# Patient Record
Sex: Male | Born: 1950
Health system: Southern US, Community
[De-identification: ages and names within clinical notes are randomized; demographics above are authoritative.]

## PROBLEM LIST (undated history)

## (undated) DIAGNOSIS — E278 Other specified disorders of adrenal gland: Secondary | ICD-10-CM

## (undated) DIAGNOSIS — T7840XA Allergy, unspecified, initial encounter: Secondary | ICD-10-CM

## (undated) DIAGNOSIS — E269 Hyperaldosteronism, unspecified: Secondary | ICD-10-CM

## (undated) DIAGNOSIS — E785 Hyperlipidemia, unspecified: Secondary | ICD-10-CM

## (undated) DIAGNOSIS — E119 Type 2 diabetes mellitus without complications: Secondary | ICD-10-CM

## (undated) DIAGNOSIS — F32A Depression, unspecified: Secondary | ICD-10-CM

## (undated) DIAGNOSIS — I5189 Other ill-defined heart diseases: Secondary | ICD-10-CM

## (undated) DIAGNOSIS — N183 Chronic kidney disease, stage 3 unspecified: Secondary | ICD-10-CM

## (undated) DIAGNOSIS — F329 Major depressive disorder, single episode, unspecified: Secondary | ICD-10-CM

## (undated) DIAGNOSIS — F419 Anxiety disorder, unspecified: Secondary | ICD-10-CM

## (undated) DIAGNOSIS — I471 Supraventricular tachycardia, unspecified: Secondary | ICD-10-CM

## (undated) DIAGNOSIS — Z87442 Personal history of urinary calculi: Secondary | ICD-10-CM

## (undated) DIAGNOSIS — K219 Gastro-esophageal reflux disease without esophagitis: Secondary | ICD-10-CM

## (undated) DIAGNOSIS — M109 Gout, unspecified: Secondary | ICD-10-CM

## (undated) DIAGNOSIS — N189 Chronic kidney disease, unspecified: Secondary | ICD-10-CM

## (undated) DIAGNOSIS — G4730 Sleep apnea, unspecified: Secondary | ICD-10-CM

## (undated) DIAGNOSIS — I10 Essential (primary) hypertension: Secondary | ICD-10-CM

## (undated) DIAGNOSIS — C801 Malignant (primary) neoplasm, unspecified: Secondary | ICD-10-CM

## (undated) DIAGNOSIS — R55 Syncope and collapse: Secondary | ICD-10-CM

## (undated) DIAGNOSIS — I872 Venous insufficiency (chronic) (peripheral): Secondary | ICD-10-CM

## (undated) DIAGNOSIS — M1990 Unspecified osteoarthritis, unspecified site: Secondary | ICD-10-CM

## (undated) DIAGNOSIS — Z9289 Personal history of other medical treatment: Secondary | ICD-10-CM

## (undated) DIAGNOSIS — J189 Pneumonia, unspecified organism: Secondary | ICD-10-CM

## (undated) HISTORY — PX: ARTHROSCOPIC REPAIR ACL: SUR80

## (undated) HISTORY — DX: Personal history of other medical treatment: Z92.89

## (undated) HISTORY — DX: Syncope and collapse: R55

## (undated) HISTORY — DX: Chronic kidney disease, unspecified: N18.9

## (undated) HISTORY — DX: Venous insufficiency (chronic) (peripheral): I87.2

## (undated) HISTORY — DX: Supraventricular tachycardia, unspecified: I47.10

## (undated) HISTORY — DX: Allergy, unspecified, initial encounter: T78.40XA

## (undated) HISTORY — DX: Hyperlipidemia, unspecified: E78.5

## (undated) HISTORY — PX: MOHS SURGERY: SUR867

## (undated) HISTORY — DX: Essential (primary) hypertension: I10

## (undated) HISTORY — DX: Type 2 diabetes mellitus without complications: E11.9

## (undated) HISTORY — PX: HERNIA REPAIR: SHX51

## (undated) HISTORY — DX: Other ill-defined heart diseases: I51.89

## (undated) HISTORY — PX: VASECTOMY: SHX75

## (undated) HISTORY — DX: Hyperaldosteronism, unspecified: E26.9

## (undated) HISTORY — DX: Major depressive disorder, single episode, unspecified: F32.9

## (undated) HISTORY — DX: Unspecified osteoarthritis, unspecified site: M19.90

## (undated) HISTORY — PX: JOINT REPLACEMENT: SHX530

## (undated) HISTORY — DX: Depression, unspecified: F32.A

---

## 2010-12-28 ENCOUNTER — Inpatient Hospital Stay: Payer: Self-pay | Admitting: Internal Medicine

## 2011-07-12 ENCOUNTER — Ambulatory Visit: Payer: Self-pay | Admitting: Family Medicine

## 2011-08-02 ENCOUNTER — Ambulatory Visit: Payer: Self-pay | Admitting: Family Medicine

## 2011-09-02 ENCOUNTER — Ambulatory Visit: Payer: Self-pay | Admitting: Family Medicine

## 2011-10-02 ENCOUNTER — Ambulatory Visit: Payer: Self-pay | Admitting: Family Medicine

## 2012-02-02 ENCOUNTER — Ambulatory Visit: Payer: Self-pay | Admitting: Gastroenterology

## 2012-09-18 ENCOUNTER — Ambulatory Visit: Payer: Self-pay | Admitting: Family Medicine

## 2012-12-11 ENCOUNTER — Ambulatory Visit: Payer: Self-pay | Admitting: Family Medicine

## 2013-04-03 DIAGNOSIS — C4491 Basal cell carcinoma of skin, unspecified: Secondary | ICD-10-CM

## 2013-04-03 HISTORY — DX: Basal cell carcinoma of skin, unspecified: C44.91

## 2013-12-24 DIAGNOSIS — Z85828 Personal history of other malignant neoplasm of skin: Secondary | ICD-10-CM | POA: Insufficient documentation

## 2014-04-22 DIAGNOSIS — E119 Type 2 diabetes mellitus without complications: Secondary | ICD-10-CM | POA: Diagnosis not present

## 2014-07-04 DIAGNOSIS — Z23 Encounter for immunization: Secondary | ICD-10-CM | POA: Insufficient documentation

## 2014-07-04 DIAGNOSIS — R05 Cough: Secondary | ICD-10-CM | POA: Insufficient documentation

## 2014-07-04 DIAGNOSIS — M109 Gout, unspecified: Secondary | ICD-10-CM | POA: Insufficient documentation

## 2014-07-04 DIAGNOSIS — E669 Obesity, unspecified: Secondary | ICD-10-CM

## 2014-07-04 DIAGNOSIS — F329 Major depressive disorder, single episode, unspecified: Secondary | ICD-10-CM | POA: Insufficient documentation

## 2014-07-04 DIAGNOSIS — F32A Depression, unspecified: Secondary | ICD-10-CM | POA: Insufficient documentation

## 2014-07-04 DIAGNOSIS — J309 Allergic rhinitis, unspecified: Secondary | ICD-10-CM | POA: Insufficient documentation

## 2014-07-04 DIAGNOSIS — K130 Diseases of lips: Secondary | ICD-10-CM | POA: Insufficient documentation

## 2014-07-04 DIAGNOSIS — I10 Essential (primary) hypertension: Secondary | ICD-10-CM | POA: Insufficient documentation

## 2014-07-04 DIAGNOSIS — R300 Dysuria: Secondary | ICD-10-CM | POA: Insufficient documentation

## 2014-07-04 DIAGNOSIS — M1990 Unspecified osteoarthritis, unspecified site: Secondary | ICD-10-CM | POA: Insufficient documentation

## 2014-07-04 DIAGNOSIS — E876 Hypokalemia: Secondary | ICD-10-CM | POA: Insufficient documentation

## 2014-07-04 DIAGNOSIS — R252 Cramp and spasm: Secondary | ICD-10-CM | POA: Insufficient documentation

## 2014-07-04 DIAGNOSIS — E7800 Pure hypercholesterolemia, unspecified: Secondary | ICD-10-CM | POA: Insufficient documentation

## 2014-07-04 DIAGNOSIS — M1710 Unilateral primary osteoarthritis, unspecified knee: Secondary | ICD-10-CM | POA: Insufficient documentation

## 2014-07-04 DIAGNOSIS — B309 Viral conjunctivitis, unspecified: Secondary | ICD-10-CM | POA: Insufficient documentation

## 2014-07-04 DIAGNOSIS — N390 Urinary tract infection, site not specified: Secondary | ICD-10-CM | POA: Insufficient documentation

## 2014-07-04 DIAGNOSIS — M2550 Pain in unspecified joint: Secondary | ICD-10-CM | POA: Insufficient documentation

## 2014-07-04 DIAGNOSIS — F1120 Opioid dependence, uncomplicated: Secondary | ICD-10-CM | POA: Insufficient documentation

## 2014-07-04 DIAGNOSIS — R509 Fever, unspecified: Secondary | ICD-10-CM | POA: Insufficient documentation

## 2014-07-04 DIAGNOSIS — R5381 Other malaise: Secondary | ICD-10-CM | POA: Insufficient documentation

## 2014-07-04 DIAGNOSIS — R5383 Other fatigue: Secondary | ICD-10-CM

## 2014-07-04 DIAGNOSIS — F419 Anxiety disorder, unspecified: Secondary | ICD-10-CM | POA: Insufficient documentation

## 2014-07-04 DIAGNOSIS — J209 Acute bronchitis, unspecified: Secondary | ICD-10-CM | POA: Insufficient documentation

## 2014-07-04 DIAGNOSIS — T148XXA Other injury of unspecified body region, initial encounter: Secondary | ICD-10-CM | POA: Insufficient documentation

## 2014-07-04 DIAGNOSIS — M5430 Sciatica, unspecified side: Secondary | ICD-10-CM | POA: Insufficient documentation

## 2014-07-04 DIAGNOSIS — R42 Dizziness and giddiness: Secondary | ICD-10-CM | POA: Insufficient documentation

## 2014-07-04 DIAGNOSIS — R310 Gross hematuria: Secondary | ICD-10-CM | POA: Insufficient documentation

## 2014-07-04 DIAGNOSIS — E1169 Type 2 diabetes mellitus with other specified complication: Secondary | ICD-10-CM | POA: Insufficient documentation

## 2014-07-04 DIAGNOSIS — M5136 Other intervertebral disc degeneration, lumbar region: Secondary | ICD-10-CM | POA: Insufficient documentation

## 2014-07-04 DIAGNOSIS — R053 Chronic cough: Secondary | ICD-10-CM | POA: Insufficient documentation

## 2014-07-04 DIAGNOSIS — T50905A Adverse effect of unspecified drugs, medicaments and biological substances, initial encounter: Secondary | ICD-10-CM

## 2014-07-24 ENCOUNTER — Ambulatory Visit
Admit: 2014-07-24 | Disposition: A | Discharge status: Home / Self Care | Payer: Self-pay | Attending: Urology | Admitting: Urology

## 2014-08-10 ENCOUNTER — Encounter
Admission: RE | Admit: 2014-08-10 | Discharge: 2014-08-10 | Disposition: A | Discharge status: Home / Self Care | Payer: Medicare PPO | Source: Ambulatory Visit | Attending: Urology | Admitting: Urology

## 2014-08-10 DIAGNOSIS — M1990 Unspecified osteoarthritis, unspecified site: Secondary | ICD-10-CM | POA: Insufficient documentation

## 2014-08-10 DIAGNOSIS — F329 Major depressive disorder, single episode, unspecified: Secondary | ICD-10-CM | POA: Diagnosis not present

## 2014-08-10 DIAGNOSIS — C009 Malignant neoplasm of lip, unspecified: Secondary | ICD-10-CM | POA: Diagnosis not present

## 2014-08-10 DIAGNOSIS — I10 Essential (primary) hypertension: Secondary | ICD-10-CM | POA: Diagnosis not present

## 2014-08-10 DIAGNOSIS — Z01812 Encounter for preprocedural laboratory examination: Secondary | ICD-10-CM | POA: Diagnosis not present

## 2014-08-10 DIAGNOSIS — E785 Hyperlipidemia, unspecified: Secondary | ICD-10-CM | POA: Diagnosis not present

## 2014-08-10 DIAGNOSIS — Z01810 Encounter for preprocedural cardiovascular examination: Secondary | ICD-10-CM | POA: Diagnosis not present

## 2014-08-10 DIAGNOSIS — E876 Hypokalemia: Secondary | ICD-10-CM | POA: Insufficient documentation

## 2014-08-10 HISTORY — DX: Gout, unspecified: M10.9

## 2014-08-10 HISTORY — DX: Anxiety disorder, unspecified: F41.9

## 2014-08-10 HISTORY — DX: Malignant (primary) neoplasm, unspecified: C80.1

## 2014-08-10 LAB — CBC
HCT: 43 % (ref 40.0–52.0)
Hemoglobin: 14.4 g/dL (ref 13.0–18.0)
MCH: 31.3 pg (ref 26.0–34.0)
MCHC: 33.5 g/dL (ref 32.0–36.0)
MCV: 93.3 fL (ref 80.0–100.0)
PLATELETS: 192 10*3/uL (ref 150–440)
RBC: 4.61 MIL/uL (ref 4.40–5.90)
RDW: 13.8 % (ref 11.5–14.5)
WBC: 6.3 10*3/uL (ref 3.8–10.6)

## 2014-08-10 LAB — BASIC METABOLIC PANEL
ANION GAP: 9 (ref 5–15)
BUN: 18 mg/dL (ref 6–20)
CHLORIDE: 103 mmol/L (ref 101–111)
CO2: 29 mmol/L (ref 22–32)
Calcium: 9.3 mg/dL (ref 8.9–10.3)
Creatinine, Ser: 0.75 mg/dL (ref 0.61–1.24)
GFR calc non Af Amer: >60 mL/min (ref >60)
Glucose, Bld: 224 mg/dL — ABNORMAL HIGH (ref 65–99)
Potassium: 3.6 mmol/L (ref 3.5–5.1)
SODIUM: 141 mmol/L (ref 135–145)

## 2014-08-10 LAB — DIFFERENTIAL
Basophils Absolute: 0 10*3/uL (ref 0–0.1)
Basophils Relative: 1 %
EOS PCT: 3 %
Eosinophils Absolute: 0.2 10*3/uL (ref 0–0.7)
LYMPHS PCT: 30 %
Lymphs Abs: 1.9 10*3/uL (ref 1.0–3.6)
MONO ABS: 0.4 10*3/uL (ref 0.2–1.0)
MONOS PCT: 6 %
NEUTROS ABS: 3.8 10*3/uL (ref 1.4–6.5)
Neutrophils Relative %: 60 %

## 2014-08-10 NOTE — Patient Instructions (Addendum)
  Your procedure is scheduled on:08/17/14 Report to Day Surgery. To find out your arrival time please call [phone] between 1PM - 3PM on 08/14/14.  Remember: Instructions that are not followed completely may result in serious medical risk, up to and including death, or upon the discretion of your surgeon and anesthesiologist your surgery may need to be rescheduled.    __x__ 1. Do not eat food or drink liquids after midnight. No gum chewing or hard candies.     _x_ 2. No Alcohol for 24 hours before or after surgery.   ____ 3. Bring all medications with you on the day of surgery if instructed.    __x__ 4. Notify your doctor if there is any change in your medical condition     (cold, fever, infections).     Do not wear jewelry, make-up, hairpins, clips or nail polish.  Do not wear lotions, powders, or perfumes. You may wear deodorant.  Do not shave 48 hours prior to surgery. Men may shave face and neck.  Do not bring valuables to the hospital.    [name] is not responsible for any belongings or valuables.               Contacts, dentures or bridgework may not be worn into surgery.  Leave your suitcase in the car. After surgery it may be brought to your room.  For patients admitted to the hospital, discharge time is determined by your                treatment team.   Patients discharged the day of surgery will not be allowed to drive home.   Please read over the following fact sheets that you were given:      ____ Take these medicines the morning of surgery with A SIP OF WATER:    1. metoprolol  2. amlodipine  3. fluoxetine  4.  5.  6.  ____ Fleet Enema (as directed)   ____ Use CHG Soap as directed  ____ Use inhalers on the day of surgery  _x___ Stop metformin 2 days prior to surgery    ____ Take 1/2 of usual insulin dose the night before surgery and none on the morning of surgery.   ____ Stop Coumadin/Plavix/aspirin on   _x___ Stop Anti-inflammatories on  now   ____ Stop supplements until after surgery.    ____ Bring C-Pap to the hospital.

## 2014-08-16 NOTE — H&P (Signed)
Evan Rivas. Evan Rivas 07/30/2014 7:42 AM Location: [facility] Patient #: [id number] DOB: 1950/06/30 Married / Language: Evan Rivas / Race: White Male    History of Present Illness(Shannon A Ernestine Conrad, PA-C; 07/30/2014 10:16 PM) The patient is a 64 year old male presenting to discuss diagnostic procedure results. The patient had a CT scan (CT Urogram). The diagnostic test was performed on - Date: (07/24/2014). Current symptoms include other (gross hematuria). There is no family history of breast cancer, cardiovascular disease, cystic fibrosis, Down's syndrome, mental retardation, myocardial infarction before age 13 or other. There is no medical history of anatomic urinary anomalies, asthma, cardiovascular disease, chest injury, chronic obstructive lung disease, congenital/chromosomal defects, coronary artery disease, emotional problems, hypertension, injury, lung disease, mental retardation, neurologic disorder, psychiatric illness or other. Note for "Follow up diagnostic procedure": REASON FOR EXAM: UROGRAM Hematuria work up Hematuria COMMENTS: PROCEDURE: KCT - KCT ABDOMEN/PELVIS W/WO - Jul 24 2014 8:48AM CLINICAL DATA: Gross painless hematuria for 2-3 weeks, decreased with antibiotics but with persistent microscopic hematuria EXAM: CT ABDOMEN AND PELVIS WITHOUT AND WITH CONTRAST TECHNIQUE: Multidetector CT imaging of the abdomen and pelvis was performed following the standard protocol before and following the bolus administration of intravenous contrast. Sagittal and coronal MPR images reconstructed from axial data set. CONTRAST: 125 cc Omnipaque 350 IV. Oral contrast not administered for this indication. COMPARISON: None FINDINGS: Subpleural atelectasis versus scarring at lung bases greater on RIGHT. Multiple BILATERAL nonobstructing renal calculi largest within RIGHT renal pelvis 8 mm diameter image 37. No ureteral calcification or dilatation. Small  cysts upper pole LEFT kidney 18 x 16 mm image 33 and lower pole 16 x 13 mm. Additional BILATERAL peripelvic renal cysts. Minimal dilatation of the RIGHT renal collecting system without ureteral dilatation. No enhancing renal mass lesion or collecting system filling defect. Bladder and ureters normal appearance. Liver, spleen, pancreas, and adrenal glands normal appearance. Normal appendix. LEFT inguinal hernia containing fat. Prior RIGHT inguinal herniorrhaphy. Stomach and bowel loops normal appearance for technique. No mass, adenopathy, free fluid, free air, or inflammatory process otherwise identified. No acute osseous findings. IMPRESSION: BILATERAL renal cysts. BILATERAL nonobstructing renal calculi. Minimal dilatation of the RIGHT renal collecting system without ureteral dilatation. LEFT inguinal hernia containing fat. Electronically Signed By: Lavonia Dana M.D.  I have reviewed the films with the patient.       Problem List/Past Medical(Sarah Watts; 07/30/2014 9:10 AM) Hypokalemia (276.8  E87.6) HTN (hypertension) (401.9  I10) Hematuria, gross (599.71  R31.0) Diabetes mellitus type 2 in obese (250.00  E11.9) Blues (311  F32.9) Skin cancer (173.90  C44.90) BPH with obstruction/lower urinary tract symptoms (600.01  N40.1) Obesity (BMI 30.0-34.9) (278.00  E66.9) Arthritis (716.90  M19.90) Sciatica (724.3  M54.30) Osteoarthritis of knee (715.36  M17.9) Pain, joint, multiple sites (719.49  M25.50) Conjunctivitis, viral (077.99  B30.9) Cough, persistent (786.2  R05) Other providers involved in care Malaise and fatigue (780.79  R53.81, R53.83) Muscle spasm (728.85  M62.838) Arthritis, degenerative (715.90  M19.90) Lip lesion (528.5  K13.0) Fever and chills (780.60  R50.9) Hypercholesteremia (272.0  E78.0) DDD (degenerative disc disease), lumbar (722.52  M51.36) Gout (274.9  M10.9) Anxiety (300.00  F41.1) Allergic rhinitis (477.9   J30.9) Biceps muscle tear (840.8  S46.119A) Chronic, continuous use of opioids (304.01  F11.20)    Allergies(Sarah Watts; 07/30/2014 9:10 AM) Tetanus Toxoid Adsorbed *TOXOIDS* CeleBREX *ANALGESICS - ANTI-INFLAMMATORY*. GI    Family History(Sarah Watts; 07/30/2014 9:10 AM) Stroke. Father. Sepsis. Mother.    Social History(Sarah Watts; 07/30/2014 9:10 AM)  Caffeine use Tobacco use. Never smoker. Alcohol use. Non-drinker.    Travel History(Sarah Watts; 07/30/2014 9:10 AM) Have you traveled internationally in the last 21 days?. No.    Medication History(Sarah Watts; 07/30/2014 9:10 AM) Tamsulosin HCl (0.4MG  Capsule, 1 (one) Capsule Oral one a day by mouth, Taken starting 07/09/2014) Active. Hydrocodone-Acetaminophen (10-325MG  Tablet, 1 Tablet Oral every eight hours, Taken starting 06/03/2014) Active. (Qty: 90; Medication taken as needed. Please fill on/after June 11, 2014) Losartan Potassium (100MG  Tablet, 1 Tablet Oral daily, Taken starting 06/03/2014) Active. (Qty: 90; Refills: 1) MetFORMIN HCl ER (500MG  Tablet ER 24HR, 2 Tablet ER 24HR Oral two times daily, Taken starting 06/03/2014) Active. (Qty: 360) Metoprolol Succinate ER (100MG  Tablet ER 24HR, 1 Tablet ER 24HR Oral daily, Taken starting 06/03/2014) Active. (Qty: 90; Refills: 1) Metoprolol Succinate ER (25MG  Tablet ER 24HR, 1 Tablet ER 24HR Oral daily, Taken starting 06/03/2014) Active. (Qty: 90) AmLODIPine Besylate (10MG  Tablet, 1 Tablet Oral daily, Taken starting 06/03/2014) Active. (Qty: 90) Atorvastatin Calcium (40MG  Tablet, 1 Tablet Oral at bedtime, Taken starting 06/03/2014) Active. (Qty: 90) Nabumetone (500MG  Tablet, 1 Tablet Oral two times daily, Taken starting 03/03/2014) Active. (Qty: 180; Refills: 1) Allopurinol (300MG  Tablet, 1 Tablet Oral daily, Taken starting 03/03/2014) Active. (Qty: 90; Refills: 1) ClonazePAM (1MG  Tablet, 1 Tablet Oral two times daily, Taken starting 03/03/2014) Active.  (Qty: 180; Refills: 1; Medication taken as needed.) FLUoxetine HCl (40MG  Capsule, 1 Capsule Oral daily, Taken starting 03/03/2014) Active. (Qty: 90; Refills: 1) Fluticasone Propionate (50MCG/ACT Suspension, 2 Spray(s) Nasal daily, Taken starting 05/03/2012) Active. (Qty: 3; Refills: 1) Aspirin EC (81MG  Tablet DR, 1 Oral daily) Active. TiZANidine HCl (4MG  Tablet, 1 Tablet Oral) Active. (Medication taken as needed. about once a month) Multivitamin ( Oral) Active. Medications Reconciled.    Past Surgical History(Sarah Watts; 07/30/2014 9:10 AM) KneeSurgery. 1995 Left. Vasectomy. 1998 Hernia Repair. 1998 Skin Cancer. 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 Normal. 1.0 ng/mL on 10/23/2011    Other Problems(Sarah Watts; 07/30/2014 9:10 AM) Influenza vaccine needed (V04.81  Z23) UTI (lower urinary tract infection) (599.0  N39.0) Acute bronchitis (466.0  J20.9) Dysuria (788.1  R30.0) Dizziness of unknown cause (780.4  R42)    Review of Systems(Sarah Watts; 07/30/2014 9:10 AM) General:Present- Fatigue. Not Present- Chills, Fever and Weight Gain > 10lbs.. Skin:Not Present- Hair Loss, Pruritus and Rash. HEENT:Not Present- Eye Pain, Decreased Hearing, Runny Nose, Snoring and Dry Mucous Membranes. Neck:Not Present- Neck Pain and Swollen Glands. Respiratory:Not Present- Chronic Cough, Difficulty Breathing on Exertion and Wakes up from Sleep Wheezing or Short of Breath. Breast:Not Present- Gynecomastia. Cardiovascular:Not Present- Edema, Palpitations and Shortness of Breath. Gastrointestinal:Not Present- Diarrhea, Nausea and Vomiting. Male Genitourinary:Present- Change in Urinary Stream, Frequency, Hematuria, Nocturia, Urgencyand Urine Leakage. Note:see HPI Musculoskeletal:Present- Back Pain and Joint Pain. Neurological:Not Present- Decreased Memory, Headaches and Seizures. Psychiatric:Present- Anxiety and  Depression. Endocrine:Not Present- Excessive Sweating, Heat Intolerance and Tired/Sluggish. Hematology:Not Present- Abnormal Bleeding, Anemia, Blood Transfusion and Enlarged Lymph Nodes.    Vitals(Sarah Watts; 07/30/2014 9:11 AM) 07/30/2014 9:10 AM Weight: 262.31 lb Height: 73 in Height was reported by patient. Body Surface Area: 2.48 m Body Mass Index: 34.61 kg/m Pulse: 77 (Regular) BP: 174/91 (Sitting, Left Arm, Standard)     Physical Exam(Shannon A McGowan, PA-C; 07/30/2014 10:17 PM) The physical exam findings are as follows:   General Mental Status- Alert. Posture- Normal posture.   Integumentary General Characteristics:Overall examination of the patient's skin reveals - no rashes.  Head and Neck Head- normocephalic, atraumatic with no lesions or palpable masses. Neck Global Assessment- non-tender and no lymphadenopathy. Thyroid Gland Characteristics- normal size and consistency.   Eye Pupil- Left- Normal, Direct reaction to light normal, Equal, Regular and Round. Right- Normal, Direct reaction to light normal, Equal, Regular and Round. Bilateral- Normal, Direct reaction to light normal, Equal, Regular and Round.   ENMT Mouth and Throat Oral Cavity/Oropharynx:Teeth- no dentures.   Chest and Lung Exam Chest and lung exam reveals - normal excursion with symmetric chest walls, quiet, even and easy respiratory effort with no use of accessory muscles and on auscultation, normal breath sounds, no adventitious sounds and normal vocal resonance.   Cardiovascular Auscultation:Rhythm- Regular. Heart Sounds- S1 WNL and S2 WNL. Carotid arteries- No Carotid bruit.   Abdomen Inspection:Inspection of the abdomen reveals - No Hernias. Palpation/Percussion:Palpation and Percussion of the abdomen reveal - Soft, Non Tender, No Rebound tenderness, No Rigidity (guarding) and No hepatosplenomegaly. Bladder- Non-palpable. Kidney  (Left):Other Characteristics- Non Tender. Kidney (Right):Other Characteristics- Non Tender. Auscultation:Auscultation of the abdomen reveals - Bowel sounds normal.   Male Genitourinary- Did not examine.   Rectal- Did not examine.   Neurologic Neurologic evaluation reveals - alert and oriented x 3 with no impairment of recent or remote memory.   Neuropsychiatric Examination of related systems reveals - The patient is well-nourished and well-groomed. The patient's mood and affect are described as - normal.   Musculoskeletal Global Assessment Right Lower Extremity- normal strength and tone and normal range of motion without pain. Left Lower Extremity- normal strength and tone and normal range of motion without pain.   Lymphatic General Lymphatics Description- Normal .    Assessment & Plan(Shannon A McGowan, PA-C; 07/30/2014 10:28 PM) Johney Maine hematuria (599.71  R31.0) Story: IMPRESSION: BILATERAL renal cysts. BILATERAL nonobstructing renal calculi. Minimal dilatation of the RIGHT renal collecting system without ureteral dilatation. LEFT inguinal hernia containing fat. Impression: Explained the findings of the CT Urogram to the patient. We discussed treatment options for the stone in his right renal pelvis, such as: ESWL, URS with laser lithotripsy with stent placement and PCNL. Current Plans l Pt Education - How to access health information online: discussed with patient and provided information. l URINALYSIS, AUTOMATED W/ MICRO (- [facility] -) (81001) l URINE CULTURE, COMPREHENSIVE (40981)  Kidney stone on right side (592.0  N20.0) Impression: Explained the findings of the CT Urogram to the patient. We discussed treatment options for the stone in his right renal pelvis, such as: ESWL, URS with laser lithotripsy with stent placement and PCNL. He would like to have URS with laser lithotripsy with stent placement. I have explained the risk of the  procedure, such as: Stent pain: About 50% of patients who undergo ureteroscopy and have a stent will have "stent pain," and this is by far the most common risk/complaint following ureteroscopy. A stent is a soft plastic tube (about half the size of IV tubing) that allows the kidney to drain to the bladder regardless of edema or obstruction. Not only can the stent "rub" on the inside of the bladder, causing a feeling of needing to urinate/overactive bladder, but also the stent allows urine to pass up from the bladder to the kidney during urination - causing symptoms from a warm, tingling sensation to intense pain in the affected flank. Stone fragments: Residual stones within the kidney or ureter may be present up to 40% of the time following ureteroscopy, depending on the original stone size and location. These stone fragments  will be seen and addressed on follow-up imaging. Ureteral injury: Injury to the ureter is the most common intra-operative complication during ureteroscopy. The reported risk of perforation ranges greatly, depending on whether it is defined as a complete perforation (0.1-0.7% - think of this as a hole through the entire ureter), a partial perforation (1.6% - a hole nearly through the entire ureter), or mucosal tear/scrape (5% - these are similar to a sore on the inside of the mouth). Almost 100% of these will heal with prolonged stenting (anywhere between 2 - 4 weeks). Should a large perforation occur, your urologist may chose to stop the procedure and return on another day when the ureter has had time to heal. I also explained the risks of general anesthesia, such as: MI, CVA, paralysis, coma and/or death.  BPH with obstruction/lower urinary tract symptoms (600.01  N40.1) Impression: Patient c/o of a post-void dribbling. His prostate was enlarged on DRE. We will start tamsulosin and he will RTC office in one month for an IPSS/PVR/PSA. He is advised of the side effects of tamsulosin,  such as: ejaculatory disorders, rhinitis and/or hypotension. He is instructed to take the tamsulosin 30 minutes after a meal once daily. Current Plans l PSA (PROSTATE SPECIFIC ANTIGEN) Serum [phone])

## 2014-08-17 ENCOUNTER — Ambulatory Visit
Admission: RE | Admit: 2014-08-17 | Discharge: 2014-08-17 | Disposition: A | Discharge status: Home / Self Care | Payer: Medicare PPO | Source: Ambulatory Visit | Attending: Urology | Admitting: Urology

## 2014-08-17 ENCOUNTER — Ambulatory Visit: Payer: Medicare PPO | Admitting: Certified Registered Nurse Anesthetist

## 2014-08-17 ENCOUNTER — Encounter
Admission: RE | Disposition: A | Discharge status: Home / Self Care | Payer: Self-pay | Source: Ambulatory Visit | Attending: Urology

## 2014-08-17 ENCOUNTER — Encounter: Payer: Self-pay | Admitting: *Deleted

## 2014-08-17 DIAGNOSIS — M5136 Other intervertebral disc degeneration, lumbar region: Secondary | ICD-10-CM | POA: Diagnosis not present

## 2014-08-17 DIAGNOSIS — E876 Hypokalemia: Secondary | ICD-10-CM | POA: Diagnosis not present

## 2014-08-17 DIAGNOSIS — I10 Essential (primary) hypertension: Secondary | ICD-10-CM | POA: Insufficient documentation

## 2014-08-17 DIAGNOSIS — M179 Osteoarthritis of knee, unspecified: Secondary | ICD-10-CM | POA: Diagnosis not present

## 2014-08-17 DIAGNOSIS — R300 Dysuria: Secondary | ICD-10-CM | POA: Insufficient documentation

## 2014-08-17 DIAGNOSIS — E780 Pure hypercholesterolemia: Secondary | ICD-10-CM | POA: Insufficient documentation

## 2014-08-17 DIAGNOSIS — Z823 Family history of stroke: Secondary | ICD-10-CM | POA: Insufficient documentation

## 2014-08-17 DIAGNOSIS — R310 Gross hematuria: Secondary | ICD-10-CM | POA: Insufficient documentation

## 2014-08-17 DIAGNOSIS — F329 Major depressive disorder, single episode, unspecified: Secondary | ICD-10-CM | POA: Insufficient documentation

## 2014-08-17 DIAGNOSIS — N390 Urinary tract infection, site not specified: Secondary | ICD-10-CM | POA: Insufficient documentation

## 2014-08-17 DIAGNOSIS — N281 Cyst of kidney, acquired: Secondary | ICD-10-CM | POA: Diagnosis not present

## 2014-08-17 DIAGNOSIS — F419 Anxiety disorder, unspecified: Secondary | ICD-10-CM | POA: Insufficient documentation

## 2014-08-17 DIAGNOSIS — J309 Allergic rhinitis, unspecified: Secondary | ICD-10-CM | POA: Insufficient documentation

## 2014-08-17 DIAGNOSIS — N401 Enlarged prostate with lower urinary tract symptoms: Secondary | ICD-10-CM | POA: Diagnosis not present

## 2014-08-17 DIAGNOSIS — N3943 Post-void dribbling: Secondary | ICD-10-CM | POA: Diagnosis not present

## 2014-08-17 DIAGNOSIS — E119 Type 2 diabetes mellitus without complications: Secondary | ICD-10-CM | POA: Diagnosis not present

## 2014-08-17 DIAGNOSIS — M109 Gout, unspecified: Secondary | ICD-10-CM | POA: Insufficient documentation

## 2014-08-17 DIAGNOSIS — Z6830 Body mass index (BMI) 30.0-30.9, adult: Secondary | ICD-10-CM | POA: Insufficient documentation

## 2014-08-17 DIAGNOSIS — N200 Calculus of kidney: Secondary | ICD-10-CM

## 2014-08-17 DIAGNOSIS — N359 Urethral stricture, unspecified: Secondary | ICD-10-CM | POA: Insufficient documentation

## 2014-08-17 DIAGNOSIS — E669 Obesity, unspecified: Secondary | ICD-10-CM | POA: Insufficient documentation

## 2014-08-17 DIAGNOSIS — K4090 Unilateral inguinal hernia, without obstruction or gangrene, not specified as recurrent: Secondary | ICD-10-CM | POA: Insufficient documentation

## 2014-08-17 HISTORY — PX: CYSTOSCOPY/URETEROSCOPY/HOLMIUM LASER/STENT PLACEMENT: SHX6546

## 2014-08-17 LAB — GLUCOSE, CAPILLARY
GLUCOSE-CAPILLARY: 226 mg/dL — AB (ref 65–99)
Glucose-Capillary: 201 mg/dL — ABNORMAL HIGH (ref 65–99)

## 2014-08-17 SURGERY — CYSTOSCOPY/URETEROSCOPY/HOLMIUM LASER/STENT PLACEMENT
Anesthesia: General | Laterality: Right | Wound class: Clean Contaminated

## 2014-08-17 MED ORDER — HYDROMORPHONE HCL 1 MG/ML IJ SOLN
0.2500 mg | INTRAMUSCULAR | Status: DC | PRN
  Administered 2014-08-17 (×2): 0.25 mg via INTRAVENOUS

## 2014-08-17 MED ORDER — ONDANSETRON HCL 4 MG/2ML IJ SOLN
INTRAMUSCULAR | Status: DC | PRN
  Administered 2014-08-17: 4 mg via INTRAVENOUS

## 2014-08-17 MED ORDER — OXYBUTYNIN CHLORIDE 5 MG PO TABS: 5.0000 mg | ORAL_TABLET | Freq: Three times a day (TID) | ORAL | Status: DC | PRN

## 2014-08-17 MED ORDER — FENTANYL CITRATE (PF) 100 MCG/2ML IJ SOLN
INTRAMUSCULAR | Status: AC
  Administered 2014-08-17: 25 ug via INTRAVENOUS
  Filled 2014-08-17: qty 2

## 2014-08-17 MED ORDER — FAMOTIDINE 20 MG PO TABS
ORAL_TABLET | ORAL | Status: AC
  Administered 2014-08-17: 20 mg via ORAL
  Filled 2014-08-17: qty 1

## 2014-08-17 MED ORDER — LIDOCAINE HCL (CARDIAC) 20 MG/ML IV SOLN
INTRAVENOUS | Status: DC | PRN
  Administered 2014-08-17: 50 mg via INTRAVENOUS

## 2014-08-17 MED ORDER — FENTANYL CITRATE (PF) 100 MCG/2ML IJ SOLN
INTRAMUSCULAR | Status: DC | PRN
  Administered 2014-08-17: 100 ug via INTRAVENOUS

## 2014-08-17 MED ORDER — SODIUM CHLORIDE 0.9 % IR SOLN
Status: DC | PRN
  Administered 2014-08-17: 2500 mL

## 2014-08-17 MED ORDER — FENTANYL CITRATE (PF) 100 MCG/2ML IJ SOLN
25.0000 ug | INTRAMUSCULAR | Status: DC | PRN
  Administered 2014-08-17 (×4): 25 ug via INTRAVENOUS

## 2014-08-17 MED ORDER — ONDANSETRON HCL 4 MG/2ML IJ SOLN: 4.0000 mg | Freq: Once | INTRAMUSCULAR | Status: DC | PRN

## 2014-08-17 MED ORDER — SODIUM CHLORIDE 0.9 % IV SOLN
INTRAVENOUS | Status: DC
  Administered 2014-08-17 (×3): via INTRAVENOUS

## 2014-08-17 MED ORDER — FAMOTIDINE 20 MG PO TABS
20.0000 mg | ORAL_TABLET | Freq: Once | ORAL | Status: AC
  Administered 2014-08-17: 20 mg via ORAL

## 2014-08-17 MED ORDER — PROPOFOL 10 MG/ML IV BOLUS
INTRAVENOUS | Status: DC | PRN
  Administered 2014-08-17: 250 mg via INTRAVENOUS

## 2014-08-17 MED ORDER — GLYCOPYRROLATE 0.2 MG/ML IJ SOLN
INTRAMUSCULAR | Status: DC | PRN
  Administered 2014-08-17: 0.2 mg via INTRAVENOUS

## 2014-08-17 MED ORDER — CEFAZOLIN SODIUM 1-5 GM-% IV SOLN
1.0000 g | INTRAVENOUS | Status: DC
Start: 2014-08-17 — End: 2014-08-17

## 2014-08-17 MED ORDER — HYDROCODONE-ACETAMINOPHEN 5-325 MG PO TABS: 1.0000 | ORAL_TABLET | Freq: Four times a day (QID) | ORAL | Status: DC | PRN

## 2014-08-17 MED ORDER — HYDROMORPHONE HCL 1 MG/ML IJ SOLN
INTRAMUSCULAR | Status: AC
  Administered 2014-08-17: 0.25 mg via INTRAVENOUS
  Filled 2014-08-17: qty 1

## 2014-08-17 MED ORDER — CEFAZOLIN SODIUM 1-5 GM-% IV SOLN
INTRAVENOUS | Status: AC
Start: 2014-08-17 — End: 2014-08-17
  Administered 2014-08-17: 1 g via INTRAVENOUS
  Filled 2014-08-17: qty 50

## 2014-08-17 MED ORDER — SUCCINYLCHOLINE CHLORIDE 20 MG/ML IJ SOLN
INTRAMUSCULAR | Status: DC | PRN
  Administered 2014-08-17: 20 mg via INTRAVENOUS

## 2014-08-17 SURGICAL SUPPLY — 33 items
ADAPTER SCOPE UROLOK II (MISCELLANEOUS) ×3 IMPLANT
BAG DRAIN CYSTO-URO LG1000N (MISCELLANEOUS) ×3 IMPLANT
BASKET ZERO TIP 1.9FR (BASKET) ×3 IMPLANT
BULB IRRIG PATHFIND (MISCELLANEOUS) ×3 IMPLANT
CATH URETL 5X70 OPEN END (CATHETERS) ×3 IMPLANT
CNTNR SPEC 2.5X3XGRAD LEK (MISCELLANEOUS) ×1
CONRAY 43 FOR UROLOGY 50M (MISCELLANEOUS) ×3 IMPLANT
CONT SPEC 4OZ STER OR WHT (MISCELLANEOUS) ×2
CONTAINER SPEC 2.5X3XGRAD LEK (MISCELLANEOUS) ×1 IMPLANT
GLOVE BIO SURGEON STRL SZ 6.5 (GLOVE) ×2 IMPLANT
GLOVE BIO SURGEON STRL SZ7 (GLOVE) ×6 IMPLANT
GLOVE BIO SURGEONS STRL SZ 6.5 (GLOVE) ×1
GOWN STRL REUS W/ TWL LRG LVL3 (GOWN DISPOSABLE) ×2 IMPLANT
GOWN STRL REUS W/TWL LRG LVL3 (GOWN DISPOSABLE) ×4
GUIDEWIRE SUPER STIFF (WIRE) ×3 IMPLANT
INTRODUCER DILATOR DOUBLE (INTRODUCER) ×3 IMPLANT
JELLY LUB 2OZ STRL (MISCELLANEOUS) ×2
JELLY LUBE 2OZ STRL (MISCELLANEOUS) ×1 IMPLANT
KIT RM TURNOVER CYSTO AR (KITS) ×3 IMPLANT
PACK CYSTO AR (MISCELLANEOUS) ×3 IMPLANT
PREP PVP WINGED SPONGE (MISCELLANEOUS) ×3 IMPLANT
PUMP SINGLE ACTION SAP (PUMP) ×3 IMPLANT
SENSORWIRE 0.038 NOT ANGLED (WIRE) ×6
SET CYSTO W/LG BORE CLAMP LF (SET/KITS/TRAYS/PACK) ×3 IMPLANT
SET DILATOR URETRAL 8.5FR (MISCELLANEOUS) ×3 IMPLANT
SHEATH URETERAL 12FRX35CM (MISCELLANEOUS) ×3 IMPLANT
SOL .9 NS 3000ML IRR  AL (IV SOLUTION) ×2
SOL .9 NS 3000ML IRR UROMATIC (IV SOLUTION) ×1 IMPLANT
STENT URET 6FRX24 CONTOUR (STENTS) ×3 IMPLANT
STENT URET 6FRX26 CONTOUR (STENTS) ×3 IMPLANT
TUBING PRES M/F 48IN 4237111 (TUBING) ×3 IMPLANT
WATER STERILE IRR 1000ML POUR (IV SOLUTION) ×3 IMPLANT
WIRE SENSOR 0.038 NOT ANGLED (WIRE) ×2 IMPLANT

## 2014-08-17 NOTE — Interval H&P Note (Signed)
History and Physical Interval Note:  08/17/2014 10:31 AM  Evan Rivas  has presented today for surgery, with the diagnosis of RIGHT RENAL STONE  The various methods of treatment have been discussed with the patient and family. After consideration of risks, benefits and other options for treatment, the patient has consented to  Procedure(s): CYSTOSCOPY/URETEROSCOPY/HOLMIUM LASER/STENT PLACEMENT (Right) as a surgical intervention .  The patient's history has been reviewed, patient examined, no change in status, stable for surgery.  I have reviewed the patient's chart and labs.  Questions were answered to the patient's satisfaction.     Hollice Espy

## 2014-08-17 NOTE — Anesthesia Procedure Notes (Signed)
Procedure Name: Intubation Date/Time: 08/17/2014 11:16 AM Performed by: Eliberto Ivory Pre-anesthesia Checklist: Patient identified, Patient being monitored, Timeout performed, Emergency Drugs available and Suction available Patient Re-evaluated:Patient Re-evaluated prior to inductionOxygen Delivery Method: Circle system utilized Preoxygenation: Pre-oxygenation with 100% oxygen Intubation Type: IV induction Ventilation: Mask ventilation without difficulty Laryngoscope Size: Mac and 3 Grade View: Grade II Tube type: Oral Tube size: 7.5 mm Number of attempts: 1 Placement Confirmation: ETT inserted through vocal cords under direct vision,  positive ETCO2 and breath sounds checked- equal and bilateral Secured at: 21 cm Tube secured with: Tape Dental Injury: Teeth and Oropharynx as per pre-operative assessment

## 2014-08-17 NOTE — Brief Op Note (Signed)
08/17/2014  12:14 PM  PATIENT:  Evan Rivas  64 y.o. male  PRE-OPERATIVE DIAGNOSIS:  RIGHT RENAL STONE, hematuria  POST-OPERATIVE DIAGNOSIS:  RIGHT RENAL STONE, hematuria  PROCEDURE:  Cystoscopy, right ureteroscopy, right ureteral stent placement  SURGEON:  Surgeon(s) and Role:    * Hollice Espy, MD - Primary  ASSISTANTS: none   ANESTHESIA:   general  EBL:  Total I/O In: 500 [I.V.:500] Out: -   Drains: 6x26 Fr JJ ureteral stent on right  Specimen: none  PLAN OF CARE: Discharge to home after PACU  PATIENT DISPOSITION:  PACU - hemodynamically stable.

## 2014-08-17 NOTE — Anesthesia Postprocedure Evaluation (Signed)
  Anesthesia Post-op Note  Patient: Evan Rivas  Procedure(s) Performed: Procedure(s): CYSTOSCOPY/URETEROSCOPY//STENT PLACEMENT/ attempt of lithotripsy (Right)  Anesthesia type:General  Patient location: PACU  Post pain: Pain level controlled  Post assessment: Post-op Vital signs reviewed, Patient's Cardiovascular Status Stable, Respiratory Function Stable, Patent Airway and No signs of Nausea or vomiting  Post vital signs: Reviewed and stable  Last Vitals:  Filed Vitals:   08/17/14 1245  BP:   Pulse: 64  Temp:   Resp: 12    Level of consciousness: awake, alert  and patient cooperative  Complications: No apparent anesthesia complications

## 2014-08-17 NOTE — Transfer of Care (Signed)
Immediate Anesthesia Transfer of Care Note  Patient: Evan Rivas  Procedure(s) Performed: Procedure(s): CYSTOSCOPY/URETEROSCOPY//STENT PLACEMENT/ attempt of lithotripsy (Right)  Patient Location: PACU  Anesthesia Type:General  Level of Consciousness: Alert, Awake, Oriented  Airway & Oxygen Therapy: Patient Spontanous Breathing  Post-op Assessment: Report given to RN  Post vital signs: Reviewed and stable  Last Vitals:  Filed Vitals:   08/17/14 1204  BP: 140/84  Pulse: 79  Temp: 36.2 C  Resp: 14    Complications: No apparent anesthesia complications

## 2014-08-17 NOTE — Discharge Instructions (Signed)
You have a ureteral stent in place.  This is a tube that extends from your kidney to your bladder.  This may cause urinary bleeding, burning with urination, and urinary frequency.  Please call our office or present to the ED if you develop fevers >101 or pain which is not able to be controlled with oral pain medications.  You may be given either Flomax and/ or ditropan to help with bladder spasms and stent pain in addition to pain medications.   ° °[facility] °[address], [address] °[place], [place] [address] °[phone] °General Anesthesia, Care After °Refer to this sheet in the next few weeks. These instructions provide you with information on caring for yourself after your procedure. Your health care provider may also give you more specific instructions. Your treatment has been planned according to current medical practices, but problems sometimes occur. Call your health care provider if you have any problems or questions after your procedure. °WHAT TO EXPECT AFTER THE PROCEDURE °After the procedure, it is typical to experience: °· Sleepiness. °· Nausea and vomiting. °HOME CARE INSTRUCTIONS °· For the first 24 hours after general anesthesia: °¨ Have a responsible person with you. °¨ Do not drive a car. If you are alone, do not take public transportation. °¨ Do not drink alcohol. °¨ Do not take medicine that has not been prescribed by your health care provider. °¨ Do not sign important papers or make important decisions. °¨ You may resume a normal diet and activities as directed by your health care provider. °· Change bandages (dressings) as directed. °· If you have questions or problems that seem related to general anesthesia, call the hospital and ask for the anesthetist or anesthesiologist on call. °SEEK MEDICAL CARE IF: °· You have nausea and vomiting that continue the day after anesthesia. °· You develop a rash. °SEEK IMMEDIATE MEDICAL CARE IF:  °· You have difficulty  breathing. °· You have chest pain. °· You have any allergic problems. °Document Released: 06/26/2000 Document Revised: 03/25/2013 Document Reviewed: 10/03/2012 °[employer]® Patient Information ©2015 [employer]. This information is not intended to replace advice given to you by your health care provider. Make sure you discuss any questions you have with your health care provider. ° °

## 2014-08-17 NOTE — Op Note (Signed)
Date of procedure: 08/17/2014  Preoperative diagnosis:  1. Right renal stones  2. Gross hematuria  Postoperative diagnosis:  1. Same as above  Procedure: 2. Cystoscopy  3. Right ureteroscopy (attempted) 4. Right ureteral stent placement  Surgeon: Hollice Espy, MD  Anesthesia: General  Complications: None  Intraoperative findings: Mild bulbourethral stricture.  Enlarged prostatic urethra with slight asymmetry/enlargement of the left lateral lobe. No bladder pathology. Unable to pass ureteroscope past level of the iliacs.   EBL: Minimal  Specimens: None  Drains: 6 x 26 French double-J ureteral stent on right  Indication: Evan Rivas is a 64 y.o. patient with episode of painless gross hematuria who underwent hematuria workup found to have multiple bilateral nonobstructing stones, right greater than left. He is counseled on the various treatment options and would like to undergo cystoscopy as well as treatment of his right ureteral stones via ureteroscopy.  After reviewing the management options for treatment, he elected to proceed with the above surgical procedure(s). We have discussed the potential benefits and risks of the procedure, side effects of the proposed treatment, the likelihood of the patient achieving the goals of the procedure, and any potential problems that might occur during the procedure or recuperation.  We also discussed the risk of inability to pass the ureteroscope and need for a second procedure. Informed consent has been obtained.  Description of procedure:  The patient was taken to the operating room and general anesthesia was induced.  The patient was placed in the dorsal lithotomy position, prepped and draped in the usual sterile fashion, and preoperative antibiotics were administered. A preoperative time-out was performed.   At this point in time, a rigid 62 French cystoscope was advanced per urethra into the bladder. Within the bulbar urethra, a mild  bulbar urethral stricture was encountered and a 5 [place] open-ended was passed beyond this. The scope was then able to easily be passed through this mild stricture. Examination of the prostatic fossa revealed bilobar coaptation with slight asymmetry and enlargement of the right lateral lobe. The bladder itself was then expected carefully at which time no bladder pathology including no stones, ulcerations, or tumors were identified. The trigone was normal bilateral ureteral orifices were identified in the normal anatomic position. Attention was then turned to the right ureteral orifice which was cannulated using a 5 [place] open-ended ureteral catheter and a sensor wire. The sensor wire was able to placed up to level of the kidney without difficulty. A dual lumen catheter was used just at the level of the distal ureter under fluoroscopic guidance and a second Super Stiff wire was advanced level of the kidney.  At this point in time, and attempted to advance the 10 French flexible ureteroscope over the Super Stiff wire but met resistance at within the distal ureter near the level of the iliacs. I then attempted to passively dilate this area first by advancing a 5 [place] open-ended ureteral catheter over the wire which went without difficulty. I then attempted to passively dilate using an 8-10 dilator and the 8 French sheath was able to pass beyond this area but met resistance with a 10 [place] outer lumen at the same level or a difficulty with the scope. I tried one last time to advance the scope this time under direct visualization within this area. The lumen of the ureter was noted to be patent here but slightly concentrically narrow in the scope did not go easily.In order to avoid any significant trauma or scar tissue formation of  the ureter, I elected to go ahead and just place a stent and return on a later date for staged procedure.  The sensor safety wire was backloaded into the rigid cystoscope and a 6 x 26  French double-J ureteral stent was advanced over this wire to the level of the renal pelvis. The wire was partially withdrawn and adequate coil was noted within the renal pelvis. The wire was then fully withdrawn and a coil was noted within the bladder. The bladder was then drained.  At this point in time the patient was reversed from anesthesia after being repositioned in the supine position and taken to the PACU in stable condition. There were no complications in this case other than inability to complete the lithotripsy portion of the procedure.  Plan: We will rebook this procedure as a staged procedure in one to 2 weeks to treat the stones definitively. I discussed the plan both with the patient and the family who were agreeable.  Hollice Espy, M.D.

## 2014-08-17 NOTE — Anesthesia Preprocedure Evaluation (Signed)
Anesthesia Evaluation  Patient identified by MRN, date of birth, ID band Patient awake    Reviewed: Allergy & Precautions, NPO status , Patient's Chart, lab work & pertinent test results, reviewed documented beta blocker date and time   Airway Mallampati: II  TM Distance: >3 FB Neck ROM: Full   Comment: Large neck.    Possible difficult intubation Dental  (+) Chipped   Pulmonary  Bronchitis in past breath sounds clear to auscultation  Pulmonary exam normal       Cardiovascular hypertension, Rhythm:Regular Rate:Normal     Neuro/Psych Anxiety Depression  Neuromuscular disease    GI/Hepatic negative GI ROS, Neg liver ROS,   Endo/Other  diabetes, Well Controlled, Type 2  Renal/GU negative Renal ROS  negative genitourinary   Musculoskeletal  (+) Arthritis -, Osteoarthritis,    Abdominal (+) + obese,   Peds negative pediatric ROS (+)  Hematology   Anesthesia Other Findings   Reproductive/Obstetrics                             Anesthesia Physical Anesthesia Plan  ASA: III  Anesthesia Plan: General   Post-op Pain Management:    Induction: Intravenous  Airway Management Planned: Oral ETT  Additional Equipment:   Intra-op Plan:   Post-operative Plan: Extubation in OR  Informed Consent: I have reviewed the patients History and Physical, chart, labs and discussed the procedure including the risks, benefits and alternatives for the proposed anesthesia with the patient or authorized representative who has indicated his/her understanding and acceptance.   Dental advisory given  Plan Discussed with: CRNA and Surgeon  Anesthesia Plan Comments:         Anesthesia Quick Evaluation

## 2014-08-17 NOTE — Progress Notes (Signed)
Fingerstick at 12:10   226

## 2014-08-18 ENCOUNTER — Encounter: Payer: Self-pay | Admitting: Urology

## 2014-08-28 ENCOUNTER — Other Ambulatory Visit: Payer: Self-pay | Admitting: *Deleted

## 2014-08-28 ENCOUNTER — Encounter: Payer: Self-pay | Admitting: *Deleted

## 2014-09-02 ENCOUNTER — Inpatient Hospital Stay: Admission: RE | Admit: 2014-09-02 | Payer: Medicare PPO | Source: Ambulatory Visit

## 2014-09-02 ENCOUNTER — Encounter: Payer: Self-pay | Admitting: *Deleted

## 2014-09-02 DIAGNOSIS — E669 Obesity, unspecified: Secondary | ICD-10-CM | POA: Diagnosis not present

## 2014-09-02 DIAGNOSIS — Z85828 Personal history of other malignant neoplasm of skin: Secondary | ICD-10-CM | POA: Diagnosis not present

## 2014-09-02 DIAGNOSIS — E780 Pure hypercholesterolemia: Secondary | ICD-10-CM | POA: Diagnosis not present

## 2014-09-02 DIAGNOSIS — E119 Type 2 diabetes mellitus without complications: Secondary | ICD-10-CM | POA: Diagnosis not present

## 2014-09-02 DIAGNOSIS — Z791 Long term (current) use of non-steroidal anti-inflammatories (NSAID): Secondary | ICD-10-CM | POA: Diagnosis not present

## 2014-09-02 DIAGNOSIS — Z888 Allergy status to other drugs, medicaments and biological substances status: Secondary | ICD-10-CM | POA: Diagnosis not present

## 2014-09-02 DIAGNOSIS — R310 Gross hematuria: Secondary | ICD-10-CM | POA: Diagnosis not present

## 2014-09-02 DIAGNOSIS — M5136 Other intervertebral disc degeneration, lumbar region: Secondary | ICD-10-CM | POA: Diagnosis not present

## 2014-09-02 DIAGNOSIS — Z886 Allergy status to analgesic agent status: Secondary | ICD-10-CM | POA: Diagnosis not present

## 2014-09-02 DIAGNOSIS — Z79899 Other long term (current) drug therapy: Secondary | ICD-10-CM | POA: Diagnosis not present

## 2014-09-02 DIAGNOSIS — M1990 Unspecified osteoarthritis, unspecified site: Secondary | ICD-10-CM | POA: Diagnosis not present

## 2014-09-02 DIAGNOSIS — M109 Gout, unspecified: Secondary | ICD-10-CM | POA: Diagnosis not present

## 2014-09-02 DIAGNOSIS — M179 Osteoarthritis of knee, unspecified: Secondary | ICD-10-CM | POA: Diagnosis not present

## 2014-09-02 DIAGNOSIS — Z7982 Long term (current) use of aspirin: Secondary | ICD-10-CM | POA: Diagnosis not present

## 2014-09-02 DIAGNOSIS — Z79891 Long term (current) use of opiate analgesic: Secondary | ICD-10-CM | POA: Diagnosis not present

## 2014-09-02 DIAGNOSIS — Z887 Allergy status to serum and vaccine status: Secondary | ICD-10-CM | POA: Diagnosis not present

## 2014-09-02 DIAGNOSIS — N200 Calculus of kidney: Secondary | ICD-10-CM | POA: Diagnosis not present

## 2014-09-02 DIAGNOSIS — J309 Allergic rhinitis, unspecified: Secondary | ICD-10-CM | POA: Diagnosis not present

## 2014-09-02 NOTE — Patient Instructions (Signed)
  Your procedure is scheduled on: 09-11-14 Report to [facility] To find out your arrival time please call [phone] between 1PM - 3PM on 09-04-14  Remember: Instructions that are not followed completely may result in serious medical risk, up to and including death, or upon the discretion of your surgeon and anesthesiologist your surgery may need to be rescheduled.    __X__ 1. Do not eat food or drink liquids after midnight. No gum chewing or hard candies.     __X__ 2. No Alcohol for 24 hours before or after surgery.   ____ 3. Bring all medications with you on the day of surgery if instructed.    ____ 4. Notify your doctor if there is any change in your medical condition     (cold, fever, infections).     Do not wear jewelry, make-up, hairpins, clips or nail polish.  Do not wear lotions, powders, or perfumes. You may wear deodorant.  Do not shave 48 hours prior to surgery. Men may shave face and neck.  Do not bring valuables to the hospital.    [facility] is not responsible for any belongings or valuables.               Contacts, dentures or bridgework may not be worn into surgery.  Leave your suitcase in the car. After surgery it may be brought to your room.  For patients admitted to the hospital, discharge time is determined by your   treatment team.   Patients discharged the day of surgery will not be allowed to drive home.   Please read over the following fact sheets that you were given:      _X___ Take these medicines the morning of surgery with A SIP OF WATER:    1.AMLODIPINE  2.ALLOPURINOL   3. PROZAC  4.METOPROLOL  5.  6.  ____ Fleet Enema (as directed)   ____ Use CHG Soap as directed  ____ Use inhalers on the day of surgery  _X___ Stop metformin 2 days prior to surgery-LAST DOSE Friday 09-04-14    ____ Take 1/2 of usual insulin dose the night before surgery and none on the morning of surgery.   __X__ Stop  Coumadin/Plavix/aspirin-PT STOPPED ASA LAST MONTH   __X__ Stop Anti-inflammatories-PT STOPPED NABUMETONE ON 08-30-14-OK TO CONTINUE HYDROCODONE    ____ Stop supplements until after surgery.    ____ Bring C-Pap to the hospital.

## 2014-09-03 ENCOUNTER — Ambulatory Visit (INDEPENDENT_AMBULATORY_CARE_PROVIDER_SITE_OTHER): Payer: Medicare PPO | Admitting: Family Medicine

## 2014-09-03 ENCOUNTER — Other Ambulatory Visit: Payer: Self-pay | Admitting: Family Medicine

## 2014-09-03 ENCOUNTER — Encounter: Payer: Self-pay | Admitting: Family Medicine

## 2014-09-03 VITALS — BP 140/80 | HR 75 | Resp 16 | Ht 74.25 in | Wt 261.0 lb

## 2014-09-03 DIAGNOSIS — M25561 Pain in right knee: Secondary | ICD-10-CM | POA: Diagnosis not present

## 2014-09-03 DIAGNOSIS — I10 Essential (primary) hypertension: Secondary | ICD-10-CM | POA: Diagnosis not present

## 2014-09-03 DIAGNOSIS — M1A9XX0 Chronic gout, unspecified, without tophus (tophi): Secondary | ICD-10-CM

## 2014-09-03 DIAGNOSIS — E119 Type 2 diabetes mellitus without complications: Secondary | ICD-10-CM

## 2014-09-03 DIAGNOSIS — E1122 Type 2 diabetes mellitus with diabetic chronic kidney disease: Secondary | ICD-10-CM | POA: Insufficient documentation

## 2014-09-03 DIAGNOSIS — E785 Hyperlipidemia, unspecified: Secondary | ICD-10-CM | POA: Diagnosis not present

## 2014-09-03 DIAGNOSIS — G8929 Other chronic pain: Secondary | ICD-10-CM | POA: Diagnosis not present

## 2014-09-03 DIAGNOSIS — M25562 Pain in left knee: Secondary | ICD-10-CM

## 2014-09-03 DIAGNOSIS — M545 Low back pain: Secondary | ICD-10-CM

## 2014-09-03 DIAGNOSIS — F331 Major depressive disorder, recurrent, moderate: Secondary | ICD-10-CM | POA: Diagnosis not present

## 2014-09-03 DIAGNOSIS — E1169 Type 2 diabetes mellitus with other specified complication: Secondary | ICD-10-CM | POA: Insufficient documentation

## 2014-09-03 MED ORDER — METOPROLOL SUCCINATE ER 25 MG PO TB24: 25.0000 mg | ORAL_TABLET | ORAL | Status: DC

## 2014-09-03 MED ORDER — AMLODIPINE BESYLATE 10 MG PO TABS: 10.0000 mg | ORAL_TABLET | Freq: Every day | ORAL | Status: DC

## 2014-09-03 MED ORDER — FLUOXETINE HCL 40 MG PO CAPS: 40.0000 mg | ORAL_CAPSULE | ORAL | Status: DC

## 2014-09-03 MED ORDER — ATORVASTATIN CALCIUM 40 MG PO TABS: 40.0000 mg | ORAL_TABLET | Freq: Every day | ORAL | Status: DC

## 2014-09-03 MED ORDER — NABUMETONE 500 MG PO TABS: 500.0000 mg | ORAL_TABLET | Freq: Two times a day (BID) | ORAL | Status: DC

## 2014-09-03 MED ORDER — ALLOPURINOL 300 MG PO TABS: 300.0000 mg | ORAL_TABLET | Freq: Every day | ORAL | Status: DC

## 2014-09-03 MED ORDER — METFORMIN HCL ER 500 MG PO TB24: ORAL_TABLET | ORAL | Status: DC

## 2014-09-03 MED ORDER — HYDROCODONE-ACETAMINOPHEN 10-325 MG PO TABS: 1.0000 | ORAL_TABLET | Freq: Three times a day (TID) | ORAL | Status: DC | PRN

## 2014-09-03 NOTE — Progress Notes (Signed)
Subjective:    Patient ID: Evan Rivas, male    DOB: 1951/01/25, 64 y.o.   MRN: [id number]  Hypertension This is a chronic problem. The problem is controlled. Pertinent negatives include no blurred vision, chest pain, headaches, neck pain, orthopnea, palpitations or peripheral edema. Risk factors for coronary artery disease include diabetes mellitus, dyslipidemia, male gender and obesity. Past treatments include beta blockers, calcium channel blockers and angiotensin blockers. The current treatment provides significant improvement. There are no compliance problems.  There is no history of kidney disease, CAD/MI, CVA or heart failure.  Diabetes He presents for his follow-up diabetic visit. He has type 2 diabetes mellitus. His disease course has been stable. Pertinent negatives for hypoglycemia include no headaches. Associated symptoms include fatigue. Pertinent negatives for diabetes include no blurred vision, no chest pain, no foot ulcerations, no polydipsia, no polyphagia, no polyuria and no weakness. Symptoms are stable. Pertinent negatives for diabetic complications include no CVA. Risk factors for coronary artery disease include diabetes mellitus and dyslipidemia. His weight is stable. He is following a diabetic diet. He participates in exercise daily. He monitors blood glucose at home 1-2 x per day. Blood glucose monitoring compliance is excellent. An ACE inhibitor/angiotensin II receptor blocker is being taken. He does not see a podiatrist.Eye exam is current.  Hyperlipidemia This is a chronic problem. The problem is controlled. Exacerbating diseases include diabetes and obesity. He has no history of liver disease. There are no known factors aggravating his hyperlipidemia. Pertinent negatives include no chest pain, leg pain or myalgias. Current antihyperlipidemic treatment includes statins. There are no compliance problems.  Risk factors for coronary artery disease include diabetes mellitus,  dyslipidemia, hypertension, male sex and obesity.  Back Pain This is a chronic problem. The problem occurs daily. The pain is present in the lumbar spine. The quality of the pain is described as aching. The pain does not radiate. The pain is at a severity of 7/10. The pain is moderate. The symptoms are aggravated by bending. Pertinent negatives include no bladder incontinence, bowel incontinence, chest pain, fever, headaches, leg pain, numbness or weakness. He has tried muscle relaxant and NSAIDs for the symptoms. The treatment provided moderate relief.  Arthritis Presents for follow-up visit. He complains of pain and stiffness. He reports no joint swelling. The symptoms have been stable. Affected locations include the left knee and right knee. His pain is at a severity of 7/10. Associated symptoms include fatigue. Pertinent negatives include no fever. Compliance with medications is 76-100%.      Review of Systems  Constitutional: Positive for fatigue. Negative for fever and chills.  Eyes: Negative for blurred vision.  Cardiovascular: Negative for chest pain, palpitations, orthopnea and leg swelling.  Gastrointestinal: Negative for bowel incontinence.  Endocrine: Negative for polydipsia, polyphagia and polyuria.  Genitourinary: Negative for bladder incontinence.  Musculoskeletal: Positive for back pain, arthralgias, arthritis and stiffness. Negative for myalgias, joint swelling and neck pain.  Neurological: Negative for weakness, numbness and headaches.    Past Medical History  Diagnosis Date  . Arthritis   . Hypertension   . Hyperlipidemia   . Depression   . Diabetes mellitus without complication   . Anxiety     panic attacks  . Cancer     lip  . Gout    Past Surgical History  Procedure Laterality Date  . Hernia repair    . Arthroscopic repair acl Left   . Mohs surgery      lip  . Cystoscopy/ureteroscopy/holmium laser/stent placement  Right 08/17/2014    Procedure:  CYSTOSCOPY/URETEROSCOPY//STENT PLACEMENT/ attempt of lithotripsy;  Surgeon: Hollice Espy, MD;  Location: [facility] ORS;  Service: Urology;  Laterality: Right;   Family History  Problem Relation Age of Onset  . Hypertension Mother   . Cancer Mother   . Stroke Father   . Depression Sister    History   Social History  . Marital Status: Married    Spouse Name: N/A  . Number of Children: N/A  . Years of Education: N/A   Occupational History  . Not on file.   Social History Main Topics  . Smoking status: Never Smoker   . Smokeless tobacco: Never Used  . Alcohol Use: No  . Drug Use: No  . Sexual Activity: Yes   Other Topics Concern  . Not on file   Social History Narrative         Objective:   Physical Exam  Constitutional: He is oriented to person, place, and time. He appears well-developed and well-nourished.  Cardiovascular: Normal rate, regular rhythm and normal heart sounds.   Pulmonary/Chest: Effort normal and breath sounds normal.  Musculoskeletal:       Right knee: He exhibits normal range of motion, no swelling, no effusion and no deformity.       Left knee: He exhibits normal range of motion, no swelling, no effusion, no deformity and no erythema.       Lumbar back: He exhibits tenderness. He exhibits no swelling and no pain.  Neurological: He is alert and oriented to person, place, and time. No sensory deficit.  Diabetic foot exam: intact sensation on feet bilaterally, normal appearance of feet.   Diabetic Foot Exam - Simple   Simple Foot Form  Diabetic Foot exam was performed with the following findings:  Yes 09/03/2014 10:11 AM  Visual Inspection  No deformities, no ulcerations, no other skin breakdown bilaterally:  Yes  Sensation Testing  Intact to touch and monofilament testing bilaterally:  Yes  Pulse Check  Comments           Assessment & Plan:   1. Chronic gout without tophus, unspecified cause, unspecified site  - allopurinol (ZYLOPRIM) 300 MG  tablet; Take 1 tablet (300 mg total) by mouth daily.  Dispense: 90 tablet; Refill: 0  2. Essential hypertension  - amLODipine (NORVASC) 10 MG tablet; Take 1 tablet (10 mg total) by mouth daily.  Dispense: 90 tablet; Refill: 1 - metoprolol succinate (TOPROL-XL) 25 MG 24 hr tablet; Take 1 tablet (25 mg total) by mouth every morning.  Dispense: 90 tablet; Refill: 1  3. Hyperlipidemia  - atorvastatin (LIPITOR) 40 MG tablet; Take 1 tablet (40 mg total) by mouth at bedtime. At Bedtime  Dispense: 90 tablet; Refill: 1 - Lipid Profile  4. Major depressive disorder, recurrent episode, moderate  - FLUoxetine (PROZAC) 40 MG capsule; Take 1 capsule (40 mg total) by mouth every morning.  Dispense: 90 capsule; Refill: 1  5. Chronic low back pain Pain is controlled on present therapy. Pt. Compliant with Controlled SUbstances contract. Refills provided. - HYDROcodone-acetaminophen (NORCO) 10-325 MG per tablet; Take 1 tablet by mouth 3 (three) times daily as needed for moderate pain. 10-325  Dispense: 90 tablet; Refill: 0 - nabumetone (RELAFEN) 500 MG tablet; Take 1 tablet (500 mg total) by mouth 2 (two) times daily.  Dispense: 180 tablet; Refill: 1  6. Arthralgia of both knees  - HYDROcodone-acetaminophen (NORCO) 10-325 MG per tablet; Take 1 tablet by mouth 3 (three) times daily as  needed for moderate pain. 10-325  Dispense: 90 tablet; Refill: 0 - nabumetone (RELAFEN) 500 MG tablet; Take 1 tablet (500 mg total) by mouth 2 (two) times daily.  Dispense: 180 tablet; Refill: 1  7. Type 2 diabetes mellitus without complication  - metFORMIN (GLUCOPHAGE-XR) 500 MG 24 hr tablet; 1000mg  BID  Dispense: 360 tablet; Refill: 1 - HgB A1c

## 2014-09-04 LAB — LIPID PANEL W/O CHOL/HDL RATIO
Cholesterol, Total: 136 mg/dL (ref 100–199)
HDL: 41 mg/dL (ref >39)
LDL Calculated: 76 mg/dL (ref 0–99)
TRIGLYCERIDES: 96 mg/dL (ref 0–149)
VLDL Cholesterol Cal: 19 mg/dL (ref 5–40)

## 2014-09-04 LAB — HGB A1C W/O EAG: Hgb A1c MFr Bld: 7.5 % — ABNORMAL HIGH (ref 4.8–5.6)

## 2014-09-06 NOTE — H&P (Signed)
Evan Rivas. Lumm 08/24/2014 8:28 AM Location: [facility] Patient #: [id number] DOB: [birth year] Married / Language: Evan Rivas / Race: White Male    History of Present Evan Antigua, MD; 08/24/2014 10:32 AM) The patient is a 64 year old male presenting for a post-operative visit. Patient is 1 week postop procedure. Note for "Post-Operative": 30-yearr-old male with bilateral nephrolithiasis R>L Who was taken to the OR for treatment of his right-sided stones on 08/17/2014. Unfortunately, the scope was unable to be advanced easily up the RIGHT ureter at which time a stent was placed. He was rebooked for surgery in a few weeks but presents today because he has some questions about the upcoming surgery as well as his Physical limitations.  She notes that after surgery, he had some soreness in bilateral hips but does have some fairly severe arthritis. This is since resolved. He also reports some mild flank pain with voiding as well as some RIGHT testicular pain but this is not severe. He is taking Flomax.  He has noted that his blood sugars have been a little bit more elevated since surgery. No fevers or chills.      Problem List/Past Medical(Evan Rivas; 08/24/2014 10:15 AM) Arthritis, degenerative (715.90  M19.90) Muscle spasm (728.85  M62.838) Malaise and fatigue (780.79  R53.81, R53.83) Hypercholesteremia (272.0  E78.0) Fever and chills (780.60  R50.9) Lip lesion (528.5  K13.0) Other providers involved in care Osteoarthritis of knee (715.36  M17.9) Sciatica (724.3  M54.30) Cough, persistent (786.2  R05) Conjunctivitis, viral (077.99  B30.9) Pain, joint, multiple sites (719.49  M25.50) DDD (degenerative disc disease), lumbar (722.52  M51.36) Gross hematuria (599.71  R31.0). IMPRESSION: BILATERAL renal cysts. BILATERAL nonobstructing renal calculi. Minimal dilatation of the RIGHT renal collecting system without ureteral  dilatation. LEFT inguinal hernia containing fat. Kidney stone on right side (592.0  N20.0) Hypertension (401.9  I10) Obesity (BMI 30.0-34.9) (278.00  E66.9) BPH with obstruction/lower urinary tract symptoms (600.01  N40.1) Chronic, continuous use of opioids (304.01  F11.20) Gout (274.9  M10.9) Biceps muscle tear (840.8  S46.119A) Allergic rhinitis (477.9  J30.9) Anxiety (300.00  F41.1) Hypokalemia (276.8  E87.6) Depression (311  F32.9) Diabetes mellitus (250.00  E11.9) Arthritis (716.90  M19.90) Skin cancer (173.90  C44.90) Post-operative state (V45.89  Z98.89)    Allergies(Evan Rivas; 08/24/2014 10:10 AM) CeleBREX *ANALGESICS - ANTI-INFLAMMATORY*. GI Tetanus Toxoid Adsorbed *TOXOIDS*    Family History(Evan Rivas; 08/24/2014 10:14 AM) Sepsis. Mother. Stroke. Father. Prostate Cancer. Negative Family History Of.    Social History(Evan Rivas; 08/24/2014 10:10 AM) Alcohol use. Non-drinker. Tobacco use. Never smoker. Caffeine use    Travel History(Evan Rivas; 08/24/2014 10:10 AM) Have you traveled internationally in the last 21 days?. No.    Medication History(Evan Rivas; 08/24/2014 10:15 AM) AmLODIPine Besylate (10MG  Tablet, 1 Tablet Oral daily, Taken starting 06/03/2014) Active. (Qty: 90) Tamsulosin HCl (0.4MG  Capsule, 1 (one) Capsule Oral one a day by mouth, Taken starting 07/09/2014) Active. Hydrocodone-Acetaminophen (10-325MG  Tablet, 1 Tablet Oral every eight hours, Taken starting 06/03/2014) Active. (Qty: 90; Medication taken as needed. Please fill on/after June 11, 2014) Losartan Potassium (100MG  Tablet, 1 Tablet Oral daily, Taken starting 06/03/2014) Active. (Qty: 90; Refills: 1) MetFORMIN HCl ER (500MG  Tablet ER 24HR, 2 Tablet ER 24HR Oral two times daily, Taken starting 06/03/2014) Active. (Qty: 360) Metoprolol Succinate ER (100MG  Tablet ER 24HR, 1 Tablet ER 24HR Oral daily, Taken starting 06/03/2014) Active.  (Qty: 90; Refills: 1) Metoprolol Succinate ER (25MG  Tablet ER 24HR, 1 Tablet ER 24HR Oral daily, Taken  starting 06/03/2014) Active. (Qty: 90) Atorvastatin Calcium (40MG  Tablet, 1 Tablet Oral at bedtime, Taken starting 06/03/2014) Active. (Qty: 90) Nabumetone (500MG  Tablet, 1 Tablet Oral two times daily, Taken starting 03/03/2014) Active. (Qty: 180; Refills: 1) Allopurinol (300MG  Tablet, 1 Tablet Oral daily, Taken starting 03/03/2014) Active. (Qty: 90; Refills: 1) ClonazePAM (1MG  Tablet, 1 Tablet Oral two times daily, Taken starting 03/03/2014) Active. (Qty: 180; Refills: 1; Medication taken as needed.) FLUoxetine HCl (40MG  Capsule, 1 Capsule Oral daily, Taken starting 03/03/2014) Active. (Qty: 90; Refills: 1) Fluticasone Propionate (50MCG/ACT Suspension, 2 Spray(s) Nasal daily, Taken starting 05/03/2012) Active. (Qty: 3; Refills: 1) TiZANidine HCl (4MG  Tablet, 1 Tablet Oral) Active. (Medication taken as needed. about once a month) Multivitamin ( Oral) Active. Medications Reconciled.    Past Surgical History(Evan Rivas; 08/24/2014 10:10 AM) KneeSurgery. 1995 Left. Vasectomy. 1998 Hernia Repair. 1998 Skin Cancer. 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 Normal. 1.0 ng/mL on 10/23/2011    Other Problems(Evan Rivas; 08/24/2014 10:15 AM) UTI (urinary tract infection) (599.0  N39.0) Influenza vaccine needed (V04.81  Z23) Acute bronchitis (466.0  J20.9) Dysuria (788.1  R30.0) Dizziness of unknown cause (780.4  R42)    Review of Systems(Evan Rivas; 08/24/2014 10:10 AM) General:Present- Fatigue. Not Present- Chills, Fever and Weight Gain > 10lbs.. Skin:Not Present- Hair Loss, Pruritus and Rash. HEENT:Not Present- Eye Pain, Decreased Hearing, Runny Nose, Snoring and Dry Mucous Membranes. Neck:Not Present- Neck Pain and Swollen Glands. Respiratory:Not Present- Chronic Cough, Difficulty Breathing on  Exertion and Wakes up from Sleep Wheezing or Short of Breath. Breast:Not Present- Gynecomastia. Cardiovascular:Not Present- Edema, Palpitations and Shortness of Breath. Gastrointestinal:Not Present- Diarrhea, Nausea and Vomiting. Male Genitourinary:Present- Change in Urinary Stream, Frequency, Hematuria, Nocturia, Urgencyand Urine Leakage. Note:see HPI Musculoskeletal:Present- Back Pain and Joint Pain. Neurological:Not Present- Decreased Memory, Headaches and Seizures. Psychiatric:Present- Anxiety and Depression. Endocrine:Not Present- Excessive Sweating, Heat Intolerance and Tired/Sluggish. Hematology:Not Present- Abnormal Bleeding, Anemia, Blood Transfusion and Enlarged Lymph Nodes.    Vitals(Evan Rivas; 08/24/2014 10:16 AM) 08/24/2014 10:15 AM Weight: 260.19 lb Height: 73 in Height was reported by patient. Body Surface Area: 2.47 m Body Mass Index: 34.33 kg/m Pulse: 73 (Regular) BP: 148/82 (Sitting, Left Arm, Standard)     Physical Evan Ferrari, MD; 08/24/2014 10:32 AM) The physical exam findings are as follows:   General General Appearance- Well groomed and Consistent with stated age. Not in acute distress.   Head and Neck Head- normocephalic, atraumatic with no lesions or palpable masses.   Chest and Lung Exam Chest and lung exam reveals - normal excursion with symmetric chest walls and quiet, even and easy respiratory effort with no use of accessory muscles.   Abdomen Palpation/Percussion:Palpation and Percussion of the abdomen reveal - Soft, Non Tender and No Rebound tenderness. Kidney (Left):Other Characteristics- Non Tender. Kidney (Right):Other Characteristics- Non Tender.   Neurologic Gait- Normal.    Assessment & Evan Chapel, MD; 08/24/2014 10:38 AM) Kidney stone on right side (592.0  N20.0) Impression: 58-yeaar-old male with RIGHT greater than LEFT nonobstructing kidney stones. Underwent  attempted RIGHT ureteroscopy but unable to advance scope through ureter, status post stent placement for an passive dilation. Plan to return to the operating room on 09/07/2014 for definitive treatment of stones via ureteroscopy, laser lithotripsy, stent exchange. All of the patient's questions were answered today in detail including his activity restrictions, details of surgery, and plan mooving forward. Risks of surgery were also discussed. - Plan/ preop urine culture to rule out infection given history  of elevated sugars, no supsected -R URS, LL, stent planned Current Plans l URINALYSIS, AUTOMATED W/ MICRO (- [facility] -) (81001) l Pt Education - How to access health information online: discussed with patient and provided information.  Gross hematuria (599.71  R31.0) Impression: Status post workup including CT urogram and cystoscopy in the OR on 08/2014.   Signed electronically by Hollice Espy, MD (08/24/2014 10:39 AM)

## 2014-09-07 ENCOUNTER — Ambulatory Visit: Payer: Medicare PPO | Admitting: Anesthesiology

## 2014-09-07 ENCOUNTER — Ambulatory Visit
Admission: RE | Admit: 2014-09-07 | Discharge: 2014-09-07 | Disposition: A | Discharge status: Home / Self Care | Payer: Medicare PPO | Source: Ambulatory Visit | Attending: Urology | Admitting: Urology

## 2014-09-07 ENCOUNTER — Encounter: Payer: Self-pay | Admitting: *Deleted

## 2014-09-07 ENCOUNTER — Encounter
Admission: RE | Disposition: A | Discharge status: Home / Self Care | Payer: Self-pay | Source: Ambulatory Visit | Attending: Urology

## 2014-09-07 DIAGNOSIS — Z79899 Other long term (current) drug therapy: Secondary | ICD-10-CM | POA: Insufficient documentation

## 2014-09-07 DIAGNOSIS — M179 Osteoarthritis of knee, unspecified: Secondary | ICD-10-CM | POA: Diagnosis not present

## 2014-09-07 DIAGNOSIS — N200 Calculus of kidney: Secondary | ICD-10-CM | POA: Diagnosis not present

## 2014-09-07 DIAGNOSIS — M5136 Other intervertebral disc degeneration, lumbar region: Secondary | ICD-10-CM | POA: Diagnosis not present

## 2014-09-07 DIAGNOSIS — E119 Type 2 diabetes mellitus without complications: Secondary | ICD-10-CM | POA: Diagnosis not present

## 2014-09-07 DIAGNOSIS — Z888 Allergy status to other drugs, medicaments and biological substances status: Secondary | ICD-10-CM | POA: Insufficient documentation

## 2014-09-07 DIAGNOSIS — Z886 Allergy status to analgesic agent status: Secondary | ICD-10-CM | POA: Insufficient documentation

## 2014-09-07 DIAGNOSIS — Z887 Allergy status to serum and vaccine status: Secondary | ICD-10-CM | POA: Diagnosis not present

## 2014-09-07 DIAGNOSIS — E669 Obesity, unspecified: Secondary | ICD-10-CM | POA: Insufficient documentation

## 2014-09-07 DIAGNOSIS — M1990 Unspecified osteoarthritis, unspecified site: Secondary | ICD-10-CM | POA: Diagnosis not present

## 2014-09-07 DIAGNOSIS — J309 Allergic rhinitis, unspecified: Secondary | ICD-10-CM | POA: Insufficient documentation

## 2014-09-07 DIAGNOSIS — R310 Gross hematuria: Secondary | ICD-10-CM | POA: Insufficient documentation

## 2014-09-07 DIAGNOSIS — I10 Essential (primary) hypertension: Secondary | ICD-10-CM | POA: Diagnosis not present

## 2014-09-07 DIAGNOSIS — Z7982 Long term (current) use of aspirin: Secondary | ICD-10-CM | POA: Insufficient documentation

## 2014-09-07 DIAGNOSIS — M109 Gout, unspecified: Secondary | ICD-10-CM | POA: Insufficient documentation

## 2014-09-07 DIAGNOSIS — E785 Hyperlipidemia, unspecified: Secondary | ICD-10-CM | POA: Diagnosis not present

## 2014-09-07 DIAGNOSIS — Z79891 Long term (current) use of opiate analgesic: Secondary | ICD-10-CM | POA: Insufficient documentation

## 2014-09-07 DIAGNOSIS — Z791 Long term (current) use of non-steroidal anti-inflammatories (NSAID): Secondary | ICD-10-CM | POA: Insufficient documentation

## 2014-09-07 DIAGNOSIS — E780 Pure hypercholesterolemia: Secondary | ICD-10-CM | POA: Insufficient documentation

## 2014-09-07 DIAGNOSIS — Z85828 Personal history of other malignant neoplasm of skin: Secondary | ICD-10-CM | POA: Insufficient documentation

## 2014-09-07 HISTORY — PX: CYSTOSCOPY W/ URETERAL STENT PLACEMENT: SHX1429

## 2014-09-07 HISTORY — PX: URETEROSCOPY WITH HOLMIUM LASER LITHOTRIPSY: SHX6645

## 2014-09-07 LAB — URINALYSIS COMPLETE WITH MICROSCOPIC (ARMC ONLY)
BILIRUBIN URINE: NEGATIVE
Glucose, UA: 150 mg/dL — AB
KETONES UR: NEGATIVE mg/dL
NITRITE: NEGATIVE
PROTEIN: 100 mg/dL — AB
SPECIFIC GRAVITY, URINE: 1.012 (ref 1.005–1.030)
pH: 7 (ref 5.0–8.0)

## 2014-09-07 LAB — GLUCOSE, CAPILLARY: GLUCOSE-CAPILLARY: 212 mg/dL — AB (ref 65–99)

## 2014-09-07 SURGERY — URETEROSCOPY, WITH LITHOTRIPSY USING HOLMIUM LASER
Anesthesia: General | Laterality: Right | Wound class: Clean Contaminated

## 2014-09-07 MED ORDER — OXYBUTYNIN CHLORIDE 5 MG PO TABS: 5.0000 mg | ORAL_TABLET | Freq: Three times a day (TID) | ORAL | Status: DC | PRN

## 2014-09-07 MED ORDER — FENTANYL CITRATE (PF) 100 MCG/2ML IJ SOLN: 25.0000 ug | INTRAMUSCULAR | Status: DC | PRN

## 2014-09-07 MED ORDER — SODIUM CHLORIDE 0.9 % IV SOLN
INTRAVENOUS | Status: DC
  Administered 2014-09-07: 08:00:00 via INTRAVENOUS

## 2014-09-07 MED ORDER — PROPOFOL 10 MG/ML IV BOLUS
INTRAVENOUS | Status: DC | PRN
  Administered 2014-09-07: 200 mg via INTRAVENOUS

## 2014-09-07 MED ORDER — SUCCINYLCHOLINE CHLORIDE 20 MG/ML IJ SOLN
INTRAMUSCULAR | Status: DC | PRN
  Administered 2014-09-07: 120 mg via INTRAVENOUS

## 2014-09-07 MED ORDER — ONDANSETRON HCL 4 MG/2ML IJ SOLN
INTRAMUSCULAR | Status: DC | PRN
  Administered 2014-09-07: 4 mg via INTRAVENOUS

## 2014-09-07 MED ORDER — ONDANSETRON HCL 4 MG/2ML IJ SOLN: 4.0000 mg | Freq: Once | INTRAMUSCULAR | Status: DC | PRN

## 2014-09-07 MED ORDER — IOTHALAMATE MEGLUMINE 43 % IV SOLN
INTRAVENOUS | Status: DC | PRN
  Administered 2014-09-07: 40 mL

## 2014-09-07 MED ORDER — FAMOTIDINE 20 MG PO TABS
20.0000 mg | ORAL_TABLET | Freq: Once | ORAL | Status: AC
  Administered 2014-09-07: 20 mg via ORAL

## 2014-09-07 MED ORDER — FAMOTIDINE 20 MG PO TABS
ORAL_TABLET | ORAL | Status: AC
  Administered 2014-09-07: 20 mg via ORAL
  Filled 2014-09-07: qty 1

## 2014-09-07 MED ORDER — LACTATED RINGERS IV SOLN
INTRAVENOUS | Status: DC | PRN
  Administered 2014-09-07: 09:00:00 via INTRAVENOUS

## 2014-09-07 MED ORDER — EPHEDRINE SULFATE 50 MG/ML IJ SOLN
INTRAMUSCULAR | Status: DC | PRN
  Administered 2014-09-07 (×2): 10 mg via INTRAVENOUS

## 2014-09-07 MED ORDER — LIDOCAINE HCL (CARDIAC) 20 MG/ML IV SOLN
INTRAVENOUS | Status: DC | PRN
  Administered 2014-09-07: 30 mg via INTRAVENOUS

## 2014-09-07 MED ORDER — MIDAZOLAM HCL 2 MG/2ML IJ SOLN
INTRAMUSCULAR | Status: DC | PRN
  Administered 2014-09-07: 2 mg via INTRAVENOUS

## 2014-09-07 MED ORDER — KETOROLAC TROMETHAMINE 30 MG/ML IJ SOLN
INTRAMUSCULAR | Status: DC | PRN
  Administered 2014-09-07: 30 mg via INTRAVENOUS

## 2014-09-07 MED ORDER — HYDROCODONE-ACETAMINOPHEN 5-325 MG PO TABS: 1.0000 | ORAL_TABLET | Freq: Four times a day (QID) | ORAL | Status: DC | PRN

## 2014-09-07 MED ORDER — SODIUM CHLORIDE 0.9 % IV SOLN
1.0000 g | Freq: Once | INTRAVENOUS | Status: AC
  Administered 2014-09-07: 1 g via INTRAVENOUS
  Filled 2014-09-07: qty 1000

## 2014-09-07 MED ORDER — FENTANYL CITRATE (PF) 100 MCG/2ML IJ SOLN
INTRAMUSCULAR | Status: DC | PRN
  Administered 2014-09-07: 100 ug via INTRAVENOUS

## 2014-09-07 MED ORDER — GENTAMICIN SULFATE 40 MG/ML IJ SOLN
80.0000 mg | Freq: Once | INTRAVENOUS | Status: AC
  Administered 2014-09-07: 80 mg via INTRAVENOUS
  Filled 2014-09-07: qty 2

## 2014-09-07 SURGICAL SUPPLY — 35 items
ADAPTER SCOPE UROLOK II (MISCELLANEOUS) IMPLANT
BAG DRAIN CYSTO-URO LG1000N (MISCELLANEOUS) ×3 IMPLANT
BASKET ZERO TIP 1.9FR (BASKET) ×3 IMPLANT
BULB IRRIG PATHFIND (MISCELLANEOUS) IMPLANT
CATH URETL 5X70 OPEN END (CATHETERS) ×3 IMPLANT
CNTNR SPEC 2.5X3XGRAD LEK (MISCELLANEOUS) ×1
CONRAY 43 FOR UROLOGY 50M (MISCELLANEOUS) ×3 IMPLANT
CONT SPEC 4OZ STER OR WHT (MISCELLANEOUS) ×2
CONTAINER SPEC 2.5X3XGRAD LEK (MISCELLANEOUS) ×1 IMPLANT
GLOVE BIO SURGEON STRL SZ 6.5 (GLOVE) ×2 IMPLANT
GLOVE BIO SURGEON STRL SZ7 (GLOVE) ×6 IMPLANT
GLOVE BIO SURGEONS STRL SZ 6.5 (GLOVE) ×1
GOWN STRL REUS W/ TWL LRG LVL3 (GOWN DISPOSABLE) ×2 IMPLANT
GOWN STRL REUS W/TWL LRG LVL3 (GOWN DISPOSABLE) ×4
INTRODUCER DILATOR DOUBLE (INTRODUCER) IMPLANT
JELLY LUB 2OZ STRL (MISCELLANEOUS) ×2
JELLY LUBE 2OZ STRL (MISCELLANEOUS) ×1 IMPLANT
KIT RM TURNOVER CYSTO AR (KITS) ×3 IMPLANT
LASER HOLMIUM FIBER SU 550UM (MISCELLANEOUS) ×3 IMPLANT
LASER HOLMIUM SU 200UM (MISCELLANEOUS) ×3 IMPLANT
PACK CYSTO AR (MISCELLANEOUS) ×3 IMPLANT
PREP PVP WINGED SPONGE (MISCELLANEOUS) ×3 IMPLANT
PUMP SINGLE ACTION SAP (PUMP) IMPLANT
SENSORWIRE 0.038 NOT ANGLED (WIRE) ×3
SET AMPLATZ RENAL DILATOR (MISCELLANEOUS) ×3 IMPLANT
SET CYSTO W/LG BORE CLAMP LF (SET/KITS/TRAYS/PACK) ×3 IMPLANT
SHEATH URETERAL 12FRX35CM (MISCELLANEOUS) ×3 IMPLANT
SOL .9 NS 3000ML IRR  AL (IV SOLUTION) ×2
SOL .9 NS 3000ML IRR UROMATIC (IV SOLUTION) ×1 IMPLANT
STENT URET 6FRX24 CONTOUR (STENTS) IMPLANT
STENT URET 6FRX26 CONTOUR (STENTS) ×3 IMPLANT
SYRINGE IRR TOOMEY STRL 70CC (SYRINGE) ×3 IMPLANT
TUBING PRES M/F 48IN 4237111 (TUBING) ×3 IMPLANT
WATER STERILE IRR 1000ML POUR (IV SOLUTION) ×3 IMPLANT
WIRE SENSOR 0.038 NOT ANGLED (WIRE) ×1 IMPLANT

## 2014-09-07 NOTE — Progress Notes (Signed)
Gentamycin start on call to OR - Ampicillin sent with pt to OR

## 2014-09-07 NOTE — Discharge Instructions (Addendum)
You have a ureteral stent in place.  This is a tube that extends from your kidney to your bladder.  This may cause urinary bleeding, burning with urination, and urinary frequency.  Please call our office or present to the ED if you develop fevers >101 or pain which is not able to be controlled with oral pain medications.  You may be given either Flomax and/ or ditropan to help with bladder spasms and stent pain in addition to pain medications.    You may remove your stent on Thursday AM.  To do this, simply remove tape from penis and pull string gently toward your knee until the entire stent is removed.    If you have questions or concerns, please call our office.  If you feel uncomfortable removing the stent, call to make a nurse visit appointment Thursday for stent removal.     [facility] [address], [place], [place] [address] [phone] AMBULATORY SURGERY  DISCHARGE INSTRUCTIONS   1) The drugs that you were given will stay in your system until tomorrow so for the next 24 hours you should not:  A) Drive an automobile B) Make any legal decisions C) Drink any alcoholic beverage   2) You may resume regular meals tomorrow.  Today it is better to start with liquids and gradually work up to solid foods.  You may eat anything you prefer, but it is better to start with liquids, then soup and crackers, and gradually work up to solid foods.   3) Please notify your doctor immediately if you have any unusual bleeding, trouble breathing, redness and pain at the surgery site, drainage, fever, or pain not relieved by medication.  4) Your post-operative visit with Dr.                                     is: Date:                        Time:    Please call to schedule your post-operative visit.  5) Additional Instructions: 6)

## 2014-09-07 NOTE — Brief Op Note (Signed)
09/07/2014  10:21 AM  PATIENT:  Evan Rivas  64 y.o. male  PRE-OPERATIVE DIAGNOSIS:  Right KIDNEY STONE  POST-OPERATIVE DIAGNOSIS:  Right KIDNEY STONE  PROCEDURE:  Procedure(s): URETEROSCOPY WITH HOLMIUM LASER LITHOTRIPSY (Right) CYSTOSCOPY WITH STENT REPLACEMENT (Right)  SURGEON:  Surgeon(s) and Role:    * Hollice Espy, MD - Primary  ASSISTANTS: none   ANESTHESIA:   general  EBL:     Drains: 6x26 Fr JJ ureteral stent on right (string on stent)  Specimen: stone fragment  COUNTS CORRECT: YES  PLAN OF CARE: Admit for overnight observation  PATIENT DISPOSITION:  PACU - hemodynamically stable.

## 2014-09-07 NOTE — Interval H&P Note (Signed)
History and Physical Interval Note:  09/07/2014 9:06 AM  Evan Rivas  has presented today for surgery, with the diagnosis of KIDNEY STONE  The various methods of treatment have been discussed with the patient and family. After consideration of risks, benefits and other options for treatment, the patient has consented to  Procedure(s): URETEROSCOPY WITH HOLMIUM LASER LITHOTRIPSY (Right) CYSTOSCOPY WITH STENT REPLACEMENT (Right) as a surgical intervention .  The patient's history has been reviewed, patient examined, no change in status, stable for surgery.  I have reviewed the patient's chart and labs.  Questions were answered to the patient's satisfaction.     Hollice Espy

## 2014-09-07 NOTE — Anesthesia Procedure Notes (Signed)
Procedure Name: Intubation Performed by: Sinda Du Pre-anesthesia Checklist: Patient identified, Emergency Drugs available, Suction available, Patient being monitored and Timeout performed Patient Re-evaluated:Patient Re-evaluated prior to inductionOxygen Delivery Method: Circle system utilized Preoxygenation: Pre-oxygenation with 100% oxygen Intubation Type: IV induction Ventilation: Mask ventilation without difficulty Laryngoscope Size: Mac and 3 Grade View: Grade I Tube type: Oral Tube size: 7.0 mm Number of attempts: 1 Airway Equipment and Method: Stylet Placement Confirmation: ETT inserted through vocal cords under direct vision,  positive ETCO2 and breath sounds checked- equal and bilateral Secured at: 21 cm Tube secured with: Tape Dental Injury: Teeth and Oropharynx as per pre-operative assessment

## 2014-09-07 NOTE — Interval H&P Note (Signed)
History and Physical Interval Note:  09/07/2014 9:07 AM  Evan Rivas  has presented today for surgery, with the diagnosis of KIDNEY STONE  The various methods of treatment have been discussed with the patient and family. After consideration of risks, benefits and other options for treatment, the patient has consented to  Procedure(s): URETEROSCOPY WITH HOLMIUM LASER LITHOTRIPSY (Right) CYSTOSCOPY WITH STENT REPLACEMENT (Right) as a surgical intervention .  The patient's history has been reviewed, patient examined, no change in status, stable for surgery.  I have reviewed the patient's chart and labs.  Questions were answered to the patient's satisfaction.     Hollice Espy

## 2014-09-07 NOTE — Transfer of Care (Signed)
Immediate Anesthesia Transfer of Care Note  Patient: Evan Rivas  Procedure(s) Performed: Procedure(s): URETEROSCOPY WITH HOLMIUM LASER LITHOTRIPSY (Right) CYSTOSCOPY WITH STENT REPLACEMENT (Right)  Patient Location: PACU  Anesthesia Type:General  Level of Consciousness: awake  Airway & Oxygen Therapy: Patient Spontanous Breathing  Post-op Assessment: Report given to RN  Post vital signs: stable  Last Vitals:  Filed Vitals:   09/07/14 0814  BP: 166/89  Pulse: 73  Temp: 37.3 C  Resp: 18    Complications: No apparent anesthesia complications

## 2014-09-07 NOTE — Anesthesia Preprocedure Evaluation (Signed)
Anesthesia Evaluation  Patient identified by MRN, date of birth, ID band Patient awake    Reviewed: Allergy & Precautions, NPO status , Patient's Chart, lab work & pertinent test results  Airway Mallampati: II  TM Distance: >3 FB Neck ROM: Full    Dental  (+) Chipped, Implants,    Pulmonary  Hx of Bronchitis and pneumonia in the past Also allergic rhinitis breath sounds clear to auscultation  Pulmonary exam normal       Cardiovascular hypertension, Normal cardiovascular exam    Neuro/Psych Anxiety Depression  Neuromuscular disease    GI/Hepatic   Endo/Other  diabetes, Well Controlled, Type 2  Renal/GU Renal diseasestones  negative genitourinary   Musculoskeletal  (+) Arthritis -, Osteoarthritis,    Abdominal (+) + obese,   Peds negative pediatric ROS (+)  Hematology negative hematology ROS (+)   Anesthesia Other Findings   Reproductive/Obstetrics                             Anesthesia Physical Anesthesia Plan  ASA: III  Anesthesia Plan: General   Post-op Pain Management:    Induction: Intravenous  Airway Management Planned: Oral ETT  Additional Equipment:   Intra-op Plan:   Post-operative Plan: Extubation in OR  Informed Consent: I have reviewed the patients History and Physical, chart, labs and discussed the procedure including the risks, benefits and alternatives for the proposed anesthesia with the patient or authorized representative who has indicated his/her understanding and acceptance.   Dental advisory given  Plan Discussed with: CRNA and Surgeon  Anesthesia Plan Comments:         Anesthesia Quick Evaluation

## 2014-09-07 NOTE — Anesthesia Postprocedure Evaluation (Signed)
  Anesthesia Post-op Note  Patient: Evan Rivas  Procedure(s) Performed: Procedure(s): URETEROSCOPY WITH HOLMIUM LASER LITHOTRIPSY (Right) CYSTOSCOPY WITH STENT REPLACEMENT (Right)  Anesthesia type:General  Patient location: PACU  Post pain: Pain level controlled  Post assessment: Post-op Vital signs reviewed, Patient's Cardiovascular Status Stable, Respiratory Function Stable, Patent Airway and No signs of Nausea or vomiting  Post vital signs: Reviewed and stable  Last Vitals:  Filed Vitals:   09/07/14 1032  BP: 161/82  Pulse: 80  Temp:   Resp: 14    Level of consciousness: awake, alert  and patient cooperative  Complications: No apparent anesthesia complications

## 2014-09-07 NOTE — Op Note (Signed)
Date of procedure: 09/07/2014  Preoperative diagnosis:  1. Right kidney stone   Postoperative diagnosis:  1. Right kidney stone   Procedure: 1. Right ureteroscopy, laser lithotripsy, right ureteral stent exchange 2. Right retrograde pyelogram  Surgeon: Hollice Espy, MD  Anesthesia: General  Complications: None  Intraoperative findings:   EBL: Minimal  Specimens: Stone fragment  Drains: 6 X by 26 French double-J ureteral stent on right, string in place  Indication: Evan Rivas is a 64 y.o. patient with bilateral nonobstructing kidney stones right greater than left measuring up to 8 mm within the renal pelvis. He was counseled to undergo right ureteroscopy for his more significant stone burden on the side.  After reviewing the management options for treatment, he elected to proceed with the above surgical procedure(s). We have discussed the potential benefits and risks of the procedure, side effects of the proposed treatment, the likelihood of the patient achieving the goals of the procedure, and any potential problems that might occur during the procedure or recuperation. Informed consent has been obtained.  Description of procedure:  The patient was taken to the operating room and general anesthesia was induced.  The patient was placed in the dorsal lithotomy position, prepped and draped in the usual sterile fashion, and preoperative antibiotics were administered. A preoperative time-out was performed.   A 22 French cystoscope was advanced per urethra into the bladder. The distal end of the previously placed right ureteral stent was identified, grasped with a stent grasper, brought out to the level of the urethral meatus.  The stent was then cannulated using a 0.038 sensor wire up to level of the kidney leaving the wire place and removing the stent. A dual lumen access sheath was used to introduce a second Super Stiff wire up to the level of the kidney without difficulty. The  sensor wire was then snapped in place as a safety wire. A flexible 7 French ureteroscope was then advanced over the Super Stiff wire up to the level of the kidney and the wire was removed. At this point in time the kidney was surveyed. It was a UPJ stone approximately 8 mm in size which was pushed into an upper midpole calyx. There is a few other small Randall's plaques Route the kidney but no other significant the size stones visible. A 200  laser fiber was then brought in and using settings of 0.2 Hz and 50 J, the largest of the stones was dusted into very tiny particles. Stone was relatively soft and dusted nicely. The stone fragments were no larger than the tip of the laser fiber. Remainder of the kidney was then directly visualized in a few the Randall's plaques were dislodged from the mucosa. There are no significant remaining stones. At this point in time, the scope was backed to level of the proximal ureter and a retrograde pyelogram was performed creating a roadmap of the right kidney. Each and every calyx was then directly visualized using fluoroscopy as guidance to ensure that the entire kidney was surveyed. A 1.9 French to this nitinol basket was then used to grasp of the debris which was brought down the length of the ureter inspecting the ureter along the way. There were no ureteral injuries or fragments noted. A safety wire was then backloaded over a rigid cystoscope and a 6 x 26 French double-J ureteral stent was advanced over this wire to level of the renal pelvis. The wire was partially withdrawn until a coil was noted within the renal pelvis.  The wire was then fully withdrawn until coil was noted within the bladder. The bladder was then drained. The string was left attached to the stent. The patient was cleaned and dried. The stent string was attached to the patient's glans using Mastisol and a Tegaderm. His foreskin was replaced back into its normal anatomic position.  The patient was then  repositioned in the supine position, reversed from anesthesia, and taken to the PACU in stable condition. There are no common locations of this case.  Plan: The patient will remove his own stent in 3 days and follow-up in 4 weeks with a renal ultrasound prior.  Hollice Espy, M.D.

## 2014-09-09 LAB — URINE CULTURE: CULTURE: NO GROWTH

## 2014-09-10 LAB — STONE ANALYSIS
CA OXALATE, DIHYDRATE: 60 %
CA PHOS CRY STONE QL IR: 3 %
Ca Oxalate,Monohydr.: 37 %
Stone Weight KSTONE: <10 mg

## 2014-09-30 ENCOUNTER — Other Ambulatory Visit: Payer: Medicare PPO

## 2014-10-06 ENCOUNTER — Telehealth: Payer: Self-pay | Admitting: Family Medicine

## 2014-10-06 DIAGNOSIS — M25562 Pain in left knee: Secondary | ICD-10-CM

## 2014-10-06 DIAGNOSIS — M545 Low back pain: Principal | ICD-10-CM

## 2014-10-06 DIAGNOSIS — G8929 Other chronic pain: Secondary | ICD-10-CM

## 2014-10-06 DIAGNOSIS — M25561 Pain in right knee: Secondary | ICD-10-CM

## 2014-10-06 NOTE — Telephone Encounter (Signed)
Patient needs a refill on hydrocodone. 

## 2014-10-07 ENCOUNTER — Other Ambulatory Visit: Payer: Self-pay | Admitting: Family Medicine

## 2014-10-07 DIAGNOSIS — M545 Low back pain: Principal | ICD-10-CM

## 2014-10-07 DIAGNOSIS — G8929 Other chronic pain: Secondary | ICD-10-CM

## 2014-10-07 DIAGNOSIS — M25562 Pain in left knee: Secondary | ICD-10-CM

## 2014-10-07 DIAGNOSIS — M25561 Pain in right knee: Secondary | ICD-10-CM

## 2014-10-08 NOTE — Telephone Encounter (Signed)
Patient needs appt for 50-month pain medication refill last appt was March 16,2016

## 2014-10-08 NOTE — Telephone Encounter (Signed)
Called pt to inform and he let me know he was here 09/03/14. I checked and that is correct.

## 2014-10-09 ENCOUNTER — Ambulatory Visit: Payer: Self-pay | Admitting: Urology

## 2014-10-09 ENCOUNTER — Telehealth: Payer: Self-pay | Admitting: Urology

## 2014-10-09 ENCOUNTER — Encounter: Payer: Self-pay | Admitting: Family Medicine

## 2014-10-09 DIAGNOSIS — N200 Calculus of kidney: Secondary | ICD-10-CM

## 2014-10-09 MED ORDER — HYDROCODONE-ACETAMINOPHEN 10-325 MG PO TABS: 1.0000 | ORAL_TABLET | Freq: Three times a day (TID) | ORAL | Status: DC | PRN

## 2014-10-09 NOTE — Telephone Encounter (Signed)
Patient was a no show today for clinic. Please call him and arrange for renal ultrasound and a follow-up visit. The purpose of the ultrasound is to assess for any residual stone fragments or swelling of the kidney to ensure that it appears healthy after manipulation. This is very important.  Hollice Espy, MD

## 2014-10-13 NOTE — Telephone Encounter (Signed)
Pt came to the office to get RX taken care of.

## 2014-10-26 ENCOUNTER — Ambulatory Visit
Admission: RE | Admit: 2014-10-26 | Discharge: 2014-10-26 | Disposition: A | Discharge status: Home / Self Care | Payer: Medicare PPO | Source: Ambulatory Visit | Attending: Urology | Admitting: Urology

## 2014-10-26 DIAGNOSIS — Z9889 Other specified postprocedural states: Secondary | ICD-10-CM | POA: Diagnosis not present

## 2014-10-26 DIAGNOSIS — N200 Calculus of kidney: Secondary | ICD-10-CM | POA: Diagnosis not present

## 2014-10-26 DIAGNOSIS — N281 Cyst of kidney, acquired: Secondary | ICD-10-CM | POA: Diagnosis not present

## 2014-11-04 ENCOUNTER — Ambulatory Visit: Payer: Medicare PPO | Admitting: Urology

## 2014-11-10 ENCOUNTER — Other Ambulatory Visit: Payer: Self-pay | Admitting: Family Medicine

## 2014-11-10 DIAGNOSIS — M25562 Pain in left knee: Secondary | ICD-10-CM

## 2014-11-10 DIAGNOSIS — M25561 Pain in right knee: Secondary | ICD-10-CM

## 2014-11-10 DIAGNOSIS — G8929 Other chronic pain: Secondary | ICD-10-CM

## 2014-11-10 DIAGNOSIS — M545 Low back pain: Principal | ICD-10-CM

## 2014-11-10 MED ORDER — HYDROCODONE-ACETAMINOPHEN 10-325 MG PO TABS: 1.0000 | ORAL_TABLET | Freq: Three times a day (TID) | ORAL | Status: DC | PRN

## 2014-11-10 NOTE — Telephone Encounter (Signed)
Requesting refill on hydrocodone 10-325mg . Patient is completely out.

## 2014-11-10 NOTE — Telephone Encounter (Signed)
Medication has been ordered, routed to Dr. Manuella Ghazi to sign

## 2014-11-11 ENCOUNTER — Other Ambulatory Visit: Payer: Self-pay | Admitting: Family Medicine

## 2014-12-04 ENCOUNTER — Ambulatory Visit (INDEPENDENT_AMBULATORY_CARE_PROVIDER_SITE_OTHER): Payer: Medicare PPO | Admitting: Family Medicine

## 2014-12-04 ENCOUNTER — Encounter (INDEPENDENT_AMBULATORY_CARE_PROVIDER_SITE_OTHER): Payer: Self-pay

## 2014-12-04 ENCOUNTER — Encounter: Payer: Self-pay | Admitting: Family Medicine

## 2014-12-04 VITALS — BP 142/90 | HR 80 | Temp 98.3°F | Resp 19 | Ht 74.0 in | Wt 259.8 lb

## 2014-12-04 DIAGNOSIS — I10 Essential (primary) hypertension: Secondary | ICD-10-CM

## 2014-12-04 DIAGNOSIS — E119 Type 2 diabetes mellitus without complications: Secondary | ICD-10-CM

## 2014-12-04 DIAGNOSIS — M170 Bilateral primary osteoarthritis of knee: Secondary | ICD-10-CM

## 2014-12-04 DIAGNOSIS — M545 Low back pain: Secondary | ICD-10-CM | POA: Diagnosis not present

## 2014-12-04 DIAGNOSIS — M1A4720 Other secondary chronic gout, left ankle and foot, without tophus (tophi): Secondary | ICD-10-CM | POA: Diagnosis not present

## 2014-12-04 DIAGNOSIS — E785 Hyperlipidemia, unspecified: Secondary | ICD-10-CM | POA: Diagnosis not present

## 2014-12-04 DIAGNOSIS — G8929 Other chronic pain: Secondary | ICD-10-CM

## 2014-12-04 MED ORDER — METOPROLOL SUCCINATE ER 100 MG PO TB24: 100.0000 mg | ORAL_TABLET | ORAL | Status: DC

## 2014-12-04 MED ORDER — HYDROCODONE-ACETAMINOPHEN 10-325 MG PO TABS: 1.0000 | ORAL_TABLET | Freq: Three times a day (TID) | ORAL | Status: DC | PRN

## 2014-12-04 MED ORDER — LOSARTAN POTASSIUM 100 MG PO TABS: 100.0000 mg | ORAL_TABLET | Freq: Every day | ORAL | Status: DC

## 2014-12-04 MED ORDER — ALLOPURINOL 300 MG PO TABS: 300.0000 mg | ORAL_TABLET | Freq: Every day | ORAL | Status: DC

## 2014-12-04 NOTE — Progress Notes (Signed)
Name: Evan Rivas   MRN: [id number]    DOB: [birth year]   Date:12/04/2014       Progress Note  Subjective  Chief Complaint  Chief Complaint  Patient presents with  . Follow-up    3 mo  . Diabetes  . Hyperlipidemia  . Medication Refill    Diabetes He presents for his follow-up diabetic visit. He has type 2 diabetes mellitus. Pertinent negatives for diabetes include no chest pain. Current diabetic treatment includes oral agent (monotherapy). His breakfast blood glucose range is generally 110-130 mg/dl.  Hyperlipidemia This is a chronic problem. The problem is controlled. Pertinent negatives include no chest pain, leg pain, myalgias or shortness of breath. Current antihyperlipidemic treatment includes statins.  Hypertension This is a chronic problem. The problem is uncontrolled. Pertinent negatives include no chest pain, palpitations or shortness of breath. Past treatments include beta blockers and angiotensin blockers.  Back Pain This is a chronic problem. The pain is present in the lumbar spine. Pertinent negatives include no chest pain, fever or leg pain. He has tried analgesics and muscle relaxant for the symptoms.  Arthritis Presents for follow-up visit. He complains of pain. He reports no joint swelling or joint warmth. Affected locations include the right knee and left knee. Pertinent negatives include no fever. Past treatments include surgery.    Past Medical History  Diagnosis Date  . Arthritis   . Hypertension   . Hyperlipidemia   . Depression   . Diabetes mellitus without complication   . Anxiety     panic attacks  . Cancer     lip  . Gout     Past Surgical History  Procedure Laterality Date  . Hernia repair    . Arthroscopic repair acl Left   . Mohs surgery      lip  . Cystoscopy/ureteroscopy/holmium laser/stent placement Right 08/17/2014    Procedure: CYSTOSCOPY/URETEROSCOPY//STENT PLACEMENT/ attempt of lithotripsy;  Surgeon: Hollice Espy, MD;  Location:  [facility];  Service: Urology;  Laterality: Right;  . Ureteroscopy with holmium laser lithotripsy Right 09/07/2014    Procedure: URETEROSCOPY WITH HOLMIUM LASER LITHOTRIPSY;  Surgeon: Hollice Espy, MD;  Location: [facility];  Service: Urology;  Laterality: Right;  . Cystoscopy w/ ureteral stent placement Right 09/07/2014    Procedure: CYSTOSCOPY WITH STENT REPLACEMENT;  Surgeon: Hollice Espy, MD;  Location: [facility];  Service: Urology;  Laterality: Right;    Family History  Problem Relation Age of Onset  . Hypertension Mother   . Cancer Mother   . Stroke Father   . Depression Sister     Social History   Social History  . Marital Status: Married    Spouse Name: N/A  . Number of Children: N/A  . Years of Education: N/A   Occupational History  . Not on file.   Social History Main Topics  . Smoking status: Never Smoker   . Smokeless tobacco: Never Used  . Alcohol Use: No  . Drug Use: No  . Sexual Activity: Yes   Other Topics Concern  . Not on file   Social History Narrative     Current outpatient prescriptions:  .  allopurinol (ZYLOPRIM) 300 MG tablet, Take 1 tablet (300 mg total) by mouth daily., Disp: 90 tablet, Rfl: 0 .  amLODipine (NORVASC) 10 MG tablet, Take 1 tablet (10 mg total) by mouth daily., Disp: 90 tablet, Rfl: 1 .  aspirin EC 81 MG tablet, Take 81 mg by mouth daily., Disp: , Rfl:  .  atorvastatin (LIPITOR)  40 MG tablet, Take 1 tablet (40 mg total) by mouth at bedtime. At Bedtime, Disp: 90 tablet, Rfl: 1 .  clonazePAM (KLONOPIN) 1 MG tablet, Take 1 mg by mouth 2 (two) times daily as needed., Disp: , Rfl:  .  FLUoxetine (PROZAC) 40 MG capsule, Take 1 capsule (40 mg total) by mouth every morning., Disp: 90 capsule, Rfl: 1 .  fluticasone (FLONASE) 50 MCG/ACT nasal spray, Place 50 sprays into the nose as needed. 2 sprays, Disp: , Rfl:  .  HYDROcodone-acetaminophen (NORCO) 10-325 MG per tablet, Take 1 tablet by mouth 3 (three) times daily as needed for moderate pain.  10-325, Disp: 90 tablet, Rfl: 0 .  HYDROcodone-acetaminophen (NORCO/VICODIN) 5-325 MG per tablet, TK 1 TO 2 TS PO Q 6 H PRN FOR MODERATE PAIN, Disp: , Rfl: 0 .  losartan (COZAAR) 100 MG tablet, Take 100 mg by mouth at bedtime. , Disp: , Rfl:  .  metFORMIN (GLUCOPHAGE-XR) 500 MG 24 hr tablet, 1000mg  BID, Disp: 360 tablet, Rfl: 1 .  metoprolol succinate (TOPROL-XL) 100 MG 24 hr tablet, Take 100 mg by mouth every morning. , Disp: , Rfl:  .  metoprolol succinate (TOPROL-XL) 25 MG 24 hr tablet, Take 1 tablet (25 mg total) by mouth every morning., Disp: 90 tablet, Rfl: 1 .  MULTIPLE VITAMIN PO, Take by mouth daily., Disp: , Rfl:  .  nabumetone (RELAFEN) 500 MG tablet, Take 1 tablet (500 mg total) by mouth 2 (two) times daily., Disp: 180 tablet, Rfl: 1 .  oxybutynin (DITROPAN) 5 MG tablet, Take 1 tablet (5 mg total) by mouth every 8 (eight) hours as needed for bladder spasms., Disp: 30 tablet, Rfl: 0 .  oxybutynin (DITROPAN) 5 MG tablet, Take 1 tablet (5 mg total) by mouth every 8 (eight) hours as needed for bladder spasms., Disp: 15 tablet, Rfl: 0 .  tamsulosin (FLOMAX) 0.4 MG CAPS capsule, Take 0.4 mg by mouth daily after supper., Disp: , Rfl:  .  tiZANidine (ZANAFLEX) 4 MG tablet, Take 4 mg by mouth as needed., Disp: , Rfl:   Allergies  Allergen Reactions  . 2,4-D Dimethylamine (Amisol) Nausea And Vomiting  . Celebrex  [Celecoxib]     GI  . Tetanus Toxoids Other (See Comments)    unkown  . Tetanus-Diphtheria Toxoids Td     Other reaction(s): Unknown     Review of Systems  Constitutional: Negative for fever and chills.  Respiratory: Negative for cough and shortness of breath.   Cardiovascular: Negative for chest pain and palpitations.  Musculoskeletal: Positive for back pain, joint pain and arthritis. Negative for myalgias and joint swelling.     Objective  Filed Vitals:   12/04/14 0811  BP: 142/90  Pulse: 80  Temp: 98.3 F (36.8 C)  TempSrc: Oral  Resp: 19  Height: 6\' 2"  (1.88  m)  Weight: 259 lb 12.8 oz (117.845 kg)  SpO2: 93%    Physical Exam  Constitutional: He is oriented to person, place, and time and well-developed, well-nourished, and in no distress.  HENT:  Head: Normocephalic and atraumatic.  Cardiovascular: Normal rate and regular rhythm.   Pulmonary/Chest: Effort normal and breath sounds normal.  Musculoskeletal: He exhibits no edema.       Right knee: He exhibits no swelling and no erythema.       Left knee: He exhibits no swelling and no erythema.  Neurological: He is alert and oriented to person, place, and time.  Skin: Skin is warm and dry.  Nursing note  and vitals reviewed.   Assessment & Plan  1. Primary osteoarthritis of both knees Pain is stable and controlled on opioid therapy.  - HYDROcodone-acetaminophen (NORCO) 10-325 MG per tablet; Take 1 tablet by mouth 3 (three) times daily as needed for moderate pain. 10-325  Dispense: 90 tablet; Refill: 0  2. Essential hypertension  Blood pressure is elevated mainly because patient did not have his a.m. meds today and also because he is recovering from an URI. Reassured. Follow-up  - losartan (COZAAR) 100 MG tablet; Take 1 tablet (100 mg total) by mouth daily.  Dispense: 90 tablet; Refill: 0 - metoprolol succinate (TOPROL-XL) 100 MG 24 hr tablet; Take 1 tablet (100 mg total) by mouth every morning.  Dispense: 90 tablet; Refill: 0  3. 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  Dispense: 90 tablet; Refill: 1  4. Chronic low back pain  - HYDROcodone-acetaminophen (NORCO) 10-325 MG per tablet; Take 1 tablet by mouth 3 (three) times daily as needed for moderate pain. 10-325  Dispense: 90 tablet; Refill: 0  5. Type 2 diabetes mellitus without complication  - HgB U9W  6. Hyperlipidemia - Lipid Profile - Comprehensive Metabolic Panel (CMET)   Geordie Nooney [facility] 12/04/2014 8:26 AM

## 2014-12-05 LAB — LIPID PANEL
CHOLESTEROL TOTAL: 132 mg/dL (ref 100–199)
Chol/HDL Ratio: 3.6 ratio units (ref 0.0–5.0)
HDL: 37 mg/dL — ABNORMAL LOW (ref >39)
LDL Calculated: 69 mg/dL (ref 0–99)
Triglycerides: 130 mg/dL (ref 0–149)
VLDL CHOLESTEROL CAL: 26 mg/dL (ref 5–40)

## 2014-12-05 LAB — COMPREHENSIVE METABOLIC PANEL
A/G RATIO: 1.8 (ref 1.1–2.5)
ALK PHOS: 90 IU/L (ref 39–117)
ALT: 17 IU/L (ref 0–44)
AST: 13 IU/L (ref 0–40)
Albumin: 4.3 g/dL (ref 3.6–4.8)
BILIRUBIN TOTAL: 0.4 mg/dL (ref 0.0–1.2)
BUN/Creatinine Ratio: 14 (ref 10–22)
BUN: 13 mg/dL (ref 8–27)
CHLORIDE: 102 mmol/L (ref 97–108)
CO2: 27 mmol/L (ref 18–29)
Calcium: 9.4 mg/dL (ref 8.6–10.2)
Creatinine, Ser: 0.92 mg/dL (ref 0.76–1.27)
GFR calc non Af Amer: 88 mL/min/{1.73_m2} (ref >59)
GFR, EST AFRICAN AMERICAN: 101 mL/min/{1.73_m2} (ref >59)
Globulin, Total: 2.4 g/dL (ref 1.5–4.5)
Glucose: 171 mg/dL — ABNORMAL HIGH (ref 65–99)
Potassium: 4.1 mmol/L (ref 3.5–5.2)
Sodium: 143 mmol/L (ref 134–144)
TOTAL PROTEIN: 6.7 g/dL (ref 6.0–8.5)

## 2014-12-05 LAB — HEMOGLOBIN A1C
Est. average glucose Bld gHb Est-mCnc: 154 mg/dL
HEMOGLOBIN A1C: 7 % — AB (ref 4.8–5.6)

## 2015-01-05 ENCOUNTER — Other Ambulatory Visit: Payer: Self-pay | Admitting: Family Medicine

## 2015-01-05 DIAGNOSIS — M170 Bilateral primary osteoarthritis of knee: Secondary | ICD-10-CM

## 2015-01-05 DIAGNOSIS — M545 Low back pain: Principal | ICD-10-CM

## 2015-01-05 DIAGNOSIS — G8929 Other chronic pain: Secondary | ICD-10-CM

## 2015-01-05 MED ORDER — HYDROCODONE-ACETAMINOPHEN 10-325 MG PO TABS: 1.0000 | ORAL_TABLET | Freq: Three times a day (TID) | ORAL | Status: DC | PRN

## 2015-01-05 NOTE — Telephone Encounter (Signed)
Hydrocodone 10-325 mg 1 tablet by mouth 3 times a day as needed is ready for pickup

## 2015-01-05 NOTE — Telephone Encounter (Signed)
Pt needs refill on his Hydrocodone.

## 2015-01-05 NOTE — Telephone Encounter (Signed)
Routed to Dr. Shah for approval 

## 2015-01-06 ENCOUNTER — Ambulatory Visit (INDEPENDENT_AMBULATORY_CARE_PROVIDER_SITE_OTHER): Payer: Medicare PPO

## 2015-01-06 DIAGNOSIS — Z23 Encounter for immunization: Secondary | ICD-10-CM

## 2015-01-06 NOTE — Telephone Encounter (Signed)
Notified patient that prescription is ready for pick up, informed patient that he must bring a photo ID, patient verbalized understanding

## 2015-02-04 ENCOUNTER — Other Ambulatory Visit: Payer: Self-pay | Admitting: Family Medicine

## 2015-02-04 DIAGNOSIS — M170 Bilateral primary osteoarthritis of knee: Secondary | ICD-10-CM

## 2015-02-04 DIAGNOSIS — G8929 Other chronic pain: Secondary | ICD-10-CM

## 2015-02-04 DIAGNOSIS — M545 Low back pain: Principal | ICD-10-CM

## 2015-02-04 MED ORDER — HYDROCODONE-ACETAMINOPHEN 10-325 MG PO TABS: 1.0000 | ORAL_TABLET | Freq: Three times a day (TID) | ORAL | Status: DC | PRN

## 2015-02-04 NOTE — Telephone Encounter (Signed)
Routed to Dr. Shah for approval 

## 2015-02-04 NOTE — Telephone Encounter (Signed)
Requesting refill on Hydrocodone 10/325mg .

## 2015-03-05 ENCOUNTER — Ambulatory Visit (INDEPENDENT_AMBULATORY_CARE_PROVIDER_SITE_OTHER): Payer: Medicare PPO | Admitting: Family Medicine

## 2015-03-05 ENCOUNTER — Encounter: Payer: Self-pay | Admitting: Family Medicine

## 2015-03-05 VITALS — BP 140/78 | HR 88 | Temp 99.0°F | Resp 17 | Ht 74.0 in | Wt 267.5 lb

## 2015-03-05 DIAGNOSIS — I10 Essential (primary) hypertension: Secondary | ICD-10-CM | POA: Diagnosis not present

## 2015-03-05 DIAGNOSIS — G8929 Other chronic pain: Secondary | ICD-10-CM | POA: Diagnosis not present

## 2015-03-05 DIAGNOSIS — F32A Depression, unspecified: Secondary | ICD-10-CM | POA: Insufficient documentation

## 2015-03-05 DIAGNOSIS — F329 Major depressive disorder, single episode, unspecified: Secondary | ICD-10-CM | POA: Insufficient documentation

## 2015-03-05 DIAGNOSIS — M170 Bilateral primary osteoarthritis of knee: Secondary | ICD-10-CM

## 2015-03-05 DIAGNOSIS — F331 Major depressive disorder, recurrent, moderate: Secondary | ICD-10-CM | POA: Diagnosis not present

## 2015-03-05 DIAGNOSIS — M545 Low back pain: Secondary | ICD-10-CM

## 2015-03-05 DIAGNOSIS — E785 Hyperlipidemia, unspecified: Secondary | ICD-10-CM

## 2015-03-05 DIAGNOSIS — J302 Other seasonal allergic rhinitis: Secondary | ICD-10-CM

## 2015-03-05 DIAGNOSIS — E119 Type 2 diabetes mellitus without complications: Secondary | ICD-10-CM

## 2015-03-05 LAB — POCT GLYCOSYLATED HEMOGLOBIN (HGB A1C): Hemoglobin A1C: 6.9

## 2015-03-05 MED ORDER — FLUOXETINE HCL 40 MG PO CAPS: 40.0000 mg | ORAL_CAPSULE | ORAL | Status: DC

## 2015-03-05 MED ORDER — METFORMIN HCL ER 500 MG PO TB24: 1000.0000 mg | ORAL_TABLET | Freq: Two times a day (BID) | ORAL | Status: DC

## 2015-03-05 MED ORDER — AMLODIPINE BESYLATE 10 MG PO TABS: 10.0000 mg | ORAL_TABLET | Freq: Every day | ORAL | Status: DC

## 2015-03-05 MED ORDER — METOPROLOL SUCCINATE ER 100 MG PO TB24: 100.0000 mg | ORAL_TABLET | ORAL | Status: DC

## 2015-03-05 MED ORDER — NABUMETONE 500 MG PO TABS: 500.0000 mg | ORAL_TABLET | Freq: Two times a day (BID) | ORAL | Status: DC

## 2015-03-05 MED ORDER — FLUTICASONE PROPIONATE 50 MCG/ACT NA SUSP
2.0000 | Freq: Every day | NASAL | Status: DC
Start: 2015-03-05 — End: 2015-12-08

## 2015-03-05 MED ORDER — HYDROCODONE-ACETAMINOPHEN 10-325 MG PO TABS: 1.0000 | ORAL_TABLET | Freq: Three times a day (TID) | ORAL | Status: DC | PRN

## 2015-03-05 MED ORDER — LOSARTAN POTASSIUM 100 MG PO TABS: 100.0000 mg | ORAL_TABLET | Freq: Every day | ORAL | Status: DC

## 2015-03-05 MED ORDER — METOPROLOL SUCCINATE ER 25 MG PO TB24: 25.0000 mg | ORAL_TABLET | ORAL | Status: DC

## 2015-03-05 MED ORDER — ATORVASTATIN CALCIUM 40 MG PO TABS: 40.0000 mg | ORAL_TABLET | Freq: Every day | ORAL | Status: DC

## 2015-03-05 NOTE — Progress Notes (Signed)
Name: Evan Rivas   MRN: [id number]    DOB: September 28, 1950   Date:03/05/2015       Progress Note  Subjective  Chief Complaint  Chief Complaint  Patient presents with  . Follow-up    3 mo  . Labs Only    Fasting  . Diabetes  . Hyperlipidemia    Diabetes He presents for his follow-up diabetic visit. He has type 2 diabetes mellitus. His disease course has been stable. There are no hypoglycemic associated symptoms. Pertinent negatives for hypoglycemia include no headaches. Associated symptoms include fatigue. Pertinent negatives for diabetes include no chest pain. Pertinent negatives for diabetic complications include no CVA. Current diabetic treatment includes oral agent (monotherapy). He is following a generally healthy diet. There is no change in his home blood glucose trend. His breakfast blood glucose range is generally 110-130 mg/dl. An ACE inhibitor/angiotensin II receptor blocker is being taken.  Hyperlipidemia This is a chronic problem. The problem is controlled. Exacerbating diseases include diabetes and obesity. Pertinent negatives include no chest pain, leg pain, myalgias or shortness of breath. Current antihyperlipidemic treatment includes statins.  Hypertension This is a chronic problem. The problem is controlled. Pertinent negatives include no chest pain, headaches, palpitations or shortness of breath. Past treatments include beta blockers, angiotensin blockers and calcium channel blockers. There is no history of kidney disease, CAD/MI or CVA.  Back Pain This is a chronic problem. The problem is unchanged. The pain is present in the lumbar spine. The pain is at a severity of 6/10. Pertinent negatives include no chest pain, fever, headaches or leg pain. He has tried analgesics and muscle relaxant for the symptoms.  Arthritis Presents for follow-up visit. The disease course has been stable. He complains of pain. He reports no joint swelling or joint warmth. Affected locations  include the right knee and left knee. His pain is at a severity of 7/10. Associated symptoms include fatigue. Pertinent negatives include no fever. Past treatments include surgery.  Depression      The patient presents with depression.  This is a chronic problem.  The onset quality is gradual. The problem is unchanged.  Associated symptoms include fatigue, decreased interest and sad.  Associated symptoms include does not have insomnia, not irritable, no myalgias and no headaches.  Past treatments include SSRIs - Selective serotonin reuptake inhibitors.  Compliance with treatment is good.  Past medical history includes chronic pain and depression.      Past Medical History  Diagnosis Date  . Arthritis   . Hypertension   . Hyperlipidemia   . Depression   . Diabetes mellitus without complication ([place])   . Anxiety     panic attacks  . Cancer (HCC)     lip  . Gout     Past Surgical History  Procedure Laterality Date  . Hernia repair    . Arthroscopic repair acl Left   . Mohs surgery      lip  . Cystoscopy/ureteroscopy/holmium laser/stent placement Right 08/17/2014    Procedure: CYSTOSCOPY/URETEROSCOPY//STENT PLACEMENT/ attempt of lithotripsy;  Surgeon: Hollice Espy, MD;  Location: [facility];  Service: Urology;  Laterality: Right;  . Ureteroscopy with holmium laser lithotripsy Right 09/07/2014    Procedure: URETEROSCOPY WITH HOLMIUM LASER LITHOTRIPSY;  Surgeon: Hollice Espy, MD;  Location: [facility];  Service: Urology;  Laterality: Right;  . Cystoscopy w/ ureteral stent placement Right 09/07/2014    Procedure: CYSTOSCOPY WITH STENT REPLACEMENT;  Surgeon: Hollice Espy, MD;  Location: [facility];  Service: Urology;  Laterality:  Right;    Family History  Problem Relation Age of Onset  . Hypertension Mother   . Cancer Mother   . Stroke Father   . Depression Sister     Social History   Social History  . Marital Status: Married    Spouse Name: N/A  . Number of Children: N/A  . Years  of Education: N/A   Occupational History  . Not on file.   Social History Main Topics  . Smoking status: Never Smoker   . Smokeless tobacco: Never Used  . Alcohol Use: No  . Drug Use: No  . Sexual Activity: Yes   Other Topics Concern  . Not on file   Social History Narrative     Current outpatient prescriptions:  .  allopurinol (ZYLOPRIM) 300 MG tablet, Take 1 tablet (300 mg total) by mouth daily., Disp: 90 tablet, Rfl: 1 .  amLODipine (NORVASC) 10 MG tablet, Take 1 tablet (10 mg total) by mouth daily., Disp: 90 tablet, Rfl: 1 .  aspirin EC 81 MG tablet, Take 81 mg by mouth daily., Disp: , Rfl:  .  atorvastatin (LIPITOR) 40 MG tablet, Take 1 tablet (40 mg total) by mouth at bedtime. At Bedtime, Disp: 90 tablet, Rfl: 1 .  clonazePAM (KLONOPIN) 1 MG tablet, Take 1 mg by mouth 2 (two) times daily as needed., Disp: , Rfl:  .  FLUoxetine (PROZAC) 40 MG capsule, Take 1 capsule (40 mg total) by mouth every morning., Disp: 90 capsule, Rfl: 1 .  fluticasone (FLONASE) 50 MCG/ACT nasal spray, Place 50 sprays into the nose as needed. 2 sprays, Disp: , Rfl:  .  HYDROcodone-acetaminophen (NORCO) 10-325 MG tablet, Take 1 tablet by mouth 3 (three) times daily as needed for moderate pain. 10-325, Disp: 90 tablet, Rfl: 0 .  losartan (COZAAR) 100 MG tablet, Take 1 tablet (100 mg total) by mouth daily., Disp: 90 tablet, Rfl: 0 .  metFORMIN (GLUCOPHAGE-XR) 500 MG 24 hr tablet, 1000mg  BID, Disp: 360 tablet, Rfl: 1 .  metoprolol succinate (TOPROL-XL) 100 MG 24 hr tablet, Take 1 tablet (100 mg total) by mouth every morning., Disp: 90 tablet, Rfl: 0 .  metoprolol succinate (TOPROL-XL) 25 MG 24 hr tablet, Take 1 tablet (25 mg total) by mouth every morning., Disp: 90 tablet, Rfl: 1 .  MULTIPLE VITAMIN PO, Take by mouth daily., Disp: , Rfl:  .  nabumetone (RELAFEN) 500 MG tablet, Take 1 tablet (500 mg total) by mouth 2 (two) times daily., Disp: 180 tablet, Rfl: 1 .  oxybutynin (DITROPAN) 5 MG tablet, Take 1  tablet (5 mg total) by mouth every 8 (eight) hours as needed for bladder spasms., Disp: 30 tablet, Rfl: 0 .  oxybutynin (DITROPAN) 5 MG tablet, Take 1 tablet (5 mg total) by mouth every 8 (eight) hours as needed for bladder spasms., Disp: 15 tablet, Rfl: 0 .  tamsulosin (FLOMAX) 0.4 MG CAPS capsule, Take 0.4 mg by mouth daily after supper., Disp: , Rfl:  .  tiZANidine (ZANAFLEX) 4 MG tablet, Take 4 mg by mouth as needed., Disp: , Rfl:   Allergies  Allergen Reactions  . 2,4-D Dimethylamine (Amisol) Nausea And Vomiting  . Celebrex  [Celecoxib]     GI  . Tetanus Toxoids Other (See Comments)    unkown  . Tetanus-Diphtheria Toxoids Td     Other reaction(s): Unknown    Review of Systems  Constitutional: Positive for fatigue. Negative for fever.  Respiratory: Negative for shortness of breath.   Cardiovascular: Negative for  chest pain and palpitations.  Musculoskeletal: Positive for back pain and arthritis. Negative for myalgias and joint swelling.  Neurological: Negative for headaches.  Psychiatric/Behavioral: Positive for depression. The patient does not have insomnia.     Objective  Filed Vitals:   03/05/15 0817  BP: 140/78  Pulse: 88  Temp: 99 F (37.2 C)  TempSrc: Oral  Resp: 17  Height: 6\' 2"  (1.88 m)  Weight: 267 lb 8 oz (121.337 kg)  SpO2: 95%    Physical Exam  Constitutional: He is oriented to person, place, and time and well-developed, well-nourished, and in no distress. He is not irritable.  HENT:  Head: Normocephalic and atraumatic.  Nose: Mucosal edema present. No sinus tenderness.  Eyes: Pupils are equal, round, and reactive to light.  Cardiovascular: Normal rate, regular rhythm and normal heart sounds.   No murmur heard. Pulmonary/Chest: Effort normal and breath sounds normal. He has no wheezes.  Abdominal: Soft. Bowel sounds are normal. There is no tenderness.  Musculoskeletal:       Right knee: He exhibits decreased range of motion. He exhibits no swelling.        Left knee: He exhibits decreased range of motion. He exhibits no swelling.       Lumbar back: He exhibits tenderness and pain.       Back:  Neurological: He is alert and oriented to person, place, and time.  Skin: Skin is warm and dry.  Psychiatric: Mood, memory, affect and judgment normal.  Nursing note and vitals reviewed.    Recent Results (from the past 2160 hour(s))  POCT HgB A1C     Status: Normal   Collection Time: 03/05/15  8:35 AM  Result Value Ref Range   Hemoglobin A1C 6.9      Assessment & Plan  1. Essential hypertension BP controlled on present therapy - amLODipine (NORVASC) 10 MG tablet; Take 1 tablet (10 mg total) by mouth daily.  Dispense: 90 tablet; Refill: 1 - losartan (COZAAR) 100 MG tablet; Take 1 tablet (100 mg total) by mouth daily.  Dispense: 90 tablet; Refill: 0 - metoprolol succinate (TOPROL-XL) 100 MG 24 hr tablet; Take 1 tablet (100 mg total) by mouth every morning.  Dispense: 90 tablet; Refill: 0 - metoprolol succinate (TOPROL-XL) 25 MG 24 hr tablet; Take 1 tablet (25 mg total) by mouth every morning.  Dispense: 90 tablet; Refill: 1  2. Primary osteoarthritis of both knees Pain controlled on Norco 10 mg taken 3 times daily as needed. Patient compliant with controlled substances agreement - HYDROcodone-acetaminophen (NORCO) 10-325 MG tablet; Take 1 tablet by mouth 3 (three) times daily as needed for moderate pain. 10-325  Dispense: 90 tablet; Refill: 0  3. Hyperlipidemia  - atorvastatin (LIPITOR) 40 MG tablet; Take 1 tablet (40 mg total) by mouth at bedtime. At Bedtime  Dispense: 90 tablet; Refill: 1 - Comprehensive Metabolic Panel (CMET) - Lipid Profile  4. Chronic low back pain  - nabumetone (RELAFEN) 500 MG tablet; Take 1 tablet (500 mg total) by mouth 2 (two) times daily.  Dispense: 180 tablet; Refill: 1 - HYDROcodone-acetaminophen (NORCO) 10-325 MG tablet; Take 1 tablet by mouth 3 (three) times daily as needed for moderate pain. 10-325   Dispense: 90 tablet; Refill: 0  5. Major depressive disorder, recurrent episode, moderate (HCC)  - FLUoxetine (PROZAC) 40 MG capsule; Take 1 capsule (40 mg total) by mouth every morning.  Dispense: 90 capsule; Refill: 1  6. Type 2 diabetes mellitus without complication, without long-term current use of  insulin (HCC) A1c at goal. Continue current therapy - POCT HgB A1C - metFORMIN (GLUCOPHAGE-XR) 500 MG 24 hr tablet; Take 2 tablets (1,000 mg total) by mouth 2 (two) times daily. 1000mg  BID  Dispense: 360 tablet; Refill: 1  7. Other seasonal allergic rhinitis  - fluticasone (FLONASE) 50 MCG/ACT nasal spray; Place 2 sprays into both nostrils daily. 2 sprays  Dispense: 16 g; Refill: 0     Forney Kleinpeter [facility] 03/05/2015 8:38 AM

## 2015-03-06 LAB — COMPREHENSIVE METABOLIC PANEL
A/G RATIO: 1.7 (ref 1.1–2.5)
ALT: 18 IU/L (ref 0–44)
AST: 12 IU/L (ref 0–40)
Albumin: 4.1 g/dL (ref 3.6–4.8)
Alkaline Phosphatase: 86 IU/L (ref 39–117)
BILIRUBIN TOTAL: 0.4 mg/dL (ref 0.0–1.2)
BUN / CREAT RATIO: 13 (ref 10–22)
BUN: 12 mg/dL (ref 8–27)
CO2: 27 mmol/L (ref 18–29)
Calcium: 9.3 mg/dL (ref 8.6–10.2)
Chloride: 100 mmol/L (ref 97–106)
Creatinine, Ser: 0.94 mg/dL (ref 0.76–1.27)
GFR calc Af Amer: 99 mL/min/{1.73_m2} (ref >59)
GFR calc non Af Amer: 85 mL/min/{1.73_m2} (ref >59)
Globulin, Total: 2.4 g/dL (ref 1.5–4.5)
Glucose: 186 mg/dL — ABNORMAL HIGH (ref 65–99)
POTASSIUM: 4.1 mmol/L (ref 3.5–5.2)
SODIUM: 145 mmol/L — AB (ref 136–144)
Total Protein: 6.5 g/dL (ref 6.0–8.5)

## 2015-03-06 LAB — LIPID PANEL
Chol/HDL Ratio: 3.7 ratio units (ref 0.0–5.0)
Cholesterol, Total: 143 mg/dL (ref 100–199)
HDL: 39 mg/dL — ABNORMAL LOW (ref >39)
LDL Calculated: 81 mg/dL (ref 0–99)
Triglycerides: 114 mg/dL (ref 0–149)
VLDL Cholesterol Cal: 23 mg/dL (ref 5–40)

## 2015-04-07 ENCOUNTER — Other Ambulatory Visit: Payer: Self-pay | Admitting: Family Medicine

## 2015-04-07 DIAGNOSIS — M545 Low back pain: Principal | ICD-10-CM

## 2015-04-07 DIAGNOSIS — M170 Bilateral primary osteoarthritis of knee: Secondary | ICD-10-CM

## 2015-04-07 DIAGNOSIS — G8929 Other chronic pain: Secondary | ICD-10-CM

## 2015-04-07 NOTE — Telephone Encounter (Signed)
Pt needs refill on Hydrocodone 10/325.

## 2015-04-08 NOTE — Telephone Encounter (Signed)
Routed to Dr. Shah for approval 

## 2015-04-09 ENCOUNTER — Telehealth: Payer: Self-pay | Admitting: Family Medicine

## 2015-04-09 MED ORDER — HYDROCODONE-ACETAMINOPHEN 10-325 MG PO TABS: 1.0000 | ORAL_TABLET | Freq: Three times a day (TID) | ORAL | Status: DC | PRN

## 2015-04-09 NOTE — Telephone Encounter (Signed)
Patient is checking status on his refill on hydrocodone. He called in the medication earlier this week but still have not heard anything. Would like to pick up the prescription today.

## 2015-04-09 NOTE — Telephone Encounter (Signed)
Prescription has been refilled and printed ready for patient pickup, he has been notified and will bring photo ID

## 2015-05-06 ENCOUNTER — Other Ambulatory Visit: Payer: Self-pay | Admitting: Family Medicine

## 2015-05-06 DIAGNOSIS — G8929 Other chronic pain: Secondary | ICD-10-CM

## 2015-05-06 DIAGNOSIS — M170 Bilateral primary osteoarthritis of knee: Secondary | ICD-10-CM

## 2015-05-06 DIAGNOSIS — M545 Low back pain: Principal | ICD-10-CM

## 2015-05-06 MED ORDER — HYDROCODONE-ACETAMINOPHEN 10-325 MG PO TABS: 1.0000 | ORAL_TABLET | Freq: Three times a day (TID) | ORAL | Status: DC | PRN

## 2015-05-06 NOTE — Telephone Encounter (Signed)
Requesting refill on Hydrocodone 10/325mg. °

## 2015-05-06 NOTE — Telephone Encounter (Signed)
Routed to Dr. Shah for approval 

## 2015-06-03 ENCOUNTER — Ambulatory Visit (INDEPENDENT_AMBULATORY_CARE_PROVIDER_SITE_OTHER): Payer: Medicare PPO | Admitting: Family Medicine

## 2015-06-03 ENCOUNTER — Encounter: Payer: Self-pay | Admitting: Family Medicine

## 2015-06-03 VITALS — BP 135/80 | HR 86 | Temp 98.8°F | Resp 18 | Ht 74.0 in | Wt 263.9 lb

## 2015-06-03 DIAGNOSIS — E119 Type 2 diabetes mellitus without complications: Secondary | ICD-10-CM

## 2015-06-03 DIAGNOSIS — M545 Low back pain, unspecified: Secondary | ICD-10-CM

## 2015-06-03 DIAGNOSIS — F419 Anxiety disorder, unspecified: Secondary | ICD-10-CM

## 2015-06-03 DIAGNOSIS — I10 Essential (primary) hypertension: Secondary | ICD-10-CM | POA: Diagnosis not present

## 2015-06-03 DIAGNOSIS — G8929 Other chronic pain: Secondary | ICD-10-CM | POA: Diagnosis not present

## 2015-06-03 DIAGNOSIS — N400 Enlarged prostate without lower urinary tract symptoms: Secondary | ICD-10-CM | POA: Insufficient documentation

## 2015-06-03 DIAGNOSIS — M170 Bilateral primary osteoarthritis of knee: Secondary | ICD-10-CM

## 2015-06-03 DIAGNOSIS — E785 Hyperlipidemia, unspecified: Secondary | ICD-10-CM | POA: Diagnosis not present

## 2015-06-03 DIAGNOSIS — M1A4720 Other secondary chronic gout, left ankle and foot, without tophus (tophi): Secondary | ICD-10-CM | POA: Diagnosis not present

## 2015-06-03 LAB — POCT GLYCOSYLATED HEMOGLOBIN (HGB A1C): HEMOGLOBIN A1C: 7.1

## 2015-06-03 LAB — GLUCOSE, POCT (MANUAL RESULT ENTRY): POC Glucose: 170 mg/dl — AB (ref 70–99)

## 2015-06-03 MED ORDER — ALLOPURINOL 300 MG PO TABS: 300.0000 mg | ORAL_TABLET | Freq: Every day | ORAL | Status: DC

## 2015-06-03 MED ORDER — METOPROLOL SUCCINATE ER 25 MG PO TB24: 25.0000 mg | ORAL_TABLET | ORAL | Status: DC

## 2015-06-03 MED ORDER — LOSARTAN POTASSIUM 100 MG PO TABS
100.0000 mg | ORAL_TABLET | Freq: Every day | ORAL | Status: DC
Start: 2015-06-03 — End: 2015-09-03

## 2015-06-03 MED ORDER — METOPROLOL SUCCINATE ER 100 MG PO TB24: 100.0000 mg | ORAL_TABLET | ORAL | Status: DC

## 2015-06-03 MED ORDER — HYDROCODONE-ACETAMINOPHEN 10-325 MG PO TABS: 1.0000 | ORAL_TABLET | Freq: Three times a day (TID) | ORAL | Status: DC | PRN

## 2015-06-03 MED ORDER — CLONAZEPAM 1 MG PO TABS: 1.0000 mg | ORAL_TABLET | Freq: Two times a day (BID) | ORAL | Status: DC | PRN

## 2015-06-03 MED ORDER — ATORVASTATIN CALCIUM 40 MG PO TABS: 40.0000 mg | ORAL_TABLET | Freq: Every day | ORAL | Status: DC

## 2015-06-03 MED ORDER — TIZANIDINE HCL 4 MG PO TABS: 4.0000 mg | ORAL_TABLET | ORAL | Status: DC | PRN

## 2015-06-03 NOTE — Progress Notes (Signed)
Name: Evan Rivas   MRN: [id number]    DOB: 08-Oct-1950   Date:06/03/2015       Progress Note  Subjective  Chief Complaint  Chief Complaint  Patient presents with  . Follow-up    3 mo  . Diabetes  . Hyperlipidemia    Diabetes He has type 2 diabetes mellitus. His disease course has been stable. Hypoglycemia symptoms include nervousness/anxiousness. Pertinent negatives for hypoglycemia include no headaches. Pertinent negatives for diabetes include no blurred vision, no chest pain and no fatigue. Pertinent negatives for diabetic complications include no CVA. Current diabetic treatment includes oral agent (monotherapy). His breakfast blood glucose range is generally 130-140 mg/dl. An ACE inhibitor/angiotensin II receptor blocker is being taken.  Hyperlipidemia This is a chronic problem. The problem is controlled. Exacerbating diseases include diabetes. Pertinent negatives include no chest pain, leg pain or shortness of breath. Current antihyperlipidemic treatment includes statins.  Hypertension This is a chronic problem. The problem is controlled. Associated symptoms include anxiety. Pertinent negatives include no blurred vision, chest pain, headaches, palpitations or shortness of breath. Past treatments include beta blockers, calcium channel blockers and angiotensin blockers. There is no history of kidney disease, CAD/MI or CVA.  Anxiety Presents for follow-up visit. The problem has been unchanged. Symptoms include insomnia and nervous/anxious behavior. Patient reports no chest pain, palpitations, panic or shortness of breath.   Past treatments include benzodiazephines. Compliance with prior treatments has been good.  Depression        This is a chronic problem.  The onset quality is gradual. The problem is unchanged.  Associated symptoms include insomnia.  Associated symptoms include no fatigue, no headaches and not sad.  Past treatments include SSRIs - Selective serotonin reuptake  inhibitors.  Past medical history includes anxiety.   Back Pain This is a chronic problem. The pain is present in the lumbar spine. The pain is at a severity of 6/10. Pertinent negatives include no chest pain, headaches, leg pain or numbness. He has tried analgesics, NSAIDs and muscle relaxant for the symptoms.   Gout History of gout in the past, affected regions included feet (swelling, soreness). Has been on Allopurinol 300 mg daily. No gout attacks while on medication.  Arthritis Presents for follow-up visit. The disease course has been stable. He complains of pain. He reports no joint swelling or joint warmth. Affected locations include the right knee and left knee. His pain is at a severity of 7/10. Associated symptoms include fatigue. Pertinent negatives include no fever. Past treatments include surgery Recently, back pain and knee pain is worse, after his return trip to [place] and back. Past Medical History  Diagnosis Date  . Arthritis   . Hypertension   . Hyperlipidemia   . Depression   . Diabetes mellitus without complication ([place])   . Anxiety     panic attacks  . Cancer (HCC)     lip  . Gout     Past Surgical History  Procedure Laterality Date  . Hernia repair    . Arthroscopic repair acl Left   . Mohs surgery      lip  . Cystoscopy/ureteroscopy/holmium laser/stent placement Right 08/17/2014    Procedure: CYSTOSCOPY/URETEROSCOPY//STENT PLACEMENT/ attempt of lithotripsy;  Surgeon: Hollice Espy, MD;  Location: [facility];  Service: Urology;  Laterality: Right;  . Ureteroscopy with holmium laser lithotripsy Right 09/07/2014    Procedure: URETEROSCOPY WITH HOLMIUM LASER LITHOTRIPSY;  Surgeon: Hollice Espy, MD;  Location: [facility];  Service: Urology;  Laterality: Right;  . Cystoscopy  w/ ureteral stent placement Right 09/07/2014    Procedure: CYSTOSCOPY WITH STENT REPLACEMENT;  Surgeon: Hollice Espy, MD;  Location: [facility];  Service: Urology;  Laterality: Right;    Family  History  Problem Relation Age of Onset  . Hypertension Mother   . Cancer Mother   . Stroke Father   . Depression Sister     Social History   Social History  . Marital Status: Married    Spouse Name: N/A  . Number of Children: N/A  . Years of Education: N/A   Occupational History  . Not on file.   Social History Main Topics  . Smoking status: Never Smoker   . Smokeless tobacco: Never Used  . Alcohol Use: No  . Drug Use: No  . Sexual Activity: Yes   Other Topics Concern  . Not on file   Social History Narrative     Current outpatient prescriptions:  .  allopurinol (ZYLOPRIM) 300 MG tablet, Take 1 tablet (300 mg total) by mouth daily., Disp: 90 tablet, Rfl: 1 .  amLODipine (NORVASC) 10 MG tablet, Take 1 tablet (10 mg total) by mouth daily., Disp: 90 tablet, Rfl: 1 .  aspirin EC 81 MG tablet, Take 81 mg by mouth daily., Disp: , Rfl:  .  atorvastatin (LIPITOR) 40 MG tablet, Take 1 tablet (40 mg total) by mouth at bedtime. At Bedtime, Disp: 90 tablet, Rfl: 1 .  clonazePAM (KLONOPIN) 1 MG tablet, Take 1 mg by mouth 2 (two) times daily as needed., Disp: , Rfl:  .  FLUoxetine (PROZAC) 40 MG capsule, Take 1 capsule (40 mg total) by mouth every morning., Disp: 90 capsule, Rfl: 1 .  fluticasone (FLONASE) 50 MCG/ACT nasal spray, Place 2 sprays into both nostrils daily. 2 sprays, Disp: 16 g, Rfl: 0 .  HYDROcodone-acetaminophen (NORCO) 10-325 MG tablet, Take 1 tablet by mouth 3 (three) times daily as needed for moderate pain. 10-325, Disp: 90 tablet, Rfl: 0 .  losartan (COZAAR) 100 MG tablet, Take 1 tablet (100 mg total) by mouth daily., Disp: 90 tablet, Rfl: 0 .  metFORMIN (GLUCOPHAGE-XR) 500 MG 24 hr tablet, Take 2 tablets (1,000 mg total) by mouth 2 (two) times daily. 1000mg  BID, Disp: 360 tablet, Rfl: 1 .  metoprolol succinate (TOPROL-XL) 100 MG 24 hr tablet, Take 1 tablet (100 mg total) by mouth every morning., Disp: 90 tablet, Rfl: 0 .  metoprolol succinate (TOPROL-XL) 25 MG 24 hr  tablet, Take 1 tablet (25 mg total) by mouth every morning., Disp: 90 tablet, Rfl: 1 .  MULTIPLE VITAMIN PO, Take by mouth daily., Disp: , Rfl:  .  nabumetone (RELAFEN) 500 MG tablet, Take 1 tablet (500 mg total) by mouth 2 (two) times daily., Disp: 180 tablet, Rfl: 1 .  tamsulosin (FLOMAX) 0.4 MG CAPS capsule, Take 0.4 mg by mouth daily after supper., Disp: , Rfl:  .  tiZANidine (ZANAFLEX) 4 MG tablet, Take 4 mg by mouth as needed., Disp: , Rfl:   Allergies  Allergen Reactions  . 2,4-D Dimethylamine (Amisol) Nausea And Vomiting  . Celebrex  [Celecoxib]     GI  . Tetanus Toxoids Other (See Comments)    unkown  . Tetanus-Diphtheria Toxoids Td     Other reaction(s): Unknown     Review of Systems  Constitutional: Negative for fatigue.  Eyes: Negative for blurred vision and double vision.  Respiratory: Negative for shortness of breath.   Cardiovascular: Negative for chest pain, palpitations and leg swelling.  Musculoskeletal: Positive for back  pain and joint pain.  Neurological: Negative for numbness and headaches.  Psychiatric/Behavioral: Positive for depression. The patient is nervous/anxious and has insomnia.     Objective  Filed Vitals:   06/03/15 0829  BP: 135/80  Pulse: 86  Temp: 98.8 F (37.1 C)  TempSrc: Oral  Resp: 18  Height: 6\' 2"  (1.88 m)  Weight: 263 lb 14.4 oz (119.704 kg)  SpO2: 96%    Physical Exam  Constitutional: He is oriented to person, place, and time and well-developed, well-nourished, and in no distress.  HENT:  Head: Normocephalic and atraumatic.  Cardiovascular: Normal rate and regular rhythm.   Pulmonary/Chest: Effort normal and breath sounds normal.  Abdominal: Soft. Bowel sounds are normal.  Musculoskeletal:       Lumbar back: He exhibits tenderness, pain and spasm.       Back:  Neurological: He is alert and oriented to person, place, and time.  Psychiatric: Mood, memory, affect and judgment normal.  Nursing note and vitals  reviewed.   Assessment & Plan  1. Chronic low back pain Somewhat worse, likely from driving to [place] and back. Continue on hydrocodone, and tizanidine. Follow-up in 3 months. - HYDROcodone-acetaminophen (NORCO) 10-325 MG tablet; Take 1 tablet by mouth 3 (three) times daily as needed for moderate pain. 10-325  Dispense: 90 tablet; Refill: 0 - tiZANidine (ZANAFLEX) 4 MG tablet; Take 1 tablet (4 mg total) by mouth as needed.  Dispense: 90 tablet; Refill: 2  2. Primary osteoarthritis of both knees Stable on chronic opioid therapy. - HYDROcodone-acetaminophen (NORCO) 10-325 MG tablet; Take 1 tablet by mouth 3 (three) times daily as needed for moderate pain. 10-325  Dispense: 90 tablet; Refill: 0  3. Essential hypertension BP stable and at goal on current anti-hypertensive therapy. - losartan (COZAAR) 100 MG tablet; Take 1 tablet (100 mg total) by mouth daily.  Dispense: 90 tablet; Refill: 0 - metoprolol succinate (TOPROL-XL) 100 MG 24 hr tablet; Take 1 tablet (100 mg total) by mouth every morning.  Dispense: 90 tablet; Refill: 0 - metoprolol succinate (TOPROL-XL) 25 MG 24 hr tablet; Take 1 tablet (25 mg total) by mouth every morning.  Dispense: 90 tablet; Refill: 1  4. Hyperlipidemia  - atorvastatin (LIPITOR) 40 MG tablet; Take 1 tablet (40 mg total) by mouth at bedtime. At Bedtime  Dispense: 90 tablet; Refill: 1 - Lipid Profile - Comprehensive Metabolic Panel (CMET)  5. 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  Dispense: 90 tablet; Refill: 1 - Uric acid  6. Type 2 diabetes mellitus without complication, without long-term current use of insulin (HCC) A1c at goal, continue metformin. - POCT HgB A1C - POCT Glucose (CBG)  7. Anxiety Stable. Continue on clonazepam.. Recheck in 3 months. - clonazePAM (KLONOPIN) 1 MG tablet; Take 1 tablet (1 mg total) by mouth 2 (two) times daily as needed.   Dispense: 60 tablet; Refill: 2  Katerra Ingman [facility] 06/03/2015 8:42 AM

## 2015-06-04 LAB — LIPID PANEL
CHOL/HDL RATIO: 3.8 ratio (ref 0.0–5.0)
CHOLESTEROL TOTAL: 135 mg/dL (ref 100–199)
HDL: 36 mg/dL — AB (ref >39)
LDL CALC: 76 mg/dL (ref 0–99)
TRIGLYCERIDES: 113 mg/dL (ref 0–149)
VLDL Cholesterol Cal: 23 mg/dL (ref 5–40)

## 2015-06-04 LAB — URIC ACID: Uric Acid: 4.1 mg/dL (ref 3.7–8.6)

## 2015-06-04 LAB — COMPREHENSIVE METABOLIC PANEL
A/G RATIO: 1.8 (ref 1.1–2.5)
ALT: 19 IU/L (ref 0–44)
AST: 13 IU/L (ref 0–40)
Albumin: 4.3 g/dL (ref 3.6–4.8)
Alkaline Phosphatase: 92 IU/L (ref 39–117)
BILIRUBIN TOTAL: 0.5 mg/dL (ref 0.0–1.2)
BUN/Creatinine Ratio: 13 (ref 10–22)
BUN: 12 mg/dL (ref 8–27)
CALCIUM: 8.9 mg/dL (ref 8.6–10.2)
CHLORIDE: 101 mmol/L (ref 96–106)
CO2: 25 mmol/L (ref 18–29)
Creatinine, Ser: 0.93 mg/dL (ref 0.76–1.27)
GFR, EST AFRICAN AMERICAN: 100 mL/min/{1.73_m2} (ref >59)
GFR, EST NON AFRICAN AMERICAN: 86 mL/min/{1.73_m2} (ref >59)
GLOBULIN, TOTAL: 2.4 g/dL (ref 1.5–4.5)
Glucose: 168 mg/dL — ABNORMAL HIGH (ref 65–99)
POTASSIUM: 3.5 mmol/L (ref 3.5–5.2)
SODIUM: 146 mmol/L — AB (ref 134–144)
TOTAL PROTEIN: 6.7 g/dL (ref 6.0–8.5)

## 2015-07-02 ENCOUNTER — Other Ambulatory Visit: Payer: Self-pay | Admitting: Family Medicine

## 2015-07-02 DIAGNOSIS — M545 Low back pain: Principal | ICD-10-CM

## 2015-07-02 DIAGNOSIS — G8929 Other chronic pain: Secondary | ICD-10-CM

## 2015-07-02 DIAGNOSIS — M170 Bilateral primary osteoarthritis of knee: Secondary | ICD-10-CM

## 2015-07-02 NOTE — Telephone Encounter (Signed)
Pt needs refill on Hydrocodone °

## 2015-07-05 NOTE — Telephone Encounter (Signed)
Routed to Dr. Shah for medication refill approval 

## 2015-07-06 MED ORDER — HYDROCODONE-ACETAMINOPHEN 10-325 MG PO TABS: 1.0000 | ORAL_TABLET | Freq: Three times a day (TID) | ORAL | Status: DC | PRN

## 2015-08-03 ENCOUNTER — Telehealth: Payer: Self-pay | Admitting: Family Medicine

## 2015-08-03 ENCOUNTER — Other Ambulatory Visit: Payer: Self-pay | Admitting: Family Medicine

## 2015-08-03 DIAGNOSIS — M545 Low back pain: Principal | ICD-10-CM

## 2015-08-03 DIAGNOSIS — M170 Bilateral primary osteoarthritis of knee: Secondary | ICD-10-CM

## 2015-08-03 DIAGNOSIS — G8929 Other chronic pain: Secondary | ICD-10-CM

## 2015-08-03 MED ORDER — HYDROCODONE-ACETAMINOPHEN 10-325 MG PO TABS: 1.0000 | ORAL_TABLET | Freq: Three times a day (TID) | ORAL | Status: DC | PRN

## 2015-08-03 NOTE — Telephone Encounter (Signed)
Requesting refill on hydrocodone 10/325. Patient is completely out

## 2015-08-03 NOTE — Telephone Encounter (Signed)
Prescription for hydrocodone is ready for pickup.

## 2015-08-05 ENCOUNTER — Other Ambulatory Visit: Payer: Self-pay | Admitting: Family Medicine

## 2015-08-05 ENCOUNTER — Other Ambulatory Visit: Payer: Self-pay

## 2015-08-05 DIAGNOSIS — Z0189 Encounter for other specified special examinations: Secondary | ICD-10-CM | POA: Diagnosis not present

## 2015-08-05 DIAGNOSIS — Z0283 Encounter for blood-alcohol and blood-drug test: Secondary | ICD-10-CM

## 2015-08-06 LAB — DRUG SCREEN, URINE
AMPHETAMINES, URINE: NEGATIVE ng/mL
BARBITURATE SCREEN URINE: NEGATIVE ng/mL
Benzodiazepine Quant, Ur: NEGATIVE ng/mL
COCAINE (METAB.): NEGATIVE ng/mL
Cannabinoid Quant, Ur: NEGATIVE ng/mL
Opiate Quant, Ur: POSITIVE ng/mL
PCP Quant, Ur: NEGATIVE ng/mL

## 2015-09-03 ENCOUNTER — Encounter: Payer: Self-pay | Admitting: Family Medicine

## 2015-09-03 ENCOUNTER — Ambulatory Visit (INDEPENDENT_AMBULATORY_CARE_PROVIDER_SITE_OTHER): Payer: Medicare PPO | Admitting: Family Medicine

## 2015-09-03 DIAGNOSIS — F331 Major depressive disorder, recurrent, moderate: Secondary | ICD-10-CM

## 2015-09-03 DIAGNOSIS — M170 Bilateral primary osteoarthritis of knee: Secondary | ICD-10-CM

## 2015-09-03 DIAGNOSIS — E119 Type 2 diabetes mellitus without complications: Secondary | ICD-10-CM | POA: Diagnosis not present

## 2015-09-03 DIAGNOSIS — I10 Essential (primary) hypertension: Secondary | ICD-10-CM | POA: Diagnosis not present

## 2015-09-03 DIAGNOSIS — N400 Enlarged prostate without lower urinary tract symptoms: Secondary | ICD-10-CM

## 2015-09-03 LAB — POCT GLYCOSYLATED HEMOGLOBIN (HGB A1C): HEMOGLOBIN A1C: 7.6

## 2015-09-03 LAB — GLUCOSE, POCT (MANUAL RESULT ENTRY): POC GLUCOSE: 174 mg/dL — AB (ref 70–99)

## 2015-09-03 MED ORDER — FLUOXETINE HCL 40 MG PO CAPS: 40.0000 mg | ORAL_CAPSULE | ORAL | Status: DC

## 2015-09-03 MED ORDER — TAMSULOSIN HCL 0.4 MG PO CAPS: 0.4000 mg | ORAL_CAPSULE | Freq: Every day | ORAL | Status: DC

## 2015-09-03 MED ORDER — AMLODIPINE BESYLATE 10 MG PO TABS: 10.0000 mg | ORAL_TABLET | Freq: Every day | ORAL | Status: DC

## 2015-09-03 MED ORDER — LOSARTAN POTASSIUM 100 MG PO TABS: 100.0000 mg | ORAL_TABLET | Freq: Every day | ORAL | Status: DC

## 2015-09-03 MED ORDER — HYDROCODONE-ACETAMINOPHEN 10-325 MG PO TABS: 1.0000 | ORAL_TABLET | Freq: Three times a day (TID) | ORAL | Status: DC | PRN

## 2015-09-03 MED ORDER — METOPROLOL SUCCINATE ER 25 MG PO TB24: 25.0000 mg | ORAL_TABLET | ORAL | Status: DC

## 2015-09-03 MED ORDER — METOPROLOL SUCCINATE ER 100 MG PO TB24: 100.0000 mg | ORAL_TABLET | ORAL | Status: DC

## 2015-09-03 NOTE — Progress Notes (Signed)
Name: Evan Rivas   MRN: [id number]    DOB: Apr 09, 1950   Date:09/03/2015       Progress Note  Subjective  Chief Complaint  Chief Complaint  Patient presents with  . Follow-up    3 mo  . Diabetes  . Medication Refill    Diabetes He presents for his follow-up diabetic visit. He has type 2 diabetes mellitus. His disease course has been worsening. Pertinent negatives for hypoglycemia include no headaches or nervousness/anxiousness. Pertinent negatives for diabetes include no chest pain and no fatigue. Symptoms are stable. Pertinent negatives for diabetic complications include no CVA. Current diabetic treatment includes oral agent (monotherapy). His weight is stable. He participates in exercise three times a week. His dinner blood glucose range is generally 140-180 mg/dl. An ACE inhibitor/angiotensin II receptor blocker is being taken.  Hypertension This is a chronic problem. The problem is controlled. Pertinent negatives include no chest pain, headaches, palpitations or shortness of breath. Past treatments include angiotensin blockers and beta blockers. There is no history of kidney disease, CAD/MI or CVA.  Depression        This is a chronic problem.  Associated symptoms include decreased interest.  Associated symptoms include no fatigue, no helplessness, no hopelessness, does not have insomnia, no myalgias, no headaches and not sad.  Past treatments include SSRIs - Selective serotonin reuptake inhibitors.  Compliance with treatment is good. Arthritis Presents for follow-up visit. The disease course has been stable. He complains of pain, stiffness and joint swelling. Affected locations include the right knee and left knee. Pertinent negatives include no dysuria, fatigue or fever. Past treatments include an opioid. The treatment provided significant relief.  Benign Prostatic Hypertrophy This is a chronic problem. Irritative symptoms do not include nocturia. Obstructive symptoms do not include  dribbling, a slower stream or straining. Pertinent negatives include no chills, dysuria or hematuria. Past treatments include tamsulosin.     Past Medical History  Diagnosis Date  . Arthritis   . Hypertension   . Hyperlipidemia   . Depression   . Diabetes mellitus without complication ([place])   . Anxiety     panic attacks  . Cancer (HCC)     lip  . Gout     Past Surgical History  Procedure Laterality Date  . Hernia repair    . Arthroscopic repair acl Left   . Mohs surgery      lip  . Cystoscopy/ureteroscopy/holmium laser/stent placement Right 08/17/2014    Procedure: CYSTOSCOPY/URETEROSCOPY//STENT PLACEMENT/ attempt of lithotripsy;  Surgeon: Hollice Espy, MD;  Location: [facility];  Service: Urology;  Laterality: Right;  . Ureteroscopy with holmium laser lithotripsy Right 09/07/2014    Procedure: URETEROSCOPY WITH HOLMIUM LASER LITHOTRIPSY;  Surgeon: Hollice Espy, MD;  Location: [facility];  Service: Urology;  Laterality: Right;  . Cystoscopy w/ ureteral stent placement Right 09/07/2014    Procedure: CYSTOSCOPY WITH STENT REPLACEMENT;  Surgeon: Hollice Espy, MD;  Location: [facility];  Service: Urology;  Laterality: Right;    Family History  Problem Relation Age of Onset  . Hypertension Mother   . Cancer Mother   . Stroke Father   . Depression Sister     Social History   Social History  . Marital Status: Married    Spouse Name: N/A  . Number of Children: N/A  . Years of Education: N/A   Occupational History  . Not on file.   Social History Main Topics  . Smoking status: Never Smoker   . Smokeless tobacco: Never Used  .  Alcohol Use: No  . Drug Use: No  . Sexual Activity: Yes   Other Topics Concern  . Not on file   Social History Narrative     Current outpatient prescriptions:  .  allopurinol (ZYLOPRIM) 300 MG tablet, Take 1 tablet (300 mg total) by mouth daily., Disp: 90 tablet, Rfl: 1 .  amLODipine (NORVASC) 10 MG tablet, Take 1 tablet (10 mg total) by  mouth daily., Disp: 90 tablet, Rfl: 1 .  aspirin EC 81 MG tablet, Take 81 mg by mouth daily., Disp: , Rfl:  .  atorvastatin (LIPITOR) 40 MG tablet, Take 1 tablet (40 mg total) by mouth at bedtime. At Bedtime, Disp: 90 tablet, Rfl: 1 .  clonazePAM (KLONOPIN) 1 MG tablet, Take 1 tablet (1 mg total) by mouth 2 (two) times daily as needed., Disp: 60 tablet, Rfl: 2 .  FLUoxetine (PROZAC) 40 MG capsule, Take 1 capsule (40 mg total) by mouth every morning., Disp: 90 capsule, Rfl: 1 .  fluticasone (FLONASE) 50 MCG/ACT nasal spray, Place 2 sprays into both nostrils daily. 2 sprays, Disp: 16 g, Rfl: 0 .  HYDROcodone-acetaminophen (NORCO) 10-325 MG tablet, Take 1 tablet by mouth 3 (three) times daily as needed for moderate pain. 10-325, Disp: 90 tablet, Rfl: 0 .  losartan (COZAAR) 100 MG tablet, Take 1 tablet (100 mg total) by mouth daily., Disp: 90 tablet, Rfl: 0 .  metFORMIN (GLUCOPHAGE-XR) 500 MG 24 hr tablet, Take 2 tablets (1,000 mg total) by mouth 2 (two) times daily. 1000mg  BID, Disp: 360 tablet, Rfl: 1 .  metoprolol succinate (TOPROL-XL) 100 MG 24 hr tablet, Take 1 tablet (100 mg total) by mouth every morning., Disp: 90 tablet, Rfl: 0 .  metoprolol succinate (TOPROL-XL) 25 MG 24 hr tablet, Take 1 tablet (25 mg total) by mouth every morning., Disp: 90 tablet, Rfl: 1 .  MULTIPLE VITAMIN PO, Take by mouth daily., Disp: , Rfl:  .  nabumetone (RELAFEN) 500 MG tablet, Take 1 tablet (500 mg total) by mouth 2 (two) times daily., Disp: 180 tablet, Rfl: 1 .  tamsulosin (FLOMAX) 0.4 MG CAPS capsule, Take 0.4 mg by mouth daily after supper., Disp: , Rfl:  .  tiZANidine (ZANAFLEX) 4 MG tablet, Take 1 tablet (4 mg total) by mouth as needed., Disp: 90 tablet, Rfl: 2  Allergies  Allergen Reactions  . 2,4-D Dimethylamine (Amisol) Nausea And Vomiting  . Celebrex  [Celecoxib]     GI  . Tetanus Toxoids Other (See Comments)    unkown  . Tetanus-Diphtheria Toxoids Td     Other reaction(s): Unknown     Review of  Systems  Constitutional: Negative for fever, chills and fatigue.  Respiratory: Negative for shortness of breath.   Cardiovascular: Negative for chest pain and palpitations.  Gastrointestinal: Negative for abdominal pain.  Genitourinary: Negative for dysuria, hematuria and nocturia.  Musculoskeletal: Positive for back pain, joint pain, joint swelling, arthritis and stiffness. Negative for myalgias.  Neurological: Negative for headaches.  Psychiatric/Behavioral: Positive for depression. The patient is not nervous/anxious and does not have insomnia.      Objective  Filed Vitals:   09/03/15 0844  BP: 133/77  Pulse: 87  Temp: 98.7 F (37.1 C)  TempSrc: Oral  Resp: 16  Height: 6\' 2"  (1.88 m)  Weight: 262 lb 14.4 oz (119.251 kg)  SpO2: 94%    Physical Exam  Constitutional: He is oriented to person, place, and time and well-developed, well-nourished, and in no distress.  HENT:  Head: Normocephalic and atraumatic.  Cardiovascular: Normal rate and regular rhythm.   Pulmonary/Chest: Effort normal and breath sounds normal.  Abdominal: Soft. Bowel sounds are normal.  Musculoskeletal:       Right knee: He exhibits no swelling. No tenderness found.       Left knee: He exhibits no swelling. No tenderness found.  Neurological: He is alert and oriented to person, place, and time.  Psychiatric: Mood, memory, affect and judgment normal.  Nursing note and vitals reviewed.    Assessment & Plan  1. Primary osteoarthritis of both knees Worse after moving to a new house and being on his knees. He is considering consultation with orthopedics. Prescription for hydrocodone provided - HYDROcodone-acetaminophen (NORCO) 10-325 MG tablet; Take 1 tablet by mouth 3 (three) times daily as needed for moderate pain. 10-325  Dispense: 90 tablet; Refill: 0  2. Essential hypertension Blood pressure is stable and controlled on present and evidence of therapy - amLODipine (NORVASC) 10 MG tablet; Take 1  tablet (10 mg total) by mouth daily.  Dispense: 90 tablet; Refill: 1 - losartan (COZAAR) 100 MG tablet; Take 1 tablet (100 mg total) by mouth daily.  Dispense: 90 tablet; Refill: 0 - metoprolol succinate (TOPROL-XL) 100 MG 24 hr tablet; Take 1 tablet (100 mg total) by mouth every morning.  Dispense: 90 tablet; Refill: 0 - metoprolol succinate (TOPROL-XL) 25 MG 24 hr tablet; Take 1 tablet (25 mg total) by mouth every morning.  Dispense: 90 tablet; Refill: 1  3. Major depressive disorder, recurrent episode, moderate (HCC)  - FLUoxetine (PROZAC) 40 MG capsule; Take 1 capsule (40 mg total) by mouth every morning.  Dispense: 90 capsule; Refill: 1  4. BPH without urinary obstruction  - tamsulosin (FLOMAX) 0.4 MG CAPS capsule; Take 1 capsule (0.4 mg total) by mouth daily after supper.  Dispense: 90 capsule; Refill: 0  5. Type 2 diabetes mellitus without complication, without long-term current use of insulin (HCC) A1c is worse 7.6%, up from 7.1%. Has not been very active recently which may explain the rise in A1c. Has restarted his physical activities. Recheck in 3 months - POCT HgB A1C - POCT Glucose (CBG) - Urine Microalbumin w/creat. ratio  Staphanie Harbison [facility] 09/03/2015 8:51 AM

## 2015-09-04 LAB — MICROALBUMIN / CREATININE URINE RATIO
Creatinine, Urine: 69.7 mg/dL
MICROALB/CREAT RATIO: 31.3 mg/g creat — ABNORMAL HIGH (ref 0.0–30.0)
Microalbumin, Urine: 21.8 ug/mL

## 2015-09-09 ENCOUNTER — Other Ambulatory Visit: Payer: Self-pay | Admitting: Family Medicine

## 2015-09-27 ENCOUNTER — Other Ambulatory Visit: Payer: Self-pay | Admitting: Family Medicine

## 2015-09-27 DIAGNOSIS — M170 Bilateral primary osteoarthritis of knee: Secondary | ICD-10-CM

## 2015-09-27 NOTE — Telephone Encounter (Signed)
Requesting refill on Hydrocodone 10-325 mg & One Touch Ultra Test Strips. Patient understands that the Hydrocodone is not due until July 2, however, he will be going out of town and would need it by Friday. Requesting that you print both prescriptions out and he will take them to the pharmacy.

## 2015-09-30 NOTE — Telephone Encounter (Signed)
Patient is checking status on his medication refill. He is going out of town this weekend and will be gone all next week and is really needing this prescription. Asking to pick it up tomorrow before you leave.

## 2015-10-01 MED ORDER — HYDROCODONE-ACETAMINOPHEN 10-325 MG PO TABS: 1.0000 | ORAL_TABLET | Freq: Three times a day (TID) | ORAL | Status: DC | PRN

## 2015-10-01 NOTE — Telephone Encounter (Signed)
Medication has been refilled and is ready for patient pick up per Dr. Manuella Ghazi

## 2015-10-08 ENCOUNTER — Telehealth: Payer: Self-pay | Admitting: Family Medicine

## 2015-10-08 DIAGNOSIS — E119 Type 2 diabetes mellitus without complications: Secondary | ICD-10-CM

## 2015-10-08 MED ORDER — METFORMIN HCL ER 500 MG PO TB24: 1000.0000 mg | ORAL_TABLET | Freq: Two times a day (BID) | ORAL | Status: DC

## 2015-10-08 MED ORDER — GLUCOSE BLOOD VI STRP: ORAL_STRIP | Status: DC

## 2015-10-08 NOTE — Telephone Encounter (Signed)
Patient is requesting refill on One Touch Ultra Test Strips (was told by Beau Fanny that it was called in but it is not at any of the pharmacies). Also requesting Metformin ER 500mg  AB1. Please send both to [facility]. He is also asking that someone give him a call once this is complete. I also suggested for him to check MyChart after hours if he does not hear anything from [place] today.

## 2015-10-08 NOTE — Telephone Encounter (Signed)
Prescription for metformin and One Touch ultra test strips is sent to pharmacy

## 2015-10-08 NOTE — Telephone Encounter (Signed)
Patient informed. 

## 2015-10-29 ENCOUNTER — Telehealth: Payer: Self-pay | Admitting: Family Medicine

## 2015-11-02 ENCOUNTER — Other Ambulatory Visit: Payer: Self-pay | Admitting: Family Medicine

## 2015-11-02 DIAGNOSIS — M170 Bilateral primary osteoarthritis of knee: Secondary | ICD-10-CM

## 2015-11-08 ENCOUNTER — Telehealth: Payer: Self-pay | Admitting: Family Medicine

## 2015-11-08 MED ORDER — HYDROCODONE-ACETAMINOPHEN 10-325 MG PO TABS: 1.0000 | ORAL_TABLET | Freq: Three times a day (TID) | ORAL | 0 refills | Status: DC | PRN

## 2015-11-08 NOTE — Telephone Encounter (Signed)
Pt notified that RX is ready

## 2015-11-16 NOTE — Telephone Encounter (Signed)
COMPLETED

## 2015-11-26 DIAGNOSIS — M1711 Unilateral primary osteoarthritis, right knee: Secondary | ICD-10-CM | POA: Diagnosis not present

## 2015-11-26 DIAGNOSIS — M170 Bilateral primary osteoarthritis of knee: Secondary | ICD-10-CM | POA: Diagnosis not present

## 2015-11-26 DIAGNOSIS — M7061 Trochanteric bursitis, right hip: Secondary | ICD-10-CM | POA: Diagnosis not present

## 2015-11-26 DIAGNOSIS — M1712 Unilateral primary osteoarthritis, left knee: Secondary | ICD-10-CM | POA: Diagnosis not present

## 2015-12-03 ENCOUNTER — Ambulatory Visit: Payer: Medicare PPO | Admitting: Family Medicine

## 2015-12-08 ENCOUNTER — Encounter: Payer: Self-pay | Admitting: Family Medicine

## 2015-12-08 ENCOUNTER — Ambulatory Visit (INDEPENDENT_AMBULATORY_CARE_PROVIDER_SITE_OTHER): Payer: Medicare PPO | Admitting: Family Medicine

## 2015-12-08 VITALS — BP 130/74 | HR 84 | Temp 98.3°F | Resp 18 | Ht 74.0 in | Wt 261.4 lb

## 2015-12-08 DIAGNOSIS — N400 Enlarged prostate without lower urinary tract symptoms: Secondary | ICD-10-CM

## 2015-12-08 DIAGNOSIS — J302 Other seasonal allergic rhinitis: Secondary | ICD-10-CM

## 2015-12-08 DIAGNOSIS — M1A4720 Other secondary chronic gout, left ankle and foot, without tophus (tophi): Secondary | ICD-10-CM

## 2015-12-08 DIAGNOSIS — F331 Major depressive disorder, recurrent, moderate: Secondary | ICD-10-CM | POA: Diagnosis not present

## 2015-12-08 DIAGNOSIS — E785 Hyperlipidemia, unspecified: Secondary | ICD-10-CM | POA: Diagnosis not present

## 2015-12-08 DIAGNOSIS — M170 Bilateral primary osteoarthritis of knee: Secondary | ICD-10-CM

## 2015-12-08 DIAGNOSIS — E119 Type 2 diabetes mellitus without complications: Secondary | ICD-10-CM

## 2015-12-08 DIAGNOSIS — F419 Anxiety disorder, unspecified: Secondary | ICD-10-CM

## 2015-12-08 DIAGNOSIS — I10 Essential (primary) hypertension: Secondary | ICD-10-CM

## 2015-12-08 LAB — POCT GLYCOSYLATED HEMOGLOBIN (HGB A1C): HEMOGLOBIN A1C: 8.5

## 2015-12-08 LAB — GLUCOSE, POCT (MANUAL RESULT ENTRY): POC Glucose: 170 mg/dl — AB (ref 70–99)

## 2015-12-08 MED ORDER — ATORVASTATIN CALCIUM 40 MG PO TABS: 40.0000 mg | ORAL_TABLET | Freq: Every day | ORAL | 1 refills | Status: DC

## 2015-12-08 MED ORDER — TAMSULOSIN HCL 0.4 MG PO CAPS: 0.4000 mg | ORAL_CAPSULE | Freq: Every day | ORAL | 0 refills | Status: DC

## 2015-12-08 MED ORDER — AMLODIPINE BESYLATE 10 MG PO TABS: 10.0000 mg | ORAL_TABLET | Freq: Every day | ORAL | 1 refills | Status: DC

## 2015-12-08 MED ORDER — ALLOPURINOL 300 MG PO TABS: 300.0000 mg | ORAL_TABLET | Freq: Every day | ORAL | 1 refills | Status: DC

## 2015-12-08 MED ORDER — METOPROLOL SUCCINATE ER 100 MG PO TB24: 100.0000 mg | ORAL_TABLET | ORAL | 0 refills | Status: DC

## 2015-12-08 MED ORDER — SITAGLIPTIN PHOSPHATE 100 MG PO TABS: 100.0000 mg | ORAL_TABLET | Freq: Every day | ORAL | 0 refills | Status: DC

## 2015-12-08 MED ORDER — FLUOXETINE HCL 40 MG PO CAPS: 40.0000 mg | ORAL_CAPSULE | ORAL | 1 refills | Status: DC

## 2015-12-08 MED ORDER — LOSARTAN POTASSIUM 100 MG PO TABS: 100.0000 mg | ORAL_TABLET | Freq: Every day | ORAL | 0 refills | Status: DC

## 2015-12-08 MED ORDER — FLUTICASONE PROPIONATE 50 MCG/ACT NA SUSP: 2.0000 | Freq: Every day | NASAL | 0 refills | Status: DC

## 2015-12-08 MED ORDER — NABUMETONE 500 MG PO TABS: 500.0000 mg | ORAL_TABLET | Freq: Two times a day (BID) | ORAL | 1 refills | Status: DC

## 2015-12-08 MED ORDER — HYDROCODONE-ACETAMINOPHEN 10-325 MG PO TABS: 1.0000 | ORAL_TABLET | Freq: Three times a day (TID) | ORAL | 0 refills | Status: DC | PRN

## 2015-12-08 MED ORDER — METOPROLOL SUCCINATE ER 25 MG PO TB24: 25.0000 mg | ORAL_TABLET | ORAL | 1 refills | Status: DC

## 2015-12-08 MED ORDER — GLUCOSE BLOOD VI STRP: ORAL_STRIP | 11 refills | Status: DC

## 2015-12-08 MED ORDER — CLONAZEPAM 1 MG PO TABS: 1.0000 mg | ORAL_TABLET | Freq: Two times a day (BID) | ORAL | 2 refills | Status: DC | PRN

## 2015-12-08 MED ORDER — CLONAZEPAM 1 MG PO TABS: 1.0000 mg | ORAL_TABLET | Freq: Two times a day (BID) | ORAL | 0 refills | Status: DC | PRN

## 2015-12-08 NOTE — Progress Notes (Signed)
Name: Evan Rivas   MRN: [id number]    DOB: [birth year]   Date:12/08/2015       Progress Note  Subjective  Chief Complaint  Chief Complaint  Patient presents with  . Diabetes  . Depression  . Hyperlipidemia  . Pain    Diabetes  He presents for his follow-up diabetic visit. He has type 2 diabetes mellitus. His disease course has been worsening. Hypoglycemia symptoms include nervousness/anxiousness. Pertinent negatives for hypoglycemia include no headaches. Pertinent negatives for diabetes include no blurred vision, no chest pain, no polydipsia and no polyuria. Pertinent negatives for diabetic complications include no CVA, heart disease or peripheral neuropathy. Current diabetic treatment includes oral agent (monotherapy). He is following a diabetic diet. He participates in exercise daily. His breakfast blood glucose range is generally 140-180 mg/dl. An ACE inhibitor/angiotensin II receptor blocker is being taken.          Associated symptoms include no headaches. Hyperlipidemia  This is a chronic problem. The problem is controlled. Recent lipid tests were reviewed and are normal. Exacerbating diseases include diabetes. Pertinent negatives include no chest pain or shortness of breath. Current antihyperlipidemic treatment includes statins.  Hypertension  This is a chronic problem. The problem is unchanged. The problem is controlled. Associated symptoms include malaise/fatigue. Pertinent negatives include no blurred vision, chest pain, headaches, palpitations or shortness of breath. Past treatments include beta blockers, angiotensin blockers and calcium channel blockers. There is no history of kidney disease, CAD/MI or CVA. There is no history of hyperaldosteronism or hypercortisolism.     Past Medical History:  Diagnosis Date  . Anxiety    panic attacks  . Arthritis   . Cancer (HCC)    lip  . Depression   . Diabetes mellitus without complication ([place])   . Gout   . Hyperlipidemia    . Hypertension     Past Surgical History:  Procedure Laterality Date  . ARTHROSCOPIC REPAIR ACL Left   . CYSTOSCOPY W/ URETERAL STENT PLACEMENT Right 09/07/2014   Procedure: CYSTOSCOPY WITH STENT REPLACEMENT;  Surgeon: Hollice Espy, MD;  Location: [facility];  Service: Urology;  Laterality: Right;  . CYSTOSCOPY/URETEROSCOPY/HOLMIUM LASER/STENT PLACEMENT Right 08/17/2014   Procedure: CYSTOSCOPY/URETEROSCOPY//STENT PLACEMENT/ attempt of lithotripsy;  Surgeon: Hollice Espy, MD;  Location: [facility];  Service: Urology;  Laterality: Right;  . HERNIA REPAIR    . MOHS SURGERY     lip  . URETEROSCOPY WITH HOLMIUM LASER LITHOTRIPSY Right 09/07/2014   Procedure: URETEROSCOPY WITH HOLMIUM LASER LITHOTRIPSY;  Surgeon: Hollice Espy, MD;  Location: [facility];  Service: Urology;  Laterality: Right;    Family History  Problem Relation Age of Onset  . Hypertension Mother   . Cancer Mother   . Stroke Father   . Depression Sister     Social History   Social History  . Marital status: Married    Spouse name: N/A  . Number of children: N/A  . Years of education: N/A   Occupational History  . Not on file.   Social History Main Topics  . Smoking status: Never Smoker  . Smokeless tobacco: Never Used  . Alcohol use No  . Drug use: No  . Sexual activity: Yes   Other Topics Concern  . Not on file   Social History Narrative  . No narrative on file     Current Outpatient Prescriptions:  .  allopurinol (ZYLOPRIM) 300 MG tablet, Take 1 tablet (300 mg total) by mouth daily., Disp: 90 tablet, Rfl: 1 .  amLODipine (  NORVASC) 10 MG tablet, Take 1 tablet (10 mg total) by mouth daily., Disp: 90 tablet, Rfl: 1 .  aspirin EC 81 MG tablet, Take 81 mg by mouth daily., Disp: , Rfl:  .  atorvastatin (LIPITOR) 40 MG tablet, Take 1 tablet (40 mg total) by mouth at bedtime. At Bedtime, Disp: 90 tablet, Rfl: 1 .  clonazePAM (KLONOPIN) 1 MG tablet, Take 1 tablet (1 mg total) by mouth 2 (two) times daily as  needed., Disp: 60 tablet, Rfl: 2 .  FLUoxetine (PROZAC) 40 MG capsule, Take 1 capsule (40 mg total) by mouth every morning., Disp: 90 capsule, Rfl: 1 .  fluticasone (FLONASE) 50 MCG/ACT nasal spray, Place 2 sprays into both nostrils daily. 2 sprays, Disp: 16 g, Rfl: 0 .  glucose blood test strip, Use as instructed, Disp: 100 each, Rfl: 11 .  HYDROcodone-acetaminophen (NORCO) 10-325 MG tablet, Take 1 tablet by mouth 3 (three) times daily as needed for moderate pain. 10-325, Disp: 90 tablet, Rfl: 0 .  losartan (COZAAR) 100 MG tablet, Take 1 tablet (100 mg total) by mouth daily., Disp: 90 tablet, Rfl: 0 .  metFORMIN (GLUCOPHAGE-XR) 500 MG 24 hr tablet, Take 2 tablets (1,000 mg total) by mouth 2 (two) times daily. 1000mg  BID, Disp: 360 tablet, Rfl: 1 .  metoprolol succinate (TOPROL-XL) 100 MG 24 hr tablet, Take 1 tablet (100 mg total) by mouth every morning., Disp: 90 tablet, Rfl: 0 .  metoprolol succinate (TOPROL-XL) 25 MG 24 hr tablet, Take 1 tablet (25 mg total) by mouth every morning., Disp: 90 tablet, Rfl: 1 .  MULTIPLE VITAMIN PO, Take by mouth daily., Disp: , Rfl:  .  nabumetone (RELAFEN) 500 MG tablet, TAKE 1 TABLET TWICE DAILY, Disp: 180 tablet, Rfl: 1 .  tamsulosin (FLOMAX) 0.4 MG CAPS capsule, Take 1 capsule (0.4 mg total) by mouth daily after supper., Disp: 90 capsule, Rfl: 0 .  tiZANidine (ZANAFLEX) 4 MG tablet, Take 1 tablet (4 mg total) by mouth as needed., Disp: 90 tablet, Rfl: 2  Allergies  Allergen Reactions  . 2,4-D Dimethylamine (Amisol) Nausea And Vomiting  . Celebrex  [Celecoxib]     GI  . Tetanus Toxoids Other (See Comments)    unkown  . Tetanus-Diphtheria Toxoids Td     Other reaction(s): Unknown    Review of Systems  Constitutional: Positive for malaise/fatigue. Negative for chills and fever.  Eyes: Negative for blurred vision.  Respiratory: Negative for shortness of breath.   Cardiovascular: Negative for chest pain and palpitations.  Gastrointestinal: Negative for  abdominal pain, constipation, diarrhea, nausea and vomiting.  Musculoskeletal: Positive for back pain and joint pain.  Neurological: Negative for headaches.  Endo/Heme/Allergies: Negative for polydipsia.  Psychiatric/Behavioral: Positive for depression. The patient is nervous/anxious.     Objective  Vitals:   12/08/15 0924  BP: 130/74  Pulse: 84  Resp: 18  Temp: 98.3 F (36.8 C)  SpO2: 93%  Weight: 261 lb 7 oz (118.6 kg)  Height: 6\' 2"  (1.88 m)    Physical Exam  Constitutional: He is oriented to person, place, and time and well-developed, well-nourished, and in no distress.  HENT:  Head: Normocephalic and atraumatic.  Cardiovascular: Normal rate, regular rhythm, S1 normal, S2 normal and normal heart sounds.   No murmur heard. Pulmonary/Chest: Effort normal and breath sounds normal. He has no wheezes.  Abdominal: Soft. Bowel sounds are normal. There is no tenderness.  Musculoskeletal:       Right knee: He exhibits no swelling. No tenderness found.  Left knee: He exhibits no swelling. No tenderness found.       Right ankle: He exhibits no swelling.       Left ankle: He exhibits no swelling.  Neurological: He is alert and oriented to person, place, and time.  Skin: Skin is warm and dry.  Psychiatric: Mood, memory, affect and judgment normal.  Nursing note and vitals reviewed.      Recent Results (from the past 2160 hour(s))  POCT Glucose (CBG)     Status: Abnormal   Collection Time: 12/08/15  9:45 AM  Result Value Ref Range   POC Glucose 170 (A) 70 - 99 mg/dl  POCT HgB A1C     Status: None   Collection Time: 12/08/15  9:46 AM  Result Value Ref Range   Hemoglobin A1C 8.5      Assessment & Plan  1. Type 2 diabetes mellitus without complication, without long-term current use of insulin (HCC) A1c above goal at 8.5%, will start on Januvia 100 mg daily for optimization, recheck A1c in 3 months - POCT HgB A1C - POCT Glucose (CBG) - glucose blood test strip; Use  as instructed  Dispense: 100 each; Refill: 11 - sitaGLIPtin (JANUVIA) 100 MG tablet; Take 1 tablet (100 mg total) by mouth daily.  Dispense: 90 tablet; Refill: 0  2. Anxiety Stable on clonazepam taken twice daily as needed - clonazePAM (KLONOPIN) 1 MG tablet; Take 1 tablet (1 mg total) by mouth 2 (two) times daily as needed.  Dispense: 60 tablet; Refill: 2  3. BPH without urinary obstruction Stable on Flomax - tamsulosin (FLOMAX) 0.4 MG CAPS capsule; Take 1 capsule (0.4 mg total) by mouth daily after supper.  Dispense: 90 capsule; Refill: 0  4. Essential hypertension Blood Pressure stable and controlled on present antihypertensive therapy - amLODipine (NORVASC) 10 MG tablet; Take 1 tablet (10 mg total) by mouth daily.  Dispense: 90 tablet; Refill: 1 - losartan (COZAAR) 100 MG tablet; Take 1 tablet (100 mg total) by mouth daily.  Dispense: 90 tablet; Refill: 0 - metoprolol succinate (TOPROL-XL) 100 MG 24 hr tablet; Take 1 tablet (100 mg total) by mouth every morning.  Dispense: 90 tablet; Refill: 0 - metoprolol succinate (TOPROL-XL) 25 MG 24 hr tablet; Take 1 tablet (25 mg total) by mouth every morning.  Dispense: 90 tablet; Refill: 1  5. Hyperlipidemia  - atorvastatin (LIPITOR) 40 MG tablet; Take 1 tablet (40 mg total) by mouth at bedtime. At Bedtime  Dispense: 90 tablet; Refill: 1 - Lipid Profile - COMPLETE METABOLIC PANEL WITH GFR  6. Major depressive disorder, recurrent episode, moderate (HCC)  - FLUoxetine (PROZAC) 40 MG capsule; Take 1 capsule (40 mg total) by mouth every morning.  Dispense: 90 capsule; Refill: 1  7. Other seasonal allergic rhinitis  - fluticasone (FLONASE) 50 MCG/ACT nasal spray; Place 2 sprays into both nostrils daily. 2 sprays  Dispense: 16 g; Refill: 0  8. 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  Dispense: 90 tablet; Refill: 1  9. Primary osteoarthritis  of both knees Stable, receiving injections in his knees by orthopedist, taking hydrocodone and Nabumetone as prescribed. Compliant with controlled substances agreement. Refills provided - HYDROcodone-acetaminophen (NORCO) 10-325 MG tablet; Take 1 tablet by mouth 3 (three) times daily as needed for moderate pain. 10-325  Dispense: 90 tablet; Refill: 0 - nabumetone (RELAFEN) 500 MG tablet; Take 1 tablet (500 mg total) by  mouth 2 (two) times daily.  Dispense: 180 tablet; Refill: 1   Brolin Dambrosia Asad A. [facility] 12/08/2015 9:53 AM

## 2015-12-15 ENCOUNTER — Telehealth: Payer: Self-pay | Admitting: Family Medicine

## 2015-12-16 NOTE — Telephone Encounter (Signed)
Patient was started on Januvia 100 mg daily and glimepiride is not an appropriate substitute for Januvia. He should consult his pharmacist for appropriate replacement therapy within the same class as Januvia

## 2015-12-17 NOTE — Telephone Encounter (Signed)
Pt will get a copy of the formulary and bring it by so Dr Manuella Ghazi can look at it and decide what is best. Pt states the pharmacy thinks there is nothing else.

## 2015-12-20 NOTE — Telephone Encounter (Signed)
Please schedule an appointment to discuss changes in medication

## 2015-12-20 NOTE — Telephone Encounter (Signed)
Pt scheduled an appt °

## 2015-12-22 ENCOUNTER — Encounter: Payer: Self-pay | Admitting: Family Medicine

## 2015-12-22 ENCOUNTER — Ambulatory Visit (INDEPENDENT_AMBULATORY_CARE_PROVIDER_SITE_OTHER): Payer: Medicare PPO | Admitting: Family Medicine

## 2015-12-22 VITALS — BP 133/72 | HR 89 | Temp 98.3°F | Resp 15 | Ht 74.0 in | Wt 262.0 lb

## 2015-12-22 DIAGNOSIS — E119 Type 2 diabetes mellitus without complications: Secondary | ICD-10-CM

## 2015-12-22 DIAGNOSIS — Z23 Encounter for immunization: Secondary | ICD-10-CM

## 2015-12-22 MED ORDER — SAXAGLIPTIN HCL 5 MG PO TABS: 5.0000 mg | ORAL_TABLET | Freq: Every day | ORAL | 0 refills | Status: DC

## 2015-12-22 NOTE — Progress Notes (Signed)
Name: Evan Rivas   MRN: [id number]    DOB: 07/09/1950   Date:12/22/2015       Progress Note  Subjective  Chief Complaint  Chief Complaint  Patient presents with  . Follow-up    Discuss medication change     HPI  Diabetes Mellitus: Pt. Presents to discuss change in mediation therapy, he was started on Januvia 100 mg daily for poorly controlled Diabetes, A1c at 8.5%. Januvia is not covered by his insurance and they are recommending Glimepiride as a substitute. I have recommended switching within the same class (DPP-4i). Will change to Onglyza today and pt. will bring me an updated formulary coverage.   Past Medical History:  Diagnosis Date  . Anxiety    panic attacks  . Arthritis   . Cancer (HCC)    lip  . Depression   . Diabetes mellitus without complication ([place])   . Gout   . Hyperlipidemia   . Hypertension     Past Surgical History:  Procedure Laterality Date  . ARTHROSCOPIC REPAIR ACL Left   . CYSTOSCOPY W/ URETERAL STENT PLACEMENT Right 09/07/2014   Procedure: CYSTOSCOPY WITH STENT REPLACEMENT;  Surgeon: Hollice Espy, MD;  Location: [facility];  Service: Urology;  Laterality: Right;  . CYSTOSCOPY/URETEROSCOPY/HOLMIUM LASER/STENT PLACEMENT Right 08/17/2014   Procedure: CYSTOSCOPY/URETEROSCOPY//STENT PLACEMENT/ attempt of lithotripsy;  Surgeon: Hollice Espy, MD;  Location: [facility];  Service: Urology;  Laterality: Right;  . HERNIA REPAIR    . MOHS SURGERY     lip  . URETEROSCOPY WITH HOLMIUM LASER LITHOTRIPSY Right 09/07/2014   Procedure: URETEROSCOPY WITH HOLMIUM LASER LITHOTRIPSY;  Surgeon: Hollice Espy, MD;  Location: [facility];  Service: Urology;  Laterality: Right;    Family History  Problem Relation Age of Onset  . Hypertension Mother   . Cancer Mother   . Stroke Father   . Depression Sister     Social History   Social History  . Marital status: Married    Spouse name: N/A  . Number of children: N/A  . Years of education: N/A   Occupational  History  . Not on file.   Social History Main Topics  . Smoking status: Never Smoker  . Smokeless tobacco: Never Used  . Alcohol use No  . Drug use: No  . Sexual activity: Yes   Other Topics Concern  . Not on file   Social History Narrative  . No narrative on file     Current Outpatient Prescriptions:  .  allopurinol (ZYLOPRIM) 300 MG tablet, Take 1 tablet (300 mg total) by mouth daily., Disp: 90 tablet, Rfl: 1 .  amLODipine (NORVASC) 10 MG tablet, Take 1 tablet (10 mg total) by mouth daily., Disp: 90 tablet, Rfl: 1 .  aspirin EC 81 MG tablet, Take 81 mg by mouth daily., Disp: , Rfl:  .  atorvastatin (LIPITOR) 40 MG tablet, Take 1 tablet (40 mg total) by mouth at bedtime. At Bedtime, Disp: 90 tablet, Rfl: 1 .  clonazePAM (KLONOPIN) 1 MG tablet, Take 1 tablet (1 mg total) by mouth 2 (two) times daily as needed., Disp: 180 tablet, Rfl: 0 .  FLUoxetine (PROZAC) 40 MG capsule, Take 1 capsule (40 mg total) by mouth every morning., Disp: 90 capsule, Rfl: 1 .  fluticasone (FLONASE) 50 MCG/ACT nasal spray, Place 2 sprays into both nostrils daily. 2 sprays, Disp: 16 g, Rfl: 0 .  glucose blood test strip, Use as instructed, Disp: 100 each, Rfl: 11 .  HYDROcodone-acetaminophen (NORCO) 10-325 MG tablet, Take  1 tablet by mouth 3 (three) times daily as needed for moderate pain. 10-325, Disp: 90 tablet, Rfl: 0 .  losartan (COZAAR) 100 MG tablet, Take 1 tablet (100 mg total) by mouth daily., Disp: 90 tablet, Rfl: 0 .  metFORMIN (GLUCOPHAGE-XR) 500 MG 24 hr tablet, Take 2 tablets (1,000 mg total) by mouth 2 (two) times daily. 1000mg  BID, Disp: 360 tablet, Rfl: 1 .  metoprolol succinate (TOPROL-XL) 100 MG 24 hr tablet, Take 1 tablet (100 mg total) by mouth every morning., Disp: 90 tablet, Rfl: 0 .  metoprolol succinate (TOPROL-XL) 25 MG 24 hr tablet, Take 1 tablet (25 mg total) by mouth every morning., Disp: 90 tablet, Rfl: 1 .  MULTIPLE VITAMIN PO, Take by mouth daily., Disp: , Rfl:  .  nabumetone  (RELAFEN) 500 MG tablet, Take 1 tablet (500 mg total) by mouth 2 (two) times daily., Disp: 180 tablet, Rfl: 1 .  tamsulosin (FLOMAX) 0.4 MG CAPS capsule, Take 1 capsule (0.4 mg total) by mouth daily after supper., Disp: 90 capsule, Rfl: 0 .  tiZANidine (ZANAFLEX) 4 MG tablet, Take 1 tablet (4 mg total) by mouth as needed., Disp: 90 tablet, Rfl: 2 .  sitaGLIPtin (JANUVIA) 100 MG tablet, Take 1 tablet (100 mg total) by mouth daily. (Patient not taking: Reported on 12/22/2015), Disp: 90 tablet, Rfl: 0  Allergies  Allergen Reactions  . 2,4-D Dimethylamine (Amisol) Nausea And Vomiting  . Celebrex  [Celecoxib]     GI  . Tetanus Toxoids Other (See Comments)    unkown  . Tetanus-Diphtheria Toxoids Td     Other reaction(s): Unknown     Review of Systems  Constitutional: Negative for chills, fever and malaise/fatigue.  Cardiovascular: Negative for chest pain.  Gastrointestinal: Negative for abdominal pain.      Objective  Vitals:   12/22/15 0824  BP: 133/72  Pulse: 89  Resp: 15  Temp: 98.3 F (36.8 C)  TempSrc: Oral  SpO2: 96%  Weight: 262 lb (118.8 kg)  Height: 6\' 2"  (1.88 m)    Physical Exam  Constitutional: He is oriented to person, place, and time and well-developed, well-nourished, and in no distress.  HENT:  Head: Normocephalic and atraumatic.  Neurological: He is alert and oriented to person, place, and time.  Psychiatric: Mood, memory, affect and judgment normal.  Nursing note and vitals reviewed.      Assessment & Plan  1. Type 2 diabetes mellitus without complication, without long-term current use of insulin ([place]) Explained that if Januvia is not covered, we'll try Onglyza. Glimepiride is not appropriate for patient because of risk of hypoglycemia. Patient will bring an updated in formulary from his insurance company for me to review. Prescription for Onglyza printed and provided to patient - saxagliptin HCl (ONGLYZA) 5 MG TABS tablet; Take 1 tablet (5 mg total)  by mouth daily.  Dispense: 90 tablet; Refill: 0  2. Need for immunization against influenza  - Flu vaccine HIGH DOSE PF (Fluzone High dose)   Orville Widmann Asad A. [facility] 12/22/2015 8:40 AM

## 2015-12-23 ENCOUNTER — Telehealth: Payer: Self-pay

## 2015-12-23 DIAGNOSIS — M170 Bilateral primary osteoarthritis of knee: Secondary | ICD-10-CM | POA: Diagnosis not present

## 2015-12-23 MED ORDER — GLIPIZIDE 5 MG PO TABS: 5.0000 mg | ORAL_TABLET | Freq: Every day | ORAL | 0 refills | Status: DC

## 2015-12-23 NOTE — Telephone Encounter (Signed)
A prescription for Glipizide has been sent to [facility] [address] [address] per Dr. Manuella Ghazi, patient has been notified and verbalized understanding

## 2015-12-29 ENCOUNTER — Telehealth: Payer: Self-pay | Admitting: Family Medicine

## 2015-12-29 DIAGNOSIS — E119 Type 2 diabetes mellitus without complications: Secondary | ICD-10-CM

## 2015-12-29 MED ORDER — METFORMIN HCL ER 500 MG PO TB24: 1000.0000 mg | ORAL_TABLET | Freq: Two times a day (BID) | ORAL | 1 refills | Status: DC

## 2015-12-29 MED ORDER — GLUCOSE BLOOD VI STRP: ORAL_STRIP | 12 refills | Status: DC

## 2015-12-29 NOTE — Telephone Encounter (Signed)
Patient states that [facility] never did receive his prescriptions for Metformin and Accu Check Aviva. He is now out and would like to know if you would just send a 90 day supply for both prescriptions to [facility].

## 2015-12-29 NOTE — Telephone Encounter (Signed)
Prescription for metformin and Accu-Chek test strips has been sent to [facility]

## 2015-12-30 NOTE — Telephone Encounter (Signed)
Patient verbally informed. Thank you

## 2016-01-04 ENCOUNTER — Telehealth: Payer: Self-pay | Admitting: Family Medicine

## 2016-01-04 DIAGNOSIS — M170 Bilateral primary osteoarthritis of knee: Secondary | ICD-10-CM

## 2016-01-04 MED ORDER — HYDROCODONE-ACETAMINOPHEN 10-325 MG PO TABS: 1.0000 | ORAL_TABLET | Freq: Three times a day (TID) | ORAL | 0 refills | Status: DC | PRN

## 2016-01-04 NOTE — Telephone Encounter (Signed)
Pt needs refill on Hydrocodone Please advise 

## 2016-01-04 NOTE — Telephone Encounter (Signed)
Prescription for hydrocodone is ready for pickup.

## 2016-01-05 NOTE — Telephone Encounter (Signed)
Pt informed

## 2016-01-14 ENCOUNTER — Telehealth: Payer: Self-pay | Admitting: Family Medicine

## 2016-01-14 DIAGNOSIS — F419 Anxiety disorder, unspecified: Secondary | ICD-10-CM

## 2016-01-14 MED ORDER — CLONAZEPAM 1 MG PO TABS: 1.0000 mg | ORAL_TABLET | Freq: Two times a day (BID) | ORAL | 0 refills | Status: DC | PRN

## 2016-01-14 NOTE — Telephone Encounter (Signed)
Prescription for clonazepam is printed and ready to be faxed over to mail-order pharmacy

## 2016-01-14 NOTE — Telephone Encounter (Signed)
Pt needs Clonazepam 1 mg to be sent to [facility]. Pt states he is out

## 2016-01-14 NOTE — Telephone Encounter (Signed)
RX faxed and confirmation came through

## 2016-01-28 ENCOUNTER — Ambulatory Visit: Payer: Self-pay | Admitting: Orthopedic Surgery

## 2016-01-28 ENCOUNTER — Telehealth: Payer: Self-pay | Admitting: Family Medicine

## 2016-01-28 DIAGNOSIS — M170 Bilateral primary osteoarthritis of knee: Secondary | ICD-10-CM

## 2016-01-28 NOTE — Telephone Encounter (Signed)
Pt called to get refill on his Hydrocodone. Pt is due 02/04/2016.

## 2016-02-01 MED ORDER — HYDROCODONE-ACETAMINOPHEN 10-325 MG PO TABS: 1.0000 | ORAL_TABLET | Freq: Three times a day (TID) | ORAL | 0 refills | Status: DC | PRN

## 2016-02-01 NOTE — Telephone Encounter (Signed)
Prescription for hydrocodone is printed and ready for pickup 

## 2016-03-09 ENCOUNTER — Ambulatory Visit
Admission: RE | Admit: 2016-03-09 | Discharge: 2016-03-09 | Disposition: A | Discharge status: Home / Self Care | Payer: Medicare PPO | Source: Ambulatory Visit | Attending: Family Medicine | Admitting: Family Medicine

## 2016-03-09 ENCOUNTER — Ambulatory Visit (INDEPENDENT_AMBULATORY_CARE_PROVIDER_SITE_OTHER): Payer: Medicare PPO | Admitting: Family Medicine

## 2016-03-09 ENCOUNTER — Encounter: Payer: Self-pay | Admitting: Family Medicine

## 2016-03-09 ENCOUNTER — Other Ambulatory Visit: Payer: Self-pay | Admitting: Family Medicine

## 2016-03-09 VITALS — BP 127/78 | HR 90 | Temp 98.4°F | Resp 17 | Ht 74.0 in | Wt 261.7 lb

## 2016-03-09 DIAGNOSIS — Z01818 Encounter for other preprocedural examination: Secondary | ICD-10-CM | POA: Insufficient documentation

## 2016-03-09 DIAGNOSIS — I10 Essential (primary) hypertension: Secondary | ICD-10-CM

## 2016-03-09 DIAGNOSIS — F419 Anxiety disorder, unspecified: Secondary | ICD-10-CM | POA: Diagnosis not present

## 2016-03-09 DIAGNOSIS — E119 Type 2 diabetes mellitus without complications: Secondary | ICD-10-CM

## 2016-03-09 DIAGNOSIS — J302 Other seasonal allergic rhinitis: Secondary | ICD-10-CM | POA: Diagnosis not present

## 2016-03-09 DIAGNOSIS — N400 Enlarged prostate without lower urinary tract symptoms: Secondary | ICD-10-CM

## 2016-03-09 DIAGNOSIS — M170 Bilateral primary osteoarthritis of knee: Secondary | ICD-10-CM

## 2016-03-09 DIAGNOSIS — Z8679 Personal history of other diseases of the circulatory system: Secondary | ICD-10-CM | POA: Diagnosis not present

## 2016-03-09 LAB — GLUCOSE, POCT (MANUAL RESULT ENTRY): POC GLUCOSE: 174 mg/dL — AB (ref 70–99)

## 2016-03-09 MED ORDER — METFORMIN HCL ER 500 MG PO TB24: 1000.0000 mg | ORAL_TABLET | Freq: Two times a day (BID) | ORAL | 1 refills | Status: DC

## 2016-03-09 MED ORDER — CLONAZEPAM 1 MG PO TABS: 1.0000 mg | ORAL_TABLET | Freq: Two times a day (BID) | ORAL | 0 refills | Status: DC | PRN

## 2016-03-09 MED ORDER — FLUTICASONE PROPIONATE 50 MCG/ACT NA SUSP: 2.0000 | Freq: Every day | NASAL | 0 refills | Status: DC

## 2016-03-09 MED ORDER — GLIPIZIDE 5 MG PO TABS: 5.0000 mg | ORAL_TABLET | Freq: Every day | ORAL | 0 refills | Status: DC

## 2016-03-09 MED ORDER — TAMSULOSIN HCL 0.4 MG PO CAPS: 0.4000 mg | ORAL_CAPSULE | Freq: Every day | ORAL | 0 refills | Status: DC

## 2016-03-09 MED ORDER — HYDROCODONE-ACETAMINOPHEN 10-325 MG PO TABS: 1.0000 | ORAL_TABLET | Freq: Three times a day (TID) | ORAL | 0 refills | Status: DC | PRN

## 2016-03-09 MED ORDER — METOPROLOL SUCCINATE ER 100 MG PO TB24: 100.0000 mg | ORAL_TABLET | ORAL | 0 refills | Status: DC

## 2016-03-09 MED ORDER — LOSARTAN POTASSIUM 100 MG PO TABS: 100.0000 mg | ORAL_TABLET | Freq: Every day | ORAL | 0 refills | Status: DC

## 2016-03-09 NOTE — Progress Notes (Signed)
Name: Evan Rivas   MRN: [id number]    DOB: May 26, 1950   Date:03/09/2016       Progress Note  Subjective  Chief Complaint  Chief Complaint  Patient presents with  . Follow-up    3 mo / surgicial clearance   . Medication Refill    Diabetes  He presents for his follow-up diabetic visit. He has type 2 diabetes mellitus. His disease course has been stable. There are no hypoglycemic associated symptoms. Pertinent negatives for hypoglycemia include no headaches. Associated symptoms include fatigue. Pertinent negatives for diabetes include no blurred vision, no chest pain, no polydipsia and no polyuria. Pertinent negatives for diabetic complications include no CVA, heart disease or peripheral neuropathy. Current diabetic treatment includes oral agent (triple therapy). His weight is stable. He is following a diabetic diet. He participates in exercise daily. His breakfast blood glucose range is generally 130-140 mg/dl. An ACE inhibitor/angiotensin II receptor blocker is being taken.  Hypertension  This is a chronic problem. The problem is unchanged. The problem is controlled. Pertinent negatives include no blurred vision, chest pain, headaches, palpitations or shortness of breath. Past treatments include calcium channel blockers, angiotensin blockers and beta blockers. There is no history of CVA.    Surgical Clearance for left Total Knee Replacement: Pt. Is here for surgical clearance for left total knee replacement, due to be performed in January 2018. He has had multiple previous surgeries and procedures as listed in history. Pt. has never had any anesthesia complications (except woke up in the middle of surgery for left knee arthroscopy in 1995) or excess bleeding, blood clots or infections.  No history of heart attack or stroke, Diabetes is reasonably well controlled.      Past Medical History:  Diagnosis Date  . Anxiety    panic attacks  . Arthritis   . Cancer (HCC)    lip  .  Depression   . Diabetes mellitus without complication ([place])   . Gout   . Hyperlipidemia   . Hypertension     Past Surgical History:  Procedure Laterality Date  . ARTHROSCOPIC REPAIR ACL Left   . CYSTOSCOPY W/ URETERAL STENT PLACEMENT Right 09/07/2014   Procedure: CYSTOSCOPY WITH STENT REPLACEMENT;  Surgeon: Hollice Espy, MD;  Location: [facility] ORS;  Service: Urology;  Laterality: Right;  . CYSTOSCOPY/URETEROSCOPY/HOLMIUM LASER/STENT PLACEMENT Right 08/17/2014   Procedure: CYSTOSCOPY/URETEROSCOPY//STENT PLACEMENT/ attempt of lithotripsy;  Surgeon: Hollice Espy, MD;  Location: [facility] OR[facility];  Service: Urology;  Laterality: Right;  . HERNIA REPAIR    . MOHS SURGERY     lip  . URETEROSCOPY WITH HOLMIUM LASER LITHOTRIPSY Right 09/07/2014   Procedure: URETEROSCOPY WITH HOLMIUM LASER LITHOTRIPSY;  Surgeon: Hollice Espy, MD;  Location: [facility];  Service: Urology;  Laterality: Right;    Family History  Problem Relation Age of Onset  . Hypertension Mother   . Cancer Mother   . Stroke Father   . Depression Sister     Social History   Social History  . Marital status: Married    Spouse name: N/A  . Number of children: N/A  . Years of education: N/A   Occupational History  . Not on file.   Social History Main Topics  . Smoking status: Never Smoker  . Smokeless tobacco: Never Used  . Alcohol use No  . Drug use: No  . Sexual activity: Yes   Other Topics Concern  . Not on file   Social History Narrative  . No narrative on file  Current Outpatient Prescriptions:  .  allopurinol (ZYLOPRIM) 300 MG tablet, Take 1 tablet (300 mg total) by mouth daily., Disp: 90 tablet, Rfl: 1 .  amLODipine (NORVASC) 10 MG tablet, Take 1 tablet (10 mg total) by mouth daily., Disp: 90 tablet, Rfl: 1 .  aspirin EC 81 MG tablet, Take 81 mg by mouth daily., Disp: , Rfl:  .  atorvastatin (LIPITOR) 40 MG tablet, Take 1 tablet (40 mg total) by mouth at bedtime. At Bedtime, Disp: 90 tablet, Rfl: 1 .   clonazePAM (KLONOPIN) 1 MG tablet, Take 1 tablet (1 mg total) by mouth 2 (two) times daily as needed., Disp: 180 tablet, Rfl: 0 .  FLUoxetine (PROZAC) 40 MG capsule, Take 1 capsule (40 mg total) by mouth every morning., Disp: 90 capsule, Rfl: 1 .  fluticasone (FLONASE) 50 MCG/ACT nasal spray, Place 2 sprays into both nostrils daily. 2 sprays, Disp: 16 g, Rfl: 0 .  glipiZIDE (GLUCOTROL) 5 MG tablet, Take 1 tablet (5 mg total) by mouth daily., Disp: 90 tablet, Rfl: 0 .  glucose blood (ACCU-CHEK AVIVA) test strip, Use as instructed, Disp: 100 each, Rfl: 12 .  HYDROcodone-acetaminophen (NORCO) 10-325 MG tablet, Take 1 tablet by mouth 3 (three) times daily as needed for moderate pain. 10-325, Disp: 90 tablet, Rfl: 0 .  losartan (COZAAR) 100 MG tablet, Take 1 tablet (100 mg total) by mouth daily., Disp: 90 tablet, Rfl: 0 .  metFORMIN (GLUCOPHAGE-XR) 500 MG 24 hr tablet, Take 2 tablets (1,000 mg total) by mouth 2 (two) times daily. 1000mg  BID, Disp: 360 tablet, Rfl: 1 .  metoprolol succinate (TOPROL-XL) 100 MG 24 hr tablet, Take 1 tablet (100 mg total) by mouth every morning., Disp: 90 tablet, Rfl: 0 .  metoprolol succinate (TOPROL-XL) 25 MG 24 hr tablet, Take 1 tablet (25 mg total) by mouth every morning., Disp: 90 tablet, Rfl: 1 .  MULTIPLE VITAMIN PO, Take by mouth daily., Disp: , Rfl:  .  nabumetone (RELAFEN) 500 MG tablet, Take 1 tablet (500 mg total) by mouth 2 (two) times daily., Disp: 180 tablet, Rfl: 1 .  saxagliptin HCl (ONGLYZA) 5 MG TABS tablet, Take 1 tablet (5 mg total) by mouth daily., Disp: 90 tablet, Rfl: 0 .  sitaGLIPtin (JANUVIA) 100 MG tablet, Take 1 tablet (100 mg total) by mouth daily., Disp: 90 tablet, Rfl: 0 .  tamsulosin (FLOMAX) 0.4 MG CAPS capsule, Take 1 capsule (0.4 mg total) by mouth daily after supper., Disp: 90 capsule, Rfl: 0 .  tiZANidine (ZANAFLEX) 4 MG tablet, Take 1 tablet (4 mg total) by mouth as needed., Disp: 90 tablet, Rfl: 2  Allergies  Allergen Reactions  .  2,4-D Dimethylamine (Amisol) Nausea And Vomiting  . Celebrex  [Celecoxib]     GI  . Tetanus Toxoids Other (See Comments)    unkown  . Tetanus-Diphtheria Toxoids Td     Other reaction(s): Unknown     Review of Systems  Constitutional: Positive for fatigue.  Eyes: Negative for blurred vision.  Respiratory: Negative for shortness of breath.   Cardiovascular: Negative for chest pain and palpitations.  Neurological: Negative for headaches.  Endo/Heme/Allergies: Negative for polydipsia.      Objective  Vitals:   03/09/16 0929  BP: 127/78  Pulse: 90  Resp: 17  Temp: 98.4 F (36.9 C)  TempSrc: Oral  SpO2: 97%  Weight: 261 lb 11.2 oz (118.7 kg)  Height: 6\' 2"  (1.88 m)    Physical Exam  Constitutional: He is oriented to person, place, and  time and well-developed, well-nourished, and in no distress.  HENT:  Head: Normocephalic and atraumatic.  Cardiovascular: Normal rate, regular rhythm and normal heart sounds.   No murmur heard. Pulmonary/Chest: Effort normal and breath sounds normal. He has no wheezes.  Abdominal: Soft. Bowel sounds are normal. There is no tenderness.  Musculoskeletal:       Right knee: He exhibits swelling. No tenderness found.       Left knee: He exhibits swelling. No tenderness found.       Right ankle: He exhibits swelling.       Left ankle: He exhibits swelling.  2+ pitting edema bilateral ankles.  Neurological: He is alert and oriented to person, place, and time.  Psychiatric: Mood, memory, affect and judgment normal.  Nursing note and vitals reviewed.     Assessment & Plan  1. Anxiety Stable, continue on clonazepam, refills provided - clonazePAM (KLONOPIN) 1 MG tablet; Take 1 tablet (1 mg total) by mouth 2 (two) times daily as needed.  Dispense: 180 tablet; Refill: 0  2. Essential hypertension BP stable and controlled on present antihypertensive therapy - losartan (COZAAR) 100 MG tablet; Take 1 tablet (100 mg total) by mouth daily.   Dispense: 90 tablet; Refill: 0 - metoprolol succinate (TOPROL-XL) 100 MG 24 hr tablet; Take 1 tablet (100 mg total) by mouth every morning.  Dispense: 90 tablet; Refill: 0  3. Other seasonal allergic rhinitis  - fluticasone (FLONASE) 50 MCG/ACT nasal spray; Place 2 sprays into both nostrils daily. 2 sprays  Dispense: 16 g; Refill: 0  4. Primary osteoarthritis of both knees Stable, pain responsive to opioid therapy, compliant with controlled substances agreement and understands the dependence potential, side effects and drug interactions of opioids. Refills provided - HYDROcodone-acetaminophen (NORCO) 10-325 MG tablet; Take 1 tablet by mouth 3 (three) times daily as needed for moderate pain. 10-325  Dispense: 90 tablet; Refill: 0  5. Type 2 diabetes mellitus without complication, without long-term current use of insulin (HCC) Obtain hemoglobin A1c, continue on metformin and glipizide as prescribed - metFORMIN (GLUCOPHAGE-XR) 500 MG 24 hr tablet; Take 2 tablets (1,000 mg total) by mouth 2 (two) times daily. 1000mg  BID  Dispense: 360 tablet; Refill: 1 - glipiZIDE (GLUCOTROL) 5 MG tablet; Take 1 tablet (5 mg total) by mouth daily.  Dispense: 90 tablet; Refill: 0 - HgB A1c  6. BPH without urinary obstruction  - tamsulosin (FLOMAX) 0.4 MG CAPS capsule; Take 1 capsule (0.4 mg total) by mouth daily after supper.  Dispense: 90 capsule; Refill: 0  7. Preoperative clearance EKG is normal sinus rhythm, chest x-ray shows no acute cardiopulmonary abnormality. Obtain baseline lab work, complete preoperative clearance at work after lab results are available. He will discuss with orthopedics regarding discontinuing aspirin 81 mg 7 days before the surgery.  - EKG 12-Lead - DG Chest 2 View; Future - CBC with Differential - COMPLETE METABOLIC PANEL WITH GFR  Note: Total face-to-face time 40 minutes, greater than 50% was spent in counseling and condition of care with the patient. This included addressing  multiple medical conditions, reviewing medications, discussing previous surgical history, obtaining and reviewing EKG and making pertinent recommendations.  Cherilyn Sautter [facility] 03/09/2016 9:40 AM

## 2016-03-10 LAB — CBC WITH DIFFERENTIAL/PLATELET
BASOS: 0 %
Basophils Absolute: 0 10*3/uL (ref 0.0–0.2)
EOS (ABSOLUTE): 0.2 10*3/uL (ref 0.0–0.4)
EOS: 3 %
HEMATOCRIT: 43.7 % (ref 37.5–51.0)
HEMOGLOBIN: 15.2 g/dL (ref 13.0–17.7)
IMMATURE GRANS (ABS): 0 10*3/uL (ref 0.0–0.1)
Immature Granulocytes: 0 %
LYMPHS: 23 %
Lymphocytes Absolute: 1.6 10*3/uL (ref 0.7–3.1)
MCH: 32.4 pg (ref 26.6–33.0)
MCHC: 34.8 g/dL (ref 31.5–35.7)
MCV: 93 fL (ref 79–97)
MONOCYTES: 4 %
Monocytes Absolute: 0.3 10*3/uL (ref 0.1–0.9)
NEUTROS ABS: 5 10*3/uL (ref 1.4–7.0)
Neutrophils: 70 %
Platelets: 227 10*3/uL (ref 150–379)
RBC: 4.69 x10E6/uL (ref 4.14–5.80)
RDW: 14.3 % (ref 12.3–15.4)
WBC: 7.2 10*3/uL (ref 3.4–10.8)

## 2016-03-10 LAB — COMPREHENSIVE METABOLIC PANEL
A/G RATIO: 1.7 (ref 1.2–2.2)
ALBUMIN: 4.4 g/dL (ref 3.6–4.8)
ALT: 21 IU/L (ref 0–44)
AST: 16 IU/L (ref 0–40)
Alkaline Phosphatase: 92 IU/L (ref 39–117)
BILIRUBIN TOTAL: 0.4 mg/dL (ref 0.0–1.2)
BUN / CREAT RATIO: 12 (ref 10–24)
BUN: 12 mg/dL (ref 8–27)
CHLORIDE: 103 mmol/L (ref 96–106)
CO2: 26 mmol/L (ref 18–29)
Calcium: 9.2 mg/dL (ref 8.6–10.2)
Creatinine, Ser: 0.99 mg/dL (ref 0.76–1.27)
GFR calc non Af Amer: 80 mL/min/{1.73_m2} (ref >59)
GFR, EST AFRICAN AMERICAN: 92 mL/min/{1.73_m2} (ref >59)
GLUCOSE: 167 mg/dL — AB (ref 65–99)
Globulin, Total: 2.6 g/dL (ref 1.5–4.5)
Potassium: 3.6 mmol/L (ref 3.5–5.2)
Sodium: 146 mmol/L — ABNORMAL HIGH (ref 134–144)
TOTAL PROTEIN: 7 g/dL (ref 6.0–8.5)

## 2016-03-10 LAB — HGB A1C W/O EAG: Hgb A1c MFr Bld: 6.6 % — ABNORMAL HIGH (ref 4.8–5.6)

## 2016-04-04 ENCOUNTER — Telehealth: Payer: Self-pay | Admitting: Family Medicine

## 2016-04-04 DIAGNOSIS — M170 Bilateral primary osteoarthritis of knee: Secondary | ICD-10-CM

## 2016-04-04 NOTE — Telephone Encounter (Signed)
Pt needs refill on Hydrocodone °

## 2016-04-06 MED ORDER — HYDROCODONE-ACETAMINOPHEN 10-325 MG PO TABS: 1.0000 | ORAL_TABLET | Freq: Three times a day (TID) | ORAL | 0 refills | Status: DC | PRN

## 2016-04-06 NOTE — Telephone Encounter (Signed)
Prescription for hydrocodone is ready for pickup.

## 2016-04-07 NOTE — Telephone Encounter (Signed)
Pt informed

## 2016-04-14 ENCOUNTER — Encounter (HOSPITAL_COMMUNITY): Payer: Self-pay

## 2016-04-14 ENCOUNTER — Encounter (HOSPITAL_COMMUNITY)
Admission: RE | Admit: 2016-04-14 | Discharge: 2016-04-14 | Disposition: A | Discharge status: Home / Self Care | Payer: Medicare PPO | Source: Ambulatory Visit | Attending: Orthopedic Surgery | Admitting: Orthopedic Surgery

## 2016-04-14 DIAGNOSIS — Z01818 Encounter for other preprocedural examination: Secondary | ICD-10-CM | POA: Insufficient documentation

## 2016-04-14 HISTORY — DX: Personal history of urinary calculi: Z87.442

## 2016-04-14 HISTORY — DX: Pneumonia, unspecified organism: J18.9

## 2016-04-14 LAB — PROTIME-INR
INR: 0.94
Prothrombin Time: 12.6 seconds (ref 11.4–15.2)

## 2016-04-14 LAB — COMPREHENSIVE METABOLIC PANEL
ALT: 21 U/L (ref 17–63)
AST: 19 U/L (ref 15–41)
Albumin: 4.1 g/dL (ref 3.5–5.0)
Alkaline Phosphatase: 79 U/L (ref 38–126)
Anion gap: 8 (ref 5–15)
BILIRUBIN TOTAL: 0.7 mg/dL (ref 0.3–1.2)
BUN: 16 mg/dL (ref 6–20)
CHLORIDE: 104 mmol/L (ref 101–111)
CO2: 28 mmol/L (ref 22–32)
CREATININE: 0.92 mg/dL (ref 0.61–1.24)
Calcium: 9.2 mg/dL (ref 8.9–10.3)
GFR calc Af Amer: >60 mL/min (ref >60)
GLUCOSE: 192 mg/dL — AB (ref 65–99)
Potassium: 3.5 mmol/L (ref 3.5–5.1)
Sodium: 140 mmol/L (ref 135–145)
Total Protein: 6.8 g/dL (ref 6.5–8.1)

## 2016-04-14 LAB — CBC
HEMATOCRIT: 43.2 % (ref 39.0–52.0)
Hemoglobin: 15 g/dL (ref 13.0–17.0)
MCH: 31.7 pg (ref 26.0–34.0)
MCHC: 34.7 g/dL (ref 30.0–36.0)
MCV: 91.3 fL (ref 78.0–100.0)
Platelets: 207 10*3/uL (ref 150–400)
RBC: 4.73 MIL/uL (ref 4.22–5.81)
RDW: 13.3 % (ref 11.5–15.5)
WBC: 7.5 10*3/uL (ref 4.0–10.5)

## 2016-04-14 LAB — ABO/RH: ABO/RH(D): A POS

## 2016-04-14 LAB — URINALYSIS, ROUTINE W REFLEX MICROSCOPIC
BILIRUBIN URINE: NEGATIVE
Bacteria, UA: NONE SEEN
Glucose, UA: >=500 mg/dL — AB
HGB URINE DIPSTICK: NEGATIVE
Ketones, ur: NEGATIVE mg/dL
LEUKOCYTES UA: NEGATIVE
Nitrite: NEGATIVE
PH: 6 (ref 5.0–8.0)
Protein, ur: NEGATIVE mg/dL
SPECIFIC GRAVITY, URINE: 1.017 (ref 1.005–1.030)

## 2016-04-14 LAB — SURGICAL PCR SCREEN
MRSA, PCR: NEGATIVE
STAPHYLOCOCCUS AUREUS: POSITIVE — AB

## 2016-04-14 LAB — GLUCOSE, CAPILLARY: Glucose-Capillary: 239 mg/dL — ABNORMAL HIGH (ref 65–99)

## 2016-04-14 LAB — APTT: aPTT: 26 seconds (ref 24–36)

## 2016-04-14 NOTE — Patient Instructions (Addendum)
Evan Rivas  04/14/2016   Your procedure is scheduled on: 04/24/16  Report to [facility] Main  Entrance take [facility]  elevators to 3rd floor to  [place] at 8:35 AM.  Call this number if you have problems the morning of surgery [phone]   Remember: ONLY 1 PERSON MAY GO WITH YOU TO SHORT STAY TO GET  READY MORNING OF [place].    Have a light, healthy snack before bed the night before surgery.  Do not eat food or drink liquids :After Midnight.     Take these medicines the morning of surgery with A SIP OF WATER: Amlodipine, Fluoxetine, Metoprolol succinate, Flonase if needed, Tizanidine if needed, hydrocodone-acetaminophen if needed.  How to Manage Your Diabetes Before and After Surgery  Why is it important to control my blood sugar before and after surgery? . Improving blood sugar levels before and after surgery helps healing and can limit problems. . A way of improving blood sugar control is eating a healthy diet by: o  Eating less sugar and carbohydrates o  Increasing activity/exercise o  Talking with your doctor about reaching your blood sugar goals . High blood sugars (greater than 180 mg/dL) can raise your risk of infections and slow your recovery, so you will need to focus on controlling your diabetes during the weeks before surgery. . Make sure that the doctor who takes care of your diabetes knows about your planned surgery including the date and location.  How do I manage my blood sugar before surgery? . Check your blood sugar at least 4 times a day, starting 2 days before surgery, to make sure that the level is not too high or low. o Check your blood sugar the morning of your surgery when you wake up and every 2 hours until you get to the Short Stay unit. . If your blood sugar is less than 70 mg/dL, you will need to treat for low blood sugar: o Do not take insulin. o Treat a low blood sugar (less than 70 mg/dL) with  cup of clear  juice (cranberry or apple), 4 glucose tablets, OR glucose gel. o Recheck blood sugar in 15 minutes after treatment (to make sure it is greater than 70 mg/dL). If your blood sugar is not greater than 70 mg/dL on recheck, call [phone] for further instructions. . Report your blood sugar to the short stay nurse when you get to Short Stay.  . If you are admitted to the hospital after surgery: o Your blood sugar will be checked by the staff and you will probably be given insulin after surgery (instead of oral diabetes medicines) to make sure you have good blood sugar levels. o The goal for blood sugar control after surgery is 80-180 mg/dL.   WHAT DO I DO ABOUT MY DIABETES MEDICATION?  [name] The day before surgery: o Take Metformin as usual o Take only morning and/or lunch dose of Glipizide  . Do not take oral diabetes medicines (pills) the morning of surgery.     Patient Signature:  Date:   Nurse Signature:  Date:   Reviewed and Endorsed by [facility] Patient Education Committee, August 2015                               You may not have any metal on your body including  hair pins and              piercings  Do not wear jewelry, make-up, lotions, powders or perfumes, deodorant             Do not wear nail polish.  Do not shave  48 hours prior to surgery.              Men may shave face and neck.   Do not bring valuables to the hospital. [place].  Contacts, dentures or bridgework may not be worn into surgery.  Leave suitcase in the car. After surgery it may be brought to your room.               Please read over the following fact sheets you were given: _____________________________________________________________________             [facility] - Preparing for Surgery Before surgery, you can play an important role.  Because skin is not sterile, your skin needs to be as free of germs as possible.  You can reduce the number of  germs on your skin by washing with CHG (chlorahexidine gluconate) soap before surgery.  CHG is an antiseptic cleaner which kills germs and bonds with the skin to continue killing germs even after washing. Please DO NOT use if you have an allergy to CHG or antibacterial soaps.  If your skin becomes reddened/irritated stop using the CHG and inform your nurse when you arrive at Short Stay. Do not shave (including legs and underarms) for at least 48 hours prior to the first CHG shower.  You may shave your face/neck. Please follow these instructions carefully:  1.  Shower with CHG Soap the night before surgery and the  morning of Surgery.  2.  If you choose to wash your hair, wash your hair first as usual with your  normal  shampoo.  3.  After you shampoo, rinse your hair and body thoroughly to remove the  shampoo.                           4.  Use CHG as you would any other liquid soap.  You can apply chg directly  to the skin and wash                       Gently with a scrungie or clean washcloth.  5.  Apply the CHG Soap to your body ONLY FROM THE NECK DOWN.   Do not use on face/ open                           Wound or open sores. Avoid contact with eyes, ears mouth and genitals (private parts).                       Wash face,  Genitals (private parts) with your normal soap.             6.  Wash thoroughly, paying special attention to the area where your surgery  will be performed.  7.  Thoroughly rinse your body with warm water from the neck down.  8.  DO NOT shower/wash with your normal soap after using and rinsing off  the CHG Soap.  9.  Pat yourself dry with a clean towel.            10.  Wear clean pajamas.            11.  Place clean sheets on your bed the night of your first shower and do not  sleep with pets. Day of Surgery : Do not apply any lotions/deodorants the morning of surgery.  Please wear clean clothes to the hospital/surgery center.  FAILURE TO FOLLOW THESE  INSTRUCTIONS MAY RESULT IN THE CANCELLATION OF YOUR SURGERY PATIENT SIGNATURE_________________________________  NURSE SIGNATURE__________________________________  ________________________________________________________________________   Adam Phenix  An incentive spirometer is a tool that can help keep your lungs clear and active. This tool measures how well you are filling your lungs with each breath. Taking long deep breaths may help reverse or decrease the chance of developing breathing (pulmonary) problems (especially infection) following:  A long period of time when you are unable to move or be active. BEFORE THE PROCEDURE   If the spirometer includes an indicator to show your best effort, your nurse or respiratory therapist will set it to a desired goal.  If possible, sit up straight or lean slightly forward. Try not to slouch.  Hold the incentive spirometer in an upright position. INSTRUCTIONS FOR USE  1. Sit on the edge of your bed if possible, or sit up as far as you can in bed or on a chair. 2. Hold the incentive spirometer in an upright position. 3. Breathe out normally. 4. Place the mouthpiece in your mouth and seal your lips tightly around it. 5. Breathe in slowly and as deeply as possible, raising the piston or the ball toward the top of the column. 6. Hold your breath for 3-5 seconds or for as long as possible. Allow the piston or ball to fall to the bottom of the column. 7. Remove the mouthpiece from your mouth and breathe out normally. 8. Rest for a few seconds and repeat Steps 1 through 7 at least 10 times every 1-2 hours when you are awake. Take your time and take a few normal breaths between deep breaths. 9. The spirometer may include an indicator to show your best effort. Use the indicator as a goal to work toward during each repetition. 10. After each set of 10 deep breaths, practice coughing to be sure your lungs are clear. If you have an incision (the  cut made at the time of surgery), support your incision when coughing by placing a pillow or rolled up towels firmly against it. Once you are able to get out of bed, walk around indoors and cough well. You may stop using the incentive spirometer when instructed by your caregiver.  RISKS AND COMPLICATIONS  Take your time so you do not get dizzy or light-headed.  If you are in pain, you may need to take or ask for pain medication before doing incentive spirometry. It is harder to take a deep breath if you are having pain. AFTER USE  Rest and breathe slowly and easily.  It can be helpful to keep track of a log of your progress. Your caregiver can provide you with a simple table to help with this. If you are using the spirometer at home, follow these instructions: [place] IF:   You are having difficultly using the spirometer.  You have trouble using the spirometer as often as instructed.  Your pain medication is not giving enough relief while using the spirometer.  You  develop fever of 100.5 F (38.1 C) or higher. SEEK IMMEDIATE MEDICAL CARE IF:   You cough up bloody sputum that had not been present before.  You develop fever of 102 F (38.9 C) or greater.  You develop worsening pain at or near the incision site. MAKE SURE YOU:   Understand these instructions.  Will watch your condition.  Will get help right away if you are not doing well or get worse. Document Released: 07/31/2006 Document Revised: 06/12/2011 Document Reviewed: 10/01/2006 Exit[employer] Patient Information 2014 [employer], [place].   ________________________________________________________________________  WHAT IS A BLOOD TRANSFUSION? Blood Transfusion Information  A transfusion is the replacement of blood or some of its parts. Blood is made up of multiple cells which provide different functions.  Red blood cells carry oxygen and are used for blood loss replacement.  White blood cells fight against  infection.  Platelets control bleeding.  Plasma helps clot blood.  Other blood products are available for specialized needs, such as hemophilia or other clotting disorders. BEFORE THE TRANSFUSION  Who gives blood for transfusions?   Healthy volunteers who are fully evaluated to make sure their blood is safe. This is blood bank blood. Transfusion therapy is the safest it has ever been in the practice of medicine. Before blood is taken from a donor, a complete history is taken to make sure that person has no history of diseases nor engages in risky social behavior (examples are intravenous drug use or sexual activity with multiple partners). The donor's travel history is screened to minimize risk of transmitting infections, such as malaria. The donated blood is tested for signs of infectious diseases, such as HIV and hepatitis. The blood is then tested to be sure it is compatible with you in order to minimize the chance of a transfusion reaction. If you or a relative donates blood, this is often done in anticipation of surgery and is not appropriate for emergency situations. It takes many days to process the donated blood. RISKS AND COMPLICATIONS Although transfusion therapy is very safe and saves many lives, the main dangers of transfusion include:   Getting an infectious disease.  Developing a transfusion reaction. This is an allergic reaction to something in the blood you were given. Every precaution is taken to prevent this. The decision to have a blood transfusion has been considered carefully by your caregiver before blood is given. Blood is not given unless the benefits outweigh the risks. AFTER THE TRANSFUSION  Right after receiving a blood transfusion, you will usually feel much better and more energetic. This is especially true if your red blood cells have gotten low (anemic). The transfusion raises the level of the red blood cells which carry oxygen, and this usually causes an energy  increase.  The nurse administering the transfusion will monitor you carefully for complications. HOME CARE INSTRUCTIONS  No special instructions are needed after a transfusion. You may find your energy is better. Speak with your caregiver about any limitations on activity for underlying diseases you may have. SEEK MEDICAL CARE IF:   Your condition is not improving after your transfusion.  You develop redness or irritation at the intravenous (IV) site. SEEK IMMEDIATE MEDICAL CARE IF:  Any of the following symptoms occur over the next 12 hours:  Shaking chills.  You have a temperature by mouth above 102 F (38.9 C), not controlled by medicine.  Chest, back, or muscle pain.  People around you feel you are not acting correctly or are confused.  Shortness of  breath or difficulty breathing.  Dizziness and fainting.  You get a rash or develop hives.  You have a decrease in urine output.  Your urine turns a dark color or changes to pink, red, or brown. Any of the following symptoms occur over the next 10 days:  You have a temperature by mouth above 102 F (38.9 C), not controlled by medicine.  Shortness of breath.  Weakness after normal activity.  The white part of the eye turns yellow (jaundice).  You have a decrease in the amount of urine or are urinating less often.  Your urine turns a dark color or changes to pink, red, or brown. Document Released: 03/17/2000 Document Revised: 06/12/2011 Document Reviewed: 11/04/2007 [facility] Patient Information 2014 [place], [place].  _______________________________________________________________________

## 2016-04-14 NOTE — Progress Notes (Signed)
   04/14/16 1110  OBSTRUCTIVE SLEEP APNEA  Have you ever been diagnosed with sleep apnea through a sleep study? No  Do you snore loudly (loud enough to be heard through closed doors)?  1  Do you often feel tired, fatigued, or sleepy during the daytime (such as falling asleep during driving or talking to someone)? 1  Has anyone observed you stop breathing during your sleep? 0  Do you have, or are you being treated for high blood pressure? 1  BMI more than 35 kg/m2? 0  Age > 81 (1-yes) 1  Male Gender (Yes=1) 1  Obstructive Sleep Apnea Score 5

## 2016-04-14 NOTE — Pre-Procedure Instructions (Addendum)
03/09/16 on chart: Medical clearance Dr. Manuella Ghazi, EKG, CXR, LOV Dr. Manuella Ghazi  03/09/16 HgbA1C epic  Pre-op PCR returned positive for staph. Called in prescription for pt. Spoke with pt on phone. He understands and will follow instructions. Routed results to Dr. Wynelle Link.

## 2016-04-20 ENCOUNTER — Ambulatory Visit: Payer: Self-pay | Admitting: Orthopedic Surgery

## 2016-04-20 NOTE — H&P (Signed)
Evan Rivas DOB: 03-24-1951 Married / Language: [name] / Race: White Male Date of Admission:  04/24/2016 CC:  Left knee pain History of Present Illness The patient is a 66 year old male who comes in  for a preoperative History and Physical. The patient is scheduled for a left total knee arthroplasty to be performed by Dr. Dione Plover. Aluisio, MD at [facility] on 04-24-2016. The patient is a 66 year old male who presented for follow up of their knee. The patient is being followed for their bilateral osteoarthritis. They are now months out from bilateral cortisone injections. The patient feels that they feel their knees have improved only temporarily following cortisone. Current treatment includes: NSAIDs. He has had significant problems with both knees for quite a while now. He has documented bone on bone arthritis in the medial and patellofemoral compartments of each knee. He said the knees are preventing him from doing things he desires. He had cortisone injections last visit and they helped for very short amount of time. His left knee has been more symptomatic than his right, but both hurt badly. He is ready to proceed with surgical intervention at this time. They have been treated conservatively in the past for the above stated problem and despite conservative measures, they continue to have progressive pain and severe functional limitations and dysfunction. They have failed non-operative management including home exercise, medications, and injections. It is felt that they would benefit from undergoing total joint replacement. Risks and benefits of the procedure have been discussed with the patient and they elect to proceed with surgery. There are no active contraindications to surgery such as ongoing infection or rapidly progressive neurological disease.    Problem List/Past Medical Trochanteric bursitis of right hip (M70.61)  Primary osteoarthritis of both knees (M17.0)  Anxiety  Disorder  Depression  Diabetes Mellitus, Type II  Gout  High blood pressure  Kidney Stone  Osteoporosis  Pneumonia  Past History Hypercholesterolemia  Skin Cancer  Basal Cell Degenerative Disc Disease   Allergies  Tetanus Toxoid Adsorbed *Toxoids**  CeleBREX *ANALGESICS - ANTI-INFLAMMATORY*  Amisol Allergy  Family History  Cancer  Mother. Cerebrovascular Accident  Father. Depression  Father. Drug / Alcohol Addiction  Father. Heart Disease  Father. Hypertension  Mother. Osteoarthritis  Mother. Osteoporosis  Mother.  Social History Children  1 Current work status  disabled Exercise  Exercises weekly; does other and gym / [employer] Former drinker  11/26/2015: In the past drank beer and hard liquor 5-7 times per week Living situation  live with spouse Marital status  married No history of drug/alcohol rehab  Not under pain contract  Number of flights of stairs before winded  less than 1 Tobacco / smoke exposure  11/26/2015: yes outdoors only Tobacco use  Never smoker. 11/26/2015  Medication History Hydrocodone-Acetaminophen (10-325MG  Tablet, Oral) Active. MetFORMIN HCl (500MG  Tablet, Oral) Active. Nabumetone (500MG  Tablet, Oral) Active. AmLODIPine Besylate (10MG  Tablet, Oral) Active. Losartan Potassium (100MG  Tablet, Oral) Active. Metoprolol Succinate (100MG  Tablet ER, Oral) Active. FLUoxetine HCl (40MG  Capsule, Oral) Active. Tamsulosin HCl (0.4MG  Capsule, Oral) Active. Atorvastatin Calcium (40MG  Tablet, Oral) Active. Allopurinol (300MG  Tablet, Oral) Active. TiZANidine HCl (4MG  Tablet, Oral) Active. ClonazePAM (1MG  Tablet, Oral) Active. Fluticasone Propionate (Nasal) (50MCG/ACT Suspension, Nasal) Active. Aspirin (81MG  Tablet, Oral) Active. Multivitamin Adult (Oral) Active.  Past Surgical History  Arthroscopy of Knee  Date: 1995. left Colon Polyp Removal - Colonoscopy  Inguinal Hernia Repair  Date: 1999. open:  right Vasectomy    Review of  Systems  General Present- Fatigue. Not Present- Chills, Fever, Memory Loss, Night Sweats, Weight Gain and Weight Loss. Skin Not Present- Eczema, Hives, Itching, Lesions and Rash. HEENT Not Present- Dentures, Double Vision, Headache, Hearing Loss, Tinnitus and Visual Loss. Respiratory Not Present- Allergies, Chronic Cough, Coughing up blood, Shortness of breath at rest and Shortness of breath with exertion. Cardiovascular Not Present- Chest Pain, Difficulty Breathing Lying Down, Murmur, Palpitations, Racing/skipping heartbeats and Swelling. Gastrointestinal Not Present- Abdominal Pain, Bloody Stool, Constipation, Diarrhea, Difficulty Swallowing, Heartburn, Jaundice, Loss of appetitie, Nausea and Vomiting. Male Genitourinary Present- Urinary frequency and Weak urinary stream. Not Present- Blood in Urine, Discharge, Flank Pain, Incontinence, Painful Urination, Urgency, Urinary Retention and Urinating at Night. Musculoskeletal Present- Back Pain, Joint Pain, Joint Swelling, Morning Stiffness and Muscle Pain. Not Present- Muscle Weakness and Spasms. Neurological Not Present- Blackout spells, Difficulty with balance, Dizziness, Paralysis, Tremor and Weakness. Psychiatric Not Present- Insomnia.  Vitals Weight: 266 lb Height: 74in Body Surface Area: 2.45 m Body Mass Index: 34.15 kg/m  Pulse: 64 (Regular)  BP: 158/88 (Sitting, Right Arm, Standard)   Physical Exam General Mental Status -Alert, cooperative and good historian. General Appearance-pleasant, Not in acute distress. Orientation-Oriented X3. Build & Nutrition-Well nourished and Well developed.  Head and Neck Head-normocephalic, atraumatic . Neck Global Assessment - supple, no bruit auscultated on the right, no bruit auscultated on the left.  Eye Vision-Wears corrective lenses(readers). Pupil - Bilateral-Regular and Round. Motion - Bilateral-EOMI.  Chest and Lung  Exam Auscultation Breath sounds - clear at anterior chest wall and clear at posterior chest wall. Adventitious sounds - No Adventitious sounds.  Cardiovascular Auscultation Rhythm - Regular rate and rhythm. Heart Sounds - S1 WNL and S2 WNL. Murmurs & Other Heart Sounds - Auscultation of the heart reveals - No Murmurs.  Abdomen Palpation/Percussion Tenderness - Abdomen is non-tender to palpation. Rigidity (guarding) - Abdomen is soft. Auscultation Auscultation of the abdomen reveals - Bowel sounds normal.  Male Genitourinary Note: Not done, not pertinent to present illness   Musculoskeletal Note: He is alert and oriented, in no apparent distress. His hips show normal motion with no discomfort. Both knees show no effusion. Left knee, slight varus, range about 5 to 120. Moderate crepitus on range of motion with some tenderness medial greater than lateral, no instability noted. Right knee, no effusion, range 0 to 125. Slight crepitus on range of motion. Some tenderness medial greater than lateral with no instability. Pulse, sensation and motor intact in both lower extremities.  RADIOGRAPHS I reviewed his radiographs, AP both knees and lateral of both show he has bone on bone arthritis in the medial and patellofemoral compartments of both knees, worse on the left than the right.   Assessment & Plan  Primary osteoarthritis of left knee (M17.12)  Note:Surgical Plans: Left Total Knee Replacement  Disposition: Home with wife  PCP: Dr. Keith Rake  IV TXA  Anesthesia Issues: Woke up and sat up during his knee scope  Signed electronically by Ok Edwards, III PA-C

## 2016-04-24 ENCOUNTER — Inpatient Hospital Stay (HOSPITAL_COMMUNITY)
Admission: RE | Admit: 2016-04-24 | Discharge: 2016-04-26 | DRG: 470 | Disposition: A | Discharge status: Home Health | Payer: Medicare PPO | Source: Ambulatory Visit | Attending: Orthopedic Surgery | Admitting: Orthopedic Surgery

## 2016-04-24 ENCOUNTER — Inpatient Hospital Stay (HOSPITAL_COMMUNITY): Payer: Medicare PPO | Admitting: Certified Registered Nurse Anesthetist

## 2016-04-24 ENCOUNTER — Encounter (HOSPITAL_COMMUNITY)
Admission: RE | Disposition: A | Discharge status: Home Health | Payer: Self-pay | Source: Ambulatory Visit | Attending: Orthopedic Surgery

## 2016-04-24 ENCOUNTER — Encounter (HOSPITAL_COMMUNITY): Payer: Self-pay | Admitting: *Deleted

## 2016-04-24 DIAGNOSIS — Z823 Family history of stroke: Secondary | ICD-10-CM

## 2016-04-24 DIAGNOSIS — Z888 Allergy status to other drugs, medicaments and biological substances status: Secondary | ICD-10-CM

## 2016-04-24 DIAGNOSIS — Z809 Family history of malignant neoplasm, unspecified: Secondary | ICD-10-CM

## 2016-04-24 DIAGNOSIS — J309 Allergic rhinitis, unspecified: Secondary | ICD-10-CM | POA: Diagnosis not present

## 2016-04-24 DIAGNOSIS — F329 Major depressive disorder, single episode, unspecified: Secondary | ICD-10-CM | POA: Diagnosis present

## 2016-04-24 DIAGNOSIS — F419 Anxiety disorder, unspecified: Secondary | ICD-10-CM | POA: Diagnosis not present

## 2016-04-24 DIAGNOSIS — M109 Gout, unspecified: Secondary | ICD-10-CM | POA: Diagnosis present

## 2016-04-24 DIAGNOSIS — E7800 Pure hypercholesterolemia, unspecified: Secondary | ICD-10-CM | POA: Diagnosis present

## 2016-04-24 DIAGNOSIS — M1710 Unilateral primary osteoarthritis, unspecified knee: Secondary | ICD-10-CM | POA: Diagnosis not present

## 2016-04-24 DIAGNOSIS — F410 Panic disorder [episodic paroxysmal anxiety] without agoraphobia: Secondary | ICD-10-CM | POA: Diagnosis present

## 2016-04-24 DIAGNOSIS — Z8249 Family history of ischemic heart disease and other diseases of the circulatory system: Secondary | ICD-10-CM | POA: Diagnosis not present

## 2016-04-24 DIAGNOSIS — Z85828 Personal history of other malignant neoplasm of skin: Secondary | ICD-10-CM

## 2016-04-24 DIAGNOSIS — Z886 Allergy status to analgesic agent status: Secondary | ICD-10-CM | POA: Diagnosis not present

## 2016-04-24 DIAGNOSIS — I10 Essential (primary) hypertension: Secondary | ICD-10-CM | POA: Diagnosis not present

## 2016-04-24 DIAGNOSIS — M1712 Unilateral primary osteoarthritis, left knee: Principal | ICD-10-CM | POA: Diagnosis present

## 2016-04-24 DIAGNOSIS — E785 Hyperlipidemia, unspecified: Secondary | ICD-10-CM | POA: Diagnosis present

## 2016-04-24 DIAGNOSIS — E119 Type 2 diabetes mellitus without complications: Secondary | ICD-10-CM | POA: Diagnosis not present

## 2016-04-24 DIAGNOSIS — M179 Osteoarthritis of knee, unspecified: Secondary | ICD-10-CM

## 2016-04-24 DIAGNOSIS — Z86010 Personal history of colonic polyps: Secondary | ICD-10-CM | POA: Diagnosis not present

## 2016-04-24 DIAGNOSIS — Z811 Family history of alcohol abuse and dependence: Secondary | ICD-10-CM | POA: Diagnosis not present

## 2016-04-24 DIAGNOSIS — Z887 Allergy status to serum and vaccine status: Secondary | ICD-10-CM

## 2016-04-24 DIAGNOSIS — M810 Age-related osteoporosis without current pathological fracture: Secondary | ICD-10-CM | POA: Diagnosis present

## 2016-04-24 DIAGNOSIS — M25562 Pain in left knee: Secondary | ICD-10-CM | POA: Diagnosis present

## 2016-04-24 HISTORY — PX: TOTAL KNEE ARTHROPLASTY: SHX125

## 2016-04-24 LAB — GLUCOSE, CAPILLARY
GLUCOSE-CAPILLARY: 177 mg/dL — AB (ref 65–99)
GLUCOSE-CAPILLARY: 205 mg/dL — AB (ref 65–99)
Glucose-Capillary: 167 mg/dL — ABNORMAL HIGH (ref 65–99)
Glucose-Capillary: 214 mg/dL — ABNORMAL HIGH (ref 65–99)

## 2016-04-24 LAB — TYPE AND SCREEN
ABO/RH(D): A POS
Antibody Screen: NEGATIVE

## 2016-04-24 SURGERY — ARTHROPLASTY, KNEE, TOTAL
Anesthesia: Spinal | Site: Knee | Laterality: Left

## 2016-04-24 MED ORDER — CEFAZOLIN SODIUM-DEXTROSE 2-4 GM/100ML-% IV SOLN
INTRAVENOUS | Status: AC
  Filled 2016-04-24: qty 100

## 2016-04-24 MED ORDER — OXYCODONE HCL 5 MG PO TABS
5.0000 mg | ORAL_TABLET | ORAL | Status: DC | PRN
  Administered 2016-04-24: 20:00:00 20 mg via ORAL
  Administered 2016-04-24: 10 mg via ORAL
  Administered 2016-04-24 (×2): 20 mg via ORAL
  Administered 2016-04-25 (×2): 10 mg via ORAL
  Administered 2016-04-25 (×2): 20 mg via ORAL
  Administered 2016-04-25 – 2016-04-26 (×4): 10 mg via ORAL
  Filled 2016-04-24: qty 2
  Filled 2016-04-24: qty 4
  Filled 2016-04-24: qty 2
  Filled 2016-04-24 (×2): qty 4
  Filled 2016-04-24: qty 2
  Filled 2016-04-24: qty 4
  Filled 2016-04-24 (×3): qty 2
  Filled 2016-04-24: qty 4
  Filled 2016-04-24: qty 2

## 2016-04-24 MED ORDER — EPHEDRINE SULFATE 50 MG/ML IJ SOLN
INTRAMUSCULAR | Status: DC | PRN
Start: 2016-04-24 — End: 2016-04-24
  Administered 2016-04-24 (×3): 10 mg via INTRAVENOUS

## 2016-04-24 MED ORDER — DEXAMETHASONE SODIUM PHOSPHATE 10 MG/ML IJ SOLN
10.0000 mg | Freq: Once | INTRAMUSCULAR | Status: AC
  Administered 2016-04-24: 10 mg via INTRAVENOUS

## 2016-04-24 MED ORDER — PROPOFOL 10 MG/ML IV BOLUS
INTRAVENOUS | Status: AC
  Filled 2016-04-24: qty 20

## 2016-04-24 MED ORDER — CEFAZOLIN SODIUM-DEXTROSE 2-4 GM/100ML-% IV SOLN
2.0000 g | Freq: Four times a day (QID) | INTRAVENOUS | Status: AC
  Administered 2016-04-24 (×2): 2 g via INTRAVENOUS
  Filled 2016-04-24 (×2): qty 100

## 2016-04-24 MED ORDER — METOPROLOL SUCCINATE ER 25 MG PO TB24: 25.0000 mg | ORAL_TABLET | ORAL | Status: DC

## 2016-04-24 MED ORDER — ALLOPURINOL 300 MG PO TABS
300.0000 mg | ORAL_TABLET | Freq: Every evening | ORAL | Status: DC
  Administered 2016-04-24 – 2016-04-25 (×2): 300 mg via ORAL
  Filled 2016-04-24 (×2): qty 1

## 2016-04-24 MED ORDER — FENTANYL CITRATE (PF) 100 MCG/2ML IJ SOLN
50.0000 ug | INTRAMUSCULAR | Status: DC | PRN
  Administered 2016-04-24: 100 ug via INTRAVENOUS

## 2016-04-24 MED ORDER — SODIUM CHLORIDE 0.9 % IJ SOLN
INTRAMUSCULAR | Status: DC | PRN
  Administered 2016-04-24: 30 mL

## 2016-04-24 MED ORDER — LACTATED RINGERS IV SOLN
INTRAVENOUS | Status: DC
  Administered 2016-04-24 (×2): via INTRAVENOUS

## 2016-04-24 MED ORDER — POLYETHYLENE GLYCOL 3350 17 G PO PACK: 17.0000 g | PACK | Freq: Every day | ORAL | Status: DC | PRN

## 2016-04-24 MED ORDER — SODIUM CHLORIDE 0.9 % IV SOLN
1000.0000 mg | Freq: Once | INTRAVENOUS | Status: AC
  Administered 2016-04-24: 1000 mg via INTRAVENOUS
  Filled 2016-04-24: qty 10

## 2016-04-24 MED ORDER — LOSARTAN POTASSIUM 50 MG PO TABS
100.0000 mg | ORAL_TABLET | Freq: Every day | ORAL | Status: DC
  Administered 2016-04-24 – 2016-04-25 (×2): 100 mg via ORAL
  Filled 2016-04-24 (×2): qty 2

## 2016-04-24 MED ORDER — GLIPIZIDE 5 MG PO TABS
5.0000 mg | ORAL_TABLET | Freq: Every day | ORAL | Status: DC
  Administered 2016-04-25 – 2016-04-26 (×2): 5 mg via ORAL
  Filled 2016-04-24 (×2): qty 1

## 2016-04-24 MED ORDER — MORPHINE SULFATE (PF) 2 MG/ML IV SOLN
1.0000 mg | INTRAVENOUS | Status: DC | PRN
  Administered 2016-04-24 (×2): 1 mg via INTRAVENOUS
  Filled 2016-04-24 (×2): qty 1

## 2016-04-24 MED ORDER — MIDAZOLAM HCL 5 MG/5ML IJ SOLN
INTRAMUSCULAR | Status: DC | PRN
  Administered 2016-04-24 (×2): 1 mg via INTRAVENOUS

## 2016-04-24 MED ORDER — PROPOFOL 10 MG/ML IV BOLUS
INTRAVENOUS | Status: AC
  Filled 2016-04-24: qty 40

## 2016-04-24 MED ORDER — PROPOFOL 500 MG/50ML IV EMUL
INTRAVENOUS | Status: DC | PRN
  Administered 2016-04-24: 125 ug/kg/min via INTRAVENOUS

## 2016-04-24 MED ORDER — FENTANYL CITRATE (PF) 100 MCG/2ML IJ SOLN
INTRAMUSCULAR | Status: AC
Start: 2016-04-24 — End: 2016-04-24
  Filled 2016-04-24: qty 2

## 2016-04-24 MED ORDER — AMLODIPINE BESYLATE 10 MG PO TABS
10.0000 mg | ORAL_TABLET | Freq: Every day | ORAL | Status: DC
  Administered 2016-04-25: 11:00:00 10 mg via ORAL
  Filled 2016-04-24 (×2): qty 1

## 2016-04-24 MED ORDER — MENTHOL 3 MG MT LOZG: 1.0000 | LOZENGE | OROMUCOSAL | Status: DC | PRN

## 2016-04-24 MED ORDER — STERILE WATER FOR IRRIGATION IR SOLN
Status: DC | PRN
  Administered 2016-04-24: 2000 mL

## 2016-04-24 MED ORDER — FENTANYL CITRATE (PF) 100 MCG/2ML IJ SOLN: 25.0000 ug | INTRAMUSCULAR | Status: DC | PRN

## 2016-04-24 MED ORDER — ACETAMINOPHEN 500 MG PO TABS
1000.0000 mg | ORAL_TABLET | Freq: Four times a day (QID) | ORAL | Status: AC
  Administered 2016-04-24 – 2016-04-25 (×4): 1000 mg via ORAL
  Filled 2016-04-24 (×4): qty 2

## 2016-04-24 MED ORDER — ONDANSETRON HCL 4 MG PO TABS: 4.0000 mg | ORAL_TABLET | Freq: Four times a day (QID) | ORAL | Status: DC | PRN

## 2016-04-24 MED ORDER — BUPIVACAINE HCL (PF) 0.25 % IJ SOLN
INTRAMUSCULAR | Status: AC
  Filled 2016-04-24: qty 30

## 2016-04-24 MED ORDER — ACETAMINOPHEN 650 MG RE SUPP: 650.0000 mg | Freq: Four times a day (QID) | RECTAL | Status: DC | PRN

## 2016-04-24 MED ORDER — RIVAROXABAN 10 MG PO TABS
10.0000 mg | ORAL_TABLET | Freq: Every day | ORAL | Status: DC
  Administered 2016-04-25 – 2016-04-26 (×2): 10 mg via ORAL
  Filled 2016-04-24 (×2): qty 1

## 2016-04-24 MED ORDER — METOCLOPRAMIDE HCL 5 MG PO TABS: 5.0000 mg | ORAL_TABLET | Freq: Three times a day (TID) | ORAL | Status: DC | PRN

## 2016-04-24 MED ORDER — CLONAZEPAM 1 MG PO TABS
1.0000 mg | ORAL_TABLET | Freq: Every day | ORAL | Status: DC
  Administered 2016-04-24 – 2016-04-25 (×2): 1 mg via ORAL
  Filled 2016-04-24: qty 2
  Filled 2016-04-24: qty 1

## 2016-04-24 MED ORDER — DEXAMETHASONE SODIUM PHOSPHATE 10 MG/ML IJ SOLN
10.0000 mg | Freq: Once | INTRAMUSCULAR | Status: DC
Start: 2016-04-25 — End: 2016-04-26

## 2016-04-24 MED ORDER — ACETAMINOPHEN 10 MG/ML IV SOLN
1000.0000 mg | Freq: Once | INTRAVENOUS | Status: AC
  Administered 2016-04-24: 1000 mg via INTRAVENOUS
  Filled 2016-04-24: qty 100

## 2016-04-24 MED ORDER — DOCUSATE SODIUM 100 MG PO CAPS
100.0000 mg | ORAL_CAPSULE | Freq: Two times a day (BID) | ORAL | Status: DC
  Administered 2016-04-24 – 2016-04-26 (×4): 100 mg via ORAL
  Filled 2016-04-24 (×4): qty 1

## 2016-04-24 MED ORDER — METOPROLOL SUCCINATE ER 50 MG PO TB24
125.0000 mg | ORAL_TABLET | Freq: Every day | ORAL | Status: DC
  Administered 2016-04-25: 125 mg via ORAL
  Filled 2016-04-24 (×2): qty 1

## 2016-04-24 MED ORDER — SODIUM CHLORIDE 0.9 % IR SOLN
Status: DC | PRN
  Administered 2016-04-24: 1000 mL

## 2016-04-24 MED ORDER — BUPIVACAINE LIPOSOME 1.3 % IJ SUSP
INTRAMUSCULAR | Status: DC | PRN
  Administered 2016-04-24: 20 mL

## 2016-04-24 MED ORDER — SODIUM CHLORIDE 0.9 % IV SOLN
INTRAVENOUS | Status: DC
  Administered 2016-04-24: 15:00:00 via INTRAVENOUS

## 2016-04-24 MED ORDER — METOPROLOL SUCCINATE ER 50 MG PO TB24: 100.0000 mg | ORAL_TABLET | ORAL | Status: DC

## 2016-04-24 MED ORDER — ACETAMINOPHEN 10 MG/ML IV SOLN
INTRAVENOUS | Status: AC
  Filled 2016-04-24: qty 100

## 2016-04-24 MED ORDER — 0.9 % SODIUM CHLORIDE (POUR BTL) OPTIME
TOPICAL | Status: DC | PRN
  Administered 2016-04-24: 1000 mL

## 2016-04-24 MED ORDER — DIPHENHYDRAMINE HCL 12.5 MG/5ML PO ELIX: 12.5000 mg | ORAL_SOLUTION | ORAL | Status: DC | PRN

## 2016-04-24 MED ORDER — ATORVASTATIN CALCIUM 20 MG PO TABS
40.0000 mg | ORAL_TABLET | Freq: Every day | ORAL | Status: DC
  Administered 2016-04-24 – 2016-04-25 (×2): 40 mg via ORAL
  Filled 2016-04-24 (×2): qty 2

## 2016-04-24 MED ORDER — METHOCARBAMOL 500 MG PO TABS
500.0000 mg | ORAL_TABLET | Freq: Four times a day (QID) | ORAL | Status: DC | PRN
  Administered 2016-04-24 – 2016-04-26 (×3): 500 mg via ORAL
  Filled 2016-04-24 (×4): qty 1

## 2016-04-24 MED ORDER — BUPIVACAINE LIPOSOME 1.3 % IJ SUSP
20.0000 mL | Freq: Once | INTRAMUSCULAR | Status: DC
  Filled 2016-04-24: qty 20

## 2016-04-24 MED ORDER — BISACODYL 10 MG RE SUPP: 10.0000 mg | Freq: Every day | RECTAL | Status: DC | PRN

## 2016-04-24 MED ORDER — METHOCARBAMOL 1000 MG/10ML IJ SOLN
500.0000 mg | Freq: Four times a day (QID) | INTRAVENOUS | Status: DC | PRN
  Administered 2016-04-24: 500 mg via INTRAVENOUS
  Filled 2016-04-24: qty 550
  Filled 2016-04-24: qty 5

## 2016-04-24 MED ORDER — METOCLOPRAMIDE HCL 5 MG/ML IJ SOLN: 5.0000 mg | Freq: Three times a day (TID) | INTRAMUSCULAR | Status: DC | PRN

## 2016-04-24 MED ORDER — MIDAZOLAM HCL 2 MG/2ML IJ SOLN
1.0000 mg | INTRAMUSCULAR | Status: DC | PRN
  Administered 2016-04-24: 2 mg via INTRAVENOUS
  Filled 2016-04-24: qty 2

## 2016-04-24 MED ORDER — FLUOXETINE HCL 20 MG PO CAPS
40.0000 mg | ORAL_CAPSULE | Freq: Every day | ORAL | Status: DC
  Administered 2016-04-25 – 2016-04-26 (×2): 40 mg via ORAL
  Filled 2016-04-24 (×2): qty 2

## 2016-04-24 MED ORDER — BUPIVACAINE HCL (PF) 0.5 % IJ SOLN
INTRAMUSCULAR | Status: AC
  Filled 2016-04-24: qty 30

## 2016-04-24 MED ORDER — DEXAMETHASONE SODIUM PHOSPHATE 10 MG/ML IJ SOLN
INTRAMUSCULAR | Status: AC
  Filled 2016-04-24: qty 1

## 2016-04-24 MED ORDER — PHENOL 1.4 % MT LIQD: 1.0000 | OROMUCOSAL | Status: DC | PRN

## 2016-04-24 MED ORDER — ACETAMINOPHEN 325 MG PO TABS: 650.0000 mg | ORAL_TABLET | Freq: Four times a day (QID) | ORAL | Status: DC | PRN

## 2016-04-24 MED ORDER — FLEET ENEMA 7-19 GM/118ML RE ENEM: 1.0000 | ENEMA | Freq: Once | RECTAL | Status: DC | PRN

## 2016-04-24 MED ORDER — ONDANSETRON HCL 4 MG/2ML IJ SOLN
INTRAMUSCULAR | Status: AC
  Filled 2016-04-24: qty 2

## 2016-04-24 MED ORDER — LACTATED RINGERS IV SOLN
INTRAVENOUS | Status: DC
  Administered 2016-04-24: 1000 mL via INTRAVENOUS

## 2016-04-24 MED ORDER — MIDAZOLAM HCL 2 MG/2ML IJ SOLN
INTRAMUSCULAR | Status: AC
  Filled 2016-04-24: qty 2

## 2016-04-24 MED ORDER — INSULIN ASPART 100 UNIT/ML ~~LOC~~ SOLN
0.0000 [IU] | Freq: Three times a day (TID) | SUBCUTANEOUS | Status: DC
  Administered 2016-04-24 – 2016-04-25 (×2): 5 [IU] via SUBCUTANEOUS
  Administered 2016-04-25: 3 [IU] via SUBCUTANEOUS
  Administered 2016-04-25: 6 [IU] via SUBCUTANEOUS
  Administered 2016-04-26 (×2): 5 [IU] via SUBCUTANEOUS
  Filled 2016-04-24 (×4): qty 1

## 2016-04-24 MED ORDER — ONDANSETRON HCL 4 MG/2ML IJ SOLN: 4.0000 mg | Freq: Four times a day (QID) | INTRAMUSCULAR | Status: DC | PRN

## 2016-04-24 MED ORDER — CEFAZOLIN SODIUM-DEXTROSE 2-4 GM/100ML-% IV SOLN
2.0000 g | INTRAVENOUS | Status: AC
  Administered 2016-04-24: 2 g via INTRAVENOUS

## 2016-04-24 MED ORDER — EPHEDRINE 5 MG/ML INJ
INTRAVENOUS | Status: AC
  Filled 2016-04-24: qty 10

## 2016-04-24 MED ORDER — FENTANYL CITRATE (PF) 100 MCG/2ML IJ SOLN
INTRAMUSCULAR | Status: AC
  Filled 2016-04-24: qty 2

## 2016-04-24 MED ORDER — BUPIVACAINE IN DEXTROSE 0.75-8.25 % IT SOLN
INTRATHECAL | Status: DC | PRN
  Administered 2016-04-24: 2 mL via INTRATHECAL

## 2016-04-24 MED ORDER — EPINEPHRINE PF 1 MG/ML IJ SOLN
INTRAMUSCULAR | Status: AC
  Filled 2016-04-24: qty 1

## 2016-04-24 MED ORDER — ONDANSETRON HCL 4 MG/2ML IJ SOLN
INTRAMUSCULAR | Status: DC | PRN
  Administered 2016-04-24: 4 mg via INTRAVENOUS

## 2016-04-24 MED ORDER — SODIUM CHLORIDE 0.9 % IJ SOLN
INTRAMUSCULAR | Status: AC
  Filled 2016-04-24: qty 50

## 2016-04-24 MED ORDER — FENTANYL CITRATE (PF) 100 MCG/2ML IJ SOLN
INTRAMUSCULAR | Status: DC | PRN
  Administered 2016-04-24: 50 ug via INTRAVENOUS

## 2016-04-24 MED ORDER — TAMSULOSIN HCL 0.4 MG PO CAPS
0.4000 mg | ORAL_CAPSULE | Freq: Every day | ORAL | Status: DC
  Administered 2016-04-24 – 2016-04-25 (×2): 0.4 mg via ORAL
  Filled 2016-04-24 (×2): qty 1

## 2016-04-24 MED ORDER — FLUTICASONE PROPIONATE 50 MCG/ACT NA SUSP
2.0000 | Freq: Every day | NASAL | Status: DC | PRN
  Filled 2016-04-24: qty 16

## 2016-04-24 MED ORDER — TRANEXAMIC ACID 1000 MG/10ML IV SOLN
1000.0000 mg | INTRAVENOUS | Status: AC
  Administered 2016-04-24: 1000 mg via INTRAVENOUS
  Filled 2016-04-24: qty 1100

## 2016-04-24 SURGICAL SUPPLY — 50 items
BAG ZIPLOCK 12X15 (MISCELLANEOUS) ×3 IMPLANT
BANDAGE ACE 6X5 VEL STRL LF (GAUZE/BANDAGES/DRESSINGS) ×3 IMPLANT
BLADE SAG 18X100X1.27 (BLADE) ×3 IMPLANT
BLADE SAW SGTL 11.0X1.19X90.0M (BLADE) ×3 IMPLANT
BOWL SMART MIX CTS (DISPOSABLE) ×3 IMPLANT
CAPT KNEE TOTAL 3 ATTUNE ×3 IMPLANT
CEMENT HV SMART SET (Cement) ×6 IMPLANT
CLOSURE WOUND 1/2 X4 (GAUZE/BANDAGES/DRESSINGS) ×2
CLOTH BEACON ORANGE TIMEOUT ST (SAFETY) ×3 IMPLANT
CUFF TOURN SGL QUICK 34 (TOURNIQUET CUFF) ×2
CUFF TRNQT CYL 34X4X40X1 (TOURNIQUET CUFF) ×1 IMPLANT
DRAPE U-SHAPE 47X51 STRL (DRAPES) ×3 IMPLANT
DRSG ADAPTIC 3X8 NADH LF (GAUZE/BANDAGES/DRESSINGS) ×3 IMPLANT
DURAPREP 26ML APPLICATOR (WOUND CARE) ×3 IMPLANT
ELECT REM PT RETURN 9FT ADLT (ELECTROSURGICAL) ×3
ELECTRODE REM PT RTRN 9FT ADLT (ELECTROSURGICAL) ×1 IMPLANT
EVACUATOR 1/8 PVC DRAIN (DRAIN) ×3 IMPLANT
GAUZE SPONGE 4X4 12PLY STRL (GAUZE/BANDAGES/DRESSINGS) ×3 IMPLANT
GLOVE BIO SURGEON STRL SZ8 (GLOVE) ×6 IMPLANT
GLOVE BIOGEL PI IND STRL 6.5 (GLOVE) ×1 IMPLANT
GLOVE BIOGEL PI IND STRL 7.5 (GLOVE) ×2 IMPLANT
GLOVE BIOGEL PI IND STRL 8 (GLOVE) ×1 IMPLANT
GLOVE BIOGEL PI INDICATOR 6.5 (GLOVE) ×2
GLOVE BIOGEL PI INDICATOR 7.5 (GLOVE) ×4
GLOVE BIOGEL PI INDICATOR 8 (GLOVE) ×2
GLOVE SURG ORTHO 7.0 STRL STRW (GLOVE) ×3 IMPLANT
GLOVE SURG SS PI 6.5 STRL IVOR (GLOVE) ×3 IMPLANT
GLOVE SURG SS PI 7.5 STRL IVOR (GLOVE) ×6 IMPLANT
GOWN SPEC L3 XXLG W/TWL (GOWN DISPOSABLE) ×3 IMPLANT
GOWN STRL REUS W/ TWL XL LVL3 (GOWN DISPOSABLE) ×1 IMPLANT
GOWN STRL REUS W/TWL LRG LVL3 (GOWN DISPOSABLE) ×3 IMPLANT
GOWN STRL REUS W/TWL XL LVL3 (GOWN DISPOSABLE) ×5 IMPLANT
HANDPIECE INTERPULSE COAX TIP (DISPOSABLE) ×2
IMMOBILIZER KNEE 20 (SOFTGOODS) ×3
IMMOBILIZER KNEE 20 THIGH 36 (SOFTGOODS) ×1 IMPLANT
MANIFOLD NEPTUNE II (INSTRUMENTS) ×3 IMPLANT
PACK TOTAL KNEE CUSTOM (KITS) ×3 IMPLANT
PAD ABD 8X10 STRL (GAUZE/BANDAGES/DRESSINGS) ×3 IMPLANT
PADDING CAST COTTON 6X4 STRL (CAST SUPPLIES) ×9 IMPLANT
POSITIONER SURGICAL ARM (MISCELLANEOUS) ×3 IMPLANT
SET HNDPC FAN SPRY TIP SCT (DISPOSABLE) ×1 IMPLANT
STRIP CLOSURE SKIN 1/2X4 (GAUZE/BANDAGES/DRESSINGS) ×4 IMPLANT
SUT MNCRL AB 4-0 PS2 18 (SUTURE) ×3 IMPLANT
SUT VIC AB 2-0 CT1 27 (SUTURE) ×6
SUT VIC AB 2-0 CT1 TAPERPNT 27 (SUTURE) ×3 IMPLANT
SUT VLOC 180 0 24IN GS25 (SUTURE) ×3 IMPLANT
SYR 50ML LL SCALE MARK (SYRINGE) ×3 IMPLANT
TRAY FOLEY W/METER SILVER 16FR (SET/KITS/TRAYS/PACK) ×3 IMPLANT
WRAP KNEE MAXI GEL POST OP (GAUZE/BANDAGES/DRESSINGS) ×3 IMPLANT
YANKAUER SUCT BULB TIP 10FT TU (MISCELLANEOUS) ×3 IMPLANT

## 2016-04-24 NOTE — Interval H&P Note (Signed)
History and Physical Interval Note:  04/24/2016 10:29 AM  Evan Rivas  has presented today for surgery, with the diagnosis of LEFT KNEE OA  The various methods of treatment have been discussed with the patient and family. After consideration of risks, benefits and other options for treatment, the patient has consented to  Procedure(s): LEFT TOTAL KNEE ARTHROPLASTY (Left) as a surgical intervention .  The patient's history has been reviewed, patient examined, no change in status, stable for surgery.  I have reviewed the patient's chart and labs.  Questions were answered to the patient's satisfaction.     Gearlean Alf

## 2016-04-24 NOTE — Evaluation (Signed)
Physical Therapy Evaluation Patient Details Name: Evan Rivas MRN: [id number] DOB: 1951/02/09 Today's Date: 04/24/2016   History of Present Illness  Pt is a 66 year old male s/p L TKA  Clinical Impression  Pt is s/p TKA resulting in the deficits listed below (see PT Problem List).  Pt will benefit from skilled PT to increase their independence and safety with mobility to allow discharge to the venue listed below.  Pt tolerated short distance ambulation well POD #0 and plans to d/c home with family assist.     Follow Up Recommendations Home health PT    Equipment Recommendations  Rolling walker with 5" wheels    Recommendations for Other Services       Precautions / Restrictions Precautions Precautions: Knee Required Braces or Orthoses: Knee Immobilizer - Left Knee Immobilizer - Left: Discontinue once straight leg raise with < 10 degree lag Restrictions Other Position/Activity Restrictions: WBAT      Mobility  Bed Mobility Overal bed mobility: Needs Assistance Bed Mobility: Supine to Sit     Supine to sit: Supervision     General bed mobility comments: verbal cues for technique  Transfers Overall transfer level: Needs assistance Equipment used: Rolling walker (2 wheeled) Transfers: Sit to/from Stand Sit to Stand: Min assist         General transfer comment: verbal cues for UE and LE positioning, assist for steadying while bringing UEs to RW  Ambulation/Gait Ambulation/Gait assistance: Min assist Ambulation Distance (Feet): 40 Feet Assistive device: Rolling walker (2 wheeled) Gait Pattern/deviations: Step-to pattern;Step-through pattern;Decreased stance time - left;Antalgic     General Gait Details: verbal cues for sequence, RW positioning, step length  Stairs            Wheelchair Mobility    Modified Rankin (Stroke Patients Only)       Balance                                             Pertinent Vitals/Pain Pain  Assessment: 0-10 Pain Score: 5  Pain Location: L knee Pain Descriptors / Indicators: Aching;Sore Pain Intervention(s): Limited activity within patient's tolerance;Monitored during session;Repositioned;Premedicated before session    Home Living Family/patient expects to be discharged to:: Private residence Living Arrangements: Spouse/significant other Available Help at Discharge: Family Type of Home: House Home Access: Stairs to enter   Technical brewer of Steps: threshold Home Layout: One level Home Equipment: Toilet riser      Prior Function Level of Independence: Independent               Hand Dominance        Extremity/Trunk Assessment        Lower Extremity Assessment Lower Extremity Assessment: RLE deficits/detail;LLE deficits/detail RLE Deficits / Details: knee varus deformity LLE Deficits / Details: able to perform SLR today, ROM TBA       Communication   Communication: No difficulties  Cognition Arousal/Alertness: Awake/alert Behavior During Therapy: WFL for tasks assessed/performed Overall Cognitive Status: Within Functional Limits for tasks assessed                      General Comments      Exercises     Assessment/Plan    PT Assessment Patient needs continued PT services  PT Problem List Decreased strength;Decreased range of motion;Decreased knowledge of use of DME;Decreased mobility;Pain;Decreased knowledge of precautions  PT Treatment Interventions Functional mobility training;Gait training;Stair training;DME instruction;Therapeutic activities;Therapeutic exercise;Patient/family education    PT Goals (Current goals can be found in the Care Plan section)  Acute Rehab PT Goals PT Goal Formulation: With patient Time For Goal Achievement: 04/28/16 Potential to Achieve Goals: Good    Frequency 7X/week   Barriers to discharge        Co-evaluation               End of Session Equipment Utilized During  Treatment: Gait belt;Left knee immobilizer Activity Tolerance: Patient tolerated treatment well Patient left: in chair;with call bell/phone within reach;with chair alarm set Nurse Communication: Mobility status         Time: [id number] PT Time Calculation (min) (ACUTE ONLY): 14 min   Charges:   PT Evaluation $PT Eval Low Complexity: 1 Procedure     PT G Codes:        Margarito Dehaas,KATHrine E 04/24/2016, 5:04 PM Carmelia Bake, PT, DPT 04/24/2016 Pager: [phone]

## 2016-04-24 NOTE — H&P (View-Only) (Signed)
Evan Rivas DOB: [birth year] Married / Language: Evan Rivas / Race: White Male Date of Admission:  04/24/2016 CC:  Left knee pain History of Present Illness The patient is a 66 year old male who comes in  for a preoperative History and Physical. The patient is scheduled for a left total knee arthroplasty to be performed by Dr. Dione Plover. Aluisio, MD at [facility] on 04-24-2016. The patient is a 66 year old male who presented for follow up of their knee. The patient is being followed for their bilateral osteoarthritis. They are now months out from bilateral cortisone injections. The patient feels that they feel their knees have improved only temporarily following cortisone. Current treatment includes: NSAIDs. He has had significant problems with both knees for quite a while now. He has documented bone on bone arthritis in the medial and patellofemoral compartments of each knee. He said the knees are preventing him from doing things he desires. He had cortisone injections last visit and they helped for very short amount of time. His left knee has been more symptomatic than his right, but both hurt badly. He is ready to proceed with surgical intervention at this time. They have been treated conservatively in the past for the above stated problem and despite conservative measures, they continue to have progressive pain and severe functional limitations and dysfunction. They have failed non-operative management including home exercise, medications, and injections. It is felt that they would benefit from undergoing total joint replacement. Risks and benefits of the procedure have been discussed with the patient and they elect to proceed with surgery. There are no active contraindications to surgery such as ongoing infection or rapidly progressive neurological disease.    Problem List/Past Medical Trochanteric bursitis of right hip (M70.61)  Primary osteoarthritis of both knees (M17.0)  Anxiety  Disorder  Depression  Diabetes Mellitus, Type II  Gout  High blood pressure  Kidney Stone  Osteoporosis  Pneumonia  Past History Hypercholesterolemia  Skin Cancer  Basal Cell Degenerative Disc Disease   Allergies  Tetanus Toxoid Adsorbed *Toxoids**  CeleBREX *ANALGESICS - ANTI-INFLAMMATORY*  Amisol Allergy  Family History  Cancer  Mother. Cerebrovascular Accident  Father. Depression  Father. Drug / Alcohol Addiction  Father. Heart Disease  Father. Hypertension  Mother. Osteoarthritis  Mother. Osteoporosis  Mother.  Social History Children  1 Current work status  disabled Exercise  Exercises weekly; does other and gym / [employer] Former drinker  11/26/2015: In the past drank beer and hard liquor 5-7 times per week Living situation  live with spouse Marital status  married No history of drug/alcohol rehab  Not under pain contract  Number of flights of stairs before winded  less than 1 Tobacco / smoke exposure  11/26/2015: yes outdoors only Tobacco use  Never smoker. 11/26/2015  Medication History Hydrocodone-Acetaminophen (10-325MG  Tablet, Oral) Active. MetFORMIN HCl (500MG  Tablet, Oral) Active. Nabumetone (500MG  Tablet, Oral) Active. AmLODIPine Besylate (10MG  Tablet, Oral) Active. Losartan Potassium (100MG  Tablet, Oral) Active. Metoprolol Succinate (100MG  Tablet ER, Oral) Active. FLUoxetine HCl (40MG  Capsule, Oral) Active. Tamsulosin HCl (0.4MG  Capsule, Oral) Active. Atorvastatin Calcium (40MG  Tablet, Oral) Active. Allopurinol (300MG  Tablet, Oral) Active. TiZANidine HCl (4MG  Tablet, Oral) Active. ClonazePAM (1MG  Tablet, Oral) Active. Fluticasone Propionate (Nasal) (50MCG/ACT Suspension, Nasal) Active. Aspirin (81MG  Tablet, Oral) Active. Multivitamin Adult (Oral) Active.  Past Surgical History  Arthroscopy of Knee  Date: 1995. left Colon Polyp Removal - Colonoscopy  Inguinal Hernia Repair  Date: 1999. open:  right Vasectomy    Review of  Systems  General Present- Fatigue. Not Present- Chills, Fever, Memory Loss, Night Sweats, Weight Gain and Weight Loss. Skin Not Present- Eczema, Hives, Itching, Lesions and Rash. HEENT Not Present- Dentures, Double Vision, Headache, Hearing Loss, Tinnitus and Visual Loss. Respiratory Not Present- Allergies, Chronic Cough, Coughing up blood, Shortness of breath at rest and Shortness of breath with exertion. Cardiovascular Not Present- Chest Pain, Difficulty Breathing Lying Down, Murmur, Palpitations, Racing/skipping heartbeats and Swelling. Gastrointestinal Not Present- Abdominal Pain, Bloody Stool, Constipation, Diarrhea, Difficulty Swallowing, Heartburn, Jaundice, Loss of appetitie, Nausea and Vomiting. Male Genitourinary Present- Urinary frequency and Weak urinary stream. Not Present- Blood in Urine, Discharge, Flank Pain, Incontinence, Painful Urination, Urgency, Urinary Retention and Urinating at Night. Musculoskeletal Present- Back Pain, Joint Pain, Joint Swelling, Morning Stiffness and Muscle Pain. Not Present- Muscle Weakness and Spasms. Neurological Not Present- Blackout spells, Difficulty with balance, Dizziness, Paralysis, Tremor and Weakness. Psychiatric Not Present- Insomnia.  Vitals Weight: 266 lb Height: 74in Body Surface Area: 2.45 m Body Mass Index: 34.15 kg/m  Pulse: 64 (Regular)  BP: 158/88 (Sitting, Right Arm, Standard)   Physical Exam General Mental Status -Alert, cooperative and good historian. General Appearance-pleasant, Not in acute distress. Orientation-Oriented X3. Build & Nutrition-Well nourished and Well developed.  Head and Neck Head-normocephalic, atraumatic . Neck Global Assessment - supple, no bruit auscultated on the right, no bruit auscultated on the left.  Eye Vision-Wears corrective lenses(readers). Pupil - Bilateral-Regular and Round. Motion - Bilateral-EOMI.  Chest and Lung  Exam Auscultation Breath sounds - clear at anterior chest wall and clear at posterior chest wall. Adventitious sounds - No Adventitious sounds.  Cardiovascular Auscultation Rhythm - Regular rate and rhythm. Heart Sounds - S1 WNL and S2 WNL. Murmurs & Other Heart Sounds - Auscultation of the heart reveals - No Murmurs.  Abdomen Palpation/Percussion Tenderness - Abdomen is non-tender to palpation. Rigidity (guarding) - Abdomen is soft. Auscultation Auscultation of the abdomen reveals - Bowel sounds normal.  Male Genitourinary Note: Not done, not pertinent to present illness   Musculoskeletal Note: He is alert and oriented, in no apparent distress. His hips show normal motion with no discomfort. Both knees show no effusion. Left knee, slight varus, range about 5 to 120. Moderate crepitus on range of motion with some tenderness medial greater than lateral, no instability noted. Right knee, no effusion, range 0 to 125. Slight crepitus on range of motion. Some tenderness medial greater than lateral with no instability. Pulse, sensation and motor intact in both lower extremities.  RADIOGRAPHS I reviewed his radiographs, AP both knees and lateral of both show he has bone on bone arthritis in the medial and patellofemoral compartments of both knees, worse on the left than the right.   Assessment & Plan  Primary osteoarthritis of left knee (M17.12)  Note:Surgical Plans: Left Total Knee Replacement  Disposition: Home with wife  PCP: Dr. Keith Rake  IV TXA  Anesthesia Issues: Woke up and sat up during his knee scope  Signed electronically by Ok Edwards, III PA-C

## 2016-04-24 NOTE — Op Note (Signed)
OPERATIVE REPORT-TOTAL KNEE ARTHROPLASTY   Pre-operative diagnosis- Osteoarthritis  Left knee(s)  Post-operative diagnosis- Osteoarthritis Left knee(s)  Procedure-  Left  Total Knee Arthroplasty  Surgeon- Dione Plover. Robson Trickey, MD  Assistant- Ardeen Jourdain, PA-C   Anesthesia- Adductor canal block and spinal  EBL-* No blood loss amount entered *   Drains Hemovac  Tourniquet time- 36 minutes @ [id number] mm Hg    Complications- None  Condition-PACU - hemodynamically stable.   Brief Clinical Note   Evan Rivas is a 66 y.o. year old male with end stage OA of his left knee with progressively worsening pain and dysfunction. He has constant pain, with activity and at rest and significant functional deficits with difficulties even with ADLs. He has had extensive non-op management including analgesics, injections of cortisone and viscosupplements, and home exercise program, but remains in significant pain with significant dysfunction. Radiographs show bone on bone arthritis medial and patellofemoral with significant varus deformity. He presents now for left Total Knee Arthroplasty.     Procedure in detail---   The patient is brought into the operating room and positioned supine on the operating table. After successful administration of   Adductor canal block and spinal,   a tourniquet is placed high on the  Left thigh(s) and the lower extremity is prepped and draped in the usual sterile fashion. Time out is performed by the operating team and then the  Left lower extremity is wrapped in Esmarch, knee flexed and the tourniquet inflated to 300 mmHg.       A midline incision is made with a ten blade through the subcutaneous tissue to the level of the extensor mechanism. A fresh blade is used to make a medial parapatellar arthrotomy. Soft tissue over the proximal medial tibia is subperiosteally elevated to the joint line with a knife and into the semimembranosus bursa with a Cobb elevator. Soft  tissue over the proximal lateral tibia is elevated with attention being paid to avoiding the patellar tendon on the tibial tubercle. The patella is everted, knee flexed 90 degrees and the ACL and PCL are removed. Findings are bone on bone medial and patellofemoral with massive global osteophytes        The drill is used to create a starting hole in the distal femur and the canal is thoroughly irrigated with sterile saline to remove the fatty contents. The 5 degree Left  valgus alignment guide is placed into the femoral canal and the distal femoral cutting block is pinned to remove 10 mm off the distal femur. Resection is made with an oscillating saw.      The tibia is subluxed forward and the menisci are removed. The extramedullary alignment guide is placed referencing proximally at the medial aspect of the tibial tubercle and distally along the second metatarsal axis and tibial crest. The block is pinned to remove 44mm off the more deficient medial  side. Resection is made with an oscillating saw. Size 7is the most appropriate size for the tibia and the proximal tibia is prepared with the modular drill and keel punch for that size.      The femoral sizing guide is placed and size 8 is most appropriate. Rotation is marked off the epicondylar axis and confirmed by creating a rectangular flexion gap at 90 degrees. The size 8 cutting block is pinned in this rotation and the anterior, posterior and chamfer cuts are made with the oscillating saw. The intercondylar block is then placed and that cut is made.  Trial size 7 tibial component, trial size 8 posterior stabilized femur and a 6  mm posterior stabilized rotating platform insert trial is placed. Full extension is achieved with excellent varus/valgus and anterior/posterior balance throughout full range of motion. The patella is everted and thickness measured to be 25  mm. Free hand resection is taken to 14 mm, a 41 template is placed, lug holes are drilled,  trial patella is placed, and it tracks normally. Osteophytes are removed off the posterior femur with the trial in place. All trials are removed and the cut bone surfaces prepared with pulsatile lavage. Cement is mixed and once ready for implantation, the size 7 tibial implant, size  8 posterior stabilized femoral component, and the size 41 patella are cemented in place and the patella is held with the clamp. The trial insert is placed and the knee held in full extension. The Exparel (20 ml mixed with 30 ml saline) is injected into the extensor mechanism, posterior capsule, medial and lateral gutters and subcutaneous tissues.  All extruded cement is removed and once the cement is hard the permanent 6 mm posterior stabilized rotating platform insert is placed into the tibial tray.      The wound is copiously irrigated with saline solution and the extensor mechanism closed over a hemovac drain with #1 V-loc suture. The tourniquet is released for a total tourniquet time of 36  minutes. Flexion against gravity is 140 degrees and the patella tracks normally. Subcutaneous tissue is closed with 2.0 vicryl and subcuticular with running 4.0 Monocryl. The incision is cleaned and dried and steri-strips and a bulky sterile dressing are applied. The limb is placed into a knee immobilizer and the patient is awakened and transported to recovery in stable condition.      Please note that a surgical assistant was a medical necessity for this procedure in order to perform it in a safe and expeditious manner. Surgical assistant was necessary to retract the ligaments and vital neurovascular structures to prevent injury to them and also necessary for proper positioning of the limb to allow for anatomic placement of the prosthesis.   Dione Plover Velvia Mehrer, MD    04/24/2016, 12:18 PM

## 2016-04-24 NOTE — Anesthesia Procedure Notes (Signed)
Spinal  Patient location during procedure: OR End time: 04/24/2016 11:13 AM Staffing Anesthesiologist: Belinda Block Resident/CRNA: Maxwell Caul Performed: resident/CRNA  Preanesthetic Checklist Completed: patient identified, site marked, surgical consent, pre-op evaluation, timeout performed, IV checked, risks and benefits discussed and monitors and equipment checked Spinal Block Patient position: sitting Prep: DuraPrep Patient monitoring: heart rate, continuous pulse ox and blood pressure Approach: midline Location: L3-4 Injection technique: single-shot Needle Needle type: Pencan  Needle gauge: 22 G Needle length: 10 cm Additional Notes LOT # [id number]. Expiration date 10-31-2017. Single shot, negative heme, negative paresthesia. Patient tolerated well. Dr Nyoka Cowden in room during entire placement.

## 2016-04-24 NOTE — Progress Notes (Signed)
Assisted Dr. Green with left, ultrasound guided, adductor canal block. Side rails up, monitors on throughout procedure. See vital signs in flow sheet. Tolerated Procedure well.  

## 2016-04-24 NOTE — Anesthesia Preprocedure Evaluation (Signed)
Anesthesia Evaluation  Patient identified by MRN, date of birth, ID band Patient awake    Reviewed: Allergy & Precautions, NPO status , Patient's Chart, lab work & pertinent test results  Airway Mallampati: II  TM Distance: >3 FB     Dental   Pulmonary pneumonia,    breath sounds clear to auscultation       Cardiovascular hypertension,  Rhythm:Regular Rate:Normal     Neuro/Psych Seizures -,     GI/Hepatic   Endo/Other  diabetes  Renal/GU      Musculoskeletal  (+) Arthritis ,   Abdominal   Peds  Hematology   Anesthesia Other Findings   Reproductive/Obstetrics                             Anesthesia Physical Anesthesia Plan  ASA: III  Anesthesia Plan: Spinal   Post-op Pain Management:  Regional for Post-op pain   Induction: Intravenous  Airway Management Planned: Simple Face Mask  Additional Equipment:   Intra-op Plan:   Post-operative Plan:   Informed Consent: I have reviewed the patients History and Physical, chart, labs and discussed the procedure including the risks, benefits and alternatives for the proposed anesthesia with the patient or authorized representative who has indicated his/her understanding and acceptance.   Dental advisory given  Plan Discussed with: CRNA and Anesthesiologist  Anesthesia Plan Comments:         Anesthesia Quick Evaluation

## 2016-04-24 NOTE — Transfer of Care (Signed)
Immediate Anesthesia Transfer of Care Note  Patient: Evan Rivas  Procedure(s) Performed: Procedure(s) with comments: LEFT TOTAL KNEE ARTHROPLASTY (Left) - Adductor Block  Patient Location: PACU  Anesthesia Type:Regional and Spinal  Level of Consciousness:  sedated, patient cooperative and responds to stimulation  Airway & Oxygen Therapy:Patient Spontanous Breathing and Patient connected to face mask oxgen  Post-op Assessment:  Report given to PACU RN and Post -op Vital signs reviewed and stable  Post vital signs:  Reviewed and stable  Last Vitals:  Vitals:   04/24/16 1054 04/24/16 1055  BP:  (!) 177/93  Pulse: 76 71  Resp: 16 13  Temp:      Complications: No apparent anesthesia complications

## 2016-04-24 NOTE — Anesthesia Postprocedure Evaluation (Signed)
Anesthesia Post Note  Patient: Evan Rivas  Procedure(s) Performed: Procedure(s) (LRB): LEFT TOTAL KNEE ARTHROPLASTY (Left)  Patient location during evaluation: PACU Anesthesia Type: Spinal Level of consciousness: awake Pain management: pain level controlled Vital Signs Assessment: post-procedure vital signs reviewed and stable Respiratory status: spontaneous breathing Cardiovascular status: stable Postop Assessment: spinal receding Anesthetic complications: no       Last Vitals:  Vitals:   04/24/16 1237 04/24/16 1245  BP: 134/66 134/65  Pulse: 84 81  Resp: (!) 8 19  Temp: 36.6 C     Last Pain:  Vitals:   04/24/16 1237  TempSrc:   PainSc: 0-No pain                 Jhoanna Heyde

## 2016-04-24 NOTE — Addendum Note (Signed)
Addendum  created 04/24/16 1416 by Maxwell Caul, CRNA   Charge Capture section accepted

## 2016-04-25 ENCOUNTER — Encounter (HOSPITAL_COMMUNITY): Payer: Self-pay | Admitting: Orthopedic Surgery

## 2016-04-25 LAB — CBC
HEMATOCRIT: 38.9 % — AB (ref 39.0–52.0)
HEMOGLOBIN: 13.3 g/dL (ref 13.0–17.0)
MCH: 31 pg (ref 26.0–34.0)
MCHC: 34.2 g/dL (ref 30.0–36.0)
MCV: 90.7 fL (ref 78.0–100.0)
Platelets: 199 10*3/uL (ref 150–400)
RBC: 4.29 MIL/uL (ref 4.22–5.81)
RDW: 13.2 % (ref 11.5–15.5)
WBC: 10.6 10*3/uL — AB (ref 4.0–10.5)

## 2016-04-25 LAB — BASIC METABOLIC PANEL WITH GFR
Anion gap: 8 (ref 5–15)
BUN: 15 mg/dL (ref 6–20)
CO2: 28 mmol/L (ref 22–32)
Calcium: 8.3 mg/dL — ABNORMAL LOW (ref 8.9–10.3)
Chloride: 104 mmol/L (ref 101–111)
Creatinine, Ser: 0.94 mg/dL (ref 0.61–1.24)
GFR calc Af Amer: >60 mL/min
GFR calc non Af Amer: >60 mL/min
Glucose, Bld: 189 mg/dL — ABNORMAL HIGH (ref 65–99)
Potassium: 3.5 mmol/L (ref 3.5–5.1)
Sodium: 140 mmol/L (ref 135–145)

## 2016-04-25 LAB — GLUCOSE, CAPILLARY
GLUCOSE-CAPILLARY: 172 mg/dL — AB (ref 65–99)
GLUCOSE-CAPILLARY: 218 mg/dL — AB (ref 65–99)
GLUCOSE-CAPILLARY: 204 mg/dL — AB (ref 65–99)

## 2016-04-25 MED ORDER — RIVAROXABAN 10 MG PO TABS: 10.0000 mg | ORAL_TABLET | Freq: Every day | ORAL | 0 refills | Status: DC

## 2016-04-25 MED ORDER — OXYCODONE HCL 5 MG PO TABS: 5.0000 mg | ORAL_TABLET | ORAL | 0 refills | Status: DC | PRN

## 2016-04-25 NOTE — Evaluation (Signed)
Occupational Therapy Evaluation Patient Details Name: Evan Rivas MRN: [id number] DOB: 22-Oct-1950 Today's Date: 04/25/2016    History of Present Illness Pt is a 66 year old male s/p L TKA   Clinical Impression   Pt was admitted for the above sx. He will benefit from one more session of OT to review shower transfer    Follow Up Recommendations  Supervision/Assistance - 24 hour    Equipment Recommendations  None recommended by OT    Recommendations for Other Services       Precautions / Restrictions Precautions Precautions: Knee Required Braces or Orthoses: Knee Immobilizer - Left Knee Immobilizer - Left: Discontinue once straight leg raise with < 10 degree lag Restrictions Weight Bearing Restrictions: No Other Position/Activity Restrictions: WBAT      Mobility Bed Mobility         Supine to sit: Supervision        Transfers   Equipment used: Rolling walker (2 wheeled)   Sit to Stand: Min assist         General transfer comment: verbal cues for UE and LE positioning, assist for steadying while bringing UEs to RW    Balance                                            ADL Overall ADL's : Needs assistance/impaired     Grooming: Oral care;Set up;Sitting   Upper Body Bathing: Set up;Sitting   Lower Body Bathing: Moderate assistance;Sit to/from stand   Upper Body Dressing : Set up;Sitting   Lower Body Dressing: Maximal assistance;Sit to/from stand   Toilet Transfer: Minimal assistance;Ambulation;Comfort height toilet;RW   Toileting- Water quality scientist and Hygiene: Min guard;Sit to/from stand         General ADL Comments: ambulated to bathroom.  pt stood to use urinal and wash peri area.  Became sweaty and hot. Sat on commode to rest then tranferred to recliner and finished ADL (cool water only) from chair.  Pt did not c/o dizziness.  educated on safety and knee precautions     Vision     Perception     Praxis       Pertinent Vitals/Pain Pain Score: 5  Pain Location: L knee Pain Descriptors / Indicators: Aching;Sore Pain Intervention(s): Limited activity within patient's tolerance;Monitored during session;Premedicated before session;Repositioned     Hand Dominance     Extremity/Trunk Assessment Upper Extremity Assessment Upper Extremity Assessment: Overall WFL for tasks assessed           Communication Communication Communication: No difficulties   Cognition Arousal/Alertness: Awake/alert Behavior During Therapy: WFL for tasks assessed/performed Overall Cognitive Status: Within Functional Limits for tasks assessed                     General Comments       Exercises       Shoulder Instructions      Home Living Family/patient expects to be discharged to:: Private residence Living Arrangements: Spouse/significant other Available Help at Discharge: Family               Bathroom Shower/Tub: Walk-in Corporate treasurer Toilet: Handicapped height     Home Equipment: Shower seat - built in   Additional Comments: comfort height commode with vanity next to it      Prior Functioning/Environment Level of Independence: Independent  OT Problem List: Decreased activity tolerance;Pain   OT Treatment/Interventions:      OT Goals(Current goals can be found in the care plan section) Acute Rehab OT Goals Patient Stated Goal: home OT Goal Formulation: With patient Time For Goal Achievement: 04/27/16 Potential to Achieve Goals: Good ADL Goals Pt Will Transfer to Toilet: with min guard assist;ambulating (comfort height) Pt Will Perform Tub/Shower Transfer: Shower transfer;ambulating;with min guard assist;shower seat  OT Frequency: Min 2X/week   Barriers to D/C:            Co-evaluation              End of Session Nurse Communication:  (IV came out; pt with sweaty episode)  Activity Tolerance:  (pt sweaty) Patient left: in  chair;with call bell/phone within reach   Time: 0851-0930 OT Time Calculation (min): 39 min Charges:  OT General Charges $OT Visit: 1 Procedure OT Evaluation $OT Eval Low Complexity: 1 Procedure OT Treatments $Self Care/Home Management : 8-22 mins G-Codes:    Daden Mahany 2016/05/05, 11:54 AM Lesle Chris, OTR/L [phone] 05/05/16

## 2016-04-25 NOTE — Discharge Summary (Signed)
Physician Discharge Summary   Patient ID: Evan Rivas MRN: [id number] DOB/AGE: [birth year] 66 y.o.  Admit date: 04/24/2016 Discharge date: 04-26-2016  Primary Diagnosis:  Osteoarthritis  Left knee(s)  Admission Diagnoses:  Past Medical History:  Diagnosis Date  . Anxiety    panic attacks  . Arthritis   . Cancer (HCC)    lip  . Depression   . Diabetes mellitus without complication ([place])    type 2  . Gout   . History of kidney stones   . Hyperlipidemia   . Hypertension   . Pneumonia    2014   Discharge Diagnoses:   Principal Problem:   OA (osteoarthritis) of knee  Estimated body mass index is 33.77 kg/m as calculated from the following:   Height as of this encounter: '6\' 2"'  (1.88 m).   Weight as of this encounter: 119.3 kg (263 lb).  Procedure:  Procedure(s) (LRB): LEFT TOTAL KNEE ARTHROPLASTY (Left)   Consults: None  HPI: Evan Rivas is a 66 y.o. year old male with end stage OA of his left knee with progressively worsening pain and dysfunction. He has constant pain, with activity and at rest and significant functional deficits with difficulties even with ADLs. He has had extensive non-op management including analgesics, injections of cortisone and viscosupplements, and home exercise program, but remains in significant pain with significant dysfunction. Radiographs show bone on bone arthritis medial and patellofemoral with significant varus deformity. He presents now for left Total Knee Arthroplasty.   Laboratory Data: Admission on 04/24/2016  Component Date Value Ref Range Status  . Glucose-Capillary 04/24/2016 167* 65 - 99 mg/dL Final  . Comment 1 04/24/2016 Notify RN   Final  . Glucose-Capillary 04/24/2016 177* 65 - 99 mg/dL Final  . WBC 04/25/2016 10.6* 4.0 - 10.5 K/uL Final  . RBC 04/25/2016 4.29  4.22 - 5.81 MIL/uL Final  . Hemoglobin 04/25/2016 13.3  13.0 - 17.0 g/dL Final  . HCT 04/25/2016 38.9* 39.0 - 52.0 % Final  . MCV 04/25/2016 90.7  78.0 -  100.0 fL Final  . MCH 04/25/2016 31.0  26.0 - 34.0 pg Final  . MCHC 04/25/2016 34.2  30.0 - 36.0 g/dL Final  . RDW 04/25/2016 13.2  11.5 - 15.5 % Final  . Platelets 04/25/2016 199  150 - 400 K/uL Final  . Sodium 04/25/2016 140  135 - 145 mmol/L Final  . Potassium 04/25/2016 3.5  3.5 - 5.1 mmol/L Final  . Chloride 04/25/2016 104  101 - 111 mmol/L Final  . CO2 04/25/2016 28  22 - 32 mmol/L Final  . Glucose, Bld 04/25/2016 189* 65 - 99 mg/dL Final  . BUN 04/25/2016 15  6 - 20 mg/dL Final  . Creatinine, Ser 04/25/2016 0.94  0.61 - 1.24 mg/dL Final  . Calcium 04/25/2016 8.3* 8.9 - 10.3 mg/dL Final  . GFR calc non Af Amer 04/25/2016 >60  >60 mL/min Final  . GFR calc Af Amer 04/25/2016 >60  >60 mL/min Final   Comment: (NOTE) The eGFR has been calculated using the CKD EPI equation. This calculation has not been validated in all clinical situations. eGFR's persistently <60 mL/min signify possible Chronic Kidney Disease.   . Anion gap 04/25/2016 8  5 - 15 Final  . Glucose-Capillary 04/24/2016 205* 65 - 99 mg/dL Final  . Glucose-Capillary 04/24/2016 214* 65 - 99 mg/dL Final  . Glucose-Capillary 04/25/2016 172* 65 - 99 mg/dL Final  Hospital Outpatient Visit on 04/14/2016  Component Date Value Ref Range Status  .  Glucose-Capillary 04/14/2016 239* 65 - 99 mg/dL Final  . aPTT 04/14/2016 26  24 - 36 seconds Final  . WBC 04/14/2016 7.5  4.0 - 10.5 K/uL Final  . RBC 04/14/2016 4.73  4.22 - 5.81 MIL/uL Final  . Hemoglobin 04/14/2016 15.0  13.0 - 17.0 g/dL Final  . HCT 04/14/2016 43.2  39.0 - 52.0 % Final  . MCV 04/14/2016 91.3  78.0 - 100.0 fL Final  . MCH 04/14/2016 31.7  26.0 - 34.0 pg Final  . MCHC 04/14/2016 34.7  30.0 - 36.0 g/dL Final  . RDW 04/14/2016 13.3  11.5 - 15.5 % Final  . Platelets 04/14/2016 207  150 - 400 K/uL Final  . Sodium 04/14/2016 140  135 - 145 mmol/L Final  . Potassium 04/14/2016 3.5  3.5 - 5.1 mmol/L Final  . Chloride 04/14/2016 104  101 - 111 mmol/L Final  . CO2  04/14/2016 28  22 - 32 mmol/L Final  . Glucose, Bld 04/14/2016 192* 65 - 99 mg/dL Final  . BUN 04/14/2016 16  6 - 20 mg/dL Final  . Creatinine, Ser 04/14/2016 0.92  0.61 - 1.24 mg/dL Final  . Calcium 04/14/2016 9.2  8.9 - 10.3 mg/dL Final  . Total Protein 04/14/2016 6.8  6.5 - 8.1 g/dL Final  . Albumin 04/14/2016 4.1  3.5 - 5.0 g/dL Final  . AST 04/14/2016 19  15 - 41 U/L Final  . ALT 04/14/2016 21  17 - 63 U/L Final  . Alkaline Phosphatase 04/14/2016 79  38 - 126 U/L Final  . Total Bilirubin 04/14/2016 0.7  0.3 - 1.2 mg/dL Final  . GFR calc non Af Amer 04/14/2016 >60  >60 mL/min Final  . GFR calc Af Amer 04/14/2016 >60  >60 mL/min Final   Comment: (NOTE) The eGFR has been calculated using the CKD EPI equation. This calculation has not been validated in all clinical situations. eGFR's persistently <60 mL/min signify possible Chronic Kidney Disease.   . Anion gap 04/14/2016 8  5 - 15 Final  . Prothrombin Time 04/14/2016 12.6  11.4 - 15.2 seconds Final  . INR 04/14/2016 0.94   Final  . ABO/RH(D) 04/14/2016 A POS   Final  . Antibody Screen 04/14/2016 NEG   Final  . Sample Expiration 04/14/2016 04/27/2016   Final  . Extend sample reason 04/14/2016 NO TRANSFUSIONS OR PREGNANCY IN THE PAST 3 MONTHS   Final  . Color, Urine 04/14/2016 YELLOW  YELLOW Final  . APPearance 04/14/2016 CLEAR  CLEAR Final  . Specific Gravity, Urine 04/14/2016 1.017  1.005 - 1.030 Final  . pH 04/14/2016 6.0  5.0 - 8.0 Final  . Glucose, UA 04/14/2016 >=500* NEGATIVE mg/dL Final  . Hgb urine dipstick 04/14/2016 NEGATIVE  NEGATIVE Final  . Bilirubin Urine 04/14/2016 NEGATIVE  NEGATIVE Final  . Ketones, ur 04/14/2016 NEGATIVE  NEGATIVE mg/dL Final  . Protein, ur 04/14/2016 NEGATIVE  NEGATIVE mg/dL Final  . Nitrite 04/14/2016 NEGATIVE  NEGATIVE Final  . Leukocytes, UA 04/14/2016 NEGATIVE  NEGATIVE Final  . RBC / HPF 04/14/2016 0-5  0 - 5 RBC/hpf Final  . WBC, UA 04/14/2016 6-30  0 - 5 WBC/hpf Final  . Bacteria, UA  04/14/2016 NONE SEEN  NONE SEEN Final  . Squamous Epithelial / LPF 04/14/2016 0-5* NONE SEEN Final  . Mucous 04/14/2016 PRESENT   Final  . Hyaline Casts, UA 04/14/2016 PRESENT   Final  . MRSA, PCR 04/14/2016 NEGATIVE  NEGATIVE Final  . Staphylococcus aureus 04/14/2016 POSITIVE* NEGATIVE Final  Comment:        The Xpert SA Assay (FDA approved for NASAL specimens in patients over 29 years of age), is one component of a comprehensive surveillance program.  Test performance has been validated by [facility] for patients greater than or equal to 32 year old. It is not intended to diagnose infection nor to guide or monitor treatment.   . ABO/RH(D) 04/14/2016 A POS   Final  Orders Only on 03/09/2016  Component Date Value Ref Range Status  . WBC 03/09/2016 7.2  3.4 - 10.8 x10E3/uL Final  . RBC 03/09/2016 4.69  4.14 - 5.80 x10E6/uL Final  . Hemoglobin 03/09/2016 15.2  13.0 - 17.7 g/dL Final  . Hematocrit 03/09/2016 43.7  37.5 - 51.0 % Final  . MCV 03/09/2016 93  79 - 97 fL Final  . MCH 03/09/2016 32.4  26.6 - 33.0 pg Final  . MCHC 03/09/2016 34.8  31.5 - 35.7 g/dL Final  . RDW 03/09/2016 14.3  12.3 - 15.4 % Final  . Platelets 03/09/2016 227  150 - 379 x10E3/uL Final  . Neutrophils 03/09/2016 70  Not Estab. % Final  . Lymphs 03/09/2016 23  Not Estab. % Final  . Monocytes 03/09/2016 4  Not Estab. % Final  . Eos 03/09/2016 3  Not Estab. % Final  . Basos 03/09/2016 0  Not Estab. % Final  . Neutrophils Absolute 03/09/2016 5.0  1.4 - 7.0 x10E3/uL Final  . Lymphocytes Absolute 03/09/2016 1.6  0.7 - 3.1 x10E3/uL Final  . Monocytes Absolute 03/09/2016 0.3  0.1 - 0.9 x10E3/uL Final  . EOS (ABSOLUTE) 03/09/2016 0.2  0.0 - 0.4 x10E3/uL Final  . Basophils Absolute 03/09/2016 0.0  0.0 - 0.2 x10E3/uL Final  . Immature Granulocytes 03/09/2016 0  Not Estab. % Final  . Immature Grans (Abs) 03/09/2016 0.0  0.0 - 0.1 x10E3/uL Final  . Glucose 03/09/2016 167* 65 - 99 mg/dL Final  . BUN 03/09/2016 12   8 - 27 mg/dL Final  . Creatinine, Ser 03/09/2016 0.99  0.76 - 1.27 mg/dL Final  . GFR calc non Af Amer 03/09/2016 80  >59 mL/min/1.73 Final  . GFR calc Af Amer 03/09/2016 92  >59 mL/min/1.73 Final  . BUN/Creatinine Ratio 03/09/2016 12  10 - 24 Final  . Sodium 03/09/2016 146* 134 - 144 mmol/L Final  . Potassium 03/09/2016 3.6  3.5 - 5.2 mmol/L Final  . Chloride 03/09/2016 103  96 - 106 mmol/L Final  . CO2 03/09/2016 26  18 - 29 mmol/L Final  . Calcium 03/09/2016 9.2  8.6 - 10.2 mg/dL Final  . Total Protein 03/09/2016 7.0  6.0 - 8.5 g/dL Final  . Albumin 03/09/2016 4.4  3.6 - 4.8 g/dL Final  . Globulin, Total 03/09/2016 2.6  1.5 - 4.5 g/dL Final  . Albumin/Globulin Ratio 03/09/2016 1.7  1.2 - 2.2 Final  . Bilirubin Total 03/09/2016 0.4  0.0 - 1.2 mg/dL Final  . Alkaline Phosphatase 03/09/2016 92  39 - 117 IU/L Final  . AST 03/09/2016 16  0 - 40 IU/L Final  . ALT 03/09/2016 21  0 - 44 IU/L Final  . Hgb A1c MFr Bld 03/09/2016 6.6* 4.8 - 5.6 % Final   Comment:          Pre-diabetes: 5.7 - 6.4          Diabetes: >6.4          Glycemic control for adults with diabetes: <7.0   Office Visit on 03/09/2016  Component Date  Value Ref Range Status  . POC Glucose 03/09/2016 174* 70 - 99 mg/dl Final     X-Rays:No results found.  EKG: Orders placed or performed in visit on 03/09/16  . EKG 12-Lead     Hospital Course: Evan Rivas is a 66 y.o. who was admitted to [facility]. They were brought to the operating room on 04/24/2016 and underwent Procedure(s): LEFT TOTAL KNEE ARTHROPLASTY.  Patient tolerated the procedure well and was later transferred to the recovery room and then to the orthopaedic floor for postoperative care.  They were given PO and IV analgesics for pain control following their surgery.  They were given 24 hours of postoperative antibiotics of  Anti-infectives    Start     Dose/Rate Route Frequency Ordered Stop   04/24/16 1730  ceFAZolin (ANCEF) IVPB 2g/100 mL  premix     2 g 200 mL/hr over 30 Minutes Intravenous Every 6 hours 04/24/16 1342 04/24/16 2329   04/24/16 0830  ceFAZolin (ANCEF) IVPB 2g/100 mL premix     2 g 200 mL/hr over 30 Minutes Intravenous On call to O.R. 04/24/16 0830 04/24/16 1115     and started on DVT prophylaxis in the form of Xarelto.   PT and OT were ordered for total joint protocol.  Discharge planning consulted to help with postop disposition and equipment needs.  Patient had a decent night on the evening of surgery.  They started to get up OOB with therapy on day one. Hemovac drain was pulled without difficulty.  Continued to work with therapy into day two.  Dressing was changed on day two and the incision was healing well. Patient was seen in rounds on POD 2 and was ready to go home.  Discharge home with home health Diet - Cardiac diet and Diabetic diet Follow up - in 2 weeks Activity - WBAT Disposition - Home Condition Upon Discharge - Stable D/C Meds - See DC Summary DVT Prophylaxis - Xarelto   Discharge Instructions    Call MD / Call 911    Complete by:  As directed    If you experience chest pain or shortness of breath, CALL 911 and be transported to the hospital emergency room.  If you develope a fever above 101 F, pus (white drainage) or increased drainage or redness at the wound, or calf pain, call your surgeon's office.   Change dressing    Complete by:  As directed    Change dressing daily with sterile 4 x 4 inch gauze dressing and apply TED hose. Do not submerge the incision under water.   Constipation Prevention    Complete by:  As directed    Drink plenty of fluids.  Prune juice may be helpful.  You may use a stool softener, such as Colace (over the counter) 100 mg twice a day.  Use MiraLax (over the counter) for constipation as needed.   Diet - low sodium heart healthy    Complete by:  As directed    Diet Carb Modified    Complete by:  As directed    Discharge instructions    Complete by:  As  directed    Pick up stool softner and laxative for home use following surgery while on pain medications. Do not submerge incision under water. Please use good hand washing techniques while changing dressing each day. May shower starting three days after surgery. Please use a clean towel to pat the incision dry following showers. Continue to use ice for  pain and swelling after surgery. Do not use any lotions or creams on the incision until instructed by your surgeon.  Wear both TED hose on both legs during the day every day for three weeks, but may have off at night at home.  Postoperative Constipation Protocol  Constipation - defined medically as fewer than three stools per week and severe constipation as less than one stool per week.  One of the most common issues patients have following surgery is constipation.  Even if you have a regular bowel pattern at home, your normal regimen is likely to be disrupted due to multiple reasons following surgery.  Combination of anesthesia, postoperative narcotics, change in appetite and fluid intake all can affect your bowels.  In order to avoid complications following surgery, here are some recommendations in order to help you during your recovery period.  Colace (docusate) - Pick up an over-the-counter form of Colace or another stool softener and take twice a day as long as you are requiring postoperative pain medications.  Take with a full glass of water daily.  If you experience loose stools or diarrhea, hold the colace until you stool forms back up.  If your symptoms do not get better within 1 week or if they get worse, check with your doctor.  Dulcolax (bisacodyl) - Pick up over-the-counter and take as directed by the product packaging as needed to assist with the movement of your bowels.  Take with a full glass of water.  Use this product as needed if not relieved by Colace only.   MiraLax (polyethylene glycol) - Pick up over-the-counter to have on  hand.  MiraLax is a solution that will increase the amount of water in your bowels to assist with bowel movements.  Take as directed and can mix with a glass of water, juice, soda, coffee, or tea.  Take if you go more than two days without a movement. Do not use MiraLax more than once per day. Call your doctor if you are still constipated or irregular after using this medication for 7 days in a row.  If you continue to have problems with postoperative constipation, please contact the office for further assistance and recommendations.  If you experience "the worst abdominal pain ever" or develop nausea or vomiting, please contact the office immediatly for further recommendations for treatment.   Take Xarelto for two and a half more weeks, then discontinue Xarelto.   Do not put a pillow under the knee. Place it under the heel.    Complete by:  As directed    Do not sit on low chairs, stoools or toilet seats, as it may be difficult to get up from low surfaces    Complete by:  As directed    Driving restrictions    Complete by:  As directed    No driving until released by the physician.   Increase activity slowly as tolerated    Complete by:  As directed    Lifting restrictions    Complete by:  As directed    No lifting until released by the physician.   Patient may shower    Complete by:  As directed    You may shower without a dressing once there is no drainage.  Do not wash over the wound.  If drainage remains, do not shower until drainage stops.   TED hose    Complete by:  As directed    Use stockings (TED hose) for 3 weeks on both leg(s).  You may remove them at night for sleeping.   Weight bearing as tolerated    Complete by:  As directed    Laterality:  left   Extremity:  Lower     Allergies as of 04/25/2016      Reactions   2,4-d Dimethylamine (amisol) Nausea And Vomiting   Pt is not aware of this   Celebrex  [celecoxib]    GI   Tetanus Toxoids Other (See Comments)   unkown    Tetanus-diphtheria Toxoids Td    Other reaction(s): Unknown      Medication List    STOP taking these medications   aspirin EC 81 MG tablet   CINNAMON PO   glucose blood test strip Commonly known as:  ACCU-CHEK AVIVA   HYDROcodone-acetaminophen 10-325 MG tablet Commonly known as:  NORCO   multivitamin with minerals Tabs tablet   nabumetone 500 MG tablet Commonly known as:  RELAFEN     TAKE these medications   allopurinol 300 MG tablet Commonly known as:  ZYLOPRIM Take 1 tablet (300 mg total) by mouth daily. What changed:  when to take this   amLODipine 10 MG tablet Commonly known as:  NORVASC Take 1 tablet (10 mg total) by mouth daily.   atorvastatin 40 MG tablet Commonly known as:  LIPITOR Take 1 tablet (40 mg total) by mouth at bedtime. At Bedtime What changed:  additional instructions   clonazePAM 1 MG tablet Commonly known as:  KLONOPIN Take 1 tablet (1 mg total) by mouth 2 (two) times daily as needed. What changed:  when to take this   FLUoxetine 40 MG capsule Commonly known as:  PROZAC Take 1 capsule (40 mg total) by mouth every morning.   fluticasone 50 MCG/ACT nasal spray Commonly known as:  FLONASE Place 2 sprays into both nostrils daily. 2 sprays What changed:  when to take this  reasons to take this  additional instructions   glipiZIDE 5 MG tablet Commonly known as:  GLUCOTROL Take 1 tablet (5 mg total) by mouth daily.   losartan 100 MG tablet Commonly known as:  COZAAR Take 1 tablet (100 mg total) by mouth daily. What changed:  when to take this   metFORMIN 500 MG 24 hr tablet Commonly known as:  GLUCOPHAGE-XR Take 2 tablets (1,000 mg total) by mouth 2 (two) times daily. 102m BID   metoprolol succinate 25 MG 24 hr tablet Commonly known as:  TOPROL-XL Take 1 tablet (25 mg total) by mouth every morning.   metoprolol succinate 100 MG 24 hr tablet Commonly known as:  TOPROL-XL Take 1 tablet (100 mg total) by mouth every  morning.   oxyCODONE 5 MG immediate release tablet Commonly known as:  Oxy IR/ROXICODONE Take 1-4 tablets (5-20 mg total) by mouth every 4 (four) hours as needed for moderate pain or severe pain.   rivaroxaban 10 MG Tabs tablet Commonly known as:  XARELTO Take 1 tablet (10 mg total) by mouth daily with breakfast. Take Xarelto for two and a half more weeks following discharge from the hospital, then discontinue Xarelto. Once the patient has completed the Xarelto, they may resume the 81 mg Aspirin. Start taking on:  04/26/2016   tamsulosin 0.4 MG Caps capsule Commonly known as:  FLOMAX Take 1 capsule (0.4 mg total) by mouth daily after supper.   tiZANidine 4 MG tablet Commonly known as:  ZANAFLEX Take 1 tablet (4 mg total) by mouth as needed. What changed:  when to take this  reasons  to take this            Durable Medical Equipment        Start     Ordered   04/24/16 1528  For home use only DME 3 n 1  Once     04/24/16 1527   04/24/16 1528  For home use only DME Walker rolling  Once    Question:  Patient needs a walker to treat with the following condition  Answer:  Arthritis of knee   04/24/16 1527     Follow-up Information    Gearlean Alf, MD. Schedule an appointment as soon as possible for a visit on 05/09/2016.   Specialty:  Orthopedic Surgery Contact information: [address] [place] [address] [phone]           Signed: Arlee Muslim, PA-C Orthopaedic Surgery 04/25/2016, 8:40 AM

## 2016-04-25 NOTE — Care Management Note (Signed)
Case Management Note  Patient Details  Name: HERLEY BERNARDINI MRN: [id number] Date of Birth: March 31, 1951  Subjective/Objective:                  LEFT TOTAL KNEE ARTHROPLASTY (Left) Action/Plan: Discharge planning Expected Discharge Date:  04/25/16               Expected Discharge Plan:  [place]  In-House Referral:     Discharge planning Services  CM Consult  Post Acute Care Choice:  Home Health Choice offered to:  Patient  DME Arranged:  Walker rolling DME Agency:  [place]:  PT [place] Agency:  [facility] ([facility])  Status of Service:  Completed, signed off  If discussed at [employer] of Stay Meetings, dates discussed:    Additional Comments: CM met with pt in room to offer choice of home health agency. Pt chooses [facility]. Referral given to [facility] rep, Tim. Cm notified [place] DME rep, brad to please deliver the rollling walker to room prior to discharge.  NO other CM needs were communicated. Dellie Catholic, RN 04/25/2016, 10:53 AM

## 2016-04-25 NOTE — Progress Notes (Signed)
Physical Therapy Treatment Patient Details Name: Evan Rivas MRN: [id number] DOB: 1950/04/16 Today's Date: 04/25/2016    History of Present Illness Pt is a 66 year old male s/p L TKA    PT Comments    POD # 1 pm session Pt feeling better.  Tolerated amb an increased distance and able to complete full PT session. Assisted back to bed and performed some TKR TE's followed by ICE.  Follow Up Recommendations  Home health PT     Equipment Recommendations  Rolling walker with 5" wheels    Recommendations for Other Services       Precautions / Restrictions Precautions Precautions: Knee Precaution Comments: applied KI and instructed on use Required Braces or Orthoses: Knee Immobilizer - Left Knee Immobilizer - Left: Discontinue once straight leg raise with < 10 degree lag Restrictions Weight Bearing Restrictions: No Other Position/Activity Restrictions: WBAT    Mobility  Bed Mobility               General bed mobility comments: OOB in recliner  Transfers Overall transfer level: Needs assistance Equipment used: Rolling walker (2 wheeled) Transfers: Sit to/from Stand Sit to Stand: Min assist         General transfer comment: verbal cues for UE and LE positioning, assist for steadying while bringing UEs to RW  Ambulation/Gait Ambulation/Gait assistance: Min assist Ambulation Distance (Feet): 74 Feet Assistive device: Rolling walker (2 wheeled) Gait Pattern/deviations: Step-to pattern;Step-through pattern;Decreased stance time - left;Antalgic Gait velocity: decreased   General Gait Details: tolerated increased distance and feeling better   Stairs            Wheelchair Mobility    Modified Rankin (Stroke Patients Only)       Balance                                    Cognition Arousal/Alertness: Awake/alert Behavior During Therapy: WFL for tasks assessed/performed Overall Cognitive Status: Within Functional Limits for tasks  assessed                      Exercises   Total Knee Replacement TE's 10 reps B LE ankle pumps 10 reps towel squeezes 10 reps knee presses 10 reps heel slides  AAROM Followed by ICE     General Comments        Pertinent Vitals/Pain Pain Assessment: 0-10 Pain Score: 3  Pain Location: L knee Pain Descriptors / Indicators: Tender;Tightness Pain Intervention(s): Monitored during session;Repositioned;Ice applied    Home Living                      Prior Function            PT Goals (current goals can now be found in the care plan section) Progress towards PT goals: Progressing toward goals    Frequency    7X/week      PT Plan Current plan remains appropriate    Co-evaluation             End of Session Equipment Utilized During Treatment: Gait belt Activity Tolerance: Patient tolerated treatment well Patient left: in chair;with call bell/phone within reach;with chair alarm set     Time: [id number] PT Time Calculation (min) (ACUTE ONLY): 28 min  Charges:  $Gait Training: 8-22 mins $Therapeutic Exercise: 8-22 mins  G Codes:      Rica Koyanagi  PTA [facility]  Acute  Rehab Pager      [phone]

## 2016-04-25 NOTE — Progress Notes (Signed)
   Subjective: 1 Day Post-Op Procedure(s) (LRB): LEFT TOTAL KNEE ARTHROPLASTY (Left) Patient reports pain as mild and moderate.   Patient seen in rounds with Dr. Wynelle Link. Patient is well, but has had some minor complaints of pain in the knee, requiring pain medications We will start therapy today.  If they do well with therapy and meets all goals, then will allow home later this afternoon following therapy. Plan is to go Home after hospital stay.  Objective: Vital signs in last 24 hours: Temp:  [97.7 F (36.5 C)-98.9 F (37.2 C)] 98.6 F (37 C) (01/23 0626) Pulse Rate:  [68-93] 88 (01/23 0626) Resp:  [8-22] 18 (01/23 0626) BP: (134-177)/(65-93) 152/84 (01/23 0626) SpO2:  [92 %-100 %] 98 % (01/23 0626) Weight:  [119.3 kg (263 lb)] 119.3 kg (263 lb) (01/22 0841)  Intake/Output from previous day:  Intake/Output Summary (Last 24 hours) at 04/25/16 0835 Last data filed at 04/25/16 0631  Gross per 24 hour  Intake             4005 ml  Output             2547 ml  Net             1458 ml    Intake/Output this shift: No intake/output data recorded.  Labs:  Recent Labs  04/25/16 0414  HGB 13.3    Recent Labs  04/25/16 0414  WBC 10.6*  RBC 4.29  HCT 38.9*  PLT 199    Recent Labs  04/25/16 0414  NA 140  K 3.5  CL 104  CO2 28  BUN 15  CREATININE 0.94  GLUCOSE 189*  CALCIUM 8.3*   No results for input(s): LABPT, INR in the last 72 hours.  EXAM General - Patient is Alert, Appropriate and Oriented Extremity - Neurovascular intact Sensation intact distally Intact pulses distally Dorsiflexion/Plantar flexion intact Dressing - dressing C/D/I Motor Function - intact, moving foot and toes well on exam.  Hemovac pulled without difficulty.  Past Medical History:  Diagnosis Date  . Anxiety    panic attacks  . Arthritis   . Cancer (HCC)    lip  . Depression   . Diabetes mellitus without complication ([place])    type 2  . Gout   . History of kidney stones   .  Hyperlipidemia   . Hypertension   . Pneumonia    2014    Assessment/Plan: 1 Day Post-Op Procedure(s) (LRB): LEFT TOTAL KNEE ARTHROPLASTY (Left) Principal Problem:   OA (osteoarthritis) of knee  Estimated body mass index is 33.77 kg/m as calculated from the following:   Height as of this encounter: 6\' 2"  (1.88 m).   Weight as of this encounter: 119.3 kg (263 lb). Up with therapy Plan for discharge tomorrow  DVT Prophylaxis - Xarelto Weight-Bearing as tolerated to left leg D/C O2 and Pulse OX and try on Room Air  If meets goals and able to go home:  Diet - Cardiac diet and Diabetic diet Follow up - in 2 weeks Activity - WBAT Disposition - Home Condition Upon Discharge - pending D/C Meds - See DC Summary DVT Prophylaxis - [place], PA-C Orthopaedic Surgery

## 2016-04-25 NOTE — Discharge Instructions (Addendum)
° °Dr. Frank Aluisio °Total Joint Specialist °[facility] °[address] °[place], [place] [address] °[phone] ° °TOTAL KNEE REPLACEMENT POSTOPERATIVE DIRECTIONS ° °Knee Rehabilitation, Guidelines Following Surgery  °Results after knee surgery are often greatly improved when you follow the exercise, range of motion and muscle strengthening exercises prescribed by your doctor. Safety measures are also important to protect the knee from further injury. Any time any of these exercises cause you to have increased pain or swelling in your knee joint, decrease the amount until you are comfortable again and slowly increase them. If you have problems or questions, call your caregiver or physical therapist for advice.  ° °HOME CARE INSTRUCTIONS  °Remove items at home which could result in a fall. This includes throw rugs or furniture in walking pathways.  °· ICE to the affected knee every three hours for 30 minutes at a time and then as needed for pain and swelling.  Continue to use ice on the knee for pain and swelling from surgery. You may notice swelling that will progress down to the foot and ankle.  This is normal after surgery.  Elevate the leg when you are not up walking on it.   °· Continue to use the breathing machine which will help keep your temperature down.  It is common for your temperature to cycle up and down following surgery, especially at night when you are not up moving around and exerting yourself.  The breathing machine keeps your lungs expanded and your temperature down. °· Do not place pillow under knee, focus on keeping the knee straight while resting ° °DIET °You may resume your previous home diet once your are discharged from the hospital. ° °DRESSING / WOUND CARE / SHOWERING °You may shower 3 days after surgery, but keep the wounds dry during showering.  You may use an occlusive plastic wrap (Press'n Seal for example), NO SOAKING/SUBMERGING IN THE BATHTUB.  If the  bandage gets wet, change with a clean dry gauze.  If the incision gets wet, pat the wound dry with a clean towel. °You may start showering once you are discharged home but do not submerge the incision under water. Just pat the incision dry and apply a dry gauze dressing on daily. °Change the surgical dressing daily and reapply a dry dressing each time. ° °ACTIVITY °Walk with your walker as instructed. °Use walker as long as suggested by your caregivers. °Avoid periods of inactivity such as sitting longer than an hour when not asleep. This helps prevent blood clots.  °You may resume a sexual relationship in one month or when given the OK by your doctor.  °You may return to work once you are cleared by your doctor.  °Do not drive a car for 6 weeks or until released by you surgeon.  °Do not drive while taking narcotics. ° °WEIGHT BEARING °Weight bearing as tolerated with assist device (walker, cane, etc) as directed, use it as long as suggested by your surgeon or therapist, typically at least 4-6 weeks. ° °POSTOPERATIVE CONSTIPATION PROTOCOL °Constipation - defined medically as fewer than three stools per week and severe constipation as less than one stool per week. ° °One of the most common issues patients have following surgery is constipation.  Even if you have a regular bowel pattern at home, your normal regimen is likely to be disrupted due to multiple reasons following surgery.  Combination of anesthesia, postoperative narcotics, change in appetite and fluid intake all can affect your bowels.    In order to avoid complications following surgery, here are some recommendations in order to help you during your recovery period. ° °Colace (docusate) - Pick up an over-the-counter form of Colace or another stool softener and take twice a day as long as you are requiring postoperative pain medications.  Take with a full glass of water daily.  If you experience loose stools or diarrhea, hold the colace until you stool forms  back up.  If your symptoms do not get better within 1 week or if they get worse, check with your doctor. ° °Dulcolax (bisacodyl) - Pick up over-the-counter and take as directed by the product packaging as needed to assist with the movement of your bowels.  Take with a full glass of water.  Use this product as needed if not relieved by Colace only.  ° °MiraLax (polyethylene glycol) - Pick up over-the-counter to have on hand.  MiraLax is a solution that will increase the amount of water in your bowels to assist with bowel movements.  Take as directed and can mix with a glass of water, juice, soda, coffee, or tea.  Take if you go more than two days without a movement. °Do not use MiraLax more than once per day. Call your doctor if you are still constipated or irregular after using this medication for 7 days in a row. ° °If you continue to have problems with postoperative constipation, please contact the office for further assistance and recommendations.  If you experience "the worst abdominal pain ever" or develop nausea or vomiting, please contact the office immediatly for further recommendations for treatment. ° °ITCHING ° If you experience itching with your medications, try taking only a single pain pill, or even half a pain pill at a time.  You can also use Benadryl over the counter for itching or also to help with sleep.  ° °TED HOSE STOCKINGS °Wear the elastic stockings on both legs for three weeks following surgery during the day but you may remove then at night for sleeping. ° °MEDICATIONS °See your medication summary on the “After Visit Summary” that the nursing staff will review with you prior to discharge.  You may have some home medications which will be placed on hold until you complete the course of blood thinner medication.  It is important for you to complete the blood thinner medication as prescribed by your surgeon.  Continue your approved medications as instructed at time of  discharge. ° °PRECAUTIONS °If you experience chest pain or shortness of breath - call 911 immediately for transfer to the hospital emergency department.  °If you develop a fever greater that 101 F, purulent drainage from wound, increased redness or drainage from wound, foul odor from the wound/dressing, or calf pain - CONTACT YOUR SURGEON.   °                                                °FOLLOW-UP APPOINTMENTS °Make sure you keep all of your appointments after your operation with your surgeon and caregivers. You should call the office at the above phone number and make an appointment for approximately two weeks after the date of your surgery or on the date instructed by your surgeon outlined in the "After Visit Summary". ° ° °RANGE OF MOTION AND STRENGTHENING EXERCISES  °Rehabilitation of the knee is important following a knee injury or   an operation. After just a few days of immobilization, the muscles of the thigh which control the knee become weakened and shrink (atrophy). Knee exercises are designed to build up the tone and strength of the thigh muscles and to improve knee motion. Often times heat used for twenty to thirty minutes before working out will loosen up your tissues and help with improving the range of motion but do not use heat for the first two weeks following surgery. These exercises can be done on a training (exercise) mat, on the floor, on a table or on a bed. Use what ever works the best and is most comfortable for you Knee exercises include:  Leg Lifts - While your knee is still immobilized in a splint or cast, you can do straight leg raises. Lift the leg to 60 degrees, hold for 3 sec, and slowly lower the leg. Repeat 10-20 times 2-3 times daily. Perform this exercise against resistance later as your knee gets better.  Quad and Hamstring Sets - Tighten up the muscle on the front of the thigh (Quad) and hold for 5-10 sec. Repeat this 10-20 times hourly. Hamstring sets are done by pushing the  foot backward against an object and holding for 5-10 sec. Repeat as with quad sets.   Leg Slides: Lying on your back, slowly slide your foot toward your buttocks, bending your knee up off the floor (only go as far as is comfortable). Then slowly slide your foot back down until your leg is flat on the floor again.  Angel Wings: Lying on your back spread your legs to the side as far apart as you can without causing discomfort.  A rehabilitation program following serious knee injuries can speed recovery and prevent re-injury in the future due to weakened muscles. Contact your doctor or a physical therapist for more information on knee rehabilitation.   IF YOU ARE TRANSFERRED TO A SKILLED REHAB FACILITY If the patient is transferred to a skilled rehab facility following release from the hospital, a list of the current medications will be sent to the facility for the patient to continue.  When discharged from the skilled rehab facility, please have the facility set up the patient's [place] prior to being released. Also, the skilled facility will be responsible for providing the patient with their medications at time of release from the facility to include their pain medication, the muscle relaxants, and their blood thinner medication. If the patient is still at the rehab facility at time of the two week follow up appointment, the skilled rehab facility will also need to assist the patient in arranging follow up appointment in our office and any transportation needs.  MAKE SURE YOU:  Understand these instructions.  Get help right away if you are not doing well or get worse.    Pick up stool softner and laxative for home use following surgery while on pain medications. Do not submerge incision under water. Please use good hand washing techniques while changing dressing each day. May shower starting three days after surgery. Please use a clean towel to pat the incision dry following  showers. Continue to use ice for pain and swelling after surgery. Do not use any lotions or creams on the incision until instructed by your surgeon.  Take Xarelto for two and a half more weeks following discharge from the hospital, then discontinue Xarelto. Once the patient has completed the Xarelto, they may resume the 81 mg Aspirin.  Information on my medicine - XARELTO (Rivaroxaban)  This medication education was reviewed with me or my healthcare representative as part of my discharge preparation.  The pharmacist that spoke with me during my hospital stay was:  [facility] Marjean Imperato, Student-PharmD  Why was Xarelto prescribed for you? Xarelto was prescribed for you to reduce the risk of blood clots forming after orthopedic surgery. The medical term for these abnormal blood clots is venous thromboembolism (VTE).  What do you need to know about xarelto ? Take your Xarelto ONCE DAILY at the same time every day. You may take it either with or without food.  If you have difficulty swallowing the tablet whole, you may crush it and mix in applesauce just prior to taking your dose.  Take Xarelto exactly as prescribed by your doctor and DO NOT stop taking Xarelto without talking to the doctor who prescribed the medication.  Stopping without other VTE prevention medication to take the place of Xarelto may increase your risk of developing a clot.  After discharge, you should have regular check-up appointments with your healthcare provider that is prescribing your Xarelto.    What do you do if you miss a dose? If you miss a dose, take it as soon as you remember on the same day then continue your regularly scheduled once daily regimen the next day. Do not take two doses of Xarelto on the same day.   Important Safety Information A possible side effect of Xarelto is bleeding. You should call your healthcare provider right away if you experience any of the following: ? Bleeding from an  injury or your nose that does not stop. ? Unusual colored urine (red or dark brown) or unusual colored stools (red or black). ? Unusual bruising for unknown reasons. ? A serious fall or if you hit your head (even if there is no bleeding).  Some medicines may interact with Xarelto and might increase your risk of bleeding while on Xarelto. To help avoid this, consult your healthcare provider or pharmacist prior to using any new prescription or non-prescription medications, including herbals, vitamins, non-steroidal anti-inflammatory drugs (NSAIDs) and supplements.  This website has more information on Xarelto: [email or web address]

## 2016-04-25 NOTE — Progress Notes (Signed)
Physical Therapy Treatment Patient Details Name: Evan Rivas MRN: [id number] DOB: 15-Nov-1950 Today's Date: 04/25/2016    History of Present Illness Pt is a 66 year old male s/p L TKA    PT Comments    POD # 1 am session Applied KI and instructed on KI use.  amb distances limited by MAX c/o feeling "queezie" and became pale in color.  Hypotensive.    Follow Up Recommendations  Home health PT     Equipment Recommendations  Rolling walker with 5" wheels    Recommendations for Other Services       Precautions / Restrictions Precautions Precautions: Knee Precaution Comments: applied KI and instructed on use Required Braces or Orthoses: Knee Immobilizer - Left Knee Immobilizer - Left: Discontinue once straight leg raise with < 10 degree lag Restrictions Weight Bearing Restrictions: No Other Position/Activity Restrictions: WBAT    Mobility  Bed Mobility               General bed mobility comments: OOB in recliner  Transfers Overall transfer level: Needs assistance Equipment used: Rolling walker (2 wheeled) Transfers: Sit to/from Stand Sit to Stand: Min assist         General transfer comment: verbal cues for UE and LE positioning, assist for steadying while bringing UEs to RW  Ambulation/Gait Ambulation/Gait assistance: Min assist Ambulation Distance (Feet): 15 Feet Assistive device: Rolling walker (2 wheeled) Gait Pattern/deviations: Step-to pattern;Step-through pattern;Decreased stance time - left;Antalgic Gait velocity: decreased   General Gait Details: verbal cues for sequence, RW positioning, step length   Stairs            Wheelchair Mobility    Modified Rankin (Stroke Patients Only)       Balance                                    Cognition Arousal/Alertness: Awake/alert Behavior During Therapy: WFL for tasks assessed/performed Overall Cognitive Status: Within Functional Limits for tasks assessed                       Exercises      General Comments        Pertinent Vitals/Pain Pain Assessment: 0-10 Pain Score: 3  Pain Location: L knee Pain Descriptors / Indicators: Tender;Tightness Pain Intervention(s): Monitored during session;Repositioned;Ice applied    Home Living                      Prior Function            PT Goals (current goals can now be found in the care plan section) Progress towards PT goals: Progressing toward goals    Frequency    7X/week      PT Plan Current plan remains appropriate    Co-evaluation             End of Session Equipment Utilized During Treatment: Gait belt Activity Tolerance: Patient tolerated treatment well Patient left: in chair;with call bell/phone within reach;with chair alarm set     Time: [id number] PT Time Calculation (min) (ACUTE ONLY): 28 min  Charges:  $Gait Training: 8-22 mins $Therapeutic Activity: 8-22 mins                    G Codes:      Evan Rivas  PTA [facility]  Acute  Rehab Pager      [phone]

## 2016-04-26 LAB — BASIC METABOLIC PANEL
ANION GAP: 11 (ref 5–15)
ANION GAP: 8 (ref 5–15)
BUN: 13 mg/dL (ref 6–20)
BUN: 14 mg/dL (ref 6–20)
CALCIUM: 8.5 mg/dL — AB (ref 8.9–10.3)
CHLORIDE: 103 mmol/L (ref 101–111)
CO2: 25 mmol/L (ref 22–32)
CO2: 27 mmol/L (ref 22–32)
CREATININE: 0.85 mg/dL (ref 0.61–1.24)
Calcium: 8.3 mg/dL — ABNORMAL LOW (ref 8.9–10.3)
Chloride: 104 mmol/L (ref 101–111)
Creatinine, Ser: 0.97 mg/dL (ref 0.61–1.24)
GFR calc non Af Amer: >60 mL/min (ref >60)
GLUCOSE: 235 mg/dL — AB (ref 65–99)
Glucose, Bld: 213 mg/dL — ABNORMAL HIGH (ref 65–99)
POTASSIUM: 2.9 mmol/L — AB (ref 3.5–5.1)
POTASSIUM: 3.4 mmol/L — AB (ref 3.5–5.1)
SODIUM: 139 mmol/L (ref 135–145)
SODIUM: 139 mmol/L (ref 135–145)

## 2016-04-26 LAB — GLUCOSE, CAPILLARY
GLUCOSE-CAPILLARY: 206 mg/dL — AB (ref 65–99)
GLUCOSE-CAPILLARY: 231 mg/dL — AB (ref 65–99)

## 2016-04-26 LAB — CBC
HCT: 36.9 % — ABNORMAL LOW (ref 39.0–52.0)
HEMOGLOBIN: 12.6 g/dL — AB (ref 13.0–17.0)
MCH: 30.9 pg (ref 26.0–34.0)
MCHC: 34.1 g/dL (ref 30.0–36.0)
MCV: 90.4 fL (ref 78.0–100.0)
Platelets: 173 10*3/uL (ref 150–400)
RBC: 4.08 MIL/uL — AB (ref 4.22–5.81)
RDW: 13.4 % (ref 11.5–15.5)
WBC: 10 10*3/uL (ref 4.0–10.5)

## 2016-04-26 MED ORDER — POTASSIUM CHLORIDE CRYS ER 20 MEQ PO TBCR
40.0000 meq | EXTENDED_RELEASE_TABLET | ORAL | Status: AC
  Administered 2016-04-26 (×3): 40 meq via ORAL
  Filled 2016-04-26 (×3): qty 2

## 2016-04-26 NOTE — Progress Notes (Signed)
Physical Therapy Treatment Patient Details Name: Evan Rivas MRN: [id number] DOB: 09-12-1950 Today's Date: 04/26/2016    History of Present Illness Pt is a 66 year old male s/p L TKA    PT Comments    POD # 2 Pt feeling better but reports poor sleep last night.  Eager to D/C to home.  Instructed spouse "hands on" all listed below.  Was given HEP handout and instructed on proper tech and use of ICE.  All mobility questions addressed.  Pt ready for D/C to home.   Follow Up Recommendations  Home health PT     Equipment Recommendations  Rolling walker with 5" wheels    Recommendations for Other Services       Precautions / Restrictions Precautions Precautions: Knee Precaution Comments: applied KI and instructed on use Required Braces or Orthoses: Knee Immobilizer - Left Knee Immobilizer - Left: Discontinue once straight leg raise with < 10 degree lag Restrictions Weight Bearing Restrictions: No Other Position/Activity Restrictions: WBAT    Mobility  Bed Mobility Overal bed mobility: Needs Assistance Bed Mobility: Supine to Sit;Sit to Supine     Supine to sit: Min guard;Min assist Sit to supine: Min guard;Min assist   General bed mobility comments: instructed spouse "hands on" proper handling tech to assist pt in/out of bed  Transfers Overall transfer level: Needs assistance Equipment used: Rolling walker (2 wheeled) Transfers: Sit to/from Stand Sit to Stand: Min guard         General transfer comment: instructed spouse "hands on" safe handling tech  Ambulation/Gait Ambulation/Gait assistance: Supervision;Min guard Ambulation Distance (Feet): 84 Feet Assistive device: Rolling walker (2 wheeled) Gait Pattern/deviations: Step-to pattern;Step-through pattern;Decreased stance time - left;Antalgic Gait velocity: decreased   General Gait Details: instructed to wear KI for increased support as pt was unable to perform SLR.  Instructed spouse "hands on" safe  handling   Stairs Stairs: Yes   Stair Management: No rails;Step to pattern;Forwards;With walker Number of Stairs: 1 General stair comments: pt has one step (threshold) to enter home.  Instructed pt and spouse on proper walker placement and sequencing.    Wheelchair Mobility    Modified Rankin (Stroke Patients Only)       Balance                                    Cognition   Behavior During Therapy: WFL for tasks assessed/performed Overall Cognitive Status: Within Functional Limits for tasks assessed                      Exercises   Total Knee Replacement TE's 10 reps B LE ankle pumps 10 reps towel squeezes 10 reps knee presses 10 reps heel slides  10 reps SAQ's 10 reps SLR's 10 reps ABD Followed by ICE     General Comments        Pertinent Vitals/Pain Pain Assessment: 0-10 Pain Score: 6  Pain Location: L knee Pain Descriptors / Indicators: Tender;Tightness;Sore Pain Intervention(s): Monitored during session;Repositioned;Ice applied    Home Living                      Prior Function            PT Goals (current goals can now be found in the care plan section) Progress towards PT goals: Progressing toward goals    Frequency    7X/week  PT Plan Current plan remains appropriate    Co-evaluation             End of Session Equipment Utilized During Treatment: Gait belt;Left knee immobilizer Activity Tolerance: Patient tolerated treatment well Patient left: in bed;with family/visitor present;with call bell/phone within reach     Time: 1024-1105 PT Time Calculation (min) (ACUTE ONLY): 41 min  Charges:  $Gait Training: 8-22 mins $Therapeutic Exercise: 8-22 mins $Therapeutic Activity: 8-22 mins                    G Codes:      Rica Koyanagi  PTA [facility]  Acute  Rehab Pager      [phone]

## 2016-04-26 NOTE — Progress Notes (Signed)
Occupational Therapy Treatment Patient Details Name: Evan Rivas MRN: [id number] DOB: [birth year] Today's Date: 04/26/2016    History of present illness Pt is a 66 year old male s/p L TKA   OT comments  All education was completed this session. Recommend 3:1  Follow Up Recommendations  Supervision/Assistance - 24 hour    Equipment Recommendations  3 in 1 bedside commode    Recommendations for Other Services      Precautions / Restrictions Precautions Precautions: Knee Precaution Comments: applied KI and instructed on use Required Braces or Orthoses: Knee Immobilizer - Left Knee Immobilizer - Left: Discontinue once straight leg raise with < 10 degree lag Restrictions Weight Bearing Restrictions: No Other Position/Activity Restrictions: WBAT       Mobility Bed Mobility Overal bed mobility: Needs Assistance Bed Mobility: Supine to Sit;Sit to Supine     Supine to sit: Min assist Sit to supine: Min assist   General bed mobility comments: for RLE  Transfers Overall transfer level: Needs assistance Equipment used: Rolling walker (2 wheeled) Transfers: Sit to/from Stand Sit to Stand: Min guard         General transfer comment: cues for hand placement.  Wife present and observed    Balance                                   ADL                           Toilet Transfer: Min guard;Ambulation;RW;Comfort height toilet;Grab bars (and rail from 3:1)   Toileting- Water quality scientist and Hygiene: Min guard;Sit to/from stand   Tub/ Shower Transfer: Walk-in shower;Min guard;Ambulation;3 in 1;Rolling walker     General ADL Comments: performed bathroom transfers at min guard level.  Pt used grab bar and rail from 3:1.  He would benefit from 3:1 over commode and to use as a shower chair.  Spoke to [employer].  Educated wife to wipe legs off after pt showers so that pins do not rust.  Educated on knee precautions.  Pt plans to sit on couch recliner,  which is a little low.  Reminded him to scoot forward before getting up and also to make sure that knee is not bent (and to place pillow under calf if it is)      Vision                     Perception     Praxis      Cognition   Behavior During Therapy: [facility] for tasks assessed/performed Overall Cognitive Status: Within Functional Limits for tasks assessed                       Extremity/Trunk Assessment               Exercises     Shoulder Instructions       General Comments      Pertinent Vitals/ Pain       Pain Assessment: 0-10 Pain Score: 5  Pain Location: L knee Pain Descriptors / Indicators: Sore;Tightness Pain Intervention(s): Monitored during session;Limited activity within patient's tolerance;Premedicated before session;Repositioned;Ice applied  Home Living  Prior Functioning/Environment              Frequency           Progress Toward Goals  OT Goals(current goals can now be found in the care plan section)  Progress towards OT goals: Goals met/education completed, patient discharged from OT  Acute Rehab OT Goals Patient Stated Goal: home  Plan      Co-evaluation                 End of Session     Activity Tolerance Patient tolerated treatment well   Patient Left in bed;with call bell/phone within reach;with family/visitor present   Nurse Communication          Time: 1423-1445 OT Time Calculation (min): 22 min  Charges: OT General Charges $OT Visit: 1 Procedure OT Treatments $Self Care/Home Management : 8-22 mins  SPENCER,MARYELLEN 04/26/2016, 3:05 PM Maryellen Spencer, OTR/L [phone] 04/26/2016   

## 2016-04-26 NOTE — Progress Notes (Signed)
   Subjective: 2 Days Post-Op Procedure(s) (LRB): LEFT TOTAL KNEE ARTHROPLASTY (Left) Patient reports pain as mild.   Patient seen in rounds for Dr. Wynelle Link.  K+ low this morning.  Given supplement.  Recheck BMET today. Patient is well, but has had some minor complaints of pain in the knee, requiring pain medications Patient is ready to go home  Objective: Vital signs in last 24 hours: Temp:  [98.4 F (36.9 C)-100.3 F (37.9 C)] 100 F (37.8 C) (01/24 0618) Pulse Rate:  [72-95] 87 (01/24 0618) BP: (134-178)/(68-82) 165/78 (01/24 0618) SpO2:  [93 %-97 %] 95 % (01/24 0618)  Intake/Output from previous day:  Intake/Output Summary (Last 24 hours) at 04/26/16 1329 Last data filed at 04/26/16 1010  Gross per 24 hour  Intake              660 ml  Output             1050 ml  Net             -390 ml    Intake/Output this shift: Total I/O In: 240 [P.O.:240] Out: 500 [Urine:500]  Labs:  Recent Labs  04/25/16 0414 04/26/16 0415  HGB 13.3 12.6*    Recent Labs  04/25/16 0414 04/26/16 0415  WBC 10.6* 10.0  RBC 4.29 4.08*  HCT 38.9* 36.9*  PLT 199 173    Recent Labs  04/25/16 0414 04/26/16 0415  NA 140 139  K 3.5 2.9*  CL 104 103  CO2 28 25  BUN 15 13  CREATININE 0.94 0.85  GLUCOSE 189* 213*  CALCIUM 8.3* 8.3*   No results for input(s): LABPT, INR in the last 72 hours.  EXAM: General - Patient is Alert, Appropriate and Oriented Extremity - Neurovascular intact Sensation intact distally Incision - clean, dry Motor Function - intact, moving foot and toes well on exam.   Assessment/Plan: 2 Days Post-Op Procedure(s) (LRB): LEFT TOTAL KNEE ARTHROPLASTY (Left) Procedure(s) (LRB): LEFT TOTAL KNEE ARTHROPLASTY (Left) Past Medical History:  Diagnosis Date  . Anxiety    panic attacks  . Arthritis   . Cancer (HCC)    lip  . Depression   . Diabetes mellitus without complication ([place])    type 2  . Gout   . History of kidney stones   . Hyperlipidemia   .  Hypertension   . Pneumonia    2014   Principal Problem:   OA (osteoarthritis) of knee  Estimated body mass index is 33.77 kg/m as calculated from the following:   Height as of this encounter: 6\' 2"  (1.88 m).   Weight as of this encounter: 119.3 kg (263 lb). Up with therapy Discharge home with home health Diet - Cardiac diet and Diabetic diet Follow up - in 2 weeks Activity - WBAT Disposition - Home Condition Upon Discharge - Stable D/C Meds - See DC Summary DVT Prophylaxis - Xarelto  Arlee Muslim, PA-C Orthopaedic Surgery 04/26/2016, 1:29 PM

## 2016-04-26 NOTE — Progress Notes (Signed)
RN reviewed discharge instructions with patient and family. All questions answered.   Paperwork and prescriptions given.   NT rolled patient down with all belongings to family car. 

## 2016-04-27 DIAGNOSIS — M1711 Unilateral primary osteoarthritis, right knee: Secondary | ICD-10-CM | POA: Diagnosis not present

## 2016-04-27 DIAGNOSIS — M7061 Trochanteric bursitis, right hip: Secondary | ICD-10-CM | POA: Diagnosis not present

## 2016-04-27 DIAGNOSIS — I10 Essential (primary) hypertension: Secondary | ICD-10-CM | POA: Diagnosis not present

## 2016-04-27 DIAGNOSIS — Z471 Aftercare following joint replacement surgery: Secondary | ICD-10-CM | POA: Diagnosis not present

## 2016-04-27 DIAGNOSIS — M1710 Unilateral primary osteoarthritis, unspecified knee: Secondary | ICD-10-CM | POA: Diagnosis not present

## 2016-04-27 DIAGNOSIS — M109 Gout, unspecified: Secondary | ICD-10-CM | POA: Diagnosis not present

## 2016-04-27 DIAGNOSIS — E119 Type 2 diabetes mellitus without complications: Secondary | ICD-10-CM | POA: Diagnosis not present

## 2016-04-28 DIAGNOSIS — M7061 Trochanteric bursitis, right hip: Secondary | ICD-10-CM | POA: Diagnosis not present

## 2016-04-28 DIAGNOSIS — M109 Gout, unspecified: Secondary | ICD-10-CM | POA: Diagnosis not present

## 2016-04-28 DIAGNOSIS — I10 Essential (primary) hypertension: Secondary | ICD-10-CM | POA: Diagnosis not present

## 2016-04-28 DIAGNOSIS — E119 Type 2 diabetes mellitus without complications: Secondary | ICD-10-CM | POA: Diagnosis not present

## 2016-04-28 DIAGNOSIS — M1711 Unilateral primary osteoarthritis, right knee: Secondary | ICD-10-CM | POA: Diagnosis not present

## 2016-04-28 DIAGNOSIS — Z471 Aftercare following joint replacement surgery: Secondary | ICD-10-CM | POA: Diagnosis not present

## 2016-05-01 DIAGNOSIS — E119 Type 2 diabetes mellitus without complications: Secondary | ICD-10-CM | POA: Diagnosis not present

## 2016-05-01 DIAGNOSIS — Z471 Aftercare following joint replacement surgery: Secondary | ICD-10-CM | POA: Diagnosis not present

## 2016-05-01 DIAGNOSIS — I10 Essential (primary) hypertension: Secondary | ICD-10-CM | POA: Diagnosis not present

## 2016-05-01 DIAGNOSIS — M109 Gout, unspecified: Secondary | ICD-10-CM | POA: Diagnosis not present

## 2016-05-01 DIAGNOSIS — M7061 Trochanteric bursitis, right hip: Secondary | ICD-10-CM | POA: Diagnosis not present

## 2016-05-01 DIAGNOSIS — M1711 Unilateral primary osteoarthritis, right knee: Secondary | ICD-10-CM | POA: Diagnosis not present

## 2016-05-03 DIAGNOSIS — M109 Gout, unspecified: Secondary | ICD-10-CM | POA: Diagnosis not present

## 2016-05-03 DIAGNOSIS — E119 Type 2 diabetes mellitus without complications: Secondary | ICD-10-CM | POA: Diagnosis not present

## 2016-05-03 DIAGNOSIS — Z471 Aftercare following joint replacement surgery: Secondary | ICD-10-CM | POA: Diagnosis not present

## 2016-05-03 DIAGNOSIS — M7061 Trochanteric bursitis, right hip: Secondary | ICD-10-CM | POA: Diagnosis not present

## 2016-05-03 DIAGNOSIS — I10 Essential (primary) hypertension: Secondary | ICD-10-CM | POA: Diagnosis not present

## 2016-05-03 DIAGNOSIS — M1711 Unilateral primary osteoarthritis, right knee: Secondary | ICD-10-CM | POA: Diagnosis not present

## 2016-05-05 DIAGNOSIS — E119 Type 2 diabetes mellitus without complications: Secondary | ICD-10-CM | POA: Diagnosis not present

## 2016-05-05 DIAGNOSIS — I10 Essential (primary) hypertension: Secondary | ICD-10-CM | POA: Diagnosis not present

## 2016-05-05 DIAGNOSIS — M109 Gout, unspecified: Secondary | ICD-10-CM | POA: Diagnosis not present

## 2016-05-05 DIAGNOSIS — M1711 Unilateral primary osteoarthritis, right knee: Secondary | ICD-10-CM | POA: Diagnosis not present

## 2016-05-05 DIAGNOSIS — Z471 Aftercare following joint replacement surgery: Secondary | ICD-10-CM | POA: Diagnosis not present

## 2016-05-05 DIAGNOSIS — M7061 Trochanteric bursitis, right hip: Secondary | ICD-10-CM | POA: Diagnosis not present

## 2016-05-08 DIAGNOSIS — I10 Essential (primary) hypertension: Secondary | ICD-10-CM | POA: Diagnosis not present

## 2016-05-08 DIAGNOSIS — E119 Type 2 diabetes mellitus without complications: Secondary | ICD-10-CM | POA: Diagnosis not present

## 2016-05-08 DIAGNOSIS — M1711 Unilateral primary osteoarthritis, right knee: Secondary | ICD-10-CM | POA: Diagnosis not present

## 2016-05-08 DIAGNOSIS — M109 Gout, unspecified: Secondary | ICD-10-CM | POA: Diagnosis not present

## 2016-05-08 DIAGNOSIS — M7061 Trochanteric bursitis, right hip: Secondary | ICD-10-CM | POA: Diagnosis not present

## 2016-05-08 DIAGNOSIS — Z471 Aftercare following joint replacement surgery: Secondary | ICD-10-CM | POA: Diagnosis not present

## 2016-05-11 ENCOUNTER — Telehealth: Payer: Self-pay | Admitting: Family Medicine

## 2016-05-11 DIAGNOSIS — M1711 Unilateral primary osteoarthritis, right knee: Secondary | ICD-10-CM | POA: Diagnosis not present

## 2016-05-11 DIAGNOSIS — M109 Gout, unspecified: Secondary | ICD-10-CM | POA: Diagnosis not present

## 2016-05-11 DIAGNOSIS — E119 Type 2 diabetes mellitus without complications: Secondary | ICD-10-CM | POA: Diagnosis not present

## 2016-05-11 DIAGNOSIS — M7061 Trochanteric bursitis, right hip: Secondary | ICD-10-CM | POA: Diagnosis not present

## 2016-05-11 DIAGNOSIS — Z471 Aftercare following joint replacement surgery: Secondary | ICD-10-CM | POA: Diagnosis not present

## 2016-05-11 DIAGNOSIS — I10 Essential (primary) hypertension: Secondary | ICD-10-CM | POA: Diagnosis not present

## 2016-05-11 NOTE — Telephone Encounter (Signed)
Patient is requesting a refill on hydrocodone 10-325mg . Please call patient

## 2016-05-12 DIAGNOSIS — I10 Essential (primary) hypertension: Secondary | ICD-10-CM | POA: Diagnosis not present

## 2016-05-12 DIAGNOSIS — M7061 Trochanteric bursitis, right hip: Secondary | ICD-10-CM | POA: Diagnosis not present

## 2016-05-12 DIAGNOSIS — M1711 Unilateral primary osteoarthritis, right knee: Secondary | ICD-10-CM | POA: Diagnosis not present

## 2016-05-12 DIAGNOSIS — M109 Gout, unspecified: Secondary | ICD-10-CM | POA: Diagnosis not present

## 2016-05-12 DIAGNOSIS — E119 Type 2 diabetes mellitus without complications: Secondary | ICD-10-CM | POA: Diagnosis not present

## 2016-05-12 DIAGNOSIS — Z471 Aftercare following joint replacement surgery: Secondary | ICD-10-CM | POA: Diagnosis not present

## 2016-05-12 NOTE — Telephone Encounter (Signed)
Routed to Dr. Shah for medication refill approval 

## 2016-05-12 NOTE — Telephone Encounter (Signed)
Returned call to patient and notified him that per Dr. Manuella Ghazi he needs to schedule hospital surgery follow and they will discuss and review medications and that will determine refills, pt states he will call back at a later time to schedule appointment

## 2016-05-12 NOTE — Telephone Encounter (Signed)
He will need an office visit for hospital follow-up after surgery. We'll review medications at that time and prescribe appropriate refills.

## 2016-05-15 ENCOUNTER — Ambulatory Visit: Payer: Medicare PPO | Attending: Orthopedic Surgery

## 2016-05-15 VITALS — BP 164/86 | HR 82

## 2016-05-15 DIAGNOSIS — M6281 Muscle weakness (generalized): Secondary | ICD-10-CM | POA: Insufficient documentation

## 2016-05-15 DIAGNOSIS — R262 Difficulty in walking, not elsewhere classified: Secondary | ICD-10-CM | POA: Insufficient documentation

## 2016-05-15 DIAGNOSIS — M25562 Pain in left knee: Secondary | ICD-10-CM

## 2016-05-15 NOTE — Therapy (Signed)
[facility] [address] [place], [address] Phone: [phone]   Fax:  [phone]  Physical Therapy Evaluation  Patient Details  Name: Evan Rivas MRN: [id number] Date of Birth: 03-09-1951 Referring Provider: Gaynelle Arabian, MD  Encounter Date: 05/15/2016      PT End of Session - 05/15/16 0803    Visit Number 1   Number of Visits 25   Date for PT Re-Evaluation 07/13/16   Authorization Type 1   Authorization Time Period of  10 g code   PT Start Time 0804   PT Stop Time [id number]   PT Time Calculation (min) 43 min   Equipment Utilized During Treatment --  pt rw   Activity Tolerance Patient tolerated treatment well   Behavior During Therapy [facility] for tasks assessed/performed      Past Medical History:  Diagnosis Date  . Anxiety    panic attacks  . Arthritis   . Cancer (HCC)    lip  . Depression   . Diabetes mellitus without complication ([place])    type 2  . Gout   . History of kidney stones   . Hyperlipidemia   . Hypertension   . Pneumonia    2014    Past Surgical History:  Procedure Laterality Date  . ARTHROSCOPIC REPAIR ACL Left   . CYSTOSCOPY W/ URETERAL STENT PLACEMENT Right 09/07/2014   Procedure: CYSTOSCOPY WITH STENT REPLACEMENT;  Surgeon: Hollice Espy, MD;  Location: [facility] ORS;  Service: Urology;  Laterality: Right;  . CYSTOSCOPY/URETEROSCOPY/HOLMIUM LASER/STENT PLACEMENT Right 08/17/2014   Procedure: CYSTOSCOPY/URETEROSCOPY//STENT PLACEMENT/ attempt of lithotripsy;  Surgeon: Hollice Espy, MD;  Location: [facility] ORS;  Service: Urology;  Laterality: Right;  . HERNIA REPAIR    . MOHS SURGERY     lip  . TOTAL KNEE ARTHROPLASTY Left 04/24/2016   Procedure: LEFT TOTAL KNEE ARTHROPLASTY;  Surgeon: Gaynelle Arabian, MD;  Location: [facility];  Service: Orthopedics;  Laterality: Left;  Adductor Block  . URETEROSCOPY WITH HOLMIUM LASER LITHOTRIPSY Right 09/07/2014   Procedure: URETEROSCOPY WITH HOLMIUM  LASER LITHOTRIPSY;  Surgeon: Hollice Espy, MD;  Location: [facility] OR[facility];  Service: Urology;  Laterality: Right;  [name] VASECTOMY      Vitals:   05/15/16 0809  BP: (!) 164/86  Pulse: 82         Subjective Assessment - 05/15/16 0811    Subjective L knee pain: 3/10 (took pain medication earlier this morning), 8/10 at worst   Pertinent History S/P L TKA 04/24/2016 secondary to L knee pain and arthritis. Pt states his knee was bone on bone. Has been dealing with L knee pain for a long time. Went to rehab around 1995 which helped as much as it could. Currently having R knee pain as well and plans to get it replaced sometime this year. Had 7 sessions of home health PT which involved knee extension and flexion stretches, supine and standing hip abduction, standing heel raises. Uses a stool to get into and out of bed (high bed at home).  Prior to his procedure, pt was walking independently but with a lot of pain.    Patient Stated Goals Be able to walk the best I can, play a little golf.    Currently in Pain? Yes   Pain Score 3    Pain Location Knee   Pain Orientation Left   Pain Descriptors / Indicators Aching;Sharp;Stabbing  stabbing pain R knee from arthritis   Pain Type Surgical pain  Pain Onset 1 to 4 weeks ago  surgical pain   Pain Frequency Constant   Aggravating Factors  getting into the car, bending his knee, straightening his knee, walking   Pain Relieving Factors pain medication, rest, ice    Pt adds difficulty with stair negotiation prior to procedure        [facility] PT Assessment - 05/15/16 0805      Assessment   Medical Diagnosis L Total Knee Arthroplasty   Referring Provider Gaynelle Arabian, MD   Onset Date/Surgical Date 04/24/16   Next MD Visit 05/30/2016   Prior Therapy Had home health PT      Precautions   Precaution Comments blood pressure      Restrictions   Other Position/Activity Restrictions WBAT     Balance Screen   Has the patient fallen in the past 6 months No    Has the patient had a decrease in activity level because of a fear of falling?  No   Is the patient reluctant to leave their home because of a fear of falling?  No     Home Environment   Additional Comments Patient lives in a 1 story home with his wife, no steps.      Prior Function   Vocation Retired   Biomedical scientist PLOF: able to ambulate independenly but with a lot of knee pain.      Observation/Other Assessments   Observations L knee swelling, surgical incision healing satisfactorily   Lower Extremity Functional Scale  11/80     AROM   Right Knee Extension -16   Right Knee Flexion 116   Left Knee Extension -19   Left Knee Flexion 71     PROM   Left Knee Extension -19   Left Knee Flexion 76     Strength   Overall Strength Comments L knee flexion and extension strength not yet assessed to allow for more healing.      Palpation   Palpation comment decreased soft tissue mobility around surgical incision, decreased patellar mobility all directions     Ambulation/Gait   Gait Comments ambulates with rw, decreased L knee extension during stance phase, forward flexed, decreased gait speed      Objectives  There-ex  Directed patient with seated L knee flexion and extension AAROM using soccer ball 5x5 seconds for 2 sets for each direction.   Reviewed and given as part of his HEP. Pt demonstrated and verbalized understanding.    Improved exercise technique, movement at target joints, use of target muscles after mod verbal, visual, tactile cues.                      PT Education - 05/15/16 1958    Education provided Yes   Education Details ther-ex, HEP, plan of care   Person(s) Educated Patient   Methods Explanation;Demonstration;Tactile cues;Verbal cues;Handout   Comprehension Verbalized understanding;Returned demonstration             PT Long Term Goals - 05/15/16 1927      PT LONG TERM GOAL #1   Title Patient will improve L knee  extension seated AROM to at least -5 degrees to promote ability to ambulate and perform standing tasks.    Baseline Seated L knee extension AROM -19 degrees (05/15/2016)   Time 8   Period Weeks   Status New     PT LONG TERM GOAL #2   Title Patient will improve L knee flexion seated AROM to at  least 115 degrees to promote ability to ambulate, get into and out of cars, negotiate stairs, perform standing tassks.    Baseline Seated L knee flexion AROM 71 degrees (05/15/2016)   Time 8   Period Weeks   Status New     PT LONG TERM GOAL #3   Title Patient will improve his LEFS score by at least 9 points as a demonstration of improved function.    Baseline 11/80 (05/15/2016)   Time 8   Period Weeks   Status New     PT LONG TERM GOAL #4   Title Patient will be able to ambulate at least 500 ft without AD to promote mobility.    Baseline Pt currently ambulates with rw.    Time 8   Period Weeks   Status New     PT LONG TERM GOAL #5   Title Patient will have 5/5 L knee extension strength and at least 4+/5 L knee flexion strength to promote ability to ambulate as well as to perform functional tasks.    Baseline L knee flexion and extension strength not yet tested to allow for more healing since pt is only 3 weeks post op.    Time 8   Period Weeks   Status New               Plan - 05/15/16 1922    Clinical Impression Statement Patient is a 66 year old male who came to physical therapy S/P L TKA on 04/24/2016 secondary to knee pain. He also presents with limited L knee flexion and extension AROM, weakness, altered gait pattern and posture, L knee swelling, decreased soft tissue mobility around knee joint, and difficulty performing functional tasks such as walking, getting into and out of a car, and stair negotiation. Patient will benefit from skilled physical therapy services to address the aforementioned deficits.    Rehab Potential Good   Clinical Impairments Affecting Rehab Potential  pain, swelling, healing time   PT Frequency 2x / week  2-3x/week   PT Duration 8 weeks   PT Treatment/Interventions Manual techniques;Therapeutic exercise;Therapeutic activities;Aquatic Therapy;Electrical Stimulation;Iontophoresis 4mg /ml Dexamethasone;Gait training;Stair training;Neuromuscular re-education;Patient/family education;Dry needling   PT Next Visit Plan ROM, manual techniques PRN, gait, gentle strengthening when appropriate   Consulted and Agree with Plan of Care Patient      Patient will benefit from skilled therapeutic intervention in order to improve the following deficits and impairments:  Pain, Decreased balance, Decreased range of motion, Decreased strength, Difficulty walking, Hypomobility, Improper body mechanics, Postural dysfunction  Visit Diagnosis: Left knee pain, unspecified chronicity - Plan: PT plan of care cert/re-cert  Difficulty in walking, not elsewhere classified - Plan: PT plan of care cert/re-cert  Muscle weakness (generalized) - Plan: PT plan of care cert/re-cert      G-Codes - [id number]    Functional Assessment Tool Used LEFS, clinical presentation, patient interview   Functional Limitation Mobility: Walking and moving around   Mobility: Walking and Moving Around Current Status [phone]) At least 80 percent but less than 100 percent impaired, limited or restricted   Mobility: Walking and Moving Around Goal Status [phone]) At least 20 percent but less than 40 percent impaired, limited or restricted       Problem List Patient Active Problem List   Diagnosis Date Noted  . BPH without urinary obstruction 06/03/2015  . Depression 03/05/2015  . Hyperlipidemia 09/03/2014  . Major depressive disorder, recurrent episode, moderate ([place]) 09/03/2014  . Chronic low back pain 09/03/2014  .  Arthralgia of both knees 09/03/2014  . Type 2 diabetes mellitus without complication ([place]) [id number]  . Allergic rhinitis 07/04/2014  . Anxiety 07/04/2014  .  Continuous opioid dependence ([place]) 07/04/2014  . DDD (degenerative disc disease), lumbar 07/04/2014  . Gout of foot 07/04/2014  . BP (high blood pressure) 07/04/2014  . Flu vaccine need 07/04/2014  . Adiposity 07/04/2014  . OA (osteoarthritis) of knee 07/04/2014  . Neuralgia neuritis, sciatic nerve 07/04/2014  . Arthralgia of multiple joints 07/04/2014  . H/O malignant neoplasm of skin 12/24/2013   Joneen Boers PT, DPT   05/15/2016, 8:05 PM  [facility] [address], [place], [address] Phone: [phone]   Fax:  [phone]  Name: Evan Rivas MRN: [id number] Date of Birth: 1950-06-10

## 2016-05-15 NOTE — Telephone Encounter (Signed)
Pt called back stating he just had surgery and does not feel he should come to the office with the flu outbreak. Pt has an appt on 06/07/16 and is requesting a call from Dr Manuella Ghazi.

## 2016-05-15 NOTE — Patient Instructions (Signed)
  Sitting on your bed, gently roll the basketball to bend and straighten your knee comfortably.    Hold for 5 seconds each direction.    Do not let your knee "wobble."   Repeat 10 times.    Perform at least 3 sets daily.

## 2016-05-17 NOTE — Telephone Encounter (Signed)
Patient had been taking hydrocodone-acetaminophen 10-325 mg, which was discontinued upon discharge after his knee replacement. Instead, he was started on oxycodone 5 mg every 4 hours as needed by orthopedics. I tried to contact the patient and left a voice message. If he cannot come in for an office visit with [place], he should continue to follow up with orthopedics for opioid management as his opioid prescription has now changed and is being managed by them.

## 2016-05-18 ENCOUNTER — Ambulatory Visit: Payer: Medicare PPO

## 2016-05-18 ENCOUNTER — Telehealth: Payer: Self-pay | Admitting: Family Medicine

## 2016-05-18 DIAGNOSIS — M25562 Pain in left knee: Secondary | ICD-10-CM

## 2016-05-18 DIAGNOSIS — R262 Difficulty in walking, not elsewhere classified: Secondary | ICD-10-CM

## 2016-05-18 DIAGNOSIS — M6281 Muscle weakness (generalized): Secondary | ICD-10-CM

## 2016-05-18 DIAGNOSIS — M170 Bilateral primary osteoarthritis of knee: Secondary | ICD-10-CM

## 2016-05-18 MED ORDER — HYDROCODONE-ACETAMINOPHEN 10-325 MG PO TABS: 1.0000 | ORAL_TABLET | Freq: Three times a day (TID) | ORAL | 0 refills | Status: DC | PRN

## 2016-05-18 NOTE — Telephone Encounter (Signed)
PT SAID THAT YOU CALLED LAST NITE WHILE HE WAS HAVING DINNER AND HE DID NOT [place] PHONE. PLEASE CALL BACK. THE PT HAS A PHYSICAL THERAPY APPT TODAY FROM 12:30 TO 2PM. PT HAS JUST HAD SURGERY AND IS TRYING TO PREVENT COMING IN AND CATCHING SOMETHING. HE HAS INCISSIONS THAT ARE NOT HEALED YET. HE HAS ALWAYS KEPT HIS APPTS AND CAME IN WHEN YOU HAVE ASKED HIM TO , BUT JUST DOES NOT WANT TO HAVE A SET BACK. PT SAID THAT HE IS JUST NEEDING TO GET A REFILL ON HIS PAIN MEDICATION. THE ONE WHO DID HIS SURGERY TOOK HIM OFF THE PAIN MEDICATION AND TOLD HIM TO GO BACK ON WHAT HIS PRIMARY CARE HAS PLACED HIM ON. HE NEEDS A REFILL.

## 2016-05-18 NOTE — Telephone Encounter (Signed)
Patient has discontinued oxycodone prescribed in the immediate postop period, at his follow-up visit, the orthopedic surgeon recommended that he should go back to hydrocodone as he was taking before. Prescription for hydrocodone acetaminophen 10-325 mg one tablet every 8 hours as needed and is ready for pickup.

## 2016-05-18 NOTE — Therapy (Signed)
[facility] [facility] [address] [place], [address] Phone: [phone]   Fax:  [phone]  Physical Therapy Treatment  Patient Details  Name: Evan Rivas MRN: [id number] Date of Birth: [birth year] Referring Provider: Gaynelle Arabian, MD  Encounter Date: 05/18/2016      PT End of Session - 05/18/16 1258    Visit Number 2   Number of Visits 25   Date for PT Re-Evaluation 07/13/16   Authorization Type 2   Authorization Time Period of  10 g code   PT Start Time 1258   PT Stop Time 1340   PT Time Calculation (min) 42 min   Equipment Utilized During Treatment --  pt rw   Activity Tolerance Patient tolerated treatment well   Behavior During Therapy [facility] for tasks assessed/performed      Past Medical History:  Diagnosis Date  . Anxiety    panic attacks  . Arthritis   . Cancer (HCC)    lip  . Depression   . Diabetes mellitus without complication ([place])    type 2  . Gout   . History of kidney stones   . Hyperlipidemia   . Hypertension   . Pneumonia    2014    Past Surgical History:  Procedure Laterality Date  . ARTHROSCOPIC REPAIR ACL Left   . CYSTOSCOPY W/ URETERAL STENT PLACEMENT Right 09/07/2014   Procedure: CYSTOSCOPY WITH STENT REPLACEMENT;  Surgeon: Hollice Espy, MD;  Location: [facility];  Service: Urology;  Laterality: Right;  . CYSTOSCOPY/URETEROSCOPY/HOLMIUM LASER/STENT PLACEMENT Right 08/17/2014   Procedure: CYSTOSCOPY/URETEROSCOPY//STENT PLACEMENT/ attempt of lithotripsy;  Surgeon: Hollice Espy, MD;  Location: [facility];  Service: Urology;  Laterality: Right;  . HERNIA REPAIR    . MOHS SURGERY     lip  . TOTAL KNEE ARTHROPLASTY Left 04/24/2016   Procedure: LEFT TOTAL KNEE ARTHROPLASTY;  Surgeon: Gaynelle Arabian, MD;  Location: [facility];  Service: Orthopedics;  Laterality: Left;  Adductor Block  . URETEROSCOPY WITH HOLMIUM LASER LITHOTRIPSY Right 09/07/2014   Procedure: URETEROSCOPY WITH HOLMIUM  LASER LITHOTRIPSY;  Surgeon: Hollice Espy, MD;  Location: [facility];  Service: Urology;  Laterality: Right;  [name] VASECTOMY      There were no vitals filed for this visit.      Subjective Assessment - 05/18/16 1308    Subjective L knee feels pretty good. Not really having pain right now.    Pertinent History S/P L TKA 04/24/2016 secondary to L knee pain and arthritis. Pt states his knee was bone on bone. Has been dealing with L knee pain for a long time. Went to rehab around 1995 which helped as much as it could. Currently having R knee pain as well and plans to get it replaced sometime this year. Had 7 sessions of home health PT which involved knee extension and flexion stretches, supine and standing hip abduction, standing heel raises. Uses a stool to get into and out of bed (high bed at home).  Prior to his procedure, pt was walking independently but with a lot of pain.    Patient Stated Goals Be able to walk the best I can, play a little golf.    Currently in Pain? Other (Comment)   Pain Score --  not really having pain   Pain Onset 1 to 4 weeks ago  surgical pain  PT Education - 05/18/16 1316    Education provided Yes   Education Details ther-ex   [employer]) Educated Patient   Methods Explanation;Tactile cues;Demonstration;Verbal cues   Comprehension Returned demonstration;Verbalized understanding        Objectives  S/P 3 weeks   There-ex   Adjusted rw to proper height. Felt better per pt with gait. Pt demonstrates more upright posture with gait.   Directed patient with gait with rw x 32 ft x4 with cues to increase hip and knee flexion during L LE swing phase, and L knee extension and ankle DF during L LE stance phase.    seated L knee flexion and extension AAROM using soccer ball 10x5 seconds for 2 sets for each direction.    Blood pressure L arm sitting, mechanically taken: 150/85, HR 79  standing TKE with  bilateral UE assist from rw 10x2 with 5 seconds to work on knee extension ROM   Improved exercise technique, movement at target joints, use of target muscles after mod verbal, visual, tactile cues.     Improved L knee flexion during swing phase, and L knee extension and ankle DF during heel strike to foot flat phase when walking with rw after cues. Seated L knee extension -20 degrees, flexion 90 degrees. Improved L knee flexion ROM. Some difficulty with knee extension ROM.          PT Long Term Goals - 05/15/16 1927      PT LONG TERM GOAL #1   Title Patient will improve L knee extension seated AROM to at least -5 degrees to promote ability to ambulate and perform standing tasks.    Baseline Seated L knee extension AROM -19 degrees (05/15/2016)   Time 8   Period Weeks   Status New     PT LONG TERM GOAL #2   Title Patient will improve L knee flexion seated AROM to at least 115 degrees to promote ability to ambulate, get into and out of cars, negotiate stairs, perform standing tassks.    Baseline Seated L knee flexion AROM 71 degrees (05/15/2016)   Time 8   Period Weeks   Status New     PT LONG TERM GOAL #3   Title Patient will improve his LEFS score by at least 9 points as a demonstration of improved function.    Baseline 11/80 (05/15/2016)   Time 8   Period Weeks   Status New     PT LONG TERM GOAL #4   Title Patient will be able to ambulate at least 500 ft without AD to promote mobility.    Baseline Pt currently ambulates with rw.    Time 8   Period Weeks   Status New     PT LONG TERM GOAL #5   Title Patient will have 5/5 L knee extension strength and at least 4+/5 L knee flexion strength to promote ability to ambulate as well as to perform functional tasks.    Baseline L knee flexion and extension strength not yet tested to allow for more healing since pt is only 3 weeks post op.    Time 8   Period Weeks   Status New               Plan - 05/18/16 1255     Clinical Impression Statement Improved L knee flexion during swing phase, and L knee extension and ankle DF during heel strike to foot flat phase when walking with rw after cues. Seated L knee extension -  20 degrees, flexion 90 degrees. Improved L knee flexion ROM. Some difficulty with knee extension ROM.    Rehab Potential Good   Clinical Impairments Affecting Rehab Potential pain, swelling, healing time   PT Frequency 2x / week  2-3x/week   PT Duration 8 weeks   PT Treatment/Interventions Manual techniques;Therapeutic exercise;Therapeutic activities;Aquatic Therapy;Electrical Stimulation;Iontophoresis 4mg /ml Dexamethasone;Gait training;Stair training;Neuromuscular re-education;Patient/family education;Dry needling   PT Next Visit Plan ROM, manual techniques PRN, gait, gentle strengthening when appropriate   Consulted and Agree with Plan of Care Patient      Patient will benefit from skilled therapeutic intervention in order to improve the following deficits and impairments:  Pain, Decreased balance, Decreased range of motion, Decreased strength, Difficulty walking, Hypomobility, Improper body mechanics, Postural dysfunction  Visit Diagnosis: Left knee pain, unspecified chronicity  Difficulty in walking, not elsewhere classified  Muscle weakness (generalized)     Problem List Patient Active Problem List   Diagnosis Date Noted  . BPH without urinary obstruction 06/03/2015  . Depression 03/05/2015  . Hyperlipidemia 09/03/2014  . Major depressive disorder, recurrent episode, moderate ([place]) 09/03/2014  . Chronic low back pain 09/03/2014  . Arthralgia of both knees 09/03/2014  . Type 2 diabetes mellitus without complication ([place]) 123456  . Allergic rhinitis 07/04/2014  . Anxiety 07/04/2014  . Continuous opioid dependence ([place]) 07/04/2014  . DDD (degenerative disc disease), lumbar 07/04/2014  . Gout of foot 07/04/2014  . BP (high blood pressure) 07/04/2014  . Flu vaccine need  07/04/2014  . Adiposity 07/04/2014  . OA (osteoarthritis) of knee 07/04/2014  . Neuralgia neuritis, sciatic nerve 07/04/2014  . Arthralgia of multiple joints 07/04/2014  . H/O malignant neoplasm of skin 12/24/2013   Joneen Boers PT, DPT   05/18/2016, 2:41 PM  [place] [address] [place], [address] Phone: [phone]   Fax:  [phone]  Name: Evan Rivas MRN: [id number] Date of Birth: 1950-05-11

## 2016-05-18 NOTE — Patient Instructions (Addendum)
Pt was recommend to ambulate with L knee flexion during swing phase and L knee extension during stance phase and L ankle DF during heel strike whenever walking with his rw. Pt demonstrated and verbalized understanding.    Gave standing TKE with bilateral UE assist from rw 10x3 with 5 second holds daily as part of his HEP. Pt demonstrated and verbalized understanding.

## 2016-05-19 NOTE — Telephone Encounter (Signed)
Pt.notified

## 2016-05-24 ENCOUNTER — Ambulatory Visit: Payer: Medicare PPO

## 2016-05-24 DIAGNOSIS — R262 Difficulty in walking, not elsewhere classified: Secondary | ICD-10-CM

## 2016-05-24 DIAGNOSIS — M25562 Pain in left knee: Secondary | ICD-10-CM

## 2016-05-24 DIAGNOSIS — M6281 Muscle weakness (generalized): Secondary | ICD-10-CM

## 2016-05-24 NOTE — Therapy (Signed)
[place] [facility] [address] [place], [address] Phone: [phone]   Fax:  [phone]  Physical Therapy Treatment  Patient Details  Name: Evan Rivas MRN: [id number] Date of Birth: 01-19-1951 Referring Provider: Gaynelle Arabian, MD  Encounter Date: 05/24/2016      PT End of Session - 05/24/16 1515    Visit Number 3   Number of Visits 25   Date for PT Re-Evaluation 07/13/16   Authorization Type 3   Authorization Time Period of  10 g code   PT Start Time 1515   PT Stop Time 1610   PT Time Calculation (min) 55 min   Equipment Utilized During Treatment --  pt rw   Activity Tolerance Patient tolerated treatment well   Behavior During Therapy [facility] for tasks assessed/performed      Past Medical History:  Diagnosis Date  . Anxiety    panic attacks  . Arthritis   . Cancer (HCC)    lip  . Depression   . Diabetes mellitus without complication ([place])    type 2  . Gout   . History of kidney stones   . Hyperlipidemia   . Hypertension   . Pneumonia    2014    Past Surgical History:  Procedure Laterality Date  . ARTHROSCOPIC REPAIR ACL Left   . CYSTOSCOPY W/ URETERAL STENT PLACEMENT Right 09/07/2014   Procedure: CYSTOSCOPY WITH STENT REPLACEMENT;  Surgeon: Hollice Espy, MD;  Location: [facility];  Service: Urology;  Laterality: Right;  . CYSTOSCOPY/URETEROSCOPY/HOLMIUM LASER/STENT PLACEMENT Right 08/17/2014   Procedure: CYSTOSCOPY/URETEROSCOPY//STENT PLACEMENT/ attempt of lithotripsy;  Surgeon: Hollice Espy, MD;  Location: [facility];  Service: Urology;  Laterality: Right;  . HERNIA REPAIR    . MOHS SURGERY     lip  . TOTAL KNEE ARTHROPLASTY Left 04/24/2016   Procedure: LEFT TOTAL KNEE ARTHROPLASTY;  Surgeon: Gaynelle Arabian, MD;  Location: [facility];  Service: Orthopedics;  Laterality: Left;  Adductor Block  . URETEROSCOPY WITH HOLMIUM LASER LITHOTRIPSY Right 09/07/2014   Procedure: URETEROSCOPY WITH HOLMIUM  LASER LITHOTRIPSY;  Surgeon: Hollice Espy, MD;  Location: [facility];  Service: Urology;  Laterality: Right;  [name] VASECTOMY      There were no vitals filed for this visit.      Subjective Assessment - 05/24/16 1518    Subjective L knee is good. No pain currently. Took pain medication at 2 pm. Pt adds being able to get into and out of the car better now. Has an appointment with Dr. Wynelle Link on Tuesday 05/30/2016   Pertinent History S/P L TKA 04/24/2016 secondary to L knee pain and arthritis. Pt states his knee was bone on bone. Has been dealing with L knee pain for a long time. Went to rehab around 1995 which helped as much as it could. Currently having R knee pain as well and plans to get it replaced sometime this year. Had 7 sessions of home health PT which involved knee extension and flexion stretches, supine and standing hip abduction, standing heel raises. Uses a stool to get into and out of bed (high bed at home).  Prior to his procedure, pt was walking independently but with a lot of pain.    Patient Stated Goals Be able to walk the best I can, play a little golf.    Currently in Pain? No/denies   Pain Score 0-No pain   Pain Onset 1 to 4 weeks ago  surgical pain  PT Education - 05/24/16 1531    Education provided Yes   Education Details ther-ex, HEP   Person(s) Educated Patient   Methods Explanation;Demonstration;Tactile cues;Verbal cues   Comprehension Returned demonstration;Verbalized understanding       Objectives  4 weeks post op.   Manual therapy  Supine STM to hamstrings, leg supported by pillow     There-ex  Directed patient with supine quad set 10x3 with 5 seconds holds following STM to hamstrings in supine  -15 degrees supine L knee extension AROM in quad set position  Seated L knee flexion PROM by PT 10x3  Followed by [name] with PT assist 10x3   96 degrees seated L knee active flexion afterwards.    -20  degrees seated L knee extension AROM   Blood pressure L arm sitting, mechanically taken 156/79, HR 72  Seated L knee extension 10x3  Standing terminal knee extension with bilateral UE assist from rw 10x5 seconds for 2 sets   Gait in the gym with L knee flexion during swing phase and knee extension during stance phase, and ankle DF during heel strike 64 ft x 2  NuStep x 5 min. Seat 11, level 1, arms 9 to promote knee flexion ROM and cardiovascular work. No charge    Pt was recommended to prop his L leg up comfortably to get a small to medium knee extension stretch 2 min at a time throughout the day to help with ROM. Pt verbalized understanding.      Improved exercise technique, movement at target joints, use of target muscles after mod verbal, visual, tactile cues.    Improved L knee flexion AROM to 96 degrees today. Still demonstrates limited L knee extension ROM. Added a knee extension stretch as part of his HEP to help address. Please see pt instructions.         PT Long Term Goals - 05/15/16 1927      PT LONG TERM GOAL #1   Title Patient will improve L knee extension seated AROM to at least -5 degrees to promote ability to ambulate and perform standing tasks.    Baseline Seated L knee extension AROM -19 degrees (05/15/2016)   Time 8   Period Weeks   Status New     PT LONG TERM GOAL #2   Title Patient will improve L knee flexion seated AROM to at least 115 degrees to promote ability to ambulate, get into and out of cars, negotiate stairs, perform standing tassks.    Baseline Seated L knee flexion AROM 71 degrees (05/15/2016)   Time 8   Period Weeks   Status New     PT LONG TERM GOAL #3   Title Patient will improve his LEFS score by at least 9 points as a demonstration of improved function.    Baseline 11/80 (05/15/2016)   Time 8   Period Weeks   Status New     PT LONG TERM GOAL #4   Title Patient will be able to ambulate at least 500 ft without AD to promote mobility.     Baseline Pt currently ambulates with rw.    Time 8   Period Weeks   Status New     PT LONG TERM GOAL #5   Title Patient will have 5/5 L knee extension strength and at least 4+/5 L knee flexion strength to promote ability to ambulate as well as to perform functional tasks.    Baseline L knee flexion and extension strength not yet tested  to allow for more healing since pt is only 3 weeks post op.    Time 8   Period Weeks   Status New               Plan - 05/24/16 1532    Clinical Impression Statement Improved L knee flexion AROM to 96 degrees today. Still demonstrates limited L knee extension ROM. Added a knee extension stretch as part of his HEP to help address. Please see pt instructions.    Rehab Potential Good   Clinical Impairments Affecting Rehab Potential pain, swelling, healing time   PT Frequency 2x / week  2-3x/week   PT Duration 8 weeks   PT Treatment/Interventions Manual techniques;Therapeutic exercise;Therapeutic activities;Aquatic Therapy;Electrical Stimulation;Iontophoresis 4mg /ml Dexamethasone;Gait training;Stair training;Neuromuscular re-education;Patient/family education;Dry needling   PT Next Visit Plan ROM, manual techniques PRN, gait, gentle strengthening when appropriate   Consulted and Agree with Plan of Care Patient      Patient will benefit from skilled therapeutic intervention in order to improve the following deficits and impairments:  Pain, Decreased balance, Decreased range of motion, Decreased strength, Difficulty walking, Hypomobility, Improper body mechanics, Postural dysfunction  Visit Diagnosis: Left knee pain, unspecified chronicity  Difficulty in walking, not elsewhere classified  Muscle weakness (generalized)     Problem List Patient Active Problem List   Diagnosis Date Noted  . BPH without urinary obstruction 06/03/2015  . Depression 03/05/2015  . Hyperlipidemia 09/03/2014  . Major depressive disorder, recurrent episode,  moderate ([place]) 09/03/2014  . Chronic low back pain 09/03/2014  . Arthralgia of both knees 09/03/2014  . Type 2 diabetes mellitus without complication ([place]) 123456  . Allergic rhinitis 07/04/2014  . Anxiety 07/04/2014  . Continuous opioid dependence ([place]) 07/04/2014  . DDD (degenerative disc disease), lumbar 07/04/2014  . Gout of foot 07/04/2014  . BP (high blood pressure) 07/04/2014  . Flu vaccine need 07/04/2014  . Adiposity 07/04/2014  . OA (osteoarthritis) of knee 07/04/2014  . Neuralgia neuritis, sciatic nerve 07/04/2014  . Arthralgia of multiple joints 07/04/2014  . H/O malignant neoplasm of skin 12/24/2013     Joneen Boers PT, DPT  05/24/2016, 4:19 PM  [place] [facility] [address], [place], [address] Phone: [phone]   Fax:  [phone]  Name: Evan Rivas MRN: [id number] Date of Birth: 14-May-1950

## 2016-05-24 NOTE — Patient Instructions (Signed)
   Pt was recommended to prop his L leg up comfortably to get a small to medium knee extension stretch 2 min at a time throughout the day to help with ROM. Pt verbalized understanding.

## 2016-05-30 ENCOUNTER — Ambulatory Visit: Payer: Medicare PPO

## 2016-05-30 DIAGNOSIS — M6281 Muscle weakness (generalized): Secondary | ICD-10-CM

## 2016-05-30 DIAGNOSIS — R262 Difficulty in walking, not elsewhere classified: Secondary | ICD-10-CM

## 2016-05-30 DIAGNOSIS — Z96652 Presence of left artificial knee joint: Secondary | ICD-10-CM | POA: Diagnosis not present

## 2016-05-30 DIAGNOSIS — M25562 Pain in left knee: Secondary | ICD-10-CM

## 2016-05-30 DIAGNOSIS — Z471 Aftercare following joint replacement surgery: Secondary | ICD-10-CM | POA: Diagnosis not present

## 2016-05-30 NOTE — Therapy (Signed)
[facility] [address], [place], [address] Phone: [phone]   Fax:  [phone]  Physical Therapy Treatment  Patient Details  Name: Evan Rivas MRN: [id number] Date of Birth: September 30, 1950 Referring Provider: Gaynelle Arabian, MD  Encounter Date: 05/30/2016      PT End of Session - 05/30/16 1551    Visit Number 4   Number of Visits 25   Date for PT Re-Evaluation 07/13/16   Authorization Type 4   Authorization Time Period of  10 g code   PT Start Time [id number]   PT Stop Time 1619   PT Time Calculation (min) 28 min   Equipment Utilized During Treatment --  pt rw   Activity Tolerance Patient tolerated treatment well   Behavior During Therapy [facility] for tasks assessed/performed      Past Medical History:  Diagnosis Date  . Anxiety    panic attacks  . Arthritis   . Cancer (HCC)    lip  . Depression   . Diabetes mellitus without complication ([place])    type 2  . Gout   . History of kidney stones   . Hyperlipidemia   . Hypertension   . Pneumonia    2014    Past Surgical History:  Procedure Laterality Date  . ARTHROSCOPIC REPAIR ACL Left   . CYSTOSCOPY W/ URETERAL STENT PLACEMENT Right 09/07/2014   Procedure: CYSTOSCOPY WITH STENT REPLACEMENT;  Surgeon: Hollice Espy, MD;  Location: [facility];  Service: Urology;  Laterality: Right;  . CYSTOSCOPY/URETEROSCOPY/HOLMIUM LASER/STENT PLACEMENT Right 08/17/2014   Procedure: CYSTOSCOPY/URETEROSCOPY//STENT PLACEMENT/ attempt of lithotripsy;  Surgeon: Hollice Espy, MD;  Location: [facility];  Service: Urology;  Laterality: Right;  . HERNIA REPAIR    . MOHS SURGERY     lip  . TOTAL KNEE ARTHROPLASTY Left 04/24/2016   Procedure: LEFT TOTAL KNEE ARTHROPLASTY;  Surgeon: Gaynelle Arabian, MD;  Location: [facility];  Service: Orthopedics;  Laterality: Left;  Adductor Block  . URETEROSCOPY WITH HOLMIUM LASER LITHOTRIPSY Right 09/07/2014   Procedure: URETEROSCOPY WITH HOLMIUM  LASER LITHOTRIPSY;  Surgeon: Hollice Espy, MD;  Location: [facility];  Service: Urology;  Laterality: Right;  [name] VASECTOMY      There were no vitals filed for this visit.      Subjective Assessment - 05/30/16 1552    Subjective L knee is doing good. No pain. Has an appointment with Dr. Wynelle Link today at 5:30 pm   Pertinent History S/P L TKA 04/24/2016 secondary to L knee pain and arthritis. Pt states his knee was bone on bone. Has been dealing with L knee pain for a long time. Went to rehab around 1995 which helped as much as it could. Currently having R knee pain as well and plans to get it replaced sometime this year. Had 7 sessions of home health PT which involved knee extension and flexion stretches, supine and standing hip abduction, standing heel raises. Uses a stool to get into and out of bed (high bed at home).  Prior to his procedure, pt was walking independently but with a lot of pain.    Patient Stated Goals Be able to walk the best I can, play a little golf.    Currently in Pain? No/denies   Pain Score 0-No pain   Pain Onset 1 to 4 weeks ago  surgical pain  PT Education - 05/30/16 1604    Education provided Yes   Education Details ther-ex   [employer]) Educated Patient   Methods Explanation;Demonstration;Tactile cues;Verbal cues   Comprehension Returned demonstration;Verbalized understanding       Objectives  -20 degrees L knee seated extension AROM at start of session.     Manual therapy  Supine STM to hamstrings, leg supported by pillow     There-ex  Supine quad sets with leg propped on pillow 10x5 seconds to promote knee extension  Seated L knee extension LAQ 10x5 seconds  Seated L knee flexion PROM by PT 10x3             Followed by [name] with PT assist 10x3  Standing on rocker board: ankle DF/PF 2 min with bilateral UE assist to promote L knee extension ROM.   Improved exercise technique, movement  at target joints, use of target muscles after mod verbal, visual, tactile cues.     Improved seated L knee extension AROM to -14 degrees after STM to hamstrings in supine followed by supine quad sets to promote knee extension ROM. Pt able to achieve 100 degrees seated L knee flexion today.         PT Long Term Goals - 05/15/16 1927      PT LONG TERM GOAL #1   Title Patient will improve L knee extension seated AROM to at least -5 degrees to promote ability to ambulate and perform standing tasks.    Baseline Seated L knee extension AROM -19 degrees (05/15/2016)   Time 8   Period Weeks   Status New     PT LONG TERM GOAL #2   Title Patient will improve L knee flexion seated AROM to at least 115 degrees to promote ability to ambulate, get into and out of cars, negotiate stairs, perform standing tassks.    Baseline Seated L knee flexion AROM 71 degrees (05/15/2016)   Time 8   Period Weeks   Status New     PT LONG TERM GOAL #3   Title Patient will improve his LEFS score by at least 9 points as a demonstration of improved function.    Baseline 11/80 (05/15/2016)   Time 8   Period Weeks   Status New     PT LONG TERM GOAL #4   Title Patient will be able to ambulate at least 500 ft without AD to promote mobility.    Baseline Pt currently ambulates with rw.    Time 8   Period Weeks   Status New     PT LONG TERM GOAL #5   Title Patient will have 5/5 L knee extension strength and at least 4+/5 L knee flexion strength to promote ability to ambulate as well as to perform functional tasks.    Baseline L knee flexion and extension strength not yet tested to allow for more healing since pt is only 3 weeks post op.    Time 8   Period Weeks   Status New               Plan - 05/30/16 1604    Clinical Impression Statement Improved seated L knee extension AROM to -14 degrees after STM to hamstrings in supine followed by supine quad sets to promote knee extension ROM. Pt able to achieve  100 degrees seated L knee flexion today.    Rehab Potential Good   Clinical Impairments Affecting Rehab Potential pain, swelling, healing time   PT Frequency 2x /  week  2-3x/week   PT Duration 8 weeks   PT Treatment/Interventions Manual techniques;Therapeutic exercise;Therapeutic activities;Aquatic Therapy;Electrical Stimulation;Iontophoresis 4mg /ml Dexamethasone;Gait training;Stair training;Neuromuscular re-education;Patient/family education;Dry needling   PT Next Visit Plan ROM, manual techniques PRN, gait, gentle strengthening when appropriate   Consulted and Agree with Plan of Care Patient      Patient will benefit from skilled therapeutic intervention in order to improve the following deficits and impairments:  Pain, Decreased balance, Decreased range of motion, Decreased strength, Difficulty walking, Hypomobility, Improper body mechanics, Postural dysfunction  Visit Diagnosis: Left knee pain, unspecified chronicity  Difficulty in walking, not elsewhere classified  Muscle weakness (generalized)     Problem List Patient Active Problem List   Diagnosis Date Noted  . BPH without urinary obstruction 06/03/2015  . Depression 03/05/2015  . Hyperlipidemia 09/03/2014  . Major depressive disorder, recurrent episode, moderate ([place]) 09/03/2014  . Chronic low back pain 09/03/2014  . Arthralgia of both knees 09/03/2014  . Type 2 diabetes mellitus without complication ([place]) 123456  . Allergic rhinitis 07/04/2014  . Anxiety 07/04/2014  . Continuous opioid dependence ([place]) 07/04/2014  . DDD (degenerative disc disease), lumbar 07/04/2014  . Gout of foot 07/04/2014  . BP (high blood pressure) 07/04/2014  . Flu vaccine need 07/04/2014  . Adiposity 07/04/2014  . OA (osteoarthritis) of knee 07/04/2014  . Neuralgia neuritis, sciatic nerve 07/04/2014  . Arthralgia of multiple joints 07/04/2014  . H/O malignant neoplasm of skin 12/24/2013    Joneen Boers PT, DPT  05/30/2016, 6:07  PM  [facility] [address], [place], [address] Phone: [phone]   Fax:  [phone]  Name: Evan Rivas MRN: [id number] Date of Birth: 1950-04-25

## 2016-06-01 ENCOUNTER — Ambulatory Visit: Payer: Medicare PPO | Attending: Orthopedic Surgery

## 2016-06-01 DIAGNOSIS — M25562 Pain in left knee: Secondary | ICD-10-CM | POA: Diagnosis not present

## 2016-06-01 DIAGNOSIS — R262 Difficulty in walking, not elsewhere classified: Secondary | ICD-10-CM

## 2016-06-01 DIAGNOSIS — M6281 Muscle weakness (generalized): Secondary | ICD-10-CM | POA: Diagnosis not present

## 2016-06-01 NOTE — Therapy (Signed)
[facility] [address] [place], [address] Phone: [phone]   Fax:  [phone]  Physical Therapy Treatment  Patient Details  Name: Evan Rivas MRN: [id number] Date of Birth: 1950/08/09 Referring Provider: Gaynelle Arabian, MD  Encounter Date: 06/01/2016      PT End of Session - 06/01/16 1434    Visit Number 5   Number of Visits 25   Date for PT Re-Evaluation 07/13/16   Authorization Type 5   Authorization Time Period of  10 g code   PT Start Time [id number]   PT Stop Time 1503   PT Time Calculation (min) 29 min   Equipment Utilized During Treatment --  pt rw   Activity Tolerance Patient tolerated treatment well   Behavior During Therapy [facility] for tasks assessed/performed      Past Medical History:  Diagnosis Date  . Anxiety    panic attacks  . Arthritis   . Cancer (HCC)    lip  . Depression   . Diabetes mellitus without complication ([place])    type 2  . Gout   . History of kidney stones   . Hyperlipidemia   . Hypertension   . Pneumonia    2014    Past Surgical History:  Procedure Laterality Date  . ARTHROSCOPIC REPAIR ACL Left   . CYSTOSCOPY W/ URETERAL STENT PLACEMENT Right 09/07/2014   Procedure: CYSTOSCOPY WITH STENT REPLACEMENT;  Surgeon: Hollice Espy, MD;  Location: [facility];  Service: Urology;  Laterality: Right;  . CYSTOSCOPY/URETEROSCOPY/HOLMIUM LASER/STENT PLACEMENT Right 08/17/2014   Procedure: CYSTOSCOPY/URETEROSCOPY//STENT PLACEMENT/ attempt of lithotripsy;  Surgeon: Hollice Espy, MD;  Location: [facility];  Service: Urology;  Laterality: Right;  . HERNIA REPAIR    . MOHS SURGERY     lip  . TOTAL KNEE ARTHROPLASTY Left 04/24/2016   Procedure: LEFT TOTAL KNEE ARTHROPLASTY;  Surgeon: Gaynelle Arabian, MD;  Location: [facility];  Service: Orthopedics;  Laterality: Left;  Adductor Block  . URETEROSCOPY WITH HOLMIUM LASER LITHOTRIPSY Right 09/07/2014   Procedure: URETEROSCOPY WITH HOLMIUM  LASER LITHOTRIPSY;  Surgeon: Hollice Espy, MD;  Location: [facility];  Service: Urology;  Laterality: Right;  [name] VASECTOMY      There were no vitals filed for this visit.      Subjective Assessment - 06/01/16 1434    Subjective Pt states he is sore today. Pt states MD told him to start using the [facility] at his house and use the [facility] outside during short little distances. Cleared to drive. Went to the gym yesterday and was able to use the [employer] and the [employer] bike. Went to the grocery store, and used the grocery cart, and went to her granddaughter's play. 1/10 L knee pain currently.  MD states that his ROM is excellent.    Pertinent History S/P L TKA 04/24/2016 secondary to L knee pain and arthritis. Pt states his knee was bone on bone. Has been dealing with L knee pain for a long time. Went to rehab around 1995 which helped as much as it could. Currently having R knee pain as well and plans to get it replaced sometime this year. Had 7 sessions of home health PT which involved knee extension and flexion stretches, supine and standing hip abduction, standing heel raises. Uses a stool to get into and out of bed (high bed at home).  Prior to his procedure, pt was walking independently but with a lot of pain.    Patient Stated  Goals Be able to walk the best I can, play a little golf.    Currently in Pain? Yes   Pain Score 1    Pain Onset 1 to 4 weeks ago  surgical pain                                 PT Education - 06/01/16 1924    Education provided Yes   Education Details ther-ex   [employer]) Educated Patient   Methods Explanation;Demonstration;Tactile cues;Verbal cues   Comprehension Returned demonstration;Verbalized understanding        Objectives   There-ex  Directed patient with gait with SPC, 2 point gait pattern 100 ft for 2 sets, then 50 ft. Cues for L knee flexion during swing phase, knee extension during stance phase, ankle DF during heel strike.    Seated L knee flexion PROM by PT 10x3 Followed by [name] with PT assist 10x3   100 degrees seated L knee flexion AROM, 104 degrees AAROM  Improved exercise technique, movement at target joints, use of target muscles after min to mod verbal, visual, tactile cues.         Manual therapy  Supine STM to hamstrings, leg supported by pillow   -15 degrees seated L knee extension AROM    Able to ambulate with SPC without LOB and no increase in knee pain, and good 2 point gait pattern. L knee seated AROM -15 degrees to 100 degrees after treatment.         PT Long Term Goals - 05/15/16 1927      PT LONG TERM GOAL #1   Title Patient will improve L knee extension seated AROM to at least -5 degrees to promote ability to ambulate and perform standing tasks.    Baseline Seated L knee extension AROM -19 degrees (05/15/2016)   Time 8   Period Weeks   Status New     PT LONG TERM GOAL #2   Title Patient will improve L knee flexion seated AROM to at least 115 degrees to promote ability to ambulate, get into and out of cars, negotiate stairs, perform standing tassks.    Baseline Seated L knee flexion AROM 71 degrees (05/15/2016)   Time 8   Period Weeks   Status New     PT LONG TERM GOAL #3   Title Patient will improve his LEFS score by at least 9 points as a demonstration of improved function.    Baseline 11/80 (05/15/2016)   Time 8   Period Weeks   Status New     PT LONG TERM GOAL #4   Title Patient will be able to ambulate at least 500 ft without AD to promote mobility.    Baseline Pt currently ambulates with rw.    Time 8   Period Weeks   Status New     PT LONG TERM GOAL #5   Title Patient will have 5/5 L knee extension strength and at least 4+/5 L knee flexion strength to promote ability to ambulate as well as to perform functional tasks.    Baseline L knee flexion and extension strength not yet tested to allow for more healing since pt is only 3 weeks post  op.    Time 8   Period Weeks   Status New               Plan - 06/01/16 1925    Clinical Impression  Statement Able to ambulate with SPC without LOB and no increase in knee pain, and good 2 point gait pattern. L knee seated AROM -15 degrees to 100 degrees after treatment.    Rehab Potential Good   Clinical Impairments Affecting Rehab Potential pain, swelling, healing time   PT Frequency 2x / week  2-3x/week   PT Duration 8 weeks   PT Treatment/Interventions Manual techniques;Therapeutic exercise;Therapeutic activities;Aquatic Therapy;Electrical Stimulation;Iontophoresis 4mg /ml Dexamethasone;Gait training;Stair training;Neuromuscular re-education;Patient/family education;Dry needling   PT Next Visit Plan ROM, manual techniques PRN, gait, gentle strengthening when appropriate   Consulted and Agree with Plan of Care Patient      Patient will benefit from skilled therapeutic intervention in order to improve the following deficits and impairments:  Pain, Decreased balance, Decreased range of motion, Decreased strength, Difficulty walking, Hypomobility, Improper body mechanics, Postural dysfunction  Visit Diagnosis: Left knee pain, unspecified chronicity  Difficulty in walking, not elsewhere classified  Muscle weakness (generalized)     Problem List Patient Active Problem List   Diagnosis Date Noted  . BPH without urinary obstruction 06/03/2015  . Depression 03/05/2015  . Hyperlipidemia 09/03/2014  . Major depressive disorder, recurrent episode, moderate ([place]) 09/03/2014  . Chronic low back pain 09/03/2014  . Arthralgia of both knees 09/03/2014  . Type 2 diabetes mellitus without complication ([place]) 123456  . Allergic rhinitis 07/04/2014  . Anxiety 07/04/2014  . Continuous opioid dependence ([place]) 07/04/2014  . DDD (degenerative disc disease), lumbar 07/04/2014  . Gout of foot 07/04/2014  . BP (high blood pressure) 07/04/2014  . Flu vaccine need 07/04/2014  .  Adiposity 07/04/2014  . OA (osteoarthritis) of knee 07/04/2014  . Neuralgia neuritis, sciatic nerve 07/04/2014  . Arthralgia of multiple joints 07/04/2014  . H/O malignant neoplasm of skin 12/24/2013   Joneen Boers PT, DPT  06/01/2016, 7:33 PM  [facility] [address], [place], [address] Phone: [phone]   Fax:  [phone]  Name: TRACEY WITTEMAN MRN: [id number] Date of Birth: 1950-04-20

## 2016-06-06 ENCOUNTER — Ambulatory Visit: Payer: Medicare PPO

## 2016-06-06 DIAGNOSIS — M6281 Muscle weakness (generalized): Secondary | ICD-10-CM

## 2016-06-06 DIAGNOSIS — R262 Difficulty in walking, not elsewhere classified: Secondary | ICD-10-CM | POA: Diagnosis not present

## 2016-06-06 DIAGNOSIS — M25562 Pain in left knee: Secondary | ICD-10-CM | POA: Diagnosis not present

## 2016-06-06 NOTE — Therapy (Signed)
[place] [address] [place], [address] Phone: [phone]   Fax:  [phone]  Physical Therapy Treatment  Patient Details  Name: Evan Rivas MRN: [id number] Date of Birth: 1950/07/10 Referring Provider: Gaynelle Arabian, MD  Encounter Date: 06/06/2016      PT End of Session - 06/06/16 1619    Visit Number 6   Number of Visits 25   Date for PT Re-Evaluation 07/13/16   Authorization Type 6   Authorization Time Period of  10 g code   PT Start Time 1619   PT Stop Time 1701   PT Time Calculation (min) 42 min   Equipment Utilized During Treatment --  pt SPC   Activity Tolerance Patient tolerated treatment well   Behavior During Therapy [facility] for tasks assessed/performed      Past Medical History:  Diagnosis Date  . Anxiety    panic attacks  . Arthritis   . Cancer (HCC)    lip  . Depression   . Diabetes mellitus without complication ([place])    type 2  . Gout   . History of kidney stones   . Hyperlipidemia   . Hypertension   . Pneumonia    2014    Past Surgical History:  Procedure Laterality Date  . ARTHROSCOPIC REPAIR ACL Left   . CYSTOSCOPY W/ URETERAL STENT PLACEMENT Right 09/07/2014   Procedure: CYSTOSCOPY WITH STENT REPLACEMENT;  Surgeon: Hollice Espy, MD;  Location: [facility];  Service: Urology;  Laterality: Right;  . CYSTOSCOPY/URETEROSCOPY/HOLMIUM LASER/STENT PLACEMENT Right 08/17/2014   Procedure: CYSTOSCOPY/URETEROSCOPY//STENT PLACEMENT/ attempt of lithotripsy;  Surgeon: Hollice Espy, MD;  Location: [facility];  Service: Urology;  Laterality: Right;  . HERNIA REPAIR    . MOHS SURGERY     lip  . TOTAL KNEE ARTHROPLASTY Left 04/24/2016   Procedure: LEFT TOTAL KNEE ARTHROPLASTY;  Surgeon: Gaynelle Arabian, MD;  Location: [facility];  Service: Orthopedics;  Laterality: Left;  Adductor Block  . URETEROSCOPY WITH HOLMIUM LASER LITHOTRIPSY Right 09/07/2014   Procedure: URETEROSCOPY WITH HOLMIUM  LASER LITHOTRIPSY;  Surgeon: Hollice Espy, MD;  Location: [facility];  Service: Urology;  Laterality: Right;  [name] VASECTOMY      There were no vitals filed for this visit.      Subjective Assessment - 06/06/16 1621    Subjective L knee feels good. No pain. Did 15 min at the nustep at the gym yesterday and use the seated knee extension machine for 10 lbs. Did not bother his L knee.    Pertinent History S/P L TKA 04/24/2016 secondary to L knee pain and arthritis. Pt states his knee was bone on bone. Has been dealing with L knee pain for a long time. Went to rehab around 1995 which helped as much as it could. Currently having R knee pain as well and plans to get it replaced sometime this year. Had 7 sessions of home health PT which involved knee extension and flexion stretches, supine and standing hip abduction, standing heel raises. Uses a stool to get into and out of bed (high bed at home).  Prior to his procedure, pt was walking independently but with a lot of pain.    Patient Stated Goals Be able to walk the best I can, play a little golf.    Currently in Pain? No/denies   Pain Score 0-No pain   Pain Onset 1 to 4 weeks ago  surgical pain  PT Education - 06/06/16 1654    Education provided Yes   Education Details ther-ex   [employer]) Educated Patient   Methods Explanation;Demonstration;Tactile cues;Verbal cues   Comprehension Returned demonstration;Verbalized understanding         Objectives  Surgical incision healed  Pt currently 6 weeks post op  Seated L knee AROM -15 degrees to 100 degrees at start of session    There-ex  Directed patient with  Seated L knee flexion PROM by PT 10x3 Followed by AAROM with PT assist 10x3                        108 seated L knee flexion  AAROM  Standing L knee TKE with bilateral UE assist 10x5 seconds for 2 sets  Quad set with gentle over pressure 10x5 seconds for 3  sets (following STM to hamstrings in supine  -15 degrees L knee extension seated AROM (R knee seated AROM is -16 degrees measured at eval)   Standing mini squats with bilateral UE assist 10x2  Standing on rocker board ankle DF/PF with bilateral UE assist x 2 min to promote knee extension, hamstring and gastroc flexibility.   Standing L hip abduction with bilateral UE assist to promote L glute med strengthening. 10x, then 10x5 seconds     Improved exercise technique, movement at target joints, use of target muscles after min to mod verbal, visual, tactile cues.        Manual therapy  Supine STM to hamstrings, leg supported by pillow    Improved seated L knee flexion AROM to 108 degrees after exercises. L knee extension stayed at -15 degrees. Continued working on hamstring and gastroc flexibility, quad strengthening to promote knee extension and gentle LE strengthening to help promote function. Pt currently ambulating with SPC and without LOB.         PT Long Term Goals - 05/15/16 1927      PT LONG TERM GOAL #1   Title Patient will improve L knee extension seated AROM to at least -5 degrees to promote ability to ambulate and perform standing tasks.    Baseline Seated L knee extension AROM -19 degrees (05/15/2016)   Time 8   Period Weeks   Status New     PT LONG TERM GOAL #2   Title Patient will improve L knee flexion seated AROM to at least 115 degrees to promote ability to ambulate, get into and out of cars, negotiate stairs, perform standing tassks.    Baseline Seated L knee flexion AROM 71 degrees (05/15/2016)   Time 8   Period Weeks   Status New     PT LONG TERM GOAL #3   Title Patient will improve his LEFS score by at least 9 points as a demonstration of improved function.    Baseline 11/80 (05/15/2016)   Time 8   Period Weeks   Status New     PT LONG TERM GOAL #4   Title Patient will be able to ambulate at least 500 ft without AD to promote mobility.     Baseline Pt currently ambulates with rw.    Time 8   Period Weeks   Status New     PT LONG TERM GOAL #5   Title Patient will have 5/5 L knee extension strength and at least 4+/5 L knee flexion strength to promote ability to ambulate as well as to perform functional tasks.    Baseline L knee flexion and extension  strength not yet tested to allow for more healing since pt is only 3 weeks post op.    Time 8   Period Weeks   Status New               Plan - 06/06/16 1654    Clinical Impression Statement Improved seated L knee flexion AROM to 108 degrees after exercises. L knee extension stayed at -15 degrees. Continued working on hamstring and gastroc flexibility, quad strengthening to promote knee extension and gentle LE strengthening to help promote function. Pt currently ambulating with SPC and without LOB.    Rehab Potential Good   Clinical Impairments Affecting Rehab Potential pain, swelling, healing time   PT Frequency 2x / week  2-3x/week   PT Duration 8 weeks   PT Treatment/Interventions Manual techniques;Therapeutic exercise;Therapeutic activities;Aquatic Therapy;Electrical Stimulation;Iontophoresis 4mg /ml Dexamethasone;Gait training;Stair training;Neuromuscular re-education;Patient/family education;Dry needling   PT Next Visit Plan ROM, manual techniques PRN, gait, gentle strengthening when appropriate   Consulted and Agree with Plan of Care Patient      Patient will benefit from skilled therapeutic intervention in order to improve the following deficits and impairments:  Pain, Decreased balance, Decreased range of motion, Decreased strength, Difficulty walking, Hypomobility, Improper body mechanics, Postural dysfunction  Visit Diagnosis: Difficulty in walking, not elsewhere classified  Muscle weakness (generalized)  Left knee pain, unspecified chronicity     Problem List Patient Active Problem List   Diagnosis Date Noted  . BPH without urinary obstruction  06/03/2015  . Depression 03/05/2015  . Hyperlipidemia 09/03/2014  . Major depressive disorder, recurrent episode, moderate ([place]) 09/03/2014  . Chronic low back pain 09/03/2014  . Arthralgia of both knees 09/03/2014  . Type 2 diabetes mellitus without complication ([place]) 123456  . Allergic rhinitis 07/04/2014  . Anxiety 07/04/2014  . Continuous opioid dependence ([place]) 07/04/2014  . DDD (degenerative disc disease), lumbar 07/04/2014  . Gout of foot 07/04/2014  . BP (high blood pressure) 07/04/2014  . Flu vaccine need 07/04/2014  . Adiposity 07/04/2014  . OA (osteoarthritis) of knee 07/04/2014  . Neuralgia neuritis, sciatic nerve 07/04/2014  . Arthralgia of multiple joints 07/04/2014  . H/O malignant neoplasm of skin 12/24/2013    Joneen Boers PT, DPT   06/06/2016, 6:57 PM  [place] [facility] [address] [place], [address] Phone: [phone]   Fax:  [phone]  Name: ESTIVEN PHILLIPE MRN: [id number] Date of Birth: [birth year]

## 2016-06-08 ENCOUNTER — Ambulatory Visit: Payer: Medicare PPO

## 2016-06-08 DIAGNOSIS — M25562 Pain in left knee: Secondary | ICD-10-CM | POA: Diagnosis not present

## 2016-06-08 DIAGNOSIS — R262 Difficulty in walking, not elsewhere classified: Secondary | ICD-10-CM

## 2016-06-08 DIAGNOSIS — M6281 Muscle weakness (generalized): Secondary | ICD-10-CM | POA: Diagnosis not present

## 2016-06-08 NOTE — Therapy (Signed)
[place] [facility] [address], [place], [address] Phone: [phone]   Fax:  [phone]  Physical Therapy Treatment  Patient Details  Name: Evan Rivas MRN: [id number] Date of Birth: 1950/10/27 Referring Provider: Gaynelle Arabian, MD  Encounter Date: 06/08/2016      PT End of Session - 06/08/16 0937    Visit Number 7   Number of Visits 25   Date for PT Re-Evaluation 07/13/16   Authorization Type 7   Authorization Time Period of  10 g code   PT Start Time 0937   PT Stop Time 1022   PT Time Calculation (min) 45 min   Equipment Utilized During Treatment --  pt SPC   Activity Tolerance Patient tolerated treatment well   Behavior During Therapy [facility] for tasks assessed/performed      Past Medical History:  Diagnosis Date  . Anxiety    panic attacks  . Arthritis   . Cancer (HCC)    lip  . Depression   . Diabetes mellitus without complication ([place])    type 2  . Gout   . History of kidney stones   . Hyperlipidemia   . Hypertension   . Pneumonia    2014    Past Surgical History:  Procedure Laterality Date  . ARTHROSCOPIC REPAIR ACL Left   . CYSTOSCOPY W/ URETERAL STENT PLACEMENT Right 09/07/2014   Procedure: CYSTOSCOPY WITH STENT REPLACEMENT;  Surgeon: Hollice Espy, MD;  Location: [facility];  Service: Urology;  Laterality: Right;  . CYSTOSCOPY/URETEROSCOPY/HOLMIUM LASER/STENT PLACEMENT Right 08/17/2014   Procedure: CYSTOSCOPY/URETEROSCOPY//STENT PLACEMENT/ attempt of lithotripsy;  Surgeon: Hollice Espy, MD;  Location: [facility];  Service: Urology;  Laterality: Right;  . HERNIA REPAIR    . MOHS SURGERY     lip  . TOTAL KNEE ARTHROPLASTY Left 04/24/2016   Procedure: LEFT TOTAL KNEE ARTHROPLASTY;  Surgeon: Gaynelle Arabian, MD;  Location: [facility];  Service: Orthopedics;  Laterality: Left;  Adductor Block  . URETEROSCOPY WITH HOLMIUM LASER LITHOTRIPSY Right 09/07/2014   Procedure: URETEROSCOPY WITH HOLMIUM  LASER LITHOTRIPSY;  Surgeon: Hollice Espy, MD;  Location: [facility];  Service: Urology;  Laterality: Right;  [name] VASECTOMY      There were no vitals filed for this visit.      Subjective Assessment - 06/08/16 0939    Subjective Pt states that he was able to perform full revolutions at the recumbent bike the other day. The manual therapy the other day helped.  Doing the leg press, mini squats, and standing hip abduction at the gym.    Pertinent History S/P L TKA 04/24/2016 secondary to L knee pain and arthritis. Pt states his knee was bone on bone. Has been dealing with L knee pain for a long time. Went to rehab around 1995 which helped as much as it could. Currently having R knee pain as well and plans to get it replaced sometime this year. Had 7 sessions of home health PT which involved knee extension and flexion stretches, supine and standing hip abduction, standing heel raises. Uses a stool to get into and out of bed (high bed at home).  Prior to his procedure, pt was walking independently but with a lot of pain.    Patient Stated Goals Be able to walk the best I can, play a little golf.    Currently in Pain? No/denies   Pain Score 0-No pain   Pain Onset 1 to 4 weeks ago  surgical pain  PT Education - 06/08/16 0957    Education provided Yes   Education Details ther-ex   Person(s) Educated Patient   Methods Explanation;Demonstration;Tactile cues;Verbal cues   Comprehension Returned demonstration;Verbalized understanding        Objectives   Seated L knee AROM -12 degrees to 100 degrees at start of session   Manual therapy  Supine STM to hamstrings, leg supported by pillow      There-ex  Directed patient with Supine quad set with gentle over pressure 10x5 seconds for 3 sets (following STM to hamstrings in supine             Standing on rocker board ankle DF/PF with bilateral UE assist x 2 min to promote knee  extension, hamstring and gastroc flexibility.   Seated L knee flexion PROM by PT 10x3 Followed by [name] with PT assist 10x4 109 seated L knee flexion  AAROM  Forward step up onto Air Ex pad with L LE, R UE assist 10x3   Improved exercise technique, movement at target joints, use of target muscles after min to mod verbal, visual, tactile cues.     Improving seated L knee extension AROM, slight improvement with seated L knee flexion AROM since last session. Added forward step up onto Air Ex pad with L LE to work on quadriceps strength and control.          PT Long Term Goals - 05/15/16 1927      PT LONG TERM GOAL #1   Title Patient will improve L knee extension seated AROM to at least -5 degrees to promote ability to ambulate and perform standing tasks.    Baseline Seated L knee extension AROM -19 degrees (05/15/2016)   Time 8   Period Weeks   Status New     PT LONG TERM GOAL #2   Title Patient will improve L knee flexion seated AROM to at least 115 degrees to promote ability to ambulate, get into and out of cars, negotiate stairs, perform standing tassks.    Baseline Seated L knee flexion AROM 71 degrees (05/15/2016)   Time 8   Period Weeks   Status New     PT LONG TERM GOAL #3   Title Patient will improve his LEFS score by at least 9 points as a demonstration of improved function.    Baseline 11/80 (05/15/2016)   Time 8   Period Weeks   Status New     PT LONG TERM GOAL #4   Title Patient will be able to ambulate at least 500 ft without AD to promote mobility.    Baseline Pt currently ambulates with rw.    Time 8   Period Weeks   Status New     PT LONG TERM GOAL #5   Title Patient will have 5/5 L knee extension strength and at least 4+/5 L knee flexion strength to promote ability to ambulate as well as to perform functional tasks.    Baseline L knee flexion and extension strength not yet tested to allow for more healing since pt  is only 3 weeks post op.    Time 8   Period Weeks   Status New               Plan - 06/08/16 0935    Clinical Impression Statement Improving seated L knee extension AROM, slight improvement with seated L knee flexion AROM since last session. Added forward step up onto Air Ex pad with L LE to work  on quadriceps strength and control.    Rehab Potential Good   Clinical Impairments Affecting Rehab Potential pain, swelling, healing time   PT Frequency 2x / week  2-3x/week   PT Duration 8 weeks   PT Treatment/Interventions Manual techniques;Therapeutic exercise;Therapeutic activities;Aquatic Therapy;Electrical Stimulation;Iontophoresis 4mg /ml Dexamethasone;Gait training;Stair training;Neuromuscular re-education;Patient/family education;Dry needling   PT Next Visit Plan ROM, manual techniques PRN, gait, gentle strengthening when appropriate   Consulted and Agree with Plan of Care Patient      Patient will benefit from skilled therapeutic intervention in order to improve the following deficits and impairments:  Pain, Decreased balance, Decreased range of motion, Decreased strength, Difficulty walking, Hypomobility, Improper body mechanics, Postural dysfunction  Visit Diagnosis: Muscle weakness (generalized)  Difficulty in walking, not elsewhere classified  Left knee pain, unspecified chronicity     Problem List Patient Active Problem List   Diagnosis Date Noted  . BPH without urinary obstruction 06/03/2015  . Depression 03/05/2015  . Hyperlipidemia 09/03/2014  . Major depressive disorder, recurrent episode, moderate ([place]) 09/03/2014  . Chronic low back pain 09/03/2014  . Arthralgia of both knees 09/03/2014  . Type 2 diabetes mellitus without complication ([place]) 36/62/9476  . Allergic rhinitis 07/04/2014  . Anxiety 07/04/2014  . Continuous opioid dependence ([place]) 07/04/2014  . DDD (degenerative disc disease), lumbar 07/04/2014  . Gout of foot 07/04/2014  . BP (high blood  pressure) 07/04/2014  . Flu vaccine need 07/04/2014  . Adiposity 07/04/2014  . OA (osteoarthritis) of knee 07/04/2014  . Neuralgia neuritis, sciatic nerve 07/04/2014  . Arthralgia of multiple joints 07/04/2014  . H/O malignant neoplasm of skin 12/24/2013    Joneen Boers PT, DPT   06/08/2016, 10:26 AM  [place] [facility] [address], [place], [address] Phone: [phone]   Fax:  [phone]  Name: Evan Rivas MRN: [id number] Date of Birth: 11/12/1950

## 2016-06-09 ENCOUNTER — Encounter: Payer: Self-pay | Admitting: Family Medicine

## 2016-06-09 ENCOUNTER — Ambulatory Visit (INDEPENDENT_AMBULATORY_CARE_PROVIDER_SITE_OTHER): Payer: Medicare PPO | Admitting: Family Medicine

## 2016-06-09 ENCOUNTER — Other Ambulatory Visit: Payer: Self-pay | Admitting: Family Medicine

## 2016-06-09 VITALS — BP 138/76 | HR 86 | Temp 98.2°F | Resp 18 | Ht 74.0 in | Wt 262.1 lb

## 2016-06-09 DIAGNOSIS — M1A4720 Other secondary chronic gout, left ankle and foot, without tophus (tophi): Secondary | ICD-10-CM | POA: Diagnosis not present

## 2016-06-09 DIAGNOSIS — E119 Type 2 diabetes mellitus without complications: Secondary | ICD-10-CM | POA: Diagnosis not present

## 2016-06-09 DIAGNOSIS — E7800 Pure hypercholesterolemia, unspecified: Secondary | ICD-10-CM

## 2016-06-09 DIAGNOSIS — I10 Essential (primary) hypertension: Secondary | ICD-10-CM

## 2016-06-09 DIAGNOSIS — G8929 Other chronic pain: Secondary | ICD-10-CM

## 2016-06-09 DIAGNOSIS — M170 Bilateral primary osteoarthritis of knee: Secondary | ICD-10-CM

## 2016-06-09 DIAGNOSIS — N400 Enlarged prostate without lower urinary tract symptoms: Secondary | ICD-10-CM

## 2016-06-09 DIAGNOSIS — F419 Anxiety disorder, unspecified: Secondary | ICD-10-CM

## 2016-06-09 DIAGNOSIS — M545 Low back pain: Secondary | ICD-10-CM | POA: Diagnosis not present

## 2016-06-09 LAB — POCT GLYCOSYLATED HEMOGLOBIN (HGB A1C): Hemoglobin A1C: 6.9

## 2016-06-09 LAB — GLUCOSE, POCT (MANUAL RESULT ENTRY): POC Glucose: 165 mg/dl — AB (ref 70–99)

## 2016-06-09 MED ORDER — TAMSULOSIN HCL 0.4 MG PO CAPS: 0.4000 mg | ORAL_CAPSULE | Freq: Every day | ORAL | 0 refills | Status: DC

## 2016-06-09 MED ORDER — ATORVASTATIN CALCIUM 40 MG PO TABS: 40.0000 mg | ORAL_TABLET | Freq: Every day | ORAL | 0 refills | Status: DC

## 2016-06-09 MED ORDER — METFORMIN HCL ER 500 MG PO TB24
1000.0000 mg | ORAL_TABLET | Freq: Two times a day (BID) | ORAL | 1 refills | Status: DC
Start: 2016-06-09 — End: 2016-09-11

## 2016-06-09 MED ORDER — TIZANIDINE HCL 4 MG PO TABS: 4.0000 mg | ORAL_TABLET | Freq: Every day | ORAL | 0 refills | Status: DC

## 2016-06-09 MED ORDER — NABUMETONE 500 MG PO TABS: 500.0000 mg | ORAL_TABLET | Freq: Two times a day (BID) | ORAL | 0 refills | Status: DC

## 2016-06-09 MED ORDER — ALLOPURINOL 300 MG PO TABS: 300.0000 mg | ORAL_TABLET | Freq: Every evening | ORAL | 0 refills | Status: DC

## 2016-06-09 MED ORDER — GLIPIZIDE 5 MG PO TABS: 5.0000 mg | ORAL_TABLET | Freq: Every day | ORAL | 0 refills | Status: DC

## 2016-06-09 MED ORDER — HYDROCODONE-ACETAMINOPHEN 10-325 MG PO TABS: 1.0000 | ORAL_TABLET | Freq: Three times a day (TID) | ORAL | 0 refills | Status: DC | PRN

## 2016-06-09 MED ORDER — CLONAZEPAM 1 MG PO TABS
1.0000 mg | ORAL_TABLET | Freq: Two times a day (BID) | ORAL | 0 refills | Status: DC | PRN
Start: 2016-06-09 — End: 2016-12-12

## 2016-06-09 MED ORDER — AMLODIPINE BESYLATE 10 MG PO TABS: 10.0000 mg | ORAL_TABLET | Freq: Every day | ORAL | 1 refills | Status: DC

## 2016-06-09 MED ORDER — METOPROLOL SUCCINATE ER 100 MG PO TB24: 100.0000 mg | ORAL_TABLET | ORAL | 0 refills | Status: DC

## 2016-06-09 MED ORDER — METOPROLOL SUCCINATE ER 25 MG PO TB24: 25.0000 mg | ORAL_TABLET | ORAL | 1 refills | Status: DC

## 2016-06-09 MED ORDER — LOSARTAN POTASSIUM 100 MG PO TABS: 100.0000 mg | ORAL_TABLET | Freq: Every day | ORAL | 0 refills | Status: DC

## 2016-06-09 NOTE — Progress Notes (Signed)
Name: Evan Rivas   MRN: [id number]    DOB: Feb 25, 1951   Date:06/09/2016       Progress Note  Subjective  Chief Complaint  Chief Complaint  Patient presents with  . Diabetes  . Hypertension    Diabetes  He presents for his follow-up diabetic visit. He has type 2 diabetes mellitus. His disease course has been stable. Hypoglycemia symptoms include nervousness/anxiousness. Pertinent negatives for hypoglycemia include no headaches or pallor. Pertinent negatives for diabetes include no blurred vision, no chest pain, no fatigue, no polydipsia and no polyuria. Pertinent negatives for diabetic complications include no CVA, heart disease or peripheral neuropathy. Current diabetic treatment includes oral agent (triple therapy). His weight is stable. He is following a diabetic diet. He participates in exercise daily. His breakfast blood glucose range is generally 110-130 mg/dl. An ACE inhibitor/angiotensin II receptor blocker is being taken.  Hypertension  This is a chronic problem. The problem is unchanged. The problem is controlled. Associated symptoms include anxiety. Pertinent negatives include no blurred vision, chest pain, headaches, palpitations or shortness of breath. Past treatments include calcium channel blockers, angiotensin blockers and beta blockers. There is no history of CVA.  Hyperlipidemia  This is a chronic problem. The problem is controlled. Recent lipid tests were reviewed and are normal. Pertinent negatives include no chest pain, leg pain, myalgias or shortness of breath. Current antihyperlipidemic treatment includes statins.  Arthritis  Presents for follow-up visit. He complains of pain and stiffness. Affected locations include the right knee and left knee (s/p left knee replacement). Pertinent negatives include no fatigue.  Anxiety  Presents for follow-up visit. Symptoms include excessive worry, irritability, nervous/anxious behavior and panic. Patient reports no chest pain,  palpitations or shortness of breath. The severity of symptoms is moderate and causing significant distress.    Benign Prostatic Hypertrophy  This is a chronic problem. Obstructive symptoms do not include dribbling, a slower stream or straining. Pertinent negatives include no chills or hematuria.     Past Medical History:  Diagnosis Date  . Anxiety    panic attacks  . Arthritis   . Cancer (HCC)    lip  . Depression   . Diabetes mellitus without complication ([place])    type 2  . Gout   . History of kidney stones   . Hyperlipidemia   . Hypertension   . Pneumonia    2014    Past Surgical History:  Procedure Laterality Date  . ARTHROSCOPIC REPAIR ACL Left   . CYSTOSCOPY W/ URETERAL STENT PLACEMENT Right 09/07/2014   Procedure: CYSTOSCOPY WITH STENT REPLACEMENT;  Surgeon: Hollice Espy, MD;  Location: [facility];  Service: Urology;  Laterality: Right;  . CYSTOSCOPY/URETEROSCOPY/HOLMIUM LASER/STENT PLACEMENT Right 08/17/2014   Procedure: CYSTOSCOPY/URETEROSCOPY//STENT PLACEMENT/ attempt of lithotripsy;  Surgeon: Hollice Espy, MD;  Location: [facility];  Service: Urology;  Laterality: Right;  . HERNIA REPAIR    . MOHS SURGERY     lip  . TOTAL KNEE ARTHROPLASTY Left 04/24/2016   Procedure: LEFT TOTAL KNEE ARTHROPLASTY;  Surgeon: Gaynelle Arabian, MD;  Location: [facility];  Service: Orthopedics;  Laterality: Left;  Adductor Block  . URETEROSCOPY WITH HOLMIUM LASER LITHOTRIPSY Right 09/07/2014   Procedure: URETEROSCOPY WITH HOLMIUM LASER LITHOTRIPSY;  Surgeon: Hollice Espy, MD;  Location: [facility];  Service: Urology;  Laterality: Right;  [name] VASECTOMY      Family History  Problem Relation Age of Onset  . Hypertension Mother   . Cancer Mother   . Stroke Father   .  Depression Sister     Social History   Social History  . Marital status: Married    Spouse name: N/A  . Number of children: N/A  . Years of education: N/A   Occupational History  . Not on file.   Social History Main  Topics  . Smoking status: Never Smoker  . Smokeless tobacco: Never Used  . Alcohol use No  . Drug use: No  . Sexual activity: Yes   Other Topics Concern  . Not on file   Social History Narrative  . No narrative on file     Current Outpatient Prescriptions:  .  allopurinol (ZYLOPRIM) 300 MG tablet, Take 1 tablet (300 mg total) by mouth daily. (Patient taking differently: Take 300 mg by mouth every evening. ), Disp: 90 tablet, Rfl: 1 .  amLODipine (NORVASC) 10 MG tablet, Take 1 tablet (10 mg total) by mouth daily., Disp: 90 tablet, Rfl: 1 .  aspirin 81 MG tablet, Take 81 mg by mouth daily., Disp: , Rfl:  .  atorvastatin (LIPITOR) 40 MG tablet, Take 1 tablet (40 mg total) by mouth at bedtime. At Bedtime (Patient taking differently: Take 40 mg by mouth at bedtime. ), Disp: 90 tablet, Rfl: 1 .  clonazePAM (KLONOPIN) 1 MG tablet, Take 1 tablet (1 mg total) by mouth 2 (two) times daily as needed. (Patient taking differently: Take 1 mg by mouth at bedtime. ), Disp: 180 tablet, Rfl: 0 .  FLUoxetine (PROZAC) 40 MG capsule, Take 1 capsule (40 mg total) by mouth every morning., Disp: 90 capsule, Rfl: 1 .  fluticasone (FLONASE) 50 MCG/ACT nasal spray, Place 2 sprays into both nostrils daily. 2 sprays (Patient taking differently: Place 2 sprays into both nostrils daily as needed (for allergies/sinuses). ), Disp: 16 g, Rfl: 0 .  glipiZIDE (GLUCOTROL) 5 MG tablet, Take 1 tablet (5 mg total) by mouth daily., Disp: 90 tablet, Rfl: 0 .  HYDROcodone-acetaminophen (NORCO) 10-325 MG tablet, Take 1 tablet by mouth every 8 (eight) hours as needed., Disp: 90 tablet, Rfl: 0 .  losartan (COZAAR) 100 MG tablet, Take 1 tablet (100 mg total) by mouth daily. (Patient taking differently: Take 100 mg by mouth at bedtime. ), Disp: 90 tablet, Rfl: 0 .  metFORMIN (GLUCOPHAGE-XR) 500 MG 24 hr tablet, Take 2 tablets (1,000 mg total) by mouth 2 (two) times daily. 1000mg  BID, Disp: 360 tablet, Rfl: 1 .  metoprolol succinate  (TOPROL-XL) 100 MG 24 hr tablet, Take 1 tablet (100 mg total) by mouth every morning., Disp: 90 tablet, Rfl: 0 .  metoprolol succinate (TOPROL-XL) 25 MG 24 hr tablet, Take 1 tablet (25 mg total) by mouth every morning., Disp: 90 tablet, Rfl: 1 .  Multiple Vitamin (MULTIVITAMINS PO), Take by mouth., Disp: , Rfl:  .  NABUMETONE PO, Take by mouth., Disp: , Rfl:  .  tamsulosin (FLOMAX) 0.4 MG CAPS capsule, Take 1 capsule (0.4 mg total) by mouth daily after supper., Disp: 90 capsule, Rfl: 0 .  tiZANidine (ZANAFLEX) 4 MG tablet, Take 1 tablet (4 mg total) by mouth as needed. (Patient taking differently: Take 4 mg by mouth every 6 (six) hours as needed for muscle spasms. ), Disp: 90 tablet, Rfl: 2  Allergies  Allergen Reactions  . 2,4-D Dimethylamine (Amisol) Nausea And Vomiting    Pt is not aware of this  . Celebrex  [Celecoxib]     GI  . Tetanus Toxoids Other (See Comments)    unkown  . Tetanus-Diphtheria Toxoids Td  Other reaction(s): Unknown     Review of Systems  Constitutional: Positive for irritability. Negative for chills and fatigue.  Eyes: Negative for blurred vision.  Respiratory: Negative for shortness of breath.   Cardiovascular: Negative for chest pain and palpitations.  Genitourinary: Negative for hematuria.  Musculoskeletal: Positive for arthritis and stiffness. Negative for myalgias.  Skin: Negative for pallor.  Neurological: Negative for headaches.  Endo/Heme/Allergies: Negative for polydipsia.  Psychiatric/Behavioral: The patient is nervous/anxious.     Objective  Vitals:   06/09/16 0817  BP: 138/76  Pulse: 86  Resp: 18  Temp: 98.2 F (36.8 C)  TempSrc: Oral  SpO2: 94%  Weight: 262 lb 1.6 oz (118.9 kg)  Height: 6\' 2"  (1.88 m)    Physical Exam  Constitutional: He is oriented to person, place, and time and well-developed, well-nourished, and in no distress.  HENT:  Head: Normocephalic and atraumatic.  Cardiovascular: Normal rate, regular rhythm and  normal heart sounds.   No murmur heard. Pulmonary/Chest: Effort normal and breath sounds normal. He has no wheezes.  Abdominal: Soft. Bowel sounds are normal. There is no tenderness.  Musculoskeletal: He exhibits edema (3+ pitting edema bilateral lower extremities).       Right knee: He exhibits swelling. No tenderness found.       Left knee: He exhibits swelling. No tenderness found.       Right ankle: He exhibits swelling.       Left ankle: He exhibits swelling.  Lower extremity wrapped in ted hose stockings.  Neurological: He is alert and oriented to person, place, and time.  Psychiatric: Mood, memory, affect and judgment normal.  Nursing note and vitals reviewed.   Assessment & Plan  1. Anxiety Stable and responsive to clonazepam taken up to twice daily when needed, refills provided - clonazePAM (KLONOPIN) 1 MG tablet; Take 1 tablet (1 mg total) by mouth 2 (two) times daily as needed.  Dispense: 180 tablet; Refill: 0  2. BPH without urinary obstruction  - tamsulosin (FLOMAX) 0.4 MG CAPS capsule; Take 1 capsule (0.4 mg total) by mouth daily after supper.  Dispense: 90 capsule; Refill: 0  3. Chronic midline low back pain without sciatica Tizanidine take along with hydrocodone helps relieve chronic low back pain, refills provided - tiZANidine (ZANAFLEX) 4 MG tablet; Take 1 tablet (4 mg total) by mouth at bedtime.  Dispense: 90 tablet; Refill: 0  4. Essential hypertension BP stable on present evidence of therapy - amLODipine (NORVASC) 10 MG tablet; Take 1 tablet (10 mg total) by mouth daily.  Dispense: 90 tablet; Refill: 1 - losartan (COZAAR) 100 MG tablet; Take 1 tablet (100 mg total) by mouth at bedtime.  Dispense: 90 tablet; Refill: 0 - metoprolol succinate (TOPROL-XL) 100 MG 24 hr tablet; Take 1 tablet (100 mg total) by mouth every morning.  Dispense: 90 tablet; Refill: 0 - metoprolol succinate (TOPROL-XL) 25 MG 24 hr tablet; Take 1 tablet (25 mg total) by mouth every morning.   Dispense: 90 tablet; Refill: 1  5. Pure hypercholesterolemia  - atorvastatin (LIPITOR) 40 MG tablet; Take 1 tablet (40 mg total) by mouth at bedtime.  Dispense: 90 tablet; Refill: 0 - Lipid panel  6. 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  Dispense: 90 tablet; Refill: 0  7. Primary osteoarthritis of both knees Now status post left knee replacement, continue on hydrocodone and nabumetone as prescribed, patient follows  up with orthopedics and is undergoing postop physical therapy - HYDROcodone-acetaminophen (NORCO) 10-325 MG tablet; Take 1 tablet by mouth every 8 (eight) hours as needed.  Dispense: 90 tablet; Refill: 0 - nabumetone (RELAFEN) 500 MG tablet; Take 1 tablet (500 mg total) by mouth 2 (two) times daily.  Dispense: 180 tablet; Refill: 0  8. Type 2 diabetes mellitus without complication, without long-term current use of insulin (HCC) Well-controlled diabetes with A1c of 6.9%, no changes pharmacotherapy - POCT HgB A1C - POCT Glucose (CBG) - glipiZIDE (GLUCOTROL) 5 MG tablet; Take 1 tablet (5 mg total) by mouth daily.  Dispense: 90 tablet; Refill: 0 - metFORMIN (GLUCOPHAGE-XR) 500 MG 24 hr tablet; Take 2 tablets (1,000 mg total) by mouth 2 (two) times daily. 1000mg  BID  Dispense: 360 tablet; Refill: 1   Klinton Candelas [facility] 06/09/2016 8:32 AM

## 2016-06-10 LAB — LIPID PANEL W/O CHOL/HDL RATIO
Cholesterol, Total: 145 mg/dL (ref 100–199)
HDL: 37 mg/dL — ABNORMAL LOW (ref >39)
LDL CALC: 87 mg/dL (ref 0–99)
Triglycerides: 106 mg/dL (ref 0–149)
VLDL CHOLESTEROL CAL: 21 mg/dL (ref 5–40)

## 2016-06-13 ENCOUNTER — Ambulatory Visit: Payer: Medicare PPO

## 2016-06-15 ENCOUNTER — Ambulatory Visit: Payer: Medicare PPO

## 2016-06-15 DIAGNOSIS — M25562 Pain in left knee: Secondary | ICD-10-CM | POA: Diagnosis not present

## 2016-06-15 DIAGNOSIS — R262 Difficulty in walking, not elsewhere classified: Secondary | ICD-10-CM

## 2016-06-15 DIAGNOSIS — M6281 Muscle weakness (generalized): Secondary | ICD-10-CM | POA: Diagnosis not present

## 2016-06-15 NOTE — Therapy (Signed)
[facility] [place] [address], [place], [address] Phone: [phone]   Fax:  [phone]  Physical Therapy Treatment  Patient Details  Name: Evan Rivas MRN: [id number] Date of Birth: April 23, 1950 Referring Provider: Gaynelle Arabian, MD  Encounter Date: 06/15/2016      PT End of Session - 06/15/16 1459    Visit Number 8   Number of Visits 25   Date for PT Re-Evaluation 07/13/16   Authorization Type 8   Authorization Time Period of  10 g code   PT Start Time 6962   PT Stop Time 1538   PT Time Calculation (min) 39 min   Equipment Utilized During Treatment --  pt SPC   Activity Tolerance Patient tolerated treatment well   Behavior During Therapy [facility] for tasks assessed/performed      Past Medical History:  Diagnosis Date  . Anxiety    panic attacks  . Arthritis   . Cancer (HCC)    lip  . Depression   . Diabetes mellitus without complication ([place])    type 2  . Gout   . History of kidney stones   . Hyperlipidemia   . Hypertension   . Pneumonia    2014    Past Surgical History:  Procedure Laterality Date  . ARTHROSCOPIC REPAIR ACL Left   . CYSTOSCOPY W/ URETERAL STENT PLACEMENT Right 09/07/2014   Procedure: CYSTOSCOPY WITH STENT REPLACEMENT;  Surgeon: Hollice Espy, MD;  Location: [facility];  Service: Urology;  Laterality: Right;  . CYSTOSCOPY/URETEROSCOPY/HOLMIUM LASER/STENT PLACEMENT Right 08/17/2014   Procedure: CYSTOSCOPY/URETEROSCOPY//STENT PLACEMENT/ attempt of lithotripsy;  Surgeon: Hollice Espy, MD;  Location: [facility];  Service: Urology;  Laterality: Right;  . HERNIA REPAIR    . MOHS SURGERY     lip  . TOTAL KNEE ARTHROPLASTY Left 04/24/2016   Procedure: LEFT TOTAL KNEE ARTHROPLASTY;  Surgeon: Gaynelle Arabian, MD;  Location: [facility];  Service: Orthopedics;  Laterality: Left;  Adductor Block  . URETEROSCOPY WITH HOLMIUM LASER LITHOTRIPSY Right 09/07/2014   Procedure: URETEROSCOPY WITH HOLMIUM  LASER LITHOTRIPSY;  Surgeon: Hollice Espy, MD;  Location: [facility];  Service: Urology;  Laterality: Right;  [name] VASECTOMY      There were no vitals filed for this visit.      Subjective Assessment - 06/15/16 1504    Subjective Pt stated L knee feels sore today. Worked it pretty hard yesterday.    Pertinent History S/P L TKA 04/24/2016 secondary to L knee pain and arthritis. Pt states his knee was bone on bone. Has been dealing with L knee pain for a long time. Went to rehab around 1995 which helped as much as it could. Currently having R knee pain as well and plans to get it replaced sometime this year. Had 7 sessions of home health PT which involved knee extension and flexion stretches, supine and standing hip abduction, standing heel raises. Uses a stool to get into and out of bed (high bed at home).  Prior to his procedure, pt was walking independently but with a lot of pain.    Patient Stated Goals Be able to walk the best I can, play a little golf.    Currently in Pain? No/denies   Pain Score 0-No pain   Pain Onset 1 to 4 weeks ago  surgical pain  PT Education - 06/15/16 1557    Education provided Yes   Education Details ther-ex, plan of care   Person(s) Educated Patient   Methods Explanation;Demonstration;Tactile cues;Verbal cues   Comprehension Returned demonstration;Verbalized understanding        Objectives  S/P 7.5 weeks     Seated L knee AROM -15 degrees to 105 degrees at start of session    Manual therapy  Supine STM to hamstrings, leg supported by pillow to promote knee extension ROM     There-ex   Reviewed plan of care: 30 min sessions working mostly on manual therapy and hands on treatment/exercises by PT and pt do HEP after session.  Directed patient with supine quad set with gentle over pressure 10x5 seconds for 3 sets (following STM to hamstrings in supine  Supine knee extension in  quad set position -13 degrees  Seated L knee flexion PROM by PT 10x3 Followed by [name] with PT assist 10x4 110 seated L knee flexion AROM (112 AAROM)   (might be able to perform 45 min treatment since copay is $40 whether or not the sessions are 30 min or 45 min after reviewing insurance with one of the front desk staff after session)     Improved exercise technique, movement at target joints, use of target muscles after mod verbal, visual, tactile cues.    Per pt concern about insurance and cost, PT sessions decreased to 1x/week for 30 min with emphasis on manual therapy and hands on treatment/exercise by PT such as seated L knee flexion PROM/AAROM. However if pt demonstrates increased stiffness/decreased ROM, sessions will be returned to 2x/week to maintain progress. Last session pt was seen was week ago with patient demonstrates very good carry over of ROM gained from previous sessions. Based on pt reports pt consistent on performing his exercises at home and at the gym. Pt improved to 110 degrees seated L knee flexion AROM after PROM/AAROM with PT assist. Pt able to achieve -13 degrees L knee extension in supine quad set position. Similar ROM to seated knee extension. Joint stiffness, hamstring tightness and hx of limited knee extension ROM (per pt and pt also demonstrates limited knee extension AROM on non-surgical side) may play a factor on knee extension ROM limitation. Pt demonstrates steady improvements with knee flexion ROM.          PT Long Term Goals - 05/15/16 1927      PT LONG TERM GOAL #1   Title Patient will improve L knee extension seated AROM to at least -5 degrees to promote ability to ambulate and perform standing tasks.    Baseline Seated L knee extension AROM -19 degrees (05/15/2016)   Time 8   Period Weeks   Status New     PT LONG TERM GOAL #2   Title Patient will improve L knee flexion seated AROM to at least 115 degrees to  promote ability to ambulate, get into and out of cars, negotiate stairs, perform standing tassks.    Baseline Seated L knee flexion AROM 71 degrees (05/15/2016)   Time 8   Period Weeks   Status New     PT LONG TERM GOAL #3   Title Patient will improve his LEFS score by at least 9 points as a demonstration of improved function.    Baseline 11/80 (05/15/2016)   Time 8   Period Weeks   Status New     PT LONG TERM GOAL #4   Title Patient will be able to  ambulate at least 500 ft without AD to promote mobility.    Baseline Pt currently ambulates with rw.    Time 8   Period Weeks   Status New     PT LONG TERM GOAL #5   Title Patient will have 5/5 L knee extension strength and at least 4+/5 L knee flexion strength to promote ability to ambulate as well as to perform functional tasks.    Baseline L knee flexion and extension strength not yet tested to allow for more healing since pt is only 3 weeks post op.    Time 8   Period Weeks   Status New               Plan - 06/15/16 1557    Clinical Impression Statement Per pt concern about insurance and cost, PT sessions decreased to 1x/week for 30 min with emphasis on manual therapy and hands on treatment/exercise by PT such as seated L knee flexion PROM/AAROM. However if pt demonstrates increased stiffness/decreased ROM, sessions will be returned to 2x/week to maintain progress. Last session pt was seen was week ago with patient demonstrates very good carry over of ROM gained from previous sessions. Based on pt reports pt consistent on performing his exercises at home and at the gym. Pt improved to 110 degrees seated L knee flexion AROM after PROM/AAROM with PT assist. Pt able to achieve -13 degrees L knee extension in supine quad set position. Similar ROM to seated knee extension. Joint stiffness, hamstring tightness and hx of limited knee extension ROM (per pt and pt also demonstrates limited knee extension AROM on non-surgical side) may play a  factor on knee extension ROM limitation. Pt demonstrates steady improvements with knee flexion ROM.    Rehab Potential Good   Clinical Impairments Affecting Rehab Potential pain, swelling, healing time   PT Frequency 2x / week  2-3x/week   PT Duration 8 weeks   PT Treatment/Interventions Manual techniques;Therapeutic exercise;Therapeutic activities;Aquatic Therapy;Electrical Stimulation;Iontophoresis 4mg /ml Dexamethasone;Gait training;Stair training;Neuromuscular re-education;Patient/family education;Dry needling   PT Next Visit Plan ROM, manual techniques PRN, gait, gentle strengthening when appropriate   Consulted and Agree with Plan of Care Patient      Patient will benefit from skilled therapeutic intervention in order to improve the following deficits and impairments:  Pain, Decreased balance, Decreased range of motion, Decreased strength, Difficulty walking, Hypomobility, Improper body mechanics, Postural dysfunction  Visit Diagnosis: Difficulty in walking, not elsewhere classified  Muscle weakness (generalized)  Left knee pain, unspecified chronicity     Problem List Patient Active Problem List   Diagnosis Date Noted  . BPH without urinary obstruction 06/03/2015  . Depression 03/05/2015  . Hyperlipidemia 09/03/2014  . Major depressive disorder, recurrent episode, moderate ([place]) 09/03/2014  . Chronic low back pain 09/03/2014  . Arthralgia of both knees 09/03/2014  . Type 2 diabetes mellitus without complication ([place]) 16/10/3708  . Allergic rhinitis 07/04/2014  . Anxiety 07/04/2014  . Continuous opioid dependence ([place]) 07/04/2014  . DDD (degenerative disc disease), lumbar 07/04/2014  . Gout of foot 07/04/2014  . BP (high blood pressure) 07/04/2014  . Flu vaccine need 07/04/2014  . Adiposity 07/04/2014  . OA (osteoarthritis) of knee 07/04/2014  . Neuralgia neuritis, sciatic nerve 07/04/2014  . Arthralgia of multiple joints 07/04/2014  . H/O malignant neoplasm of skin  12/24/2013   Joneen Boers PT, DPT   06/15/2016, 4:56 PM  [place] [facility] [address], [place], [address]  Phone: [phone]   Fax:  [phone]  Name: Evan Rivas MRN: [id number] Date of Birth: [birth year]

## 2016-06-20 ENCOUNTER — Ambulatory Visit: Payer: Medicare PPO

## 2016-06-22 ENCOUNTER — Ambulatory Visit: Payer: Medicare PPO

## 2016-06-22 DIAGNOSIS — M25562 Pain in left knee: Secondary | ICD-10-CM

## 2016-06-22 DIAGNOSIS — M6281 Muscle weakness (generalized): Secondary | ICD-10-CM

## 2016-06-22 DIAGNOSIS — R262 Difficulty in walking, not elsewhere classified: Secondary | ICD-10-CM | POA: Diagnosis not present

## 2016-06-22 NOTE — Therapy (Signed)
[facility] [address], [place], [address] Phone: [phone]   Fax:  [phone]  Physical Therapy Treatment  Patient Details  Name: Evan Rivas MRN: [id number] Date of Birth: 05-10-1950 Referring Provider: Gaynelle Arabian, MD  Encounter Date: 06/22/2016      PT End of Session - 06/22/16 1637    Visit Number 9   Number of Visits 25   Date for PT Re-Evaluation 07/13/16   Authorization Type 9   Authorization Time Period of  10 g code   PT Start Time 3614   PT Stop Time 1711   PT Time Calculation (min) 34 min   Equipment Utilized During Treatment --  pt SPC   Activity Tolerance Patient tolerated treatment well   Behavior During Therapy [facility] for tasks assessed/performed      Past Medical History:  Diagnosis Date  . Anxiety    panic attacks  . Arthritis   . Cancer (HCC)    lip  . Depression   . Diabetes mellitus without complication ([place])    type 2  . Gout   . History of kidney stones   . Hyperlipidemia   . Hypertension   . Pneumonia    2014    Past Surgical History:  Procedure Laterality Date  . ARTHROSCOPIC REPAIR ACL Left   . CYSTOSCOPY W/ URETERAL STENT PLACEMENT Right 09/07/2014   Procedure: CYSTOSCOPY WITH STENT REPLACEMENT;  Surgeon: Hollice Espy, MD;  Location: [facility];  Service: Urology;  Laterality: Right;  . CYSTOSCOPY/URETEROSCOPY/HOLMIUM LASER/STENT PLACEMENT Right 08/17/2014   Procedure: CYSTOSCOPY/URETEROSCOPY//STENT PLACEMENT/ attempt of lithotripsy;  Surgeon: Hollice Espy, MD;  Location: [facility];  Service: Urology;  Laterality: Right;  . HERNIA REPAIR    . MOHS SURGERY     lip  . TOTAL KNEE ARTHROPLASTY Left 04/24/2016   Procedure: LEFT TOTAL KNEE ARTHROPLASTY;  Surgeon: Gaynelle Arabian, MD;  Location: [facility];  Service: Orthopedics;  Laterality: Left;  Adductor Block  . URETEROSCOPY WITH HOLMIUM LASER LITHOTRIPSY Right 09/07/2014   Procedure: URETEROSCOPY WITH HOLMIUM  LASER LITHOTRIPSY;  Surgeon: Hollice Espy, MD;  Location: [facility];  Service: Urology;  Laterality: Right;  [name] VASECTOMY      There were no vitals filed for this visit.      Subjective Assessment - 06/22/16 1638    Subjective Pt states that he has not met his deductible yet. L knee feels good. A little tired but good. No pain.  Has an ache along the incision line.  Went to the gym back to back Monday and Tuesday.    Pertinent History S/P L TKA 04/24/2016 secondary to L knee pain and arthritis. Pt states his knee was bone on bone. Has been dealing with L knee pain for a long time. Went to rehab around 1995 which helped as much as it could. Currently having R knee pain as well and plans to get it replaced sometime this year. Had 7 sessions of home health PT which involved knee extension and flexion stretches, supine and standing hip abduction, standing heel raises. Uses a stool to get into and out of bed (high bed at home).  Prior to his procedure, pt was walking independently but with a lot of pain.    Patient Stated Goals Be able to walk the best I can, play a little golf.    Currently in Pain? No/denies   Pain Score 0-No pain   Pain Onset 1 to 4 weeks ago  surgical pain            [facility] PT Assessment - 06/22/16 1711      Strength   Left Knee Flexion 4/5   Left Knee Extension 5/5                             PT Education - 06/22/16 1722    Education provided Yes   Education Details ther-ex   Person(s) Educated Patient   Methods Explanation;Demonstration;Tactile cues;Verbal cues   Comprehension Returned demonstration;Verbalized understanding        Objectives  S/P 8.5 weeks     Seated L knee AROM -18degrees to 111 degrees at start of session    There-ex   Supine medial glide to L patella grade 3- to promote patellar mobility during exercises and movement  supine quad set with gentle over pressure 10x5 seconds for 3 sets  -16  degrees seated L knee extension AROM afterwards  Gait without SPC 200 ft. No LOB  Seated L knee flexion PROM by PT 10x3 Followed by [name] with PT assist 10x4  113seated L knee flexion AROM (115 AAROM)  Seated manually resisted L knee flexion and extension 1x each way   30 min treat secondary to deductible and pt insurance concerns.     Improved exercise technique, movement at target joints, use of target muscles after min to mod verbal, visual, tactile cues.     Good carryover of ROM from previous session. Pt consistent with HEP. Pt also able to ambulate 200 ft without use of AD and no LOB. Pt making very good progress with PT with ROM and function.         PT Long Term Goals - 05/15/16 1927      PT LONG TERM GOAL #1   Title Patient will improve L knee extension seated AROM to at least -5 degrees to promote ability to ambulate and perform standing tasks.    Baseline Seated L knee extension AROM -19 degrees (05/15/2016)   Time 8   Period Weeks   Status New     PT LONG TERM GOAL #2   Title Patient will improve L knee flexion seated AROM to at least 115 degrees to promote ability to ambulate, get into and out of cars, negotiate stairs, perform standing tassks.    Baseline Seated L knee flexion AROM 71 degrees (05/15/2016)   Time 8   Period Weeks   Status New     PT LONG TERM GOAL #3   Title Patient will improve his LEFS score by at least 9 points as a demonstration of improved function.    Baseline 11/80 (05/15/2016)   Time 8   Period Weeks   Status New     PT LONG TERM GOAL #4   Title Patient will be able to ambulate at least 500 ft without AD to promote mobility.    Baseline Pt currently ambulates with rw.    Time 8   Period Weeks   Status New     PT LONG TERM GOAL #5   Title Patient will have 5/5 L knee extension strength and at least 4+/5 L knee flexion strength to promote ability to ambulate as well as to perform  functional tasks.    Baseline L knee flexion and extension strength not yet tested to allow for more healing since pt is only 3 weeks post op.    Time 8   Period  Weeks   Status New               Plan - 06/22/16 1724    Clinical Impression Statement Good carryover of ROM from previous session. Pt consistent with HEP. Pt also able to ambulate 200 ft without use of AD and no LOB. Pt making very good progress with PT with ROM and function.     Rehab Potential Good   Clinical Impairments Affecting Rehab Potential pain, swelling, healing time   PT Frequency 2x / week  2-3x/week   PT Duration 8 weeks   PT Treatment/Interventions Manual techniques;Therapeutic exercise;Therapeutic activities;Aquatic Therapy;Electrical Stimulation;Iontophoresis 28m/ml Dexamethasone;Gait training;Stair training;Neuromuscular re-education;Patient/family education;Dry needling   PT Next Visit Plan ROM, manual techniques PRN, gait, gentle strengthening when appropriate   Consulted and Agree with Plan of Care Patient      Patient will benefit from skilled therapeutic intervention in order to improve the following deficits and impairments:  Pain, Decreased balance, Decreased range of motion, Decreased strength, Difficulty walking, Hypomobility, Improper body mechanics, Postural dysfunction  Visit Diagnosis: Left knee pain, unspecified chronicity  Difficulty in walking, not elsewhere classified  Muscle weakness (generalized)     Problem List Patient Active Problem List   Diagnosis Date Noted  . BPH without urinary obstruction 06/03/2015  . Depression 03/05/2015  . Hyperlipidemia 09/03/2014  . Major depressive disorder, recurrent episode, moderate ([place] 09/03/2014  . Chronic low back pain 09/03/2014  . Arthralgia of both knees 09/03/2014  . Type 2 diabetes mellitus without complication ([place] 057/50/5183 . Allergic rhinitis 07/04/2014  . Anxiety 07/04/2014  . Continuous opioid dependence ([place]  07/04/2014  . DDD (degenerative disc disease), lumbar 07/04/2014  . Gout of foot 07/04/2014  . BP (high blood pressure) 07/04/2014  . Flu vaccine need 07/04/2014  . Adiposity 07/04/2014  . OA (osteoarthritis) of knee 07/04/2014  . Neuralgia neuritis, sciatic nerve 07/04/2014  . Arthralgia of multiple joints 07/04/2014  . H/O malignant neoplasm of skin 12/24/2013    MJoneen BoersPT, DPT   06/22/2016, 5:31 PM  [facility] [address] [place] [address]Phone: [phone]  Fax:  [phone] Name: MDOVID BARTKOMRN: [id number] of Birth: 803/03/1951

## 2016-06-28 ENCOUNTER — Telehealth: Payer: Self-pay | Admitting: Family Medicine

## 2016-06-28 NOTE — Telephone Encounter (Signed)
Pt was prescribed clonazepam on 06-09-16 and did not receive it from the pharmacy. When he called to check status they told him that they cancelled the order due to no response from provider. They are needing clarification. Please call [place]

## 2016-06-29 ENCOUNTER — Ambulatory Visit: Payer: Medicare PPO

## 2016-06-29 NOTE — Telephone Encounter (Signed)
Spoke to [employer], Script was on file directions had to be to verified. Will be sending script out

## 2016-07-11 ENCOUNTER — Telehealth: Payer: Self-pay | Admitting: Family Medicine

## 2016-07-11 NOTE — Telephone Encounter (Signed)
PT NEEDS REFILL ON HIS HYRODCODONE 10-325 MG

## 2016-07-12 ENCOUNTER — Other Ambulatory Visit: Payer: Self-pay | Admitting: Emergency Medicine

## 2016-07-12 DIAGNOSIS — M170 Bilateral primary osteoarthritis of knee: Secondary | ICD-10-CM

## 2016-07-12 MED ORDER — HYDROCODONE-ACETAMINOPHEN 10-325 MG PO TABS: 1.0000 | ORAL_TABLET | Freq: Three times a day (TID) | ORAL | 0 refills | Status: DC | PRN

## 2016-07-12 NOTE — Telephone Encounter (Signed)
Script printed, ready for pick up

## 2016-07-18 ENCOUNTER — Ambulatory Visit (INDEPENDENT_AMBULATORY_CARE_PROVIDER_SITE_OTHER): Payer: Medicare PPO | Admitting: Family Medicine

## 2016-07-18 ENCOUNTER — Other Ambulatory Visit: Payer: Self-pay | Admitting: Family Medicine

## 2016-07-18 ENCOUNTER — Ambulatory Visit: Payer: Medicare PPO | Admitting: Family Medicine

## 2016-07-18 ENCOUNTER — Encounter: Payer: Self-pay | Admitting: Family Medicine

## 2016-07-18 ENCOUNTER — Telehealth: Payer: Self-pay | Admitting: Family Medicine

## 2016-07-18 VITALS — BP 164/90 | HR 114 | Temp 99.1°F | Resp 18 | Ht 74.0 in | Wt 254.9 lb

## 2016-07-18 DIAGNOSIS — R6889 Other general symptoms and signs: Secondary | ICD-10-CM

## 2016-07-18 DIAGNOSIS — R509 Fever, unspecified: Secondary | ICD-10-CM | POA: Diagnosis not present

## 2016-07-18 DIAGNOSIS — R112 Nausea with vomiting, unspecified: Secondary | ICD-10-CM

## 2016-07-18 DIAGNOSIS — R05 Cough: Secondary | ICD-10-CM | POA: Diagnosis not present

## 2016-07-18 LAB — POCT INFLUENZA A/B
INFLUENZA A, POC: NEGATIVE
INFLUENZA B, POC: NEGATIVE

## 2016-07-18 MED ORDER — ONDANSETRON HCL 4 MG PO TABS: 4.0000 mg | ORAL_TABLET | Freq: Three times a day (TID) | ORAL | 0 refills | Status: DC | PRN

## 2016-07-18 MED ORDER — ONDANSETRON 4 MG PO TBDP: 4.0000 mg | ORAL_TABLET | Freq: Three times a day (TID) | ORAL | Status: DC | PRN

## 2016-07-18 MED ORDER — OSELTAMIVIR PHOSPHATE 75 MG PO CAPS: 75.0000 mg | ORAL_CAPSULE | Freq: Two times a day (BID) | ORAL | 0 refills | Status: DC

## 2016-07-18 NOTE — Progress Notes (Addendum)
Name: Evan Rivas   MRN: [id number]    DOB: 1951/03/04   Date:07/18/2016       Progress Note  Subjective  Chief Complaint  Chief Complaint  Patient presents with  . Influenza    fever, bodyaches, nasal drainage,     HPI  Pt presents with c/o flu-like symptoms that began yesterday. Reports waking up and vomiting yesterday and has had 4 episodes total; also endorses fevers at home (Took tylenol at 0830 today - Temp in office is 99.1), chills, nasal congestion, sore throat, non-productive cough, body aches, loose stools. Pt has not been taking his medications over the last two days (BP in office is 164/90), but has been drinking plenty of fluids and ate a [facility] this morning without nausea or vomiting.  Patient Active Problem List   Diagnosis Date Noted  . BPH without urinary obstruction 06/03/2015  . Depression 03/05/2015  . Hyperlipidemia 09/03/2014  . Major depressive disorder, recurrent episode, moderate ([place]) 09/03/2014  . Chronic low back pain 09/03/2014  . Arthralgia of both knees 09/03/2014  . Type 2 diabetes mellitus without complication ([place]) 36/64/4034  . Allergic rhinitis 07/04/2014  . Anxiety 07/04/2014  . Continuous opioid dependence ([place]) 07/04/2014  . DDD (degenerative disc disease), lumbar 07/04/2014  . Gout of foot 07/04/2014  . BP (high blood pressure) 07/04/2014  . Flu vaccine need 07/04/2014  . Adiposity 07/04/2014  . OA (osteoarthritis) of knee 07/04/2014  . Neuralgia neuritis, sciatic nerve 07/04/2014  . Arthralgia of multiple joints 07/04/2014  . H/O malignant neoplasm of skin 12/24/2013    Social History  Substance Use Topics  . Smoking status: Never Smoker  . Smokeless tobacco: Never Used  . Alcohol use No     Current Outpatient Prescriptions:  .  allopurinol (ZYLOPRIM) 300 MG tablet, Take 1 tablet (300 mg total) by mouth every evening., Disp: 90 tablet, Rfl: 0 .  amLODipine (NORVASC) 10 MG tablet, Take 1 tablet (10 mg total) by  mouth daily., Disp: 90 tablet, Rfl: 1 .  aspirin 81 MG tablet, Take 81 mg by mouth daily., Disp: , Rfl:  .  atorvastatin (LIPITOR) 40 MG tablet, Take 1 tablet (40 mg total) by mouth at bedtime., Disp: 90 tablet, Rfl: 0 .  clonazePAM (KLONOPIN) 1 MG tablet, Take 1 tablet (1 mg total) by mouth 2 (two) times daily as needed., Disp: 180 tablet, Rfl: 0 .  FLUoxetine (PROZAC) 40 MG capsule, Take 1 capsule (40 mg total) by mouth every morning., Disp: 90 capsule, Rfl: 1 .  fluticasone (FLONASE) 50 MCG/ACT nasal spray, Place 2 sprays into both nostrils daily. 2 sprays (Patient taking differently: Place 2 sprays into both nostrils daily as needed (for allergies/sinuses). ), Disp: 16 g, Rfl: 0 .  glipiZIDE (GLUCOTROL) 5 MG tablet, Take 1 tablet (5 mg total) by mouth daily., Disp: 90 tablet, Rfl: 0 .  HYDROcodone-acetaminophen (NORCO) 10-325 MG tablet, Take 1 tablet by mouth every 8 (eight) hours as needed., Disp: 90 tablet, Rfl: 0 .  losartan (COZAAR) 100 MG tablet, Take 1 tablet (100 mg total) by mouth at bedtime., Disp: 90 tablet, Rfl: 0 .  metFORMIN (GLUCOPHAGE-XR) 500 MG 24 hr tablet, Take 2 tablets (1,000 mg total) by mouth 2 (two) times daily. 1054m BID, Disp: 360 tablet, Rfl: 1 .  metoprolol succinate (TOPROL-XL) 100 MG 24 hr tablet, Take 1 tablet (100 mg total) by mouth every morning., Disp: 90 tablet, Rfl: 0 .  metoprolol succinate (TOPROL-XL) 25 MG 24 hr  tablet, Take 1 tablet (25 mg total) by mouth every morning., Disp: 90 tablet, Rfl: 1 .  Multiple Vitamin (MULTIVITAMINS PO), Take by mouth., Disp: , Rfl:  .  nabumetone (RELAFEN) 500 MG tablet, Take 1 tablet (500 mg total) by mouth 2 (two) times daily., Disp: 180 tablet, Rfl: 0 .  tamsulosin (FLOMAX) 0.4 MG CAPS capsule, Take 1 capsule (0.4 mg total) by mouth daily after supper., Disp: 90 capsule, Rfl: 0 .  tiZANidine (ZANAFLEX) 4 MG tablet, Take 1 tablet (4 mg total) by mouth at bedtime., Disp: 90 tablet, Rfl: 0  Allergies  Allergen Reactions  .  2,4-D Dimethylamine (Amisol) Nausea And Vomiting    Pt is not aware of this  . Celebrex  [Celecoxib]     GI  . Tetanus Toxoids Other (See Comments)    unkown  . Tetanus-Diphtheria Toxoids Td     Other reaction(s): Unknown    ROS  Constitutional: Positive for fever and weight change (Pt lost 8lbs since last visit - states has been exercising more).  Respiratory: Positive for cough and negative for shortness of breath.   Cardiovascular: Negative for chest pain or palpitations.  Gastrointestinal: Negative for abdominal pain; Positive for loose stool and nausea/vomiting. Musculoskeletal: Negative for gait problem or joint swelling. Endorses generalized body aches relieved with Tylenol. Skin: Negative for rash.  Neurological: Negative for dizziness or headache.  No other specific complaints in a complete review of systems (except as listed in HPI above).  Objective  Vitals:   07/18/16 1016  BP: (!) 164/90  Pulse: (!) 114  Resp: 18  Temp: 99.1 F (37.3 C)  TempSrc: Oral  SpO2: 94%  Weight: 254 lb 14.4 oz (115.6 kg)  Height: _0  (1.88 m)    Body mass index is 32.73 kg/m.  Nursing Note and Vital Signs reviewed.  Physical Exam  Constitutional: Patient appears well-developed and well-nourished. Obese, No distress.  HEENT: head atraumatic, normocephalic, pupils equal and reactive to light, TM's without erythema or bulging, no maxillary or frontal sinus pain on palpation, neck supple without lymphadenopathy, oropharynx mildly erythematous and moist without exudate Cardiovascular: Rate 109bpm, regular rhythm, S1/S2 present.  No murmur or rub heard. No BLE edema. Pulmonary/Chest: Effort normal and breath sounds clear bilaterally. No respiratory distress or retractions. Abdominal: Soft and non-tender, bowel sounds present x4 quadrants. Psychiatric: Patient has a normal mood and affect. behavior is normal. Judgment and thought content normal.  Recent Results (from the past 2160  hour(s))  Glucose, capillary     Status: Abnormal   Collection Time: 04/24/16  8:29 AM  Result Value Ref Range   Glucose-Capillary 167 (H) 65 - 99 mg/dL   Comment 1 Notify RN   Glucose, capillary     Status: Abnormal   Collection Time: 04/24/16  1:32 PM  Result Value Ref Range   Glucose-Capillary 177 (H) 65 - 99 mg/dL  Glucose, capillary     Status: Abnormal   Collection Time: 04/24/16  5:28 PM  Result Value Ref Range   Glucose-Capillary 205 (H) 65 - 99 mg/dL  Glucose, capillary     Status: Abnormal   Collection Time: 04/24/16  9:00 PM  Result Value Ref Range   Glucose-Capillary 214 (H) 65 - 99 mg/dL  CBC     Status: Abnormal   Collection Time: 04/25/16  4:14 AM  Result Value Ref Range   WBC 10.6 (H) 4.0 - 10.5 K/uL   RBC 4.29 4.22 - 5.81 MIL/uL   Hemoglobin 13.3 13.0 -  17.0 g/dL   HCT 38.9 (L) 39.0 - 52.0 %   MCV 90.7 78.0 - 100.0 fL   MCH 31.0 26.0 - 34.0 pg   MCHC 34.2 30.0 - 36.0 g/dL   RDW 13.2 11.5 - 15.5 %   Platelets 199 150 - 400 K/uL  Basic metabolic panel     Status: Abnormal   Collection Time: 04/25/16  4:14 AM  Result Value Ref Range   Sodium 140 135 - 145 mmol/L   Potassium 3.5 3.5 - 5.1 mmol/L   Chloride 104 101 - 111 mmol/L   CO2 28 22 - 32 mmol/L   Glucose, Bld 189 (H) 65 - 99 mg/dL   BUN 15 6 - 20 mg/dL   Creatinine, Ser 0.94 0.61 - 1.24 mg/dL   Calcium 8.3 (L) 8.9 - 10.3 mg/dL   GFR calc non Af Amer >60 >60 mL/min   GFR calc Af Amer >60 >60 mL/min    Comment: (NOTE) The eGFR has been calculated using the CKD EPI equation. This calculation has not been validated in all clinical situations. eGFR's persistently <60 mL/min signify possible Chronic Kidney Disease.    Anion gap 8 5 - 15  Glucose, capillary     Status: Abnormal   Collection Time: 04/25/16  7:33 AM  Result Value Ref Range   Glucose-Capillary 172 (H) 65 - 99 mg/dL  Glucose, capillary     Status: Abnormal   Collection Time: 04/25/16 12:00 PM  Result Value Ref Range   Glucose-Capillary  218 (H) 65 - 99 mg/dL  Glucose, capillary     Status: Abnormal   Collection Time: 04/25/16  4:57 PM  Result Value Ref Range   Glucose-Capillary 204 (H) 65 - 99 mg/dL  CBC     Status: Abnormal   Collection Time: 04/26/16  4:15 AM  Result Value Ref Range   WBC 10.0 4.0 - 10.5 K/uL   RBC 4.08 (L) 4.22 - 5.81 MIL/uL   Hemoglobin 12.6 (L) 13.0 - 17.0 g/dL   HCT 36.9 (L) 39.0 - 52.0 %   MCV 90.4 78.0 - 100.0 fL   MCH 30.9 26.0 - 34.0 pg   MCHC 34.1 30.0 - 36.0 g/dL   RDW 13.4 11.5 - 15.5 %   Platelets 173 150 - 400 K/uL  Basic metabolic panel     Status: Abnormal   Collection Time: 04/26/16  4:15 AM  Result Value Ref Range   Sodium 139 135 - 145 mmol/L   Potassium 2.9 (L) 3.5 - 5.1 mmol/L    Comment: DELTA CHECK NOTED   Chloride 103 101 - 111 mmol/L   CO2 25 22 - 32 mmol/L   Glucose, Bld 213 (H) 65 - 99 mg/dL   BUN 13 6 - 20 mg/dL   Creatinine, Ser 0.85 0.61 - 1.24 mg/dL   Calcium 8.3 (L) 8.9 - 10.3 mg/dL   GFR calc non Af Amer >60 >60 mL/min   GFR calc Af Amer >60 >60 mL/min    Comment: (NOTE) The eGFR has been calculated using the CKD EPI equation. This calculation has not been validated in all clinical situations. eGFR's persistently <60 mL/min signify possible Chronic Kidney Disease.    Anion gap 11 5 - 15  Glucose, capillary     Status: Abnormal   Collection Time: 04/26/16  7:50 AM  Result Value Ref Range   Glucose-Capillary 206 (H) 65 - 99 mg/dL  Glucose, capillary     Status: Abnormal   Collection Time: 04/26/16 11:45  AM  Result Value Ref Range   Glucose-Capillary 231 (H) 65 - 99 mg/dL  Basic metabolic panel     Status: Abnormal   Collection Time: 04/26/16  2:34 PM  Result Value Ref Range   Sodium 139 135 - 145 mmol/L   Potassium 3.4 (L) 3.5 - 5.1 mmol/L   Chloride 104 101 - 111 mmol/L   CO2 27 22 - 32 mmol/L   Glucose, Bld 235 (H) 65 - 99 mg/dL   BUN 14 6 - 20 mg/dL   Creatinine, Ser 0.97 0.61 - 1.24 mg/dL   Calcium 8.5 (L) 8.9 - 10.3 mg/dL   GFR calc non Af  Amer >60 >60 mL/min   GFR calc Af Amer >60 >60 mL/min    Comment: (NOTE) The eGFR has been calculated using the CKD EPI equation. This calculation has not been validated in all clinical situations. eGFR's persistently <60 mL/min signify possible Chronic Kidney Disease.    Anion gap 8 5 - 15  POCT HgB A1C     Status: None   Collection Time: 06/09/16  8:22 AM  Result Value Ref Range   Hemoglobin A1C 6.9   POCT Glucose (CBG)     Status: Abnormal   Collection Time: 06/09/16  8:23 AM  Result Value Ref Range   POC Glucose 165 (A) 70 - 99 mg/dl  Lipid Panel w/o Chol/HDL Ratio     Status: Abnormal   Collection Time: 06/09/16  9:09 AM  Result Value Ref Range   Cholesterol, Total 145 100 - 199 mg/dL   Triglycerides 106 0 - 149 mg/dL   HDL 37 (L) >39 mg/dL   VLDL Cholesterol Cal 21 5 - 40 mg/dL   LDL Calculated 87 0 - 99 mg/dL  POCT Influenza A/B     Status: None   Collection Time: 07/18/16 10:28 AM  Result Value Ref Range   Influenza A, POC Negative Negative   Influenza B, POC Negative Negative     Assessment & Plan  1. Flu-like symptoms - POCT Influenza A/B - Negative - CBC - oseltamivir (TAMIFLU) 75 MG capsule; Take 1 capsule (75 mg total) by mouth 2 (two) times daily.  Dispense: 10 capsule; Refill: 0 -Continue to Push fluids, advance diet as tolerated, begin taking daily medications again. -Monitor blood glucose and BP as discussed. -Take Tylenol PRN for pain or fevers - Do not exceed 3040m daily (including Norco) -Begin taking Flonase for nasal congestion. -Discussed with patient his higher risk for pneumonia due to his PMH of diabetes and previous pneumonia. He declines Chest X-ray today, but is agreeable to checking a CBC. If WBC is elevated, pt is willing to go for CXR.  2. Cough with fever - CBC  -Red flags and when to present for emergency care  including shortness of breath, chest pain, new/worsening/unresolving symptoms, and fever >101.42F without relief after  antipyretics, reviewed with patient at time of visit. Follow up and care instructions discussed and provided in AVS.  I have reviewed this encounter including the documentation in this note and/or discussed this patient with the pJohney Maine FNP, NP-C. I am certifying that I agree with the content of this note as supervising physician.  KSteele Sizer MD [facility] 07/20/2016, 8:04 AM

## 2016-07-18 NOTE — Patient Instructions (Addendum)
Please take Tylenol PRN for fever and/or body aches. Please do not exceed 3000mg  of Tylenol - including your regular pain medication (Norco) as discussed. Please continue to drink plenty of fluids, begin taking your daily medications again, and continue to monitor your blood sugar.   Viral Illness, Adult Viruses are tiny germs that can get into a person's body and cause illness. There are many different types of viruses, and they cause many types of illness. Viral illnesses can range from mild to severe. They can affect various parts of the body. Common illnesses that are caused by a virus include colds and the flu. Viral illnesses also include serious conditions such as HIV/AIDS (human immunodeficiency virus/acquired immunodeficiency syndrome). A few viruses have been linked to certain cancers. What are the causes? Many types of viruses can cause illness. Viruses invade cells in your body, multiply, and cause the infected cells to malfunction or die. When the cell dies, it releases more of the virus. When this happens, you develop symptoms of the illness, and the virus continues to spread to other cells. If the virus takes over the function of the cell, it can cause the cell to divide and grow out of control, as is the case when a virus causes cancer. Different viruses get into the body in different ways. You can get a virus by:  Swallowing food or water that is contaminated with the virus.  Breathing in droplets that have been coughed or sneezed into the air by an infected person.  Touching a surface that has been contaminated with the virus and then touching your eyes, nose, or mouth.  Being bitten by an insect or animal that carries the virus.  Having sexual contact with a person who is infected with the virus.  Being exposed to blood or fluids that contain the virus, either through an open cut or during a transfusion. If a virus enters your body, your body's defense system (immune system)  will try to fight the virus. You may be at higher risk for a viral illness if your immune system is weak. What are the signs or symptoms? Symptoms vary depending on the type of virus and the location of the cells that it invades. Common symptoms of the main types of viral illnesses include: Cold and flu viruses   Fever.  Headache.  Sore throat.  Muscle aches.  Nasal congestion.  Cough. Digestive system (gastrointestinal) viruses   Fever.  Abdominal pain.  Nausea.  Diarrhea. Liver viruses (hepatitis)   Loss of appetite.  Tiredness.  Yellowing of the skin (jaundice). Brain and spinal cord viruses   Fever.  Headache.  Stiff neck.  Nausea and vomiting.  Confusion or sleepiness. Skin viruses   Warts.  Itching.  Rash. Sexually transmitted viruses   Discharge.  Swelling.  Redness.  Rash. How is this treated? Viruses can be difficult to treat because they live within cells. Antibiotic medicines do not treat viruses because these drugs do not get inside cells. Treatment for a viral illness may include:  Resting and drinking plenty of fluids.  Medicines to relieve symptoms. These can include over-the-counter medicine for pain and fever, medicines for cough or congestion, and medicines to relieve diarrhea.  Antiviral medicines. These drugs are available only for certain types of viruses. They may help reduce flu symptoms if taken early. There are also many antiviral medicines for hepatitis and HIV/AIDS. Some viral illnesses can be prevented with vaccinations. A common example is the flu shot. Follow these instructions  at home: Medicines    Take over-the-counter and prescription medicines only as told by your health care provider.  If you were prescribed an antiviral medicine, take it as told by your health care provider. Do not stop taking the medicine even if you start to feel better.  Be aware of when antibiotics are needed and when they are not  needed. Antibiotics do not treat viruses. If your health care provider thinks that you may have a bacterial infection as well as a viral infection, you may get an antibiotic.  Do not ask for an antibiotic prescription if you have been diagnosed with a viral illness. That will not make your illness go away faster.  Frequently taking antibiotics when they are not needed can lead to antibiotic resistance. When this develops, the medicine no longer works against the bacteria that it normally fights. General instructions   Drink enough fluids to keep your urine clear or pale yellow.  Rest as much as possible.  Return to your normal activities as told by your health care provider. Ask your health care provider what activities are safe for you.  Keep all follow-up visits as told by your health care provider. This is important. How is this prevented? Take these actions to reduce your risk of viral infection:  Eat a healthy diet and get enough rest.  Wash your hands often with soap and water. This is especially important when you are in public places. If soap and water are not available, use hand sanitizer.  Avoid close contact with friends and family who have a viral illness.  If you travel to areas where viral gastrointestinal infection is common, avoid drinking water or eating raw food.  Keep your immunizations up to date. Get a flu shot every year as told by your health care provider.  Do not share toothbrushes, nail clippers, razors, or needles with other people.  Always practice safe sex. Contact a health care provider if:  You have symptoms of a viral illness that do not go away.  Your symptoms come back after going away.  Your symptoms get worse. Get help right away if:  You have trouble breathing.  You have a severe headache or a stiff neck.  You have severe vomiting or abdominal pain. This information is not intended to replace advice given to you by your health care  provider. Make sure you discuss any questions you have with your health care provider. Document Released: 07/30/2015 Document Revised: 09/01/2015 Document Reviewed: 07/30/2015 Elsevier Interactive Patient Education  2017 [employer].

## 2016-07-18 NOTE — Telephone Encounter (Signed)
Thank you for notifying me!  I called the patient and discussed the nausea/vomiting - he had one episode of emesis today, and has been able to eat soup and drink fluids without any issues after this episode.  I sent Zofran Rx and instructed patient to take only as needed Q8hours. Also advised patient to present for emergency care if vomiting does not stop/is unrelieved with Zofran or if he is unable to tolerate PO food/fluids.  He will call back for any other concerns.

## 2016-07-18 NOTE — Telephone Encounter (Signed)
Pt states that you had discussed giving him some pills that would not upset his stomach. He took the tamiflu today at 1:40 and it came back up. Please send to [facility] or give him a call [phone]

## 2016-07-19 ENCOUNTER — Ambulatory Visit
Admission: RE | Admit: 2016-07-19 | Discharge: 2016-07-19 | Disposition: A | Discharge status: Home / Self Care | Payer: Medicare PPO | Source: Ambulatory Visit | Attending: Family Medicine | Admitting: Family Medicine

## 2016-07-19 ENCOUNTER — Ambulatory Visit
Admission: RE | Admit: 2016-07-19 | Discharge: 2016-07-19 | Disposition: A | Discharge status: Home / Self Care | Payer: Medicare PPO | Source: Ambulatory Visit | Attending: *Deleted | Admitting: *Deleted

## 2016-07-19 ENCOUNTER — Encounter: Payer: Self-pay | Admitting: Family Medicine

## 2016-07-19 ENCOUNTER — Ambulatory Visit (INDEPENDENT_AMBULATORY_CARE_PROVIDER_SITE_OTHER): Payer: Medicare PPO | Admitting: Family Medicine

## 2016-07-19 ENCOUNTER — Other Ambulatory Visit: Payer: Self-pay | Admitting: Family Medicine

## 2016-07-19 ENCOUNTER — Inpatient Hospital Stay
Admission: AD | Admit: 2016-07-19 | Discharge: 2016-07-20 | DRG: 871 | Disposition: A | Discharge status: Home / Self Care | Payer: Medicare PPO | Source: Ambulatory Visit | Attending: Internal Medicine | Admitting: Internal Medicine

## 2016-07-19 ENCOUNTER — Other Ambulatory Visit
Admission: RE | Admit: 2016-07-19 | Discharge: 2016-07-19 | Disposition: A | Discharge status: Home / Self Care | Payer: Medicare PPO | Source: Ambulatory Visit | Attending: *Deleted | Admitting: *Deleted

## 2016-07-19 VITALS — BP 120/70 | HR 122 | Temp 99.3°F | Resp 18 | Ht 74.0 in | Wt 262.0 lb

## 2016-07-19 DIAGNOSIS — Z7984 Long term (current) use of oral hypoglycemic drugs: Secondary | ICD-10-CM

## 2016-07-19 DIAGNOSIS — A419 Sepsis, unspecified organism: Principal | ICD-10-CM | POA: Diagnosis present

## 2016-07-19 DIAGNOSIS — Z8249 Family history of ischemic heart disease and other diseases of the circulatory system: Secondary | ICD-10-CM

## 2016-07-19 DIAGNOSIS — R000 Tachycardia, unspecified: Secondary | ICD-10-CM | POA: Diagnosis not present

## 2016-07-19 DIAGNOSIS — E119 Type 2 diabetes mellitus without complications: Secondary | ICD-10-CM | POA: Diagnosis not present

## 2016-07-19 DIAGNOSIS — Z96652 Presence of left artificial knee joint: Secondary | ICD-10-CM | POA: Diagnosis present

## 2016-07-19 DIAGNOSIS — M791 Myalgia: Secondary | ICD-10-CM | POA: Diagnosis not present

## 2016-07-19 DIAGNOSIS — R05 Cough: Secondary | ICD-10-CM

## 2016-07-19 DIAGNOSIS — F410 Panic disorder [episodic paroxysmal anxiety] without agoraphobia: Secondary | ICD-10-CM | POA: Diagnosis present

## 2016-07-19 DIAGNOSIS — N179 Acute kidney failure, unspecified: Secondary | ICD-10-CM | POA: Diagnosis present

## 2016-07-19 DIAGNOSIS — Z7951 Long term (current) use of inhaled steroids: Secondary | ICD-10-CM | POA: Diagnosis not present

## 2016-07-19 DIAGNOSIS — I119 Hypertensive heart disease without heart failure: Secondary | ICD-10-CM | POA: Diagnosis present

## 2016-07-19 DIAGNOSIS — Z809 Family history of malignant neoplasm, unspecified: Secondary | ICD-10-CM | POA: Diagnosis not present

## 2016-07-19 DIAGNOSIS — D72829 Elevated white blood cell count, unspecified: Secondary | ICD-10-CM

## 2016-07-19 DIAGNOSIS — N400 Enlarged prostate without lower urinary tract symptoms: Secondary | ICD-10-CM | POA: Diagnosis present

## 2016-07-19 DIAGNOSIS — J189 Pneumonia, unspecified organism: Secondary | ICD-10-CM | POA: Diagnosis present

## 2016-07-19 DIAGNOSIS — J181 Lobar pneumonia, unspecified organism: Secondary | ICD-10-CM | POA: Diagnosis not present

## 2016-07-19 DIAGNOSIS — Z888 Allergy status to other drugs, medicaments and biological substances status: Secondary | ICD-10-CM

## 2016-07-19 DIAGNOSIS — Z87442 Personal history of urinary calculi: Secondary | ICD-10-CM | POA: Diagnosis not present

## 2016-07-19 DIAGNOSIS — M1A4720 Other secondary chronic gout, left ankle and foot, without tophus (tophi): Secondary | ICD-10-CM | POA: Diagnosis present

## 2016-07-19 DIAGNOSIS — F331 Major depressive disorder, recurrent, moderate: Secondary | ICD-10-CM | POA: Diagnosis not present

## 2016-07-19 DIAGNOSIS — E785 Hyperlipidemia, unspecified: Secondary | ICD-10-CM | POA: Diagnosis present

## 2016-07-19 DIAGNOSIS — E876 Hypokalemia: Secondary | ICD-10-CM | POA: Diagnosis not present

## 2016-07-19 DIAGNOSIS — Z7982 Long term (current) use of aspirin: Secondary | ICD-10-CM

## 2016-07-19 DIAGNOSIS — Z818 Family history of other mental and behavioral disorders: Secondary | ICD-10-CM | POA: Diagnosis not present

## 2016-07-19 DIAGNOSIS — R918 Other nonspecific abnormal finding of lung field: Secondary | ICD-10-CM | POA: Diagnosis not present

## 2016-07-19 DIAGNOSIS — Z23 Encounter for immunization: Secondary | ICD-10-CM | POA: Diagnosis not present

## 2016-07-19 DIAGNOSIS — M1A9XX0 Chronic gout, unspecified, without tophus (tophi): Secondary | ICD-10-CM | POA: Diagnosis present

## 2016-07-19 DIAGNOSIS — Z887 Allergy status to serum and vaccine status: Secondary | ICD-10-CM

## 2016-07-19 DIAGNOSIS — Z823 Family history of stroke: Secondary | ICD-10-CM | POA: Diagnosis not present

## 2016-07-19 DIAGNOSIS — I10 Essential (primary) hypertension: Secondary | ICD-10-CM | POA: Diagnosis not present

## 2016-07-19 DIAGNOSIS — M170 Bilateral primary osteoarthritis of knee: Secondary | ICD-10-CM | POA: Diagnosis present

## 2016-07-19 DIAGNOSIS — R509 Fever, unspecified: Principal | ICD-10-CM

## 2016-07-19 DIAGNOSIS — J9811 Atelectasis: Secondary | ICD-10-CM | POA: Diagnosis not present

## 2016-07-19 DIAGNOSIS — R6889 Other general symptoms and signs: Secondary | ICD-10-CM

## 2016-07-19 DIAGNOSIS — R059 Cough, unspecified: Secondary | ICD-10-CM

## 2016-07-19 LAB — CBC
HCT: 34.5 % — ABNORMAL LOW (ref 40.0–52.0)
HEMATOCRIT: 38.7 % (ref 37.5–51.0)
HEMATOCRIT: 36.4 % — AB (ref 40.0–52.0)
HEMOGLOBIN: 12.4 g/dL — AB (ref 13.0–18.0)
Hemoglobin: 12.9 g/dL — ABNORMAL LOW (ref 13.0–17.7)
Hemoglobin: 11.8 g/dL — ABNORMAL LOW (ref 13.0–18.0)
MCH: 31.1 pg (ref 26.6–33.0)
MCH: 30.4 pg (ref 26.0–34.0)
MCH: 30.6 pg (ref 26.0–34.0)
MCHC: 33.3 g/dL (ref 31.5–35.7)
MCHC: 34 g/dL (ref 32.0–36.0)
MCHC: 34.1 g/dL (ref 32.0–36.0)
MCV: 93 fL (ref 79–97)
MCV: 89.5 fL (ref 80.0–100.0)
MCV: 89.7 fL (ref 80.0–100.0)
PLATELETS: 269 10*3/uL (ref 150–379)
PLATELETS: 220 10*3/uL (ref 150–440)
Platelets: 260 10*3/uL (ref 150–440)
RBC: 4.15 x10E6/uL (ref 4.14–5.80)
RBC: 4.07 MIL/uL — ABNORMAL LOW (ref 4.40–5.90)
RBC: 3.84 MIL/uL — ABNORMAL LOW (ref 4.40–5.90)
RDW: 14.9 % (ref 12.3–15.4)
RDW: 15.1 % — AB (ref 11.5–14.5)
RDW: 14.7 % — AB (ref 11.5–14.5)
WBC: 21.6 10*3/uL — AB (ref 3.4–10.8)
WBC: 19.6 10*3/uL — ABNORMAL HIGH (ref 3.8–10.6)
WBC: 15.5 10*3/uL — ABNORMAL HIGH (ref 3.8–10.6)

## 2016-07-19 LAB — BASIC METABOLIC PANEL
Anion gap: 11 (ref 5–15)
BUN: 25 mg/dL — AB (ref 6–20)
CALCIUM: 8.1 mg/dL — AB (ref 8.9–10.3)
CHLORIDE: 100 mmol/L — AB (ref 101–111)
CO2: 23 mmol/L (ref 22–32)
CREATININE: 1.71 mg/dL — AB (ref 0.61–1.24)
GFR calc Af Amer: 47 mL/min — ABNORMAL LOW (ref >60)
GFR calc non Af Amer: 40 mL/min — ABNORMAL LOW (ref >60)
Glucose, Bld: 302 mg/dL — ABNORMAL HIGH (ref 65–99)
POTASSIUM: 2.4 mmol/L — AB (ref 3.5–5.1)
SODIUM: 134 mmol/L — AB (ref 135–145)

## 2016-07-19 LAB — GLUCOSE, CAPILLARY
Glucose-Capillary: 260 mg/dL — ABNORMAL HIGH (ref 65–99)
Glucose-Capillary: 261 mg/dL — ABNORMAL HIGH (ref 65–99)

## 2016-07-19 LAB — POCT INFLUENZA A/B
INFLUENZA B, POC: NEGATIVE
Influenza A, POC: NEGATIVE

## 2016-07-19 LAB — LACTIC ACID, PLASMA: Lactic Acid, Venous: 1.5 mmol/L (ref 0.5–1.9)

## 2016-07-19 LAB — MRSA PCR SCREENING: MRSA by PCR: NEGATIVE

## 2016-07-19 LAB — MAGNESIUM: Magnesium: 1.3 mg/dL — ABNORMAL LOW (ref 1.7–2.4)

## 2016-07-19 MED ORDER — ASPIRIN EC 81 MG PO TBEC
81.0000 mg | DELAYED_RELEASE_TABLET | Freq: Every day | ORAL | Status: DC
  Administered 2016-07-20: 81 mg via ORAL
  Filled 2016-07-19: qty 1

## 2016-07-19 MED ORDER — TAMSULOSIN HCL 0.4 MG PO CAPS
0.4000 mg | ORAL_CAPSULE | Freq: Every day | ORAL | Status: DC
  Administered 2016-07-19: 0.4 mg via ORAL
  Filled 2016-07-19: qty 1

## 2016-07-19 MED ORDER — TIZANIDINE HCL 4 MG PO TABS
4.0000 mg | ORAL_TABLET | Freq: Every day | ORAL | Status: DC
  Administered 2016-07-19: 4 mg via ORAL
  Filled 2016-07-19: qty 1

## 2016-07-19 MED ORDER — ACETAMINOPHEN 650 MG RE SUPP: 650.0000 mg | Freq: Four times a day (QID) | RECTAL | Status: DC | PRN

## 2016-07-19 MED ORDER — ALLOPURINOL 300 MG PO TABS
300.0000 mg | ORAL_TABLET | Freq: Every evening | ORAL | Status: DC
  Administered 2016-07-19: 300 mg via ORAL
  Filled 2016-07-19: qty 1

## 2016-07-19 MED ORDER — ONDANSETRON HCL 4 MG/2ML IJ SOLN
4.0000 mg | Freq: Four times a day (QID) | INTRAMUSCULAR | Status: DC | PRN
  Administered 2016-07-19: 4 mg via INTRAVENOUS
  Filled 2016-07-19: qty 2

## 2016-07-19 MED ORDER — PNEUMOCOCCAL VAC POLYVALENT 25 MCG/0.5ML IJ INJ
0.5000 mL | INJECTION | INTRAMUSCULAR | Status: AC
  Administered 2016-07-20: 0.5 mL via INTRAMUSCULAR
  Filled 2016-07-19: qty 0.5

## 2016-07-19 MED ORDER — FLUOXETINE HCL 20 MG PO CAPS
40.0000 mg | ORAL_CAPSULE | ORAL | Status: DC
  Administered 2016-07-20: 40 mg via ORAL
  Filled 2016-07-19: qty 2

## 2016-07-19 MED ORDER — MAGNESIUM SULFATE 4 GM/100ML IV SOLN
4.0000 g | Freq: Once | INTRAVENOUS | Status: AC
  Administered 2016-07-19: 4 g via INTRAVENOUS
  Filled 2016-07-19: qty 100

## 2016-07-19 MED ORDER — METFORMIN HCL ER 500 MG PO TB24
1000.0000 mg | ORAL_TABLET | Freq: Two times a day (BID) | ORAL | Status: DC
  Administered 2016-07-19: 500 mg via ORAL
  Administered 2016-07-20: 1000 mg via ORAL
  Filled 2016-07-19 (×2): qty 2

## 2016-07-19 MED ORDER — NABUMETONE 500 MG PO TABS
500.0000 mg | ORAL_TABLET | Freq: Two times a day (BID) | ORAL | Status: DC
Start: 2016-07-19 — End: 2016-07-20
  Administered 2016-07-19 – 2016-07-20 (×2): 500 mg via ORAL
  Filled 2016-07-19 (×3): qty 1

## 2016-07-19 MED ORDER — LOSARTAN POTASSIUM 50 MG PO TABS
100.0000 mg | ORAL_TABLET | Freq: Every day | ORAL | Status: DC
  Administered 2016-07-19: 100 mg via ORAL
  Filled 2016-07-19: qty 2

## 2016-07-19 MED ORDER — CLONAZEPAM 1 MG PO TABS: 1.0000 mg | ORAL_TABLET | Freq: Two times a day (BID) | ORAL | Status: DC | PRN

## 2016-07-19 MED ORDER — POTASSIUM CHLORIDE IN NACL 20-0.9 MEQ/L-% IV SOLN
INTRAVENOUS | Status: DC
  Administered 2016-07-19 – 2016-07-20 (×2): via INTRAVENOUS
  Filled 2016-07-19 (×5): qty 1000

## 2016-07-19 MED ORDER — ALBUTEROL SULFATE (2.5 MG/3ML) 0.083% IN NEBU: 2.5000 mg | INHALATION_SOLUTION | RESPIRATORY_TRACT | Status: DC | PRN

## 2016-07-19 MED ORDER — INSULIN ASPART 100 UNIT/ML ~~LOC~~ SOLN
0.0000 [IU] | Freq: Three times a day (TID) | SUBCUTANEOUS | Status: DC
  Administered 2016-07-19: 5 [IU] via SUBCUTANEOUS
  Administered 2016-07-20 (×2): 3 [IU] via SUBCUTANEOUS
  Filled 2016-07-19 (×2): qty 3
  Filled 2016-07-19: qty 5

## 2016-07-19 MED ORDER — FLUTICASONE PROPIONATE 50 MCG/ACT NA SUSP
2.0000 | Freq: Every day | NASAL | Status: DC | PRN
  Filled 2016-07-19: qty 16

## 2016-07-19 MED ORDER — SODIUM CHLORIDE 0.9 % IV SOLN
30.0000 meq | Freq: Once | INTRAVENOUS | Status: DC
  Filled 2016-07-19: qty 15

## 2016-07-19 MED ORDER — ONDANSETRON HCL 4 MG PO TABS: 4.0000 mg | ORAL_TABLET | Freq: Four times a day (QID) | ORAL | Status: DC | PRN

## 2016-07-19 MED ORDER — POLYETHYLENE GLYCOL 3350 17 G PO PACK: 17.0000 g | PACK | Freq: Every day | ORAL | Status: DC | PRN

## 2016-07-19 MED ORDER — MAGNESIUM SULFATE 2 GM/50ML IV SOLN: 2.0000 g | Freq: Once | INTRAVENOUS | Status: DC

## 2016-07-19 MED ORDER — ENOXAPARIN SODIUM 40 MG/0.4ML ~~LOC~~ SOLN
40.0000 mg | SUBCUTANEOUS | Status: DC
  Administered 2016-07-19: 40 mg via SUBCUTANEOUS
  Filled 2016-07-19: qty 0.4

## 2016-07-19 MED ORDER — LEVOFLOXACIN IN D5W 750 MG/150ML IV SOLN
750.0000 mg | INTRAVENOUS | Status: DC
  Administered 2016-07-19: 750 mg via INTRAVENOUS
  Filled 2016-07-19 (×3): qty 150

## 2016-07-19 MED ORDER — HYDROCODONE-ACETAMINOPHEN 10-325 MG PO TABS
1.0000 | ORAL_TABLET | Freq: Three times a day (TID) | ORAL | Status: DC | PRN
  Administered 2016-07-19 – 2016-07-20 (×2): 1 via ORAL
  Filled 2016-07-19 (×2): qty 1

## 2016-07-19 MED ORDER — POTASSIUM CHLORIDE CRYS ER 20 MEQ PO TBCR
40.0000 meq | EXTENDED_RELEASE_TABLET | ORAL | Status: AC
  Administered 2016-07-19 – 2016-07-20 (×3): 40 meq via ORAL
  Filled 2016-07-19 (×3): qty 2

## 2016-07-19 MED ORDER — ATORVASTATIN CALCIUM 20 MG PO TABS
40.0000 mg | ORAL_TABLET | Freq: Every day | ORAL | Status: DC
  Administered 2016-07-19: 40 mg via ORAL
  Filled 2016-07-19: qty 2

## 2016-07-19 MED ORDER — METOPROLOL SUCCINATE ER 100 MG PO TB24
100.0000 mg | ORAL_TABLET | ORAL | Status: DC
  Administered 2016-07-19 – 2016-07-20 (×2): 100 mg via ORAL
  Filled 2016-07-19 (×2): qty 1

## 2016-07-19 MED ORDER — AMLODIPINE BESYLATE 10 MG PO TABS
10.0000 mg | ORAL_TABLET | Freq: Every day | ORAL | Status: DC
  Administered 2016-07-19 – 2016-07-20 (×2): 10 mg via ORAL
  Filled 2016-07-19 (×2): qty 1

## 2016-07-19 MED ORDER — ACETAMINOPHEN 325 MG PO TABS
650.0000 mg | ORAL_TABLET | Freq: Four times a day (QID) | ORAL | Status: DC | PRN
  Administered 2016-07-19: 650 mg via ORAL
  Filled 2016-07-19: qty 2

## 2016-07-19 MED ORDER — GLIPIZIDE 5 MG PO TABS
5.0000 mg | ORAL_TABLET | Freq: Every day | ORAL | Status: DC
  Administered 2016-07-20: 5 mg via ORAL
  Filled 2016-07-19: qty 1

## 2016-07-19 MED ORDER — INSULIN ASPART 100 UNIT/ML ~~LOC~~ SOLN
0.0000 [IU] | Freq: Every day | SUBCUTANEOUS | Status: DC
  Administered 2016-07-19: 3 [IU] via SUBCUTANEOUS
  Filled 2016-07-19: qty 3

## 2016-07-19 MED ORDER — SODIUM CHLORIDE 0.9 % IV BOLUS (SEPSIS)
1000.0000 mL | Freq: Once | INTRAVENOUS | Status: AC
Start: 2016-07-19 — End: 2016-07-19
  Administered 2016-07-19: 1000 mL via INTRAVENOUS

## 2016-07-19 MED ORDER — MAGNESIUM OXIDE 400 (241.3 MG) MG PO TABS
400.0000 mg | ORAL_TABLET | Freq: Once | ORAL | Status: AC
  Administered 2016-07-19: 400 mg via ORAL
  Filled 2016-07-19: qty 1

## 2016-07-19 NOTE — Progress Notes (Signed)
Patient came in for appointment. Was direct admit to hospital

## 2016-07-19 NOTE — Progress Notes (Addendum)
Name: Evan Rivas   MRN: [id number]    DOB: 1950/12/15   Date:07/19/2016       Progress Note  Subjective  Chief Complaint  Chief Complaint  Patient presents with  . Follow-up    fever 99.3 to 101.1    HPI  Patient presents for follow up of flu-like symptoms and leukocytosis.  He was seen yesterday (07/18/16) and CBC showed WBC 21.6, he had repeat CBC today (4/18/218) and WBC is now 19.6. Pt continues to endorse chills, nasal congestion, sore throat, non-productive cough, body aches, and loose stools. He has been able to continue drinking plenty of fluids.  CXR from today 07/19/16 found: IMPRESSION: 1. Mild bibasilar atelectasis and infiltrates. A component of basilar scarring may be present . 2. Stable cardiomegaly. Electronically Signed   By: Marcello Moores  Register   On: 07/19/2016 11:47  He notes that he has not had any episodes of vomiting since taking Zofran last night. He checked his temperature last night and had a fever of 101.9 and declined to seek emergency management at that time despite previous instructions to do so. He has been taking around the clock Tylenol as discussed previously.  He also notes CBG at home this morning was 257 and he has not been taking any of his PO medications.  Wife notes that he did have one episode of mild confusion this morning.     Patient Active Problem List   Diagnosis Date Noted  . BPH without urinary obstruction 06/03/2015  . Depression 03/05/2015  . Hyperlipidemia 09/03/2014  . Major depressive disorder, recurrent episode, moderate ([place]) 09/03/2014  . Chronic low back pain 09/03/2014  . Arthralgia of both knees 09/03/2014  . Type 2 diabetes mellitus without complication ([place]) 75/01/2584  . Allergic rhinitis 07/04/2014  . Anxiety 07/04/2014  . Continuous opioid dependence ([place]) 07/04/2014  . DDD (degenerative disc disease), lumbar 07/04/2014  . Gout of foot 07/04/2014  . BP (high blood pressure) 07/04/2014  . Flu vaccine need  07/04/2014  . Adiposity 07/04/2014  . OA (osteoarthritis) of knee 07/04/2014  . Neuralgia neuritis, sciatic nerve 07/04/2014  . Arthralgia of multiple joints 07/04/2014  . H/O malignant neoplasm of skin 12/24/2013    Social History  Substance Use Topics  . Smoking status: Never Smoker  . Smokeless tobacco: Never Used  . Alcohol use No     Current Outpatient Prescriptions:  .  allopurinol (ZYLOPRIM) 300 MG tablet, Take 1 tablet (300 mg total) by mouth every evening., Disp: 90 tablet, Rfl: 0 .  amLODipine (NORVASC) 10 MG tablet, Take 1 tablet (10 mg total) by mouth daily., Disp: 90 tablet, Rfl: 1 .  aspirin 81 MG tablet, Take 81 mg by mouth daily., Disp: , Rfl:  .  atorvastatin (LIPITOR) 40 MG tablet, Take 1 tablet (40 mg total) by mouth at bedtime., Disp: 90 tablet, Rfl: 0 .  clonazePAM (KLONOPIN) 1 MG tablet, Take 1 tablet (1 mg total) by mouth 2 (two) times daily as needed., Disp: 180 tablet, Rfl: 0 .  FLUoxetine (PROZAC) 40 MG capsule, Take 1 capsule (40 mg total) by mouth every morning., Disp: 90 capsule, Rfl: 1 .  fluticasone (FLONASE) 50 MCG/ACT nasal spray, Place 2 sprays into both nostrils daily. 2 sprays (Patient taking differently: Place 2 sprays into both nostrils daily as needed (for allergies/sinuses). ), Disp: 16 g, Rfl: 0 .  glipiZIDE (GLUCOTROL) 5 MG tablet, Take 1 tablet (5 mg total) by mouth daily., Disp: 90 tablet, Rfl: 0 .  HYDROcodone-acetaminophen (NORCO) 10-325 MG tablet, Take 1 tablet by mouth every 8 (eight) hours as needed., Disp: 90 tablet, Rfl: 0 .  losartan (COZAAR) 100 MG tablet, Take 1 tablet (100 mg total) by mouth at bedtime., Disp: 90 tablet, Rfl: 0 .  metFORMIN (GLUCOPHAGE-XR) 500 MG 24 hr tablet, Take 2 tablets (1,000 mg total) by mouth 2 (two) times daily. 104m BID, Disp: 360 tablet, Rfl: 1 .  metoprolol succinate (TOPROL-XL) 100 MG 24 hr tablet, Take 1 tablet (100 mg total) by mouth every morning., Disp: 90 tablet, Rfl: 0 .  metoprolol succinate  (TOPROL-XL) 25 MG 24 hr tablet, Take 1 tablet (25 mg total) by mouth every morning., Disp: 90 tablet, Rfl: 1 .  Multiple Vitamin (MULTIVITAMINS PO), Take by mouth., Disp: , Rfl:  .  nabumetone (RELAFEN) 500 MG tablet, Take 1 tablet (500 mg total) by mouth 2 (two) times daily., Disp: 180 tablet, Rfl: 0 .  ondansetron (ZOFRAN) 4 MG tablet, Take 1 tablet (4 mg total) by mouth every 8 (eight) hours as needed for nausea or vomiting., Disp: 15 tablet, Rfl: 0 .  oseltamivir (TAMIFLU) 75 MG capsule, Take 1 capsule (75 mg total) by mouth 2 (two) times daily., Disp: 10 capsule, Rfl: 0 .  tamsulosin (FLOMAX) 0.4 MG CAPS capsule, Take 1 capsule (0.4 mg total) by mouth daily after supper., Disp: 90 capsule, Rfl: 0 .  tiZANidine (ZANAFLEX) 4 MG tablet, Take 1 tablet (4 mg total) by mouth at bedtime., Disp: 90 tablet, Rfl: 0  Allergies  Allergen Reactions  . 2,4-D Dimethylamine (Amisol) Nausea And Vomiting    Pt is not aware of this  . Celebrex  [Celecoxib]     GI  . Tetanus Toxoids Other (See Comments)    unkown  . Tetanus-Diphtheria Toxoids Td     Other reaction(s): Unknown    ROS  Constitutional: Positive for fever and chills Respiratory: Positive for cough and negative for shortness of breath.  Cardiovascular: Negative for chest pain or palpitations.  Gastrointestinal: Negative for abdominal pain; Positive for loose stool and nausea/vomiting. Musculoskeletal: Negative for gait problem or joint swelling. Endorses generalized body aches. Skin: Negative for rash.  Neurological: Negative for dizziness or headache. Positive for episode of confusion this morning. No other specific complaints in a complete review of systems (except as listed in HPI above).   Objective  Vitals:   07/19/16 1049  BP: 120/70  Pulse: (!) 122  Resp: 18  Temp: 99.3 F (37.4 C)  TempSrc: Oral  SpO2: 95%  Weight: 262 lb (118.8 kg)  Height: '6\' 2"'  (1.88 m)    Body mass index is 33.64 kg/m.  Nursing Note and  Vital Signs reviewed.  Physical Exam  Constitutional: Patient appears well-developed and well-nourished. Obese, no acute distress. HEENT: head atraumatic, normocephalic, pupils equal and reactive to light, TM's without erythema or bulging, no maxillary or frontal sinus pain on palpation, neck supple without lymphadenopathy, oropharynx mildly erythematous and moist without exudate Cardiovascular: Normal rate, regular rhythm, S1/S2 present.  No murmur or rub heard. No BLE edema. Pulmonary/Chest: Effort normal and breath sounds clear bilaterally. No respiratory distress or retractions. Abdominal: Soft and non-tender, bowel sounds present x4 quadrants. Psychiatric: Patient has a normal mood and affect. behavior is normal. Judgment and thought content normal.  Neurologic: Alert and oriented x4  Recent Results (from the past 2160 hour(s))  Glucose, capillary     Status: Abnormal   Collection Time: 04/24/16  8:29 AM  Result Value Ref Range  Glucose-Capillary 167 (H) 65 - 99 mg/dL   Comment 1 Notify RN   Glucose, capillary     Status: Abnormal   Collection Time: 04/24/16  1:32 PM  Result Value Ref Range   Glucose-Capillary 177 (H) 65 - 99 mg/dL  Glucose, capillary     Status: Abnormal   Collection Time: 04/24/16  5:28 PM  Result Value Ref Range   Glucose-Capillary 205 (H) 65 - 99 mg/dL  Glucose, capillary     Status: Abnormal   Collection Time: 04/24/16  9:00 PM  Result Value Ref Range   Glucose-Capillary 214 (H) 65 - 99 mg/dL  CBC     Status: Abnormal   Collection Time: 04/25/16  4:14 AM  Result Value Ref Range   WBC 10.6 (H) 4.0 - 10.5 K/uL   RBC 4.29 4.22 - 5.81 MIL/uL   Hemoglobin 13.3 13.0 - 17.0 g/dL   HCT 38.9 (L) 39.0 - 52.0 %   MCV 90.7 78.0 - 100.0 fL   MCH 31.0 26.0 - 34.0 pg   MCHC 34.2 30.0 - 36.0 g/dL   RDW 13.2 11.5 - 15.5 %   Platelets 199 150 - 400 K/uL  Basic metabolic panel     Status: Abnormal   Collection Time: 04/25/16  4:14 AM  Result Value Ref Range    Sodium 140 135 - 145 mmol/L   Potassium 3.5 3.5 - 5.1 mmol/L   Chloride 104 101 - 111 mmol/L   CO2 28 22 - 32 mmol/L   Glucose, Bld 189 (H) 65 - 99 mg/dL   BUN 15 6 - 20 mg/dL   Creatinine, Ser 0.94 0.61 - 1.24 mg/dL   Calcium 8.3 (L) 8.9 - 10.3 mg/dL   GFR calc non Af Amer >60 >60 mL/min   GFR calc Af Amer >60 >60 mL/min    Comment: (NOTE) The eGFR has been calculated using the CKD EPI equation. This calculation has not been validated in all clinical situations. eGFR's persistently <60 mL/min signify possible Chronic Kidney Disease.    Anion gap 8 5 - 15  Glucose, capillary     Status: Abnormal   Collection Time: 04/25/16  7:33 AM  Result Value Ref Range   Glucose-Capillary 172 (H) 65 - 99 mg/dL  Glucose, capillary     Status: Abnormal   Collection Time: 04/25/16 12:00 PM  Result Value Ref Range   Glucose-Capillary 218 (H) 65 - 99 mg/dL  Glucose, capillary     Status: Abnormal   Collection Time: 04/25/16  4:57 PM  Result Value Ref Range   Glucose-Capillary 204 (H) 65 - 99 mg/dL  CBC     Status: Abnormal   Collection Time: 04/26/16  4:15 AM  Result Value Ref Range   WBC 10.0 4.0 - 10.5 K/uL   RBC 4.08 (L) 4.22 - 5.81 MIL/uL   Hemoglobin 12.6 (L) 13.0 - 17.0 g/dL   HCT 36.9 (L) 39.0 - 52.0 %   MCV 90.4 78.0 - 100.0 fL   MCH 30.9 26.0 - 34.0 pg   MCHC 34.1 30.0 - 36.0 g/dL   RDW 13.4 11.5 - 15.5 %   Platelets 173 150 - 400 K/uL  Basic metabolic panel     Status: Abnormal   Collection Time: 04/26/16  4:15 AM  Result Value Ref Range   Sodium 139 135 - 145 mmol/L   Potassium 2.9 (L) 3.5 - 5.1 mmol/L    Comment: DELTA CHECK NOTED   Chloride 103 101 - 111 mmol/L  CO2 25 22 - 32 mmol/L   Glucose, Bld 213 (H) 65 - 99 mg/dL   BUN 13 6 - 20 mg/dL   Creatinine, Ser 0.85 0.61 - 1.24 mg/dL   Calcium 8.3 (L) 8.9 - 10.3 mg/dL   GFR calc non Af Amer >60 >60 mL/min   GFR calc Af Amer >60 >60 mL/min    Comment: (NOTE) The eGFR has been calculated using the CKD EPI  equation. This calculation has not been validated in all clinical situations. eGFR's persistently <60 mL/min signify possible Chronic Kidney Disease.    Anion gap 11 5 - 15  Glucose, capillary     Status: Abnormal   Collection Time: 04/26/16  7:50 AM  Result Value Ref Range   Glucose-Capillary 206 (H) 65 - 99 mg/dL  Glucose, capillary     Status: Abnormal   Collection Time: 04/26/16 11:45 AM  Result Value Ref Range   Glucose-Capillary 231 (H) 65 - 99 mg/dL  Basic metabolic panel     Status: Abnormal   Collection Time: 04/26/16  2:34 PM  Result Value Ref Range   Sodium 139 135 - 145 mmol/L   Potassium 3.4 (L) 3.5 - 5.1 mmol/L   Chloride 104 101 - 111 mmol/L   CO2 27 22 - 32 mmol/L   Glucose, Bld 235 (H) 65 - 99 mg/dL   BUN 14 6 - 20 mg/dL   Creatinine, Ser 0.97 0.61 - 1.24 mg/dL   Calcium 8.5 (L) 8.9 - 10.3 mg/dL   GFR calc non Af Amer >60 >60 mL/min   GFR calc Af Amer >60 >60 mL/min    Comment: (NOTE) The eGFR has been calculated using the CKD EPI equation. This calculation has not been validated in all clinical situations. eGFR's persistently <60 mL/min signify possible Chronic Kidney Disease.    Anion gap 8 5 - 15  POCT HgB A1C     Status: None   Collection Time: 06/09/16  8:22 AM  Result Value Ref Range   Hemoglobin A1C 6.9   POCT Glucose (CBG)     Status: Abnormal   Collection Time: 06/09/16  8:23 AM  Result Value Ref Range   POC Glucose 165 (A) 70 - 99 mg/dl  Lipid Panel w/o Chol/HDL Ratio     Status: Abnormal   Collection Time: 06/09/16  9:09 AM  Result Value Ref Range   Cholesterol, Total 145 100 - 199 mg/dL   Triglycerides 106 0 - 149 mg/dL   HDL 37 (L) >39 mg/dL   VLDL Cholesterol Cal 21 5 - 40 mg/dL   LDL Calculated 87 0 - 99 mg/dL  POCT Influenza A/B     Status: None   Collection Time: 07/18/16 10:28 AM  Result Value Ref Range   Influenza A, POC Negative Negative   Influenza B, POC Negative Negative  CBC     Status: Abnormal   Collection Time:  07/18/16 11:18 AM  Result Value Ref Range   WBC 21.6 (HH) 3.4 - 10.8 x10E3/uL   RBC 4.15 4.14 - 5.80 x10E6/uL   Hemoglobin 12.9 (L) 13.0 - 17.7 g/dL   Hematocrit 38.7 37.5 - 51.0 %   MCV 93 79 - 97 fL   MCH 31.1 26.6 - 33.0 pg   MCHC 33.3 31.5 - 35.7 g/dL   RDW 14.9 12.3 - 15.4 %   Platelets 269 150 - 379 x10E3/uL  CBC     Status: Abnormal   Collection Time: 07/19/16 10:19 AM  Result Value Ref  Range   WBC 19.6 (H) 3.8 - 10.6 K/uL   RBC 4.07 (L) 4.40 - 5.90 MIL/uL   Hemoglobin 12.4 (L) 13.0 - 18.0 g/dL   HCT 36.4 (L) 40.0 - 52.0 %   MCV 89.5 80.0 - 100.0 fL   MCH 30.4 26.0 - 34.0 pg   MCHC 34.0 32.0 - 36.0 g/dL   RDW 15.1 (H) 11.5 - 14.5 %   Platelets 260 150 - 440 K/uL     Assessment & Plan  1. Pneumonia of both lower lobes due to infectious organism [facility]) - Advised patient that due to CXR result, tachycardia at 122bpm, SpO2 95%, fever reaching 101.51F, and his higher risk status (>65yo, Diabetes) he needs inpatient management for his illness. -Discussed plan with patient and he is agreeable - he will present to admitting at [facility] for direct admission.  Report provided to and accepting Physician is Dr. Ether Griffins.  2. Flu-like symptoms - POCT Influenza A/B - Negative Result on 07/18/16  I have reviewed this encounter including the documentation in this note and/or discussed this patient with the Johney Maine, FNP, NP-C. I am certifying that I agree with the content of this note as supervising physician.  Steele Sizer, MD [facility] 07/20/2016, 8:03 AM

## 2016-07-19 NOTE — Progress Notes (Signed)
Spoke with the patient via telephone regarding very elevated WBC count and my concern for pneumonia. He reports no more nausea/vomiting after taking Zofran last night. Advised that he needs to have Chest Xray and repeat CBC stat performed at [facility] hospital, and then contact the office to come in today immediately afterwards to discuss results and plan of care.  Advised patient that he needs to come in as soon as possible. Patient is agreeable to this plan.  Please allow patient to schedule appointment whenever he is able to come in regardless of time/my schedule.  Thank you so much!

## 2016-07-19 NOTE — H&P (Signed)
[facility] NAME: Evan Rivas    MR#:  [id number]  DATE OF BIRTH:  09-25-1950  DATE OF ADMISSION:  07/19/2016  PRIMARY CARE PHYSICIAN: Keith Rake, MD   REQUESTING/REFERRING : [name] FNP  CHIEF COMPLAINT:  No chief complaint on file.  Cough and fever  HISTORY OF PRESENT ILLNESS:  Evan Rivas  is a 66 y.o. male with a known history of DM, HTN, Gout presents here from his PCP due to cough, fever, myalgias and not feeling well. Also had diarrhea. Started on tamiflu empirically yesterday. Flu A and B negative Patient declined CXR yesterday and due to elevated WBC agreed to get it. CXR showed bibasilar pneumonia and WBC 21K. Tachycardia and fever. Sent to hospital as direct admission.  Had some vomiting and diarrhea yesterday that has resolved.  PAST MEDICAL HISTORY:   Past Medical History:  Diagnosis Date  . Anxiety    panic attacks  . Arthritis   . Cancer (HCC)    lip  . Depression   . Diabetes mellitus without complication ([place])    type 2  . Gout   . History of kidney stones   . Hyperlipidemia   . Hypertension   . Pneumonia    2014    PAST SURGICAL HISTORY:   Past Surgical History:  Procedure Laterality Date  . ARTHROSCOPIC REPAIR ACL Left   . CYSTOSCOPY W/ URETERAL STENT PLACEMENT Right 09/07/2014   Procedure: CYSTOSCOPY WITH STENT REPLACEMENT;  Surgeon: Hollice Espy, MD;  Location: [facility];  Service: Urology;  Laterality: Right;  . CYSTOSCOPY/URETEROSCOPY/HOLMIUM LASER/STENT PLACEMENT Right 08/17/2014   Procedure: CYSTOSCOPY/URETEROSCOPY//STENT PLACEMENT/ attempt of lithotripsy;  Surgeon: Hollice Espy, MD;  Location: [facility];  Service: Urology;  Laterality: Right;  . HERNIA REPAIR    . MOHS SURGERY     lip  . TOTAL KNEE ARTHROPLASTY Left 04/24/2016   Procedure: LEFT TOTAL KNEE ARTHROPLASTY;  Surgeon: Gaynelle Arabian, MD;  Location: [facility];  Service: Orthopedics;  Laterality: Left;  Adductor Block   . URETEROSCOPY WITH HOLMIUM LASER LITHOTRIPSY Right 09/07/2014   Procedure: URETEROSCOPY WITH HOLMIUM LASER LITHOTRIPSY;  Surgeon: Hollice Espy, MD;  Location: [facility];  Service: Urology;  Laterality: Right;  [name] VASECTOMY      SOCIAL HISTORY:   Social History  Substance Use Topics  . Smoking status: Never Smoker  . Smokeless tobacco: Never Used  . Alcohol use No    FAMILY HISTORY:   Family History  Problem Relation Age of Onset  . Hypertension Mother   . Cancer Mother   . Stroke Father   . Depression Sister     DRUG ALLERGIES:   Allergies  Allergen Reactions  . Celebrex  [Celecoxib]     GI  . Tetanus Toxoids Other (See Comments)    unkown  . Tetanus-Diphtheria Toxoids Td     Other reaction(s): Unknown   REVIEW OF SYSTEMS:   Review of Systems  Constitutional: Positive for malaise/fatigue. Negative for chills, fever and weight loss.  HENT: Negative for hearing loss and nosebleeds.   Eyes: Negative for blurred vision, double vision and pain.  Respiratory: Positive for cough. Negative for hemoptysis, sputum production, shortness of breath and wheezing.   Cardiovascular: Negative for chest pain, palpitations, orthopnea and leg swelling.  Gastrointestinal: Negative for abdominal pain, constipation, diarrhea, nausea and vomiting.  Genitourinary: Negative for dysuria and hematuria.  Musculoskeletal: Positive for myalgias. Negative for back pain and falls.  Skin: Negative for rash.  Neurological: Positive for weakness. Negative for dizziness, tremors, sensory change, speech change, focal weakness, seizures and headaches.  Endo/Heme/Allergies: Does not bruise/bleed easily.  Psychiatric/Behavioral: Negative for depression and memory loss. The patient is not nervous/anxious.    MEDICATIONS AT HOME:   Prior to Admission medications   Medication Sig Start Date End Date Taking? Authorizing Provider  allopurinol (ZYLOPRIM) 300 MG tablet Take 1 tablet (300 mg total) by mouth  every evening. 06/09/16   Roselee Nova, MD  amLODipine (NORVASC) 10 MG tablet Take 1 tablet (10 mg total) by mouth daily. 06/09/16   Roselee Nova, MD  aspirin 81 MG tablet Take 81 mg by mouth daily.    Historical Provider, MD  atorvastatin (LIPITOR) 40 MG tablet Take 1 tablet (40 mg total) by mouth at bedtime. 06/09/16   Roselee Nova, MD  clonazePAM (KLONOPIN) 1 MG tablet Take 1 tablet (1 mg total) by mouth 2 (two) times daily as needed. 06/09/16   Roselee Nova, MD  FLUoxetine (PROZAC) 40 MG capsule Take 1 capsule (40 mg total) by mouth every morning. 12/08/15   Roselee Nova, MD  fluticasone (FLONASE) 50 MCG/ACT nasal spray Place 2 sprays into both nostrils daily. 2 sprays Patient taking differently: Place 2 sprays into both nostrils daily as needed (for allergies/sinuses).  03/09/16   Roselee Nova, MD  glipiZIDE (GLUCOTROL) 5 MG tablet Take 1 tablet (5 mg total) by mouth daily. 06/09/16   Roselee Nova, MD  HYDROcodone-acetaminophen (NORCO) 10-325 MG tablet Take 1 tablet by mouth every 8 (eight) hours as needed. 07/12/16   Roselee Nova, MD  losartan (COZAAR) 100 MG tablet Take 1 tablet (100 mg total) by mouth at bedtime. 06/09/16   Roselee Nova, MD  metFORMIN (GLUCOPHAGE-XR) 500 MG 24 hr tablet Take 2 tablets (1,000 mg total) by mouth 2 (two) times daily. 1000mg  BID 06/09/16   Roselee Nova, MD  metoprolol succinate (TOPROL-XL) 100 MG 24 hr tablet Take 1 tablet (100 mg total) by mouth every morning. 06/09/16   Roselee Nova, MD  metoprolol succinate (TOPROL-XL) 25 MG 24 hr tablet Take 1 tablet (25 mg total) by mouth every morning. 06/09/16   Roselee Nova, MD  Multiple Vitamin (MULTIVITAMINS PO) Take by mouth.    Historical Provider, MD  nabumetone (RELAFEN) 500 MG tablet Take 1 tablet (500 mg total) by mouth 2 (two) times daily. 06/09/16   Roselee Nova, MD  ondansetron (ZOFRAN) 4 MG tablet Take 1 tablet (4 mg total) by mouth every 8 (eight) hours as needed for nausea or  vomiting. 07/18/16   Hubbard Hartshorn, FNP  oseltamivir (TAMIFLU) 75 MG capsule Take 1 capsule (75 mg total) by mouth 2 (two) times daily. 07/18/16 07/23/16  Hubbard Hartshorn, FNP  tamsulosin (FLOMAX) 0.4 MG CAPS capsule Take 1 capsule (0.4 mg total) by mouth daily after supper. 06/09/16   Roselee Nova, MD  tiZANidine (ZANAFLEX) 4 MG tablet Take 1 tablet (4 mg total) by mouth at bedtime. 06/09/16   Roselee Nova, MD   VITAL SIGNS:  Blood pressure (!) 143/113, pulse (!) 108, temperature 98.2 F (36.8 C), temperature source Oral, resp. rate 18, height 6\' 2"  (1.88 m), weight 115 kg (253 lb 9.6 oz), SpO2 95 %.  PHYSICAL EXAMINATION:  Physical Exam  GENERAL:  65 y.o.-year-old patient lying in the bed with no acute distress.  EYES: Pupils equal, round, reactive to light and accommodation. No scleral icterus. Extraocular muscles intact.  HEENT: Head atraumatic, normocephalic. Oropharynx and nasopharynx clear. No oropharyngeal erythema, moist oral mucosa  NECK:  Supple, no jugular venous distention. No thyroid enlargement, no tenderness.  LUNGS: Normal breath sounds bilaterally, no wheezing, rales, rhonchi. No use of accessory muscles of respiration.  CARDIOVASCULAR: S1, S2 normal. No murmurs, rubs, or gallops.  ABDOMEN: Soft, nontender, nondistended. Bowel sounds present. No organomegaly or mass.  EXTREMITIES: No pedal edema, cyanosis, or clubbing. + 2 pedal & radial pulses b/l.   NEUROLOGIC: Cranial nerves II through XII are intact. No focal Motor or sensory deficits appreciated b/l PSYCHIATRIC: The patient is alert and oriented x 3. Good affect.  SKIN: No obvious rash, lesion, or ulcer.   LABORATORY PANEL:   CBC  Recent Labs Lab 07/19/16 1019  WBC 19.6*  HGB 12.4*  HCT 36.4*  PLT 260   ------------------------------------------------------------------------------------------------------------------ Chemistries  No results for input(s): NA, K, CL, CO2, GLUCOSE, BUN, CREATININE, CALCIUM, MG,  AST, ALT, ALKPHOS, BILITOT in the last 168 hours.  Invalid input(s): GFRCGP ------------------------------------------------------------------------------------------------------------------  Cardiac Enzymes No results for input(s): TROPONINI in the last 168 hours. ------------------------------------------------------------------------------------------------------------------  RADIOLOGY:  Dg Chest 2 View  Result Date: 07/19/2016 CLINICAL DATA:  Weakness.  Fever. EXAM: CHEST  2 VIEW COMPARISON:  03/09/2016 .  CT 12/28/2010. FINDINGS: Mediastinum hilar structures are normal. Heart size normal. Mild bibasilar atelectasis and infiltrates noted. A component basilar scarring may be present. No pleural effusion or pneumothorax. IMPRESSION: 1. Mild bibasilar atelectasis and infiltrates. A component of basilar scarring may be present . 2. Stable cardiomegaly. Electronically Signed   By: Marcello Moores  Register   On: 07/19/2016 11:47   IMPRESSION AND PLAN:   * Bibasilar pneumonia with Sepsis We will bolus 1 L normal saline stat. Check lactic acid. Check BMP. Start IV Levaquin. Blood and sputum cultures. Influenza A and B negative.  * Hypertension. Continue home medications.  * Diabetes mellitus. Continue home medications. Add sliding scale insulin.  * DVT prophylaxis Lovenox  All the records are reviewed and case discussed with ED provider. Management plans discussed with the patient, family and they are in agreement.  CODE STATUS: FULL CODE  TOTAL TIME TAKING CARE OF THIS PATIENT: 40 minutes.   Hillary Bow R M.D on 07/19/2016 at 2:47 PM  Between 7am to 6pm - Pager - [phone]  After 6pm go to [email or web address] - password [email or web address] [facility]  Office  [phone]  CC: Primary care physician; Keith Rake, MD  Note: This dictation was prepared with Dragon dictation along with smaller phrase technology. Any transcriptional errors that result from this process are  unintentional.

## 2016-07-20 ENCOUNTER — Encounter: Payer: Self-pay | Admitting: Family Medicine

## 2016-07-20 LAB — BLOOD CULTURE ID PANEL (REFLEXED)
Acinetobacter baumannii: NOT DETECTED
CANDIDA KRUSEI: NOT DETECTED
CARBAPENEM RESISTANCE: NOT DETECTED
Candida albicans: NOT DETECTED
Candida glabrata: NOT DETECTED
Candida parapsilosis: NOT DETECTED
Candida tropicalis: NOT DETECTED
ENTEROBACTER CLOACAE COMPLEX: NOT DETECTED
ENTEROCOCCUS SPECIES: NOT DETECTED
ESCHERICHIA COLI: NOT DETECTED
Enterobacteriaceae species: DETECTED — AB
Haemophilus influenzae: NOT DETECTED
KLEBSIELLA OXYTOCA: NOT DETECTED
Klebsiella pneumoniae: DETECTED — AB
LISTERIA MONOCYTOGENES: NOT DETECTED
Neisseria meningitidis: NOT DETECTED
PSEUDOMONAS AERUGINOSA: NOT DETECTED
Proteus species: NOT DETECTED
STAPHYLOCOCCUS AUREUS BCID: NOT DETECTED
STREPTOCOCCUS PNEUMONIAE: NOT DETECTED
STREPTOCOCCUS PYOGENES: NOT DETECTED
Serratia marcescens: NOT DETECTED
Staphylococcus species: NOT DETECTED
Streptococcus agalactiae: NOT DETECTED
Streptococcus species: NOT DETECTED

## 2016-07-20 LAB — GLUCOSE, CAPILLARY
GLUCOSE-CAPILLARY: 239 mg/dL — AB (ref 65–99)
Glucose-Capillary: 208 mg/dL — ABNORMAL HIGH (ref 65–99)

## 2016-07-20 LAB — CBC
HCT: 34.3 % — ABNORMAL LOW (ref 40.0–52.0)
HEMOGLOBIN: 11.3 g/dL — AB (ref 13.0–18.0)
MCH: 30.1 pg (ref 26.0–34.0)
MCHC: 32.9 g/dL (ref 32.0–36.0)
MCV: 91.6 fL (ref 80.0–100.0)
Platelets: 244 10*3/uL (ref 150–440)
RBC: 3.75 MIL/uL — AB (ref 4.40–5.90)
RDW: 14.9 % — ABNORMAL HIGH (ref 11.5–14.5)
WBC: 12 10*3/uL — ABNORMAL HIGH (ref 3.8–10.6)

## 2016-07-20 LAB — BASIC METABOLIC PANEL
ANION GAP: 9 (ref 5–15)
BUN: 26 mg/dL — ABNORMAL HIGH (ref 6–20)
CALCIUM: 7.9 mg/dL — AB (ref 8.9–10.3)
CO2: 25 mmol/L (ref 22–32)
Chloride: 103 mmol/L (ref 101–111)
Creatinine, Ser: 1.54 mg/dL — ABNORMAL HIGH (ref 0.61–1.24)
GFR, EST AFRICAN AMERICAN: 53 mL/min — AB (ref >60)
GFR, EST NON AFRICAN AMERICAN: 46 mL/min — AB (ref >60)
Glucose, Bld: 245 mg/dL — ABNORMAL HIGH (ref 65–99)
Potassium: 3.7 mmol/L (ref 3.5–5.1)
Sodium: 137 mmol/L (ref 135–145)

## 2016-07-20 MED ORDER — LEVOFLOXACIN 750 MG PO TABS: 750.0000 mg | ORAL_TABLET | Freq: Every day | ORAL | Status: DC

## 2016-07-20 MED ORDER — LEVOFLOXACIN 750 MG PO TABS: 750.0000 mg | ORAL_TABLET | Freq: Every day | ORAL | 0 refills | Status: DC

## 2016-07-20 MED ORDER — DEXTROSE 5 % IV SOLN
2.0000 g | INTRAVENOUS | Status: DC
  Administered 2016-07-20: 2 g via INTRAVENOUS
  Filled 2016-07-20: qty 2

## 2016-07-20 NOTE — Progress Notes (Signed)
CBG 208 per Gerald Stabs NT. Glucometer not synching yet.

## 2016-07-20 NOTE — Progress Notes (Signed)
PHARMACY - PHYSICIAN COMMUNICATION CRITICAL VALUE ALERT - BLOOD CULTURE IDENTIFICATION (BCID)  Results for orders placed or performed during the hospital encounter of 07/19/16  Blood Culture ID Panel (Reflexed) (Collected: 07/19/2016  3:14 PM)  Result Value Ref Range   Enterococcus species NOT DETECTED NOT DETECTED   Listeria monocytogenes NOT DETECTED NOT DETECTED   Staphylococcus species NOT DETECTED NOT DETECTED   Staphylococcus aureus NOT DETECTED NOT DETECTED   Streptococcus species NOT DETECTED NOT DETECTED   Streptococcus agalactiae NOT DETECTED NOT DETECTED   Streptococcus pneumoniae NOT DETECTED NOT DETECTED   Streptococcus pyogenes NOT DETECTED NOT DETECTED   Acinetobacter baumannii NOT DETECTED NOT DETECTED   Enterobacteriaceae species DETECTED (A) NOT DETECTED   Enterobacter cloacae complex NOT DETECTED NOT DETECTED   Escherichia coli NOT DETECTED NOT DETECTED   Klebsiella oxytoca NOT DETECTED NOT DETECTED   Klebsiella pneumoniae DETECTED (A) NOT DETECTED   Proteus species NOT DETECTED NOT DETECTED   Serratia marcescens NOT DETECTED NOT DETECTED   Carbapenem resistance NOT DETECTED NOT DETECTED   Haemophilus influenzae NOT DETECTED NOT DETECTED   Neisseria meningitidis NOT DETECTED NOT DETECTED   Pseudomonas aeruginosa NOT DETECTED NOT DETECTED   Candida albicans NOT DETECTED NOT DETECTED   Candida glabrata NOT DETECTED NOT DETECTED   Candida krusei NOT DETECTED NOT DETECTED   Candida parapsilosis NOT DETECTED NOT DETECTED   Candida tropicalis NOT DETECTED NOT DETECTED    Name of physician (or Provider) Contacted: Fritzi Mandes   Changes to prescribed antibiotics required: Rocephin 2 g IV q24 hours.   Sonika Levins D 07/20/2016  10:18 AM

## 2016-07-20 NOTE — Progress Notes (Signed)
A&O. Independent. IV fluids infusing. SLept well through the night.

## 2016-07-20 NOTE — Progress Notes (Signed)
Inpatient Diabetes Program Recommendations  AACE/ADA: New Consensus Statement on Inpatient Glycemic Control (2015)  Target Ranges:  Prepandial:   less than 140 mg/dL      Peak postprandial:   less than 180 mg/dL (1-2 hours)      Critically ill patients:  140 - 180 mg/dL   Lab Results  Component Value Date   GLUCAP 261 (H) 07/19/2016   HGBA1C 6.9 06/09/2016    Review of Glycemic Control  Results for Evan Rivas, DIEPPA (MRN [id number]) as of 07/20/2016 08:23  Ref. Range 07/19/2016 16:47 07/19/2016 21:27  Glucose-Capillary Latest Ref Range: 65 - 99 mg/dL 260 (H) 261 (H)    Diabetes history: Type 2  Outpatient Diabetes medications: Glucotrol 5mg /day, Metformin 1000mg  bid  Current orders for Inpatient glycemic control: Glucotrol 5mg  /day, Metformin 1000 mg bid. Novolog sensitive correction  0-9 units tid, Novolog 0-5 units qhs  Inpatient Diabetes Program Recommendations:  Elevated CBG- consider stopping Glucotrol and Metformin while inpatient and beginning low dose basal insulin , Lantus 15 units qhs (0.13units/kg)  Gentry Fitz, RN, [place], [place], CDE Diabetes Coordinator Inpatient Diabetes Program  [phone] (Team Pager) [phone] ([place]) 07/20/2016 8:33 AM

## 2016-07-20 NOTE — Progress Notes (Signed)
Patient given discharge teaching and paperwork regarding medications, diet, follow-up appointments and activity. Patient understanding verbalized. No complaints at this time. IV and telemetry discontinued prior to leaving. Skin assessment as previously charted and vitals are stable; on room air. Patient being discharged to home. Caregiver/family present during discharge teaching. Prescriptions sent to pharmacy.

## 2016-07-20 NOTE — Discharge Summary (Signed)
[facility] NAME: Evan Rivas    MR#:  [id number]  DATE OF BIRTH:  October 27, 1950  DATE OF ADMISSION:  07/19/2016 ADMITTING PHYSICIAN: Theodoro Grist, MD  DATE OF DISCHARGE: 07/20/2016  PRIMARY CARE PHYSICIAN: Keith Rake, MD    ADMISSION DIAGNOSIS:  sepsis  DISCHARGE DIAGNOSIS:  Sepsis due to Pneumonia Hypokalemia Acute renal failure due to GI loses-improving  SECONDARY DIAGNOSIS:   Past Medical History:  Diagnosis Date  . Anxiety    panic attacks  . Arthritis   . Cancer (HCC)    lip  . Depression   . Diabetes mellitus without complication ([place])    type 2  . Gout   . History of kidney stones   . Hyperlipidemia   . Hypertension   . Pneumonia    2014    HOSPITAL COURSE:   Evan Rivas  is a 66 y.o. male with a known history of DM, HTN, Gout presents here from his PCP due to cough, fever, myalgias and not feeling well. Also had diarrhea. Started on tamiflu empirically yesterday. Flu A and B negative Patient declined CXR yesterday and due to elevated WBC agreed to get it. CXR showed bibasilar pneumonia and WBC 21K. Tachycardia and fever.  * Bibasilar pneumonia with Sepsis -1/4 bottle [place] -pt was on IV rocephin. D/w pharmacy change to po levaquin for 7 days -afebrile. Feels 100%. Tolerating po and wbc trending dow -Influenza A and B negative.  * Hypertension. Continue home medications.  * Diabetes mellitus. Continue home medications. Add sliding scale insulin.  * DVT prophylaxis Lovenox  Pt feels back to baseline. sats 98% on RA Agreeable to go home. CONSULTS OBTAINED:    DRUG ALLERGIES:   Allergies  Allergen Reactions  . Celebrex  [Celecoxib]     GI  . Tetanus Toxoids Other (See Comments)    unkown  . Tetanus-Diphtheria Toxoids Td     Other reaction(s): Unknown    DISCHARGE MEDICATIONS:   Current Discharge Medication List    START taking these medications   Details   levofloxacin (LEVAQUIN) 750 MG tablet Take 1 tablet (750 mg total) by mouth daily. Qty: 7 tablet, Refills: 0      CONTINUE these medications which have NOT CHANGED   Details  allopurinol (ZYLOPRIM) 300 MG tablet Take 1 tablet (300 mg total) by mouth every evening. 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 Qty: 90 tablet, Refills: 1   Associated Diagnoses: Essential hypertension    aspirin 81 MG tablet Take 81 mg by mouth daily.    atorvastatin (LIPITOR) 40 MG tablet Take 1 tablet (40 mg total) by mouth at bedtime. Qty: 90 tablet, Refills: 0   Associated Diagnoses: Pure hypercholesterolemia    clonazePAM (KLONOPIN) 1 MG tablet Take 1 tablet (1 mg total) by mouth 2 (two) times daily as needed. Qty: 180 tablet, Refills: 0   Associated Diagnoses: Anxiety    FLUoxetine (PROZAC) 40 MG capsule Take 1 capsule (40 mg total) by mouth every morning. Qty: 90 capsule, Refills: 1   Associated Diagnoses: Major depressive disorder, recurrent episode, moderate (HCC)    fluticasone (FLONASE) 50 MCG/ACT nasal spray Place 2 sprays into both nostrils daily. 2 sprays Qty: 16 g, Refills: 0   Associated Diagnoses: Other seasonal allergic rhinitis    glipiZIDE (  GLUCOTROL) 5 MG tablet Take 1 tablet (5 mg total) by mouth daily. Qty: 90 tablet, Refills: 0   Associated Diagnoses: Type 2 diabetes mellitus without complication, without long-term current use of insulin (HCC)    HYDROcodone-acetaminophen (NORCO) 10-325 MG tablet Take 1 tablet by mouth every 8 (eight) hours as needed. Qty: 90 tablet, Refills: 0   Associated Diagnoses: Primary osteoarthritis of both knees    losartan (COZAAR) 100 MG tablet Take 1 tablet (100 mg total) by mouth at bedtime. Qty: 90 tablet, Refills: 0   Associated Diagnoses: Essential hypertension    metFORMIN (GLUCOPHAGE-XR) 500  MG 24 hr tablet Take 2 tablets (1,000 mg total) by mouth 2 (two) times daily. 1000mg  BID Qty: 360 tablet, Refills: 1   Associated Diagnoses: Type 2 diabetes mellitus without complication, without long-term current use of insulin ([place])    !! metoprolol succinate (TOPROL-XL) 100 MG 24 hr tablet Take 1 tablet (100 mg total) by mouth every morning. Qty: 90 tablet, Refills: 0   Associated Diagnoses: Essential hypertension    !! metoprolol succinate (TOPROL-XL) 25 MG 24 hr tablet Take 1 tablet (25 mg total) by mouth every morning. Qty: 90 tablet, Refills: 1   Associated Diagnoses: Essential hypertension    Multiple Vitamin (MULTIVITAMINS PO) Take by mouth.    nabumetone (RELAFEN) 500 MG tablet Take 1 tablet (500 mg total) by mouth 2 (two) times daily. Qty: 180 tablet, Refills: 0   Associated Diagnoses: Primary osteoarthritis of both knees    ondansetron (ZOFRAN) 4 MG tablet Take 1 tablet (4 mg total) by mouth every 8 (eight) hours as needed for nausea or vomiting. Qty: 15 tablet, Refills: 0    tamsulosin (FLOMAX) 0.4 MG CAPS capsule Take 1 capsule (0.4 mg total) by mouth daily after supper. Qty: 90 capsule, Refills: 0   Associated Diagnoses: BPH without urinary obstruction    tiZANidine (ZANAFLEX) 4 MG tablet Take 1 tablet (4 mg total) by mouth at bedtime. Qty: 90 tablet, Refills: 0   Associated Diagnoses: Chronic midline low back pain without sciatica     !! - Potential duplicate medications found. Please discuss with provider.    STOP taking these medications     oseltamivir (TAMIFLU) 75 MG capsule         If you experience worsening of your admission symptoms, develop shortness of breath, life threatening emergency, suicidal or homicidal thoughts you must seek medical attention immediately by calling 911 or calling your MD immediately  if symptoms less severe.  You Must read complete instructions/literature along with all the possible adverse reactions/side effects for all the  Medicines you take and that have been prescribed to you. Take any new Medicines after you have completely understood and accept all the possible adverse reactions/side effects.   Please note  You were cared for by a hospitalist during your hospital stay. If you have any questions about your discharge medications or the care you received while you were in the hospital after you are discharged, you can call the unit and asked to speak with the hospitalist on call if the hospitalist that took care of you is not available. Once you are discharged, your primary care physician will handle any further medical issues. Please note that NO REFILLS for any discharge medications will be authorized once you are discharged, as it is imperative that you return to your primary care physician (or establish a relationship with a primary care physician if you do not have one) for your aftercare needs so  that they can reassess your need for medications and monitor your lab values. Today   SUBJECTIVE   I feel a whole lot better Eating lunch  VITAL SIGNS:  Blood pressure 139/70, pulse 92, temperature 99.5 F (37.5 C), temperature source Oral, resp. rate 18, height 6\' 2"  (1.88 m), weight 114.9 kg (253 lb 4.8 oz), SpO2 98 %.  I/O:   Intake/Output Summary (Last 24 hours) at 07/20/16 1312 Last data filed at 07/20/16 1017  Gross per 24 hour  Intake          1985.83 ml  Output              701 ml  Net          1284.83 ml    PHYSICAL EXAMINATION:  GENERAL:  66 y.o.-year-old patient lying in the bed with no acute distress.  EYES: Pupils equal, round, reactive to light and accommodation. No scleral icterus. Extraocular muscles intact.  HEENT: Head atraumatic, normocephalic. Oropharynx and nasopharynx clear.  NECK:  Supple, no jugular venous distention. No thyroid enlargement, no tenderness.  LUNGS: Normal breath sounds bilaterally, no wheezing, rales,rhonchi or crepitation. No use of accessory muscles of  respiration.  CARDIOVASCULAR: S1, S2 normal. No murmurs, rubs, or gallops.  ABDOMEN: Soft, non-tender, non-distended. Bowel sounds present. No organomegaly or mass.  EXTREMITIES: No pedal edema, cyanosis, or clubbing.  NEUROLOGIC: Cranial nerves II through XII are intact. Muscle strength 5/5 in all extremities. Sensation intact. Gait not checked.  PSYCHIATRIC: The patient is alert and oriented x 3.  SKIN: No obvious rash, lesion, or ulcer.   DATA REVIEW:   CBC   Recent Labs Lab 07/20/16 0405  WBC 12.0*  HGB 11.3*  HCT 34.3*  PLT 244    Chemistries   Recent Labs Lab 07/19/16 1512 07/20/16 0405  NA 134* 137  K 2.4* 3.7  CL 100* 103  CO2 23 25  GLUCOSE 302* 245*  BUN 25* 26*  CREATININE 1.71* 1.54*  CALCIUM 8.1* 7.9*  MG 1.3*  --     Microbiology Results   Recent Results (from the past 240 hour(s))  CULTURE, BLOOD (ROUTINE X 2) w Reflex to ID Panel     Status: None (Preliminary result)   Collection Time: 07/19/16  3:14 PM  Result Value Ref Range Status   Specimen Description BLOOD R AC  Final   Special Requests   Final    Blood Culture results may not be optimal due to an excessive volume of blood received in culture bottles   Culture  Setup Time   Final    GRAM NEGATIVE RODS AEROBIC BOTTLE ONLY CRITICAL RESULT CALLED TO, READ BACK BY AND VERIFIED WITH:  CHRISTINE KATSOUDAS AT [place] 07/20/16 SDR Performed at [facility], [place] [address] [place], [place] [address]    Culture GRAM NEGATIVE RODS  Final   Report Status PENDING  Incomplete  Blood Culture ID Panel (Reflexed)     Status: Abnormal   Collection Time: 07/19/16  3:14 PM  Result Value Ref Range Status   Enterococcus species NOT DETECTED NOT DETECTED Final   Listeria monocytogenes NOT DETECTED NOT DETECTED Final   Staphylococcus species NOT DETECTED NOT DETECTED Final   Staphylococcus aureus NOT DETECTED NOT DETECTED Final   Streptococcus species NOT DETECTED NOT DETECTED Final   Streptococcus  agalactiae NOT DETECTED NOT DETECTED Final   Streptococcus pneumoniae NOT DETECTED NOT DETECTED Final   Streptococcus pyogenes NOT DETECTED NOT DETECTED Final   Acinetobacter baumannii  NOT DETECTED NOT DETECTED Final   Enterobacteriaceae species DETECTED (A) NOT DETECTED Final    Comment: Enterobacteriaceae represent a large family of gram-negative bacteria, not a single organism. CRITICAL RESULT CALLED TO, READ BACK BY AND VERIFIED WITH:  CHRISTINE KATSOUDAS AT 0945 07/19/16 SDR    Enterobacter cloacae complex NOT DETECTED NOT DETECTED Final   Escherichia coli NOT DETECTED NOT DETECTED Final   Klebsiella oxytoca NOT DETECTED NOT DETECTED Final   Klebsiella pneumoniae DETECTED (A) NOT DETECTED Final    Comment: CRITICAL RESULT CALLED TO, READ BACK BY AND VERIFIED WITH:  CHRISTINE KATSOUDAS AT 0945 07/20/16 SDR    Proteus species NOT DETECTED NOT DETECTED Final   Serratia marcescens NOT DETECTED NOT DETECTED Final   Carbapenem resistance NOT DETECTED NOT DETECTED Final   Haemophilus influenzae NOT DETECTED NOT DETECTED Final   Neisseria meningitidis NOT DETECTED NOT DETECTED Final   Pseudomonas aeruginosa NOT DETECTED NOT DETECTED Final   Candida albicans NOT DETECTED NOT DETECTED Final   Candida glabrata NOT DETECTED NOT DETECTED Final   Candida krusei NOT DETECTED NOT DETECTED Final   Candida parapsilosis NOT DETECTED NOT DETECTED Final   Candida tropicalis NOT DETECTED NOT DETECTED Final  CULTURE, BLOOD (ROUTINE X 2) w Reflex to ID Panel     Status: None (Preliminary result)   Collection Time: 07/19/16  3:15 PM  Result Value Ref Range Status   Specimen Description BLOOD L AC  Final   Special Requests   Final    Blood Culture results may not be optimal due to an excessive volume of blood received in culture bottles   Culture NO GROWTH < 24 HOURS  Final   Report Status PENDING  Incomplete  MRSA PCR Screening     Status: None   Collection Time: 07/19/16  3:19 PM  Result Value Ref  Range Status   MRSA by PCR NEGATIVE NEGATIVE Final    Comment:        The GeneXpert MRSA Assay (FDA approved for NASAL specimens only), is one component of a comprehensive MRSA colonization surveillance program. It is not intended to diagnose MRSA infection nor to guide or monitor treatment for MRSA infections.     RADIOLOGY:  Dg Chest 2 View  Result Date: 07/19/2016 CLINICAL DATA:  Weakness.  Fever. EXAM: CHEST  2 VIEW COMPARISON:  03/09/2016 .  CT 12/28/2010. FINDINGS: Mediastinum hilar structures are normal. Heart size normal. Mild bibasilar atelectasis and infiltrates noted. A component basilar scarring may be present. No pleural effusion or pneumothorax. IMPRESSION: 1. Mild bibasilar atelectasis and infiltrates. A component of basilar scarring may be present . 2. Stable cardiomegaly. Electronically Signed   By: Marcello Moores  Register   On: 07/19/2016 11:47     Management plans discussed with the patient, family and they are in agreement.  CODE STATUS:     Code Status Orders        Start     Ordered   07/19/16 1441  Full code  Continuous     07/19/16 1441    Code Status History    Date Active Date Inactive Code Status Order ID Comments User Context   04/24/2016  1:42 PM 04/26/2016  8:43 PM Full Code [id number]  Gaynelle Arabian, MD Inpatient      TOTAL TIME TAKING CARE OF THIS PATIENT: *40* minutes.    Evan Rivas M.D on 07/20/2016 at 1:12 PM  Between 7am to 6pm - Pager - [phone] After 6pm go to [email or web address] - [place]  [place]  [phone]  CC: Primary care physician; Keith Rake, MD

## 2016-07-22 LAB — CULTURE, BLOOD (ROUTINE X 2)

## 2016-07-24 LAB — CULTURE, BLOOD (ROUTINE X 2)

## 2016-07-24 NOTE — Progress Notes (Signed)
Please call the patient and ask him to schedule an appointment either for today (Monday 07/24/16) or Tomorrow (07/25/16).  His blood cultures show that the Levaquin he is taking will work to kill the bacteria that caused his infection. However, we need to just check in and make sure he is improving from his hospital stay.  We also need to re-check some labs to ensure he is still improving from the hospital - he does not need to come fasting. Thank you so much!

## 2016-07-25 ENCOUNTER — Ambulatory Visit (INDEPENDENT_AMBULATORY_CARE_PROVIDER_SITE_OTHER): Payer: Medicare PPO | Admitting: Family Medicine

## 2016-07-25 ENCOUNTER — Encounter: Payer: Self-pay | Admitting: Family Medicine

## 2016-07-25 VITALS — BP 140/70 | HR 92 | Temp 98.1°F | Resp 18 | Ht 74.0 in | Wt 256.1 lb

## 2016-07-25 DIAGNOSIS — J150 Pneumonia due to Klebsiella pneumoniae: Secondary | ICD-10-CM | POA: Diagnosis not present

## 2016-07-25 DIAGNOSIS — Z09 Encounter for follow-up examination after completed treatment for conditions other than malignant neoplasm: Secondary | ICD-10-CM

## 2016-07-25 DIAGNOSIS — R8290 Unspecified abnormal findings in urine: Secondary | ICD-10-CM

## 2016-07-25 DIAGNOSIS — E119 Type 2 diabetes mellitus without complications: Secondary | ICD-10-CM

## 2016-07-25 LAB — POCT URINALYSIS DIPSTICK
Bilirubin, UA: NEGATIVE
GLUCOSE UA: NEGATIVE
Ketones, UA: 5
Nitrite, UA: NEGATIVE
PROTEIN UA: 30
Spec Grav, UA: 1.015 (ref 1.010–1.025)
UROBILINOGEN UA: 0.2 U/dL
pH, UA: 5 (ref 5.0–8.0)

## 2016-07-25 LAB — CBC
HEMATOCRIT: 36.1 % — AB (ref 37.5–51.0)
HEMOGLOBIN: 11.7 g/dL — AB (ref 13.0–17.7)
MCH: 29.8 pg (ref 26.6–33.0)
MCHC: 32.4 g/dL (ref 31.5–35.7)
MCV: 92 fL (ref 79–97)
Platelets: 377 10*3/uL (ref 150–379)
RBC: 3.93 x10E6/uL — ABNORMAL LOW (ref 4.14–5.80)
RDW: 14.4 % (ref 12.3–15.4)
WBC: 7.4 10*3/uL (ref 3.4–10.8)

## 2016-07-25 LAB — COMPREHENSIVE METABOLIC PANEL
ALBUMIN: 3.6 g/dL (ref 3.6–4.8)
ALT: 50 IU/L — AB (ref 0–44)
AST: 18 IU/L (ref 0–40)
Albumin/Globulin Ratio: 1.2 (ref 1.2–2.2)
Alkaline Phosphatase: 114 IU/L (ref 39–117)
BUN / CREAT RATIO: 19 (ref 10–24)
BUN: 18 mg/dL (ref 8–27)
Bilirubin Total: 0.3 mg/dL (ref 0.0–1.2)
CALCIUM: 9.1 mg/dL (ref 8.6–10.2)
CO2: 25 mmol/L (ref 18–29)
Chloride: 103 mmol/L (ref 96–106)
Creatinine, Ser: 0.97 mg/dL (ref 0.76–1.27)
GFR calc non Af Amer: 82 mL/min/{1.73_m2} (ref >59)
GFR, EST AFRICAN AMERICAN: 94 mL/min/{1.73_m2} (ref >59)
GLUCOSE: 189 mg/dL — AB (ref 65–99)
Globulin, Total: 3 g/dL (ref 1.5–4.5)
Potassium: 4.5 mmol/L (ref 3.5–5.2)
Sodium: 146 mmol/L — ABNORMAL HIGH (ref 134–144)
TOTAL PROTEIN: 6.6 g/dL (ref 6.0–8.5)

## 2016-07-25 NOTE — Progress Notes (Signed)
As discussed on the telephone, your Metabolic Panel and CBC look significantly improved.  I did reach out to Dr. Juleen China, a Nephrologist, and reviewed your labwork with him. He recommends re-checking your urinalysis in 3 months, and if it remains abnormal, we will refer you to him. Please complete the course of Levaquin as discussed, and keep your 1 week follow-up appointment with me.  Thank you so much for allowing me to care for you!

## 2016-07-25 NOTE — Progress Notes (Addendum)
Name: Evan Rivas   MRN: [id number]    DOB: [birth year]   Date:07/25/2016       Progress Note  Subjective  Chief Complaint  Chief Complaint  Patient presents with  . Hospitalization Follow-up    HPI  Pt presents for hospital follow-up. He was discharged on 07/20/2016 after being admitted and treated for Bibasilar pneumonia with Sepsis. He was discharged with Rx for PO Levaquin x7 days, and he is still taking this as prescribed. He reports feeling significantly better - appetite has returned, no fevers/chills (Temp at home has been 98.35F), no vomiting/nausea.  He has not been taking Prozac with Levaquin because it had caused nausea yesterday - he plans to restart after Levaquin is finished in 3 days.  Diabetes Mellitus: CBG's have been running 130-170's since being discharged. Patient is fasting today, will check with CMP. No polyuria, polydipsia, or polyphagia.   Sediment in Urine: Ptient reports seeing red "flakes" in urine that began upon discharge from hospital and subsided this morning. No dysuria, polyuria, abdominal/back/flank pain.  Pt has established urologist with [facility] that he has not seen in 2 years.    Patient Active Problem List   Diagnosis Date Noted  . Pneumonia 07/19/2016  . BPH without urinary obstruction 06/03/2015  . Depression 03/05/2015  . Hyperlipidemia 09/03/2014  . Major depressive disorder, recurrent episode, moderate ([place]) 09/03/2014  . Chronic low back pain 09/03/2014  . Arthralgia of both knees 09/03/2014  . Type 2 diabetes mellitus without complication ([place]) 62/69/4854  . Allergic rhinitis 07/04/2014  . Anxiety 07/04/2014  . Continuous opioid dependence ([place]) 07/04/2014  . DDD (degenerative disc disease), lumbar 07/04/2014  . Gout of foot 07/04/2014  . BP (high blood pressure) 07/04/2014  . Flu vaccine need 07/04/2014  . Adiposity 07/04/2014  . OA (osteoarthritis) of knee 07/04/2014  . Neuralgia neuritis, sciatic nerve 07/04/2014  .  Arthralgia of multiple joints 07/04/2014  . H/O malignant neoplasm of skin 12/24/2013    Social History  Substance Use Topics  . Smoking status: Never Smoker  . Smokeless tobacco: Never Used  . Alcohol use No     Current Outpatient Prescriptions:  .  allopurinol (ZYLOPRIM) 300 MG tablet, Take 1 tablet (300 mg total) by mouth every evening., Disp: 90 tablet, Rfl: 0 .  amLODipine (NORVASC) 10 MG tablet, Take 1 tablet (10 mg total) by mouth daily., Disp: 90 tablet, Rfl: 1 .  aspirin 81 MG tablet, Take 81 mg by mouth daily., Disp: , Rfl:  .  atorvastatin (LIPITOR) 40 MG tablet, Take 1 tablet (40 mg total) by mouth at bedtime., Disp: 90 tablet, Rfl: 0 .  clonazePAM (KLONOPIN) 1 MG tablet, Take 1 tablet (1 mg total) by mouth 2 (two) times daily as needed., Disp: 180 tablet, Rfl: 0 .  FLUoxetine (PROZAC) 40 MG capsule, Take 1 capsule (40 mg total) by mouth every morning., Disp: 90 capsule, Rfl: 1 .  fluticasone (FLONASE) 50 MCG/ACT nasal spray, Place 2 sprays into both nostrils daily. 2 sprays (Patient taking differently: Place 2 sprays into both nostrils daily as needed (for allergies/sinuses). ), Disp: 16 g, Rfl: 0 .  glipiZIDE (GLUCOTROL) 5 MG tablet, Take 1 tablet (5 mg total) by mouth daily., Disp: 90 tablet, Rfl: 0 .  HYDROcodone-acetaminophen (NORCO) 10-325 MG tablet, Take 1 tablet by mouth every 8 (eight) hours as needed., Disp: 90 tablet, Rfl: 0 .  levofloxacin (LEVAQUIN) 750 MG tablet, Take 1 tablet (750 mg total) by mouth daily., Disp: 7  tablet, Rfl: 0 .  losartan (COZAAR) 100 MG tablet, Take 1 tablet (100 mg total) by mouth at bedtime., Disp: 90 tablet, Rfl: 0 .  metFORMIN (GLUCOPHAGE-XR) 500 MG 24 hr tablet, Take 2 tablets (1,000 mg total) by mouth 2 (two) times daily. 1028m BID, Disp: 360 tablet, Rfl: 1 .  metoprolol succinate (TOPROL-XL) 100 MG 24 hr tablet, Take 1 tablet (100 mg total) by mouth every morning., Disp: 90 tablet, Rfl: 0 .  metoprolol succinate (TOPROL-XL) 25 MG 24 hr  tablet, Take 1 tablet (25 mg total) by mouth every morning., Disp: 90 tablet, Rfl: 1 .  Multiple Vitamin (MULTIVITAMINS PO), Take by mouth., Disp: , Rfl:  .  nabumetone (RELAFEN) 500 MG tablet, Take 1 tablet (500 mg total) by mouth 2 (two) times daily., Disp: 180 tablet, Rfl: 0 .  ondansetron (ZOFRAN) 4 MG tablet, Take 1 tablet (4 mg total) by mouth every 8 (eight) hours as needed for nausea or vomiting., Disp: 15 tablet, Rfl: 0 .  tamsulosin (FLOMAX) 0.4 MG CAPS capsule, Take 1 capsule (0.4 mg total) by mouth daily after supper., Disp: 90 capsule, Rfl: 0 .  tiZANidine (ZANAFLEX) 4 MG tablet, Take 1 tablet (4 mg total) by mouth at bedtime., Disp: 90 tablet, Rfl: 0 .  oseltamivir (TAMIFLU) 75 MG capsule, , Disp: , Rfl: 0  Allergies  Allergen Reactions  . Celebrex  [Celecoxib]     GI  . Tetanus Toxoids Other (See Comments)    unkown  . Tetanus-Diphtheria Toxoids Td     Other reaction(s): Unknown    ROS  Constitutional: Negative for fever or weight change.  Respiratory: Negative for cough and shortness of breath.   Cardiovascular: Negative for chest pain or palpitations.  Gastrointestinal: Negative for abdominal pain, no bowel changes.  GU: See HPI Musculoskeletal: Negative for gait problem or joint swelling.  Skin: Negative for rash.  Neurological: Negative for dizziness or headache.  No other specific complaints in a complete review of systems (except as listed in HPI above).  Objective  Vitals:   07/25/16 0956  BP: 140/70  Pulse: 92  Resp: 18  Temp: 98.1 F (36.7 C)  TempSrc: Oral  SpO2: 97%  Weight: 256 lb 1.6 oz (116.2 kg)  Height: '6\' 2"'  (1.88 m)    Body mass index is 32.88 kg/m.  Nursing Note and Vital Signs reviewed.  Physical Exam  Constitutional: Patient appears well-developed and well-nourished. Obese  No distress.  HEENT: head atraumatic, normocephalic, pupils equal and reactive to light, EOM's intact, TM's without erythema or bulging, no maxillary or  frontal sinus pain on palpation, neck supple without lymphadenopathy, oropharynx pink and moist without exudate Cardiovascular: Normal rate, regular rhythm, S1/S2 present.  No murmur or rub heard. BLE +2 edema (chronic issue). Pulmonary/Chest: Effort normal and breath sounds clear. No respiratory distress or retractions. Abdominal: Soft and non-tender, bowel sounds present x4 quadrants.  No CVA tenderness Psychiatric: Patient has a normal mood and affect. behavior is normal. Judgment and thought content normal.  Recent Results (from the past 2160 hour(s))  Glucose, capillary     Status: Abnormal   Collection Time: 04/26/16 11:45 AM  Result Value Ref Range   Glucose-Capillary 231 (H) 65 - 99 mg/dL  Basic metabolic panel     Status: Abnormal   Collection Time: 04/26/16  2:34 PM  Result Value Ref Range   Sodium 139 135 - 145 mmol/L   Potassium 3.4 (L) 3.5 - 5.1 mmol/L   Chloride 104 101 -  111 mmol/L   CO2 27 22 - 32 mmol/L   Glucose, Bld 235 (H) 65 - 99 mg/dL   BUN 14 6 - 20 mg/dL   Creatinine, Ser 0.97 0.61 - 1.24 mg/dL   Calcium 8.5 (L) 8.9 - 10.3 mg/dL   GFR calc non Af Amer >60 >60 mL/min   GFR calc Af Amer >60 >60 mL/min    Comment: (NOTE) The eGFR has been calculated using the CKD EPI equation. This calculation has not been validated in all clinical situations. eGFR's persistently <60 mL/min signify possible Chronic Kidney Disease.    Anion gap 8 5 - 15  POCT HgB A1C     Status: None   Collection Time: 06/09/16  8:22 AM  Result Value Ref Range   Hemoglobin A1C 6.9   POCT Glucose (CBG)     Status: Abnormal   Collection Time: 06/09/16  8:23 AM  Result Value Ref Range   POC Glucose 165 (A) 70 - 99 mg/dl  Lipid Panel w/o Chol/HDL Ratio     Status: Abnormal   Collection Time: 06/09/16  9:09 AM  Result Value Ref Range   Cholesterol, Total 145 100 - 199 mg/dL   Triglycerides 106 0 - 149 mg/dL   HDL 37 (L) >39 mg/dL   VLDL Cholesterol Cal 21 5 - 40 mg/dL   LDL Calculated 87 0  - 99 mg/dL  POCT Influenza A/B     Status: None   Collection Time: 07/18/16 10:28 AM  Result Value Ref Range   Influenza A, POC Negative Negative   Influenza B, POC Negative Negative  CBC     Status: Abnormal   Collection Time: 07/18/16 11:18 AM  Result Value Ref Range   WBC 21.6 (HH) 3.4 - 10.8 x10E3/uL   RBC 4.15 4.14 - 5.80 x10E6/uL   Hemoglobin 12.9 (L) 13.0 - 17.7 g/dL   Hematocrit 38.7 37.5 - 51.0 %   MCV 93 79 - 97 fL   MCH 31.1 26.6 - 33.0 pg   MCHC 33.3 31.5 - 35.7 g/dL   RDW 14.9 12.3 - 15.4 %   Platelets 269 150 - 379 x10E3/uL  CBC     Status: Abnormal   Collection Time: 07/19/16 10:19 AM  Result Value Ref Range   WBC 19.6 (H) 3.8 - 10.6 K/uL   RBC 4.07 (L) 4.40 - 5.90 MIL/uL   Hemoglobin 12.4 (L) 13.0 - 18.0 g/dL   HCT 36.4 (L) 40.0 - 52.0 %   MCV 89.5 80.0 - 100.0 fL   MCH 30.4 26.0 - 34.0 pg   MCHC 34.0 32.0 - 36.0 g/dL   RDW 15.1 (H) 11.5 - 14.5 %   Platelets 260 150 - 440 K/uL  POCT Influenza A/B     Status: None   Collection Time: 07/19/16 11:14 AM  Result Value Ref Range   Influenza A, POC Negative Negative   Influenza B, POC Negative Negative  Basic metabolic panel     Status: Abnormal   Collection Time: 07/19/16  3:12 PM  Result Value Ref Range   Sodium 134 (L) 135 - 145 mmol/L   Potassium 2.4 (LL) 3.5 - 5.1 mmol/L    Comment: CRITICAL RESULT CALLED TO, READ BACK BY AND VERIFIED WITH STEVE IMHOFF AT 1601 ON 07/19/16 BY SNJ    Chloride 100 (L) 101 - 111 mmol/L   CO2 23 22 - 32 mmol/L   Glucose, Bld 302 (H) 65 - 99 mg/dL   BUN 25 (H) 6 -  20 mg/dL   Creatinine, Ser 1.71 (H) 0.61 - 1.24 mg/dL   Calcium 8.1 (L) 8.9 - 10.3 mg/dL   GFR calc non Af Amer 40 (L) >60 mL/min   GFR calc Af Amer 47 (L) >60 mL/min    Comment: (NOTE) The eGFR has been calculated using the CKD EPI equation. This calculation has not been validated in all clinical situations. eGFR's persistently <60 mL/min signify possible Chronic Kidney Disease.    Anion gap 11 5 - 15   Magnesium     Status: Abnormal   Collection Time: 07/19/16  3:12 PM  Result Value Ref Range   Magnesium 1.3 (L) 1.7 - 2.4 mg/dL  Lactic acid, plasma     Status: None   Collection Time: 07/19/16  3:13 PM  Result Value Ref Range   Lactic Acid, Venous 1.5 0.5 - 1.9 mmol/L  CBC     Status: Abnormal   Collection Time: 07/19/16  3:13 PM  Result Value Ref Range   WBC 15.5 (H) 3.8 - 10.6 K/uL   RBC 3.84 (L) 4.40 - 5.90 MIL/uL   Hemoglobin 11.8 (L) 13.0 - 18.0 g/dL   HCT 34.5 (L) 40.0 - 52.0 %   MCV 89.7 80.0 - 100.0 fL   MCH 30.6 26.0 - 34.0 pg   MCHC 34.1 32.0 - 36.0 g/dL   RDW 14.7 (H) 11.5 - 14.5 %   Platelets 220 150 - 440 K/uL  CULTURE, BLOOD (ROUTINE X 2) w Reflex to ID Panel     Status: Abnormal   Collection Time: 07/19/16  3:14 PM  Result Value Ref Range   Specimen Description BLOOD R AC    Special Requests      Blood Culture results may not be optimal due to an excessive volume of blood received in culture bottles   Culture  Setup Time      GRAM NEGATIVE RODS AEROBIC BOTTLE ONLY CRITICAL RESULT CALLED TO, READ BACK BY AND VERIFIED WITH:  CHRISTINE KATSOUDAS AT [place] 07/20/16 SDR Performed at [facility], [place] [address] [place], [place] [address]    Culture KLEBSIELLA PNEUMONIAE (A)    Report Status 07/22/2016 FINAL    Organism ID, Bacteria KLEBSIELLA PNEUMONIAE       Susceptibility   Klebsiella pneumoniae - MIC*    AMPICILLIN RESISTANT Resistant     CEFAZOLIN <=4 SENSITIVE Sensitive     CEFEPIME <=1 SENSITIVE Sensitive     CEFTAZIDIME <=1 SENSITIVE Sensitive     CEFTRIAXONE <=1 SENSITIVE Sensitive     CIPROFLOXACIN <=0.25 SENSITIVE Sensitive     GENTAMICIN <=1 SENSITIVE Sensitive     IMIPENEM <=0.25 SENSITIVE Sensitive     TRIMETH/SULFA <=20 SENSITIVE Sensitive     AMPICILLIN/SULBACTAM 4 SENSITIVE Sensitive     PIP/TAZO <=4 SENSITIVE Sensitive     * KLEBSIELLA PNEUMONIAE  Blood Culture ID Panel (Reflexed)     Status: Abnormal   Collection Time: 07/19/16   3:14 PM  Result Value Ref Range   Enterococcus species NOT DETECTED NOT DETECTED   Listeria monocytogenes NOT DETECTED NOT DETECTED   Staphylococcus species NOT DETECTED NOT DETECTED   Staphylococcus aureus NOT DETECTED NOT DETECTED   Streptococcus species NOT DETECTED NOT DETECTED   Streptococcus agalactiae NOT DETECTED NOT DETECTED   Streptococcus pneumoniae NOT DETECTED NOT DETECTED   Streptococcus pyogenes NOT DETECTED NOT DETECTED   Acinetobacter baumannii NOT DETECTED NOT DETECTED   Enterobacteriaceae species DETECTED (A) NOT DETECTED    Comment: Enterobacteriaceae represent a  large family of gram-negative bacteria, not a single organism. CRITICAL RESULT CALLED TO, READ BACK BY AND VERIFIED WITH:  CHRISTINE KATSOUDAS AT 0945 07/19/16 SDR    Enterobacter cloacae complex NOT DETECTED NOT DETECTED   Escherichia coli NOT DETECTED NOT DETECTED   Klebsiella oxytoca NOT DETECTED NOT DETECTED   Klebsiella pneumoniae DETECTED (A) NOT DETECTED    Comment: CRITICAL RESULT CALLED TO, READ BACK BY AND VERIFIED WITH:  CHRISTINE KATSOUDAS AT 0945 07/20/16 SDR    Proteus species NOT DETECTED NOT DETECTED   Serratia marcescens NOT DETECTED NOT DETECTED   Carbapenem resistance NOT DETECTED NOT DETECTED   Haemophilus influenzae NOT DETECTED NOT DETECTED   Neisseria meningitidis NOT DETECTED NOT DETECTED   Pseudomonas aeruginosa NOT DETECTED NOT DETECTED   Candida albicans NOT DETECTED NOT DETECTED   Candida glabrata NOT DETECTED NOT DETECTED   Candida krusei NOT DETECTED NOT DETECTED   Candida parapsilosis NOT DETECTED NOT DETECTED   Candida tropicalis NOT DETECTED NOT DETECTED  CULTURE, BLOOD (ROUTINE X 2) w Reflex to ID Panel     Status: Abnormal   Collection Time: 07/19/16  3:15 PM  Result Value Ref Range   Specimen Description BLOOD L AC    Special Requests      Blood Culture results may not be optimal due to an excessive volume of blood received in culture bottles   Culture  Setup Time       [place].  VALUE IS CONSISTENT WITH PREVIOUSLY REPORTED AND CALLED VALUE. CONFIRMED BY [facility]    Culture (A)     KLEBSIELLA PNEUMONIAE SUSCEPTIBILITIES PERFORMED ON PREVIOUS CULTURE WITHIN THE LAST 5 DAYS. Performed at [facility], [place] [address] [place], [place] [address]    Report Status 07/24/2016 FINAL   MRSA PCR Screening     Status: None   Collection Time: 07/19/16  3:19 PM  Result Value Ref Range   MRSA by PCR NEGATIVE NEGATIVE    Comment:        The GeneXpert MRSA Assay (FDA approved for NASAL specimens only), is one component of a comprehensive MRSA colonization surveillance program. It is not intended to diagnose MRSA infection nor to guide or monitor treatment for MRSA infections.   Glucose, capillary     Status: Abnormal   Collection Time: 07/19/16  4:47 PM  Result Value Ref Range   Glucose-Capillary 260 (H) 65 - 99 mg/dL  Glucose, capillary     Status: Abnormal   Collection Time: 07/19/16  9:27 PM  Result Value Ref Range   Glucose-Capillary 261 (H) 65 - 99 mg/dL   Comment 1 Notify RN    Comment 2 Document in Chart   Basic metabolic panel     Status: Abnormal   Collection Time: 07/20/16  4:05 AM  Result Value Ref Range   Sodium 137 135 - 145 mmol/L   Potassium 3.7 3.5 - 5.1 mmol/L   Chloride 103 101 - 111 mmol/L   CO2 25 22 - 32 mmol/L   Glucose, Bld 245 (H) 65 - 99 mg/dL   BUN 26 (H) 6 - 20 mg/dL   Creatinine, Ser 1.54 (H) 0.61 - 1.24 mg/dL   Calcium 7.9 (L) 8.9 - 10.3 mg/dL   GFR calc non Af Amer 46 (L) >60 mL/min   GFR calc Af Amer 53 (L) >60 mL/min    Comment: (NOTE) The eGFR has been calculated using the CKD EPI equation. This calculation has not  been validated in all clinical situations. eGFR's persistently <60 mL/min signify possible Chronic Kidney Disease.    Anion gap 9 5 - 15  CBC     Status: Abnormal   Collection Time: 07/20/16  4:05 AM  Result Value Ref Range   WBC 12.0 (H)  3.8 - 10.6 K/uL   RBC 3.75 (L) 4.40 - 5.90 MIL/uL   Hemoglobin 11.3 (L) 13.0 - 18.0 g/dL   HCT 34.3 (L) 40.0 - 52.0 %   MCV 91.6 80.0 - 100.0 fL   MCH 30.1 26.0 - 34.0 pg   MCHC 32.9 32.0 - 36.0 g/dL   RDW 14.9 (H) 11.5 - 14.5 %   Platelets 244 150 - 440 K/uL  Glucose, capillary     Status: Abnormal   Collection Time: 07/20/16  7:39 AM  Result Value Ref Range   Glucose-Capillary 208 (H) 65 - 99 mg/dL  Glucose, capillary     Status: Abnormal   Collection Time: 07/20/16 11:38 AM  Result Value Ref Range   Glucose-Capillary 239 (H) 65 - 99 mg/dL   Comment 1 Notify RN   POCT urinalysis dipstick     Status: Abnormal   Collection Time: 07/25/16 10:34 AM  Result Value Ref Range   Color, UA brown    Clarity, UA clear    Glucose, UA negative    Bilirubin, UA negative    Ketones, UA 5    Spec Grav, UA 1.015 1.010 - 1.025   Blood, UA large    pH, UA 5.0 5.0 - 8.0   Protein, UA 30    Urobilinogen, UA 0.2 0.2 or 1.0 E.U./dL   Nitrite, UA negative    Leukocytes, UA Trace (A) Negative     Assessment & Plan  1. Hospital discharge follow-up - CBC - COMPLETE METABOLIC PANEL WITH GFR -Advance activity as tolerated.  2. Abnormal urine sediment - POCT urinalysis dipstick - Urine Culture - Urinalysis, microscopic only -Advised patient to await STAT CMP results before taking Levaquin today - will call as soon as results are back with further instructions - explained concern for nephrotic injury and possibility of readmission and/or urgent nephrology referral based on CMP results. Patient comfortable with this plan of care.  3. Pneumonia of both lower lobes due to Klebsiella pneumoniae (HCC) - CBC - COMPLETE METABOLIC PANEL WITH GFR  4. Type 2 diabetes mellitus without complication, without long-term current use of insulin (HCC) - COMPLETE METABOLIC PANEL WITH GFR  -Red flags and when to present for emergency care or RTC including new/worsening/un-resolving symptoms, fever >101.1F,  chest pain, shortness of breath, and abdominal pain  reviewed with patient at time of visit. Follow up and care instructions discussed and provided in AVS.  -Reviewed Health Maintenance: Follow up with Dr. Manuella Ghazi on 09/11/2016  I have reviewed this encounter including the documentation in this note and/or discussed this patient with the Johney Maine, FNP, NP-C. I am certifying that I agree with the content of this note as supervising physician.  Steele Sizer, MD [facility] 07/27/2016, 1:10 PM

## 2016-07-27 LAB — URINE CULTURE

## 2016-07-27 LAB — URINALYSIS, MICROSCOPIC ONLY

## 2016-08-01 ENCOUNTER — Ambulatory Visit (INDEPENDENT_AMBULATORY_CARE_PROVIDER_SITE_OTHER): Payer: Medicare PPO | Admitting: Family Medicine

## 2016-08-01 ENCOUNTER — Encounter: Payer: Self-pay | Admitting: Family Medicine

## 2016-08-01 VITALS — BP 126/72 | HR 84 | Temp 98.1°F | Resp 18 | Ht 74.0 in | Wt 250.9 lb

## 2016-08-01 DIAGNOSIS — J150 Pneumonia due to Klebsiella pneumoniae: Secondary | ICD-10-CM

## 2016-08-01 DIAGNOSIS — Z09 Encounter for follow-up examination after completed treatment for conditions other than malignant neoplasm: Secondary | ICD-10-CM

## 2016-08-01 NOTE — Progress Notes (Signed)
126/72 

## 2016-08-01 NOTE — Progress Notes (Addendum)
Name: Evan Rivas   MRN: [id number]    DOB: 05-24-1950   Date:08/01/2016       Progress Note  Subjective  Chief Complaint  Chief Complaint  Patient presents with  . Follow-up    1 week recheck    HPI  Pt presents for follow up s/p hospitalization for Klebsiella PNA with sepsis.  He reports feeling significantly better and has no complaints today. Also notes that his urine has returned to a pale yellow, clear color without any red flakes/sediment. Denies chest pain, shortness of breath, body aches, fevers/chills. Completed Levaquin course without issues; back to taking regular medications without issue. Returned to the gym yesterday, and did 10 minutes on the stair climber, and some light strengthening.  Reports appetite has returned to normal but denies polyphagia, polydipsia, or polyuria. Blood sugars have been running in the 150's-160's at home.  Patient's SpO2 is 95% today. Review of previous visits show that this has been baseline.  Denies shortness of breath, history of smoking, or OSA.  Patient has right knee replacement scheduled on October 23, 2016 and is looking forward to this.  Patient Active Problem List   Diagnosis Date Noted  . Pneumonia 07/19/2016  . BPH without urinary obstruction 06/03/2015  . Depression 03/05/2015  . Hyperlipidemia 09/03/2014  . Major depressive disorder, recurrent episode, moderate ([place]) 09/03/2014  . Chronic low back pain 09/03/2014  . Arthralgia of both knees 09/03/2014  . Type 2 diabetes mellitus without complication ([place]) 40/11/6759  . Allergic rhinitis 07/04/2014  . Anxiety 07/04/2014  . Continuous opioid dependence ([place]) 07/04/2014  . DDD (degenerative disc disease), lumbar 07/04/2014  . Gout of foot 07/04/2014  . BP (high blood pressure) 07/04/2014  . Flu vaccine need 07/04/2014  . Adiposity 07/04/2014  . OA (osteoarthritis) of knee 07/04/2014  . Neuralgia neuritis, sciatic nerve 07/04/2014  . Arthralgia of multiple joints  07/04/2014  . H/O malignant neoplasm of skin 12/24/2013    Social History  Substance Use Topics  . Smoking status: Never Smoker  . Smokeless tobacco: Never Used  . Alcohol use No    Current Outpatient Prescriptions:  .  allopurinol (ZYLOPRIM) 300 MG tablet, Take 1 tablet (300 mg total) by mouth every evening., Disp: 90 tablet, Rfl: 0 .  amLODipine (NORVASC) 10 MG tablet, Take 1 tablet (10 mg total) by mouth daily., Disp: 90 tablet, Rfl: 1 .  aspirin 81 MG tablet, Take 81 mg by mouth daily., Disp: , Rfl:  .  atorvastatin (LIPITOR) 40 MG tablet, Take 1 tablet (40 mg total) by mouth at bedtime., Disp: 90 tablet, Rfl: 0 .  clonazePAM (KLONOPIN) 1 MG tablet, Take 1 tablet (1 mg total) by mouth 2 (two) times daily as needed., Disp: 180 tablet, Rfl: 0 .  FLUoxetine (PROZAC) 40 MG capsule, Take 1 capsule (40 mg total) by mouth every morning., Disp: 90 capsule, Rfl: 1 .  fluticasone (FLONASE) 50 MCG/ACT nasal spray, Place 2 sprays into both nostrils daily. 2 sprays (Patient taking differently: Place 2 sprays into both nostrils daily as needed (for allergies/sinuses). ), Disp: 16 g, Rfl: 0 .  glipiZIDE (GLUCOTROL) 5 MG tablet, Take 1 tablet (5 mg total) by mouth daily., Disp: 90 tablet, Rfl: 0 .  HYDROcodone-acetaminophen (NORCO) 10-325 MG tablet, Take 1 tablet by mouth every 8 (eight) hours as needed., Disp: 90 tablet, Rfl: 0 .  losartan (COZAAR) 100 MG tablet, Take 1 tablet (100 mg total) by mouth at bedtime., Disp: 90 tablet, Rfl: 0 .  metFORMIN (GLUCOPHAGE-XR) 500 MG 24 hr tablet, Take 2 tablets (1,000 mg total) by mouth 2 (two) times daily. 1028m BID, Disp: 360 tablet, Rfl: 1 .  metoprolol succinate (TOPROL-XL) 100 MG 24 hr tablet, Take 1 tablet (100 mg total) by mouth every morning., Disp: 90 tablet, Rfl: 0 .  metoprolol succinate (TOPROL-XL) 25 MG 24 hr tablet, Take 1 tablet (25 mg total) by mouth every morning., Disp: 90 tablet, Rfl: 1 .  Multiple Vitamin (MULTIVITAMINS PO), Take by mouth.,  Disp: , Rfl:  .  nabumetone (RELAFEN) 500 MG tablet, Take 1 tablet (500 mg total) by mouth 2 (two) times daily., Disp: 180 tablet, Rfl: 0 .  tamsulosin (FLOMAX) 0.4 MG CAPS capsule, Take 1 capsule (0.4 mg total) by mouth daily after supper., Disp: 90 capsule, Rfl: 0 .  tiZANidine (ZANAFLEX) 4 MG tablet, Take 1 tablet (4 mg total) by mouth at bedtime., Disp: 90 tablet, Rfl: 0  Allergies  Allergen Reactions  . Celebrex  [Celecoxib]     GI  . Tetanus Toxoids Other (See Comments)    unkown  . Tetanus-Diphtheria Toxoids Td     Other reaction(s): Unknown    ROS  Constitutional: Negative for fever or weight change.  Respiratory: Negative for cough and shortness of breath.   Cardiovascular: Negative for chest pain or palpitations.  Gastrointestinal: Negative for abdominal pain, no bowel changes.  Musculoskeletal: Negative for gait problem or joint swelling.  Skin: Negative for rash.  Neurological: Negative for dizziness or headache.  No other specific complaints in a complete review of systems (except as listed in HPI above).  Objective  Vitals:   08/01/16 0859  BP: 126/72  Pulse: 84  Resp: 18  Temp: 98.1 F (36.7 C)  TempSrc: Oral  SpO2: 95%  Weight: 250 lb 14.4 oz (113.8 kg)  Height: '6\' 2"'  (1.88 m)    Body mass index is 32.21 kg/m.  Nursing Note and Vital Signs reviewed.  Physical Exam  Constitutional: Patient appears well-developed and well-nourished. Obese No distress.  HEENT: head atraumatic, normocephalic, pupils equal and reactive to light, TM's without erythema or bulging, no maxillary or frontal sinus pain on palpation, neck supple without lymphadenopathy, oropharynx pink and moist without exudate Cardiovascular: Normal rate, regular rhythm, S1/S2 present.  No murmur or rub heard. +1 BLE edema - pt reports not wearing compression socks today, but has been wearing otherwise. Pulmonary/Chest: Effort normal and breath sounds clear. No respiratory distress or  retractions. Abdominal: Soft and non-tender, bowel sounds present x4 quadrants. Psychiatric: Patient has a normal mood and affect. behavior is normal. Judgment and thought content normal.  Recent Results (from the past 2160 hour(s))  POCT HgB A1C     Status: None   Collection Time: 06/09/16  8:22 AM  Result Value Ref Range   Hemoglobin A1C 6.9   POCT Glucose (CBG)     Status: Abnormal   Collection Time: 06/09/16  8:23 AM  Result Value Ref Range   POC Glucose 165 (A) 70 - 99 mg/dl  Lipid Panel w/o Chol/HDL Ratio     Status: Abnormal   Collection Time: 06/09/16  9:09 AM  Result Value Ref Range   Cholesterol, Total 145 100 - 199 mg/dL   Triglycerides 106 0 - 149 mg/dL   HDL 37 (L) >39 mg/dL   VLDL Cholesterol Cal 21 5 - 40 mg/dL   LDL Calculated 87 0 - 99 mg/dL  POCT Influenza A/B     Status: None   Collection Time:  07/18/16 10:28 AM  Result Value Ref Range   Influenza A, POC Negative Negative   Influenza B, POC Negative Negative  CBC     Status: Abnormal   Collection Time: 07/18/16 11:18 AM  Result Value Ref Range   WBC 21.6 (HH) 3.4 - 10.8 x10E3/uL   RBC 4.15 4.14 - 5.80 x10E6/uL   Hemoglobin 12.9 (L) 13.0 - 17.7 g/dL   Hematocrit 38.7 37.5 - 51.0 %   MCV 93 79 - 97 fL   MCH 31.1 26.6 - 33.0 pg   MCHC 33.3 31.5 - 35.7 g/dL   RDW 14.9 12.3 - 15.4 %   Platelets 269 150 - 379 x10E3/uL  CBC     Status: Abnormal   Collection Time: 07/19/16 10:19 AM  Result Value Ref Range   WBC 19.6 (H) 3.8 - 10.6 K/uL   RBC 4.07 (L) 4.40 - 5.90 MIL/uL   Hemoglobin 12.4 (L) 13.0 - 18.0 g/dL   HCT 36.4 (L) 40.0 - 52.0 %   MCV 89.5 80.0 - 100.0 fL   MCH 30.4 26.0 - 34.0 pg   MCHC 34.0 32.0 - 36.0 g/dL   RDW 15.1 (H) 11.5 - 14.5 %   Platelets 260 150 - 440 K/uL  POCT Influenza A/B     Status: None   Collection Time: 07/19/16 11:14 AM  Result Value Ref Range   Influenza A, POC Negative Negative   Influenza B, POC Negative Negative  Basic metabolic panel     Status: Abnormal   Collection  Time: 07/19/16  3:12 PM  Result Value Ref Range   Sodium 134 (L) 135 - 145 mmol/L   Potassium 2.4 (LL) 3.5 - 5.1 mmol/L    Comment: CRITICAL RESULT CALLED TO, READ BACK BY AND VERIFIED WITH STEVE IMHOFF AT 1601 ON 07/19/16 BY SNJ    Chloride 100 (L) 101 - 111 mmol/L   CO2 23 22 - 32 mmol/L   Glucose, Bld 302 (H) 65 - 99 mg/dL   BUN 25 (H) 6 - 20 mg/dL   Creatinine, Ser 1.71 (H) 0.61 - 1.24 mg/dL   Calcium 8.1 (L) 8.9 - 10.3 mg/dL   GFR calc non Af Amer 40 (L) >60 mL/min   GFR calc Af Amer 47 (L) >60 mL/min    Comment: (NOTE) The eGFR has been calculated using the CKD EPI equation. This calculation has not been validated in all clinical situations. eGFR's persistently <60 mL/min signify possible Chronic Kidney Disease.    Anion gap 11 5 - 15  Magnesium     Status: Abnormal   Collection Time: 07/19/16  3:12 PM  Result Value Ref Range   Magnesium 1.3 (L) 1.7 - 2.4 mg/dL  Lactic acid, plasma     Status: None   Collection Time: 07/19/16  3:13 PM  Result Value Ref Range   Lactic Acid, Venous 1.5 0.5 - 1.9 mmol/L  CBC     Status: Abnormal   Collection Time: 07/19/16  3:13 PM  Result Value Ref Range   WBC 15.5 (H) 3.8 - 10.6 K/uL   RBC 3.84 (L) 4.40 - 5.90 MIL/uL   Hemoglobin 11.8 (L) 13.0 - 18.0 g/dL   HCT 34.5 (L) 40.0 - 52.0 %   MCV 89.7 80.0 - 100.0 fL   MCH 30.6 26.0 - 34.0 pg   MCHC 34.1 32.0 - 36.0 g/dL   RDW 14.7 (H) 11.5 - 14.5 %   Platelets 220 150 - 440 K/uL  CULTURE, BLOOD (ROUTINE X 2)  w Reflex to ID Panel     Status: Abnormal   Collection Time: 07/19/16  3:14 PM  Result Value Ref Range   Specimen Description BLOOD R AC    Special Requests      Blood Culture results may not be optimal due to an excessive volume of blood received in culture bottles   Culture  Setup Time      GRAM NEGATIVE RODS AEROBIC BOTTLE ONLY CRITICAL RESULT CALLED TO, READ BACK BY AND VERIFIED WITH:  CHRISTINE KATSOUDAS AT [place] 07/20/16 SDR Performed at [facility], [place] [address] [place], [place] [address]    Culture KLEBSIELLA PNEUMONIAE (A)    Report Status 07/22/2016 FINAL    Organism ID, Bacteria KLEBSIELLA PNEUMONIAE       Susceptibility   Klebsiella pneumoniae - MIC*    AMPICILLIN RESISTANT Resistant     CEFAZOLIN <=4 SENSITIVE Sensitive     CEFEPIME <=1 SENSITIVE Sensitive     CEFTAZIDIME <=1 SENSITIVE Sensitive     CEFTRIAXONE <=1 SENSITIVE Sensitive     CIPROFLOXACIN <=0.25 SENSITIVE Sensitive     GENTAMICIN <=1 SENSITIVE Sensitive     IMIPENEM <=0.25 SENSITIVE Sensitive     TRIMETH/SULFA <=20 SENSITIVE Sensitive     AMPICILLIN/SULBACTAM 4 SENSITIVE Sensitive     PIP/TAZO <=4 SENSITIVE Sensitive     * KLEBSIELLA PNEUMONIAE  Blood Culture ID Panel (Reflexed)     Status: Abnormal   Collection Time: 07/19/16  3:14 PM  Result Value Ref Range   Enterococcus species NOT DETECTED NOT DETECTED   Listeria monocytogenes NOT DETECTED NOT DETECTED   Staphylococcus species NOT DETECTED NOT DETECTED   Staphylococcus aureus NOT DETECTED NOT DETECTED   Streptococcus species NOT DETECTED NOT DETECTED   Streptococcus agalactiae NOT DETECTED NOT DETECTED   Streptococcus pneumoniae NOT DETECTED NOT DETECTED   Streptococcus pyogenes NOT DETECTED NOT DETECTED   Acinetobacter baumannii NOT DETECTED NOT DETECTED   Enterobacteriaceae species DETECTED (A) NOT DETECTED    Comment: Enterobacteriaceae represent a large family of gram-negative bacteria, not a single organism. CRITICAL RESULT CALLED TO, READ BACK BY AND VERIFIED WITH:  CHRISTINE KATSOUDAS AT 0945 07/19/16 SDR    Enterobacter cloacae complex NOT DETECTED NOT DETECTED   Escherichia coli NOT DETECTED NOT DETECTED   Klebsiella oxytoca NOT DETECTED NOT DETECTED   Klebsiella pneumoniae DETECTED (A) NOT DETECTED    Comment: CRITICAL RESULT CALLED TO, READ BACK BY AND VERIFIED WITH:  CHRISTINE KATSOUDAS AT 0945 07/20/16 SDR    Proteus species NOT DETECTED NOT DETECTED   Serratia marcescens NOT DETECTED NOT  DETECTED   Carbapenem resistance NOT DETECTED NOT DETECTED   Haemophilus influenzae NOT DETECTED NOT DETECTED   Neisseria meningitidis NOT DETECTED NOT DETECTED   Pseudomonas aeruginosa NOT DETECTED NOT DETECTED   Candida albicans NOT DETECTED NOT DETECTED   Candida glabrata NOT DETECTED NOT DETECTED   Candida krusei NOT DETECTED NOT DETECTED   Candida parapsilosis NOT DETECTED NOT DETECTED   Candida tropicalis NOT DETECTED NOT DETECTED  CULTURE, BLOOD (ROUTINE X 2) w Reflex to ID Panel     Status: Abnormal   Collection Time: 07/19/16  3:15 PM  Result Value Ref Range   Specimen Description BLOOD L AC    Special Requests      Blood Culture results may not be optimal due to an excessive volume of blood received in culture bottles   Culture  Setup Time      GRAM NEGATIVE RODS AEROBIC BOTTLE ONLY  CRITICAL VALUE NOTED.  VALUE IS CONSISTENT WITH PREVIOUSLY REPORTED AND CALLED VALUE. CONFIRMED BY [facility]    Culture (A)     KLEBSIELLA PNEUMONIAE SUSCEPTIBILITIES PERFORMED ON PREVIOUS CULTURE WITHIN THE LAST 5 DAYS. Performed at [facility], [place] [address] [place], [place] [address]    Report Status 07/24/2016 FINAL   MRSA PCR Screening     Status: None   Collection Time: 07/19/16  3:19 PM  Result Value Ref Range   MRSA by PCR NEGATIVE NEGATIVE    Comment:        The GeneXpert MRSA Assay (FDA approved for NASAL specimens only), is one component of a comprehensive MRSA colonization surveillance program. It is not intended to diagnose MRSA infection nor to guide or monitor treatment for MRSA infections.   Glucose, capillary     Status: Abnormal   Collection Time: 07/19/16  4:47 PM  Result Value Ref Range   Glucose-Capillary 260 (H) 65 - 99 mg/dL  Glucose, capillary     Status: Abnormal   Collection Time: 07/19/16  9:27 PM  Result Value Ref Range   Glucose-Capillary 261 (H) 65 - 99 mg/dL   Comment 1 Notify RN    Comment 2 Document in Chart   Basic metabolic panel      Status: Abnormal   Collection Time: 07/20/16  4:05 AM  Result Value Ref Range   Sodium 137 135 - 145 mmol/L   Potassium 3.7 3.5 - 5.1 mmol/L   Chloride 103 101 - 111 mmol/L   CO2 25 22 - 32 mmol/L   Glucose, Bld 245 (H) 65 - 99 mg/dL   BUN 26 (H) 6 - 20 mg/dL   Creatinine, Ser 1.54 (H) 0.61 - 1.24 mg/dL   Calcium 7.9 (L) 8.9 - 10.3 mg/dL   GFR calc non Af Amer 46 (L) >60 mL/min   GFR calc Af Amer 53 (L) >60 mL/min    Comment: (NOTE) The eGFR has been calculated using the CKD EPI equation. This calculation has not been validated in all clinical situations. eGFR's persistently <60 mL/min signify possible Chronic Kidney Disease.    Anion gap 9 5 - 15  CBC     Status: Abnormal   Collection Time: 07/20/16  4:05 AM  Result Value Ref Range   WBC 12.0 (H) 3.8 - 10.6 K/uL   RBC 3.75 (L) 4.40 - 5.90 MIL/uL   Hemoglobin 11.3 (L) 13.0 - 18.0 g/dL   HCT 34.3 (L) 40.0 - 52.0 %   MCV 91.6 80.0 - 100.0 fL   MCH 30.1 26.0 - 34.0 pg   MCHC 32.9 32.0 - 36.0 g/dL   RDW 14.9 (H) 11.5 - 14.5 %   Platelets 244 150 - 440 K/uL  Glucose, capillary     Status: Abnormal   Collection Time: 07/20/16  7:39 AM  Result Value Ref Range   Glucose-Capillary 208 (H) 65 - 99 mg/dL  Glucose, capillary     Status: Abnormal   Collection Time: 07/20/16 11:38 AM  Result Value Ref Range   Glucose-Capillary 239 (H) 65 - 99 mg/dL   Comment 1 Notify RN   Urinalysis, microscopic only     Status: None   Collection Time: 07/25/16 12:00 AM  Result Value Ref Range   WBC, UA CANCELED /hpf    Comment: Specimen unsuitable for analysis for Urinalysis due to documented adverse affects of boric acid preservative on urinalysis results. Please resubmit urine in a urinalysis preservative specimen collection tube.  Result  canceled by the ancillary   Urine culture     Status: None   Collection Time: 07/25/16 12:00 AM  Result Value Ref Range   Urine Culture, Routine Final report    Urine Culture result 1 Comment      Comment: Culture shows less than 10,000 colony forming units of bacteria per milliliter of urine. This colony count is not generally considered to be clinically significant.   POCT urinalysis dipstick     Status: Abnormal   Collection Time: 07/25/16 10:34 AM  Result Value Ref Range   Color, UA brown    Clarity, UA clear    Glucose, UA negative    Bilirubin, UA negative    Ketones, UA 5    Spec Grav, UA 1.015 1.010 - 1.025   Blood, UA large    pH, UA 5.0 5.0 - 8.0   Protein, UA 30    Urobilinogen, UA 0.2 0.2 or 1.0 E.U./dL   Nitrite, UA negative    Leukocytes, UA Trace (A) Negative  Comprehensive metabolic panel     Status: Abnormal   Collection Time: 07/25/16 10:57 AM  Result Value Ref Range   Glucose 189 (H) 65 - 99 mg/dL   BUN 18 8 - 27 mg/dL   Creatinine, Ser 0.97 0.76 - 1.27 mg/dL   GFR calc non Af Amer 82 >59 mL/min/1.73   GFR calc Af Amer 94 >59 mL/min/1.73   BUN/Creatinine Ratio 19 10 - 24   Sodium 146 (H) 134 - 144 mmol/L   Potassium 4.5 3.5 - 5.2 mmol/L   Chloride 103 96 - 106 mmol/L   CO2 25 18 - 29 mmol/L   Calcium 9.1 8.6 - 10.2 mg/dL   Total Protein 6.6 6.0 - 8.5 g/dL   Albumin 3.6 3.6 - 4.8 g/dL   Globulin, Total 3.0 1.5 - 4.5 g/dL   Albumin/Globulin Ratio 1.2 1.2 - 2.2   Bilirubin Total 0.3 0.0 - 1.2 mg/dL   Alkaline Phosphatase 114 39 - 117 IU/L   AST 18 0 - 40 IU/L   ALT 50 (H) 0 - 44 IU/L  CBC     Status: Abnormal   Collection Time: 07/25/16 10:57 AM  Result Value Ref Range   WBC 7.4 3.4 - 10.8 x10E3/uL   RBC 3.93 (L) 4.14 - 5.80 x10E6/uL   Hemoglobin 11.7 (L) 13.0 - 17.7 g/dL   Hematocrit 36.1 (L) 37.5 - 51.0 %   MCV 92 79 - 97 fL   MCH 29.8 26.6 - 33.0 pg   MCHC 32.4 31.5 - 35.7 g/dL   RDW 14.4 12.3 - 15.4 %   Platelets 377 150 - 379 x10E3/uL     Assessment & Plan  1. Hospital discharge follow-up Doing well. Will re-check urine around October 24, 2016  2. Pneumonia of both lower lobes due to Klebsiella pneumoniae [facility]) Doing well,  continue to advance activity as tolerated  Return in about 1 month (around 09/01/2016) for chronic prob f/u.  As scheduled with Dr. Manuella Ghazi.  -Red flags and when to present for emergency care or RTC including fever >101.91F, chest pain, shortness of breath, new/worsening/un-resolving symptoms reviewed with patient at time of visit. Follow up and care instructions discussed and provided in AVS.  I have reviewed this encounter including the documentation in this note and/or discussed this patient with the Johney Maine, FNP, NP-C. I am certifying that I agree with the content of this note as supervising physician.  Steele Sizer, MD [facility] 08/01/2016, 3:47 PM

## 2016-08-02 ENCOUNTER — Other Ambulatory Visit: Payer: Self-pay | Admitting: Emergency Medicine

## 2016-08-02 ENCOUNTER — Telehealth: Payer: Self-pay | Admitting: Family Medicine

## 2016-08-02 ENCOUNTER — Ambulatory Visit: Payer: Medicare PPO | Admitting: Family Medicine

## 2016-08-02 DIAGNOSIS — F331 Major depressive disorder, recurrent, moderate: Secondary | ICD-10-CM

## 2016-08-02 MED ORDER — FLUOXETINE HCL 40 MG PO CAPS: 40.0000 mg | ORAL_CAPSULE | ORAL | 0 refills | Status: DC

## 2016-08-02 NOTE — Telephone Encounter (Signed)
Script sent  

## 2016-08-02 NOTE — Telephone Encounter (Signed)
Was seen this week and forgot to tell you that he is needing a refill on fluoxetine 40mg  capsule. Please send to [place]

## 2016-08-09 ENCOUNTER — Telehealth: Payer: Self-pay | Admitting: Family Medicine

## 2016-08-09 DIAGNOSIS — M170 Bilateral primary osteoarthritis of knee: Secondary | ICD-10-CM

## 2016-08-09 MED ORDER — HYDROCODONE-ACETAMINOPHEN 10-325 MG PO TABS: 1.0000 | ORAL_TABLET | Freq: Three times a day (TID) | ORAL | 0 refills | Status: DC | PRN

## 2016-08-09 NOTE — Telephone Encounter (Signed)
PT NEEDS REFILL ON HYDROCODONE 10-325.

## 2016-08-09 NOTE — Telephone Encounter (Signed)
Prescription for hydrocodone is printed and ready for pickup

## 2016-09-11 ENCOUNTER — Encounter: Payer: Self-pay | Admitting: Family Medicine

## 2016-09-11 ENCOUNTER — Ambulatory Visit (INDEPENDENT_AMBULATORY_CARE_PROVIDER_SITE_OTHER): Payer: Medicare PPO | Admitting: Family Medicine

## 2016-09-11 VITALS — BP 128/71 | HR 82 | Temp 98.2°F | Resp 17 | Ht 74.0 in | Wt 251.7 lb

## 2016-09-11 DIAGNOSIS — N400 Enlarged prostate without lower urinary tract symptoms: Secondary | ICD-10-CM

## 2016-09-11 DIAGNOSIS — E7800 Pure hypercholesterolemia, unspecified: Secondary | ICD-10-CM

## 2016-09-11 DIAGNOSIS — M170 Bilateral primary osteoarthritis of knee: Secondary | ICD-10-CM

## 2016-09-11 DIAGNOSIS — E119 Type 2 diabetes mellitus without complications: Secondary | ICD-10-CM

## 2016-09-11 DIAGNOSIS — I10 Essential (primary) hypertension: Secondary | ICD-10-CM | POA: Diagnosis not present

## 2016-09-11 DIAGNOSIS — M1A4720 Other secondary chronic gout, left ankle and foot, without tophus (tophi): Secondary | ICD-10-CM | POA: Diagnosis not present

## 2016-09-11 LAB — POCT GLYCOSYLATED HEMOGLOBIN (HGB A1C): HEMOGLOBIN A1C: 7.1

## 2016-09-11 MED ORDER — NABUMETONE 500 MG PO TABS: 500.0000 mg | ORAL_TABLET | Freq: Two times a day (BID) | ORAL | 0 refills | Status: DC

## 2016-09-11 MED ORDER — METFORMIN HCL ER 500 MG PO TB24: 1000.0000 mg | ORAL_TABLET | Freq: Two times a day (BID) | ORAL | 1 refills | Status: DC

## 2016-09-11 MED ORDER — LOSARTAN POTASSIUM 100 MG PO TABS: 100.0000 mg | ORAL_TABLET | Freq: Every day | ORAL | 0 refills | Status: DC

## 2016-09-11 MED ORDER — METOPROLOL SUCCINATE ER 100 MG PO TB24: 100.0000 mg | ORAL_TABLET | ORAL | 0 refills | Status: DC

## 2016-09-11 MED ORDER — ATORVASTATIN CALCIUM 40 MG PO TABS: 40.0000 mg | ORAL_TABLET | Freq: Every day | ORAL | 0 refills | Status: DC

## 2016-09-11 MED ORDER — ALLOPURINOL 300 MG PO TABS: 300.0000 mg | ORAL_TABLET | Freq: Every evening | ORAL | 0 refills | Status: DC

## 2016-09-11 MED ORDER — GLIPIZIDE 5 MG PO TABS: 5.0000 mg | ORAL_TABLET | Freq: Every day | ORAL | 0 refills | Status: DC

## 2016-09-11 MED ORDER — HYDROCODONE-ACETAMINOPHEN 10-325 MG PO TABS: 1.0000 | ORAL_TABLET | Freq: Three times a day (TID) | ORAL | 0 refills | Status: DC | PRN

## 2016-09-11 MED ORDER — TAMSULOSIN HCL 0.4 MG PO CAPS: 0.4000 mg | ORAL_CAPSULE | Freq: Every day | ORAL | 0 refills | Status: DC

## 2016-09-11 NOTE — Progress Notes (Signed)
Name: Evan Rivas   MRN: [id number]    DOB: 12/23/1950   Date:09/11/2016       Progress Note  Subjective  Chief Complaint  Chief Complaint  Patient presents with  . Follow-up    3 mo  . Medication Refill    Diabetes  He presents for his follow-up diabetic visit. He has type 2 diabetes mellitus. His disease course has been stable. Pertinent negatives for hypoglycemia include no pallor or sweats. Pertinent negatives for diabetes include no blurred vision, no foot paresthesias, no polydipsia and no polyuria. Pertinent negatives for diabetic complications include no CVA, heart disease or peripheral neuropathy. Current diabetic treatment includes oral agent (dual therapy). His weight is stable. He is following a diabetic and generally healthy diet. He participates in exercise daily. He monitors blood glucose at home 1-2 x per day. His breakfast blood glucose range is generally 140-180 mg/dl. An ACE inhibitor/angiotensin II receptor blocker is being taken.  Hypertension  This is a chronic problem. The problem is unchanged. The problem is controlled. Pertinent negatives include no blurred vision, orthopnea or sweats. Past treatments include calcium channel blockers, angiotensin blockers and beta blockers. There is no history of kidney disease, CAD/MI or CVA.  Hyperlipidemia  This is a chronic problem. The problem is controlled. Recent lipid tests were reviewed and are normal. Pertinent negatives include no leg pain. Current antihyperlipidemic treatment includes statins.  Arthritis  Presents for follow-up visit. He complains of pain and stiffness. The symptoms have been stable. Affected locations include the right knee and left knee (s/p left knee replacement).  Benign Prostatic Hypertrophy  This is a chronic problem. Obstructive symptoms do not include dribbling, a slower stream or straining. Pertinent negatives include no hematuria. Past treatments include tamsulosin.     Past Medical  History:  Diagnosis Date  . Anxiety    panic attacks  . Arthritis   . Cancer (HCC)    lip  . Depression   . Diabetes mellitus without complication ([place])    type 2  . Gout   . History of kidney stones   . Hyperlipidemia   . Hypertension   . Pneumonia    2014    Past Surgical History:  Procedure Laterality Date  . ARTHROSCOPIC REPAIR ACL Left   . CYSTOSCOPY W/ URETERAL STENT PLACEMENT Right 09/07/2014   Procedure: CYSTOSCOPY WITH STENT REPLACEMENT;  Surgeon: Hollice Espy, MD;  Location: [facility] ORS;  Service: Urology;  Laterality: Right;  . CYSTOSCOPY/URETEROSCOPY/HOLMIUM LASER/STENT PLACEMENT Right 08/17/2014   Procedure: CYSTOSCOPY/URETEROSCOPY//STENT PLACEMENT/ attempt of lithotripsy;  Surgeon: Hollice Espy, MD;  Location: [facility] OR[facility];  Service: Urology;  Laterality: Right;  . HERNIA REPAIR    . MOHS SURGERY     lip  . TOTAL KNEE ARTHROPLASTY Left 04/24/2016   Procedure: LEFT TOTAL KNEE ARTHROPLASTY;  Surgeon: Gaynelle Arabian, MD;  Location: [facility];  Service: Orthopedics;  Laterality: Left;  Adductor Block  . URETEROSCOPY WITH HOLMIUM LASER LITHOTRIPSY Right 09/07/2014   Procedure: URETEROSCOPY WITH HOLMIUM LASER LITHOTRIPSY;  Surgeon: Hollice Espy, MD;  Location: [facility];  Service: Urology;  Laterality: Right;  [name] VASECTOMY      Family History  Problem Relation Age of Onset  . Hypertension Mother   . Cancer Mother   . Stroke Father   . Depression Sister     Social History   Social History  . Marital status: Married    Spouse name: N/A  . Number of children: N/A  . Years of education: N/A  Occupational History  . Not on file.   Social History Main Topics  . Smoking status: Never Smoker  . Smokeless tobacco: Never Used  . Alcohol use No  . Drug use: No  . Sexual activity: Yes   Other Topics Concern  . Not on file   Social History Narrative  . No narrative on file     Current Outpatient Prescriptions:  .  allopurinol (ZYLOPRIM) 300 MG tablet, Take 1  tablet (300 mg total) by mouth every evening., Disp: 90 tablet, Rfl: 0 .  amLODipine (NORVASC) 10 MG tablet, Take 1 tablet (10 mg total) by mouth daily., Disp: 90 tablet, Rfl: 1 .  aspirin 81 MG tablet, Take 81 mg by mouth daily., Disp: , Rfl:  .  atorvastatin (LIPITOR) 40 MG tablet, Take 1 tablet (40 mg total) by mouth at bedtime., Disp: 90 tablet, Rfl: 0 .  clonazePAM (KLONOPIN) 1 MG tablet, Take 1 tablet (1 mg total) by mouth 2 (two) times daily as needed., Disp: 180 tablet, Rfl: 0 .  FLUoxetine (PROZAC) 40 MG capsule, Take 1 capsule (40 mg total) by mouth every morning., Disp: 90 capsule, Rfl: 0 .  fluticasone (FLONASE) 50 MCG/ACT nasal spray, Place 2 sprays into both nostrils daily. 2 sprays (Patient taking differently: Place 2 sprays into both nostrils daily as needed (for allergies/sinuses). ), Disp: 16 g, Rfl: 0 .  glipiZIDE (GLUCOTROL) 5 MG tablet, Take 1 tablet (5 mg total) by mouth daily., Disp: 90 tablet, Rfl: 0 .  HYDROcodone-acetaminophen (NORCO) 10-325 MG tablet, Take 1 tablet by mouth every 8 (eight) hours as needed., Disp: 90 tablet, Rfl: 0 .  losartan (COZAAR) 100 MG tablet, Take 1 tablet (100 mg total) by mouth at bedtime., Disp: 90 tablet, Rfl: 0 .  metFORMIN (GLUCOPHAGE-XR) 500 MG 24 hr tablet, Take 2 tablets (1,000 mg total) by mouth 2 (two) times daily. 1000mg  BID, Disp: 360 tablet, Rfl: 1 .  metoprolol succinate (TOPROL-XL) 100 MG 24 hr tablet, Take 1 tablet (100 mg total) by mouth every morning., Disp: 90 tablet, Rfl: 0 .  metoprolol succinate (TOPROL-XL) 25 MG 24 hr tablet, Take 1 tablet (25 mg total) by mouth every morning., Disp: 90 tablet, Rfl: 1 .  Multiple Vitamin (MULTIVITAMINS PO), Take by mouth., Disp: , Rfl:  .  nabumetone (RELAFEN) 500 MG tablet, Take 1 tablet (500 mg total) by mouth 2 (two) times daily., Disp: 180 tablet, Rfl: 0 .  tamsulosin (FLOMAX) 0.4 MG CAPS capsule, Take 1 capsule (0.4 mg total) by mouth daily after supper., Disp: 90 capsule, Rfl: 0 .   tiZANidine (ZANAFLEX) 4 MG tablet, Take 1 tablet (4 mg total) by mouth at bedtime., Disp: 90 tablet, Rfl: 0  Allergies  Allergen Reactions  . Celebrex  [Celecoxib]     GI  . Tetanus Toxoids Other (See Comments)    unkown  . Tetanus-Diphtheria Toxoids Td     Other reaction(s): Unknown     Review of Systems  Eyes: Negative for blurred vision.  Cardiovascular: Negative for orthopnea.  Genitourinary: Negative for hematuria.  Musculoskeletal: Positive for arthritis and stiffness.  Skin: Negative for pallor.  Endo/Heme/Allergies: Negative for polydipsia.     Objective  Vitals:   09/11/16 0846  BP: 128/71  Pulse: 82  Resp: 17  Temp: 98.2 F (36.8 C)  TempSrc: Oral  SpO2: 94%  Weight: 251 lb 11.2 oz (114.2 kg)  Height: 6\' 2"  (1.88 m)    Physical Exam  Constitutional: He is oriented to person,  place, and time and well-developed, well-nourished, and in no distress.  HENT:  Head: Normocephalic and atraumatic.  Cardiovascular: Normal rate, regular rhythm and normal heart sounds.   No murmur heard. Pulmonary/Chest: Effort normal and breath sounds normal. He has no wheezes.  Abdominal: Soft. Bowel sounds are normal. There is no tenderness.  Musculoskeletal: He exhibits edema.       Left knee: He exhibits swelling and erythema. He exhibits normal range of motion.       Legs: Surgical scar on left knee  Neurological: He is alert and oriented to person, place, and time.  Psychiatric: Mood, memory, affect and judgment normal.  Nursing note and vitals reviewed.     Assessment & Plan  1. Type 2 diabetes mellitus without complication, without long-term current use of insulin (HCC) Point-of-care A1c is 7.1%, well-controlled diabetes, continue on glipizide and metformin - POCT HgB A1C - glipiZIDE (GLUCOTROL) 5 MG tablet; Take 1 tablet (5 mg total) by mouth daily.  Dispense: 90 tablet; Refill: 0 - metFORMIN (GLUCOPHAGE-XR) 500 MG 24 hr tablet; Take 2 tablets (1,000 mg total) by  mouth 2 (two) times daily. 1000mg  BID  Dispense: 360 tablet; Refill: 1  2. Primary osteoarthritis of both knees Now status post left total knee replacement, continue hydrocodone as needed and nabumetone - HYDROcodone-acetaminophen (NORCO) 10-325 MG tablet; Take 1 tablet by mouth every 8 (eight) hours as needed.  Dispense: 90 tablet; Refill: 0 - nabumetone (RELAFEN) 500 MG tablet; Take 1 tablet (500 mg total) by mouth 2 (two) times daily.  Dispense: 180 tablet; Refill: 0  3. 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  Dispense: 90 tablet; Refill: 0  4. Pure hypercholesterolemia FLP from March 2018 reviewed and is at goal, continue on statin - atorvastatin (LIPITOR) 40 MG tablet; Take 1 tablet (40 mg total) by mouth at bedtime.  Dispense: 90 tablet; Refill: 0  5. Essential hypertension BP stable on present antihypertensive therapy - losartan (COZAAR) 100 MG tablet; Take 1 tablet (100 mg total) by mouth at bedtime.  Dispense: 90 tablet; Refill: 0 - metoprolol succinate (TOPROL-XL) 100 MG 24 hr tablet; Take 1 tablet (100 mg total) by mouth every morning.  Dispense: 90 tablet; Refill: 0  6. BPH without urinary obstruction  - tamsulosin (FLOMAX) 0.4 MG CAPS capsule; Take 1 capsule (0.4 mg total) by mouth daily after supper.  Dispense: 90 capsule; Refill: 0   Kaidance Pantoja Asad A. [facility] 09/11/2016 9:04 AM

## 2016-09-29 ENCOUNTER — Ambulatory Visit: Payer: Self-pay | Admitting: Orthopedic Surgery

## 2016-09-29 NOTE — Progress Notes (Signed)
Please place orders in EPIC as patient has a pre-op appointment on 10/05/2016! Thank you!

## 2016-10-02 ENCOUNTER — Ambulatory Visit: Payer: Self-pay | Admitting: Orthopedic Surgery

## 2016-10-09 ENCOUNTER — Telehealth: Payer: Self-pay | Admitting: Family Medicine

## 2016-10-09 ENCOUNTER — Other Ambulatory Visit: Payer: Self-pay

## 2016-10-09 DIAGNOSIS — Z87448 Personal history of other diseases of urinary system: Secondary | ICD-10-CM

## 2016-10-09 DIAGNOSIS — M170 Bilateral primary osteoarthritis of knee: Secondary | ICD-10-CM

## 2016-10-09 DIAGNOSIS — R809 Proteinuria, unspecified: Secondary | ICD-10-CM

## 2016-10-09 NOTE — Telephone Encounter (Signed)
Please call patient to schedule nurse visit for UA around 10/24/2016. Need to recheck patient's UA around 10/24/2016 to look for persistent Hematuria/Proteinuria (This is 3 months after initial UA s/p hospital discharge) per recommendation of Dr. Juleen China [facility]).

## 2016-10-09 NOTE — Telephone Encounter (Signed)
Pt needs refill on Hydrocodone. Dr Janyth Pupa pt

## 2016-10-09 NOTE — Telephone Encounter (Signed)
I received a request for a controlled substance while patient's primary is out of the office I reviewed [place] web site There are three other prescribers besides Dr. Manuella Ghazi in the last six months for narcotics (oxycodone Jan, oxycodone Feb, hydrocodone May) He also has 1 mg clonazepam on his med list and [place] web site I am going to deny the request for hydrocodone; Dr. Manuella Ghazi will return on Wednesday He'll need to discuss this with his primary

## 2016-10-09 NOTE — Telephone Encounter (Signed)
We will look forward to the results. I will notify Dr. Manuella Ghazi, PCP.

## 2016-10-09 NOTE — Telephone Encounter (Signed)
Will not be able to come in, having knee surgery on the 23rd of July and pre-op on July 18.  If any negative results come back he will make sure you are notified

## 2016-10-12 MED ORDER — HYDROCODONE-ACETAMINOPHEN 10-325 MG PO TABS: 1.0000 | ORAL_TABLET | Freq: Three times a day (TID) | ORAL | 0 refills | Status: DC | PRN

## 2016-10-12 NOTE — Addendum Note (Signed)
Addended byManuella Ghazi, Jess Sulak ASAD A on: 10/12/2016 06:41 PM   Modules accepted: Orders

## 2016-10-12 NOTE — Telephone Encounter (Addendum)
Patient was started on oxycodone for pain relief in the immediate postop period after knee replacement, it was discontinued shortly thereafter. he has been taking hydrocodone for osteoarthritis, this seems to be working well for him.

## 2016-10-16 NOTE — Patient Instructions (Signed)
Evan Rivas  10/16/2016   Your procedure is scheduled on: 10/23/2016    Report to [facility] Main  Entrance   Follow signs to Short Stay on first floor at  0515 AM  Call this number if you have problems the morning of surgery  [phone]   Remember: ONLY 1 PERSON MAY GO WITH YOU TO SHORT STAY TO GET  READY MORNING OF [place].  Do not eat food or drink liquids :After Midnight.     Take these medicines the morning of surgery with A SIP OF WATER: Amlodipine ( NOrasc), Clonazepam if needed, Prozac, Flonase if needed , Metoprolol ( Toprol)                                You may not have any metal on your body including hair pins and              piercings  Do not wear jewelry,  lotions, powders or perfumes, deodorant                         Men may shave face and neck.   Do not bring valuables to the hospital. [place].  Contacts, dentures or bridgework may not be worn into surgery.  Leave suitcase in the car. After surgery it may be brought to your room.                   Please read over the following fact sheets you were given: _____________________________________________________________________             [facility] - Preparing for Surgery Before surgery, you can play an important role.  Because skin is not sterile, your skin needs to be as free of germs as possible.  You can reduce the number of germs on your skin by washing with CHG (chlorahexidine gluconate) soap before surgery.  CHG is an antiseptic cleaner which kills germs and bonds with the skin to continue killing germs even after washing. Please DO NOT use if you have an allergy to CHG or antibacterial soaps.  If your skin becomes reddened/irritated stop using the CHG and inform your nurse when you arrive at Short Stay. Do not shave (including legs and underarms) for at least 48 hours prior to the first CHG shower.  You may shave  your face/neck. Please follow these instructions carefully:  1.  Shower with CHG Soap the night before surgery and the  morning of Surgery.  2.  If you choose to wash your hair, wash your hair first as usual with your  normal  shampoo.  3.  After you shampoo, rinse your hair and body thoroughly to remove the  shampoo.                           4.  Use CHG as you would any other liquid soap.  You can apply chg directly  to the skin and wash                       Gently with a scrungie or clean washcloth.  5.  Apply the CHG  Soap to your body ONLY FROM THE NECK DOWN.   Do not use on face/ open                           Wound or open sores. Avoid contact with eyes, ears mouth and genitals (private parts).                       Wash face,  Genitals (private parts) with your normal soap.             6.  Wash thoroughly, paying special attention to the area where your surgery  will be performed.  7.  Thoroughly rinse your body with warm water from the neck down.  8.  DO NOT shower/wash with your normal soap after using and rinsing off  the CHG Soap.                9.  Pat yourself dry with a clean towel.            10.  Wear clean pajamas.            11.  Place clean sheets on your bed the night of your first shower and do not  sleep with pets. Day of Surgery : Do not apply any lotions/deodorants the morning of surgery.  Please wear clean clothes to the hospital/surgery center.  FAILURE TO FOLLOW THESE INSTRUCTIONS MAY RESULT IN THE CANCELLATION OF YOUR SURGERY PATIENT SIGNATURE_________________________________  NURSE SIGNATURE__________________________________  ________________________________________________________________________  WHAT IS A BLOOD TRANSFUSION? Blood Transfusion Information  A transfusion is the replacement of blood or some of its parts. Blood is made up of multiple cells which provide different functions.  Red blood cells carry oxygen and are used for blood loss  replacement.  White blood cells fight against infection.  Platelets control bleeding.  Plasma helps clot blood.  Other blood products are available for specialized needs, such as hemophilia or other clotting disorders. BEFORE THE TRANSFUSION  Who gives blood for transfusions?   Healthy volunteers who are fully evaluated to make sure their blood is safe. This is blood bank blood. Transfusion therapy is the safest it has ever been in the practice of medicine. Before blood is taken from a donor, a complete history is taken to make sure that person has no history of diseases nor engages in risky social behavior (examples are intravenous drug use or sexual activity with multiple partners). The donor's travel history is screened to minimize risk of transmitting infections, such as malaria. The donated blood is tested for signs of infectious diseases, such as HIV and hepatitis. The blood is then tested to be sure it is compatible with you in order to minimize the chance of a transfusion reaction. If you or a relative donates blood, this is often done in anticipation of surgery and is not appropriate for emergency situations. It takes many days to process the donated blood. RISKS AND COMPLICATIONS Although transfusion therapy is very safe and saves many lives, the main dangers of transfusion include:   Getting an infectious disease.  Developing a transfusion reaction. This is an allergic reaction to something in the blood you were given. Every precaution is taken to prevent this. The decision to have a blood transfusion has been considered carefully by your caregiver before blood is given. Blood is not given unless the benefits outweigh the risks. AFTER THE TRANSFUSION  Right after receiving a blood transfusion, you  will usually feel much better and more energetic. This is especially true if your red blood cells have gotten low (anemic). The transfusion raises the level of the red blood cells which  carry oxygen, and this usually causes an energy increase.  The nurse administering the transfusion will monitor you carefully for complications. HOME CARE INSTRUCTIONS  No special instructions are needed after a transfusion. You may find your energy is better. Speak with your caregiver about any limitations on activity for underlying diseases you may have. SEEK MEDICAL CARE IF:   Your condition is not improving after your transfusion.  You develop redness or irritation at the intravenous (IV) site. SEEK IMMEDIATE MEDICAL CARE IF:  Any of the following symptoms occur over the next 12 hours:  Shaking chills.  You have a temperature by mouth above 102 F (38.9 C), not controlled by medicine.  Chest, back, or muscle pain.  People around you feel you are not acting correctly or are confused.  Shortness of breath or difficulty breathing.  Dizziness and fainting.  You get a rash or develop hives.  You have a decrease in urine output.  Your urine turns a dark color or changes to pink, red, or brown. Any of the following symptoms occur over the next 10 days:  You have a temperature by mouth above 102 F (38.9 C), not controlled by medicine.  Shortness of breath.  Weakness after normal activity.  The white part of the eye turns yellow (jaundice).  You have a decrease in the amount of urine or are urinating less often.  Your urine turns a dark color or changes to pink, red, or brown. Document Released: 03/17/2000 Document Revised: 06/12/2011 Document Reviewed: 11/04/2007 ExitCare Patient Information 2014 [place].  _______________________________________________________________________  Incentive Spirometer  An incentive spirometer is a tool that can help keep your lungs clear and active. This tool measures how well you are filling your lungs with each breath. Taking long deep breaths may help reverse or decrease the chance of developing breathing (pulmonary) problems  (especially infection) following:  A long period of time when you are unable to move or be active. BEFORE THE PROCEDURE   If the spirometer includes an indicator to show your best effort, your nurse or respiratory therapist will set it to a desired goal.  If possible, sit up straight or lean slightly forward. Try not to slouch.  Hold the incentive spirometer in an upright position. INSTRUCTIONS FOR USE  1. Sit on the edge of your bed if possible, or sit up as far as you can in bed or on a chair. 2. Hold the incentive spirometer in an upright position. 3. Breathe out normally. 4. Place the mouthpiece in your mouth and seal your lips tightly around it. 5. Breathe in slowly and as deeply as possible, raising the piston or the ball toward the top of the column. 6. Hold your breath for 3-5 seconds or for as long as possible. Allow the piston or ball to fall to the bottom of the column. 7. Remove the mouthpiece from your mouth and breathe out normally. 8. Rest for a few seconds and repeat Steps 1 through 7 at least 10 times every 1-2 hours when you are awake. Take your time and take a few normal breaths between deep breaths. 9. The spirometer may include an indicator to show your best effort. Use the indicator as a goal to work toward during each repetition. 10. After each set of 10 deep breaths, practice  coughing to be sure your lungs are clear. If you have an incision (the cut made at the time of surgery), support your incision when coughing by placing a pillow or rolled up towels firmly against it. Once you are able to get out of bed, walk around indoors and cough well. You may stop using the incentive spirometer when instructed by your caregiver.  RISKS AND COMPLICATIONS  Take your time so you do not get dizzy or light-headed.  If you are in pain, you may need to take or ask for pain medication before doing incentive spirometry. It is harder to take a deep breath if you are having  pain. AFTER USE  Rest and breathe slowly and easily.  It can be helpful to keep track of a log of your progress. Your caregiver can provide you with a simple table to help with this. If you are using the spirometer at home, follow these instructions: [place] IF:   You are having difficultly using the spirometer.  You have trouble using the spirometer as often as instructed.  Your pain medication is not giving enough relief while using the spirometer.  You develop fever of 100.5 F (38.1 C) or higher. SEEK IMMEDIATE MEDICAL CARE IF:   You cough up bloody sputum that had not been present before.  You develop fever of 102 F (38.9 C) or greater.  You develop worsening pain at or near the incision site. MAKE SURE YOU:   Understand these instructions.  Will watch your condition.  Will get help right away if you are not doing well or get worse. Document Released: 07/31/2006 Document Revised: 06/12/2011 Document Reviewed: 10/01/2006 [facility] Patient Information 2014 [place], [place].   ________________________________________________________________________

## 2016-10-18 ENCOUNTER — Ambulatory Visit (HOSPITAL_COMMUNITY)
Admission: RE | Admit: 2016-10-18 | Discharge: 2016-10-18 | Disposition: A | Discharge status: Home / Self Care | Payer: Medicare PPO | Source: Ambulatory Visit | Attending: Anesthesiology | Admitting: Anesthesiology

## 2016-10-18 ENCOUNTER — Encounter (HOSPITAL_COMMUNITY): Payer: Self-pay

## 2016-10-18 ENCOUNTER — Encounter (HOSPITAL_COMMUNITY)
Admission: RE | Admit: 2016-10-18 | Discharge: 2016-10-18 | Disposition: A | Discharge status: Home / Self Care | Payer: Medicare PPO | Source: Ambulatory Visit | Attending: Orthopedic Surgery | Admitting: Orthopedic Surgery

## 2016-10-18 DIAGNOSIS — Z0183 Encounter for blood typing: Secondary | ICD-10-CM | POA: Insufficient documentation

## 2016-10-18 DIAGNOSIS — Z01818 Encounter for other preprocedural examination: Secondary | ICD-10-CM

## 2016-10-18 DIAGNOSIS — M1711 Unilateral primary osteoarthritis, right knee: Secondary | ICD-10-CM | POA: Diagnosis not present

## 2016-10-18 DIAGNOSIS — Z7982 Long term (current) use of aspirin: Secondary | ICD-10-CM | POA: Diagnosis not present

## 2016-10-18 DIAGNOSIS — Z7984 Long term (current) use of oral hypoglycemic drugs: Secondary | ICD-10-CM | POA: Insufficient documentation

## 2016-10-18 DIAGNOSIS — Z01812 Encounter for preprocedural laboratory examination: Secondary | ICD-10-CM | POA: Diagnosis not present

## 2016-10-18 DIAGNOSIS — I10 Essential (primary) hypertension: Secondary | ICD-10-CM | POA: Diagnosis not present

## 2016-10-18 DIAGNOSIS — Z79899 Other long term (current) drug therapy: Secondary | ICD-10-CM | POA: Diagnosis not present

## 2016-10-18 HISTORY — DX: Gastro-esophageal reflux disease without esophagitis: K21.9

## 2016-10-18 LAB — COMPREHENSIVE METABOLIC PANEL
ALBUMIN: 4.1 g/dL (ref 3.5–5.0)
ALK PHOS: 75 U/L (ref 38–126)
ALT: 15 U/L — ABNORMAL LOW (ref 17–63)
ANION GAP: 10 (ref 5–15)
AST: 23 U/L (ref 15–41)
BILIRUBIN TOTAL: 0.5 mg/dL (ref 0.3–1.2)
BUN: 23 mg/dL — ABNORMAL HIGH (ref 6–20)
CALCIUM: 9.5 mg/dL (ref 8.9–10.3)
CO2: 31 mmol/L (ref 22–32)
Chloride: 102 mmol/L (ref 101–111)
Creatinine, Ser: 0.99 mg/dL (ref 0.61–1.24)
Glucose, Bld: 264 mg/dL — ABNORMAL HIGH (ref 65–99)
POTASSIUM: 4 mmol/L (ref 3.5–5.1)
Sodium: 143 mmol/L (ref 135–145)
TOTAL PROTEIN: 7.4 g/dL (ref 6.5–8.1)

## 2016-10-18 LAB — CBC
HEMATOCRIT: 42.4 % (ref 39.0–52.0)
HEMOGLOBIN: 14.4 g/dL (ref 13.0–17.0)
MCH: 31.4 pg (ref 26.0–34.0)
MCHC: 34 g/dL (ref 30.0–36.0)
MCV: 92.4 fL (ref 78.0–100.0)
Platelets: 186 10*3/uL (ref 150–400)
RBC: 4.59 MIL/uL (ref 4.22–5.81)
RDW: 14.5 % (ref 11.5–15.5)
WBC: 7.3 10*3/uL (ref 4.0–10.5)

## 2016-10-18 LAB — APTT: APTT: 28 s (ref 24–36)

## 2016-10-18 LAB — PROTIME-INR
INR: 0.97
PROTHROMBIN TIME: 12.9 s (ref 11.4–15.2)

## 2016-10-18 LAB — SURGICAL PCR SCREEN
MRSA, PCR: NEGATIVE
Staphylococcus aureus: NEGATIVE

## 2016-10-18 LAB — GLUCOSE, CAPILLARY: Glucose-Capillary: 255 mg/dL — ABNORMAL HIGH (ref 65–99)

## 2016-10-18 NOTE — Progress Notes (Signed)
EKG-07/19/16-epic  HGBA1C-09/11/16-epic-7.1

## 2016-10-18 NOTE — Progress Notes (Signed)
.......  31-Aug-1950  patient's date of birth is  Your patient has screened at an elevated risk for obstructive sleep apnea using the STOP-Bang tool during a presurgical visit. A score of 5 or greater is an elevated risk.

## 2016-10-21 ENCOUNTER — Ambulatory Visit: Payer: Self-pay | Admitting: Orthopedic Surgery

## 2016-10-21 NOTE — H&P (Signed)
Evan Rivas DOB: 27-Sep-1950 Married / Language: Evan Rivas / Race: White Male Date of Admission:  10/23/2016 CC:  Right knee pain History of Present Illness The patient is a 66 year old male who comes in for a preoperative History and Physical. The patient is scheduled for a right total knee arthroplasty to be performed by Dr. Dione Rivas. Aluisio, MD at [facility] on 10-23-2016. The patient is a 66 year old male presenting for a post-operative visit. The patient states that he is doing well at this time. The pain is under excellent control at this time and describe their pain as mild. They are currently on Hydrocodone (that he was on prior to surgery) for their pain. The patient is currently doing outpatient physical therapy (1x a week along with going to the gym). The patient feels that they are progressing well at this time. The left knee is doing great at this time. He is really pleased with how that has gone. He said he is getting around a lot better. The right knee is the main thing holding him back. He has got known bone on bone arthritis in the right. It is really limiting what he can and cannot do. He feels he is getting around a lot better because of the left knee, but the right one is still holding him back. He has had injections in the past. He is at a stage now where he wants to get the right knee fixed as nothing has helped and his function is worsening. They have been treated conservatively in the past for the above stated problem and despite conservative measures, they continue to have progressive pain and severe functional limitations and dysfunction. They have failed non-operative management including home exercise, medications, and injections. It is felt that they would benefit from undergoing total joint replacement. Risks and benefits of the procedure have been discussed with the patient and they elect to proceed with surgery. There are no active contraindications to surgery  such as ongoing infection or rapidly progressive neurological disease.   Problem List/Past Medical Trochanteric bursitis of right hip (M70.61)  Status post total left knee replacement (T55.732)  Primary osteoarthritis of right knee (M17.11)  Anxiety Disorder  Depression  Diabetes Mellitus, Type II  Gout  High blood pressure  Kidney Stone  Osteoporosis  Pneumonia  Past History and Recent Bout spring 2018 Hypercholesterolemia  Skin Cancer  Basal Cell Degenerative Disc Disease    Allergies  Tetanus Toxoid Adsorbed *Toxoids**  CeleBREX *ANALGESICS - ANTI-INFLAMMATORY*   Family History Cancer  Mother. Cerebrovascular Accident  Father. Depression  Father. Drug / Alcohol Addiction  Father. Heart Disease  Father. Hypertension  Mother. Osteoarthritis  Mother. Osteoporosis  Mother.  Social History Children  1 Current work status  disabled Exercise  Exercises weekly; does other and gym / [employer] Former drinker  11/26/2015: In the past drank beer and hard liquor 5-7 times per week Living situation  live with spouse Marital status  married No history of drug/alcohol rehab  Not under pain contract  Number of flights of stairs before winded  less than 1 Tobacco / smoke exposure  11/26/2015: yes outdoors only Tobacco use  Never smoker. 11/26/2015  Medication History Aspirin (81MG  Tablet, Oral) Active. Tamsulosin HCl (0.4MG  Capsule, Oral) Active. Allopurinol (300MG  Tablet, Oral) Active. AmLODIPine Besylate (10MG  Tablet, Oral) Active. Aspirin (81MG  Tablet DR, Oral) Active. Atorvastatin Calcium (40MG  Tablet, Oral) Active. ClonazePAM (1MG  Tablet, Oral) Active. FLUoxetine HCl (40MG  Capsule, Oral) Active. Fluticasone Propionate (Nasal) (  50MCG/ACT Suspension, Nasal) Active. GlipiZIDE (5MG  Tablet, Oral) Active. Hydrocodone-Acetaminophen (10-325MG  Tablet, Oral) Active. Losartan Potassium (100MG  Tablet, Oral) Active. MetFORMIN HCl  (500MG  Tablet, Oral) Active. Metoprolol Succinate (100MG  Tablet ER, Oral) Active. Multivitamin Adult (Oral) Active. Nabumetone (500MG  Tablet, Oral) Active. Tamsulosin HCl (0.4MG  Capsule, Oral) Active. TiZANidine HCl (4MG  Tablet, Oral) Active.  Past Surgical History  Arthroscopy of Knee  Date: 1995. left Colon Polyp Removal - Colonoscopy  Inguinal Hernia Repair  Date: 1999. open: right Vasectomy     Review of Systems General Present- Fatigue. Not Present- Chills, Fever, Memory Loss, Night Sweats, Weight Gain and Weight Loss. Skin Present- Dryness. Not Present- Eczema, Hives, Itching, Lesions and Rash. HEENT Not Present- Dentures, Double Vision, Headache, Hearing Loss, Tinnitus and Visual Loss. Respiratory Present- Snoring. Not Present- Allergies, Chronic Cough, Coughing up blood, Shortness of breath at rest and Shortness of breath with exertion. Cardiovascular Present- Hypertension and Swelling of Extremities. Not Present- Chest Pain, Difficulty Breathing Lying Down, Murmur, Palpitations, Racing/skipping heartbeats and Swelling. Gastrointestinal Present- Constipation, Diarrhea and Heartburn. Not Present- Abdominal Pain, Bloody Stool, Difficulty Swallowing, Jaundice, Loss of appetitie, Nausea and Vomiting. Male Genitourinary Present- Urinary frequency and Weak urinary stream. Not Present- Blood in Urine, Discharge, Flank Pain, Incontinence, Painful Urination, Urgency, Urinary Retention and Urinating at Night. Musculoskeletal Present- Joint Pain, Joint Swelling and Swelling of Extremities. Not Present- Back Pain, Morning Stiffness, Muscle Pain, Muscle Weakness and Spasms. Neurological Not Present- Blackout spells, Difficulty with balance, Dizziness, Paralysis, Tremor and Weakness. Psychiatric Present- Anxiety and Depression. Not Present- Insomnia.  Vitals  Weight: 255 lb Height: 74in Weight was reported by patient. Height was reported by patient. Body Surface Area: 2.41 m  Body Mass Index: 32.74 kg/m  Pulse: 76 (Regular)  BP: 162/90 (Sitting, Left Arm, Standard)       Physical Exam General Mental Status -Alert, cooperative and good historian. General Appearance-pleasant, Not in acute distress. Orientation-Oriented X3. Build & Nutrition-Well nourished and Well developed.  Head and Neck Head-normocephalic, atraumatic . Neck Global Assessment - supple, no bruit auscultated on the right, no bruit auscultated on the left.  Eye Pupil - Bilateral-Regular and Round. Motion - Bilateral-EOMI.  Chest and Lung Exam Auscultation Breath sounds - clear at anterior chest wall and clear at posterior chest wall. Adventitious sounds - No Adventitious sounds.  Cardiovascular Auscultation Rhythm - Regular rate and rhythm. Heart Sounds - S1 WNL and S2 WNL. Murmurs & Other Heart Sounds - Auscultation of the heart reveals - No Murmurs.  Abdomen Palpation/Percussion Tenderness - Abdomen is non-tender to palpation. Rigidity (guarding) - Abdomen is soft. Auscultation Auscultation of the abdomen reveals - Bowel sounds normal.  Male Genitourinary Note: Not done, not pertinent to present illness   Musculoskeletal Note: On exam, he is in no distress. His left knee shows no swelling. His range is 0 to 125 with no tenderness or instability. Right knee varus deformity, range 5 to 120. Moderate crepitus on range of motion. Tender medial greater than lateral, no instability.  RADIOGRAPHS Radiographs on the right knee and previous radiographs taken last year, which show bone on bone arthritis, medial and patellofemoral.     Assessment & Plan Primary osteoarthritis of right knee (M17.11) Status post total left knee replacement (Z56.387)  Note:Surgical Plans: Right Total Knee Replacement  Disposition: Home, Start with HHPT  PCP: Dr. Manuella Ghazi  IV TXA  Anesthesia Issues: None  Patient was instructed on what medications to stop prior to  surgery.  Signed electronically by Joelene Millin, III PA-C

## 2016-10-23 ENCOUNTER — Inpatient Hospital Stay (HOSPITAL_COMMUNITY)
Admission: RE | Admit: 2016-10-23 | Discharge: 2016-10-25 | DRG: 470 | Disposition: A | Discharge status: Home Health | Payer: Medicare PPO | Source: Ambulatory Visit | Attending: Orthopedic Surgery | Admitting: Orthopedic Surgery

## 2016-10-23 ENCOUNTER — Encounter (HOSPITAL_COMMUNITY): Payer: Self-pay | Admitting: *Deleted

## 2016-10-23 ENCOUNTER — Encounter (HOSPITAL_COMMUNITY)
Admission: RE | Disposition: A | Discharge status: Home Health | Payer: Self-pay | Source: Ambulatory Visit | Attending: Orthopedic Surgery

## 2016-10-23 ENCOUNTER — Inpatient Hospital Stay (HOSPITAL_COMMUNITY): Payer: Medicare PPO | Admitting: Certified Registered Nurse Anesthetist

## 2016-10-23 DIAGNOSIS — Z818 Family history of other mental and behavioral disorders: Secondary | ICD-10-CM

## 2016-10-23 DIAGNOSIS — E119 Type 2 diabetes mellitus without complications: Secondary | ICD-10-CM | POA: Diagnosis present

## 2016-10-23 DIAGNOSIS — M1710 Unilateral primary osteoarthritis, unspecified knee: Secondary | ICD-10-CM | POA: Diagnosis present

## 2016-10-23 DIAGNOSIS — M109 Gout, unspecified: Secondary | ICD-10-CM | POA: Diagnosis not present

## 2016-10-23 DIAGNOSIS — F329 Major depressive disorder, single episode, unspecified: Secondary | ICD-10-CM | POA: Diagnosis not present

## 2016-10-23 DIAGNOSIS — E7800 Pure hypercholesterolemia, unspecified: Secondary | ICD-10-CM | POA: Diagnosis not present

## 2016-10-23 DIAGNOSIS — Z8262 Family history of osteoporosis: Secondary | ICD-10-CM | POA: Diagnosis not present

## 2016-10-23 DIAGNOSIS — Z8701 Personal history of pneumonia (recurrent): Secondary | ICD-10-CM | POA: Diagnosis not present

## 2016-10-23 DIAGNOSIS — Z7984 Long term (current) use of oral hypoglycemic drugs: Secondary | ICD-10-CM | POA: Diagnosis not present

## 2016-10-23 DIAGNOSIS — M7061 Trochanteric bursitis, right hip: Secondary | ICD-10-CM | POA: Diagnosis present

## 2016-10-23 DIAGNOSIS — Z887 Allergy status to serum and vaccine status: Secondary | ICD-10-CM | POA: Diagnosis not present

## 2016-10-23 DIAGNOSIS — K219 Gastro-esophageal reflux disease without esophagitis: Secondary | ICD-10-CM | POA: Diagnosis present

## 2016-10-23 DIAGNOSIS — Z86010 Personal history of colonic polyps: Secondary | ICD-10-CM

## 2016-10-23 DIAGNOSIS — Z823 Family history of stroke: Secondary | ICD-10-CM | POA: Diagnosis not present

## 2016-10-23 DIAGNOSIS — M1711 Unilateral primary osteoarthritis, right knee: Secondary | ICD-10-CM | POA: Diagnosis not present

## 2016-10-23 DIAGNOSIS — Z8249 Family history of ischemic heart disease and other diseases of the circulatory system: Secondary | ICD-10-CM | POA: Diagnosis not present

## 2016-10-23 DIAGNOSIS — I10 Essential (primary) hypertension: Secondary | ICD-10-CM | POA: Diagnosis present

## 2016-10-23 DIAGNOSIS — M25561 Pain in right knee: Secondary | ICD-10-CM | POA: Diagnosis present

## 2016-10-23 DIAGNOSIS — Z7982 Long term (current) use of aspirin: Secondary | ICD-10-CM | POA: Diagnosis not present

## 2016-10-23 DIAGNOSIS — Z87442 Personal history of urinary calculi: Secondary | ICD-10-CM | POA: Diagnosis not present

## 2016-10-23 DIAGNOSIS — Z96652 Presence of left artificial knee joint: Secondary | ICD-10-CM | POA: Diagnosis not present

## 2016-10-23 DIAGNOSIS — Z7951 Long term (current) use of inhaled steroids: Secondary | ICD-10-CM | POA: Diagnosis not present

## 2016-10-23 DIAGNOSIS — Z888 Allergy status to other drugs, medicaments and biological substances status: Secondary | ICD-10-CM

## 2016-10-23 DIAGNOSIS — M810 Age-related osteoporosis without current pathological fracture: Secondary | ICD-10-CM | POA: Diagnosis present

## 2016-10-23 DIAGNOSIS — Z809 Family history of malignant neoplasm, unspecified: Secondary | ICD-10-CM

## 2016-10-23 DIAGNOSIS — Z85828 Personal history of other malignant neoplasm of skin: Secondary | ICD-10-CM

## 2016-10-23 DIAGNOSIS — G8918 Other acute postprocedural pain: Secondary | ICD-10-CM | POA: Diagnosis not present

## 2016-10-23 HISTORY — PX: TOTAL KNEE ARTHROPLASTY: SHX125

## 2016-10-23 LAB — GLUCOSE, CAPILLARY
GLUCOSE-CAPILLARY: 164 mg/dL — AB (ref 65–99)
Glucose-Capillary: 150 mg/dL — ABNORMAL HIGH (ref 65–99)
Glucose-Capillary: 222 mg/dL — ABNORMAL HIGH (ref 65–99)
Glucose-Capillary: 300 mg/dL — ABNORMAL HIGH (ref 65–99)
Glucose-Capillary: 294 mg/dL — ABNORMAL HIGH (ref 65–99)

## 2016-10-23 LAB — TYPE AND SCREEN
ABO/RH(D): A POS
ANTIBODY SCREEN: NEGATIVE

## 2016-10-23 SURGERY — ARTHROPLASTY, KNEE, TOTAL
Anesthesia: Spinal | Site: Knee | Laterality: Right

## 2016-10-23 MED ORDER — FLEET ENEMA 7-19 GM/118ML RE ENEM: 1.0000 | ENEMA | Freq: Once | RECTAL | Status: DC | PRN

## 2016-10-23 MED ORDER — POLYETHYLENE GLYCOL 3350 17 G PO PACK: 17.0000 g | PACK | Freq: Every day | ORAL | Status: DC | PRN

## 2016-10-23 MED ORDER — SODIUM CHLORIDE 0.9 % IV SOLN
INTRAVENOUS | Status: DC
  Administered 2016-10-23 (×2): via INTRAVENOUS

## 2016-10-23 MED ORDER — ACETAMINOPHEN 650 MG RE SUPP: 650.0000 mg | Freq: Four times a day (QID) | RECTAL | Status: DC | PRN

## 2016-10-23 MED ORDER — LACTATED RINGERS IV SOLN
INTRAVENOUS | Status: DC
  Administered 2016-10-23 (×2): via INTRAVENOUS

## 2016-10-23 MED ORDER — MORPHINE SULFATE (PF) 4 MG/ML IV SOLN
1.0000 mg | INTRAVENOUS | Status: DC | PRN
  Administered 2016-10-23 (×3): 1 mg via INTRAVENOUS
  Filled 2016-10-23 (×3): qty 1

## 2016-10-23 MED ORDER — MIDAZOLAM HCL 2 MG/2ML IJ SOLN
INTRAMUSCULAR | Status: AC
  Filled 2016-10-23: qty 2

## 2016-10-23 MED ORDER — PROPOFOL 10 MG/ML IV BOLUS
INTRAVENOUS | Status: AC
  Filled 2016-10-23: qty 60

## 2016-10-23 MED ORDER — ACETAMINOPHEN 325 MG PO TABS: 650.0000 mg | ORAL_TABLET | Freq: Four times a day (QID) | ORAL | Status: DC | PRN

## 2016-10-23 MED ORDER — PROMETHAZINE HCL 25 MG/ML IJ SOLN: 6.2500 mg | INTRAMUSCULAR | Status: DC | PRN

## 2016-10-23 MED ORDER — SODIUM CHLORIDE 0.9 % IR SOLN
Status: DC | PRN
  Administered 2016-10-23: 1000 mL

## 2016-10-23 MED ORDER — METHOCARBAMOL 500 MG PO TABS
500.0000 mg | ORAL_TABLET | Freq: Four times a day (QID) | ORAL | Status: DC | PRN
  Administered 2016-10-23 – 2016-10-25 (×6): 500 mg via ORAL
  Filled 2016-10-23 (×6): qty 1

## 2016-10-23 MED ORDER — TIZANIDINE HCL 4 MG PO TABS
4.0000 mg | ORAL_TABLET | Freq: Every day | ORAL | Status: DC
  Administered 2016-10-25: 4 mg via ORAL
  Filled 2016-10-23 (×2): qty 1

## 2016-10-23 MED ORDER — ROPIVACAINE HCL 5 MG/ML IJ SOLN
INTRAMUSCULAR | Status: DC | PRN
  Administered 2016-10-23: 30 mL via PERINEURAL

## 2016-10-23 MED ORDER — BUPIVACAINE LIPOSOME 1.3 % IJ SUSP
20.0000 mL | Freq: Once | INTRAMUSCULAR | Status: DC
  Filled 2016-10-23: qty 20

## 2016-10-23 MED ORDER — METOCLOPRAMIDE HCL 5 MG/ML IJ SOLN: 5.0000 mg | Freq: Three times a day (TID) | INTRAMUSCULAR | Status: DC | PRN

## 2016-10-23 MED ORDER — METOPROLOL SUCCINATE ER 25 MG PO TB24
25.0000 mg | ORAL_TABLET | Freq: Every morning | ORAL | Status: DC
  Administered 2016-10-24 – 2016-10-25 (×2): 25 mg via ORAL
  Filled 2016-10-23 (×2): qty 1

## 2016-10-23 MED ORDER — DIPHENHYDRAMINE HCL 12.5 MG/5ML PO ELIX: 12.5000 mg | ORAL_SOLUTION | ORAL | Status: DC | PRN

## 2016-10-23 MED ORDER — ALLOPURINOL 300 MG PO TABS
300.0000 mg | ORAL_TABLET | Freq: Every evening | ORAL | Status: DC
  Administered 2016-10-23 – 2016-10-24 (×2): 300 mg via ORAL
  Filled 2016-10-23 (×2): qty 1

## 2016-10-23 MED ORDER — PHENOL 1.4 % MT LIQD
1.0000 | OROMUCOSAL | Status: DC | PRN
Start: 2016-10-23 — End: 2016-10-25
  Filled 2016-10-23: qty 177

## 2016-10-23 MED ORDER — CEFAZOLIN SODIUM-DEXTROSE 2-4 GM/100ML-% IV SOLN
INTRAVENOUS | Status: AC
  Filled 2016-10-23: qty 100

## 2016-10-23 MED ORDER — RIVAROXABAN 10 MG PO TABS
10.0000 mg | ORAL_TABLET | Freq: Every day | ORAL | Status: DC
  Administered 2016-10-24 – 2016-10-25 (×2): 10 mg via ORAL
  Filled 2016-10-23 (×2): qty 1

## 2016-10-23 MED ORDER — METHOCARBAMOL 1000 MG/10ML IJ SOLN
500.0000 mg | Freq: Four times a day (QID) | INTRAVENOUS | Status: DC | PRN
  Filled 2016-10-23: qty 5

## 2016-10-23 MED ORDER — MENTHOL 3 MG MT LOZG: 1.0000 | LOZENGE | OROMUCOSAL | Status: DC | PRN

## 2016-10-23 MED ORDER — LOSARTAN POTASSIUM 50 MG PO TABS
100.0000 mg | ORAL_TABLET | Freq: Every day | ORAL | Status: DC
  Administered 2016-10-23 – 2016-10-24 (×2): 100 mg via ORAL
  Filled 2016-10-23 (×2): qty 2

## 2016-10-23 MED ORDER — INSULIN ASPART 100 UNIT/ML ~~LOC~~ SOLN
0.0000 [IU] | Freq: Three times a day (TID) | SUBCUTANEOUS | Status: DC
  Administered 2016-10-23: 12:00:00 5 [IU] via SUBCUTANEOUS
  Administered 2016-10-23: 17:00:00 8 [IU] via SUBCUTANEOUS
  Administered 2016-10-24: 09:00:00 3 [IU] via SUBCUTANEOUS
  Administered 2016-10-24 – 2016-10-25 (×3): 5 [IU] via SUBCUTANEOUS

## 2016-10-23 MED ORDER — HYDROMORPHONE HCL-NACL 0.5-0.9 MG/ML-% IV SOSY: 0.2500 mg | PREFILLED_SYRINGE | INTRAVENOUS | Status: DC | PRN

## 2016-10-23 MED ORDER — DEXAMETHASONE SODIUM PHOSPHATE 10 MG/ML IJ SOLN
INTRAMUSCULAR | Status: AC
  Filled 2016-10-23: qty 1

## 2016-10-23 MED ORDER — DEXAMETHASONE SODIUM PHOSPHATE 10 MG/ML IJ SOLN
10.0000 mg | Freq: Once | INTRAMUSCULAR | Status: AC
  Administered 2016-10-23: 10 mg via INTRAVENOUS

## 2016-10-23 MED ORDER — ONDANSETRON HCL 4 MG PO TABS: 4.0000 mg | ORAL_TABLET | Freq: Four times a day (QID) | ORAL | Status: DC | PRN

## 2016-10-23 MED ORDER — GLIPIZIDE 5 MG PO TABS
5.0000 mg | ORAL_TABLET | Freq: Every day | ORAL | Status: DC
  Administered 2016-10-24 – 2016-10-25 (×2): 5 mg via ORAL
  Filled 2016-10-23 (×2): qty 1

## 2016-10-23 MED ORDER — BUPIVACAINE LIPOSOME 1.3 % IJ SUSP
INTRAMUSCULAR | Status: DC | PRN
  Administered 2016-10-23: 20 mL

## 2016-10-23 MED ORDER — PROPOFOL 10 MG/ML IV BOLUS
INTRAVENOUS | Status: AC
  Filled 2016-10-23: qty 20

## 2016-10-23 MED ORDER — ACETAMINOPHEN 500 MG PO TABS
1000.0000 mg | ORAL_TABLET | Freq: Four times a day (QID) | ORAL | Status: AC
  Administered 2016-10-23 (×3): 1000 mg via ORAL
  Filled 2016-10-23 (×4): qty 2

## 2016-10-23 MED ORDER — DEXAMETHASONE SODIUM PHOSPHATE 10 MG/ML IJ SOLN
10.0000 mg | Freq: Once | INTRAMUSCULAR | Status: AC
  Administered 2016-10-24: 10 mg via INTRAVENOUS
  Filled 2016-10-23: qty 1

## 2016-10-23 MED ORDER — AMLODIPINE BESYLATE 10 MG PO TABS
10.0000 mg | ORAL_TABLET | Freq: Every day | ORAL | Status: DC
  Administered 2016-10-24 – 2016-10-25 (×2): 10 mg via ORAL
  Filled 2016-10-23 (×2): qty 1

## 2016-10-23 MED ORDER — METOPROLOL SUCCINATE ER 50 MG PO TB24
100.0000 mg | ORAL_TABLET | Freq: Every morning | ORAL | Status: DC
  Administered 2016-10-24 – 2016-10-25 (×2): 100 mg via ORAL
  Filled 2016-10-23 (×2): qty 2

## 2016-10-23 MED ORDER — ATORVASTATIN CALCIUM 40 MG PO TABS
40.0000 mg | ORAL_TABLET | Freq: Every day | ORAL | Status: DC
  Administered 2016-10-23 – 2016-10-24 (×2): 40 mg via ORAL
  Filled 2016-10-23 (×2): qty 1

## 2016-10-23 MED ORDER — CHLORHEXIDINE GLUCONATE 4 % EX LIQD: 60.0000 mL | Freq: Once | CUTANEOUS | Status: DC

## 2016-10-23 MED ORDER — CEFAZOLIN SODIUM-DEXTROSE 2-4 GM/100ML-% IV SOLN
2.0000 g | Freq: Four times a day (QID) | INTRAVENOUS | Status: AC
  Administered 2016-10-23 (×2): 2 g via INTRAVENOUS
  Filled 2016-10-23 (×2): qty 100

## 2016-10-23 MED ORDER — FENTANYL CITRATE (PF) 100 MCG/2ML IJ SOLN
INTRAMUSCULAR | Status: DC | PRN
  Administered 2016-10-23: 100 ug via INTRAVENOUS

## 2016-10-23 MED ORDER — DOCUSATE SODIUM 100 MG PO CAPS
100.0000 mg | ORAL_CAPSULE | Freq: Two times a day (BID) | ORAL | Status: DC
  Administered 2016-10-23 – 2016-10-25 (×4): 100 mg via ORAL
  Filled 2016-10-23 (×4): qty 1

## 2016-10-23 MED ORDER — BUPIVACAINE HCL (PF) 0.25 % IJ SOLN
INTRAMUSCULAR | Status: AC
  Filled 2016-10-23: qty 30

## 2016-10-23 MED ORDER — TRAMADOL HCL 50 MG PO TABS: 50.0000 mg | ORAL_TABLET | Freq: Four times a day (QID) | ORAL | Status: DC | PRN

## 2016-10-23 MED ORDER — SODIUM CHLORIDE 0.9 % IJ SOLN
INTRAMUSCULAR | Status: AC
  Filled 2016-10-23: qty 50

## 2016-10-23 MED ORDER — SODIUM CHLORIDE 0.9 % IJ SOLN
INTRAMUSCULAR | Status: DC | PRN
  Administered 2016-10-23: 10 mL
  Administered 2016-10-23: 50 mL

## 2016-10-23 MED ORDER — TAMSULOSIN HCL 0.4 MG PO CAPS
0.4000 mg | ORAL_CAPSULE | Freq: Every day | ORAL | Status: DC
  Administered 2016-10-23 – 2016-10-24 (×2): 0.4 mg via ORAL
  Filled 2016-10-23 (×2): qty 1

## 2016-10-23 MED ORDER — FENTANYL CITRATE (PF) 100 MCG/2ML IJ SOLN
INTRAMUSCULAR | Status: AC
  Filled 2016-10-23: qty 2

## 2016-10-23 MED ORDER — ACETAMINOPHEN 10 MG/ML IV SOLN
INTRAVENOUS | Status: AC
  Filled 2016-10-23: qty 100

## 2016-10-23 MED ORDER — OXYCODONE HCL 5 MG PO TABS
5.0000 mg | ORAL_TABLET | ORAL | Status: DC | PRN
  Administered 2016-10-23 (×4): 10 mg via ORAL
  Administered 2016-10-24 – 2016-10-25 (×10): 15 mg via ORAL
  Filled 2016-10-23 (×3): qty 3
  Filled 2016-10-23 (×2): qty 2
  Filled 2016-10-23 (×5): qty 3
  Filled 2016-10-23: qty 2
  Filled 2016-10-23 (×2): qty 3
  Filled 2016-10-23: qty 2

## 2016-10-23 MED ORDER — ONDANSETRON HCL 4 MG/2ML IJ SOLN: 4.0000 mg | Freq: Four times a day (QID) | INTRAMUSCULAR | Status: DC | PRN

## 2016-10-23 MED ORDER — ONDANSETRON HCL 4 MG/2ML IJ SOLN
INTRAMUSCULAR | Status: DC | PRN
  Administered 2016-10-23: 4 mg via INTRAVENOUS

## 2016-10-23 MED ORDER — ACETAMINOPHEN 10 MG/ML IV SOLN
1000.0000 mg | Freq: Once | INTRAVENOUS | Status: AC
  Administered 2016-10-23: 1000 mg via INTRAVENOUS

## 2016-10-23 MED ORDER — TRANEXAMIC ACID 1000 MG/10ML IV SOLN
1000.0000 mg | INTRAVENOUS | Status: AC
  Administered 2016-10-23: 1000 mg via INTRAVENOUS
  Filled 2016-10-23: qty 10

## 2016-10-23 MED ORDER — GABAPENTIN 300 MG PO CAPS
ORAL_CAPSULE | ORAL | Status: AC
  Administered 2016-10-23: 300 mg via ORAL
  Filled 2016-10-23: qty 1

## 2016-10-23 MED ORDER — METOCLOPRAMIDE HCL 5 MG PO TABS: 5.0000 mg | ORAL_TABLET | Freq: Three times a day (TID) | ORAL | Status: DC | PRN

## 2016-10-23 MED ORDER — MIDAZOLAM HCL 5 MG/5ML IJ SOLN
INTRAMUSCULAR | Status: DC | PRN
  Administered 2016-10-23: 2 mg via INTRAVENOUS
  Administered 2016-10-23: 1 mg via INTRAVENOUS

## 2016-10-23 MED ORDER — BISACODYL 10 MG RE SUPP: 10.0000 mg | Freq: Every day | RECTAL | Status: DC | PRN

## 2016-10-23 MED ORDER — CLONAZEPAM 1 MG PO TABS: 1.0000 mg | ORAL_TABLET | Freq: Two times a day (BID) | ORAL | Status: DC | PRN

## 2016-10-23 MED ORDER — FLUOXETINE HCL 20 MG PO CAPS
40.0000 mg | ORAL_CAPSULE | Freq: Every morning | ORAL | Status: DC
  Administered 2016-10-24 – 2016-10-25 (×2): 40 mg via ORAL
  Filled 2016-10-23 (×2): qty 2

## 2016-10-23 MED ORDER — FLUTICASONE PROPIONATE 50 MCG/ACT NA SUSP
2.0000 | Freq: Every day | NASAL | Status: DC | PRN
  Filled 2016-10-23: qty 16

## 2016-10-23 MED ORDER — CEFAZOLIN SODIUM-DEXTROSE 2-4 GM/100ML-% IV SOLN
2.0000 g | INTRAVENOUS | Status: AC
  Administered 2016-10-23: 2 g via INTRAVENOUS

## 2016-10-23 MED ORDER — SODIUM CHLORIDE 0.9 % IJ SOLN
INTRAMUSCULAR | Status: AC
  Filled 2016-10-23: qty 10

## 2016-10-23 MED ORDER — PROPOFOL 10 MG/ML IV BOLUS
INTRAVENOUS | Status: DC | PRN
  Administered 2016-10-23: 30 mg via INTRAVENOUS

## 2016-10-23 MED ORDER — PROPOFOL 500 MG/50ML IV EMUL
INTRAVENOUS | Status: DC | PRN
  Administered 2016-10-23: 100 ug/kg/min via INTRAVENOUS

## 2016-10-23 MED ORDER — GABAPENTIN 300 MG PO CAPS
300.0000 mg | ORAL_CAPSULE | Freq: Once | ORAL | Status: AC
  Administered 2016-10-23: 300 mg via ORAL

## 2016-10-23 SURGICAL SUPPLY — 49 items
BAG DECANTER FOR FLEXI CONT (MISCELLANEOUS) ×3 IMPLANT
BAG ZIPLOCK 12X15 (MISCELLANEOUS) ×3 IMPLANT
BANDAGE ACE 6X5 VEL STRL LF (GAUZE/BANDAGES/DRESSINGS) ×3 IMPLANT
BLADE SAG 18X100X1.27 (BLADE) ×3 IMPLANT
BLADE SAW SGTL 11.0X1.19X90.0M (BLADE) ×3 IMPLANT
BOWL SMART MIX CTS (DISPOSABLE) ×3 IMPLANT
CAPT KNEE TOTAL 3 ATTUNE ×3 IMPLANT
CEMENT HV SMART SET (Cement) ×6 IMPLANT
CLOSURE WOUND 1/2 X4 (GAUZE/BANDAGES/DRESSINGS) ×1
COVER SURGICAL LIGHT HANDLE (MISCELLANEOUS) ×3 IMPLANT
CUFF TOURN SGL QUICK 34 (TOURNIQUET CUFF) ×2
CUFF TRNQT CYL 34X4X40X1 (TOURNIQUET CUFF) ×1 IMPLANT
DECANTER SPIKE VIAL GLASS SM (MISCELLANEOUS) ×3 IMPLANT
DRAPE U-SHAPE 47X51 STRL (DRAPES) ×3 IMPLANT
DRSG ADAPTIC 3X8 NADH LF (GAUZE/BANDAGES/DRESSINGS) ×3 IMPLANT
DRSG PAD ABDOMINAL 8X10 ST (GAUZE/BANDAGES/DRESSINGS) ×3 IMPLANT
DURAPREP 26ML APPLICATOR (WOUND CARE) ×3 IMPLANT
ELECT REM PT RETURN 15FT ADLT (MISCELLANEOUS) ×3 IMPLANT
EVACUATOR 1/8 PVC DRAIN (DRAIN) ×3 IMPLANT
GAUZE SPONGE 4X4 12PLY STRL (GAUZE/BANDAGES/DRESSINGS) ×3 IMPLANT
GLOVE BIO SURGEON STRL SZ7.5 (GLOVE) IMPLANT
GLOVE BIO SURGEON STRL SZ8 (GLOVE) ×3 IMPLANT
GLOVE BIOGEL PI IND STRL 6.5 (GLOVE) ×4 IMPLANT
GLOVE BIOGEL PI IND STRL 8 (GLOVE) ×1 IMPLANT
GLOVE BIOGEL PI INDICATOR 6.5 (GLOVE) ×8
GLOVE BIOGEL PI INDICATOR 8 (GLOVE) ×2
GLOVE SURG SS PI 6.5 STRL IVOR (GLOVE) ×9 IMPLANT
GOWN STRL REUS W/TWL LRG LVL3 (GOWN DISPOSABLE) ×6 IMPLANT
GOWN STRL REUS W/TWL XL LVL3 (GOWN DISPOSABLE) ×6 IMPLANT
HANDPIECE INTERPULSE COAX TIP (DISPOSABLE) ×2
IMMOBILIZER KNEE 20 (SOFTGOODS) ×3
IMMOBILIZER KNEE 20 THIGH 36 (SOFTGOODS) ×1 IMPLANT
MANIFOLD NEPTUNE II (INSTRUMENTS) ×3 IMPLANT
NS IRRIG 1000ML POUR BTL (IV SOLUTION) ×3 IMPLANT
PACK TOTAL KNEE CUSTOM (KITS) ×3 IMPLANT
PADDING CAST COTTON 6X4 STRL (CAST SUPPLIES) ×3 IMPLANT
POSITIONER SURGICAL ARM (MISCELLANEOUS) ×3 IMPLANT
SET HNDPC FAN SPRY TIP SCT (DISPOSABLE) ×1 IMPLANT
STRIP CLOSURE SKIN 1/2X4 (GAUZE/BANDAGES/DRESSINGS) ×2 IMPLANT
SUT MNCRL AB 4-0 PS2 18 (SUTURE) ×3 IMPLANT
SUT STRATAFIX 0 PDS 27 VIOLET (SUTURE) ×3
SUT VIC AB 2-0 CT1 27 (SUTURE) ×6
SUT VIC AB 2-0 CT1 TAPERPNT 27 (SUTURE) ×3 IMPLANT
SUTURE STRATFX 0 PDS 27 VIOLET (SUTURE) ×1 IMPLANT
SYR 30ML LL (SYRINGE) ×6 IMPLANT
TRAY FOLEY W/METER SILVER 16FR (SET/KITS/TRAYS/PACK) ×3 IMPLANT
WATER STERILE IRR 1000ML POUR (IV SOLUTION) ×6 IMPLANT
WRAP KNEE MAXI GEL POST OP (GAUZE/BANDAGES/DRESSINGS) ×3 IMPLANT
YANKAUER SUCT BULB TIP 10FT TU (MISCELLANEOUS) ×3 IMPLANT

## 2016-10-23 NOTE — Anesthesia Procedure Notes (Signed)
Procedure Name: MAC Date/Time: 10/23/2016 7:10 AM Performed by: Lissa Morales Pre-anesthesia Checklist: Patient identified, Emergency Drugs available, Suction available and Patient being monitored Patient Re-evaluated:Patient Re-evaluated prior to induction Oxygen Delivery Method: Simple face mask Preoxygenation: Pre-oxygenation with 100% oxygen Number of attempts: 1 Placement Confirmation: positive ETCO2 Dental Injury: Teeth and Oropharynx as per pre-operative assessment

## 2016-10-23 NOTE — Transfer of Care (Signed)
Immediate Anesthesia Transfer of Care Note  Patient: Evan Rivas  Procedure(s) Performed: Procedure(s): RIGHT TOTAL KNEE ARTHROPLASTY (Right)  Patient Location: PACU  Anesthesia Type:Spinal  Level of Consciousness: awake, alert , oriented and patient cooperative  Airway & Oxygen Therapy: Patient Spontanous Breathing and Patient connected to face mask oxygen  Post-op Assessment: Report given to RN and Post -op Vital signs reviewed and stable  Post vital signs: stable  Last Vitals:  Vitals:   10/23/16 0544  BP: (!) 163/89  Pulse: 77  Resp: 18  Temp: 36.9 C    Last Pain:  Vitals:   10/23/16 0544  TempSrc: Oral      Patients Stated Pain Goal: 3 (68/11/57 2620)  Complications: No apparent anesthesia complications

## 2016-10-23 NOTE — H&P (View-Only) (Signed)
[name] DOB: 1951/01/18 Married / Language: [name] / Race: White Male Date of Admission:  10/23/2016 CC:  Right knee pain History of Present Illness The patient is a 66 year old male who comes in for a preoperative History and Physical. The patient is scheduled for a right total knee arthroplasty to be performed by Dr. Dione Plover. Aluisio, MD at [facility] on 10-23-2016. The patient is a 66 year old male presenting for a post-operative visit. The patient states that he is doing well at this time. The pain is under excellent control at this time and describe their pain as mild. They are currently on Hydrocodone (that he was on prior to surgery) for their pain. The patient is currently doing outpatient physical therapy (1x a week along with going to the gym). The patient feels that they are progressing well at this time. The left knee is doing great at this time. He is really pleased with how that has gone. He said he is getting around a lot better. The right knee is the main thing holding him back. He has got known bone on bone arthritis in the right. It is really limiting what he can and cannot do. He feels he is getting around a lot better because of the left knee, but the right one is still holding him back. He has had injections in the past. He is at a stage now where he wants to get the right knee fixed as nothing has helped and his function is worsening. They have been treated conservatively in the past for the above stated problem and despite conservative measures, they continue to have progressive pain and severe functional limitations and dysfunction. They have failed non-operative management including home exercise, medications, and injections. It is felt that they would benefit from undergoing total joint replacement. Risks and benefits of the procedure have been discussed with the patient and they elect to proceed with surgery. There are no active contraindications to surgery  such as ongoing infection or rapidly progressive neurological disease.   Problem List/Past Medical Trochanteric bursitis of right hip (M70.61)  Status post total left knee replacement (V42.595)  Primary osteoarthritis of right knee (M17.11)  Anxiety Disorder  Depression  Diabetes Mellitus, Type II  Gout  High blood pressure  Kidney Stone  Osteoporosis  Pneumonia  Past History and Recent Bout spring 2018 Hypercholesterolemia  Skin Cancer  Basal Cell Degenerative Disc Disease    Allergies  Tetanus Toxoid Adsorbed *Toxoids**  CeleBREX *ANALGESICS - ANTI-INFLAMMATORY*   Family History Cancer  Mother. Cerebrovascular Accident  Father. Depression  Father. Drug / Alcohol Addiction  Father. Heart Disease  Father. Hypertension  Mother. Osteoarthritis  Mother. Osteoporosis  Mother.  Social History Children  1 Current work status  disabled Exercise  Exercises weekly; does other and gym / [employer] Former drinker  11/26/2015: In the past drank beer and hard liquor 5-7 times per week Living situation  live with spouse Marital status  married No history of drug/alcohol rehab  Not under pain contract  Number of flights of stairs before winded  less than 1 Tobacco / smoke exposure  11/26/2015: yes outdoors only Tobacco use  Never smoker. 11/26/2015  Medication History Aspirin (81MG  Tablet, Oral) Active. Tamsulosin HCl (0.4MG  Capsule, Oral) Active. Allopurinol (300MG  Tablet, Oral) Active. AmLODIPine Besylate (10MG  Tablet, Oral) Active. Aspirin (81MG  Tablet DR, Oral) Active. Atorvastatin Calcium (40MG  Tablet, Oral) Active. ClonazePAM (1MG  Tablet, Oral) Active. FLUoxetine HCl (40MG  Capsule, Oral) Active. Fluticasone Propionate (Nasal) (  50MCG/ACT Suspension, Nasal) Active. GlipiZIDE (5MG  Tablet, Oral) Active. Hydrocodone-Acetaminophen (10-325MG  Tablet, Oral) Active. Losartan Potassium (100MG  Tablet, Oral) Active. MetFORMIN HCl  (500MG  Tablet, Oral) Active. Metoprolol Succinate (100MG  Tablet ER, Oral) Active. Multivitamin Adult (Oral) Active. Nabumetone (500MG  Tablet, Oral) Active. Tamsulosin HCl (0.4MG  Capsule, Oral) Active. TiZANidine HCl (4MG  Tablet, Oral) Active.  Past Surgical History  Arthroscopy of Knee  Date: 1995. left Colon Polyp Removal - Colonoscopy  Inguinal Hernia Repair  Date: 1999. open: right Vasectomy     Review of Systems General Present- Fatigue. Not Present- Chills, Fever, Memory Loss, Night Sweats, Weight Gain and Weight Loss. Skin Present- Dryness. Not Present- Eczema, Hives, Itching, Lesions and Rash. HEENT Not Present- Dentures, Double Vision, Headache, Hearing Loss, Tinnitus and Visual Loss. Respiratory Present- Snoring. Not Present- Allergies, Chronic Cough, Coughing up blood, Shortness of breath at rest and Shortness of breath with exertion. Cardiovascular Present- Hypertension and Swelling of Extremities. Not Present- Chest Pain, Difficulty Breathing Lying Down, Murmur, Palpitations, Racing/skipping heartbeats and Swelling. Gastrointestinal Present- Constipation, Diarrhea and Heartburn. Not Present- Abdominal Pain, Bloody Stool, Difficulty Swallowing, Jaundice, Loss of appetitie, Nausea and Vomiting. Male Genitourinary Present- Urinary frequency and Weak urinary stream. Not Present- Blood in Urine, Discharge, Flank Pain, Incontinence, Painful Urination, Urgency, Urinary Retention and Urinating at Night. Musculoskeletal Present- Joint Pain, Joint Swelling and Swelling of Extremities. Not Present- Back Pain, Morning Stiffness, Muscle Pain, Muscle Weakness and Spasms. Neurological Not Present- Blackout spells, Difficulty with balance, Dizziness, Paralysis, Tremor and Weakness. Psychiatric Present- Anxiety and Depression. Not Present- Insomnia.  Vitals  Weight: 255 lb Height: 74in Weight was reported by patient. Height was reported by patient. Body Surface Area: 2.41 m  Body Mass Index: 32.74 kg/m  Pulse: 76 (Regular)  BP: 162/90 (Sitting, Left Arm, Standard)       Physical Exam General Mental Status -Alert, cooperative and good historian. General Appearance-pleasant, Not in acute distress. Orientation-Oriented X3. Build & Nutrition-Well nourished and Well developed.  Head and Neck Head-normocephalic, atraumatic . Neck Global Assessment - supple, no bruit auscultated on the right, no bruit auscultated on the left.  Eye Pupil - Bilateral-Regular and Round. Motion - Bilateral-EOMI.  Chest and Lung Exam Auscultation Breath sounds - clear at anterior chest wall and clear at posterior chest wall. Adventitious sounds - No Adventitious sounds.  Cardiovascular Auscultation Rhythm - Regular rate and rhythm. Heart Sounds - S1 WNL and S2 WNL. Murmurs & Other Heart Sounds - Auscultation of the heart reveals - No Murmurs.  Abdomen Palpation/Percussion Tenderness - Abdomen is non-tender to palpation. Rigidity (guarding) - Abdomen is soft. Auscultation Auscultation of the abdomen reveals - Bowel sounds normal.  Male Genitourinary Note: Not done, not pertinent to present illness   Musculoskeletal Note: On exam, he is in no distress. His left knee shows no swelling. His range is 0 to 125 with no tenderness or instability. Right knee varus deformity, range 5 to 120. Moderate crepitus on range of motion. Tender medial greater than lateral, no instability.  RADIOGRAPHS Radiographs on the right knee and previous radiographs taken last year, which show bone on bone arthritis, medial and patellofemoral.     Assessment & Plan Primary osteoarthritis of right knee (M17.11) Status post total left knee replacement (U54.270)  Note:Surgical Plans: Right Total Knee Replacement  Disposition: Home, Start with HHPT  PCP: Dr. Manuella Ghazi  IV TXA  Anesthesia Issues: None  Patient was instructed on what medications to stop prior to  surgery.  Signed electronically by Joelene Millin, III PA-C

## 2016-10-23 NOTE — Anesthesia Procedure Notes (Signed)
Anesthesia Procedure Image    

## 2016-10-23 NOTE — Anesthesia Procedure Notes (Signed)
Anesthesia Regional Block: Adductor canal block   Pre-Anesthetic Checklist: ,, timeout performed, Correct Patient, Correct Site, Correct Laterality, Correct Procedure, Correct Position, site marked, Risks and benefits discussed,  Surgical consent,  Pre-op evaluation,  At surgeon's request and post-op pain management  Laterality: Right  Prep: chloraprep       Needles:  Injection technique: Single-shot  Needle Type: Stimiplex     Needle Length: 9cm      Additional Needles:   Procedures: ultrasound guided,,,,,,,,  Narrative:  Start time: 10/23/2016 6:55 AM End time: 10/23/2016 7:02 AM Injection made incrementally with aspirations every 5 mL.  Performed by: Personally  Anesthesiologist: Samiel Peel  Additional Notes: Patient tolerated the procedure well without complications

## 2016-10-23 NOTE — Interval H&P Note (Signed)
History and Physical Interval Note:  10/23/2016 6:31 AM  Evan Rivas  has presented today for surgery, with the diagnosis of Osteoarthritis Right Knee  The various methods of treatment have been discussed with the patient and family. After consideration of risks, benefits and other options for treatment, the patient has consented to  Procedure(s): RIGHT TOTAL KNEE ARTHROPLASTY (Right) as a surgical intervention .  The patient's history has been reviewed, patient examined, no change in status, stable for surgery.  I have reviewed the patient's chart and labs.  Questions were answered to the patient's satisfaction.     Gearlean Alf

## 2016-10-23 NOTE — Anesthesia Postprocedure Evaluation (Signed)
Anesthesia Post Note  Patient: Evan Rivas  Procedure(s) Performed: Procedure(s) (LRB): RIGHT TOTAL KNEE ARTHROPLASTY (Right)     Patient location during evaluation: PACU Anesthesia Type: Spinal Level of consciousness: oriented and awake and alert Pain management: pain level controlled Vital Signs Assessment: post-procedure vital signs reviewed and stable Respiratory status: spontaneous breathing, respiratory function stable and patient connected to nasal cannula oxygen Cardiovascular status: blood pressure returned to baseline and stable Postop Assessment: no headache and no backache Anesthetic complications: no    Last Vitals:  Vitals:   10/23/16 0915 10/23/16 0930  BP: (!) 148/78 (!) 141/74  Pulse: 63 63  Resp: 14 16  Temp:  36.8 C    Last Pain:  Vitals:   10/23/16 0930  TempSrc:   PainSc: 0-No pain                 Hinley Brimage S

## 2016-10-23 NOTE — Discharge Instructions (Addendum)
° °Dr. Frank Aluisio °Total Joint Specialist °[facility] °[address] °[place], [place] [address] °[phone] ° °TOTAL KNEE REPLACEMENT POSTOPERATIVE DIRECTIONS ° °Knee Rehabilitation, Guidelines Following Surgery  °Results after knee surgery are often greatly improved when you follow the exercise, range of motion and muscle strengthening exercises prescribed by your doctor. Safety measures are also important to protect the knee from further injury. Any time any of these exercises cause you to have increased pain or swelling in your knee joint, decrease the amount until you are comfortable again and slowly increase them. If you have problems or questions, call your caregiver or physical therapist for advice.  ° °HOME CARE INSTRUCTIONS  °Remove items at home which could result in a fall. This includes throw rugs or furniture in walking pathways.  °· ICE to the affected knee every three hours for 30 minutes at a time and then as needed for pain and swelling.  Continue to use ice on the knee for pain and swelling from surgery. You may notice swelling that will progress down to the foot and ankle.  This is normal after surgery.  Elevate the leg when you are not up walking on it.   °· Continue to use the breathing machine which will help keep your temperature down.  It is common for your temperature to cycle up and down following surgery, especially at night when you are not up moving around and exerting yourself.  The breathing machine keeps your lungs expanded and your temperature down. °· Do not place pillow under knee, focus on keeping the knee straight while resting ° °DIET °You may resume your previous home diet once your are discharged from the hospital. ° °DRESSING / WOUND CARE / SHOWERING °You may shower 3 days after surgery, but keep the wounds dry during showering.  You may use an occlusive plastic wrap (Press'n Seal for example), NO SOAKING/SUBMERGING IN THE BATHTUB.  If the  bandage gets wet, change with a clean dry gauze.  If the incision gets wet, pat the wound dry with a clean towel. °You may start showering once you are discharged home but do not submerge the incision under water. Just pat the incision dry and apply a dry gauze dressing on daily. °Change the surgical dressing daily and reapply a dry dressing each time. ° °ACTIVITY °Walk with your walker as instructed. °Use walker as long as suggested by your caregivers. °Avoid periods of inactivity such as sitting longer than an hour when not asleep. This helps prevent blood clots.  °You may resume a sexual relationship in one month or when given the OK by your doctor.  °You may return to work once you are cleared by your doctor.  °Do not drive a car for 6 weeks or until released by you surgeon.  °Do not drive while taking narcotics. ° °WEIGHT BEARING °Weight bearing as tolerated with assist device (walker, cane, etc) as directed, use it as long as suggested by your surgeon or therapist, typically at least 4-6 weeks. ° °POSTOPERATIVE CONSTIPATION PROTOCOL °Constipation - defined medically as fewer than three stools per week and severe constipation as less than one stool per week. ° °One of the most common issues patients have following surgery is constipation.  Even if you have a regular bowel pattern at home, your normal regimen is likely to be disrupted due to multiple reasons following surgery.  Combination of anesthesia, postoperative narcotics, change in appetite and fluid intake all can affect your bowels.    In order to avoid complications following surgery, here are some recommendations in order to help you during your recovery period. ° °Colace (docusate) - Pick up an over-the-counter form of Colace or another stool softener and take twice a day as long as you are requiring postoperative pain medications.  Take with a full glass of water daily.  If you experience loose stools or diarrhea, hold the colace until you stool forms  back up.  If your symptoms do not get better within 1 week or if they get worse, check with your doctor. ° °Dulcolax (bisacodyl) - Pick up over-the-counter and take as directed by the product packaging as needed to assist with the movement of your bowels.  Take with a full glass of water.  Use this product as needed if not relieved by Colace only.  ° °MiraLax (polyethylene glycol) - Pick up over-the-counter to have on hand.  MiraLax is a solution that will increase the amount of water in your bowels to assist with bowel movements.  Take as directed and can mix with a glass of water, juice, soda, coffee, or tea.  Take if you go more than two days without a movement. °Do not use MiraLax more than once per day. Call your doctor if you are still constipated or irregular after using this medication for 7 days in a row. ° °If you continue to have problems with postoperative constipation, please contact the office for further assistance and recommendations.  If you experience "the worst abdominal pain ever" or develop nausea or vomiting, please contact the office immediatly for further recommendations for treatment. ° °ITCHING ° If you experience itching with your medications, try taking only a single pain pill, or even half a pain pill at a time.  You can also use Benadryl over the counter for itching or also to help with sleep.  ° °TED HOSE STOCKINGS °Wear the elastic stockings on both legs for three weeks following surgery during the day but you may remove then at night for sleeping. ° °MEDICATIONS °See your medication summary on the “After Visit Summary” that the nursing staff will review with you prior to discharge.  You may have some home medications which will be placed on hold until you complete the course of blood thinner medication.  It is important for you to complete the blood thinner medication as prescribed by your surgeon.  Continue your approved medications as instructed at time of  discharge. ° °PRECAUTIONS °If you experience chest pain or shortness of breath - call 911 immediately for transfer to the hospital emergency department.  °If you develop a fever greater that 101 F, purulent drainage from wound, increased redness or drainage from wound, foul odor from the wound/dressing, or calf pain - CONTACT YOUR SURGEON.   °                                                °FOLLOW-UP APPOINTMENTS °Make sure you keep all of your appointments after your operation with your surgeon and caregivers. You should call the office at the above phone number and make an appointment for approximately two weeks after the date of your surgery or on the date instructed by your surgeon outlined in the "After Visit Summary". ° ° °RANGE OF MOTION AND STRENGTHENING EXERCISES  °Rehabilitation of the knee is important following a knee injury or   an operation. After just a few days of immobilization, the muscles of the thigh which control the knee become weakened and shrink (atrophy). Knee exercises are designed to build up the tone and strength of the thigh muscles and to improve knee motion. Often times heat used for twenty to thirty minutes before working out will loosen up your tissues and help with improving the range of motion but do not use heat for the first two weeks following surgery. These exercises can be done on a training (exercise) mat, on the floor, on a table or on a bed. Use what ever works the best and is most comfortable for you Knee exercises include:  °Leg Lifts - While your knee is still immobilized in a splint or cast, you can do straight leg raises. Lift the leg to 60 degrees, hold for 3 sec, and slowly lower the leg. Repeat 10-20 times 2-3 times daily. Perform this exercise against resistance later as your knee gets better.  °Quad and Hamstring Sets - Tighten up the muscle on the front of the thigh (Quad) and hold for 5-10 sec. Repeat this 10-20 times hourly. Hamstring sets are done by pushing the  foot backward against an object and holding for 5-10 sec. Repeat as with quad sets.  °· Leg Slides: Lying on your back, slowly slide your foot toward your buttocks, bending your knee up off the floor (only go as far as is comfortable). Then slowly slide your foot back down until your leg is flat on the floor again. °· Angel Wings: Lying on your back spread your legs to the side as far apart as you can without causing discomfort.  °A rehabilitation program following serious knee injuries can speed recovery and prevent re-injury in the future due to weakened muscles. Contact your doctor or a physical therapist for more information on knee rehabilitation.  ° °IF YOU ARE TRANSFERRED TO A SKILLED REHAB FACILITY °If the patient is transferred to a skilled rehab facility following release from the hospital, a list of the current medications will be sent to the facility for the patient to continue.  When discharged from the skilled rehab facility, please have the facility set up the patient's Home Health Physical Therapy prior to being released. Also, the skilled facility will be responsible for providing the patient with their medications at time of release from the facility to include their pain medication, the muscle relaxants, and their blood thinner medication. If the patient is still at the rehab facility at time of the two week follow up appointment, the skilled rehab facility will also need to assist the patient in arranging follow up appointment in our office and any transportation needs. ° °MAKE SURE YOU:  °Understand these instructions.  °Get help right away if you are not doing well or get worse.  ° ° °Pick up stool softner and laxative for home use following surgery while on pain medications. °Do not submerge incision under water. °Please use good hand washing techniques while changing dressing each day. °May shower starting three days after surgery. °Please use a clean towel to pat the incision dry following  showers. °Continue to use ice for pain and swelling after surgery. °Do not use any lotions or creams on the incision until instructed by your surgeon. ° °Take Xarelto for two and a half more weeks following discharge from the hospital, then discontinue Xarelto. °Once the patient has completed the Xarelto, they may resume the 81 mg Aspirin. ° ° ° °Information on   my medicine - XARELTO® (Rivaroxaban) ° °Why was Xarelto® prescribed for you? °Xarelto® was prescribed for you to reduce the risk of blood clots forming after orthopedic surgery. The medical term for these abnormal blood clots is venous thromboembolism (VTE). ° °What do you need to know about xarelto® ? °Take your Xarelto® ONCE DAILY at the same time every day. °You may take it either with or without food. ° °If you have difficulty swallowing the tablet whole, you may crush it and mix in applesauce just prior to taking your dose. ° °Take Xarelto® exactly as prescribed by your doctor and DO NOT stop taking Xarelto® without talking to the doctor who prescribed the medication.  Stopping without other VTE prevention medication to take the place of Xarelto® may increase your risk of developing a clot. ° °After discharge, you should have regular check-up appointments with your healthcare provider that is prescribing your Xarelto®.   ° °What do you do if you miss a dose? °If you miss a dose, take it as soon as you remember on the same day then continue your regularly scheduled once daily regimen the next day. Do not take two doses of Xarelto® on the same day.  ° °Important Safety Information °A possible side effect of Xarelto® is bleeding. You should call your healthcare provider right away if you experience any of the following: °? Bleeding from an injury or your nose that does not stop. °? Unusual colored urine (red or dark brown) or unusual colored stools (red or black). °? Unusual bruising for unknown reasons. °? A serious fall or if you hit your head (even if  there is no bleeding). ° °Some medicines may interact with Xarelto® and might increase your risk of bleeding while on Xarelto®. To help avoid this, consult your healthcare provider or pharmacist prior to using any new prescription or non-prescription medications, including herbals, vitamins, non-steroidal anti-inflammatory drugs (NSAIDs) and supplements. ° °This website has more information on Xarelto®: www.xarelto.com. ° ° ° °

## 2016-10-23 NOTE — Anesthesia Preprocedure Evaluation (Signed)
Anesthesia Evaluation  Patient identified by MRN, date of birth, ID band Patient awake    Reviewed: Allergy & Precautions, NPO status , Patient's Chart, lab work & pertinent test results  Airway Mallampati: II  TM Distance: >3 FB Neck ROM: Full    Dental no notable dental hx.    Pulmonary neg pulmonary ROS,    Pulmonary exam normal breath sounds clear to auscultation       Cardiovascular hypertension, Normal cardiovascular exam Rhythm:Regular Rate:Normal     Neuro/Psych negative neurological ROS  negative psych ROS   GI/Hepatic Neg liver ROS, GERD  ,  Endo/Other  diabetes  Renal/GU negative Renal ROS  negative genitourinary   Musculoskeletal negative musculoskeletal ROS (+)   Abdominal   Peds negative pediatric ROS (+)  Hematology negative hematology ROS (+)   Anesthesia Other Findings   Reproductive/Obstetrics negative OB ROS                             Anesthesia Physical Anesthesia Plan  ASA: II  Anesthesia Plan: Spinal   Post-op Pain Management:    Induction: Intravenous  PONV Risk Score and Plan: 1 and Ondansetron and Dexamethasone  Airway Management Planned: Simple Face Mask  Additional Equipment:   Intra-op Plan:   Post-operative Plan:   Informed Consent: I have reviewed the patients History and Physical, chart, labs and discussed the procedure including the risks, benefits and alternatives for the proposed anesthesia with the patient or authorized representative who has indicated his/her understanding and acceptance.   Dental advisory given  Plan Discussed with: CRNA and Surgeon  Anesthesia Plan Comments:         Anesthesia Quick Evaluation

## 2016-10-23 NOTE — Op Note (Signed)
OPERATIVE REPORT-TOTAL KNEE ARTHROPLASTY   Pre-operative diagnosis- Osteoarthritis  Right knee(s)  Post-operative diagnosis- Osteoarthritis Right knee(s)  Procedure-  Right  Total Knee Arthroplasty  Surgeon- Dione Plover. Leena Tiede, MD  Assistant- Ardeen Jourdain, PA-C   Anesthesia-  Adductor canal block and spinal  EBL-* No blood loss amount entered *   Drains Hemovac  Tourniquet time-  Total Tourniquet Time Documented: Thigh (Right) - 34 minutes Total: Thigh (Right) - 34 minutes     Complications- None  Condition-PACU - hemodynamically stable.   Brief Clinical Note  Evan Rivas is a 66 y.o. year old male with end stage OA of his right knee with progressively worsening pain and dysfunction. He has constant pain, with activity and at rest and significant functional deficits with difficulties even with ADLs. He has had extensive non-op management including analgesics, injections of cortisone, and home exercise program, but remains in significant pain with significant dysfunction. Radiographs show bone on bone arthritis medial and patellofemoral. He presents now for right Total Knee Arthroplasty.    Procedure in detail---   The patient is brought into the operating room and positioned supine on the operating table. After successful administration of  Adductor canal block and spinal,   a tourniquet is placed high on the  Right thigh(s) and the lower extremity is prepped and draped in the usual sterile fashion. Time out is performed by the operating team and then the  Right lower extremity is wrapped in Esmarch, knee flexed and the tourniquet inflated to 300 mmHg.       A midline incision is made with a ten blade through the subcutaneous tissue to the level of the extensor mechanism. A fresh blade is used to make a medial parapatellar arthrotomy. Soft tissue over the proximal medial tibia is subperiosteally elevated to the joint line with a knife and into the semimembranosus bursa  with a Cobb elevator. Soft tissue over the proximal lateral tibia is elevated with attention being paid to avoiding the patellar tendon on the tibial tubercle. The patella is everted, knee flexed 90 degrees and the ACL and PCL are removed. Findings are bone on bone medial and patellofemoral with large global osteophytes.        The drill is used to create a starting hole in the distal femur and the canal is thoroughly irrigated with sterile saline to remove the fatty contents. The 5 degree Right  valgus alignment guide is placed into the femoral canal and the distal femoral cutting block is pinned to remove 9 mm off the distal femur. Resection is made with an oscillating saw.      The tibia is subluxed forward and the menisci are removed. The extramedullary alignment guide is placed referencing proximally at the medial aspect of the tibial tubercle and distally along the second metatarsal axis and tibial crest. The block is pinned to remove 50mm off the more deficient medial  side. Resection is made with an oscillating saw. Size 7is the most appropriate size for the tibia and the proximal tibia is prepared with the modular drill and keel punch for that size.      The femoral sizing guide is placed and size 7 is most appropriate. Rotation is marked off the epicondylar axis and confirmed by creating a rectangular flexion gap at 90 degrees. The size 7 cutting block is pinned in this rotation and the anterior, posterior and chamfer cuts are made with the oscillating saw. The intercondylar block is then placed and that cut  is made.      Trial size 7 tibial component, trial size 7 posterior stabilized femur and a 6  mm posterior stabilized rotating platform insert trial is placed. Full extension is achieved with excellent varus/valgus and anterior/posterior balance throughout full range of motion. The patella is everted and thickness measured to be 27  mm. Free hand resection is taken to 15 mm, a 41 template is placed,  lug holes are drilled, trial patella is placed, and it tracks normally. Osteophytes are removed off the posterior femur with the trial in place. All trials are removed and the cut bone surfaces prepared with pulsatile lavage. Cement is mixed and once ready for implantation, the size 7 tibial implant, size  7 posterior stabilized femoral component, and the size 41` patella are cemented in place and the patella is held with the clamp. The trial insert is placed and the knee held in full extension. The Exparel (20 ml mixed with 60 ml saline) is injected into the extensor mechanism, posterior capsule, medial and lateral gutters and subcutaneous tissues.  All extruded cement is removed and once the cement is hard the permanent 6 mm posterior stabilized rotating platform insert is placed into the tibial tray.      The wound is copiously irrigated with saline solution and the extensor mechanism closed over a hemovac drain with #1 V-loc suture. The tourniquet is released for a total tourniquet time of 33  minutes. Flexion against gravity is 140 degrees and the patella tracks normally. Subcutaneous tissue is closed with 2.0 vicryl and subcuticular with running 4.0 Monocryl. The incision is cleaned and dried and steri-strips and a bulky sterile dressing are applied. The limb is placed into a knee immobilizer and the patient is awakened and transported to recovery in stable condition.      Please note that a surgical assistant was a medical necessity for this procedure in order to perform it in a safe and expeditious manner. Surgical assistant was necessary to retract the ligaments and vital neurovascular structures to prevent injury to them and also necessary for proper positioning of the limb to allow for anatomic placement of the prosthesis.   Dione Plover Cailey Trigueros, MD    10/23/2016, 8:19 AM

## 2016-10-23 NOTE — Progress Notes (Signed)
Assisted Dr. Rose with right, ultrasound guided, adductor canal block. Side rails up, monitors on throughout procedure. See vital signs in flow sheet. Tolerated Procedure well.  

## 2016-10-23 NOTE — Evaluation (Signed)
Physical Therapy Evaluation Patient Details Name: Evan Rivas MRN: [id number] DOB: [birth year] Today's Date: 10/23/2016   History of Present Illness  66 yo male s/p R TKA 10/23/16. Hx of L TKA 04/2016, DM, syncope post-op last surgery, gout.   Clinical Impression  On eval, pt required Min assist for mobility. He walked ~50 feet with a RW. Pain rated 5/10 with activity. Will progress activity as tolerated.     Follow Up Recommendations DC plan and follow up therapy as arranged by surgeon [facility])    Equipment Recommendations  None recommended by PT    Recommendations for Other Services       Precautions / Restrictions Precautions Precautions: Fall;Knee Required Braces or Orthoses: Knee Immobilizer - Right Restrictions Weight Bearing Restrictions: No RLE Weight Bearing: Weight bearing as tolerated      Mobility  Bed Mobility Overal bed mobility: Needs Assistance Bed Mobility: Supine to Sit     Supine to sit: Min assist;HOB elevated     General bed mobility comments: Assist for R LE.   Transfers Overall transfer level: Needs assistance Equipment used: Rolling walker (2 wheeled) Transfers: Sit to/from Stand Sit to Stand: Min assist;From elevated surface         General transfer comment: VCs safety, technique, hand/LE placement. Assist to rise, stabilize, control descent.   Ambulation/Gait Ambulation/Gait assistance: Min guard Ambulation Distance (Feet): 50 Feet Assistive device: Rolling walker (2 wheeled) Gait Pattern/deviations: Step-to pattern;Step-through pattern;Decreased stride length     General Gait Details: close guard for safety. VCs safety, sequence. Followed with recliner for safety. Pt fatigues fairly easily. He denied lightheadedness.   Stairs            Wheelchair Mobility    Modified Rankin (Stroke Patients Only)       Balance                                             Pertinent Vitals/Pain Pain  Assessment: 0-10 Pain Location: R knee  Pain Descriptors / Indicators: Aching;Sore Pain Intervention(s): Monitored during session;Repositioned;Ice applied    Home Living Family/patient expects to be discharged to:: Private residence Living Arrangements: Spouse/significant other Available Help at Discharge: Family Type of Home: House Home Access: Stairs to enter   Technical brewer of Steps: threshold Home Layout: One level Home Equipment: Environmental consultant - 2 wheels      Prior Function Level of Independence: Independent               Hand Dominance        Extremity/Trunk Assessment   Upper Extremity Assessment Upper Extremity Assessment: Defer to OT evaluation    Lower Extremity Assessment Lower Extremity Assessment: Generalized weakness (s/p R TKA)    Cervical / Trunk Assessment Cervical / Trunk Assessment: Normal  Communication   Communication: No difficulties  Cognition Arousal/Alertness: Awake/alert Behavior During Therapy: WFL for tasks assessed/performed Overall Cognitive Status: Within Functional Limits for tasks assessed                                        General Comments      Exercises     Assessment/Plan    PT Assessment Patient needs continued PT services  PT Problem List Decreased strength;Decreased mobility;Decreased range of motion;Decreased activity tolerance;Decreased balance;Decreased knowledge  of use of DME;Pain;Decreased knowledge of precautions       PT Treatment Interventions DME instruction;Therapeutic activities;Gait training;Therapeutic exercise;Patient/family education;Functional mobility training;Balance training    PT Goals (Current goals can be found in the Care Plan section)  Acute Rehab PT Goals Patient Stated Goal: regain PLOF PT Goal Formulation: With patient/family Time For Goal Achievement: 11/06/16 Potential to Achieve Goals: Good    Frequency 7X/week   Barriers to discharge         Co-evaluation               AM-PAC PT "6 Clicks" Daily Activity  Outcome Measure Difficulty turning over in bed (including adjusting bedclothes, sheets and blankets)?: A Little Difficulty moving from lying on back to sitting on the side of the bed? : A Little Difficulty sitting down on and standing up from a chair with arms (e.g., wheelchair, bedside commode, etc,.)?: Total Help needed moving to and from a bed to chair (including a wheelchair)?: A Little Help needed walking in hospital room?: A Little Help needed climbing 3-5 steps with a railing? : A Little 6 Click Score: 16    End of Session Equipment Utilized During Treatment: Right knee immobilizer;Gait belt Activity Tolerance: Patient limited by fatigue Patient left: in chair;with call bell/phone within reach;with family/visitor present   PT Visit Diagnosis: Muscle weakness (generalized) (M62.81);Difficulty in walking, not elsewhere classified (R26.2)    Time: 6546-5035 PT Time Calculation (min) (ACUTE ONLY): 12 min   Charges:   PT Evaluation $PT Eval Low Complexity: 1 Procedure     PT G Codes:          Weston Anna, MPT Pager: [phone]

## 2016-10-24 LAB — BASIC METABOLIC PANEL
ANION GAP: 10 (ref 5–15)
BUN: 17 mg/dL (ref 6–20)
CALCIUM: 8.4 mg/dL — AB (ref 8.9–10.3)
CO2: 25 mmol/L (ref 22–32)
Chloride: 103 mmol/L (ref 101–111)
Creatinine, Ser: 0.98 mg/dL (ref 0.61–1.24)
Glucose, Bld: 228 mg/dL — ABNORMAL HIGH (ref 65–99)
Potassium: 3.4 mmol/L — ABNORMAL LOW (ref 3.5–5.1)
Sodium: 138 mmol/L (ref 135–145)

## 2016-10-24 LAB — CBC
HCT: 38.3 % — ABNORMAL LOW (ref 39.0–52.0)
HEMOGLOBIN: 13.7 g/dL (ref 13.0–17.0)
MCH: 32 pg (ref 26.0–34.0)
MCHC: 35.8 g/dL (ref 30.0–36.0)
MCV: 89.5 fL (ref 78.0–100.0)
Platelets: 199 10*3/uL (ref 150–400)
RBC: 4.28 MIL/uL (ref 4.22–5.81)
RDW: 14.1 % (ref 11.5–15.5)
WBC: 11.7 10*3/uL — ABNORMAL HIGH (ref 4.0–10.5)

## 2016-10-24 LAB — GLUCOSE, CAPILLARY
GLUCOSE-CAPILLARY: 199 mg/dL — AB (ref 65–99)
Glucose-Capillary: 239 mg/dL — ABNORMAL HIGH (ref 65–99)
Glucose-Capillary: 233 mg/dL — ABNORMAL HIGH (ref 65–99)
Glucose-Capillary: 227 mg/dL — ABNORMAL HIGH (ref 65–99)

## 2016-10-24 MED ORDER — RIVAROXABAN 10 MG PO TABS: 10.0000 mg | ORAL_TABLET | Freq: Every day | ORAL | 0 refills | Status: DC

## 2016-10-24 MED ORDER — OXYCODONE HCL 5 MG PO TABS: 5.0000 mg | ORAL_TABLET | ORAL | 0 refills | Status: DC | PRN

## 2016-10-24 MED ORDER — TRAMADOL HCL 50 MG PO TABS: 50.0000 mg | ORAL_TABLET | Freq: Four times a day (QID) | ORAL | 0 refills | Status: DC | PRN

## 2016-10-24 MED ORDER — METHOCARBAMOL 500 MG PO TABS: 500.0000 mg | ORAL_TABLET | Freq: Four times a day (QID) | ORAL | 0 refills | Status: DC | PRN

## 2016-10-24 NOTE — Progress Notes (Signed)
   Subjective: 1 Day Post-Op Procedure(s) (LRB): RIGHT TOTAL KNEE ARTHROPLASTY (Right) Patient reports pain as mild and moderate.   Patient seen in rounds for Dr. Wynelle Link. Patient is well, but has had some minor complaints of pain in the knee, requiring pain medications We will resume therapy today.  Walked 50 DOS. Plan is to go Home after hospital stay.  Objective: Vital signs in last 24 hours: Temp:  [97.8 F (36.6 C)-98.7 F (37.1 C)] 98.4 F (36.9 C) (07/24 0550) Pulse Rate:  [63-85] 70 (07/24 0550) Resp:  [14-161] 16 (07/24 0550) BP: (141-175)/(71-81) 175/81 (07/24 0550) SpO2:  [95 %-100 %] 98 % (07/24 0550)  Intake/Output from previous day:  Intake/Output Summary (Last 24 hours) at 10/24/16 0907 Last data filed at 10/24/16 0640  Gross per 24 hour  Intake             2570 ml  Output             4635 ml  Net            -2065 ml    Intake/Output this shift: No intake/output data recorded.  Labs:  Recent Labs  10/24/16 0717  HGB 13.7    Recent Labs  10/24/16 0717  WBC 11.7*  RBC 4.28  HCT 38.3*  PLT 199    Recent Labs  10/24/16 0525  NA 138  K 3.4*  CL 103  CO2 25  BUN 17  CREATININE 0.98  GLUCOSE 228*  CALCIUM 8.4*   No results for input(s): LABPT, INR in the last 72 hours.  EXAM General - Patient is Alert, Appropriate and Oriented Extremity - Neurovascular intact Sensation intact distally Intact pulses distally Dorsiflexion/Plantar flexion intact Dressing - dressing C/D/I Motor Function - intact, moving foot and toes well on exam.  Hemovac pulled without difficulty.  Past Medical History:  Diagnosis Date  . Anxiety    panic attacks  . Arthritis   . Cancer (HCC)    lip  . Depression   . Diabetes mellitus without complication ([place])    type 2  . GERD (gastroesophageal reflux disease)   . Gout   . History of kidney stones   . Hyperlipidemia   . Hypertension   . Pneumonia    2014 and 2018 ( 2018-klebsiella pneumonia      Assessment/Plan: 1 Day Post-Op Procedure(s) (LRB): RIGHT TOTAL KNEE ARTHROPLASTY (Right) Active Problems:   OA (osteoarthritis) of knee  Estimated body mass index is 32.74 kg/m as calculated from the following:   Height as of this encounter: 6\' 2"  (1.88 m).   Weight as of this encounter: 115.7 kg (255 lb). Advance diet Up with therapy Plan for discharge tomorrow Discharge home with home health    DVT Prophylaxis - Xarelto Weight-Bearing as tolerated to right leg D/C O2 and Pulse OX and try on Room Air  Arlee Muslim, PA-C Orthopaedic Surgery 10/24/2016, 9:07 AM

## 2016-10-24 NOTE — Progress Notes (Signed)
Physical Therapy Treatment Patient Details Name: Evan Rivas MRN: [id number] DOB: Jul 13, 1950 Today's Date: 10/24/2016    History of Present Illness 66 yo male s/p R TKA 10/23/16. Hx of L TKA 04/2016, DM, syncope post-op last surgery, gout.     PT Comments    Progressing with mobility. Increased pain today compared to yesterday.    Follow Up Recommendations  DC plan and follow up therapy as arranged by surgeon (home health)     Equipment Recommendations  None recommended by PT    Recommendations for Other Services       Precautions / Restrictions Precautions Precautions: Fall;Knee Required Braces or Orthoses: Knee Immobilizer - Right Knee Immobilizer - Right: Discontinue once straight leg raise with < 10 degree lag Restrictions Weight Bearing Restrictions: No RLE Weight Bearing: Weight bearing as tolerated    Mobility  Bed Mobility Overal bed mobility: Needs Assistance Bed Mobility: Sit to Supine     Supine to sit: Min assist;HOB elevated Sit to supine: Min assist   General bed mobility comments: Assist for R LE.   Transfers Overall transfer level: Needs assistance Equipment used: Rolling walker (2 wheeled) Transfers: Sit to/from Stand Sit to Stand: Min guard         General transfer comment: close guard for safety. VCs safety, hand/LE placement  Ambulation/Gait Ambulation/Gait assistance: Min guard Ambulation Distance (Feet): 75 Feet Assistive device: Rolling walker (2 wheeled) Gait Pattern/deviations: Step-to pattern     General Gait Details: close guard for safety. VCs safety, sequence. Followed with recliner for safety. Pt fatigues fairly easily. He denied lightheadedness.    Stairs            Wheelchair Mobility    Modified Rankin (Stroke Patients Only)       Balance                                            Cognition Arousal/Alertness: Awake/alert Behavior During Therapy: WFL for tasks  assessed/performed Overall Cognitive Status: Within Functional Limits for tasks assessed                                        Exercises Total Joint Exercises Ankle Circles/Pumps: AROM;Both;10 reps;Supine Quad Sets: AROM;Both;10 reps;Supine Heel Slides: AAROM;Right;10 reps;Supine Hip ABduction/ADduction: AAROM;Right;10 reps;Supine Straight Leg Raises: AAROM;Right;10 reps;Supine Goniometric ROM: ~10-60 degrees    General Comments General comments (skin integrity, edema, etc.): one posterior LOB; see ADL comments above      Pertinent Vitals/Pain Pain Assessment: 0-10 Pain Score: 7  Pain Location: R knee  Pain Descriptors / Indicators: Aching;Sore Pain Intervention(s): Monitored during session;Repositioned;Ice applied    Home Living Family/patient expects to be discharged to:: Private residence Living Arrangements: Spouse/significant other Available Help at Discharge: Family         Home Equipment: Bedside commode      Prior Function Level of Independence: Independent          PT Goals (current goals can now be found in the care plan section) Acute Rehab PT Goals Patient Stated Goal: regain PLOF Progress towards PT goals: Progressing toward goals    Frequency    7X/week      PT Plan Current plan remains appropriate    Co-evaluation  AM-PAC PT "6 Clicks" Daily Activity  Outcome Measure  Difficulty turning over in bed (including adjusting bedclothes, sheets and blankets)?: A Little Difficulty moving from lying on back to sitting on the side of the bed? : Total Difficulty sitting down on and standing up from a chair with arms (e.g., wheelchair, bedside commode, etc,.)?: A Lot Help needed moving to and from a bed to chair (including a wheelchair)?: A Little Help needed walking in hospital room?: A Little Help needed climbing 3-5 steps with a railing? : A Lot 6 Click Score: 14    End of Session Equipment Utilized During  Treatment: Gait belt;Right knee immobilizer Activity Tolerance: Patient tolerated treatment well Patient left: in bed;with call bell/phone within reach;with family/visitor present   PT Visit Diagnosis: Muscle weakness (generalized) (M62.81);Difficulty in walking, not elsewhere classified (R26.2)     Time: 9678-9381 PT Time Calculation (min) (ACUTE ONLY): 13 min  Charges:  $Gait Training: 8-22 mins                    G Codes:          Weston Anna, MPT Pager: [phone]

## 2016-10-24 NOTE — Progress Notes (Signed)
Physical Therapy Treatment Patient Details Name: Evan Rivas MRN: [id number] DOB: 12-Aug-1950 Today's Date: 10/24/2016    History of Present Illness 66 yo male s/p R TKA 10/23/16. Hx of L TKA 04/2016, DM, syncope post-op last surgery, gout.     PT Comments    Progressing with mobility. Pt c/o increased pain on today. Will plan to practice stair negotiation prior to d/c home tomorrow.    Follow Up Recommendations  DC plan and follow up therapy as arranged by surgeon [facility])     Equipment Recommendations  None recommended by PT    Recommendations for Other Services       Precautions / Restrictions Precautions Precautions: Fall;Knee Required Braces or Orthoses: Knee Immobilizer - Right Knee Immobilizer - Right: Discontinue once straight leg raise with < 10 degree lag Restrictions Weight Bearing Restrictions: No RLE Weight Bearing: Weight bearing as tolerated    Mobility  Bed Mobility Overal bed mobility: Needs Assistance Bed Mobility: Supine to Sit;Sit to Supine     Supine to sit: Min assist;HOB elevated Sit to supine: Min assist;HOB elevated   General bed mobility comments: Assist for R LE.   Transfers Overall transfer level: Needs assistance Equipment used: Rolling walker (2 wheeled) Transfers: Sit to/from Stand Sit to Stand: Min assist         General transfer comment: VCs safety, hand/LE placement. Small amount of assist to rise.   Ambulation/Gait Ambulation/Gait assistance: Min guard Ambulation Distance (Feet): 75 Feet Assistive device: Rolling walker (2 wheeled) Gait Pattern/deviations: Step-to pattern     General Gait Details: close guard for safety.    Stairs            Wheelchair Mobility    Modified Rankin (Stroke Patients Only)       Balance                                            Cognition Arousal/Alertness: Awake/alert Behavior During Therapy: WFL for tasks assessed/performed Overall Cognitive Status:  Within Functional Limits for tasks assessed                                        Exercises    General Comments        Pertinent Vitals/Pain Pain Assessment: 0-10 Pain Score: 7  Pain Location: R knee/thigh Pain Descriptors / Indicators: Aching;Sore Pain Intervention(s): Monitored during session;Repositioned    Home Living                      Prior Function            PT Goals (current goals can now be found in the care plan section) Progress towards PT goals: Progressing toward goals    Frequency    7X/week      PT Plan Current plan remains appropriate    Co-evaluation              AM-PAC PT "6 Clicks" Daily Activity  Outcome Measure  Difficulty turning over in bed (including adjusting bedclothes, sheets and blankets)?: A Little Difficulty moving from lying on back to sitting on the side of the bed? : Total Difficulty sitting down on and standing up from a chair with arms (e.g., wheelchair, bedside commode, etc,.)?: Total Help needed moving to  and from a bed to chair (including a wheelchair)?: A Little Help needed walking in hospital room?: A Little Help needed climbing 3-5 steps with a railing? : A Lot 6 Click Score: 13    End of Session Equipment Utilized During Treatment: Gait belt;Right knee immobilizer Activity Tolerance: Patient tolerated treatment well Patient left: in bed;with call bell/phone within reach;with family/visitor present   PT Visit Diagnosis: Muscle weakness (generalized) (M62.81);Difficulty in walking, not elsewhere classified (R26.2)     Time: 5945-8592 PT Time Calculation (min) (ACUTE ONLY): 22 min  Charges:  $Gait Training: 8-22 mins                    G Codes:          Weston Anna, MPT Pager: [phone]

## 2016-10-24 NOTE — Evaluation (Signed)
Occupational Therapy Evaluation Patient Details Name: Evan Rivas MRN: [id number] DOB: 1951/04/03 Today's Date: 10/24/2016    History of Present Illness 66 yo male s/p R TKA 10/23/16. Hx of L TKA 04/2016, DM, syncope post-op last surgery, gout.    Clinical Impression   Pt was admitted for the above sx. All education was completed. No further OT is needed at this time    Follow Up Recommendations  Supervision/Assistance - 24 hour    Equipment Recommendations  None recommended by OT    Recommendations for Other Services       Precautions / Restrictions Precautions Precautions: Fall;Knee Required Braces or Orthoses: Knee Immobilizer - Right Restrictions Weight Bearing Restrictions: No RLE Weight Bearing: Weight bearing as tolerated      Mobility Bed Mobility Overal bed mobility: Needs Assistance       Supine to sit: Min assist;HOB elevated     General bed mobility comments: Assist for R LE.   Transfers Overall transfer level: Needs assistance Equipment used: Rolling walker (2 wheeled)   Sit to Stand: Min guard         General transfer comment: for safety    Balance                                           ADL either performed or assessed with clinical judgement   ADL Overall ADL's : Needs assistance/impaired     Grooming: Oral care;Supervision/safety;Standing                   Toilet Transfer: Min guard;Minimal assistance;BSC;RW;Ambulation       Tub/ Shower Transfer: Walk-in shower;Min guard;Ambulation;3 in 1     General ADL Comments: wife present and will assist with adls at home.  Pt is known to this therapist from last admission. Tolerated the above activities well. When walking out of the bathroom, pt lifted walker to clear threshold (which is flat) and had posterior LOB.  Min A given to recover balance.  Pt was aware of what he did. Wife will be home and will guard him at home.  Pt/wife verbalize understanding of  all education and feel comfortable.     Vision         Perception     Praxis      Pertinent Vitals/Pain Pain Assessment: 0-10 Pain Score: 6  Pain Location: R knee  Pain Descriptors / Indicators: Aching;Sore Pain Intervention(s): Limited activity within patient's tolerance;Monitored during session;Premedicated before session;Repositioned;Ice applied     Hand Dominance     Extremity/Trunk Assessment Upper Extremity Assessment Upper Extremity Assessment: Overall WFL for tasks assessed           Communication Communication Communication: No difficulties   Cognition Arousal/Alertness: Awake/alert Behavior During Therapy: WFL for tasks assessed/performed Overall Cognitive Status: Within Functional Limits for tasks assessed                                     General Comments  one posterior LOB; see ADL comments above Pt tends to hold his breath; cues given   Exercises     Shoulder Instructions      Home Living Family/patient expects to be discharged to:: Private residence Living Arrangements: Spouse/significant other Available Help at Discharge: Family  Bathroom Shower/Tub: Occupational psychologist: Handicapped height     Home Equipment: Bedside commode          Prior Functioning/Environment Level of Independence: Independent                 OT Problem List:        OT Treatment/Interventions:      OT Goals(Current goals can be found in the care plan section) Acute Rehab OT Goals Patient Stated Goal: regain PLOF OT Goal Formulation: All assessment and education complete, DC therapy  OT Frequency:     Barriers to D/C:            Co-evaluation              AM-PAC PT "6 Clicks" Daily Activity     Outcome Measure Help from another person eating meals?: None Help from another person taking care of personal grooming?: A Little Help from another person toileting, which includes using toliet,  bedpan, or urinal?: A Little Help from another person bathing (including washing, rinsing, drying)?: A Little Help from another person to put on and taking off regular upper body clothing?: A Little Help from another person to put on and taking off regular lower body clothing?: A Lot 6 Click Score: 18   End of Session    Activity Tolerance: Patient tolerated treatment well Patient left: in chair;with call bell/phone within reach;with family/visitor present  OT Visit Diagnosis: Pain Pain - Right/Left: Right Pain - part of body: Knee                Time: 1975-8832 OT Time Calculation (min): 25 min Charges:  OT General Charges $OT Visit: 1 Procedure OT Evaluation $OT Eval Low Complexity: 1 Procedure OT Treatments $Self Care/Home Management : 8-22 mins G-Codes:     [place], OTR/L [phone] 10/24/2016  Dashawna Delbridge 10/24/2016, 11:12 AM

## 2016-10-24 NOTE — Discharge Summary (Signed)
Physician Discharge Summary   Patient ID: [name] MRN: [id number] DOB/AGE: 1950-10-12 66 y.o.  Admit date: 10/23/2016 Discharge date:  10/25/2016  Primary Diagnosis:  Osteoarthritis  Right knee(s)  Admission Diagnoses:  Past Medical History:  Diagnosis Date  . Anxiety    panic attacks  . Arthritis   . Cancer (HCC)    lip  . Depression   . Diabetes mellitus without complication ([place])    type 2  . GERD (gastroesophageal reflux disease)   . Gout   . History of kidney stones   . Hyperlipidemia   . Hypertension   . Pneumonia    2014 and 2018 ( 2018-klebsiella pneumonia    Discharge Diagnoses:   Active Problems:   OA (osteoarthritis) of knee  Estimated body mass index is 32.74 kg/m as calculated from the following:   Height as of this encounter: '6\' 2"'  (1.88 m).   Weight as of this encounter: 115.7 kg (255 lb).  Procedure:  Procedure(s) (LRB): RIGHT TOTAL KNEE ARTHROPLASTY (Right)   Consults: None  HPI: Evan Rivas is a 66 y.o. year old male with end stage OA of his right knee with progressively worsening pain and dysfunction. He has constant pain, with activity and at rest and significant functional deficits with difficulties even with ADLs. He has had extensive non-op management including analgesics, injections of cortisone, and home exercise program, but remains in significant pain with significant dysfunction. Radiographs show bone on bone arthritis medial and patellofemoral. He presents now for right Total Knee Arthroplasty.   Laboratory Data: Admission on 10/23/2016  Component Date Value Ref Range Status  . Glucose-Capillary 10/23/2016 164* 65 - 99 mg/dL Final  . Comment 1 10/23/2016 Notify RN   Final  . Glucose-Capillary 10/23/2016 150* 65 - 99 mg/dL Final  . Glucose-Capillary 10/23/2016 222* 65 - 99 mg/dL Final  . Glucose-Capillary 10/23/2016 300* 65 - 99 mg/dL Final  . Sodium 10/24/2016 138  135 - 145 mmol/L Final  . Potassium 10/24/2016 3.4*  3.5 - 5.1 mmol/L Final  . Chloride 10/24/2016 103  101 - 111 mmol/L Final  . CO2 10/24/2016 25  22 - 32 mmol/L Final  . Glucose, Bld 10/24/2016 228* 65 - 99 mg/dL Final  . BUN 10/24/2016 17  6 - 20 mg/dL Final  . Creatinine, Ser 10/24/2016 0.98  0.61 - 1.24 mg/dL Final  . Calcium 10/24/2016 8.4* 8.9 - 10.3 mg/dL Final  . GFR calc non Af Amer 10/24/2016 >60  >60 mL/min Final  . GFR calc Af Amer 10/24/2016 >60  >60 mL/min Final   Comment: (NOTE) The eGFR has been calculated using the CKD EPI equation. This calculation has not been validated in all clinical situations. eGFR's persistently <60 mL/min signify possible Chronic Kidney Disease.   . Anion gap 10/24/2016 10  5 - 15 Final  . Glucose-Capillary 10/23/2016 294* 65 - 99 mg/dL Final  . WBC 10/24/2016 11.7* 4.0 - 10.5 K/uL Final   WHITE COUNT CONFIRMED ON SMEAR  . RBC 10/24/2016 4.28  4.22 - 5.81 MIL/uL Final  . Hemoglobin 10/24/2016 13.7  13.0 - 17.0 g/dL Final  . HCT 10/24/2016 38.3* 39.0 - 52.0 % Final  . MCV 10/24/2016 89.5  78.0 - 100.0 fL Final  . MCH 10/24/2016 32.0  26.0 - 34.0 pg Final  . MCHC 10/24/2016 35.8  30.0 - 36.0 g/dL Final  . RDW 10/24/2016 14.1  11.5 - 15.5 % Final  . Platelets 10/24/2016 199  150 - 400 K/uL Final  Comment: SPECIMEN CHECKED FOR CLOTS PLATELET COUNT CONFIRMED BY SMEAR QUANTITY NOT SUFFICIENT TO REPEAT TEST   . Glucose-Capillary 10/24/2016 199* 65 - 99 mg/dL Final  . Glucose-Capillary 10/24/2016 239* 65 - 99 mg/dL Final  . Glucose-Capillary 10/24/2016 233* 65 - 99 mg/dL Final  . Glucose-Capillary 10/24/2016 227* 65 - 99 mg/dL Final  Hospital Outpatient Visit on 10/18/2016  Component Date Value Ref Range Status  . aPTT 10/18/2016 28  24 - 36 seconds Final  . WBC 10/18/2016 7.3  4.0 - 10.5 K/uL Final  . RBC 10/18/2016 4.59  4.22 - 5.81 MIL/uL Final  . Hemoglobin 10/18/2016 14.4  13.0 - 17.0 g/dL Final  . HCT 10/18/2016 42.4  39.0 - 52.0 % Final  . MCV 10/18/2016 92.4  78.0 - 100.0 fL Final    . MCH 10/18/2016 31.4  26.0 - 34.0 pg Final  . MCHC 10/18/2016 34.0  30.0 - 36.0 g/dL Final  . RDW 10/18/2016 14.5  11.5 - 15.5 % Final  . Platelets 10/18/2016 186  150 - 400 K/uL Final  . Sodium 10/18/2016 143  135 - 145 mmol/L Final  . Potassium 10/18/2016 4.0  3.5 - 5.1 mmol/L Final  . Chloride 10/18/2016 102  101 - 111 mmol/L Final  . CO2 10/18/2016 31  22 - 32 mmol/L Final  . Glucose, Bld 10/18/2016 264* 65 - 99 mg/dL Final  . BUN 10/18/2016 23* 6 - 20 mg/dL Final  . Creatinine, Ser 10/18/2016 0.99  0.61 - 1.24 mg/dL Final  . Calcium 10/18/2016 9.5  8.9 - 10.3 mg/dL Final  . Total Protein 10/18/2016 7.4  6.5 - 8.1 g/dL Final  . Albumin 10/18/2016 4.1  3.5 - 5.0 g/dL Final  . AST 10/18/2016 23  15 - 41 U/L Final  . ALT 10/18/2016 15* 17 - 63 U/L Final  . Alkaline Phosphatase 10/18/2016 75  38 - 126 U/L Final  . Total Bilirubin 10/18/2016 0.5  0.3 - 1.2 mg/dL Final  . GFR calc non Af Amer 10/18/2016 >60  >60 mL/min Final  . GFR calc Af Amer 10/18/2016 >60  >60 mL/min Final   Comment: (NOTE) The eGFR has been calculated using the CKD EPI equation. This calculation has not been validated in all clinical situations. eGFR's persistently <60 mL/min signify possible Chronic Kidney Disease.   . Anion gap 10/18/2016 10  5 - 15 Final  . Prothrombin Time 10/18/2016 12.9  11.4 - 15.2 seconds Final  . INR 10/18/2016 0.97   Final  . ABO/RH(D) 10/18/2016 A POS   Final  . Antibody Screen 10/18/2016 NEG   Final  . Sample Expiration 10/18/2016 10/26/2016   Final  . Extend sample reason 10/18/2016 NO TRANSFUSIONS OR PREGNANCY IN THE PAST 3 MONTHS   Final  . MRSA, PCR 10/18/2016 NEGATIVE  NEGATIVE Final  . Staphylococcus aureus 10/18/2016 NEGATIVE  NEGATIVE Final   Comment:        The Xpert SA Assay (FDA approved for NASAL specimens in patients over 30 years of age), is one component of a comprehensive surveillance program.  Test performance has been validated by [facility] for patients  greater than or equal to 60 year old. It is not intended to diagnose infection nor to guide or monitor treatment.   . Glucose-Capillary 10/18/2016 255* 65 - 99 mg/dL Final  Office Visit on 09/11/2016  Component Date Value Ref Range Status  . Hemoglobin A1C 09/11/2016 7.1   Final     X-Rays:Dg Chest 2 View  Result  Date: 10/18/2016 CLINICAL DATA:  Hypertension.  Preoperative total knee replacement EXAM: CHEST  2 VIEW COMPARISON:  July 19, 2016 FINDINGS: There is no appreciable edema or consolidation. The heart size and pulmonary vascularity are normal. No adenopathy. There is mild degenerative change in the thoracic spine. IMPRESSION: No edema or consolidation. Electronically Signed   By: Lowella Grip III M.D.   On: 10/18/2016 14:34    EKG: Orders placed or performed during the hospital encounter of 07/19/16  . EKG 12-Lead  . EKG 12-Lead     Hospital Course: Evan Rivas is a 66 y.o. who was admitted to [facility]. They were brought to the operating room on 10/23/2016 and underwent Procedure(s): RIGHT TOTAL KNEE ARTHROPLASTY.  Patient tolerated the procedure well and was later transferred to the recovery room and then to the orthopaedic floor for postoperative care.  They were given PO and IV analgesics for pain control following their surgery.  They were given 24 hours of postoperative antibiotics of  Anti-infectives    Start     Dose/Rate Route Frequency Ordered Stop   10/23/16 1300  ceFAZolin (ANCEF) IVPB 2g/100 mL premix     2 g 200 mL/hr over 30 Minutes Intravenous Every 6 hours 10/23/16 1001 10/23/16 2001   10/23/16 0541  ceFAZolin (ANCEF) 2-4 GM/100ML-% IVPB    Comments:  [place] Session   : cabinet override      10/23/16 0541 10/23/16 1744   10/23/16 0524  ceFAZolin (ANCEF) IVPB 2g/100 mL premix     2 g 200 mL/hr over 30 Minutes Intravenous On call to O.R. 10/23/16 3559 10/23/16 0711     and started on DVT prophylaxis in the form of Xarelto.   PT and OT  were ordered for total joint protocol.  Discharge planning consulted to help with postop disposition and equipment needs.  Patient had a good night on the evening of surgery  And walked 50 feet the day of surgery.  They started to get up OOB with therapy on day one. Hemovac drain was pulled without difficulty.  Continued to work with therapy into day two.  Dressing was changed on day two and the incision was healing well. Patient was seen in rounds and was ready to go home.   Diet: Cardiac diet and Diabetic diet Activity:WBAT Follow-up:in 2 weeks Disposition - Home Discharged Condition: good   Discharge Instructions    Call MD / Call 911    Complete by:  As directed    If you experience chest pain or shortness of breath, CALL 911 and be transported to the hospital emergency room.  If you develope a fever above 101 F, pus (white drainage) or increased drainage or redness at the wound, or calf pain, call your surgeon's office.   Change dressing    Complete by:  As directed    Change dressing daily with sterile 4 x 4 inch gauze dressing and apply TED hose. Do not submerge the incision under water.   Constipation Prevention    Complete by:  As directed    Drink plenty of fluids.  Prune juice may be helpful.  You may use a stool softener, such as Colace (over the counter) 100 mg twice a day.  Use MiraLax (over the counter) for constipation as needed.   Diet - low sodium heart healthy    Complete by:  As directed    Diet Carb Modified    Complete by:  As directed    Discharge  instructions    Complete by:  As directed    Take Xarelto for two and a half more weeks, then discontinue Xarelto. Once the patient has completed the Xarelto, they may resume the 81 mg Aspirin.   Pick up stool softner and laxative for home use following surgery while on pain medications. Do not submerge incision under water. Please use good hand washing techniques while changing dressing each day. May shower starting  three days after surgery. Please use a clean towel to pat the incision dry following showers. Continue to use ice for pain and swelling after surgery. Do not use any lotions or creams on the incision until instructed by your surgeon.  Wear both TED hose on both legs during the day every day for three weeks, but may remove the TED hose at night at home.  Postoperative Constipation Protocol  Constipation - defined medically as fewer than three stools per week and severe constipation as less than one stool per week.  One of the most common issues patients have following surgery is constipation.  Even if you have a regular bowel pattern at home, your normal regimen is likely to be disrupted due to multiple reasons following surgery.  Combination of anesthesia, postoperative narcotics, change in appetite and fluid intake all can affect your bowels.  In order to avoid complications following surgery, here are some recommendations in order to help you during your recovery period.  Colace (docusate) - Pick up an over-the-counter form of Colace or another stool softener and take twice a day as long as you are requiring postoperative pain medications.  Take with a full glass of water daily.  If you experience loose stools or diarrhea, hold the colace until you stool forms back up.  If your symptoms do not get better within 1 week or if they get worse, check with your doctor.  Dulcolax (bisacodyl) - Pick up over-the-counter and take as directed by the product packaging as needed to assist with the movement of your bowels.  Take with a full glass of water.  Use this product as needed if not relieved by Colace only.   MiraLax (polyethylene glycol) - Pick up over-the-counter to have on hand.  MiraLax is a solution that will increase the amount of water in your bowels to assist with bowel movements.  Take as directed and can mix with a glass of water, juice, soda, coffee, or tea.  Take if you go more than two days  without a movement. Do not use MiraLax more than once per day. Call your doctor if you are still constipated or irregular after using this medication for 7 days in a row.  If you continue to have problems with postoperative constipation, please contact the office for further assistance and recommendations.  If you experience "the worst abdominal pain ever" or develop nausea or vomiting, please contact the office immediatly for further recommendations for treatment.   Do not put a pillow under the knee. Place it under the heel.    Complete by:  As directed    Do not sit on low chairs, stoools or toilet seats, as it may be difficult to get up from low surfaces    Complete by:  As directed    Driving restrictions    Complete by:  As directed    No driving until released by the physician.   Increase activity slowly as tolerated    Complete by:  As directed    Lifting restrictions  Complete by:  As directed    No lifting until released by the physician.   Patient may shower    Complete by:  As directed    You may shower without a dressing once there is no drainage.  Do not wash over the wound.  If drainage remains, do not shower until drainage stops.   TED hose    Complete by:  As directed    Use stockings (TED hose) for 3 weeks on both leg(s).  You may remove them at night for sleeping.   Weight bearing as tolerated    Complete by:  As directed    Laterality:  right   Extremity:  Lower     Allergies as of 10/24/2016      Reactions   Celebrex  [celecoxib]    GI   Tetanus Toxoids Other (See Comments)   unkown   Tetanus-diphtheria Toxoids Td    Other reaction(s): Unknown      Medication List    STOP taking these medications   aspirin 81 MG tablet   HYDROcodone-acetaminophen 10-325 MG tablet Commonly known as:  NORCO   MULTIVITAMINS PO   nabumetone 500 MG tablet Commonly known as:  RELAFEN     TAKE these medications   allopurinol 300 MG tablet Commonly known as:   ZYLOPRIM Take 1 tablet (300 mg total) by mouth every evening.   amLODipine 10 MG tablet Commonly known as:  NORVASC Take 1 tablet (10 mg total) by mouth daily.   atorvastatin 40 MG tablet Commonly known as:  LIPITOR Take 1 tablet (40 mg total) by mouth at bedtime.   clonazePAM 1 MG tablet Commonly known as:  KLONOPIN Take 1 tablet (1 mg total) by mouth 2 (two) times daily as needed. What changed:  reasons to take this   FLUoxetine 40 MG capsule Commonly known as:  PROZAC Take 1 capsule (40 mg total) by mouth every morning.   fluticasone 50 MCG/ACT nasal spray Commonly known as:  FLONASE Place 2 sprays into both nostrils daily. 2 sprays What changed:  when to take this  reasons to take this  additional instructions   glipiZIDE 5 MG tablet Commonly known as:  GLUCOTROL Take 1 tablet (5 mg total) by mouth daily.   losartan 100 MG tablet Commonly known as:  COZAAR Take 1 tablet (100 mg total) by mouth at bedtime.   metFORMIN 500 MG 24 hr tablet Commonly known as:  GLUCOPHAGE-XR Take 2 tablets (1,000 mg total) by mouth 2 (two) times daily. 1091m BID   methocarbamol 500 MG tablet Commonly known as:  ROBAXIN Take 1 tablet (500 mg total) by mouth every 6 (six) hours as needed for muscle spasms.   metoprolol succinate 25 MG 24 hr tablet Commonly known as:  TOPROL-XL Take 1 tablet (25 mg total) by mouth every morning.   metoprolol succinate 100 MG 24 hr tablet Commonly known as:  TOPROL-XL Take 1 tablet (100 mg total) by mouth every morning.   oxyCODONE 5 MG immediate release tablet Commonly known as:  Oxy IR/ROXICODONE Take 1-4 tablets (5-20 mg total) by mouth every 4 (four) hours as needed for moderate pain or severe pain.   rivaroxaban 10 MG Tabs tablet Commonly known as:  XARELTO Take 1 tablet (10 mg total) by mouth daily with breakfast. Take Xarelto for two and a half more weeks following discharge from the hospital, then discontinue Xarelto. Once the patient  has completed the Xarelto, they may resume the 81 mg Aspirin.  tamsulosin 0.4 MG Caps capsule Commonly known as:  FLOMAX Take 1 capsule (0.4 mg total) by mouth daily after supper.   tiZANidine 4 MG tablet Commonly known as:  ZANAFLEX Take 1 tablet (4 mg total) by mouth at bedtime.   traMADol 50 MG tablet Commonly known as:  ULTRAM Take 1-2 tablets (50-100 mg total) by mouth every 6 (six) hours as needed for moderate pain.      Follow-up Information    Gaynelle Arabian, MD. Schedule an appointment as soon as possible for a visit on 11/07/2016.   Specialty:  Orthopedic Surgery Contact information: [address] [place] [address] [phone]           Signed: Arlee Muslim, PA-C Orthopaedic Surgery 10/24/2016, 9:35 PM

## 2016-10-25 ENCOUNTER — Encounter (HOSPITAL_COMMUNITY): Payer: Self-pay | Admitting: Orthopedic Surgery

## 2016-10-25 LAB — CBC
HCT: 35.6 % — ABNORMAL LOW (ref 39.0–52.0)
HEMOGLOBIN: 12.2 g/dL — AB (ref 13.0–17.0)
MCH: 31 pg (ref 26.0–34.0)
MCHC: 34.3 g/dL (ref 30.0–36.0)
MCV: 90.4 fL (ref 78.0–100.0)
Platelets: 194 10*3/uL (ref 150–400)
RBC: 3.94 MIL/uL — AB (ref 4.22–5.81)
RDW: 14.2 % (ref 11.5–15.5)
WBC: 10.4 10*3/uL (ref 4.0–10.5)

## 2016-10-25 LAB — BASIC METABOLIC PANEL
Anion gap: 11 (ref 5–15)
BUN: 15 mg/dL (ref 6–20)
CO2: 28 mmol/L (ref 22–32)
CREATININE: 1.02 mg/dL (ref 0.61–1.24)
Calcium: 8.6 mg/dL — ABNORMAL LOW (ref 8.9–10.3)
Chloride: 102 mmol/L (ref 101–111)
Glucose, Bld: 210 mg/dL — ABNORMAL HIGH (ref 65–99)
Potassium: 3.1 mmol/L — ABNORMAL LOW (ref 3.5–5.1)
SODIUM: 141 mmol/L (ref 135–145)

## 2016-10-25 LAB — GLUCOSE, CAPILLARY: GLUCOSE-CAPILLARY: 201 mg/dL — AB (ref 65–99)

## 2016-10-25 NOTE — Progress Notes (Signed)
Physical Therapy Treatment Patient Details Name: Evan Rivas MRN: [id number] DOB: 04/26/1950 Today's Date: 10/25/2016    History of Present Illness 66 yo male s/p R TKA 10/23/16. Hx of L TKA 04/2016, DM, syncope post-op last surgery, gout.     PT Comments    Progressing well with mobility. Reviewed exercises and gait training. Verbally reviewed 1 step negotiation-pt did not feel he needed to practice. Instructed pt to perform exercises 2x/day until he begins HHPT. All education completed. Ready to d/c from PT standpoint.     Follow Up Recommendations  DC plan and follow up therapy as arranged by surgeon [facility])     Equipment Recommendations  None recommended by PT    Recommendations for Other Services       Precautions / Restrictions Precautions Precautions: Fall;Knee Required Braces or Orthoses: Knee Immobilizer - Right Knee Immobilizer - Right: Discontinue once straight leg raise with < 10 degree lag Restrictions Weight Bearing Restrictions: No RLE Weight Bearing: Weight bearing as tolerated    Mobility  Bed Mobility Overal bed mobility: Needs Assistance Bed Mobility: Supine to Sit;Sit to Supine     Supine to sit: Min assist;HOB elevated Sit to supine: Min assist;HOB elevated   General bed mobility comments: Assist for R LE.   Transfers Overall transfer level: Needs assistance Equipment used: Rolling walker (2 wheeled) Transfers: Sit to/from Stand Sit to Stand: Min guard         General transfer comment: VCs safety, hand/LE placement. close guard for safety  Ambulation/Gait Ambulation/Gait assistance: Min guard Ambulation Distance (Feet): 125 Feet Assistive device: Rolling walker (2 wheeled) Gait Pattern/deviations: Step-to pattern     General Gait Details: close guard for safety.    Stairs            Wheelchair Mobility    Modified Rankin (Stroke Patients Only)       Balance                                             Cognition Arousal/Alertness: Awake/alert Behavior During Therapy: WFL for tasks assessed/performed Overall Cognitive Status: Within Functional Limits for tasks assessed                                        Exercises Total Joint Exercises Ankle Circles/Pumps: AROM;Both;10 reps;Supine Quad Sets: AROM;Both;10 reps;Supine Heel Slides: AAROM;Right;Supine;10 reps Hip ABduction/ADduction: AAROM;Right;10 reps;Supine;AROM Straight Leg Raises: AROM;Right;10 reps;Supine Goniometric ROM: ~5-70 degrees    General Comments        Pertinent Vitals/Pain Pain Assessment: 0-10 Pain Score: 5  Pain Location: R knee/thigh Pain Descriptors / Indicators: Aching;Sore Pain Intervention(s): Monitored during session;Ice applied    Home Living                      Prior Function            PT Goals (current goals can now be found in the care plan section) Progress towards PT goals: Progressing toward goals    Frequency    7X/week      PT Plan Current plan remains appropriate    Co-evaluation              AM-PAC PT "6 Clicks" Daily Activity  Outcome Measure  Difficulty turning over in  bed (including adjusting bedclothes, sheets and blankets)?: A Little Difficulty moving from lying on back to sitting on the side of the bed? : Total Difficulty sitting down on and standing up from a chair with arms (e.g., wheelchair, bedside commode, etc,.)?: A Little Help needed moving to and from a bed to chair (including a wheelchair)?: A Little Help needed walking in hospital room?: A Little Help needed climbing 3-5 steps with a railing? : A Little 6 Click Score: 16    End of Session Equipment Utilized During Treatment: Gait belt;Right knee immobilizer Activity Tolerance: Patient tolerated treatment well Patient left: in bed;with call bell/phone within reach   PT Visit Diagnosis: Muscle weakness (generalized) (M62.81);Difficulty in walking, not elsewhere  classified (R26.2)     Time: 5397-6734 PT Time Calculation (min) (ACUTE ONLY): 24 min  Charges:  $Gait Training: 8-22 mins $Therapeutic Exercise: 8-22 mins                    G Codes:          Weston Anna, MPT Pager: [phone]

## 2016-10-25 NOTE — Progress Notes (Signed)
Pt to d/c home with [facility] for HHPT. No DME needs. AVS reviewed and "My Chart" discussed with pt. Pt capable of verbalizing medications, dressing changes, signs and symptoms of infection, and follow-up appointments. Remains hemodynamically stable. No signs and symptoms of distress. Educated pt to return to ER in the case of SOB, dizziness, or chest pain.

## 2016-10-25 NOTE — Care Management Note (Signed)
Case Management Note  Patient Details  Name: SHERIF MILLSPAUGH MRN: [id number] Date of Birth: [birth year]  Subjective/Objective: 66 y/o m admitted w/OA R knee. S/p R TKA. Patient has 3n1,rw. [facility] agree to [facility] Tim aware of HHPT ordered & d/c today. No further CM needs.                   Action/Plan:d/c home w/HHC.   Expected Discharge Date:  10/25/16               Expected Discharge Plan:  [place]  In-House Referral:     Discharge planning Services  CM Consult  Post Acute Care Choice:  Durable Medical Equipment (has 3n1,rw) Choice offered to:     DME Arranged:    DME Agency:     HH Arranged:  PT [place]:  [facility] (formerly [employer])  Status of Service:  Completed, signed off  If discussed at [employer], dates discussed:    Additional Comments:  Dessa Phi, RN 10/25/2016, 9:52 AM

## 2016-10-26 DIAGNOSIS — E119 Type 2 diabetes mellitus without complications: Secondary | ICD-10-CM | POA: Diagnosis not present

## 2016-10-26 DIAGNOSIS — Z471 Aftercare following joint replacement surgery: Secondary | ICD-10-CM | POA: Diagnosis not present

## 2016-10-26 DIAGNOSIS — M519 Unspecified thoracic, thoracolumbar and lumbosacral intervertebral disc disorder: Secondary | ICD-10-CM | POA: Diagnosis not present

## 2016-10-26 DIAGNOSIS — M109 Gout, unspecified: Secondary | ICD-10-CM | POA: Diagnosis not present

## 2016-10-26 DIAGNOSIS — M810 Age-related osteoporosis without current pathological fracture: Secondary | ICD-10-CM | POA: Diagnosis not present

## 2016-10-26 DIAGNOSIS — M7061 Trochanteric bursitis, right hip: Secondary | ICD-10-CM | POA: Diagnosis not present

## 2016-10-27 DIAGNOSIS — E119 Type 2 diabetes mellitus without complications: Secondary | ICD-10-CM | POA: Diagnosis not present

## 2016-10-27 DIAGNOSIS — M7061 Trochanteric bursitis, right hip: Secondary | ICD-10-CM | POA: Diagnosis not present

## 2016-10-27 DIAGNOSIS — M109 Gout, unspecified: Secondary | ICD-10-CM | POA: Diagnosis not present

## 2016-10-27 DIAGNOSIS — Z471 Aftercare following joint replacement surgery: Secondary | ICD-10-CM | POA: Diagnosis not present

## 2016-10-27 DIAGNOSIS — M519 Unspecified thoracic, thoracolumbar and lumbosacral intervertebral disc disorder: Secondary | ICD-10-CM | POA: Diagnosis not present

## 2016-10-27 DIAGNOSIS — M810 Age-related osteoporosis without current pathological fracture: Secondary | ICD-10-CM | POA: Diagnosis not present

## 2016-10-30 DIAGNOSIS — M810 Age-related osteoporosis without current pathological fracture: Secondary | ICD-10-CM | POA: Diagnosis not present

## 2016-10-30 DIAGNOSIS — M109 Gout, unspecified: Secondary | ICD-10-CM | POA: Diagnosis not present

## 2016-10-30 DIAGNOSIS — M7061 Trochanteric bursitis, right hip: Secondary | ICD-10-CM | POA: Diagnosis not present

## 2016-10-30 DIAGNOSIS — M519 Unspecified thoracic, thoracolumbar and lumbosacral intervertebral disc disorder: Secondary | ICD-10-CM | POA: Diagnosis not present

## 2016-10-30 DIAGNOSIS — E119 Type 2 diabetes mellitus without complications: Secondary | ICD-10-CM | POA: Diagnosis not present

## 2016-10-30 DIAGNOSIS — Z471 Aftercare following joint replacement surgery: Secondary | ICD-10-CM | POA: Diagnosis not present

## 2016-11-01 DIAGNOSIS — M519 Unspecified thoracic, thoracolumbar and lumbosacral intervertebral disc disorder: Secondary | ICD-10-CM | POA: Diagnosis not present

## 2016-11-01 DIAGNOSIS — Z471 Aftercare following joint replacement surgery: Secondary | ICD-10-CM | POA: Diagnosis not present

## 2016-11-01 DIAGNOSIS — M7061 Trochanteric bursitis, right hip: Secondary | ICD-10-CM | POA: Diagnosis not present

## 2016-11-01 DIAGNOSIS — M810 Age-related osteoporosis without current pathological fracture: Secondary | ICD-10-CM | POA: Diagnosis not present

## 2016-11-01 DIAGNOSIS — E119 Type 2 diabetes mellitus without complications: Secondary | ICD-10-CM | POA: Diagnosis not present

## 2016-11-01 DIAGNOSIS — M109 Gout, unspecified: Secondary | ICD-10-CM | POA: Diagnosis not present

## 2016-11-03 ENCOUNTER — Other Ambulatory Visit: Payer: Self-pay | Admitting: Family Medicine

## 2016-11-03 DIAGNOSIS — M810 Age-related osteoporosis without current pathological fracture: Secondary | ICD-10-CM | POA: Diagnosis not present

## 2016-11-03 DIAGNOSIS — F331 Major depressive disorder, recurrent, moderate: Secondary | ICD-10-CM

## 2016-11-03 DIAGNOSIS — M109 Gout, unspecified: Secondary | ICD-10-CM | POA: Diagnosis not present

## 2016-11-03 DIAGNOSIS — M7061 Trochanteric bursitis, right hip: Secondary | ICD-10-CM | POA: Diagnosis not present

## 2016-11-03 DIAGNOSIS — M519 Unspecified thoracic, thoracolumbar and lumbosacral intervertebral disc disorder: Secondary | ICD-10-CM | POA: Diagnosis not present

## 2016-11-03 DIAGNOSIS — Z471 Aftercare following joint replacement surgery: Secondary | ICD-10-CM | POA: Diagnosis not present

## 2016-11-03 DIAGNOSIS — E119 Type 2 diabetes mellitus without complications: Secondary | ICD-10-CM | POA: Diagnosis not present

## 2016-11-07 ENCOUNTER — Ambulatory Visit: Payer: Medicare PPO | Attending: Orthopedic Surgery

## 2016-11-07 DIAGNOSIS — M6281 Muscle weakness (generalized): Secondary | ICD-10-CM | POA: Insufficient documentation

## 2016-11-07 DIAGNOSIS — M25561 Pain in right knee: Secondary | ICD-10-CM | POA: Insufficient documentation

## 2016-11-07 DIAGNOSIS — R262 Difficulty in walking, not elsewhere classified: Secondary | ICD-10-CM | POA: Diagnosis not present

## 2016-11-07 DIAGNOSIS — M25562 Pain in left knee: Secondary | ICD-10-CM

## 2016-11-07 NOTE — Therapy (Signed)
[facility] [address], [place], [address] Phone: [phone]   Fax:  [phone]  Physical Therapy Discharge Summary  L TKA (last treatment on 06/22/2016)  Patient Details  Name: Evan Rivas MRN: [id number] Date of Birth: 1950-12-05 Referring Provider: Gaynelle Arabian, MD  Encounter Date: 11/07/2016      PT End of Session - 11/07/16 1024       Past Medical History:  Diagnosis Date  . Anxiety    panic attacks  . Arthritis   . Cancer (HCC)    lip  . Depression   . Diabetes mellitus without complication ([place])    type 2  . GERD (gastroesophageal reflux disease)   . Gout   . History of kidney stones   . Hyperlipidemia   . Hypertension   . Pneumonia    2014 and 2018 ( 2018-klebsiella pneumonia     Past Surgical History:  Procedure Laterality Date  . ARTHROSCOPIC REPAIR ACL Left   . CYSTOSCOPY W/ URETERAL STENT PLACEMENT Right 09/07/2014   Procedure: CYSTOSCOPY WITH STENT REPLACEMENT;  Surgeon: Hollice Espy, MD;  Location: [facility];  Service: Urology;  Laterality: Right;  . CYSTOSCOPY/URETEROSCOPY/HOLMIUM LASER/STENT PLACEMENT Right 08/17/2014   Procedure: CYSTOSCOPY/URETEROSCOPY//STENT PLACEMENT/ attempt of lithotripsy;  Surgeon: Hollice Espy, MD;  Location: [facility];  Service: Urology;  Laterality: Right;  . HERNIA REPAIR    . MOHS SURGERY     lip  . TOTAL KNEE ARTHROPLASTY Left 04/24/2016   Procedure: LEFT TOTAL KNEE ARTHROPLASTY;  Surgeon: Gaynelle Arabian, MD;  Location: [facility];  Service: Orthopedics;  Laterality: Left;  Adductor Block  . TOTAL KNEE ARTHROPLASTY Right 10/23/2016   Procedure: RIGHT TOTAL KNEE ARTHROPLASTY;  Surgeon: Gaynelle Arabian, MD;  Location: [facility];  Service: Orthopedics;  Laterality: Right;  . URETEROSCOPY WITH HOLMIUM LASER LITHOTRIPSY Right 09/07/2014   Procedure: URETEROSCOPY WITH HOLMIUM LASER LITHOTRIPSY;  Surgeon: Hollice Espy, MD;  Location: [facility];  Service:  Urology;  Laterality: Right;  [name] VASECTOMY      There were no vitals filed for this visit.      Subjective Assessment - 11/07/16 1022    Subjective Pt called (a few months ago) and said that he saw his surgeon who told him that his L knee is strong enough to discharge from PT.       L TKA 04/24/2016           [facility] PT Assessment - 06/22/16 1711            Strength   Left Knee Flexion 4/5   Left Knee Extension 5/5                                PT Long Term Goals            PT LONG TERM GOAL #1   Title Patient will improve L knee extension seated AROM to at least -5 degrees to promote ability to ambulate and perform standing tasks.    Baseline Seated L knee extension AROM -19 degrees (05/15/2016); -16 degrees (06/22/2016)   Time 8   Period Weeks   Status Ongoing       PT LONG TERM GOAL #2   Title Patient will improve L knee flexion seated AROM to at least 115 degrees to promote ability to ambulate, get into and out of cars, negotiate stairs, perform standing tassks.  Baseline Seated L knee flexion AROM 71 degrees (05/15/2016); 113 degrees (07-14-16)   Time 8   Period Weeks   Status Partially met       PT LONG TERM GOAL #3   Title Patient will improve his LEFS score by at least 9 points as a demonstration of improved function.    Baseline 11/80 (05/15/2016)   Time 8   Period Weeks   Status Unable to assess       PT LONG TERM GOAL #4   Title Patient will be able to ambulate at least 500 ft without AD to promote mobility.    Baseline Pt currently ambulates with rw (05/15/2016); Pt able to ambulate 200 ft without use of AD and without LOB (14-Jul-2016)   Time 8   Period Weeks   Status Partially met       PT LONG TERM GOAL #5   Title Patient will have 5/5 L knee extension strength and at least 4+/5 L knee flexion strength to promote ability to ambulate as well as to perform functional tasks.    Baseline L  knee flexion and extension strength not yet tested to allow for more healing since pt is only 3 weeks post op (05/15/2016); L knee flexion 4/5, L knee extension 5/5 (14-Jul-2016)   Time 8   Period Weeks   Status ongoing      G-Codes - based on 2016-07-14 visit   Functional Assessment Tool Used clinical presentation, patient interview   Functional Limitation Mobility: Walking and moving around       Mobility: Walking and Moving Around Goal Status [phone]) At least 20 percent but less than 40 percent impaired, limited or restricted   Mobility: Walking and Moving Around discharge Status (H0388) : At least 20 percent but less than 40 percent impaired, limited or restricted          Plan - 11/07/16 1308    Clinical Impression Statement Pt demonstrates improved L knee flexion ROM, strength, and ability to ambulate with less need for AD since initial evaluation. Pt also demonstrates consistency with his HEP. Per pt, he saw his MD for a follow up on his L knee replacement surgery who told him that his L knee is strong enough for discharge. Skilled physical therapy services discharged with pt continuing progress with his HEP.    PT Treatment/Interventions Neuromuscular re-education;Therapeutic exercise;Therapeutic activities;Gait training;Manual techniques;Patient/family education   PT Next Visit Plan Continue progress with his HEP.    Consulted and Agree with Plan of Care Patient      Patient will benefit from skilled therapeutic intervention in order to improve the following deficits and impairments:     Visit Diagnosis: Difficulty in walking, not elsewhere classified  Muscle weakness (generalized)  Left knee pain, unspecified chronicity     Problem List Patient Active Problem List   Diagnosis Date Noted  . Pneumonia 07/19/2016  . BPH without urinary obstruction 06/03/2015  . Depression 03/05/2015  . Hyperlipidemia 09/03/2014  . Major depressive disorder, recurrent  episode, moderate ([place]) 09/03/2014  . Chronic low back pain 09/03/2014  . Arthralgia of both knees 09/03/2014  . Type 2 diabetes mellitus without complication ([place]) 82/80/0349  . Allergic rhinitis 07/04/2014  . Anxiety 07/04/2014  . Continuous opioid dependence ([place]) 07/04/2014  . DDD (degenerative disc disease), lumbar 07/04/2014  . Gout of foot 07/04/2014  . BP (high blood pressure) 07/04/2014  . Flu vaccine need 07/04/2014  . Adiposity 07/04/2014  . OA (osteoarthritis) of knee 07/04/2014  .  Neuralgia neuritis, sciatic nerve 07/04/2014  . Arthralgia of multiple joints 07/04/2014  . H/O malignant neoplasm of skin 12/24/2013    Thank you for your referral.  Joneen Boers PT, DPT   11/07/2016, 1:11 PM  [place] [facility] [address], [place], [address] Phone: [phone]   Fax:  [phone]  Name: Evan Rivas MRN: [id number] Date of Birth: 05-26-1950

## 2016-11-07 NOTE — Therapy (Signed)
[place] [facility] [address] [place], [address] Phone: [phone]   Fax:  [phone]  Physical Therapy Evaluation Right TKA   Patient Details  Name: Evan Rivas MRN: [id number] Date of Birth: 1950/09/16 Referring Provider: Gaynelle Arabian, MD   Encounter Date: 11/07/2016      PT End of Session - 11/07/16 1319    Visit Number 1   Number of Visits 13   Date for PT Re-Evaluation 12/21/16   Authorization Type 1   Authorization Time Period of 10 g-code   PT Start Time 0817   PT Stop Time 0900   PT Time Calculation (min) 43 min   Activity Tolerance Patient tolerated treatment well   Behavior During Therapy [facility] for tasks assessed/performed      Past Medical History:  Diagnosis Date  . Anxiety    panic attacks  . Arthritis   . Cancer (HCC)    lip  . Depression   . Diabetes mellitus without complication ([place])    type 2  . GERD (gastroesophageal reflux disease)   . Gout   . History of kidney stones   . Hyperlipidemia   . Hypertension   . Pneumonia    2014 and 2018 ( 2018-klebsiella pneumonia     Past Surgical History:  Procedure Laterality Date  . ARTHROSCOPIC REPAIR ACL Left   . CYSTOSCOPY W/ URETERAL STENT PLACEMENT Right 09/07/2014   Procedure: CYSTOSCOPY WITH STENT REPLACEMENT;  Surgeon: Hollice Espy, MD;  Location: [facility];  Service: Urology;  Laterality: Right;  . CYSTOSCOPY/URETEROSCOPY/HOLMIUM LASER/STENT PLACEMENT Right 08/17/2014   Procedure: CYSTOSCOPY/URETEROSCOPY//STENT PLACEMENT/ attempt of lithotripsy;  Surgeon: Hollice Espy, MD;  Location: [facility];  Service: Urology;  Laterality: Right;  . HERNIA REPAIR    . MOHS SURGERY     lip  . TOTAL KNEE ARTHROPLASTY Left 04/24/2016   Procedure: LEFT TOTAL KNEE ARTHROPLASTY;  Surgeon: Gaynelle Arabian, MD;  [facility];  Service: Orthopedics;  Laterality: Left;  Adductor Block  . TOTAL KNEE ARTHROPLASTY Right 10/23/2016   Procedure:  RIGHT TOTAL KNEE ARTHROPLASTY;  Surgeon: Gaynelle Arabian, MD;  Location: [facility];  Service: Orthopedics;  Laterality: Right;  . URETEROSCOPY WITH HOLMIUM LASER LITHOTRIPSY Right 09/07/2014   Procedure: URETEROSCOPY WITH HOLMIUM LASER LITHOTRIPSY;  Surgeon: Hollice Espy, MD;  Location: [facility];  Service: Urology;  Laterality: Right;  [name] VASECTOMY      There were no vitals filed for this visit.       Subjective Assessment - 11/07/16 1316    Subjective R knee pain: 3/10 currently, 2/10 at best, 8/10 at worst   Pertinent History S/P R TKA on 10/23/2016.  Pt states that his blood pressure is controlled. Had home health PT afterwards which included R knee ROM, hip exercises, and gait. R knee feels a little bit sore currently.    Patient Stated Goals Get it to the point where his L knee is (function). Be able to walk without hurting, play golf, go to the grocery store without looking for a place to sit down.    Currently in Pain? Yes   Pain Score 3    Pain Location Knee   Pain Orientation Right   Pain Descriptors / Indicators Aching;Burning;Throbbing;Sore   Pain Type Surgical pain   Pain Onset 1 to 4 weeks ago   Pain Frequency Constant   Aggravating Factors  Bending his knee, healing process, standing after prolonged sitting   Pain Relieving Factors ice, pain  medication            [facility] PT Assessment - 11/07/16 0819      Assessment   Medical Diagnosis S/P R TKA   Referring Provider Gaynelle Arabian, MD   Onset Date/Surgical Date 10/23/16   Next MD Visit 11/07/2016   Prior Therapy Had home health PT which included R knee ROM, hip exercises, and gait     Restrictions   Other Position/Activity Restrictions WBAT     Home Environment   Additional Comments Patient lives in a 1 story home with his wife, no steps.      Prior Function   Vocation Retired   Biomedical scientist PLOF: able to ambulate independenly but with a lot of knee pain.      Observation/Other Assessments   Observations  R knee surgical incision covered by gauze and steri strips. Seems to be healing satisfactorily based on the skin around dressing     Posture/Postural Control   Posture Comments Bilaterally protracted shoulders and neck, decreased lordosis, slight R posterior pelvic rotation, decreased bilateral hip extension, decreased bilateral knee extension R > L     AROM   Right Knee Extension -17   Right Knee Flexion 76  79 degrees AAROM   Left Knee Extension -15   Left Knee Flexion 117     Strength   Overall Strength Comments R knee flexion and extension strength not yet assessed to allow for more healing.    Left Knee Flexion 5/5   Left Knee Extension 5/5     Palpation   Palpation comment swelling, decreased soft tissue mobility anterior knee and distal quadriceps, hamstring tightness     Ambulation/Gait   Gait Comments ambulates with rw, antalgic, decreased stance R LE, decreased R knee extension during stance phase of gait.             Objective measurements completed on examination: See above findings.   S/P 2 weeks Surgical incision covered by gauze and steri strips. Seems to be healing satisfactorily based on the skin around dressing  Manual therapy  STM R posterior hamstrings in sitting, knee in comfortable extension       There-ex  Supine R knee extension stretch 2 min  Seated R knee flexion/extension AAROM stretch using a small ball 10x5 seconds each direction  Reviewed plan of care: 2x/week for 6 weeks  Improved exercise technique, movement at target joints, use of target muscles after min to mod verbal, visual, tactile cues.     Patient is a 66 year old male who came to physical therapy S/P R TKA on 10/23/2016. Pt is 2 weeks post op. He also presents with R knee swelling, decreased soft tissue mobility, hamstring tightness, altered gait pattern and posture, weakness, and difficulty performing functional tasks such as walking and sit <> stand transfers. Patient  will benefit from skilled physical therapy services to address the aforementioned deficits.             PT Education - 11/07/16 1318    Education provided Yes   Education Details ther-ex, HEP, plan of care   Person(s) Educated Patient   Methods Explanation;Demonstration;Tactile cues;Verbal cues;Handout   Comprehension Verbalized understanding;Returned demonstration             PT Long Term Goals - 11/07/16 1332      PT LONG TERM GOAL #1   Title Patient will improve R knee extension AROM to -10 degrees or less to promote ability to ambulate and perform standing  tasks.    Baseline Seated R knee extension AROM -17 degrees (11/07/2016)   Time 6   Period Weeks   Status New   Target Date 12/21/16     PT LONG TERM GOAL #2   Title Patient will improve R knee flexion seated AROM to at least 115 degrees to promote ability to ambulate, perform sit <> stand transfers, negotiate stairs, perform standing tasks.    Baseline seated R knee flexion AROM 76 degrees (11/07/2016)   Time 6   Period Weeks   Status New   Target Date 12/21/16     PT LONG TERM GOAL #3   Title Patient will be able to ambulate at least 500 ft without use of AD to promote mobility.    Baseline Pt currently ambulates with a rolling walker (11/07/2016)   Time 6   Period Weeks   Status New   Target Date 12/21/16     PT LONG TERM GOAL #4   Title Pt will demonstrate at least 4+/5 R knee flexion and 5/5 R knee extension strength to promote ability to perform standing tasks.    Baseline R knee flexion and extension strength not yet tested secondary to pt being only 2 weeks since surgery to allow for more healing (11/07/2016)   Time 6   Period Weeks   Status New   Target Date 12/21/16                Plan - 11/07/16 1321    Clinical Impression Statement Patient is a 66 year old male who came to physical therapy S/P R TKA on 10/23/2016. Pt is 2 weeks post op. He also presents with R knee swelling, decreased soft  tissue mobility, hamstring tightness, altered gait pattern and posture, weakness, and difficulty performing functional tasks such as walking and sit <> stand transfers. Patient will benefit from skilled physical therapy services to address the aforementioned deficits.    History and Personal Factors relevant to plan of care: S/P R TKA on 10/23/2016, difficulty walking, performing sit <> stand transfers, standing tasks at home.    Clinical Presentation Stable   Clinical Presentation due to: participated in home health PT and did well   Clinical Decision Making Low   Rehab Potential Good   Clinical Impairments Affecting Rehab Potential pain, swelling, healing time   PT Frequency 2x / week   PT Duration 6 weeks   PT Treatment/Interventions Gait training;Neuromuscular re-education;Therapeutic exercise;Therapeutic activities;Patient/family education;Electrical Stimulation;Manual techniques;Iontophoresis 4mg /ml Dexamethasone   PT Next Visit Plan ROM, manual therapy PRN, gait, gentle strengthening when appropriate   Consulted and Agree with Plan of Care Patient      Patient will benefit from skilled therapeutic intervention in order to improve the following deficits and impairments:  Pain, Abnormal gait, Decreased balance, Decreased range of motion, Decreased strength, Difficulty walking  Visit Diagnosis: Right knee pain, unspecified chronicity - Plan: PT plan of care cert/re-cert  Muscle weakness (generalized) - Plan: PT plan of care cert/re-cert  Difficulty in walking, not elsewhere classified - Plan: PT plan of care cert/re-cert      G-Codes - 81/77/11 1339    Functional Assessment Tool Used (Outpatient Only) Clinical presentation, patient interview   Functional Limitation Mobility: Walking and moving around   Mobility: Walking and Moving Around Current Status (A5790) At least 60 percent but less than 80 percent impaired, limited or restricted   Mobility: Walking and Moving Around Goal  Status (X8333) At least 20 percent but less than 40  percent impaired, limited or restricted       Problem List Patient Active Problem List   Diagnosis Date Noted  . Pneumonia 07/19/2016  . BPH without urinary obstruction 06/03/2015  . Depression 03/05/2015  . Hyperlipidemia 09/03/2014  . Major depressive disorder, recurrent episode, moderate ([place]) 09/03/2014  . Chronic low back pain 09/03/2014  . Arthralgia of both knees 09/03/2014  . Type 2 diabetes mellitus without complication ([place]) 85/63/1497  . Allergic rhinitis 07/04/2014  . Anxiety 07/04/2014  . Continuous opioid dependence ([place]) 07/04/2014  . DDD (degenerative disc disease), lumbar 07/04/2014  . Gout of foot 07/04/2014  . BP (high blood pressure) 07/04/2014  . Flu vaccine need 07/04/2014  . Adiposity 07/04/2014  . OA (osteoarthritis) of knee 07/04/2014  . Neuralgia neuritis, sciatic nerve 07/04/2014  . Arthralgia of multiple joints 07/04/2014  . H/O malignant neoplasm of skin 12/24/2013   Thank you for your referral.  Joneen Boers PT, DPT   11/07/2016, 2:06 PM  [facility] [address] [place], [address] Phone: [phone]   Fax:  [phone]  Name: Evan Rivas MRN: [id number] Date of Birth: 1950-04-18

## 2016-11-07 NOTE — Patient Instructions (Signed)
  Supine knee extension stretch   Lying on your back, prop your right leg over a pillow to comfortably stretch your right knee into extension   Perform for 2 minutes at a time throughout the day.

## 2016-11-09 ENCOUNTER — Ambulatory Visit: Payer: Medicare PPO

## 2016-11-09 DIAGNOSIS — R262 Difficulty in walking, not elsewhere classified: Secondary | ICD-10-CM | POA: Diagnosis not present

## 2016-11-09 DIAGNOSIS — M6281 Muscle weakness (generalized): Secondary | ICD-10-CM | POA: Diagnosis not present

## 2016-11-09 DIAGNOSIS — M25561 Pain in right knee: Secondary | ICD-10-CM

## 2016-11-09 NOTE — Therapy (Signed)
[facility] [address], [place], [address] Phone: [phone]   Fax:  [phone]  Physical Therapy Treatment  Patient Details  Name: Evan Rivas MRN: [id number] Date of Birth: 1951/01/08 Referring Provider: Gaynelle Arabian, MD  Encounter Date: 11/09/2016      PT End of Session - 11/09/16 1348    Visit Number 2   Number of Visits 13   Date for PT Re-Evaluation 12/21/16   Authorization Type 2   Authorization Time Period of 10 g-code   PT Start Time 1348   PT Stop Time 1427   PT Time Calculation (min) 39 min   Activity Tolerance Patient tolerated treatment well   Behavior During Therapy [facility] for tasks assessed/performed      Past Medical History:  Diagnosis Date  . Anxiety    panic attacks  . Arthritis   . Cancer (HCC)    lip  . Depression   . Diabetes mellitus without complication ([place])    type 2  . GERD (gastroesophageal reflux disease)   . Gout   . History of kidney stones   . Hyperlipidemia   . Hypertension   . Pneumonia    2014 and 2018 ( 2018-klebsiella pneumonia     Past Surgical History:  Procedure Laterality Date  . ARTHROSCOPIC REPAIR ACL Left   . CYSTOSCOPY W/ URETERAL STENT PLACEMENT Right 09/07/2014   Procedure: CYSTOSCOPY WITH STENT REPLACEMENT;  Surgeon: Hollice Espy, MD;  Location: [facility];  Service: Urology;  Laterality: Right;  . CYSTOSCOPY/URETEROSCOPY/HOLMIUM LASER/STENT PLACEMENT Right 08/17/2014   Procedure: CYSTOSCOPY/URETEROSCOPY//STENT PLACEMENT/ attempt of lithotripsy;  Surgeon: Hollice Espy, MD;  Location: [facility];  Service: Urology;  Laterality: Right;  . HERNIA REPAIR    . MOHS SURGERY     lip  . TOTAL KNEE ARTHROPLASTY Left 04/24/2016   Procedure: LEFT TOTAL KNEE ARTHROPLASTY;  Surgeon: Gaynelle Arabian, MD;  Location: [facility];  Service: Orthopedics;  Laterality: Left;  Adductor Block  . TOTAL KNEE ARTHROPLASTY Right 10/23/2016   Procedure: RIGHT TOTAL  KNEE ARTHROPLASTY;  Surgeon: Gaynelle Arabian, MD;  Location: [facility];  Service: Orthopedics;  Laterality: Right;  . URETEROSCOPY WITH HOLMIUM LASER LITHOTRIPSY Right 09/07/2014   Procedure: URETEROSCOPY WITH HOLMIUM LASER LITHOTRIPSY;  Surgeon: Hollice Espy, MD;  Location: [facility];  Service: Urology;  Laterality: Right;  [name] VASECTOMY      There were no vitals filed for this visit.      Subjective Assessment - 11/09/16 1350    Subjective R knee is ok. Not too bad I guess. 3/10 currently.   Pertinent History S/P R TKA on 10/23/2016.  Pt states that his blood pressure is controlled. Had home health PT afterwards which included R knee ROM, hip exercises, and gait. R knee feels a little bit sore currently.    Patient Stated Goals Get it to the point where his L knee is (function). Be able to walk without hurting, play golf, go to the grocery store without looking for a place to sit down.    Currently in Pain? Yes   Pain Score 3    Pain Onset 1 to 4 weeks ago                                 PT Education - 11/09/16 1413    Education provided Yes   Education Details ther-ex, HEP   Person(s) Educated Patient  Methods Explanation;Demonstration;Tactile cues;Verbal cues   Comprehension Returned demonstration;Verbalized understanding        Objective measurements completed on examination: See above findings.   S/P 2 weeks   Manual therapy   STM R posterior hamstrings in supine, knee in extension, leg propped on pillow      There-ex  Supine quad set R LE 10x3 with 5 second holds  - 17 degrees  Supine R knee flexion/extension AAROM with ball 10x  Standing TKE R knee with bilateral UE assist 10x5 second holds for 2 sets  -14 degrees seated R knee extension AROM afterwards  Seated R knee flexion PROM 10x3  Then with AAROM 10x3    83 degrees knee flexion  Improved exercise technique, movement at target joints, use of target muscles after min to mod  verbal, visual, tactile cues.    Improved seated  R knee extension and flexion AROM after manual therapy and exercise. Pt tolerated session well without aggravation of symptoms.          PT Long Term Goals - 11/07/16 1332      PT LONG TERM GOAL #1   Title Patient will improve R knee extension AROM to -10 degrees or less to promote ability to ambulate and perform standing tasks.    Baseline Seated R knee extension AROM -17 degrees (11/07/2016)   Time 6   Period Weeks   Status New   Target Date 12/21/16     PT LONG TERM GOAL #2   Title Patient will improve R knee flexion seated AROM to at least 115 degrees to promote ability to ambulate, perform sit <> stand transfers, negotiate stairs, perform standing tasks.    Baseline seated R knee flexion AROM 76 degrees (11/07/2016)   Time 6   Period Weeks   Status New   Target Date 12/21/16     PT LONG TERM GOAL #3   Title Patient will be able to ambulate at least 500 ft without use of AD to promote mobility.    Baseline Pt currently ambulates with a rolling walker (11/07/2016)   Time 6   Period Weeks   Status New   Target Date 12/21/16     PT LONG TERM GOAL #4   Title Pt will demonstrate at least 4+/5 R knee flexion and 5/5 R knee extension strength to promote ability to perform standing tasks.    Baseline R knee flexion and extension strength not yet tested secondary to pt being only 2 weeks since surgery to allow for more healing (11/07/2016)   Time 6   Period Weeks   Status New   Target Date 12/21/16               Plan - 11/09/16 1413    Clinical Impression Statement Improved seated  R knee extension and flexion AROM after manual therapy and exercise. Pt tolerated session well without aggravation of symptoms.    History and Personal Factors relevant to plan of care: S/P R TKA on 10/23/2016, difficulty walking, performing sit <> stand transfers, standing tasks at home   Clinical Presentation Stable   Clinical Presentation due  to: improved R knee flexion and extension AROM after session   Clinical Decision Making Low   Rehab Potential Good   Clinical Impairments Affecting Rehab Potential pain, swelling, healing time   PT Frequency 2x / week   PT Duration 6 weeks   PT Treatment/Interventions Gait training;Neuromuscular re-education;Therapeutic exercise;Therapeutic activities;Patient/family education;Electrical Stimulation;Manual techniques;Iontophoresis 4mg /ml Dexamethasone   PT  Next Visit Plan ROM, manual therapy PRN, gait, gentle strengthening when appropriate   Consulted and Agree with Plan of Care Patient      Patient will benefit from skilled therapeutic intervention in order to improve the following deficits and impairments:  Pain, Abnormal gait, Decreased balance, Decreased range of motion, Decreased strength, Difficulty walking  Visit Diagnosis: Difficulty in walking, not elsewhere classified  Muscle weakness (generalized)  Right knee pain, unspecified chronicity     Problem List Patient Active Problem List   Diagnosis Date Noted  . Pneumonia 07/19/2016  . BPH without urinary obstruction 06/03/2015  . Depression 03/05/2015  . Hyperlipidemia 09/03/2014  . Major depressive disorder, recurrent episode, moderate ([place]) 09/03/2014  . Chronic low back pain 09/03/2014  . Arthralgia of both knees 09/03/2014  . Type 2 diabetes mellitus without complication ([place]) 40/97/3532  . Allergic rhinitis 07/04/2014  . Anxiety 07/04/2014  . Continuous opioid dependence ([place]) 07/04/2014  . DDD (degenerative disc disease), lumbar 07/04/2014  . Gout of foot 07/04/2014  . BP (high blood pressure) 07/04/2014  . Flu vaccine need 07/04/2014  . Adiposity 07/04/2014  . OA (osteoarthritis) of knee 07/04/2014  . Neuralgia neuritis, sciatic nerve 07/04/2014  . Arthralgia of multiple joints 07/04/2014  . H/O malignant neoplasm of skin 12/24/2013    Joneen Boers PT, DPT   11/09/2016, 6:45 PM  [facility] [address], [place], [address] Phone: [phone]   Fax:  [phone]  Name: Evan Rivas MRN: [id number] Date of Birth: [birth year]

## 2016-11-09 NOTE — Patient Instructions (Signed)
Gave standing TKE with bilateral UE assist as part of his HEP. Pt demonstrated and verbalized understanding.

## 2016-11-10 ENCOUNTER — Telehealth: Payer: Self-pay | Admitting: Family Medicine

## 2016-11-10 NOTE — Telephone Encounter (Signed)
Patient is needing a refill on hydrocodone 10-325mg .

## 2016-11-13 ENCOUNTER — Ambulatory Visit: Payer: Medicare PPO

## 2016-11-13 DIAGNOSIS — R262 Difficulty in walking, not elsewhere classified: Secondary | ICD-10-CM

## 2016-11-13 DIAGNOSIS — M6281 Muscle weakness (generalized): Secondary | ICD-10-CM | POA: Diagnosis not present

## 2016-11-13 DIAGNOSIS — M25561 Pain in right knee: Secondary | ICD-10-CM

## 2016-11-13 NOTE — Therapy (Signed)
[facility] [address] [place], [address] Phone: [phone]   Fax:  [phone]  Physical Therapy Treatment  Patient Details  Name: Evan Rivas MRN: [id number] Date of Birth: 1950/05/07 Referring Provider: Gaynelle Arabian, MD  Encounter Date: 11/13/2016      PT End of Session - 11/13/16 0814    Visit Number 3   Number of Visits 13   Date for PT Re-Evaluation 12/21/16   Authorization Type 3   Authorization Time Period of 10 g-code   PT Start Time 0814   PT Stop Time 0902   PT Time Calculation (min) 48 min   Activity Tolerance Patient tolerated treatment well   Behavior During Therapy [facility] for tasks assessed/performed      Past Medical History:  Diagnosis Date  . Anxiety    panic attacks  . Arthritis   . Cancer (HCC)    lip  . Depression   . Diabetes mellitus without complication ([place])    type 2  . GERD (gastroesophageal reflux disease)   . Gout   . History of kidney stones   . Hyperlipidemia   . Hypertension   . Pneumonia    2014 and 2018 ( 2018-klebsiella pneumonia     Past Surgical History:  Procedure Laterality Date  . ARTHROSCOPIC REPAIR ACL Left   . CYSTOSCOPY W/ URETERAL STENT PLACEMENT Right 09/07/2014   Procedure: CYSTOSCOPY WITH STENT REPLACEMENT;  Surgeon: Hollice Espy, MD;  Location: [facility];  Service: Urology;  Laterality: Right;  . CYSTOSCOPY/URETEROSCOPY/HOLMIUM LASER/STENT PLACEMENT Right 08/17/2014   Procedure: CYSTOSCOPY/URETEROSCOPY//STENT PLACEMENT/ attempt of lithotripsy;  Surgeon: Hollice Espy, MD;  Location: [facility];  Service: Urology;  Laterality: Right;  . HERNIA REPAIR    . MOHS SURGERY     lip  . TOTAL KNEE ARTHROPLASTY Left 04/24/2016   Procedure: LEFT TOTAL KNEE ARTHROPLASTY;  Surgeon: Gaynelle Arabian, MD;  Location: [facility];  Service: Orthopedics;  Laterality: Left;  Adductor Block  . TOTAL KNEE ARTHROPLASTY Right 10/23/2016   Procedure: RIGHT TOTAL  KNEE ARTHROPLASTY;  Surgeon: Gaynelle Arabian, MD;  Location: [facility];  Service: Orthopedics;  Laterality: Right;  . URETEROSCOPY WITH HOLMIUM LASER LITHOTRIPSY Right 09/07/2014   Procedure: URETEROSCOPY WITH HOLMIUM LASER LITHOTRIPSY;  Surgeon: Hollice Espy, MD;  Location: [facility];  Service: Urology;  Laterality: Right;  [name] VASECTOMY      There were no vitals filed for this visit.      Subjective Assessment - 11/13/16 0815    Subjective R knee is a little sore today. Pt stood 3/4ths of the time he was taking a shower. Did some exercises too. 3/10 R knee currently.    Pertinent History S/P R TKA on 10/23/2016.  Pt states that his blood pressure is controlled. Had home health PT afterwards which included R knee ROM, hip exercises, and gait. R knee feels a little bit sore currently.    Patient Stated Goals Get it to the point where his L knee is (function). Be able to walk without hurting, play golf, go to the grocery store without looking for a place to sit down.    Currently in Pain? Yes   Pain Score 3    Pain Onset 1 to 4 weeks ago                                 PT Education - 11/13/16 0830  Education provided Yes   Education Details ther-ex   Person(s) Educated Patient   Methods Explanation;Demonstration;Tactile cues;Verbal cues   Comprehension Returned demonstration;Verbalized understanding        Objectives  S/P 3 weeks  Seated R knee AROM: -17 degrees extension, 87 degrees flexion  Manual therapy   STM R posterior hamstrings in supine, knee in extension, leg propped on pillow    There-ex  Supine quad set R LE 10x2 with 5 second holds             SLR R hip flexion 5x   Seated R knee flexion PROM 10x3             Then with AAROM 10x3   90 degrees seated R knee flexion AROM  Seated R knee extension LAQ 10x5 seconds for 2 sets  Standing TKE R knee with bilateral UE assist 10x5 second holds for 2 sets  -16 degrees seated R knee  extension AROM  Gait with rw with emphasis on R knee flexion during swing phase and R knee extension during stance phase 80 ft   Standing ankle DF/PF using rockerboard with bilateral UE assist x 2 min to promote knee extension ROM and gastroc muscle flexibility.      Improved exercise technique, movement at target joints, use of target muscles after min to mod verbal, visual, tactile cues.   Improved seated R knee flexion AROM to 90 degrees following PROM and AAROM in sitting. Seated R knee extension AROM back to baseline. Continued working on R knee flexion and extension AROM and gentle strengthening to promote ability to ambulate and perform functional tasks at home. Pt tolerated session well without aggravation of symptoms.           PT Long Term Goals - 11/07/16 1332      PT LONG TERM GOAL #1   Title Patient will improve R knee extension AROM to -10 degrees or less to promote ability to ambulate and perform standing tasks.    Baseline Seated R knee extension AROM -17 degrees (11/07/2016)   Time 6   Period Weeks   Status New   Target Date 12/21/16     PT LONG TERM GOAL #2   Title Patient will improve R knee flexion seated AROM to at least 115 degrees to promote ability to ambulate, perform sit <> stand transfers, negotiate stairs, perform standing tasks.    Baseline seated R knee flexion AROM 76 degrees (11/07/2016)   Time 6   Period Weeks   Status New   Target Date 12/21/16     PT LONG TERM GOAL #3   Title Patient will be able to ambulate at least 500 ft without use of AD to promote mobility.    Baseline Pt currently ambulates with a rolling walker (11/07/2016)   Time 6   Period Weeks   Status New   Target Date 12/21/16     PT LONG TERM GOAL #4   Title Pt will demonstrate at least 4+/5 R knee flexion and 5/5 R knee extension strength to promote ability to perform standing tasks.    Baseline R knee flexion and extension strength not yet tested secondary to pt being only 2  weeks since surgery to allow for more healing (11/07/2016)   Time 6   Period Weeks   Status New   Target Date 12/21/16               Plan - 11/13/16 0810    Clinical  Impression Statement Improved seated R knee flexion AROM to 90 degrees following PROM and AAROM in sitting. Seated R knee extension AROM back to baseline. Continued working on R knee flexion and extension AROM and gentle strengthening to promote ability to ambulate and perform functional tasks at home. Pt tolerated session well without aggravation of symptoms.    History and Personal Factors relevant to plan of care: S/P R TKA on 10/23/2016, difficulty walking, performing sit <> stand transfers, standing tasks at home   Clinical Presentation Stable   Clinical Presentation due to: improved seated R knee flexion AROM   Clinical Decision Making Low   Rehab Potential Good   Clinical Impairments Affecting Rehab Potential pain, swelling, healing time   PT Frequency 2x / week   PT Duration 6 weeks   PT Treatment/Interventions Gait training;Neuromuscular re-education;Therapeutic exercise;Therapeutic activities;Patient/family education;Electrical Stimulation;Manual techniques;Iontophoresis 4mg /ml Dexamethasone   PT Next Visit Plan ROM, manual therapy PRN, gait, gentle strengthening when appropriate   Consulted and Agree with Plan of Care Patient      Patient will benefit from skilled therapeutic intervention in order to improve the following deficits and impairments:  Pain, Abnormal gait, Decreased balance, Decreased range of motion, Decreased strength, Difficulty walking  Visit Diagnosis: Right knee pain, unspecified chronicity  Difficulty in walking, not elsewhere classified  Muscle weakness (generalized)     Problem List Patient Active Problem List   Diagnosis Date Noted  . Pneumonia 07/19/2016  . BPH without urinary obstruction 06/03/2015  . Depression 03/05/2015  . Hyperlipidemia 09/03/2014  . Major depressive  disorder, recurrent episode, moderate ([place]) 09/03/2014  . Chronic low back pain 09/03/2014  . Arthralgia of both knees 09/03/2014  . Type 2 diabetes mellitus without complication ([place]) 54/27/0623  . Allergic rhinitis 07/04/2014  . Anxiety 07/04/2014  . Continuous opioid dependence ([place]) 07/04/2014  . DDD (degenerative disc disease), lumbar 07/04/2014  . Gout of foot 07/04/2014  . BP (high blood pressure) 07/04/2014  . Flu vaccine need 07/04/2014  . Adiposity 07/04/2014  . OA (osteoarthritis) of knee 07/04/2014  . Neuralgia neuritis, sciatic nerve 07/04/2014  . Arthralgia of multiple joints 07/04/2014  . H/O malignant neoplasm of skin 12/24/2013    Joneen Boers PT, DPT   11/13/2016, 1:47 PM  [facility] [address], [place], [address] Phone: [phone]   Fax:  [phone]  Name: Evan Rivas MRN: [id number] Date of Birth: 07-22-1950

## 2016-11-13 NOTE — Telephone Encounter (Signed)
Pt has been notified that he will need to schedule medication refill appointment

## 2016-11-16 ENCOUNTER — Ambulatory Visit: Payer: Medicare PPO

## 2016-11-16 ENCOUNTER — Other Ambulatory Visit: Payer: Self-pay | Admitting: Family Medicine

## 2016-11-16 DIAGNOSIS — M5136 Other intervertebral disc degeneration, lumbar region: Secondary | ICD-10-CM

## 2016-11-16 DIAGNOSIS — M170 Bilateral primary osteoarthritis of knee: Secondary | ICD-10-CM

## 2016-11-16 DIAGNOSIS — M25561 Pain in right knee: Secondary | ICD-10-CM

## 2016-11-16 DIAGNOSIS — R262 Difficulty in walking, not elsewhere classified: Secondary | ICD-10-CM

## 2016-11-16 DIAGNOSIS — M6281 Muscle weakness (generalized): Secondary | ICD-10-CM

## 2016-11-16 MED ORDER — HYDROCODONE-ACETAMINOPHEN 10-325 MG PO TABS: 1.0000 | ORAL_TABLET | Freq: Three times a day (TID) | ORAL | 0 refills | Status: DC | PRN

## 2016-11-16 NOTE — Telephone Encounter (Signed)
Pt requesting refill on his hydrocodone. When I informed him that he would have to be seen per [facility]. He stated that he already have appt for September and he does not understand why he has to come back in. That he comes in every 3 months faithfully and does not miss any of his appointments. I told him that I was not certain that I was just reading what was written, however I will sent another message. Pt then stated that he has to be at another appt and when he finishes there he will come by here and it better be ready. I told him that I would pass the message along.

## 2016-11-16 NOTE — Therapy (Signed)
[facility] [address] [place], [address] Phone: [phone]   Fax:  [phone]  Physical Therapy Treatment  Patient Details  Name: Evan Rivas MRN: [id number] Date of Birth: 08-28-1950 Referring Provider: Gaynelle Arabian, MD  Encounter Date: 11/16/2016      PT End of Session - 11/16/16 0947    Visit Number 4   Number of Visits 13   Date for PT Re-Evaluation 12/21/16   Authorization Type 4   Authorization Time Period of 10 g-code   PT Start Time 0947   PT Stop Time 1032   PT Time Calculation (min) 45 min   Activity Tolerance Patient tolerated treatment well   Behavior During Therapy [facility] for tasks assessed/performed      Past Medical History:  Diagnosis Date  . Anxiety    panic attacks  . Arthritis   . Cancer (HCC)    lip  . Depression   . Diabetes mellitus without complication ([place])    type 2  . GERD (gastroesophageal reflux disease)   . Gout   . History of kidney stones   . Hyperlipidemia   . Hypertension   . Pneumonia    2014 and 2018 ( 2018-klebsiella pneumonia     Past Surgical History:  Procedure Laterality Date  . ARTHROSCOPIC REPAIR ACL Left   . CYSTOSCOPY W/ URETERAL STENT PLACEMENT Right 09/07/2014   Procedure: CYSTOSCOPY WITH STENT REPLACEMENT;  Surgeon: Hollice Espy, MD;  Location: [facility];  Service: Urology;  Laterality: Right;  . CYSTOSCOPY/URETEROSCOPY/HOLMIUM LASER/STENT PLACEMENT Right 08/17/2014   Procedure: CYSTOSCOPY/URETEROSCOPY//STENT PLACEMENT/ attempt of lithotripsy;  Surgeon: Hollice Espy, MD;  Location: [facility];  Service: Urology;  Laterality: Right;  . HERNIA REPAIR    . MOHS SURGERY     lip  . TOTAL KNEE ARTHROPLASTY Left 04/24/2016   Procedure: LEFT TOTAL KNEE ARTHROPLASTY;  Surgeon: Gaynelle Arabian, MD;  Location: [facility];  Service: Orthopedics;  Laterality: Left;  Adductor Block  . TOTAL KNEE ARTHROPLASTY Right 10/23/2016   Procedure: RIGHT TOTAL  KNEE ARTHROPLASTY;  Surgeon: Gaynelle Arabian, MD;  Location: [facility];  Service: Orthopedics;  Laterality: Right;  . URETEROSCOPY WITH HOLMIUM LASER LITHOTRIPSY Right 09/07/2014   Procedure: URETEROSCOPY WITH HOLMIUM LASER LITHOTRIPSY;  Surgeon: Hollice Espy, MD;  Location: [facility];  Service: Urology;  Laterality: Right;  [name] VASECTOMY      There were no vitals filed for this visit.      Subjective Assessment - 11/16/16 0951    Subjective R knee feels more loose (flexible), 2/10 currently. Better able to get into and out of his car.   Pertinent History S/P R TKA on 10/23/2016.  Pt states that his blood pressure is controlled. Had home health PT afterwards which included R knee ROM, hip exercises, and gait. R knee feels a little bit sore currently.    Patient Stated Goals Get it to the point where his L knee is (function). Be able to walk without hurting, play golf, go to the grocery store without looking for a place to sit down.    Currently in Pain? Yes   Pain Score 2    Pain Onset 1 to 4 weeks ago                                 PT Education - 11/16/16 1008    Education provided Yes   Education Details  ther-ex, HEP   Person(s) Educated Patient   Methods Explanation;Demonstration;Tactile cues;Verbal cues;Handout   Comprehension Returned demonstration;Verbalized understanding        Objectives  S/P 3 weeks  Seated R knee AROM: -16 degrees extension, 88 degrees flexion at start of session   Manual therapy   STM R posterior hamstrings in supine, knee in extension, leg propped on pillow    There-ex   Gait with rw with emphasis on R knee flexion during swing phase and R knee extension during stance phase 80 ft, and throughout session.  Supine quad set R LE 10x2 with 5 second holds following manual therapy to decrease hamstring muscle tension  SLR R hip flexion 5x3   -14 seated R knee extension AROM after manual therapy and aforementioned  exercises  Seated R knee flexion PROM 10x3 Then with AAROM 10x3                        97 degrees seated R knee flexion AROM afterwards   Standing gastroc stretch at stair step 30 seconds x 3  Standing L toe taps onto 1st stair step 10x2 with L UE assist  Reviewed and given as part of his HEP. Pt demonstrated and verbalized understanding.   Improved exercise technique, movement at target joints, use of target muscles after min to mod verbal, visual, tactile cues.     Improved R knee extension AROM after manual therapy to decrease R hamstring muscle tension and exercise to activate quadriceps muscles. Improved R knee flexion AROM after PROM and AAROM knee flexion exercise.        PT Long Term Goals - 11/07/16 1332      PT LONG TERM GOAL #1   Title Patient will improve R knee extension AROM to -10 degrees or less to promote ability to ambulate and perform standing tasks.    Baseline Seated R knee extension AROM -17 degrees (11/07/2016)   Time 6   Period Weeks   Status New   Target Date 12/21/16     PT LONG TERM GOAL #2   Title Patient will improve R knee flexion seated AROM to at least 115 degrees to promote ability to ambulate, perform sit <> stand transfers, negotiate stairs, perform standing tasks.    Baseline seated R knee flexion AROM 76 degrees (11/07/2016)   Time 6   Period Weeks   Status New   Target Date 12/21/16     PT LONG TERM GOAL #3   Title Patient will be able to ambulate at least 500 ft without use of AD to promote mobility.    Baseline Pt currently ambulates with a rolling walker (11/07/2016)   Time 6   Period Weeks   Status New   Target Date 12/21/16     PT LONG TERM GOAL #4   Title Pt will demonstrate at least 4+/5 R knee flexion and 5/5 R knee extension strength to promote ability to perform standing tasks.    Baseline R knee flexion and extension strength not yet tested secondary to pt being only 2 weeks since surgery to allow for more  healing (11/07/2016)   Time 6   Period Weeks   Status New   Target Date 12/21/16               Plan - 11/16/16 1014    Clinical Impression Statement Improved R knee extension AROM after manual therapy to decrease R hamstring muscle tension and exercise to activate  quadriceps muscles. Improved R knee flexion AROM after PROM and AAROM knee flexion exercise.    History and Personal Factors relevant to plan of care: S/P R TKA on 10/23/2016, difficulty walking, performing sit <> stand transfers, standing tasks at home   Clinical Presentation Stable   Clinical Presentation due to: Improving function (such as getting in to and out of his car) per pt subjective reports   Clinical Decision Making Low   Rehab Potential Good   Clinical Impairments Affecting Rehab Potential pain, swelling, healing time   PT Frequency 2x / week   PT Duration 6 weeks   PT Treatment/Interventions Gait training;Neuromuscular re-education;Therapeutic exercise;Therapeutic activities;Patient/family education;Electrical Stimulation;Manual techniques;Iontophoresis 4mg /ml Dexamethasone   PT Next Visit Plan ROM, manual therapy PRN, gait, gentle strengthening when appropriate   Consulted and Agree with Plan of Care Patient      Patient will benefit from skilled therapeutic intervention in order to improve the following deficits and impairments:  Pain, Abnormal gait, Decreased balance, Decreased range of motion, Decreased strength, Difficulty walking  Visit Diagnosis: Difficulty in walking, not elsewhere classified  Muscle weakness (generalized)  Right knee pain, unspecified chronicity     Problem List Patient Active Problem List   Diagnosis Date Noted  . Pneumonia 07/19/2016  . BPH without urinary obstruction 06/03/2015  . Depression 03/05/2015  . Hyperlipidemia 09/03/2014  . Major depressive disorder, recurrent episode, moderate ([place]) 09/03/2014  . Chronic low back pain 09/03/2014  . Arthralgia of both  knees 09/03/2014  . Type 2 diabetes mellitus without complication ([place]) 93/57/0177  . Allergic rhinitis 07/04/2014  . Anxiety 07/04/2014  . Continuous opioid dependence ([place]) 07/04/2014  . DDD (degenerative disc disease), lumbar 07/04/2014  . Gout of foot 07/04/2014  . BP (high blood pressure) 07/04/2014  . Flu vaccine need 07/04/2014  . Adiposity 07/04/2014  . OA (osteoarthritis) of knee 07/04/2014  . Neuralgia neuritis, sciatic nerve 07/04/2014  . Arthralgia of multiple joints 07/04/2014  . H/O malignant neoplasm of skin 12/24/2013    Joneen Boers PT, DPT   11/16/2016, 2:19 PM  [place] [facility] [address] [place], [address] Phone: [phone]   Fax:  [phone]  Name: Evan Rivas MRN: [id number] Date of Birth: 12-11-1950

## 2016-11-16 NOTE — Patient Instructions (Addendum)
  Gave supine SLR R hip extension 5x3 daily as part of his HEP. Pt demonstrated and verbalized understanding.    Standing L toe taps onto 1st stair step 10x3 with L UE assist   Reviewed and given as part of his HEP. Pt demonstrated and verbalized understanding.

## 2016-11-20 ENCOUNTER — Ambulatory Visit: Payer: Medicare PPO

## 2016-11-20 DIAGNOSIS — M25561 Pain in right knee: Secondary | ICD-10-CM | POA: Diagnosis not present

## 2016-11-20 DIAGNOSIS — R262 Difficulty in walking, not elsewhere classified: Secondary | ICD-10-CM

## 2016-11-20 DIAGNOSIS — M6281 Muscle weakness (generalized): Secondary | ICD-10-CM | POA: Diagnosis not present

## 2016-11-20 NOTE — Therapy (Signed)
[place] [facility] [address], [place], [address] Phone: [phone]   Fax:  [phone]  Physical Therapy Treatment  Patient Details  Name: Evan Rivas MRN: [id number] Date of Birth: 04/15/1950 Referring Provider: Gaynelle Arabian, MD  Encounter Date: 11/20/2016      PT End of Session - 11/20/16 0947    Visit Number 5   Number of Visits 13   Date for PT Re-Evaluation 12/21/16   Authorization Type 5   Authorization Time Period of 10 g-code   PT Start Time 0947   PT Stop Time 1030   PT Time Calculation (min) 43 min   Activity Tolerance Patient tolerated treatment well   Behavior During Therapy [facility] for tasks assessed/performed      Past Medical History:  Diagnosis Date  . Anxiety    panic attacks  . Arthritis   . Cancer (HCC)    lip  . Depression   . Diabetes mellitus without complication ([place])    type 2  . GERD (gastroesophageal reflux disease)   . Gout   . History of kidney stones   . Hyperlipidemia   . Hypertension   . Pneumonia    2014 and 2018 ( 2018-klebsiella pneumonia     Past Surgical History:  Procedure Laterality Date  . ARTHROSCOPIC REPAIR ACL Left   . CYSTOSCOPY W/ URETERAL STENT PLACEMENT Right 09/07/2014   Procedure: CYSTOSCOPY WITH STENT REPLACEMENT;  Surgeon: Hollice Espy, MD;  Location: [facility];  Service: Urology;  Laterality: Right;  . CYSTOSCOPY/URETEROSCOPY/HOLMIUM LASER/STENT PLACEMENT Right 08/17/2014   Procedure: CYSTOSCOPY/URETEROSCOPY//STENT PLACEMENT/ attempt of lithotripsy;  Surgeon: Hollice Espy, MD;  Location: [facility];  Service: Urology;  Laterality: Right;  . HERNIA REPAIR    . MOHS SURGERY     lip  . TOTAL KNEE ARTHROPLASTY Left 04/24/2016   Procedure: LEFT TOTAL KNEE ARTHROPLASTY;  Surgeon: Gaynelle Arabian, MD;  Location: [facility];  Service: Orthopedics;  Laterality: Left;  Adductor Block  . TOTAL KNEE ARTHROPLASTY Right 10/23/2016   Procedure: RIGHT TOTAL  KNEE ARTHROPLASTY;  Surgeon: Gaynelle Arabian, MD;  Location: [facility];  Service: Orthopedics;  Laterality: Right;  . URETEROSCOPY WITH HOLMIUM LASER LITHOTRIPSY Right 09/07/2014   Procedure: URETEROSCOPY WITH HOLMIUM LASER LITHOTRIPSY;  Surgeon: Hollice Espy, MD;  Location: [facility];  Service: Urology;  Laterality: Right;  [name] VASECTOMY      There were no vitals filed for this visit.      Subjective Assessment - 11/20/16 0949    Subjective R knee feels pretty good. Not really having any pain to speak of (at rest). Better able to get up from a commode and stopped using his raised toilet seat.   Pertinent History S/P R TKA on 10/23/2016.  Pt states that his blood pressure is controlled. Had home health PT afterwards which included R knee ROM, hip exercises, and gait. R knee feels a little bit sore currently.    Patient Stated Goals Get it to the point where his L knee is (function). Be able to walk without hurting, play golf, go to the grocery store without looking for a place to sit down.    Currently in Pain? No/denies   Pain Score 0-No pain   Pain Onset 1 to 4 weeks ago                                 PT Education -  11/20/16 0950    Education provided Yes   Education Details ther-ex   [employer]) Educated Patient   Methods Explanation;Demonstration;Tactile cues;Verbal cues   Comprehension Returned demonstration;Verbalized understanding         Objectives  S/P 4weeks  Seated R knee AROM: -17 degrees extension, 94 degrees flexion at start of session   Manual therapy   STM R posterior hamstrings in supine, knee in extension, leg propped on pillow    There-ex   Supine quad set R LE 10xwith 5 second holds following manual therapy to decrease hamstring muscle tension  SLR R hip flexion 5x3. No quad lag observed.   Seated R knee extension AROM -12 degrees after aforementioned exercises and STM to hamstrings   Seated R knee flexion PROM  10x3 Then with AAROM 10x3 105 degrees seated R knee flexion AROM afterwards   Gait with SPC on L, 2 point gait pattern 70 ft and throughout session, CGA to SBA         Standing gastroc stretch at stair step 30 seconds x 3   Standing L toe taps onto 1st stair step 10x3 with L UE assist  Forward step up onto Air Ex pad with R LE with one UE assist 10x   Improved exercise technique, movement at target joints, use of target muscles after min to mod verbal, visual, tactile cues.   Improving seated R knee extension and flexion AROM. No quad lag observed with SLR R hip flexion exercise. Able to ambulate with SPC assist on L side without LOB. Pt tolerated session well without aggravation of symptoms.             PT Long Term Goals - 11/07/16 1332      PT LONG TERM GOAL #1   Title Patient will improve R knee extension AROM to -10 degrees or less to promote ability to ambulate and perform standing tasks.    Baseline Seated R knee extension AROM -17 degrees (11/07/2016)   Time 6   Period Weeks   Status New   Target Date 12/21/16     PT LONG TERM GOAL #2   Title Patient will improve R knee flexion seated AROM to at least 115 degrees to promote ability to ambulate, perform sit <> stand transfers, negotiate stairs, perform standing tasks.    Baseline seated R knee flexion AROM 76 degrees (11/07/2016)   Time 6   Period Weeks   Status New   Target Date 12/21/16     PT LONG TERM GOAL #3   Title Patient will be able to ambulate at least 500 ft without use of AD to promote mobility.    Baseline Pt currently ambulates with a rolling walker (11/07/2016)   Time 6   Period Weeks   Status New   Target Date 12/21/16     PT LONG TERM GOAL #4   Title Pt will demonstrate at least 4+/5 R knee flexion and 5/5 R knee extension strength to promote ability to perform standing tasks.    Baseline R knee flexion and extension strength not yet tested secondary to pt  being only 2 weeks since surgery to allow for more healing (11/07/2016)   Time 6   Period Weeks   Status New   Target Date 12/21/16               Plan - 11/20/16 1004    Clinical Impression Statement Improving seated R knee extension and flexion AROM. No quad lag observed with  SLR R hip flexion exercise. Able to ambulate with SPC assist on L side without LOB. Pt tolerated session well without aggravation of symptoms.    History and Personal Factors relevant to plan of care: S/P R TKA on 10/23/2016, difficulty walking, performing sit <> stand transfers, standing tasks at home   Clinical Presentation Stable   Clinical Presentation due to: improved ability to get up from a commode without raised toilet seat per subjective reports   Clinical Decision Making Low   Rehab Potential Good   Clinical Impairments Affecting Rehab Potential pain, swelling, healing time   PT Frequency 2x / week   PT Duration 6 weeks   PT Treatment/Interventions Gait training;Neuromuscular re-education;Therapeutic exercise;Therapeutic activities;Patient/family education;Electrical Stimulation;Manual techniques;Iontophoresis 4mg /ml Dexamethasone   PT Next Visit Plan ROM, manual therapy PRN, gait, gentle strengthening when appropriate   Consulted and Agree with Plan of Care Patient      Patient will benefit from skilled therapeutic intervention in order to improve the following deficits and impairments:  Pain, Abnormal gait, Decreased balance, Decreased range of motion, Decreased strength, Difficulty walking  Visit Diagnosis: Difficulty in walking, not elsewhere classified  Muscle weakness (generalized)  Right knee pain, unspecified chronicity     Problem List Patient Active Problem List   Diagnosis Date Noted  . Pneumonia 07/19/2016  . BPH without urinary obstruction 06/03/2015  . Depression 03/05/2015  . Hyperlipidemia 09/03/2014  . Major depressive disorder, recurrent episode, moderate ([place])  09/03/2014  . Chronic low back pain 09/03/2014  . Arthralgia of both knees 09/03/2014  . Type 2 diabetes mellitus without complication ([place]) 48/04/6551  . Allergic rhinitis 07/04/2014  . Anxiety 07/04/2014  . Continuous opioid dependence ([place]) 07/04/2014  . DDD (degenerative disc disease), lumbar 07/04/2014  . Gout of foot 07/04/2014  . BP (high blood pressure) 07/04/2014  . Flu vaccine need 07/04/2014  . Adiposity 07/04/2014  . OA (osteoarthritis) of knee 07/04/2014  . Neuralgia neuritis, sciatic nerve 07/04/2014  . Arthralgia of multiple joints 07/04/2014  . H/O malignant neoplasm of skin 12/24/2013   Joneen Boers PT, DPT   11/20/2016, 12:46 PM  [facility] [address] [place], [address] Phone: [phone]   Fax:  [phone]  Name: Evan Rivas MRN: [id number] Date of Birth: [birth year]

## 2016-11-23 ENCOUNTER — Ambulatory Visit: Payer: Medicare PPO

## 2016-11-23 DIAGNOSIS — M25561 Pain in right knee: Secondary | ICD-10-CM | POA: Diagnosis not present

## 2016-11-23 DIAGNOSIS — R262 Difficulty in walking, not elsewhere classified: Secondary | ICD-10-CM

## 2016-11-23 DIAGNOSIS — M6281 Muscle weakness (generalized): Secondary | ICD-10-CM | POA: Diagnosis not present

## 2016-11-23 NOTE — Therapy (Signed)
[facility] [address], [place], [address] Phone: [phone]   Fax:  [phone]  Physical Therapy Treatment  Patient Details  Name: Evan Rivas MRN: [id number] Date of Birth: 1951-01-25 Referring Provider: Gaynelle Arabian, MD  Encounter Date: 11/23/2016      PT End of Session - 11/23/16 1650    Visit Number 6   Number of Visits 13   Date for PT Re-Evaluation 12/21/16   Authorization Type 6   Authorization Time Period of 10 g-code   PT Start Time 9983   PT Stop Time 1733   PT Time Calculation (min) 43 min   Activity Tolerance Patient tolerated treatment well   Behavior During Therapy [facility] for tasks assessed/performed      Past Medical History:  Diagnosis Date  . Anxiety    panic attacks  . Arthritis   . Cancer (HCC)    lip  . Depression   . Diabetes mellitus without complication ([place])    type 2  . GERD (gastroesophageal reflux disease)   . Gout   . History of kidney stones   . Hyperlipidemia   . Hypertension   . Pneumonia    2014 and 2018 ( 2018-klebsiella pneumonia     Past Surgical History:  Procedure Laterality Date  . ARTHROSCOPIC REPAIR ACL Left   . CYSTOSCOPY W/ URETERAL STENT PLACEMENT Right 09/07/2014   Procedure: CYSTOSCOPY WITH STENT REPLACEMENT;  Surgeon: Hollice Espy, MD;  Location: [facility];  Service: Urology;  Laterality: Right;  . CYSTOSCOPY/URETEROSCOPY/HOLMIUM LASER/STENT PLACEMENT Right 08/17/2014   Procedure: CYSTOSCOPY/URETEROSCOPY//STENT PLACEMENT/ attempt of lithotripsy;  Surgeon: Hollice Espy, MD;  Location: [facility];  Service: Urology;  Laterality: Right;  . HERNIA REPAIR    . MOHS SURGERY     lip  . TOTAL KNEE ARTHROPLASTY Left 04/24/2016   Procedure: LEFT TOTAL KNEE ARTHROPLASTY;  Surgeon: Gaynelle Arabian, MD;  Location: [facility];  Service: Orthopedics;  Laterality: Left;  Adductor Block  . TOTAL KNEE ARTHROPLASTY Right 10/23/2016   Procedure: RIGHT TOTAL  KNEE ARTHROPLASTY;  Surgeon: Gaynelle Arabian, MD;  Location: [facility];  Service: Orthopedics;  Laterality: Right;  . URETEROSCOPY WITH HOLMIUM LASER LITHOTRIPSY Right 09/07/2014   Procedure: URETEROSCOPY WITH HOLMIUM LASER LITHOTRIPSY;  Surgeon: Hollice Espy, MD;  Location: [facility];  Service: Urology;  Laterality: Right;  [name] VASECTOMY      There were no vitals filed for this visit.      Subjective Assessment - 11/23/16 1652    Subjective  R knee is doing good. Not really having pain or discomfort to speak of.    Pertinent History S/P R TKA on 10/23/2016.  Pt states that his blood pressure is controlled. Had home health PT afterwards which included R knee ROM, hip exercises, and gait. R knee feels a little bit sore currently.    Patient Stated Goals Get it to the point where his L knee is (function). Be able to walk without hurting, play golf, go to the grocery store without looking for a place to sit down.    Currently in Pain? No/denies   Pain Score 0-No pain   Pain Onset 1 to 4 weeks ago                                 PT Education - 11/23/16 1659    Education provided Yes   Education Details ther-ex  Person(s) Educated Patient   Methods Explanation;Demonstration;Tactile cues;Verbal cues   Comprehension Returned demonstration;Verbalized understanding        Objectives  S/P 4weeks   no abnormal warmth, redness or swelling R leg. No pain with calf squeeze.   seated R knee AROM mid session: -10 degrees to 100 degrees  There-ex   Gait with SPC on L, 2 point gait pattern 100 ft and throughout session, CGA to SBA   Standing bilateral ankle DF/PF on rocker board to promote gastroc flexibility x 2 min.  Forward step up onto Air Ex pad with R LE with one UE assist 10x2  Side stepping 5 ft to the L and 5 ft to the R with bilateral UE assist from treadmill bars 10x  Supine quad set R LE 10xwith 5 second holds following manual therapy to  decrease hamstring muscle tension  SLR R hip flexion 5x3. No quad lag observed.     Seated R knee flexion PROM 10x3 Then with AAROM 10x3  110 seated R knee flexion AROM afterwards   Advance pt to gait with SPC next visit if appropriate   Improved exercise technique, movement at target joints, use of target muscles after min to mod verbal, visual, tactile cues.      Manual therapy   STM R posterior hamstrings in supine, knee in extension, leg propped on pillow   Improving seated R knee flexion and extension AROM consistently. Better able to perform tasks such as tying his shoe per pt reports. Able to ambulate with SPC at least 100 ft and throughout session without LOB.            PT Long Term Goals - 11/07/16 1332      PT LONG TERM GOAL #1   Title Patient will improve R knee extension AROM to -10 degrees or less to promote ability to ambulate and perform standing tasks.    Baseline Seated R knee extension AROM -17 degrees (11/07/2016)   Time 6   Period Weeks   Status New   Target Date 12/21/16     PT LONG TERM GOAL #2   Title Patient will improve R knee flexion seated AROM to at least 115 degrees to promote ability to ambulate, perform sit <> stand transfers, negotiate stairs, perform standing tasks.    Baseline seated R knee flexion AROM 76 degrees (11/07/2016)   Time 6   Period Weeks   Status New   Target Date 12/21/16     PT LONG TERM GOAL #3   Title Patient will be able to ambulate at least 500 ft without use of AD to promote mobility.    Baseline Pt currently ambulates with a rolling walker (11/07/2016)   Time 6   Period Weeks   Status New   Target Date 12/21/16     PT LONG TERM GOAL #4   Title Pt will demonstrate at least 4+/5 R knee flexion and 5/5 R knee extension strength to promote ability to perform standing tasks.    Baseline R knee flexion and extension strength not yet tested secondary to pt being only 2 weeks since surgery to allow  for more healing (11/07/2016)   Time 6   Period Weeks   Status New   Target Date 12/21/16               Plan - 11/23/16 1703    Clinical Impression Statement Improving seated R knee flexion and extension AROM consistently. Better able to perform tasks such  as tying his shoe per pt reports. Able to ambulate with SPC at least 100 ft and throughout session without LOB.     History and Personal Factors relevant to plan of care: S/P R TKA on 10/23/2016, difficulty walking, performing sit <> stand transfers, standing tasks at home   Clinical Presentation Stable   Clinical Presentation due to: improving R knee flexion and extension ROM, able to ambulate with SPC at least 100 ft without LOB.    Clinical Decision Making Low   Rehab Potential Good   Clinical Impairments Affecting Rehab Potential pain, swelling, healing time   PT Frequency 2x / week   PT Duration 6 weeks   PT Treatment/Interventions Gait training;Neuromuscular re-education;Therapeutic exercise;Therapeutic activities;Patient/family education;Electrical Stimulation;Manual techniques;Iontophoresis 4mg /ml Dexamethasone   PT Next Visit Plan ROM, manual therapy PRN, gait, gentle strengthening when appropriate   Consulted and Agree with Plan of Care Patient      Patient will benefit from skilled therapeutic intervention in order to improve the following deficits and impairments:  Pain, Abnormal gait, Decreased balance, Decreased range of motion, Decreased strength, Difficulty walking  Visit Diagnosis: Difficulty in walking, not elsewhere classified  Muscle weakness (generalized)  Right knee pain, unspecified chronicity     Problem List Patient Active Problem List   Diagnosis Date Noted  . Pneumonia 07/19/2016  . BPH without urinary obstruction 06/03/2015  . Depression 03/05/2015  . Hyperlipidemia 09/03/2014  . Major depressive disorder, recurrent episode, moderate ([place]) 09/03/2014  . Chronic low back pain 09/03/2014  .  Arthralgia of both knees 09/03/2014  . Type 2 diabetes mellitus without complication ([place]) 68/06/2120  . Allergic rhinitis 07/04/2014  . Anxiety 07/04/2014  . Continuous opioid dependence ([place]) 07/04/2014  . DDD (degenerative disc disease), lumbar 07/04/2014  . Gout of foot 07/04/2014  . BP (high blood pressure) 07/04/2014  . Flu vaccine need 07/04/2014  . Adiposity 07/04/2014  . OA (osteoarthritis) of knee 07/04/2014  . Neuralgia neuritis, sciatic nerve 07/04/2014  . Arthralgia of multiple joints 07/04/2014  . H/O malignant neoplasm of skin 12/24/2013    Joneen Boers PT, DPT   11/23/2016, 6:45 PM  [facility] [address], [place], [address] Phone: [phone]   Fax:  [phone]  Name: Evan Rivas MRN: [id number] Date of Birth: 1950/11/29

## 2016-11-23 NOTE — Patient Instructions (Signed)
  Gave supine SLR R hip flexion as part of his HEP 5x3 daily. Pt demonstrated and verbalized understanding.

## 2016-11-28 ENCOUNTER — Ambulatory Visit: Payer: Medicare PPO

## 2016-11-28 DIAGNOSIS — Z471 Aftercare following joint replacement surgery: Secondary | ICD-10-CM | POA: Diagnosis not present

## 2016-11-28 DIAGNOSIS — M25561 Pain in right knee: Secondary | ICD-10-CM

## 2016-11-28 DIAGNOSIS — R262 Difficulty in walking, not elsewhere classified: Secondary | ICD-10-CM | POA: Diagnosis not present

## 2016-11-28 DIAGNOSIS — M6281 Muscle weakness (generalized): Secondary | ICD-10-CM | POA: Diagnosis not present

## 2016-11-28 DIAGNOSIS — Z96651 Presence of right artificial knee joint: Secondary | ICD-10-CM | POA: Diagnosis not present

## 2016-11-28 NOTE — Therapy (Signed)
[facility] [address] [place], [address] Phone: [phone]   Fax:  [phone]  Physical Therapy Treatment And Progress Report  Patient Details  Name: Evan Rivas MRN: [id number] Date of Birth: [birth year] Referring Provider: Gaynelle Arabian, MD  Encounter Date: 11/28/2016      PT End of Session - 11/28/16 1425    Visit Number 7   Number of Visits 13   Date for PT Re-Evaluation 12/21/16   Authorization Type 7   Authorization Time Period of 10 g-code   PT Start Time 1425   PT Stop Time 0045   PT Time Calculation (min) 49 min   Equipment Utilized During Treatment Gait belt   Activity Tolerance Patient tolerated treatment well   Behavior During Therapy [facility] for tasks assessed/performed      Past Medical History:  Diagnosis Date  . Anxiety    panic attacks  . Arthritis   . Cancer (HCC)    lip  . Depression   . Diabetes mellitus without complication ([place])    type 2  . GERD (gastroesophageal reflux disease)   . Gout   . History of kidney stones   . Hyperlipidemia   . Hypertension   . Pneumonia    2014 and 2018 ( 2018-klebsiella pneumonia     Past Surgical History:  Procedure Laterality Date  . ARTHROSCOPIC REPAIR ACL Left   . CYSTOSCOPY W/ URETERAL STENT PLACEMENT Right 09/07/2014   Procedure: CYSTOSCOPY WITH STENT REPLACEMENT;  Surgeon: Hollice Espy, MD;  Location: [facility];  Service: Urology;  Laterality: Right;  . CYSTOSCOPY/URETEROSCOPY/HOLMIUM LASER/STENT PLACEMENT Right 08/17/2014   Procedure: CYSTOSCOPY/URETEROSCOPY//STENT PLACEMENT/ attempt of lithotripsy;  Surgeon: Hollice Espy, MD;  Location: [facility];  Service: Urology;  Laterality: Right;  . HERNIA REPAIR    . MOHS SURGERY     lip  . TOTAL KNEE ARTHROPLASTY Left 04/24/2016   Procedure: LEFT TOTAL KNEE ARTHROPLASTY;  Surgeon: Gaynelle Arabian, MD;  Location: [facility];  Service: Orthopedics;  Laterality: Left;  Adductor Block   . TOTAL KNEE ARTHROPLASTY Right 10/23/2016   Procedure: RIGHT TOTAL KNEE ARTHROPLASTY;  Surgeon: Gaynelle Arabian, MD;  Location: [facility];  Service: Orthopedics;  Laterality: Right;  . URETEROSCOPY WITH HOLMIUM LASER LITHOTRIPSY Right 09/07/2014   Procedure: URETEROSCOPY WITH HOLMIUM LASER LITHOTRIPSY;  Surgeon: Hollice Espy, MD;  Location: [facility];  Service: Urology;  Laterality: Right;  [name] VASECTOMY      There were no vitals filed for this visit.      Subjective Assessment - 11/28/16 1431    Subjective Pt states no R knee pain. Putting on his R shoe is easier.  Has a 5:15 pm appointment with his surgeon today.    Pertinent History S/P R TKA on 10/23/2016.  Pt states that his blood pressure is controlled. Had home health PT afterwards which included R knee ROM, hip exercises, and gait. R knee feels a little bit sore currently.    Patient Stated Goals Get it to the point where his L knee is (function). Be able to walk without hurting, play golf, go to the grocery store without looking for a place to sit down.    Currently in Pain? No/denies   Pain Score 0-No pain   Pain Onset 1 to 4 weeks ago            [facility] PT Assessment - 11/28/16 0001      Strength   Right Knee Flexion 4+/5  Right Knee Extension 5/5                             PT Education - 11/28/16 1432    Education provided Yes   Education Details ther-ex   Person(s) Educated Patient   Methods Explanation;Demonstration;Tactile cues;Verbal cues   Comprehension Returned demonstration;Verbalized understanding        Objectives  S/P 5weeks   seated R knee AROM at start of session: -15 degrees to 105 degrees  There-ex   Gait with SPC on L, 2 point gait pattern 220 ft and throughout session, CGA to SBA   Seated manually resisted R knee flexion and extension 1x each way  Forward step up onto 3 inch step with R LE and use of SPC   With step down using SPC (down with R LE)  5x  Stepping off and onto a curb with R LE, using SPC 3x, CGA to SBA. Able to perform without LOB safely.   Standing bilateral ankle DF/PF on rocker board to promote gastroc flexibility x 2 min.  Standing TKE R 10x10 seconds  Seated self muscle energy technique to promote knee extension AROM 5 seconds, 5x2    Muscle energy technique with PT to promote knee extension AROM   -13 degrees seated R knee extension AROM afterwards.   Seated R knee flexion AAROM with PT 10x3  110 degrees seated R knee flexion afterwards.    Improved exercise technique, movement at target joints, use of target muscles after min to mod verbal, visual, tactile cues.     Pt demonstrates overall improved R knee AROM (with seated R knee flexion AROM improving to 105 to 110 degrees), strength, ability to ambulate since initial evaluation. Pt able to ambulate with his [facility] consistently for the past 3 sessions without LOB. Pt still demonstrates weakness, limited R knee AROM, difficulty with gait and performing standing tasks and would benefit from continued skilled physical therapy services to address the aforementioned deficits.            PT Long Term Goals - 11/28/16 1607      PT LONG TERM GOAL #1   Title Patient will improve R knee extension AROM to -10 degrees or less to promote ability to ambulate and perform standing tasks.    Baseline Seated R knee extension AROM -17 degrees (11/07/2016); -15 degrees AROM at start of session (11/28/2016)   Time 6   Period Weeks   Status On-going   Target Date 12/21/16     PT LONG TERM GOAL #2   Title Patient will improve R knee flexion seated AROM to at least 115 degrees to promote ability to ambulate, perform sit <> stand transfers, negotiate stairs, perform standing tasks.    Baseline seated R knee flexion AROM 76 degrees (11/07/2016); 105 degrees seated R knee flexion AROM at start of session (11/28/2016)   Time 6   Period Weeks   Status Partially Met   Target Date  12/21/16     PT LONG TERM GOAL #3   Title Patient will be able to ambulate at least 500 ft without use of AD to promote mobility.    Baseline Pt currently ambulates with a rolling walker (11/07/2016); able to ambulate with SPC safely at least 220 ft (11/28/2016)   Time 6   Period Weeks   Status Partially Met     PT LONG TERM GOAL #4   Title Pt will demonstrate  at least 4+/5 R knee flexion and 5/5 R knee extension strength to promote ability to perform standing tasks.    Baseline R knee flexion and extension strength not yet tested secondary to pt being only 2 weeks since surgery to allow for more healing (11/07/2016);  4+/5 R knee flexion, 5/5 R knee extension (11/28/2016)   Time 6   Period Weeks   Status Achieved               Plan - 11/28/16 1447    Clinical Impression Statement Pt demonstrates overall improved R knee AROM (with seated R knee flexion AROM improving to 105 to 110 degrees), strength, ability to ambulate since initial evaluation. Pt able to ambulate with his [facility] consistently for the past 3 sessions without LOB. Pt still demonstrates weakness, limited R knee AROM, difficulty with gait and performing standing tasks and would benefit from continued skilled physical therapy services to address the aforementioned deficits.    History and Personal Factors relevant to plan of care: S/P R TKA on 10/23/2016, difficulty walking, performing sit <> stand transfers, standing tasks at home   Clinical Presentation Stable   Clinical Presentation due to: able to ambulate and negotiate curbs safely with [facility]   Clinical Decision Making Low   Rehab Potential Good   Clinical Impairments Affecting Rehab Potential pain, swelling, healing time   PT Frequency 2x / week   PT Duration 6 weeks   PT Treatment/Interventions Gait training;Neuromuscular re-education;Therapeutic exercise;Therapeutic activities;Patient/family education;Electrical Stimulation;Manual techniques;Iontophoresis 11m/ml  Dexamethasone   PT Next Visit Plan ROM, manual therapy PRN, gait, gentle strengthening when appropriate   Consulted and Agree with Plan of Care Patient      Patient will benefit from skilled therapeutic intervention in order to improve the following deficits and impairments:  Pain, Abnormal gait, Decreased balance, Decreased range of motion, Decreased strength, Difficulty walking  Visit Diagnosis: Difficulty in walking, not elsewhere classified  Muscle weakness (generalized)  Right knee pain, unspecified chronicity     Problem List Patient Active Problem List   Diagnosis Date Noted  . Pneumonia 07/19/2016  . BPH without urinary obstruction 06/03/2015  . Depression 03/05/2015  . Hyperlipidemia 09/03/2014  . Major depressive disorder, recurrent episode, moderate ([place] 09/03/2014  . Chronic low back pain 09/03/2014  . Arthralgia of both knees 09/03/2014  . Type 2 diabetes mellitus without complication ([place] 001/58/6825 . Allergic rhinitis 07/04/2014  . Anxiety 07/04/2014  . Continuous opioid dependence ([place] 07/04/2014  . DDD (degenerative disc disease), lumbar 07/04/2014  . Gout of foot 07/04/2014  . BP (high blood pressure) 07/04/2014  . Flu vaccine need 07/04/2014  . Adiposity 07/04/2014  . OA (osteoarthritis) of knee 07/04/2014  . Neuralgia neuritis, sciatic nerve 07/04/2014  . Arthralgia of multiple joints 07/04/2014  . H/O malignant neoplasm of skin 12/24/2013    Thank you for your referral.  MJoneen BoersPT, DPT   11/28/2016, 4:21 PM  [facility] [address] [place] [id number]: [phone]  Fax:  [phone] Name: Evan HUDNALLMRN: [id number] of Birth: 806-04-1950

## 2016-11-30 ENCOUNTER — Ambulatory Visit: Payer: Medicare PPO

## 2016-11-30 DIAGNOSIS — M25561 Pain in right knee: Secondary | ICD-10-CM

## 2016-11-30 DIAGNOSIS — M6281 Muscle weakness (generalized): Secondary | ICD-10-CM

## 2016-11-30 DIAGNOSIS — R262 Difficulty in walking, not elsewhere classified: Secondary | ICD-10-CM

## 2016-11-30 NOTE — Therapy (Signed)
[facility] [address], [place], [address] Phone: [phone]   Fax:  [phone]  Physical Therapy Treatment And Discharge Summary  Patient Details  Name: Evan Rivas MRN: [id number] Date of Birth: 16-Jun-1950 Referring Provider: Gaynelle Arabian, MD  Encounter Date: 11/30/2016      PT End of Session - 11/30/16 0900    Visit Number 8   Number of Visits 13   Date for PT Re-Evaluation 12/21/16   Authorization Type 8   Authorization Time Period of 10 g-code   PT Start Time 0900   PT Stop Time 0933   PT Time Calculation (min) 33 min   Equipment Utilized During Treatment Gait belt   Activity Tolerance Patient tolerated treatment well   Behavior During Therapy [facility] for tasks assessed/performed      Past Medical History:  Diagnosis Date  . Anxiety    panic attacks  . Arthritis   . Cancer (HCC)    lip  . Depression   . Diabetes mellitus without complication ([place])    type 2  . GERD (gastroesophageal reflux disease)   . Gout   . History of kidney stones   . Hyperlipidemia   . Hypertension   . Pneumonia    2014 and 2018 ( 2018-klebsiella pneumonia     Past Surgical History:  Procedure Laterality Date  . ARTHROSCOPIC REPAIR ACL Left   . CYSTOSCOPY W/ URETERAL STENT PLACEMENT Right 09/07/2014   Procedure: CYSTOSCOPY WITH STENT REPLACEMENT;  Surgeon: Hollice Espy, MD;  Location: [facility];  Service: Urology;  Laterality: Right;  . CYSTOSCOPY/URETEROSCOPY/HOLMIUM LASER/STENT PLACEMENT Right 08/17/2014   Procedure: CYSTOSCOPY/URETEROSCOPY//STENT PLACEMENT/ attempt of lithotripsy;  Surgeon: Hollice Espy, MD;  Location: [facility];  Service: Urology;  Laterality: Right;  . HERNIA REPAIR    . MOHS SURGERY     lip  . TOTAL KNEE ARTHROPLASTY Left 04/24/2016   Procedure: LEFT TOTAL KNEE ARTHROPLASTY;  Surgeon: Gaynelle Arabian, MD;  Location: [facility];  Service: Orthopedics;  Laterality: Left;  Adductor Block   . TOTAL KNEE ARTHROPLASTY Right 10/23/2016   Procedure: RIGHT TOTAL KNEE ARTHROPLASTY;  Surgeon: Gaynelle Arabian, MD;  Location: [facility];  Service: Orthopedics;  Laterality: Right;  . URETEROSCOPY WITH HOLMIUM LASER LITHOTRIPSY Right 09/07/2014   Procedure: URETEROSCOPY WITH HOLMIUM LASER LITHOTRIPSY;  Surgeon: Hollice Espy, MD;  Location: [facility];  Service: Urology;  Laterality: Right;  [name] VASECTOMY      There were no vitals filed for this visit.      Subjective Assessment - 11/30/16 0901    Subjective Pt states the MD appointment went great. Able to drive now. MD told him to come to PT today then should be good for DC from PT.  No pain in R knee.  Feels confident about graduating PT today.  Did not take pain medication today.  Did not feel like he needed it.    Pertinent History S/P R TKA on 10/23/2016.  Pt states that his blood pressure is controlled. Had home health PT afterwards which included R knee ROM, hip exercises, and gait. R knee feels a little bit sore currently.    Patient Stated Goals Get it to the point where his L knee is (function). Be able to walk without hurting, play golf, go to the grocery store without looking for a place to sit down.    Currently in Pain? No/denies   Pain Score 0-No pain   Pain Onset 1 to 4  weeks ago                                 PT Education - 11/30/16 0904    Education provided Yes   Education Details ther-ex   [employer]) Educated Patient   Methods Explanation;Demonstration;Tactile cues;Verbal cues   Comprehension Returned demonstration;Verbalized understanding        Objectives  S/P 5weeks -17 to 108 degrees seated R knee AROM  There-ex   Forward step up onto and off 3 inch step with one UE assist 10x2  Standing mini squats no UE assist 10x2  Ascending and descending 4 steps with bilateral UE assist in a reciprocal pattern   Side stepping without UE assist 32 ft the the R and 32 ft to the L with pt  holding SPC for 2 sets.  SLS on R LE 5x 10 seconds with one UE to finger touch assist     Improved exercise technique, movement at target joints, use of target muscles after min to mod verbal, visual, tactile cues.   Pt has demonstrated improved overall R knee AROM, strength, and function since initial evaluation. Pt currently able to ambulate with SPC without LOB and able to negotiate 4 steps with bilateral UE assist in a reciprogal gait pattern. Pt also shows independence and consistency with his HEP. Skilled PT services discharged with pt continuing progress with his HEP.               PT Long Term Goals - 11/28/16 1607      PT LONG TERM GOAL #1   Title Patient will improve R knee extension AROM to -10 degrees or less to promote ability to ambulate and perform standing tasks.    Baseline Seated R knee extension AROM -17 degrees (11/07/2016); -15 degrees AROM at start of session (11/28/2016)   Time 6   Period Weeks   Status On-going   Target Date 12/21/16     PT LONG TERM GOAL #2   Title Patient will improve R knee flexion seated AROM to at least 115 degrees to promote ability to ambulate, perform sit <> stand transfers, negotiate stairs, perform standing tasks.    Baseline seated R knee flexion AROM 76 degrees (11/07/2016); 105 degrees seated R knee flexion AROM at start of session (11/28/2016)   Time 6   Period Weeks   Status Partially Met   Target Date 12/21/16     PT LONG TERM GOAL #3   Title Patient will be able to ambulate at least 500 ft without use of AD to promote mobility.    Baseline Pt currently ambulates with a rolling walker (11/07/2016); able to ambulate with SPC safely at least 220 ft (11/28/2016)   Time 6   Period Weeks   Status Partially Met     PT LONG TERM GOAL #4   Title Pt will demonstrate at least 4+/5 R knee flexion and 5/5 R knee extension strength to promote ability to perform standing tasks.    Baseline R knee flexion and extension strength not yet  tested secondary to pt being only 2 weeks since surgery to allow for more healing (11/07/2016);  4+/5 R knee flexion, 5/5 R knee extension (11/28/2016)   Time 6   Period Weeks   Status Achieved               Plan - 11/30/16 0911    Clinical Impression Statement Pt has  demonstrated improved overall R knee AROM, strength, and function since initial evaluation. Pt currently able to ambulate with SPC without LOB and able to negotiate 4 steps with bilateral UE assist in a reciprogal gait pattern. Pt also shows independence and consistency with his HEP. Skilled PT services discharged with pt continuing progress with his HEP.    History and Personal Factors relevant to plan of care: S/P R TKA on 10/23/2016, difficulty walking, performing sit <> stand transfers, standing tasks at home   Clinical Presentation Stable   Clinical Presentation due to: Pt has made progress with PT towards goals.    Clinical Decision Making Low   Rehab Potential Good   Clinical Impairments Affecting Rehab Potential pain, swelling, healing time   PT Frequency 2x / week   PT Duration 6 weeks   PT Treatment/Interventions Gait training;Neuromuscular re-education;Therapeutic exercise;Therapeutic activities;Patient/family education;Electrical Stimulation;Manual techniques;Iontophoresis 67m/ml Dexamethasone   PT Next Visit Plan ROM, manual therapy PRN, gait, gentle strengthening when appropriate   Consulted and Agree with Plan of Care Patient      Patient will benefit from skilled therapeutic intervention in order to improve the following deficits and impairments:  Pain, Abnormal gait, Decreased balance, Decreased range of motion, Decreased strength, Difficulty walking  Visit Diagnosis: Difficulty in walking, not elsewhere classified  Muscle weakness (generalized)  Right knee pain, unspecified chronicity       G-Codes - [id number]    Functional Assessment Tool Used (Outpatient Only) Clinical presentation, patient  interview   Functional Limitation Mobility: Walking and moving around   Mobility: Walking and Moving Around Goal Status ([id number] At least 20 percent but less than 40 percent impaired, limited or restricted   Mobility: Walking and Moving Around Discharge Status [phone] At least 40 percent but less than 60 percent impaired, limited or restricted      Problem List Patient Active Problem List   Diagnosis Date Noted  . Pneumonia 07/19/2016  . BPH without urinary obstruction 06/03/2015  . Depression 03/05/2015  . Hyperlipidemia 09/03/2014  . Major depressive disorder, recurrent episode, moderate ([place] 09/03/2014  . Chronic low back pain 09/03/2014  . Arthralgia of both knees 09/03/2014  . Type 2 diabetes mellitus without complication ([place] 048/25/0037 . Allergic rhinitis 07/04/2014  . Anxiety 07/04/2014  . Continuous opioid dependence ([place] 07/04/2014  . DDD (degenerative disc disease), lumbar 07/04/2014  . Gout of foot 07/04/2014  . BP (high blood pressure) 07/04/2014  . Flu vaccine need 07/04/2014  . Adiposity 07/04/2014  . OA (osteoarthritis) of knee 07/04/2014  . Neuralgia neuritis, sciatic nerve 07/04/2014  . Arthralgia of multiple joints 07/04/2014  . H/O malignant neoplasm of skin 12/24/2013   Thank you for your referral.  MJoneen BoersPT, DPT   82018/09/22 12:23 PM  [facility] [address] [place] [address]Phone: [phone]  Fax:  [phone] Name: Evan COHICKMRN: [id number] of Birth: 808-03-1951

## 2016-11-30 NOTE — Patient Instructions (Addendum)
Mini squats with hand support for 2 sets of 10. Chair behind you for safety.    Step up and down 3 inch step with one hand support 10 repetition 2 sets   Can go up to 6 inches after 1-2 weeks with one hand assist 5 repetitions for 2 sets to start.

## 2016-12-05 ENCOUNTER — Ambulatory Visit: Payer: Medicare PPO

## 2016-12-12 ENCOUNTER — Other Ambulatory Visit: Payer: Self-pay | Admitting: Family Medicine

## 2016-12-12 ENCOUNTER — Ambulatory Visit (INDEPENDENT_AMBULATORY_CARE_PROVIDER_SITE_OTHER): Payer: Medicare PPO | Admitting: Family Medicine

## 2016-12-12 ENCOUNTER — Encounter: Payer: Self-pay | Admitting: Family Medicine

## 2016-12-12 VITALS — BP 154/78 | HR 89 | Temp 98.6°F | Resp 16 | Ht 74.0 in | Wt 264.4 lb

## 2016-12-12 DIAGNOSIS — N400 Enlarged prostate without lower urinary tract symptoms: Secondary | ICD-10-CM

## 2016-12-12 DIAGNOSIS — E119 Type 2 diabetes mellitus without complications: Secondary | ICD-10-CM | POA: Diagnosis not present

## 2016-12-12 DIAGNOSIS — I10 Essential (primary) hypertension: Secondary | ICD-10-CM

## 2016-12-12 DIAGNOSIS — J302 Other seasonal allergic rhinitis: Secondary | ICD-10-CM

## 2016-12-12 DIAGNOSIS — M5136 Other intervertebral disc degeneration, lumbar region: Secondary | ICD-10-CM

## 2016-12-12 DIAGNOSIS — E7800 Pure hypercholesterolemia, unspecified: Secondary | ICD-10-CM

## 2016-12-12 DIAGNOSIS — F331 Major depressive disorder, recurrent, moderate: Secondary | ICD-10-CM | POA: Diagnosis not present

## 2016-12-12 DIAGNOSIS — F419 Anxiety disorder, unspecified: Secondary | ICD-10-CM | POA: Diagnosis not present

## 2016-12-12 DIAGNOSIS — M1A4720 Other secondary chronic gout, left ankle and foot, without tophus (tophi): Secondary | ICD-10-CM

## 2016-12-12 DIAGNOSIS — M170 Bilateral primary osteoarthritis of knee: Secondary | ICD-10-CM

## 2016-12-12 LAB — POCT GLYCOSYLATED HEMOGLOBIN (HGB A1C): Hemoglobin A1C: 6.9

## 2016-12-12 MED ORDER — ALLOPURINOL 300 MG PO TABS: 300.0000 mg | ORAL_TABLET | Freq: Every evening | ORAL | 1 refills | Status: DC

## 2016-12-12 MED ORDER — GLIPIZIDE 5 MG PO TABS: 5.0000 mg | ORAL_TABLET | Freq: Every day | ORAL | 0 refills | Status: DC

## 2016-12-12 MED ORDER — METOPROLOL SUCCINATE ER 100 MG PO TB24: 100.0000 mg | ORAL_TABLET | ORAL | 0 refills | Status: DC

## 2016-12-12 MED ORDER — AMLODIPINE BESYLATE 10 MG PO TABS: 10.0000 mg | ORAL_TABLET | Freq: Every day | ORAL | 1 refills | Status: DC

## 2016-12-12 MED ORDER — HYDROCODONE-ACETAMINOPHEN 10-325 MG PO TABS: 1.0000 | ORAL_TABLET | Freq: Three times a day (TID) | ORAL | 0 refills | Status: AC | PRN

## 2016-12-12 MED ORDER — METOPROLOL SUCCINATE ER 25 MG PO TB24: 25.0000 mg | ORAL_TABLET | ORAL | 1 refills | Status: DC

## 2016-12-12 MED ORDER — GLUCOSE BLOOD VI STRP: ORAL_STRIP | 12 refills | Status: DC

## 2016-12-12 MED ORDER — FLUOXETINE HCL 40 MG PO CAPS: 40.0000 mg | ORAL_CAPSULE | Freq: Every morning | ORAL | 1 refills | Status: DC

## 2016-12-12 MED ORDER — LOSARTAN POTASSIUM 100 MG PO TABS: 100.0000 mg | ORAL_TABLET | Freq: Every day | ORAL | 1 refills | Status: DC

## 2016-12-12 MED ORDER — TAMSULOSIN HCL 0.4 MG PO CAPS: 0.4000 mg | ORAL_CAPSULE | Freq: Every day | ORAL | 0 refills | Status: DC

## 2016-12-12 MED ORDER — METOPROLOL SUCCINATE ER 100 MG PO TB24: 100.0000 mg | ORAL_TABLET | ORAL | 1 refills | Status: DC

## 2016-12-12 MED ORDER — FLUTICASONE PROPIONATE 50 MCG/ACT NA SUSP: 2.0000 | Freq: Every day | NASAL | 1 refills | Status: DC

## 2016-12-12 MED ORDER — TAMSULOSIN HCL 0.4 MG PO CAPS: 0.4000 mg | ORAL_CAPSULE | Freq: Every day | ORAL | 1 refills | Status: DC

## 2016-12-12 MED ORDER — CLONAZEPAM 1 MG PO TABS: 1.0000 mg | ORAL_TABLET | Freq: Two times a day (BID) | ORAL | 0 refills | Status: DC | PRN

## 2016-12-12 MED ORDER — ALLOPURINOL 300 MG PO TABS: 300.0000 mg | ORAL_TABLET | Freq: Every evening | ORAL | 0 refills | Status: DC

## 2016-12-12 MED ORDER — LOSARTAN POTASSIUM 100 MG PO TABS: 100.0000 mg | ORAL_TABLET | Freq: Every day | ORAL | 0 refills | Status: DC

## 2016-12-12 NOTE — Progress Notes (Signed)
Name: Evan Rivas   MRN: [id number]    DOB: 06/29/1950   Date:12/12/2016       Progress Note  Subjective  Chief Complaint  Chief Complaint  Patient presents with  . Follow-up    3 mo  . Medication Refill  . Diabetes  . Hyperlipidemia    Diabetes  He presents for his follow-up diabetic visit. He has type 2 diabetes mellitus. His disease course has been stable. Hypoglycemia symptoms include dizziness and sweats. Pertinent negatives for hypoglycemia include no headaches or nervousness/anxiousness. Pertinent negatives for diabetes include no blurred vision, no chest pain, no fatigue, no foot paresthesias, no polydipsia and no polyuria. Pertinent negatives for diabetic complications include no CVA or heart disease. Current diabetic treatment includes oral agent (dual therapy). He is following a generally unhealthy (eating foods that he normally doesn't eat becasue he has not been able to drive after knee surgery) diet. He participates in exercise every other day. He monitors blood glucose at home 1-2 x per day. His breakfast blood glucose range is generally 110-130 mg/dl. His bedtime blood glucose range is generally 110-130 mg/dl. An ACE inhibitor/angiotensin II receptor blocker is being taken.  Hyperlipidemia  This is a chronic problem. The problem is controlled. Recent lipid tests were reviewed and are normal. Pertinent negatives include no chest pain, leg pain, myalgias or shortness of breath. Current antihyperlipidemic treatment includes statins.  Anxiety  Presents for follow-up visit. Symptoms include dizziness. Patient reports no chest pain, excessive worry, insomnia, nervous/anxious behavior, palpitations, panic or shortness of breath. The severity of symptoms is moderate.    Hypertension  This is a chronic problem. The problem is unchanged. The problem is controlled. Associated symptoms include anxiety and sweats. Pertinent negatives include no blurred vision, chest pain, headaches,  palpitations or shortness of breath. Past treatments include angiotensin blockers, beta blockers and calcium channel blockers. There is no history of kidney disease, CAD/MI or CVA.  Arthritis  Presents for follow-up visit. He complains of pain. Affected locations include the right knee and left knee (lower back and hip). His pain is at a severity of 4/10. Pertinent negatives include no fatigue.     Past Medical History:  Diagnosis Date  . Anxiety    panic attacks  . Arthritis   . Cancer (HCC)    lip  . Depression   . Diabetes mellitus without complication ([place])    type 2  . GERD (gastroesophageal reflux disease)   . Gout   . History of kidney stones   . Hyperlipidemia   . Hypertension   . Pneumonia    2014 and 2018 ( 2018-klebsiella pneumonia     Past Surgical History:  Procedure Laterality Date  . ARTHROSCOPIC REPAIR ACL Left   . CYSTOSCOPY W/ URETERAL STENT PLACEMENT Right 09/07/2014   Procedure: CYSTOSCOPY WITH STENT REPLACEMENT;  Surgeon: Hollice Espy, MD;  Location: [facility];  Service: Urology;  Laterality: Right;  . CYSTOSCOPY/URETEROSCOPY/HOLMIUM LASER/STENT PLACEMENT Right 08/17/2014   Procedure: CYSTOSCOPY/URETEROSCOPY//STENT PLACEMENT/ attempt of lithotripsy;  Surgeon: Hollice Espy, MD;  Location: [facility];  Service: Urology;  Laterality: Right;  . HERNIA REPAIR    . MOHS SURGERY     lip  . TOTAL KNEE ARTHROPLASTY Left 04/24/2016   Procedure: LEFT TOTAL KNEE ARTHROPLASTY;  Surgeon: Gaynelle Arabian, MD;  [facility];  Service: Orthopedics;  Laterality: Left;  Adductor Block  . TOTAL KNEE ARTHROPLASTY Right 10/23/2016   Procedure: RIGHT TOTAL KNEE ARTHROPLASTY;  Surgeon: Gaynelle Arabian, MD;  Location:  [facility];  Service: Orthopedics;  Laterality: Right;  . URETEROSCOPY WITH HOLMIUM LASER LITHOTRIPSY Right 09/07/2014   Procedure: URETEROSCOPY WITH HOLMIUM LASER LITHOTRIPSY;  Surgeon: Hollice Espy, MD;  Location: [facility];  Service: Urology;  Laterality: Right;  [name]  VASECTOMY      Family History  Problem Relation Age of Onset  . Hypertension Mother   . Cancer Mother   . Stroke Father   . Depression Sister     Social History   Social History  . Marital status: Married    Spouse name: N/A  . Number of children: N/A  . Years of education: N/A   Occupational History  . Not on file.   Social History Main Topics  . Smoking status: Never Smoker  . Smokeless tobacco: Never Used  . Alcohol use No  . Drug use: No  . Sexual activity: Yes   Other Topics Concern  . Not on file   Social History Narrative  . No narrative on file     Current Outpatient Prescriptions:  .  allopurinol (ZYLOPRIM) 300 MG tablet, Take 1 tablet (300 mg total) by mouth every evening., Disp: 90 tablet, Rfl: 0 .  amLODipine (NORVASC) 10 MG tablet, Take 1 tablet (10 mg total) by mouth daily., Disp: 90 tablet, Rfl: 1 .  ASPIRIN 81 PO, Take by mouth., Disp: , Rfl:  .  atorvastatin (LIPITOR) 40 MG tablet, Take 1 tablet (40 mg total) by mouth at bedtime., Disp: 90 tablet, Rfl: 0 .  clonazePAM (KLONOPIN) 1 MG tablet, Take 1 tablet (1 mg total) by mouth 2 (two) times daily as needed. (Patient taking differently: Take 1 mg by mouth 2 (two) times daily as needed for anxiety. ), Disp: 180 tablet, Rfl: 0 .  FLUoxetine (PROZAC) 40 MG capsule, TAKE 1 CAPSULE EVERY MORNING, Disp: 90 capsule, Rfl: 1 .  fluticasone (FLONASE) 50 MCG/ACT nasal spray, Place 2 sprays into both nostrils daily. 2 sprays (Patient taking differently: Place 2 sprays into both nostrils daily as needed for allergies (for allergies/sinuses). ), Disp: 16 g, Rfl: 0 .  glipiZIDE (GLUCOTROL) 5 MG tablet, Take 1 tablet (5 mg total) by mouth daily., Disp: 90 tablet, Rfl: 0 .  HYDROcodone-acetaminophen (NORCO) 10-325 MG tablet, Take 1 tablet by mouth every 8 (eight) hours as needed., Disp: 90 tablet, Rfl: 0 .  losartan (COZAAR) 100 MG tablet, Take 1 tablet (100 mg total) by mouth at bedtime., Disp: 90 tablet, Rfl: 0 .   metFORMIN (GLUCOPHAGE-XR) 500 MG 24 hr tablet, Take 2 tablets (1,000 mg total) by mouth 2 (two) times daily. 1000mg  BID, Disp: 360 tablet, Rfl: 1 .  methocarbamol (ROBAXIN) 500 MG tablet, Take 1 tablet (500 mg total) by mouth every 6 (six) hours as needed for muscle spasms., Disp: 80 tablet, Rfl: 0 .  metoprolol succinate (TOPROL-XL) 100 MG 24 hr tablet, Take 1 tablet (100 mg total) by mouth every morning., Disp: 90 tablet, Rfl: 0 .  metoprolol succinate (TOPROL-XL) 25 MG 24 hr tablet, Take 1 tablet (25 mg total) by mouth every morning., Disp: 90 tablet, Rfl: 1 .  rivaroxaban (XARELTO) 10 MG TABS tablet, Take 1 tablet (10 mg total) by mouth daily with breakfast. Take Xarelto for two and a half more weeks following discharge from the hospital, then discontinue Xarelto. Once the patient has completed the Xarelto, they may resume the 81 mg Aspirin., Disp: 19 tablet, Rfl: 0 .  tamsulosin (FLOMAX) 0.4 MG CAPS capsule, Take 1 capsule (0.4 mg total) by  mouth daily after supper., Disp: 90 capsule, Rfl: 0 .  tiZANidine (ZANAFLEX) 4 MG tablet, Take 1 tablet (4 mg total) by mouth at bedtime., Disp: 90 tablet, Rfl: 0  Allergies  Allergen Reactions  . Celebrex  [Celecoxib]     GI  . Tetanus Toxoids Other (See Comments)    unkown  . Tetanus-Diphtheria Toxoids Td     Other reaction(s): Unknown     Review of Systems  Constitutional: Negative for fatigue.  Eyes: Negative for blurred vision.  Respiratory: Negative for shortness of breath.   Cardiovascular: Negative for chest pain and palpitations.  Musculoskeletal: Positive for arthritis. Negative for myalgias.  Neurological: Positive for dizziness. Negative for headaches.  Endo/Heme/Allergies: Negative for polydipsia.  Psychiatric/Behavioral: The patient is not nervous/anxious and does not have insomnia.       Objective  Vitals:   12/12/16 0828  BP: (!) 154/78  Pulse: 89  Resp: 16  Temp: 98.6 F (37 C)  TempSrc: Oral  SpO2: (!) 89%  Weight:  264 lb 6.4 oz (119.9 kg)  Height: 6\' 2"  (1.88 m)    Physical Exam  Constitutional: He is oriented to person, place, and time and well-developed, well-nourished, and in no distress.  HENT:  Head: Normocephalic and atraumatic.  Cardiovascular: Normal rate, regular rhythm and normal heart sounds.   No murmur heard. Pulmonary/Chest: Effort normal and breath sounds normal. He has no wheezes.  Musculoskeletal:       Right knee: He exhibits swelling. Tenderness found.       Left knee: He exhibits swelling. Tenderness found.       Right ankle: He exhibits no swelling.       Left ankle: He exhibits no swelling.       Legs: Neurological: He is alert and oriented to person, place, and time.  Psychiatric: Mood, memory, affect and judgment normal.  Nursing note and vitals reviewed.     Assessment & Plan  1. Type 2 diabetes mellitus without complication, without long-term current use of insulin (HCC) Point-of-care A1c 6.9%, well-controlled diabetes - POCT HgB A1C - glipiZIDE (GLUCOTROL) 5 MG tablet; Take 1 tablet (5 mg total) by mouth daily.  Dispense: 90 tablet; Refill: 0  2. Anxiety Symptoms of anxiety are stable and controlled on present treatment, and patient compliant with controlled substances agreement, refills provided - clonazePAM (KLONOPIN) 1 MG tablet; Take 1 tablet (1 mg total) by mouth 2 (two) times daily as needed for anxiety.  Dispense: 180 tablet; Refill: 0  3. BPH without urinary obstruction  - tamsulosin (FLOMAX) 0.4 MG CAPS capsule; Take 1 capsule (0.4 mg total) by mouth daily after supper.  Dispense: 90 capsule; Refill: 0  4. DDD (degenerative disc disease), lumbar Compliant with controlled substances agreement, takes Norco every 8 hours as needed for relief, - HYDROcodone-acetaminophen (NORCO) 10-325 MG tablet; Take 1 tablet by mouth every 8 (eight) hours as needed.  Dispense: 90 tablet; Refill: 0  5. Primary osteoarthritis of both knees Patient is status post  bilateral total knee replacement, takes Norco every 8 hours as needed for relief, refills provided - HYDROcodone-acetaminophen (NORCO) 10-325 MG tablet; Take 1 tablet by mouth every 8 (eight) hours as needed.  Dispense: 90 tablet; Refill: 0  6. Essential hypertension  - amLODipine (NORVASC) 10 MG tablet; Take 1 tablet (10 mg total) by mouth daily.  Dispense: 90 tablet; Refill: 1 - losartan (COZAAR) 100 MG tablet; Take 1 tablet (100 mg total) by mouth at bedtime.  Dispense: 90 tablet; Refill:  0 - metoprolol succinate (TOPROL-XL) 100 MG 24 hr tablet; Take 1 tablet (100 mg total) by mouth every morning.  Dispense: 90 tablet; Refill: 0 - metoprolol succinate (TOPROL-XL) 25 MG 24 hr tablet; Take 1 tablet (25 mg total) by mouth every morning.  Dispense: 90 tablet; Refill: 1  7. Major depressive disorder, recurrent episode, moderate (HCC)  - FLUoxetine (PROZAC) 40 MG capsule; Take 1 capsule (40 mg total) by mouth every morning.  Dispense: 90 capsule; Refill: 1  8. 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  Dispense: 90 tablet; Refill: 0 - Uric acid  9. Pure hypercholesterolemia  - COMPLETE METABOLIC PANEL WITH GFR - Lipid panel  Stevana Dufner [facility] 12/12/2016 8:33 AM

## 2016-12-13 LAB — COMPREHENSIVE METABOLIC PANEL
A/G RATIO: 1.5 (ref 1.2–2.2)
ALBUMIN: 4.4 g/dL (ref 3.6–4.8)
ALT: 14 IU/L (ref 0–44)
AST: 13 IU/L (ref 0–40)
Alkaline Phosphatase: 99 IU/L (ref 39–117)
BILIRUBIN TOTAL: 0.3 mg/dL (ref 0.0–1.2)
BUN / CREAT RATIO: 16 (ref 10–24)
BUN: 18 mg/dL (ref 8–27)
CALCIUM: 9.8 mg/dL (ref 8.6–10.2)
CO2: 23 mmol/L (ref 20–29)
Chloride: 102 mmol/L (ref 96–106)
Creatinine, Ser: 1.12 mg/dL (ref 0.76–1.27)
GFR, EST AFRICAN AMERICAN: 79 mL/min/{1.73_m2} (ref >59)
GFR, EST NON AFRICAN AMERICAN: 68 mL/min/{1.73_m2} (ref >59)
GLOBULIN, TOTAL: 2.9 g/dL (ref 1.5–4.5)
Glucose: 193 mg/dL — ABNORMAL HIGH (ref 65–99)
POTASSIUM: 4.2 mmol/L (ref 3.5–5.2)
SODIUM: 147 mmol/L — AB (ref 134–144)
TOTAL PROTEIN: 7.3 g/dL (ref 6.0–8.5)

## 2016-12-13 LAB — LIPID PANEL WITH LDL/HDL RATIO
Cholesterol, Total: 147 mg/dL (ref 100–199)
HDL: 39 mg/dL — ABNORMAL LOW (ref >39)
LDL CALC: 81 mg/dL (ref 0–99)
LDl/HDL Ratio: 2.1 ratio (ref 0.0–3.6)
Triglycerides: 133 mg/dL (ref 0–149)
VLDL Cholesterol Cal: 27 mg/dL (ref 5–40)

## 2016-12-13 LAB — URIC ACID: Uric Acid: 4.2 mg/dL (ref 3.7–8.6)

## 2017-01-15 ENCOUNTER — Other Ambulatory Visit: Payer: Self-pay

## 2017-01-15 NOTE — Telephone Encounter (Signed)
Patient called requesting a refill of his Norco 10-325mg  tablets.   HYDROcodone-acetaminophen (NORCO) 10-325 MG tablet [[id number]] ENDED   Dose: 1 tablet Route: Oral Frequency: Every 8 hours PRN  Dispense Quantity:  90 tablet Refills:  0 Fills remaining:  --        Sig: Take 1 tablet by mouth every 8 (eight) hours as needed.       Written Date:  12/12/16 Expiration Date:  06/10/17    Start Date:  12/12/16 End Date:  01/11/17    Ordering Provider:  -- Authorizing Provider:  Roselee Nova, MD Ordering User:  Roselee Nova, MD        Diagnosis Association: DDD (degenerative disc disease), lumbar (M51.36); Primary osteoarthritis of both knees (M17.0)      Please call when ready for pick up.

## 2017-01-18 ENCOUNTER — Telehealth: Payer: Self-pay | Admitting: Family Medicine

## 2017-01-18 NOTE — Telephone Encounter (Signed)
Spoke with patient and informed him that Dr. Manuella Ghazi wanted him to come in sooner to discuss a referral to pain management.  Patient was also informed that Dr. Manuella Ghazi will be leaving the practice in Feb. 2018 and the other providers will not be able to refill his pain medications.  Patient stated that he will decide if he is going to stay with practice but he will keep his appointment in Dec. 2018.

## 2017-01-18 NOTE — Telephone Encounter (Signed)
He will need an appointment to discuss referral to pain clinic for continuing opioid management after I leave the practice.

## 2017-01-19 ENCOUNTER — Ambulatory Visit (INDEPENDENT_AMBULATORY_CARE_PROVIDER_SITE_OTHER): Payer: Medicare PPO | Admitting: Family Medicine

## 2017-01-19 ENCOUNTER — Encounter: Payer: Self-pay | Admitting: Family Medicine

## 2017-01-19 VITALS — BP 148/92 | HR 81 | Temp 98.2°F | Resp 14 | Wt 270.3 lb

## 2017-01-19 DIAGNOSIS — M51369 Other intervertebral disc degeneration, lumbar region without mention of lumbar back pain or lower extremity pain: Secondary | ICD-10-CM

## 2017-01-19 DIAGNOSIS — M5136 Other intervertebral disc degeneration, lumbar region: Secondary | ICD-10-CM | POA: Diagnosis not present

## 2017-01-19 DIAGNOSIS — Z23 Encounter for immunization: Secondary | ICD-10-CM

## 2017-01-19 DIAGNOSIS — M170 Bilateral primary osteoarthritis of knee: Secondary | ICD-10-CM

## 2017-01-19 MED ORDER — HYDROCODONE-ACETAMINOPHEN 10-325 MG PO TABS: 1.0000 | ORAL_TABLET | Freq: Four times a day (QID) | ORAL | 0 refills | Status: AC | PRN

## 2017-01-19 NOTE — Progress Notes (Signed)
The following letter was provided to Evan Rivas during their visit today: ---------------------------------------  Dear valued [facility] Patient,  I am writing to share that as of May 18, 2017, I will no longer be seeing patients at [facility]. While it has been my privilege to care for you as a physician, I have decided to move outside of [place] to pursue other opportunities.  The staff at [facility] has been supportive of my decision and are supportive of any patients who have been under my care.  They will be happy to provide care to you and your family.  The office staff will do everything they can to ensure a seamless transition of care at [facility].  However if you are on any controlled substance medications (i.e. Pain medication or benzodiazepines), they will no longer be able to refill those medications, but we will be more than happy to refer you to a specialist.  [place] center will also assist you with the transfer of medical records should you wish to seek care elsewhere.  If you have any questions about your future care, you may call the office at [phone].  I have enjoyed getting to know my patients here and I wish you the very best.  Sincerely,  Rochel Brome, MD  ---------------------------------------  A written copy of this letter was given to the patient.  The patient verbalizes understanding of the letter, and does request referral to specialist or to new primary care provider.  This decision has been conveyed to Dr. Manuella Ghazi, who will place any appropriate referrals during today's visit.

## 2017-01-19 NOTE — Progress Notes (Signed)
Name: Evan Rivas   MRN: [id number]    DOB: [birth year]   Date:01/19/2017       Progress Note  Subjective  Chief Complaint  Chief Complaint  Patient presents with  . Flu Vaccine  . Medication Refill    Pain meds    HPI  Patient presents for follow up on pain management, he takes Hydrocodone-Acetaminophen 10-325 mg every 6 hours as needed for pain in both knees, lower back, and right hip. He has had bilateral total knee replacements, pain is rated at 3/10 today, reports Hydrocodone works well for the pain, no reported adverse effects.   Past Medical History:  Diagnosis Date  . Anxiety    panic attacks  . Arthritis   . Cancer (HCC)    lip  . Depression   . Diabetes mellitus without complication ([place])    type 2  . GERD (gastroesophageal reflux disease)   . Gout   . History of kidney stones   . Hyperlipidemia   . Hypertension   . Pneumonia    2014 and 2018 ( 2018-klebsiella pneumonia     Past Surgical History:  Procedure Laterality Date  . ARTHROSCOPIC REPAIR ACL Left   . CYSTOSCOPY W/ URETERAL STENT PLACEMENT Right 09/07/2014   Procedure: CYSTOSCOPY WITH STENT REPLACEMENT;  Surgeon: Hollice Espy, MD;  Location: [facility];  Service: Urology;  Laterality: Right;  . CYSTOSCOPY/URETEROSCOPY/HOLMIUM LASER/STENT PLACEMENT Right 08/17/2014   Procedure: CYSTOSCOPY/URETEROSCOPY//STENT PLACEMENT/ attempt of lithotripsy;  Surgeon: Hollice Espy, MD;  Location: [facility];  Service: Urology;  Laterality: Right;  . HERNIA REPAIR    . MOHS SURGERY     lip  . TOTAL KNEE ARTHROPLASTY Left 04/24/2016   Procedure: LEFT TOTAL KNEE ARTHROPLASTY;  Surgeon: Gaynelle Arabian, MD;  Location: [facility];  Service: Orthopedics;  Laterality: Left;  Adductor Block  . TOTAL KNEE ARTHROPLASTY Right 10/23/2016   Procedure: RIGHT TOTAL KNEE ARTHROPLASTY;  Surgeon: Gaynelle Arabian, MD;  Location: [facility];  Service: Orthopedics;  Laterality: Right;  . URETEROSCOPY WITH HOLMIUM LASER LITHOTRIPSY Right 09/07/2014     Procedure: URETEROSCOPY WITH HOLMIUM LASER LITHOTRIPSY;  Surgeon: Hollice Espy, MD;  Location: [facility];  Service: Urology;  Laterality: Right;  [name] VASECTOMY      Family History  Problem Relation Age of Onset  . Hypertension Mother   . Cancer Mother   . Stroke Father   . Depression Sister     Social History   Social History  . Marital status: Married    Spouse name: N/A  . Number of children: N/A  . Years of education: N/A   Occupational History  . Not on file.   Social History Main Topics  . Smoking status: Never Smoker  . Smokeless tobacco: Never Used  . Alcohol use No  . Drug use: No  . Sexual activity: Yes   Other Topics Concern  . Not on file   Social History Narrative  . No narrative on file     Current Outpatient Prescriptions:  .  allopurinol (ZYLOPRIM) 300 MG tablet, Take 1 tablet (300 mg total) by mouth every evening., Disp: 90 tablet, Rfl: 1 .  amLODipine (NORVASC) 10 MG tablet, Take 1 tablet (10 mg total) by mouth daily., Disp: 90 tablet, Rfl: 1 .  ASPIRIN 81 PO, Take by mouth., Disp: , Rfl:  .  atorvastatin (LIPITOR) 40 MG tablet, Take 1 tablet (40 mg total) by mouth at bedtime., Disp: 90 tablet, Rfl: 0 .  clonazePAM (KLONOPIN) 1 MG tablet,  Take 1 tablet (1 mg total) by mouth 2 (two) times daily as needed for anxiety., Disp: 180 tablet, Rfl: 0 .  FLUoxetine (PROZAC) 40 MG capsule, Take 1 capsule (40 mg total) by mouth every morning., Disp: 90 capsule, Rfl: 1 .  fluticasone (FLONASE) 50 MCG/ACT nasal spray, Place 2 sprays into both nostrils daily. 2 sprays, Disp: 16 g, Rfl: 1 .  glipiZIDE (GLUCOTROL) 5 MG tablet, Take 1 tablet (5 mg total) by mouth daily., Disp: 90 tablet, Rfl: 0 .  glucose blood (ACCU-CHEK AVIVA PLUS) test strip, Use as instructed, Disp: 100 each, Rfl: 12 .  HYDROcodone-acetaminophen (NORCO) 10-325 MG tablet, Take 1 tablet by mouth every 6 (six) hours as needed., Disp: , Rfl:  .  losartan (COZAAR) 100 MG tablet, Take 1 tablet (100 mg  total) by mouth at bedtime., Disp: 90 tablet, Rfl: 1 .  metFORMIN (GLUCOPHAGE-XR) 500 MG 24 hr tablet, Take 2 tablets (1,000 mg total) by mouth 2 (two) times daily. 1055m BID, Disp: 360 tablet, Rfl: 1 .  methocarbamol (ROBAXIN) 500 MG tablet, Take 1 tablet (500 mg total) by mouth every 6 (six) hours as needed for muscle spasms., Disp: 80 tablet, Rfl: 0 .  metoprolol succinate (TOPROL-XL) 100 MG 24 hr tablet, Take 1 tablet (100 mg total) by mouth every morning., Disp: 90 tablet, Rfl: 1 .  metoprolol succinate (TOPROL-XL) 25 MG 24 hr tablet, Take 1 tablet (25 mg total) by mouth every morning., Disp: 90 tablet, Rfl: 1 .  nabumetone (RELAFEN) 500 MG tablet, Take 1 tablet by mouth 2 (two) times daily., Disp: , Rfl:  .  tamsulosin (FLOMAX) 0.4 MG CAPS capsule, Take 1 capsule (0.4 mg total) by mouth daily after supper., Disp: 90 capsule, Rfl: 1 .  tiZANidine (ZANAFLEX) 4 MG tablet, Take 1 tablet (4 mg total) by mouth at bedtime., Disp: 90 tablet, Rfl: 0 .  rivaroxaban (XARELTO) 10 MG TABS tablet, Take 1 tablet (10 mg total) by mouth daily with breakfast. Take Xarelto for two and a half more weeks following discharge from the hospital, then discontinue Xarelto. Once the patient has completed the Xarelto, they may resume the 81 mg Aspirin. (Patient not taking: Reported on 01/19/2017), Disp: 19 tablet, Rfl: 0  Allergies  Allergen Reactions  . Celebrex  [Celecoxib]     GI  . Tetanus Toxoids Other (See Comments)    unkown  . Tetanus-Diphtheria Toxoids Td     Other reaction(s): Unknown     ROS  We see history of present illness for complete discussion of ROS  Objective  Vitals:   01/19/17 1041  BP: (!) 148/92  Pulse: 81  Resp: 14  Temp: 98.2 F (36.8 C)  TempSrc: Oral  SpO2: 97%  Weight: 270 lb 4.8 oz (122.6 kg)    Physical Exam  Constitutional: He is well-developed, well-nourished, and in no distress.  Cardiovascular: Normal rate, regular rhythm and normal heart sounds.   No murmur  heard. Pulmonary/Chest: Effort normal and breath sounds normal. He has no wheezes.  Musculoskeletal:       Right knee: He exhibits no swelling and no erythema.       Left knee: He exhibits no swelling.       Lumbar back: He exhibits tenderness, pain and spasm.       Back:       Legs: Nursing note and vitals reviewed.      Recent Results (from the past 2160 hour(s))  Glucose, capillary     Status: Abnormal  Collection Time: 10/23/16  5:40 AM  Result Value Ref Range   Glucose-Capillary 164 (H) 65 - 99 mg/dL   Comment 1 Notify RN   Glucose, capillary     Status: Abnormal   Collection Time: 10/23/16  8:45 AM  Result Value Ref Range   Glucose-Capillary 150 (H) 65 - 99 mg/dL  Glucose, capillary     Status: Abnormal   Collection Time: 10/23/16 11:31 AM  Result Value Ref Range   Glucose-Capillary 222 (H) 65 - 99 mg/dL  Glucose, capillary     Status: Abnormal   Collection Time: 10/23/16  4:57 PM  Result Value Ref Range   Glucose-Capillary 300 (H) 65 - 99 mg/dL  Glucose, capillary     Status: Abnormal   Collection Time: 10/23/16  9:48 PM  Result Value Ref Range   Glucose-Capillary 294 (H) 65 - 99 mg/dL  Basic metabolic panel     Status: Abnormal   Collection Time: 10/24/16  5:25 AM  Result Value Ref Range   Sodium 138 135 - 145 mmol/L   Potassium 3.4 (L) 3.5 - 5.1 mmol/L   Chloride 103 101 - 111 mmol/L   CO2 25 22 - 32 mmol/L   Glucose, Bld 228 (H) 65 - 99 mg/dL   BUN 17 6 - 20 mg/dL   Creatinine, Ser 0.98 0.61 - 1.24 mg/dL   Calcium 8.4 (L) 8.9 - 10.3 mg/dL   GFR calc non Af Amer >60 >60 mL/min   GFR calc Af Amer >60 >60 mL/min    Comment: (NOTE) The eGFR has been calculated using the CKD EPI equation. This calculation has not been validated in all clinical situations. eGFR's persistently <60 mL/min signify possible Chronic Kidney Disease.    Anion gap 10 5 - 15  Glucose, capillary     Status: Abnormal   Collection Time: 10/24/16  7:14 AM  Result Value Ref Range    Glucose-Capillary 199 (H) 65 - 99 mg/dL  CBC     Status: Abnormal   Collection Time: 10/24/16  7:17 AM  Result Value Ref Range   WBC 11.7 (H) 4.0 - 10.5 K/uL    Comment: WHITE COUNT CONFIRMED ON SMEAR   RBC 4.28 4.22 - 5.81 MIL/uL   Hemoglobin 13.7 13.0 - 17.0 g/dL   HCT 38.3 (L) 39.0 - 52.0 %   MCV 89.5 78.0 - 100.0 fL   MCH 32.0 26.0 - 34.0 pg   MCHC 35.8 30.0 - 36.0 g/dL   RDW 14.1 11.5 - 15.5 %   Platelets 199 150 - 400 K/uL    Comment: SPECIMEN CHECKED FOR CLOTS PLATELET COUNT CONFIRMED BY SMEAR QUANTITY NOT SUFFICIENT TO REPEAT TEST   Glucose, capillary     Status: Abnormal   Collection Time: 10/24/16 11:45 AM  Result Value Ref Range   Glucose-Capillary 239 (H) 65 - 99 mg/dL  Glucose, capillary     Status: Abnormal   Collection Time: 10/24/16  4:51 PM  Result Value Ref Range   Glucose-Capillary 233 (H) 65 - 99 mg/dL  Glucose, capillary     Status: Abnormal   Collection Time: 10/24/16  8:50 PM  Result Value Ref Range   Glucose-Capillary 227 (H) 65 - 99 mg/dL  CBC     Status: Abnormal   Collection Time: 10/25/16  5:28 AM  Result Value Ref Range   WBC 10.4 4.0 - 10.5 K/uL   RBC 3.94 (L) 4.22 - 5.81 MIL/uL   Hemoglobin 12.2 (L) 13.0 - 17.0 g/dL  HCT 35.6 (L) 39.0 - 52.0 %   MCV 90.4 78.0 - 100.0 fL   MCH 31.0 26.0 - 34.0 pg   MCHC 34.3 30.0 - 36.0 g/dL   RDW 14.2 11.5 - 15.5 %   Platelets 194 150 - 400 K/uL  Basic metabolic panel     Status: Abnormal   Collection Time: 10/25/16  5:28 AM  Result Value Ref Range   Sodium 141 135 - 145 mmol/L   Potassium 3.1 (L) 3.5 - 5.1 mmol/L   Chloride 102 101 - 111 mmol/L   CO2 28 22 - 32 mmol/L   Glucose, Bld 210 (H) 65 - 99 mg/dL   BUN 15 6 - 20 mg/dL   Creatinine, Ser 1.02 0.61 - 1.24 mg/dL   Calcium 8.6 (L) 8.9 - 10.3 mg/dL   GFR calc non Af Amer >60 >60 mL/min   GFR calc Af Amer >60 >60 mL/min    Comment: (NOTE) The eGFR has been calculated using the CKD EPI equation. This calculation has not been validated in all  clinical situations. eGFR's persistently <60 mL/min signify possible Chronic Kidney Disease.    Anion gap 11 5 - 15  Glucose, capillary     Status: Abnormal   Collection Time: 10/25/16  7:28 AM  Result Value Ref Range   Glucose-Capillary 201 (H) 65 - 99 mg/dL  POCT HgB A1C     Status: Abnormal   Collection Time: 12/12/16  8:33 AM  Result Value Ref Range   Hemoglobin A1C 6.9   Comprehensive metabolic panel     Status: Abnormal   Collection Time: 12/12/16  9:21 AM  Result Value Ref Range   Glucose 193 (H) 65 - 99 mg/dL   BUN 18 8 - 27 mg/dL   Creatinine, Ser 1.12 0.76 - 1.27 mg/dL   GFR calc non Af Amer 68 >59 mL/min/1.73   GFR calc Af Amer 79 >59 mL/min/1.73   BUN/Creatinine Ratio 16 10 - 24   Sodium 147 (H) 134 - 144 mmol/L   Potassium 4.2 3.5 - 5.2 mmol/L   Chloride 102 96 - 106 mmol/L   CO2 23 20 - 29 mmol/L   Calcium 9.8 8.6 - 10.2 mg/dL   Total Protein 7.3 6.0 - 8.5 g/dL   Albumin 4.4 3.6 - 4.8 g/dL   Globulin, Total 2.9 1.5 - 4.5 g/dL   Albumin/Globulin Ratio 1.5 1.2 - 2.2   Bilirubin Total 0.3 0.0 - 1.2 mg/dL   Alkaline Phosphatase 99 39 - 117 IU/L   AST 13 0 - 40 IU/L   ALT 14 0 - 44 IU/L  Lipid Panel With LDL/HDL Ratio     Status: Abnormal   Collection Time: 12/12/16  9:21 AM  Result Value Ref Range   Cholesterol, Total 147 100 - 199 mg/dL   Triglycerides 133 0 - 149 mg/dL   HDL 39 (L) >39 mg/dL   VLDL Cholesterol Cal 27 5 - 40 mg/dL   LDL Calculated 81 0 - 99 mg/dL   LDl/HDL Ratio 2.1 0.0 - 3.6 ratio    Comment:                                     LDL/HDL Ratio  Men  Women                               1/2 Avg.Risk  1.0    1.5                                   Avg.Risk  3.6    3.2                                2X Avg.Risk  6.2    5.0                                3X Avg.Risk  8.0    6.1   Uric acid     Status: None   Collection Time: 12/12/16  9:21 AM  Result Value Ref Range   Uric Acid 4.2 3.7 - 8.6 mg/dL     Comment:            Therapeutic target for gout patients: <6.0     Assessment & Plan  1. Needs flu shot - Flu vaccine HIGH DOSE PF (Fluzone High dose)  2. DDD (degenerative disc disease), lumbar Stable and responsive to hydrocodone taken every 6 hours as needed for pain relief, explained that after my departure from the practice, he will need to be referred to pain clinic for continued management of low back pain and arthritis in both knees. Patient verbalized agreement and will think about it and will discuss at next appointment in December. - HYDROcodone-acetaminophen (NORCO) 10-325 MG tablet; Take 1 tablet by mouth every 6 (six) hours as needed.  Dispense: 120 tablet; Refill: 0  3. Primary osteoarthritis of both knees Status post bilateral total knee replacement - HYDROcodone-acetaminophen (NORCO) 10-325 MG tablet; Take 1 tablet by mouth every 6 (six) hours as needed.  Dispense: 120 tablet; Refill: 0   Rosa Gambale [facility] 01/19/2017 10:56 AM

## 2017-01-22 DIAGNOSIS — Z23 Encounter for immunization: Secondary | ICD-10-CM | POA: Diagnosis not present

## 2017-01-22 DIAGNOSIS — M170 Bilateral primary osteoarthritis of knee: Secondary | ICD-10-CM | POA: Diagnosis not present

## 2017-01-22 DIAGNOSIS — M5136 Other intervertebral disc degeneration, lumbar region: Secondary | ICD-10-CM | POA: Diagnosis not present

## 2017-01-23 ENCOUNTER — Other Ambulatory Visit: Payer: Self-pay | Admitting: Family Medicine

## 2017-01-23 DIAGNOSIS — E7800 Pure hypercholesterolemia, unspecified: Secondary | ICD-10-CM

## 2017-02-19 ENCOUNTER — Telehealth: Payer: Self-pay | Admitting: Family Medicine

## 2017-02-19 DIAGNOSIS — M170 Bilateral primary osteoarthritis of knee: Secondary | ICD-10-CM

## 2017-02-19 DIAGNOSIS — M5136 Other intervertebral disc degeneration, lumbar region: Secondary | ICD-10-CM

## 2017-02-19 NOTE — Telephone Encounter (Signed)
Copied from [place] [id number]. Topic: Inquiry >> Feb 19, 2017 11:47 AM Evan Rivas wrote: Reason for CRM: PT called to get his Rx of hydrocodone filled, pt is aware that Dr. Manuella Ghazi is out and if he as to wait until Wednesday for his signature he will but if another physician is available to sign for it give the pt a call when it is ready, contact pt for any concerns

## 2017-02-20 MED ORDER — HYDROCODONE-ACETAMINOPHEN 10-325 MG PO TABS: 1.0000 | ORAL_TABLET | Freq: Four times a day (QID) | ORAL | 0 refills | Status: DC | PRN

## 2017-02-20 NOTE — Telephone Encounter (Signed)
Prescription for hydrocodone-acetaminophen is printed and is ready for pickup.

## 2017-02-21 NOTE — Telephone Encounter (Signed)
Spoke to patient and mention to pt that the Rx was approved by Dr. Manuella Ghazi but he need to sign it. Mention to pt that he may not in today to sign it today. Dr.shah will be in Monday. Pt understood

## 2017-03-13 ENCOUNTER — Ambulatory Visit: Payer: Medicare PPO | Admitting: Family Medicine

## 2017-03-17 ENCOUNTER — Ambulatory Visit: Payer: Medicare PPO | Admitting: Family Medicine

## 2017-03-17 ENCOUNTER — Encounter: Payer: Self-pay | Admitting: Family Medicine

## 2017-03-17 VITALS — BP 150/76 | HR 75 | Temp 98.4°F | Resp 14 | Wt 273.4 lb

## 2017-03-17 DIAGNOSIS — E7800 Pure hypercholesterolemia, unspecified: Secondary | ICD-10-CM

## 2017-03-17 DIAGNOSIS — E119 Type 2 diabetes mellitus without complications: Secondary | ICD-10-CM

## 2017-03-17 DIAGNOSIS — M170 Bilateral primary osteoarthritis of knee: Secondary | ICD-10-CM | POA: Diagnosis not present

## 2017-03-17 DIAGNOSIS — M5136 Other intervertebral disc degeneration, lumbar region: Secondary | ICD-10-CM | POA: Diagnosis not present

## 2017-03-17 DIAGNOSIS — I10 Essential (primary) hypertension: Secondary | ICD-10-CM

## 2017-03-17 DIAGNOSIS — N400 Enlarged prostate without lower urinary tract symptoms: Secondary | ICD-10-CM

## 2017-03-17 DIAGNOSIS — M1A4720 Other secondary chronic gout, left ankle and foot, without tophus (tophi): Secondary | ICD-10-CM | POA: Diagnosis not present

## 2017-03-17 LAB — GLUCOSE, POCT (MANUAL RESULT ENTRY): POC GLUCOSE: 155 mg/dL — AB (ref 70–99)

## 2017-03-17 LAB — POCT GLYCOSYLATED HEMOGLOBIN (HGB A1C): HEMOGLOBIN A1C: 6.8

## 2017-03-17 MED ORDER — TAMSULOSIN HCL 0.4 MG PO CAPS: 0.4000 mg | ORAL_CAPSULE | Freq: Every day | ORAL | 1 refills | Status: DC

## 2017-03-17 MED ORDER — METOPROLOL SUCCINATE ER 100 MG PO TB24: 100.0000 mg | ORAL_TABLET | ORAL | 1 refills | Status: DC

## 2017-03-17 MED ORDER — METOPROLOL SUCCINATE ER 25 MG PO TB24: 25.0000 mg | ORAL_TABLET | ORAL | 1 refills | Status: DC

## 2017-03-17 MED ORDER — GLIPIZIDE 5 MG PO TABS: 5.0000 mg | ORAL_TABLET | Freq: Every day | ORAL | 0 refills | Status: DC

## 2017-03-17 MED ORDER — METFORMIN HCL ER 500 MG PO TB24: 1000.0000 mg | ORAL_TABLET | Freq: Two times a day (BID) | ORAL | 1 refills | Status: DC

## 2017-03-17 MED ORDER — NABUMETONE 500 MG PO TABS: 500.0000 mg | ORAL_TABLET | Freq: Two times a day (BID) | ORAL | 0 refills | Status: DC

## 2017-03-17 MED ORDER — AMLODIPINE BESYLATE 10 MG PO TABS: 10.0000 mg | ORAL_TABLET | Freq: Every day | ORAL | 1 refills | Status: DC

## 2017-03-17 MED ORDER — ALLOPURINOL 300 MG PO TABS: 300.0000 mg | ORAL_TABLET | Freq: Every evening | ORAL | 1 refills | Status: DC

## 2017-03-17 MED ORDER — LOSARTAN POTASSIUM 100 MG PO TABS: 100.0000 mg | ORAL_TABLET | Freq: Every day | ORAL | 1 refills | Status: DC

## 2017-03-17 MED ORDER — HYDROCODONE-ACETAMINOPHEN 10-325 MG PO TABS: 1.0000 | ORAL_TABLET | Freq: Four times a day (QID) | ORAL | 0 refills | Status: DC | PRN
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
## 2017-03-23 ENCOUNTER — Ambulatory Visit: Payer: Medicare PPO | Admitting: Family Medicine

## 2017-04-24 ENCOUNTER — Ambulatory Visit: Payer: Medicare PPO | Admitting: Family Medicine

## 2017-04-24 ENCOUNTER — Encounter: Payer: Self-pay | Admitting: Family Medicine

## 2017-04-24 ENCOUNTER — Telehealth: Payer: Self-pay | Admitting: Family Medicine

## 2017-04-24 VITALS — BP 168/86 | HR 100 | Temp 98.3°F | Resp 18 | Ht 74.0 in | Wt 275.5 lb

## 2017-04-24 DIAGNOSIS — M25562 Pain in left knee: Secondary | ICD-10-CM

## 2017-04-24 DIAGNOSIS — R600 Localized edema: Secondary | ICD-10-CM

## 2017-04-24 DIAGNOSIS — M2550 Pain in unspecified joint: Secondary | ICD-10-CM

## 2017-04-24 DIAGNOSIS — M51369 Other intervertebral disc degeneration, lumbar region without mention of lumbar back pain or lower extremity pain: Secondary | ICD-10-CM

## 2017-04-24 DIAGNOSIS — F419 Anxiety disorder, unspecified: Secondary | ICD-10-CM | POA: Diagnosis not present

## 2017-04-24 DIAGNOSIS — Z5181 Encounter for therapeutic drug level monitoring: Secondary | ICD-10-CM | POA: Diagnosis not present

## 2017-04-24 DIAGNOSIS — F331 Major depressive disorder, recurrent, moderate: Secondary | ICD-10-CM

## 2017-04-24 DIAGNOSIS — M5136 Other intervertebral disc degeneration, lumbar region: Secondary | ICD-10-CM | POA: Diagnosis not present

## 2017-04-24 DIAGNOSIS — M25561 Pain in right knee: Secondary | ICD-10-CM | POA: Diagnosis not present

## 2017-04-24 DIAGNOSIS — I10 Essential (primary) hypertension: Secondary | ICD-10-CM

## 2017-04-24 DIAGNOSIS — F1120 Opioid dependence, uncomplicated: Secondary | ICD-10-CM | POA: Diagnosis not present

## 2017-04-24 DIAGNOSIS — M170 Bilateral primary osteoarthritis of knee: Secondary | ICD-10-CM | POA: Diagnosis not present

## 2017-04-24 MED ORDER — FLUOXETINE HCL 20 MG PO CAPS: 20.0000 mg | ORAL_CAPSULE | Freq: Every day | ORAL | 0 refills | Status: DC

## 2017-04-24 MED ORDER — HYDROCHLOROTHIAZIDE 25 MG PO TABS: 25.0000 mg | ORAL_TABLET | Freq: Every day | ORAL | 0 refills | Status: DC

## 2017-04-24 NOTE — Telephone Encounter (Signed)
Copied from [place]. Topic: Quick Communication - See Telephone Encounter >> Apr 24, 2017  3:48 PM Vernona Rieger wrote: CRM for notification. See Telephone encounter for:   04/24/17.  FLUoxetine (PROZAC) 20 MG capsule hydrochlorothiazide (HYDRODIURIL) 25 MG tablet He needs this sent to [facility] & not [facility]. Please send to [facility] on [address]

## 2017-04-24 NOTE — Progress Notes (Signed)
Name: Evan Rivas   MRN: [id number]    DOB: [birth year]   Date:04/24/2017       Progress Note  Subjective  Chief Complaint  Chief Complaint  Patient presents with  . Hypertension  . Leg Swelling    HPI  Pt presents with concern for HTN and Leg Swelling along with several other concerns:  HTN: BP has been increasing since he has been weaning himself off of his Clonazepam and Norco.  He is taking losartan 100mg , amlodipine 10mg , metoprolol 125mg  daily - has been very compliant with his medications.  He used to take lisinopril, but developed chronic cough - cough stopped after he stopped taking the medication.  Has never taken a diuretic - he did have an acute kidney issue after taking Levaquin, however this has completely resolved - GFR, creatinine, and BUN have been normalized since July 2018.  We will recheck labs today and plan to check again at follow up - we will start HCTZ today.  He has never been diagnosed with HF; he denies shortness of breath or chest pain.  Depression and Anxiety: He stopped taking his clonazepam and prozac on 03/10/2017 because his PCP is leaving and he did not want to be referred to psychiatry.  He did not know that Prozac was not a controlled substance. We will restart prozac, and after taking this for some time, we will consider starting Buspar for anxiety if still not well controlled.  Chronic Opioid Dependence: He started weaning off of his Norco because his PCP is leaving and he did not want to be referred to pain management. He has seen chiropractor in the past and a psychiatrist before he went on disability for his knees, and he does not feel that he needs to go through this again.  He has chronic knee pain - has had bilateral knee replacements, also suffers from chronic lumbar pain and right hip pain.  He is no longer seeing Ortho for his knee pain.  He is now willing to see pain management to discuss alternative options.  Taking 1.5mg  tabs/day for 20 days  (down from TID dosing).  He will now decrease to 1tab/day for 10-20 days, then to 1/2 tab for 10-20 days, then as needed for 10-20 days.  Patient Active Problem List   Diagnosis Date Noted  . Pneumonia 07/19/2016  . BPH without urinary obstruction 06/03/2015  . Depression 03/05/2015  . Hyperlipidemia 09/03/2014  . Major depressive disorder, recurrent episode, moderate ([place]) 09/03/2014  . Chronic low back pain 09/03/2014  . Arthralgia of both knees 09/03/2014  . Type 2 diabetes mellitus without complication ([place]) 23/76/2831  . Allergic rhinitis 07/04/2014  . Anxiety 07/04/2014  . Continuous opioid dependence ([place]) 07/04/2014  . DDD (degenerative disc disease), lumbar 07/04/2014  . Gout of foot 07/04/2014  . Essential hypertension 07/04/2014  . Flu vaccine need 07/04/2014  . Adiposity 07/04/2014  . OA (osteoarthritis) of knee 07/04/2014  . Neuralgia neuritis, sciatic nerve 07/04/2014  . Arthralgia of multiple joints 07/04/2014  . H/O malignant neoplasm of skin 12/24/2013    Social History   Tobacco Use  . Smoking status: Never Smoker  . Smokeless tobacco: Never Used  Substance Use Topics  . Alcohol use: No     Current Outpatient Medications:  .  allopurinol (ZYLOPRIM) 300 MG tablet, Take 1 tablet (300 mg total) by mouth every evening., Disp: 90 tablet, Rfl: 1 .  amLODipine (NORVASC) 10 MG tablet, Take 1 tablet (10 mg total) by  mouth daily., Disp: 90 tablet, Rfl: 1 .  ASPIRIN 81 PO, Take by mouth., Disp: , Rfl:  .  atorvastatin (LIPITOR) 40 MG tablet, TAKE 1 TABLET AT BEDTIME, Disp: 90 tablet, Rfl: 0 .  fluticasone (FLONASE) 50 MCG/ACT nasal spray, Place 2 sprays into both nostrils daily. 2 sprays, Disp: 16 g, Rfl: 1 .  glipiZIDE (GLUCOTROL) 5 MG tablet, Take 1 tablet (5 mg total) by mouth daily., Disp: 90 tablet, Rfl: 0 .  glucose blood (ACCU-CHEK AVIVA PLUS) test strip, Use as instructed, Disp: 100 each, Rfl: 12 .  HYDROcodone-acetaminophen (NORCO) 10-325 MG tablet, Take 1  tablet by mouth every 6 (six) hours as needed., Disp: 30 tablet, Rfl: 0 .  losartan (COZAAR) 100 MG tablet, Take 1 tablet (100 mg total) by mouth at bedtime., Disp: 90 tablet, Rfl: 1 .  methocarbamol (ROBAXIN) 500 MG tablet, Take 1 tablet (500 mg total) by mouth every 6 (six) hours as needed for muscle spasms., Disp: 80 tablet, Rfl: 0 .  metoprolol succinate (TOPROL-XL) 100 MG 24 hr tablet, Take 1 tablet (100 mg total) by mouth every morning., Disp: 90 tablet, Rfl: 1 .  metoprolol succinate (TOPROL-XL) 25 MG 24 hr tablet, Take 1 tablet (25 mg total) by mouth every morning., Disp: 90 tablet, Rfl: 1 .  nabumetone (RELAFEN) 500 MG tablet, Take 1 tablet (500 mg total) by mouth 2 (two) times daily., Disp: 180 tablet, Rfl: 0 .  rivaroxaban (XARELTO) 10 MG TABS tablet, Take 1 tablet (10 mg total) by mouth daily with breakfast. Take Xarelto for two and a half more weeks following discharge from the hospital, then discontinue Xarelto. Once the patient has completed the Xarelto, they may resume the 81 mg Aspirin., Disp: 19 tablet, Rfl: 0 .  tamsulosin (FLOMAX) 0.4 MG CAPS capsule, Take 1 capsule (0.4 mg total) by mouth daily after supper., Disp: 90 capsule, Rfl: 1 .  tiZANidine (ZANAFLEX) 4 MG tablet, Take 1 tablet (4 mg total) by mouth at bedtime., Disp: 90 tablet, Rfl: 0 .  metFORMIN (GLUCOPHAGE-XR) 500 MG 24 hr tablet, Take 2 tablets (1,000 mg total) by mouth 2 (two) times daily. 1000mg  BID, Disp: 360 tablet, Rfl: 1  Allergies  Allergen Reactions  . Celebrex  [Celecoxib]     GI  . Tetanus Toxoids Other (See Comments)    unkown  . Tetanus-Diphtheria Toxoids Td     Other reaction(s): Unknown    ROS  Ten systems reviewed and is negative except as mentioned in HPI  Objective  Vitals:   04/24/17 1006 04/24/17 1012  BP: (!) 162/84 (!) 168/86  Pulse: 100   Resp: 18   Temp: 98.3 F (36.8 C)   TempSrc: Oral   SpO2: 94%   Weight: 275 lb 8 oz (125 kg)   Height: 6\' 2"  (1.88 m)    Body mass index  is 35.37 kg/m.  Nursing Note and Vital Signs reviewed.  Physical Exam Constitutional: Patient appears well-developed and well-nourished. Obese. No distress.  HEENT: head atraumatic, normocephalic Cardiovascular: Normal rate, regular rhythm, S1/S2 present.  No murmur or rub heard. +1 BLE edema. Pulmonary/Chest: Effort normal and breath sounds clear. No respiratory distress or retractions. Abdominal: Soft and non-tender, bowel sounds present x4 quadrants.  Psychiatric: Patient has a normal mood and affect. behavior is normal. Judgment and thought content normal.  No results found for this or any previous visit (from the past 72 hour(s)).  Assessment & Plan  1. Essential hypertension - BASIC METABOLIC PANEL WITH GFR -  B Nat Peptide  2. Primary osteoarthritis of both knees - Ambulatory referral to Pain Clinic 3. DDD (degenerative disc disease), lumbar - Ambulatory referral to Pain Clinic 4. Arthralgia of both knees - Ambulatory referral to Pain Clinic 5. Arthralgia of multiple joints - Ambulatory referral to Pain Clinic 6. Continuous opioid dependence ([place]) - Ambulatory referral to Pain Clinic - Taking 1.5mg  tabs/day for 20 days (down from TID dosing).  He will now decrease to 1tab/day for 10-20 days, then to 1/2 tab for 10-20 days, then as needed for 10-20 days.  7. Major depressive disorder, recurrent episode, moderate ([facility]) - FLUoxetine (PROZAC) 20 MG capsule; Take 1 capsule (20 mg total) by mouth daily.  Dispense: 15 capsule; Refill: 0 - We will restart prozac, close follow up to be maintained to ensure stabilization. Restart at 20mg , will increase to 40mg  after 2-3 weeks.  If anxiety still not well controlled after 6-8 weeks, will consider adding buspar or hydroxyzine. 8. Anxiety - FLUoxetine (PROZAC) 20 MG capsule; Take 1 capsule (20 mg total) by mouth daily.  Dispense: 15 capsule; Refill: 0 - We will restart prozac, close follow up to be maintained to ensure stabilization.  Restart at 20mg , will increase to 40mg  after 2-3 weeks.  If anxiety still not well controlled after 6-8 weeks, will consider adding buspar or hydroxyzine  9. Bilateral lower extremity edema - Comprehensive metabolic panel - B Nat Peptide - CBC - We will add HCTZ 25mg  to help with BP control and also BLE edema.  Has no hx HF, but is advised if BNP is elevated we will refer to cardiology for further work-up.  10. Encounter for medication monitoring - Comprehensive metabolic panel - CBC  -Red flags and when to present for emergency care or RTC including fever >101.61F, chest pain, shortness of breath, new/worsening/un-resolving symptoms, significant LE edema reviewed with patient at time of visit. Follow up and care instructions discussed and provided in AVS.

## 2017-04-24 NOTE — Patient Instructions (Addendum)
DASH Eating Plan DASH stands for "Dietary Approaches to Stop Hypertension." The DASH eating plan is a healthy eating plan that has been shown to reduce high blood pressure (hypertension). It may also reduce your risk for type 2 diabetes, heart disease, and stroke. The DASH eating plan may also help with weight loss. What are tips for following this plan? General guidelines  Avoid eating more than 2,300 mg (milligrams) of salt (sodium) a day. If you have hypertension, you may need to reduce your sodium intake to 1,500 mg a day.  Limit alcohol intake to no more than 1 drink a day for nonpregnant women and 2 drinks a day for men. One drink equals 12 oz of beer, 5 oz of wine, or 1 oz of hard liquor.  Work with your health care provider to maintain a healthy body weight or to lose weight. Ask what an ideal weight is for you.  Get at least 30 minutes of exercise that causes your heart to beat faster (aerobic exercise) most days of the week. Activities may include walking, swimming, or biking.  Work with your health care provider or diet and nutrition specialist (dietitian) to adjust your eating plan to your individual calorie needs. Reading food labels  Check food labels for the amount of sodium per serving. Choose foods with less than 5 percent of the Daily Value of sodium. Generally, foods with less than 300 mg of sodium per serving fit into this eating plan.  To find whole grains, look for the word "whole" as the first word in the ingredient list. Shopping  Buy products labeled as "low-sodium" or "no salt added."  Buy fresh foods. Avoid canned foods and premade or frozen meals. Cooking  Avoid adding salt when cooking. Use salt-free seasonings or herbs instead of table salt or sea salt. Check with your health care provider or pharmacist before using salt substitutes.  Do not fry foods. Cook foods using healthy methods such as baking, boiling, grilling, and broiling instead.  Cook with  heart-healthy oils, such as olive, canola, soybean, or sunflower oil. Meal planning   Eat a balanced diet that includes: ? 5 or more servings of fruits and vegetables each day. At each meal, try to fill half of your plate with fruits and vegetables. ? Up to 6-8 servings of whole grains each day. ? Less than 6 oz of lean meat, poultry, or fish each day. A 3-oz serving of meat is about the same size as a deck of cards. One egg equals 1 oz. ? 2 servings of low-fat dairy each day. ? A serving of nuts, seeds, or beans 5 times each week. ? Heart-healthy fats. Healthy fats called Omega-3 fatty acids are found in foods such as flaxseeds and coldwater fish, like sardines, salmon, and mackerel.  Limit how much you eat of the following: ? Canned or prepackaged foods. ? Food that is high in trans fat, such as fried foods. ? Food that is high in saturated fat, such as fatty meat. ? Sweets, desserts, sugary drinks, and other foods with added sugar. ? Full-fat dairy products.  Do not salt foods before eating.  Try to eat at least 2 vegetarian meals each week.  Eat more home-cooked food and less restaurant, buffet, and fast food.  When eating at a restaurant, ask that your food be prepared with less salt or no salt, if possible. What foods are recommended? The items listed may not be a complete list. Talk with your dietitian about what   dietary choices are best for you. Grains Whole-grain or whole-wheat bread. Whole-grain or whole-wheat pasta. Brown rice. Oatmeal. Quinoa. Bulgur. Whole-grain and low-sodium cereals. Pita bread. Low-fat, low-sodium crackers. Whole-wheat flour tortillas. Vegetables Fresh or frozen vegetables (raw, steamed, roasted, or grilled). Low-sodium or reduced-sodium tomato and vegetable juice. Low-sodium or reduced-sodium tomato sauce and tomato paste. Low-sodium or reduced-sodium canned vegetables. Fruits All fresh, dried, or frozen fruit. Canned fruit in natural juice (without  added sugar). Meat and other protein foods Skinless chicken or turkey. Ground chicken or turkey. Pork with fat trimmed off. Fish and seafood. Egg whites. Dried beans, peas, or lentils. Unsalted nuts, nut butters, and seeds. Unsalted canned beans. Lean cuts of beef with fat trimmed off. Low-sodium, lean deli meat. Dairy Low-fat (1%) or fat-free (skim) milk. Fat-free, low-fat, or reduced-fat cheeses. Nonfat, low-sodium ricotta or cottage cheese. Low-fat or nonfat yogurt. Low-fat, low-sodium cheese. Fats and oils Soft margarine without trans fats. Vegetable oil. Low-fat, reduced-fat, or light mayonnaise and salad dressings (reduced-sodium). Canola, safflower, olive, soybean, and sunflower oils. Avocado. Seasoning and other foods Herbs. Spices. Seasoning mixes without salt. Unsalted popcorn and pretzels. Fat-free sweets. What foods are not recommended? The items listed may not be a complete list. Talk with your dietitian about what dietary choices are best for you. Grains Baked goods made with fat, such as croissants, muffins, or some breads. Dry pasta or rice meal packs. Vegetables Creamed or fried vegetables. Vegetables in a cheese sauce. Regular canned vegetables (not low-sodium or reduced-sodium). Regular canned tomato sauce and paste (not low-sodium or reduced-sodium). Regular tomato and vegetable juice (not low-sodium or reduced-sodium). Pickles. Olives. Fruits Canned fruit in a light or heavy syrup. Fried fruit. Fruit in cream or butter sauce. Meat and other protein foods Fatty cuts of meat. Ribs. Fried meat. Bacon. Sausage. Bologna and other processed lunch meats. Salami. Fatback. Hotdogs. Bratwurst. Salted nuts and seeds. Canned beans with added salt. Canned or smoked fish. Whole eggs or egg yolks. Chicken or turkey with skin. Dairy Whole or 2% milk, cream, and half-and-half. Whole or full-fat cream cheese. Whole-fat or sweetened yogurt. Full-fat cheese. Nondairy creamers. Whipped toppings.  Processed cheese and cheese spreads. Fats and oils Butter. Stick margarine. Lard. Shortening. Ghee. Bacon fat. Tropical oils, such as coconut, palm kernel, or palm oil. Seasoning and other foods Salted popcorn and pretzels. Onion salt, garlic salt, seasoned salt, table salt, and sea salt. Worcestershire sauce. Tartar sauce. Barbecue sauce. Teriyaki sauce. Soy sauce, including reduced-sodium. Steak sauce. Canned and packaged gravies. Fish sauce. Oyster sauce. Cocktail sauce. Horseradish that you find on the shelf. Ketchup. Mustard. Meat flavorings and tenderizers. Bouillon cubes. Hot sauce and Tabasco sauce. Premade or packaged marinades. Premade or packaged taco seasonings. Relishes. Regular salad dressings. Where to find more information:  National Heart, Lung, and Blood Institute: www.nhlbi.nih.gov  American Heart Association: www.heart.org Summary  The DASH eating plan is a healthy eating plan that has been shown to reduce high blood pressure (hypertension). It may also reduce your risk for type 2 diabetes, heart disease, and stroke.  With the DASH eating plan, you should limit salt (sodium) intake to 2,300 mg a day. If you have hypertension, you may need to reduce your sodium intake to 1,500 mg a day.  When on the DASH eating plan, aim to eat more fresh fruits and vegetables, whole grains, lean proteins, low-fat dairy, and heart-healthy fats.  Work with your health care provider or diet and nutrition specialist (dietitian) to adjust your eating plan to your individual   calorie needs. This information is not intended to replace advice given to you by your health care provider. Make sure you discuss any questions you have with your health care provider. Document Released: 03/09/2011 Document Revised: 03/13/2016 Document Reviewed: 03/13/2016 Elsevier Interactive Patient Education  2018 [employer]  

## 2017-04-25 LAB — CBC
HEMOGLOBIN: 14.4 g/dL (ref 13.0–17.7)
Hematocrit: 42.7 % (ref 37.5–51.0)
MCH: 31.4 pg (ref 26.6–33.0)
MCHC: 33.7 g/dL (ref 31.5–35.7)
MCV: 93 fL (ref 79–97)
Platelets: 216 10*3/uL (ref 150–379)
RBC: 4.59 x10E6/uL (ref 4.14–5.80)
RDW: 14.4 % (ref 12.3–15.4)
WBC: 5.9 10*3/uL (ref 3.4–10.8)

## 2017-04-25 LAB — COMPREHENSIVE METABOLIC PANEL
ALT: 19 IU/L (ref 0–44)
AST: 16 IU/L (ref 0–40)
Albumin/Globulin Ratio: 1.7 (ref 1.2–2.2)
Albumin: 4.4 g/dL (ref 3.6–4.8)
Alkaline Phosphatase: 97 IU/L (ref 39–117)
BUN/Creatinine Ratio: 12 (ref 10–24)
BUN: 11 mg/dL (ref 8–27)
Bilirubin Total: 0.4 mg/dL (ref 0.0–1.2)
CALCIUM: 8.8 mg/dL (ref 8.6–10.2)
CO2: 24 mmol/L (ref 20–29)
CREATININE: 0.95 mg/dL (ref 0.76–1.27)
Chloride: 102 mmol/L (ref 96–106)
GFR, EST AFRICAN AMERICAN: 96 mL/min/{1.73_m2} (ref >59)
GFR, EST NON AFRICAN AMERICAN: 83 mL/min/{1.73_m2} (ref >59)
GLUCOSE: 226 mg/dL — AB (ref 65–99)
Globulin, Total: 2.6 g/dL (ref 1.5–4.5)
Potassium: 3.8 mmol/L (ref 3.5–5.2)
Sodium: 143 mmol/L (ref 134–144)
TOTAL PROTEIN: 7 g/dL (ref 6.0–8.5)

## 2017-04-25 LAB — BRAIN NATRIURETIC PEPTIDE: BNP: 65 pg/mL (ref 0.0–100.0)

## 2017-04-25 MED ORDER — FLUOXETINE HCL 20 MG PO CAPS: 20.0000 mg | ORAL_CAPSULE | Freq: Every day | ORAL | 0 refills | Status: DC

## 2017-04-25 MED ORDER — HYDROCHLOROTHIAZIDE 25 MG PO TABS: 25.0000 mg | ORAL_TABLET | Freq: Every day | ORAL | 0 refills | Status: DC

## 2017-04-25 NOTE — Telephone Encounter (Signed)
Patient notified

## 2017-04-25 NOTE — Telephone Encounter (Signed)
Please advise 

## 2017-04-25 NOTE — Telephone Encounter (Signed)
Please call Evan Rivas and apologize, I have corrected the pharmacy.  If he has any issues, let us know.

## 2017-05-07 ENCOUNTER — Encounter: Payer: Self-pay | Admitting: Student in an Organized Health Care Education/Training Program

## 2017-05-07 ENCOUNTER — Ambulatory Visit
Admission: RE | Admit: 2017-05-07 | Discharge: 2017-05-07 | Disposition: A | Discharge status: Home / Self Care | Payer: Medicare PPO | Source: Ambulatory Visit | Attending: Student in an Organized Health Care Education/Training Program | Admitting: Student in an Organized Health Care Education/Training Program

## 2017-05-07 ENCOUNTER — Ambulatory Visit
Payer: Medicare PPO | Attending: Student in an Organized Health Care Education/Training Program | Admitting: Student in an Organized Health Care Education/Training Program

## 2017-05-07 ENCOUNTER — Other Ambulatory Visit: Payer: Self-pay

## 2017-05-07 VITALS — BP 169/92 | HR 83 | Temp 98.2°F | Ht 75.0 in | Wt 275.0 lb

## 2017-05-07 DIAGNOSIS — M545 Low back pain: Secondary | ICD-10-CM | POA: Insufficient documentation

## 2017-05-07 DIAGNOSIS — Z823 Family history of stroke: Secondary | ICD-10-CM | POA: Insufficient documentation

## 2017-05-07 DIAGNOSIS — M25562 Pain in left knee: Secondary | ICD-10-CM

## 2017-05-07 DIAGNOSIS — M25552 Pain in left hip: Secondary | ICD-10-CM

## 2017-05-07 DIAGNOSIS — Z85828 Personal history of other malignant neoplasm of skin: Secondary | ICD-10-CM | POA: Diagnosis not present

## 2017-05-07 DIAGNOSIS — E119 Type 2 diabetes mellitus without complications: Secondary | ICD-10-CM | POA: Insufficient documentation

## 2017-05-07 DIAGNOSIS — M109 Gout, unspecified: Secondary | ICD-10-CM | POA: Diagnosis not present

## 2017-05-07 DIAGNOSIS — Z8701 Personal history of pneumonia (recurrent): Secondary | ICD-10-CM | POA: Diagnosis not present

## 2017-05-07 DIAGNOSIS — Z887 Allergy status to serum and vaccine status: Secondary | ICD-10-CM | POA: Insufficient documentation

## 2017-05-07 DIAGNOSIS — Z7982 Long term (current) use of aspirin: Secondary | ICD-10-CM | POA: Diagnosis not present

## 2017-05-07 DIAGNOSIS — Z7984 Long term (current) use of oral hypoglycemic drugs: Secondary | ICD-10-CM | POA: Diagnosis not present

## 2017-05-07 DIAGNOSIS — G8929 Other chronic pain: Secondary | ICD-10-CM | POA: Diagnosis not present

## 2017-05-07 DIAGNOSIS — E785 Hyperlipidemia, unspecified: Secondary | ICD-10-CM | POA: Diagnosis not present

## 2017-05-07 DIAGNOSIS — M47896 Other spondylosis, lumbar region: Secondary | ICD-10-CM | POA: Insufficient documentation

## 2017-05-07 DIAGNOSIS — Z79899 Other long term (current) drug therapy: Secondary | ICD-10-CM | POA: Diagnosis not present

## 2017-05-07 DIAGNOSIS — Z96653 Presence of artificial knee joint, bilateral: Secondary | ICD-10-CM | POA: Diagnosis not present

## 2017-05-07 DIAGNOSIS — Z87442 Personal history of urinary calculi: Secondary | ICD-10-CM | POA: Diagnosis not present

## 2017-05-07 DIAGNOSIS — G894 Chronic pain syndrome: Secondary | ICD-10-CM

## 2017-05-07 DIAGNOSIS — M25561 Pain in right knee: Secondary | ICD-10-CM

## 2017-05-07 DIAGNOSIS — F331 Major depressive disorder, recurrent, moderate: Secondary | ICD-10-CM | POA: Insufficient documentation

## 2017-05-07 DIAGNOSIS — Z8249 Family history of ischemic heart disease and other diseases of the circulatory system: Secondary | ICD-10-CM | POA: Insufficient documentation

## 2017-05-07 DIAGNOSIS — I10 Essential (primary) hypertension: Secondary | ICD-10-CM | POA: Insufficient documentation

## 2017-05-07 DIAGNOSIS — M5136 Other intervertebral disc degeneration, lumbar region: Secondary | ICD-10-CM | POA: Insufficient documentation

## 2017-05-07 DIAGNOSIS — F419 Anxiety disorder, unspecified: Secondary | ICD-10-CM | POA: Insufficient documentation

## 2017-05-07 DIAGNOSIS — M4316 Spondylolisthesis, lumbar region: Secondary | ICD-10-CM | POA: Insufficient documentation

## 2017-05-07 DIAGNOSIS — Z886 Allergy status to analgesic agent status: Secondary | ICD-10-CM | POA: Insufficient documentation

## 2017-05-07 DIAGNOSIS — Z818 Family history of other mental and behavioral disorders: Secondary | ICD-10-CM | POA: Diagnosis not present

## 2017-05-07 DIAGNOSIS — K219 Gastro-esophageal reflux disease without esophagitis: Secondary | ICD-10-CM | POA: Insufficient documentation

## 2017-05-07 DIAGNOSIS — M25551 Pain in right hip: Secondary | ICD-10-CM

## 2017-05-07 DIAGNOSIS — Z79891 Long term (current) use of opiate analgesic: Secondary | ICD-10-CM | POA: Diagnosis not present

## 2017-05-07 DIAGNOSIS — N400 Enlarged prostate without lower urinary tract symptoms: Secondary | ICD-10-CM | POA: Diagnosis not present

## 2017-05-07 DIAGNOSIS — M533 Sacrococcygeal disorders, not elsewhere classified: Secondary | ICD-10-CM | POA: Diagnosis not present

## 2017-05-07 MED ORDER — GABAPENTIN 300 MG PO CAPS: 300.0000 mg | ORAL_CAPSULE | Freq: Every day | ORAL | 1 refills | Status: DC

## 2017-05-07 NOTE — Progress Notes (Signed)
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 replacement   2. Arthralgia of both knees   3. Chronic bilateral low back pain without sciatica   4. Pain of both hip joints   5. Chronic pain syndrome   General Recommendations: The pain condition that the patient suffers from is  best treated with a multidisciplinary approach that involves an increase in physical activity to prevent de-conditioning and worsening of the pain cycle, as well as psychological counseling (formal and/or informal) to address the co-morbid psychological affects of pain. Treatment will often involve judicious use of pain medications and interventional procedures to decrease the pain, allowing the patient to participate in the physical activity that will ultimately produce long-lasting pain reductions. The goal of the multidisciplinary approach is to return the patient to a higher level of overall function and to restore their ability to perform activities of daily living.   67 year old male who presents with a chief complaint of axial low back pain, right hip pain, bilateral knee pain.  Patient's low back pain  is secondary to lumbar degenerative disc disease, lumbar facet arthropathy most pronounced at L3, L4, L5 from his previous x-rays that were obtained in 2014.  On physical exam patient also has pain with axial loading and lateral rotation that is most pronounced in his lower lumbar region.  Patient is obese.  In regards to his knee pain, this is secondary to bilateral knee osteoarthritis status post bilateral knee replacement surgery.  In regards to his hip pain, he does have point tenderness over his right greater trochanteric bursa.  Patient also has positive Patrick's test bilaterally suggesting SI joint pathology.  Today we discussed potential treatment plans.  At this point I would like to gather additional diagnostic information including x-rays of his lumbar spine, SI joints.  We discussed lumbar facet medial branch nerve blocks, bilateral SI joint injections, bilateral genicular nerve block.  In regards to medication management, we will have the patient complete a urine drug screen.  This should be positive for hydrocodone.  I also advised the patient about our clinic policy that he cannot utilize any benzodiazepines if he is to be a patient at our clinic.  Furthermore I will add gabapentin 300 mg nightly to assist with his pain symptoms.  Patient has been weaning his opioid intake which I commended him on.  Patient will follow with me after his imaging and his UDS.  Plan: -X-rays of lumbar spine x-ray, SI joints bilaterally -UDS.  I expect this to be positive for hydrocodone and its metabolites -Start gabapentin 300 mg nightly.  Prescription provided -We discussed bilateral lumbar facet medial branch nerve blocks for facet arthropathy and lumbar spondylosis.  We also discussed bilateral genicular nerve block for his bilateral knee pain.   Ordered Lab-work, Procedure(s), Referral(s), & Consult(s): Orders Placed This Encounter  Procedures  . DG Lumbar Spine Complete W/Bend  . DG Si Joints  .  Compliance Drug Analysis, Ur   Pharmacotherapy (current): Medications ordered:  Meds ordered this encounter  Medications  . DISCONTD: gabapentin (NEURONTIN) 300 MG capsule    Sig: Take 1 capsule (300 mg total) by mouth at bedtime.    Dispense:  30 capsule    Refill:  1  . gabapentin (NEURONTIN) 300 MG capsule    Sig: Take 1 capsule (300 mg total) by mouth at bedtime.    Dispense:  30 capsule    Refill:  1   Medications administered during this visit: Amauris Debois. Blyth had no medications administered during this visit.    Interventional management options: Mr. Viner was informed that there is no guarantee that he would be a candidate for interventional therapies. The decision will be based on the results of diagnostic studies, as well as Mr. Brocious's risk profile.  Procedure(s) under consideration:  -Bilateral  diagnostic L3, L4, L5 medial branch nerve block -Bilateral genicular nerve block -Bilateral SI joint injections.   Provider-requested follow-up: Return in about 2 weeks (around 05/21/2017) for Medication Management.  Future Appointments  Date Time Provider [place]  05/08/2017  8:00 AM Hubbard Hartshorn, FNP [facility]  05/22/2017  8:45 AM Gillis Santa, MD [facility] None  06/15/2017  8:00 AM Hubbard Hartshorn, FNP [facility]    Primary Care Physician: Hubbard Hartshorn, FNP Location: [facility] Note by: Gillis Santa, M.D, Date: 05/07/2017; Time: 11:19 AM  Patient Instructions  1. UDS today 2. Start Gabapentin 300 mg qhs 3. Xrays of lumbar spine and SI joints 4. Follow up in 2 weeks

## 2017-05-07 NOTE — Patient Instructions (Signed)
1. UDS today 2. Start Gabapentin 300 mg qhs 3. Xrays of lumbar spine and SI joints 4. Follow up in 2 weeks

## 2017-05-08 ENCOUNTER — Encounter: Payer: Self-pay | Admitting: Family Medicine

## 2017-05-08 ENCOUNTER — Ambulatory Visit: Payer: Medicare PPO | Admitting: Family Medicine

## 2017-05-08 VITALS — BP 132/68 | HR 91 | Temp 98.5°F | Resp 18 | Ht 74.0 in | Wt 270.3 lb

## 2017-05-08 DIAGNOSIS — R600 Localized edema: Secondary | ICD-10-CM

## 2017-05-08 DIAGNOSIS — G8929 Other chronic pain: Secondary | ICD-10-CM

## 2017-05-08 DIAGNOSIS — M545 Low back pain, unspecified: Secondary | ICD-10-CM

## 2017-05-08 DIAGNOSIS — F1120 Opioid dependence, uncomplicated: Secondary | ICD-10-CM

## 2017-05-08 DIAGNOSIS — F419 Anxiety disorder, unspecified: Secondary | ICD-10-CM

## 2017-05-08 DIAGNOSIS — I10 Essential (primary) hypertension: Secondary | ICD-10-CM

## 2017-05-08 DIAGNOSIS — E119 Type 2 diabetes mellitus without complications: Secondary | ICD-10-CM

## 2017-05-08 DIAGNOSIS — F331 Major depressive disorder, recurrent, moderate: Secondary | ICD-10-CM | POA: Diagnosis not present

## 2017-05-08 MED ORDER — HYDROCHLOROTHIAZIDE 25 MG PO TABS
25.0000 mg | ORAL_TABLET | Freq: Every day | ORAL | 1 refills | Status: DC
Start: 2017-05-08 — End: 2017-09-17

## 2017-05-08 MED ORDER — FLUOXETINE HCL 40 MG PO CAPS: 40.0000 mg | ORAL_CAPSULE | Freq: Every day | ORAL | 1 refills | Status: DC

## 2017-05-08 MED ORDER — EMPAGLIFLOZIN 10 MG PO TABS: 10.0000 mg | ORAL_TABLET | Freq: Every day | ORAL | 0 refills | Status: DC

## 2017-05-08 MED ORDER — EMPAGLIFLOZIN 25 MG PO TABS: 25.0000 mg | ORAL_TABLET | Freq: Every day | ORAL | 0 refills | Status: DC

## 2017-05-08 NOTE — Progress Notes (Signed)
Name: Evan Rivas   MRN: [id number]    DOB: 04-30-1950   Date:05/08/2017       Progress Note  Subjective  Chief Complaint  Chief Complaint  Patient presents with  . Follow-up    swelling in feet and ankles    HPI  Chronic Pain and Opioid Dependence:  He was able to see Dr. [name] yesterday and was put on Gabapentin 300mg  at night - says he slept well last night and is feeling good today.  HE is down to 1 Norco a day right now, and has plans to taper down to 1/2 tab depending on his work-up with Dr. [name].  BLE Edema: He notes swelling for several months now - we added HCTZ 25mg  at last visit he can't tell if this has truly helped the swelling or not.  Legs are fine in the morning when he first gets up.  The swelling occurs when he forgets to elevate his legs at night/while resting, throughout the day as he is up and walking around.  Has some compression socks at home that he used to wear, he is open to starting to wear these again.  Denies chest pain or shortness of breath, no palpitations.  Declines referral to vein and vascular at this time, wants to try compression socks first.  We discussed removing amlodipine from his daily HTN regimen, however recent BP's have been quite elevated, so we will keep current regimen for the time being.  HTN: BP had been increasing since he has been weaning himself off of his Clonazepam and Norco; however today he is at goal today.  He is taking losartan 100mg , amlodipine 10mg , metoprolol 125mg ; we added HCTZ 25mg  and BG today is at goal. Has been very compliant with his medications.  He used to take lisinopril, but developed chronic cough - cough stopped after he stopped taking the medication.  He has never been diagnosed with HF; he denies shortness of breath or chest pain, no palpitations, no headaches, lightheadedness, dizziness.  Diabetes:  In the last week Fasting BG's have been running >200 - highest 217, lowest 200.  Normally, it is in the 150's.   He did come off of clonazepam and Norco and was told that weaning off of these may increase his BP and his glucose levels.  Currently taking Metformin 1000mg  BID and 5mg  Glipizide QAM.  We will stop glipizide today and start Jardiance 10mg  QAM.  No changes in diet - does not follow a diabetic diet typically, but tries not to over-indulge; has gone to diabetic classes in the past.  He denies hypoglycemic episodes.  Denies polyphagia, polyuria, or polydipsia.  Has plans to go for exam - will fax results to our clinic. Foot exam due today.  Depression and Anxiety: He stopped taking his clonazepam and prozac on 03/10/2017 because his PCP is leaving and he did not want to be referred to psychiatry.  Restarted prozac, did well restarting, but says his previous dose of 40mg  worked best and he would like to titrate up to this dosing - we will order this today.  He just started gabapentin yesterday after seeing Pain management for the first time, and he would like to see how this works for him before considering additional medications for anxiety.  Patient Active Problem List   Diagnosis Date Noted  . Pneumonia 07/19/2016  . BPH without urinary obstruction 06/03/2015  . Depression 03/05/2015  . Hyperlipidemia 09/03/2014  . Major depressive disorder, recurrent episode, moderate (  [facility]) 09/03/2014  . Chronic low back pain 09/03/2014  . Arthralgia of both knees 09/03/2014  . Type 2 diabetes mellitus without complication ([place]) 96/[id number]/9381  . Allergic rhinitis 07/04/2014  . Anxiety 07/04/2014  . Continuous opioid dependence ([place]) 07/04/2014  . DDD (degenerative disc disease), lumbar 07/04/2014  . Gout of foot 07/04/2014  . Essential hypertension 07/04/2014  . Flu vaccine need 07/04/2014  . Adiposity 07/04/2014  . OA (osteoarthritis) of knee 07/04/2014  . Neuralgia neuritis, sciatic nerve 07/04/2014  . Arthralgia of multiple joints 07/04/2014  . H/O malignant neoplasm of skin 12/24/2013    Past Surgical  History:  Procedure Laterality Date  . ARTHROSCOPIC REPAIR ACL Left   . CYSTOSCOPY W/ URETERAL STENT PLACEMENT Right 09/07/2014   Procedure: CYSTOSCOPY WITH STENT REPLACEMENT;  Surgeon: Hollice Espy, MD;  Location: [facility];  Service: Urology;  Laterality: Right;  . CYSTOSCOPY/URETEROSCOPY/HOLMIUM LASER/STENT PLACEMENT Right 08/17/2014   Procedure: CYSTOSCOPY/URETEROSCOPY//STENT PLACEMENT/ attempt of lithotripsy;  Surgeon: Hollice Espy, MD;  Location: [facility];  Service: Urology;  Laterality: Right;  . HERNIA REPAIR    . MOHS SURGERY     lip  . TOTAL KNEE ARTHROPLASTY Left 04/24/2016   Procedure: LEFT TOTAL KNEE ARTHROPLASTY;  Surgeon: Gaynelle Arabian, MD;  Location: [facility];  Service: Orthopedics;  Laterality: Left;  Adductor Block  . TOTAL KNEE ARTHROPLASTY Right 10/23/2016   Procedure: RIGHT TOTAL KNEE ARTHROPLASTY;  Surgeon: Gaynelle Arabian, MD;  Location: [facility];  Service: Orthopedics;  Laterality: Right;  . URETEROSCOPY WITH HOLMIUM LASER LITHOTRIPSY Right 09/07/2014   Procedure: URETEROSCOPY WITH HOLMIUM LASER LITHOTRIPSY;  Surgeon: Hollice Espy, MD;  Location: [facility];  Service: Urology;  Laterality: Right;  [name] VASECTOMY      Family History  Problem Relation Age of Onset  . Hypertension Mother   . Cancer Mother   . Stroke Father   . Depression Sister     Social History   Socioeconomic History  . Marital status: Married    Spouse name: Not on file  . Number of children: Not on file  . Years of education: Not on file  . Highest education level: Not on file  Social Needs  . Financial resource strain: Not on file  . Food insecurity - worry: Not on file  . Food insecurity - inability: Not on file  . Transportation needs - medical: Not on file  . Transportation needs - non-medical: Not on file  Occupational History  . Not on file  Tobacco Use  . Smoking status: Never Smoker  . Smokeless tobacco: Never Used  Substance and Sexual Activity  . Alcohol use: No  . Drug use: No   . Sexual activity: Yes  Other Topics Concern  . Not on file  Social History Narrative  . Not on file     Current Outpatient Medications:  .  allopurinol (ZYLOPRIM) 300 MG tablet, Take 1 tablet (300 mg total) by mouth every evening., Disp: 90 tablet, Rfl: 1 .  amLODipine (NORVASC) 10 MG tablet, Take 1 tablet (10 mg total) by mouth daily., Disp: 90 tablet, Rfl: 1 .  ASPIRIN 81 PO, Take by mouth., Disp: , Rfl:  .  atorvastatin (LIPITOR) 40 MG tablet, TAKE 1 TABLET AT BEDTIME, Disp: 90 tablet, Rfl: 0 .  fluticasone (FLONASE) 50 MCG/ACT nasal spray, Place 2 sprays into both nostrils daily. 2 sprays, Disp: 16 g, Rfl: 1 .  gabapentin (NEURONTIN) 300 MG capsule, Take 1 capsule (300 mg total) by mouth at bedtime., Disp: 30 capsule,  Rfl: 1 .  glucose blood (ACCU-CHEK AVIVA PLUS) test strip, Use as instructed, Disp: 100 each, Rfl: 12 .  hydrochlorothiazide (HYDRODIURIL) 25 MG tablet, Take 1 tablet (25 mg total) by mouth daily., Disp: 90 tablet, Rfl: 1 .  HYDROcodone-acetaminophen (NORCO) 10-325 MG tablet, Take 1 tablet by mouth every 6 (six) hours as needed., Disp: 30 tablet, Rfl: 0 .  losartan (COZAAR) 100 MG tablet, Take 1 tablet (100 mg total) by mouth at bedtime., Disp: 90 tablet, Rfl: 1 .  metFORMIN (GLUCOPHAGE-XR) 500 MG 24 hr tablet, Take 2 tablets (1,000 mg total) by mouth 2 (two) times daily. 1000mg  BID, Disp: 360 tablet, Rfl: 1 .  methocarbamol (ROBAXIN) 500 MG tablet, Take 1 tablet (500 mg total) by mouth every 6 (six) hours as needed for muscle spasms., Disp: 80 tablet, Rfl: 0 .  metoprolol succinate (TOPROL-XL) 100 MG 24 hr tablet, Take 1 tablet (100 mg total) by mouth every morning., Disp: 90 tablet, Rfl: 1 .  metoprolol succinate (TOPROL-XL) 25 MG 24 hr tablet, Take 1 tablet (25 mg total) by mouth every morning., Disp: 90 tablet, Rfl: 1 .  nabumetone (RELAFEN) 500 MG tablet, Take 1 tablet (500 mg total) by mouth 2 (two) times daily., Disp: 180 tablet, Rfl: 0 .  rivaroxaban (XARELTO) 10  MG TABS tablet, Take 1 tablet (10 mg total) by mouth daily with breakfast. Take Xarelto for two and a half more weeks following discharge from the hospital, then discontinue Xarelto. Once the patient has completed the Xarelto, they may resume the 81 mg Aspirin., Disp: 19 tablet, Rfl: 0 .  tamsulosin (FLOMAX) 0.4 MG CAPS capsule, Take 1 capsule (0.4 mg total) by mouth daily after supper., Disp: 90 capsule, Rfl: 1 .  tiZANidine (ZANAFLEX) 4 MG tablet, Take 1 tablet (4 mg total) by mouth at bedtime., Disp: 90 tablet, Rfl: 0 .  empagliflozin (JARDIANCE) 10 MG TABS tablet, Take 10 mg by mouth daily., Disp: 45 tablet, Rfl: 0 .  FLUoxetine (PROZAC) 40 MG capsule, Take 1 capsule (40 mg total) by mouth daily., Disp: 90 capsule, Rfl: 1  Allergies  Allergen Reactions  . Celebrex  [Celecoxib]     GI  . Tetanus Toxoids Other (See Comments)    unkown  . Tetanus-Diphtheria Toxoids Td     Other reaction(s): Unknown    ROS Constitutional: Negative for fever or weight change.  Respiratory: Negative for cough and shortness of breath.   Cardiovascular: Negative for chest pain or palpitations.  Gastrointestinal: Negative for abdominal pain, no bowel changes.  Musculoskeletal: Negative for gait problem or joint swelling.  Skin: Negative for rash.  Neurological: Negative for dizziness or headache.  No other specific complaints in a complete review of systems (except as listed in HPI above).  Objective  Vitals:   05/08/17 0804  BP: 132/68  Pulse: 91  Resp: 18  Temp: 98.5 F (36.9 C)  TempSrc: Oral  SpO2: 94%  Weight: 270 lb 4.8 oz (122.6 kg)  Height: 6\' 2"  (1.88 m)   Body mass index is 34.7 kg/m.  Physical Exam Constitutional: Patient appears well-developed and well-nourished. No distress.  HENT: Head: Normocephalic and atraumatic.  Nose: Nose normal. Mouth/Throat: Oropharynx is clear and moist. No oropharyngeal exudate.  Eyes: Conjunctivae and EOM are normal. Pupils are equal, round, and  reactive to light. No scleral icterus.  Neck: Normal range of motion. Neck supple. No JVD present. Cardiovascular: Normal rate, regular rhythm and normal heart sounds.  No murmur heard. +1 pitting edema to  BLE. Pulmonary/Chest: Effort normal and breath sounds normal. No respiratory distress. Musculoskeletal: Normal range of motion, no joint effusions. No gross deformities Neurological: he is alert and oriented to person, place, and time. No cranial nerve deficit. Coordination, balance, strength, speech and gait are normal.  Skin: Skin is warm and dry. No rash noted. No erythema.  Psychiatric: Patient has a normal mood and affect. behavior is normal. Judgment and thought content normal.  No results found for this or any previous visit (from the past 72 hour(s)).  Diabetic Foot Exam: Diabetic Foot Exam - Simple   Simple Foot Form Diabetic Foot exam was performed with the following findings:  Yes 05/08/2017  8:41 AM  Visual Inspection No deformities, no ulcerations, no other skin breakdown bilaterally:  Yes Sensation Testing Intact to touch and monofilament testing bilaterally:  Yes Pulse Check Posterior Tibialis and Dorsalis pulse intact bilaterally:  Yes Comments    PHQ2/9: Depression screen [facility] 2/9 05/07/2017 03/17/2017 01/19/2017 12/12/2016 09/11/2016  Decreased Interest 0 0 0 0 0  Down, Depressed, Hopeless 0 0 0 0 0  PHQ - 2 Score 0 0 0 0 0   Fall Risk: Fall Risk  05/07/2017 03/17/2017 01/19/2017 12/12/2016 09/11/2016  Falls in the past year? Yes No No No No  Number falls in past yr: 1 - - - -   Assessment & Plan  1. Essential hypertension - Continue daily medications as prescribed.  Will consider removing Amlodipine at next visit is BLE edema persists. - hydrochlorothiazide (HYDRODIURIL) 25 MG tablet; Take 1 tablet (25 mg total) by mouth daily.  Dispense: 90 tablet; Refill: 1  2. Type 2 diabetes mellitus without complication, without long-term current use of insulin (HCC) -  empagliflozin (JARDIANCE) 25 MG TABS tablet; Take 25 mg by mouth daily.  Dispense: 45 tablet; Refill: 0 - Stop glipizide.  Call if BG >250 or if polydipsia, polyphagia, or polyuria - Schedule eye examination  3. Bilateral lower extremity edema - Continue daily medications as prescribed.  Will consider removing Amlodipine at next visit is BLE edema persists. - Elevate legs when resting, wear compression stockings - follow up in 2 weeks if not improving and we will refer to vein and vascular (declines referral today) - hydrochlorothiazide (HYDRODIURIL) 25 MG tablet; Take 1 tablet (25 mg total) by mouth daily.  Dispense: 90 tablet; Refill: 1  4. Major depressive disorder, recurrent episode, moderate (HCC) - FLUoxetine (PROZAC) 40 MG capsule; Take 1 capsule (40 mg total) by mouth daily.  Dispense: 90 capsule; Refill: 1  5. Anxiety - FLUoxetine (PROZAC) 40 MG capsule; Take 1 capsule (40 mg total) by mouth daily.  Dispense: 90 capsule; Refill: 1  6. Continuous opioid dependence ([place]) 7. Chronic bilateral low back pain without sciatica Continue follow up with Dr. [name]  -[place] Maintenance: Foot exam performed today; will schedule eye exam.

## 2017-05-08 NOTE — Patient Instructions (Addendum)
Please schedule your eye examination. Start wearing compression socks and remember to elevate feet when resting.  DASH Eating Plan DASH stands for "Dietary Approaches to Stop Hypertension." The DASH eating plan is a healthy eating plan that has been shown to reduce high blood pressure (hypertension). It may also reduce your risk for type 2 diabetes, heart disease, and stroke. The DASH eating plan may also help with weight loss. What are tips for following this plan? General guidelines  Avoid eating more than 2,300 mg (milligrams) of salt (sodium) a day. If you have hypertension, you may need to reduce your sodium intake to 1,500 mg a day.  Limit alcohol intake to no more than 1 drink a day for nonpregnant women and 2 drinks a day for men. One drink equals 12 oz of beer, 5 oz of wine, or 1 oz of hard liquor.  Work with your health care provider to maintain a healthy body weight or to lose weight. Ask what an ideal weight is for you.  Get at least 30 minutes of exercise that causes your heart to beat faster (aerobic exercise) most days of the week. Activities may include walking, swimming, or biking.  Work with your health care provider or diet and nutrition specialist (dietitian) to adjust your eating plan to your individual calorie needs. Reading food labels  Check food labels for the amount of sodium per serving. Choose foods with less than 5 percent of the Daily Value of sodium. Generally, foods with less than 300 mg of sodium per serving fit into this eating plan.  To find whole grains, look for the word "whole" as the first word in the ingredient list. Shopping  Buy products labeled as "low-sodium" or "no salt added."  Buy fresh foods. Avoid canned foods and premade or frozen meals. Cooking  Avoid adding salt when cooking. Use salt-free seasonings or herbs instead of table salt or sea salt. Check with your health care provider or pharmacist before using salt substitutes.  Do not  fry foods. Cook foods using healthy methods such as baking, boiling, grilling, and broiling instead.  Cook with heart-healthy oils, such as olive, canola, soybean, or sunflower oil. Meal planning   Eat a balanced diet that includes: ? 5 or more servings of fruits and vegetables each day. At each meal, try to fill half of your plate with fruits and vegetables. ? Up to 6-8 servings of whole grains each day. ? Less than 6 oz of lean meat, poultry, or fish each day. A 3-oz serving of meat is about the same size as a deck of cards. One egg equals 1 oz. ? 2 servings of low-fat dairy each day. ? A serving of nuts, seeds, or beans 5 times each week. ? Heart-healthy fats. Healthy fats called Omega-3 fatty acids are found in foods such as flaxseeds and coldwater fish, like sardines, salmon, and mackerel.  Limit how much you eat of the following: ? Canned or prepackaged foods. ? Food that is high in trans fat, such as fried foods. ? Food that is high in saturated fat, such as fatty meat. ? Sweets, desserts, sugary drinks, and other foods with added sugar. ? Full-fat dairy products.  Do not salt foods before eating.  Try to eat at least 2 vegetarian meals each week.  Eat more home-cooked food and less restaurant, buffet, and fast food.  When eating at a restaurant, ask that your food be prepared with less salt or no salt, if possible. What foods  are recommended? The items listed may not be a complete list. Talk with your dietitian about what dietary choices are best for you. Grains Whole-grain or whole-wheat bread. Whole-grain or whole-wheat pasta. Brown rice. [place]. Bulgur. Whole-grain and low-sodium cereals. Pita bread. Low-fat, low-sodium crackers. Whole-wheat flour tortillas. Vegetables Fresh or frozen vegetables (raw, steamed, roasted, or grilled). Low-sodium or reduced-sodium tomato and vegetable juice. Low-sodium or reduced-sodium tomato sauce and tomato paste. Low-sodium or  reduced-sodium canned vegetables. Fruits All fresh, dried, or frozen fruit. Canned fruit in natural juice (without added sugar). Meat and other protein foods Skinless chicken or [place]. Ground chicken or [place]. Pork with fat trimmed off. Fish and seafood. Egg whites. Dried beans, peas, or lentils. Unsalted nuts, nut butters, and seeds. Unsalted canned beans. Lean cuts of beef with fat trimmed off. Low-sodium, lean deli meat. Dairy Low-fat (1%) or fat-free (skim) milk. Fat-free, low-fat, or reduced-fat cheeses. Nonfat, low-sodium ricotta or cottage cheese. Low-fat or nonfat yogurt. Low-fat, low-sodium cheese. Fats and oils Soft margarine without trans fats. Vegetable oil. Low-fat, reduced-fat, or light mayonnaise and salad dressings (reduced-sodium). Canola, safflower, olive, soybean, and sunflower oils. Avocado. Seasoning and other foods Herbs. Spices. Seasoning mixes without salt. Unsalted popcorn and pretzels. Fat-free sweets. What foods are not recommended? The items listed may not be a complete list. Talk with your dietitian about what dietary choices are best for you. Grains Baked goods made with fat, such as croissants, muffins, or some breads. Dry pasta or rice meal packs. Vegetables Creamed or fried vegetables. Vegetables in a cheese sauce. Regular canned vegetables (not low-sodium or reduced-sodium). Regular canned tomato sauce and paste (not low-sodium or reduced-sodium). Regular tomato and vegetable juice (not low-sodium or reduced-sodium). Angie Fava. Olives. Fruits Canned fruit in a light or heavy syrup. Fried fruit. Fruit in cream or butter sauce. Meat and other protein foods Fatty cuts of meat. Ribs. Fried meat. Berniece Salines. Sausage. Bologna and other processed lunch meats. Salami. Fatback. Hotdogs. Bratwurst. Salted nuts and seeds. Canned beans with added salt. Canned or smoked fish. Whole eggs or egg yolks. Chicken or [place] with skin. Dairy Whole or 2% milk, cream, and half-and-half.  Whole or full-fat cream cheese. Whole-fat or sweetened yogurt. Full-fat cheese. Nondairy creamers. Whipped toppings. Processed cheese and cheese spreads. Fats and oils Butter. Stick margarine. Lard. Shortening. Ghee. Bacon fat. Tropical oils, such as coconut, palm kernel, or palm oil. Seasoning and other foods Salted popcorn and pretzels. Onion salt, garlic salt, seasoned salt, table salt, and sea salt. Worcestershire sauce. Tartar sauce. Barbecue sauce. Teriyaki sauce. Soy sauce, including reduced-sodium. Steak sauce. Canned and packaged gravies. Fish sauce. Oyster sauce. Cocktail sauce. Horseradish that you find on the shelf. Ketchup. Mustard. Meat flavorings and tenderizers. Bouillon cubes. Hot sauce and Tabasco sauce. Premade or packaged marinades. Premade or packaged taco seasonings. Relishes. Regular salad dressings. Where to find more information:  National Heart, Lung, and [place]: https://wilson-eaton.com/  American Heart Association: www.heart.org Summary  The DASH eating plan is a healthy eating plan that has been shown to reduce high blood pressure (hypertension). It may also reduce your risk for type 2 diabetes, heart disease, and stroke.  With the DASH eating plan, you should limit salt (sodium) intake to 2,300 mg a day. If you have hypertension, you may need to reduce your sodium intake to 1,500 mg a day.  When on the DASH eating plan, aim to eat more fresh fruits and vegetables, whole grains, lean proteins, low-fat dairy, and heart-healthy fats.  Work with your  health care provider or diet and nutrition specialist (dietitian) to adjust your eating plan to your individual calorie needs. This information is not intended to replace advice given to you by your health care provider. Make sure you discuss any questions you have with your health care provider. Document Released: 03/09/2011 Document Revised: 03/13/2016 Document Reviewed: 03/13/2016 Elsevier Interactive Patient Education   2018 [employer].   Diabetes Mellitus and Nutrition When you have diabetes (diabetes mellitus), it is very important to have healthy eating habits because your blood sugar (glucose) levels are greatly affected by what you eat and drink. Eating healthy foods in the appropriate amounts, at about the same times every day, can help you:  Control your blood glucose.  Lower your risk of heart disease.  Improve your blood pressure.  Reach or maintain a healthy weight.  Every person with diabetes is different, and each person has different needs for a meal plan. Your health care provider may recommend that you work with a diet and nutrition specialist (dietitian) to make a meal plan that is best for you. Your meal plan may vary depending on factors such as:  The calories you need.  The medicines you take.  Your weight.  Your blood glucose, blood pressure, and cholesterol levels.  Your activity level.  Other health conditions you have, such as heart or kidney disease.  How do carbohydrates affect me? Carbohydrates affect your blood glucose level more than any other type of food. Eating carbohydrates naturally increases the amount of glucose in your blood. Carbohydrate counting is a method for keeping track of how many carbohydrates you eat. Counting carbohydrates is important to keep your blood glucose at a healthy level, especially if you use insulin or take certain oral diabetes medicines. It is important to know how many carbohydrates you can safely have in each meal. This is different for every person. Your dietitian can help you calculate how many carbohydrates you should have at each meal and for snack. Foods that contain carbohydrates include:  Bread, cereal, rice, pasta, and crackers.  Potatoes and corn.  Peas, beans, and lentils.  Milk and yogurt.  Fruit and juice.  Desserts, such as cakes, cookies, ice cream, and candy.  How does alcohol affect me? Alcohol can cause  a sudden decrease in blood glucose (hypoglycemia), especially if you use insulin or take certain oral diabetes medicines. Hypoglycemia can be a life-threatening condition. Symptoms of hypoglycemia (sleepiness, dizziness, and confusion) are similar to symptoms of having too much alcohol. If your health care provider says that alcohol is safe for you, follow these guidelines:  Limit alcohol intake to no more than 1 drink per day for nonpregnant women and 2 drinks per day for men. One drink equals 12 oz of beer, 5 oz of wine, or 1 oz of hard liquor.  Do not drink on an empty stomach.  Keep yourself hydrated with water, diet soda, or unsweetened iced tea.  Keep in mind that regular soda, juice, and other mixers may contain a lot of sugar and must be counted as carbohydrates.  What are tips for following this plan? Reading food labels  Start by checking the serving size on the label. The amount of calories, carbohydrates, fats, and other nutrients listed on the label are based on one serving of the food. Many foods contain more than one serving per package.  Check the total grams (g) of carbohydrates in one serving. You can calculate the number of servings of carbohydrates in one  serving by dividing the total carbohydrates by 15. For example, if a food has 30 g of total carbohydrates, it would be equal to 2 servings of carbohydrates.  Check the number of grams (g) of saturated and trans fats in one serving. Choose foods that have low or no amount of these fats.  Check the number of milligrams (mg) of sodium in one serving. Most people should limit total sodium intake to less than 2,300 mg per day.  Always check the nutrition information of foods labeled as "low-fat" or "nonfat". These foods may be higher in added sugar or refined carbohydrates and should be avoided.  Talk to your dietitian to identify your daily goals for nutrients listed on the label. Shopping  Avoid buying canned, premade,  or processed foods. These foods tend to be high in fat, sodium, and added sugar.  Shop around the outside edge of the grocery store. This includes fresh fruits and vegetables, bulk grains, fresh meats, and fresh dairy. Cooking  Use low-heat cooking methods, such as baking, instead of high-heat cooking methods like deep frying.  Cook using healthy oils, such as olive, canola, or sunflower oil.  Avoid cooking with butter, cream, or high-fat meats. Meal planning  Eat meals and snacks regularly, preferably at the same times every day. Avoid going long periods of time without eating.  Eat foods high in fiber, such as fresh fruits, vegetables, beans, and whole grains. Talk to your dietitian about how many servings of carbohydrates you can eat at each meal.  Eat 4-6 ounces of lean protein each day, such as lean meat, chicken, fish, eggs, or tofu. 1 ounce is equal to 1 ounce of meat, chicken, or fish, 1 egg, or 1/4 cup of tofu.  Eat some foods each day that contain healthy fats, such as avocado, nuts, seeds, and fish. Lifestyle   Check your blood glucose regularly.  Exercise at least 30 minutes 5 or more days each week, or as told by your health care provider.  Take medicines as told by your health care provider.  Do not use any products that contain nicotine or tobacco, such as cigarettes and e-cigarettes. If you need help quitting, ask your health care provider.  Work with a Social worker or diabetes educator to identify strategies to manage stress and any emotional and social challenges. What are some questions to ask my health care provider?  Do I need to meet with a diabetes educator?  Do I need to meet with a dietitian?  What number can I call if I have questions?  When are the best times to check my blood glucose? Where to find more information:  American Diabetes Association: diabetes.org/food-and-fitness/food  Academy of Nutrition and Dietetics:  PokerClues.dk  [employer] of Diabetes and Digestive and Kidney Diseases (NIH): ContactWire.be Summary  A healthy meal plan will help you control your blood glucose and maintain a healthy lifestyle.  Working with a diet and nutrition specialist (dietitian) can help you make a meal plan that is best for you.  Keep in mind that carbohydrates and alcohol have immediate effects on your blood glucose levels. It is important to count carbohydrates and to use alcohol carefully. This information is not intended to replace advice given to you by your health care provider. Make sure you discuss any questions you have with your health care provider. Document Released: 12/15/2004 Document Revised: 04/24/2016 Document Reviewed: 04/24/2016 Elsevier Interactive Patient Education  [employer].

## 2017-05-10 ENCOUNTER — Telehealth: Payer: Self-pay | Admitting: Family Medicine

## 2017-05-10 DIAGNOSIS — E119 Type 2 diabetes mellitus without complications: Secondary | ICD-10-CM

## 2017-05-10 NOTE — Telephone Encounter (Signed)
Copied from [place]. Topic: Quick Communication - See Telephone Encounter >> May 10, 2017  3:49 PM Cleaster Corin, NT wrote: CRM for notification. See Telephone encounter for:   05/10/17. Pt. Calling about med. Jardiance went to pick it up at the pharmacy and it was $94.00 pt. Corrin Parker to know if something else could be called in for him. Pt. Can be reached at [place], [place] - [place]  [place] [address]  Phone: [phone] Fax: [phone]

## 2017-05-11 LAB — COMPLIANCE DRUG ANALYSIS, UR

## 2017-05-11 MED ORDER — GLIPIZIDE 5 MG PO TABS: 5.0000 mg | ORAL_TABLET | Freq: Every day | ORAL | 0 refills | Status: DC

## 2017-05-11 NOTE — Telephone Encounter (Signed)
Patient notified. Stated he has enough Glipizide to last until refill come from [facility]. Will call if he run out for refill

## 2017-05-11 NOTE — Telephone Encounter (Signed)
Please advise 

## 2017-05-11 NOTE — Telephone Encounter (Signed)
Reviewed case with [name], and at this time, I recommend he continue the Metformin 1000mg  BID and Glipizide 5mg  due to affordability of medications. He needs to proceed with an intense focus on eating a  strict diabetic diet - avoid all processed sugars, significantly reduce carbohydrate intake, eat lean proteins that are baked/grilled/steamed and are never fried, eat plenty of fresh vegetables; exercise as tolerated.  I sent refill of glipizide to [facility] - if he needs short-term supply to [facility], let me know.

## 2017-05-22 ENCOUNTER — Other Ambulatory Visit: Payer: Self-pay

## 2017-05-22 ENCOUNTER — Ambulatory Visit
Payer: Medicare PPO | Attending: Student in an Organized Health Care Education/Training Program | Admitting: Student in an Organized Health Care Education/Training Program

## 2017-05-22 ENCOUNTER — Encounter: Payer: Self-pay | Admitting: Student in an Organized Health Care Education/Training Program

## 2017-05-22 VITALS — BP 166/96 | HR 70 | Temp 98.3°F | Resp 16 | Ht 75.0 in | Wt 269.0 lb

## 2017-05-22 DIAGNOSIS — Z79899 Other long term (current) drug therapy: Secondary | ICD-10-CM | POA: Insufficient documentation

## 2017-05-22 DIAGNOSIS — Z7984 Long term (current) use of oral hypoglycemic drugs: Secondary | ICD-10-CM | POA: Diagnosis not present

## 2017-05-22 DIAGNOSIS — K219 Gastro-esophageal reflux disease without esophagitis: Secondary | ICD-10-CM | POA: Diagnosis not present

## 2017-05-22 DIAGNOSIS — F1120 Opioid dependence, uncomplicated: Secondary | ICD-10-CM | POA: Diagnosis not present

## 2017-05-22 DIAGNOSIS — E669 Obesity, unspecified: Secondary | ICD-10-CM | POA: Insufficient documentation

## 2017-05-22 DIAGNOSIS — M25561 Pain in right knee: Secondary | ICD-10-CM | POA: Diagnosis not present

## 2017-05-22 DIAGNOSIS — M47816 Spondylosis without myelopathy or radiculopathy, lumbar region: Secondary | ICD-10-CM

## 2017-05-22 DIAGNOSIS — M25562 Pain in left knee: Secondary | ICD-10-CM | POA: Diagnosis not present

## 2017-05-22 DIAGNOSIS — I10 Essential (primary) hypertension: Secondary | ICD-10-CM | POA: Diagnosis not present

## 2017-05-22 DIAGNOSIS — F419 Anxiety disorder, unspecified: Secondary | ICD-10-CM | POA: Diagnosis not present

## 2017-05-22 DIAGNOSIS — G894 Chronic pain syndrome: Secondary | ICD-10-CM

## 2017-05-22 DIAGNOSIS — Z7982 Long term (current) use of aspirin: Secondary | ICD-10-CM | POA: Diagnosis not present

## 2017-05-22 DIAGNOSIS — M545 Low back pain, unspecified: Secondary | ICD-10-CM

## 2017-05-22 DIAGNOSIS — E119 Type 2 diabetes mellitus without complications: Secondary | ICD-10-CM | POA: Insufficient documentation

## 2017-05-22 DIAGNOSIS — G8929 Other chronic pain: Secondary | ICD-10-CM

## 2017-05-22 DIAGNOSIS — Z96653 Presence of artificial knee joint, bilateral: Secondary | ICD-10-CM

## 2017-05-22 DIAGNOSIS — M109 Gout, unspecified: Secondary | ICD-10-CM | POA: Insufficient documentation

## 2017-05-22 DIAGNOSIS — M5136 Other intervertebral disc degeneration, lumbar region: Secondary | ICD-10-CM | POA: Insufficient documentation

## 2017-05-22 DIAGNOSIS — N400 Enlarged prostate without lower urinary tract symptoms: Secondary | ICD-10-CM | POA: Insufficient documentation

## 2017-05-22 DIAGNOSIS — F331 Major depressive disorder, recurrent, moderate: Secondary | ICD-10-CM | POA: Insufficient documentation

## 2017-05-22 DIAGNOSIS — E785 Hyperlipidemia, unspecified: Secondary | ICD-10-CM | POA: Insufficient documentation

## 2017-05-22 DIAGNOSIS — M170 Bilateral primary osteoarthritis of knee: Secondary | ICD-10-CM

## 2017-05-22 DIAGNOSIS — Z85828 Personal history of other malignant neoplasm of skin: Secondary | ICD-10-CM | POA: Diagnosis not present

## 2017-05-22 DIAGNOSIS — J309 Allergic rhinitis, unspecified: Secondary | ICD-10-CM | POA: Insufficient documentation

## 2017-05-22 DIAGNOSIS — M51369 Other intervertebral disc degeneration, lumbar region without mention of lumbar back pain or lower extremity pain: Secondary | ICD-10-CM

## 2017-05-22 MED ORDER — GABAPENTIN 300 MG PO CAPS: 300.0000 mg | ORAL_CAPSULE | Freq: Three times a day (TID) | ORAL | 1 refills | Status: DC

## 2017-05-22 MED ORDER — HYDROCODONE-ACETAMINOPHEN 10-325 MG PO TABS: 1.0000 | ORAL_TABLET | Freq: Two times a day (BID) | ORAL | 0 refills | Status: DC | PRN

## 2017-05-22 NOTE — Progress Notes (Signed)
Safety precautions to be maintained throughout the outpatient stay will include: orient to surroundings, keep bed in low position, maintain call bell within reach at all times, provide assistance with transfer out of bed and ambulation.  

## 2017-05-22 NOTE — Patient Instructions (Addendum)
1. Increase Gabapentin 300 mg twice daily for 2 weeks then 300 mg three times a day if no SE 2. Rx for Hydrocodone 3. Sign opioid contract 4. 2 month f./u  You have been given 2 Rx for Hydrocodone/APAP to last until 07/21/2017. Rx for gabapentin has been escribed to [facility]/[place].

## 2017-05-22 NOTE — Progress Notes (Signed)
Patient's Name: Evan Rivas  MRN: [id number]  Referring Provider: Hubbard Hartshorn, FNP  DOB: 1950-08-12  PCP: Hubbard Hartshorn, FNP  DOS: 05/22/2017  Note by: Gillis Santa, MD  Service setting: Ambulatory outpatient  Specialty: Interventional Pain Management  Location: [facility] (AMB) Pain Management Facility    Patient type: Established   Primary Reason(s) for Visit: Encounter for evaluation before starting new chronic pain management plan of care (Level of risk: moderate) CC: Knee Pain (bilaterally); Back Pain (lower); and Hip Pain (right)  HPI  [name] is a 67 y.o. year old, male patient, who comes today for a follow-up evaluation to review the test results and decide on a treatment plan. He has Allergic rhinitis; Anxiety; Continuous opioid dependence ([place]); DDD (degenerative disc disease), lumbar; Gout of foot; Essential hypertension; Flu vaccine need; Adiposity; OA (osteoarthritis) of knee; Neuralgia neuritis, sciatic nerve; Arthralgia of multiple joints; H/O malignant neoplasm of skin; Hyperlipidemia; Major depressive disorder, recurrent episode, moderate ([place]); Chronic low back pain; Arthralgia of both knees; Type 2 diabetes mellitus without complication ([place]); Depression; BPH without urinary obstruction; and Pneumonia on their problem list. His primarily concern today is the Knee Pain (bilaterally); Back Pain (lower); and Hip Pain (right)  Pain Assessment: Location: Lower Back Radiating: denies Onset: More than a month ago Duration: Chronic pain Quality: Throbbing, Constant Severity: 4 /10 (self-reported pain score)  Note: Reported level is compatible with observation.                         When using our objective Pain Scale, levels between 6 and 10/10 are said to belong in an emergency room, as it progressively worsens from a 6/10, described as severely limiting, requiring emergency care not usually available at an outpatient pain management facility. At a 6/10 level, communication  becomes difficult and requires great effort. Assistance to reach the emergency department may be required. Facial flushing and profuse sweating along with potentially dangerous increases in heart rate and blood pressure will be evident. Effect on ADL: a constant distraction Timing: Constant Modifying factors: knee replacement  X 2 has helped some  [name] comes in today for a follow-up visit after his initial evaluation on 05/07/2017. Today we went over the results of his tests. These were explained in "[place]'s terms". During today's appointment we went over my diagnostic impression, as well as the proposed treatment plan.  Presents for follow-up status post lumbar spine x-rays and SI joint x-rays bilaterally.  UDS completed and appropriate.  Patient on gabapentin 300 mg qhs.  Does not find any side effects associated with this medication.  Endorses some benefit initially.  In considering the treatment plan options, Mr. Dejaynes was reminded that I no longer take patients for medication management only. I asked him to let me know if he had no intention of taking advantage of the interventional therapies, so that we could make arrangements to provide this space to someone interested. I also made it clear that undergoing interventional therapies for the purpose of getting pain medications is very inappropriate on the part of a patient, and it will not be tolerated in this practice. This type of behavior would suggest true addiction and therefore it requires referral to an addiction specialist.   Further details on both, my assessment(s), as well as the proposed treatment plan, please see below.  Controlled Substance Pharmacotherapy Assessment REMS (Risk Evaluation and Mitigation Strategy)  Analgesic: Hydrocodone 10 mg daily MME/day: 10 mg/day. Pill  Count: None expected due to no prior prescriptions written by our practice. Rise Patience  05/22/2017  8:50 AM  Sign at close encounter Safety  precautions to be maintained throughout the outpatient stay will include: orient to surroundings, keep bed in low position, maintain call bell within reach at all times, provide assistance with transfer out of bed and ambulation.    Pharmacokinetics: Liberation and absorption (onset of action): WNL Distribution (time to peak effect): WNL Metabolism and excretion (duration of action): WNL         Pharmacodynamics: Desired effects: Analgesia: Mr. Ciavarella reports >50% benefit. Functional ability: Patient reports that medication allows him to accomplish basic ADLs Clinically meaningful improvement in function (CMIF): Sustained CMIF goals met Perceived effectiveness: Described as relatively effective, allowing for increase in activities of daily living (ADL) Undesirable effects: Side-effects or Adverse reactions: None reported Monitoring: [place] PMP: Online review of the past 41-monthperiod previously conducted. Not applicable at this point since we have not taken over the patient's medication management yet. List of other Serum/Urine Drug Screening Test(s):  Lab Results  Component Value Date   COCAINSCRNUR Negative 08/05/2015   CANNABQUANT Negative 08/05/2015   List of all UDS test(s) done:  Lab Results  Component Value Date   SUMMARY FINAL 05/07/2017   Last UDS on record: Summary  Date Value Ref Range Status  05/07/2017 FINAL  Final    Comment:    ==================================================================== TOXASSURE COMP DRUG ANALYSIS,UR ==================================================================== Test                             Result       Flag       Units Drug Present and Declared for Prescription Verification   Hydrocodone                    396          EXPECTED   ng/mg creat   Hydromorphone                  78           EXPECTED   ng/mg creat   Dihydrocodeine                 213          EXPECTED   ng/mg creat   Norhydrocodone                 814           EXPECTED   ng/mg creat    Sources of hydrocodone include scheduled prescription    medications. Hydromorphone, dihydrocodeine and norhydrocodone are    expected metabolites of hydrocodone. Hydromorphone and    dihydrocodeine are also available as scheduled prescription    medications.   Fluoxetine                     PRESENT      EXPECTED   Norfluoxetine                  PRESENT      EXPECTED    Norfluoxetine is an expected metabolite of fluoxetine.   Acetaminophen                  PRESENT      EXPECTED   Metoprolol  PRESENT      EXPECTED Drug Absent but Declared for Prescription Verification   Gabapentin                     Not Detected UNEXPECTED   Tizanidine                     Not Detected UNEXPECTED    Tizanidine, as indicated in the declared medication list, is not    always detected even when used as directed.   Methocarbamol                  Not Detected UNEXPECTED ==================================================================== Test                      Result    Flag   Units      Ref Range   Creatinine              129              mg/dL      >=20 ==================================================================== Declared Medications:  The flagging and interpretation on this report are based on the  following declared medications.  Unexpected results may arise from  inaccuracies in the declared medications.  **Note: The testing scope of this panel includes these medications:  Fluoxetine (Prozac)  Gabapentin (Neurontin)  Hydrocodone (Norco)  Methocarbamol (Robaxin)  Metoprolol (Toprol)  **Note: The testing scope of this panel does not include small to  moderate amounts of these reported medications:  Acetaminophen (Norco)  Tizanidine (Zanaflex)  **Note: The testing scope of this panel does not include following  reported medications:  Allopurinol (Zyloprim)  Amlodipine (Norvasc)  Atorvastatin (Lipitor)  Fluticasone (Flonase)  Glipizide  (Glucotrol)  Hydrochlorothiazide (Hydrodiuril)  Losartan (Cozaar)  Metformin  Nabumetone (Relafen)  Rivaroxaban (Xarelto)  Tamsulosin (Flomax) ==================================================================== For clinical consultation, please call [phone]. ====================================================================    UDS interpretation: No unexpected findings.          Medication Assessment Form: Patient introduced to form today Treatment compliance: Treatment may start today if patient agrees with proposed plan. Evaluation of compliance is not applicable at this point Risk Assessment Profile: Aberrant behavior: See initial evaluations. None observed or detected today Comorbid factors increasing risk of overdose: See initial evaluation. No additional risks detected today Medical Psychology Evaluation: Please see scanned results in medical record. Opioid Risk Tool - 05/22/17 0847      Family History of Substance Abuse   Alcohol  Negative    Illegal Drugs  Negative    Rx Drugs  Negative      Personal History of Substance Abuse   Alcohol  Negative    Illegal Drugs  Negative    Rx Drugs  Negative      Age   Age between 72-45 years   No      History of Preadolescent Sexual Abuse   History of Preadolescent Sexual Abuse  Negative or Male      Psychological Disease   Psychological Disease  Negative    Depression  Positive      Total Score   Opioid Risk Tool Scoring  1    Opioid Risk Interpretation  Low Risk      ORT Scoring interpretation table:  Score <3 = Low Risk for SUD  Score between 4-7 = Moderate Risk for SUD  Score >8 = High Risk for Opioid Abuse   Risk Mitigation Strategies:  Patient opioid safety counseling: Completed today.  Counseling provided to patient as per "Patient Counseling Document". Document signed by patient, attesting to counseling and understanding Patient-Prescriber Agreement (PPA): Obtained today.  Controlled substance  notification to other providers: Written and sent today.  Pharmacologic Plan: Today we may be taking over the patient's pharmacological regimen. See below.             Laboratory Chemistry  Inflammation Markers (CRP: Acute Phase) (ESR: Chronic Phase) Lab Results  Component Value Date   LATICACIDVEN 1.5 07/19/2016                         Rheumatology Markers Lab Results  Component Value Date   LABURIC 4.2 12/12/2016                Renal Function Markers Lab Results  Component Value Date   BUN 11 04/24/2017   CREATININE 0.95 04/24/2017   GFRAA 96 04/24/2017   GFRNONAA 83 04/24/2017                 Hepatic Function Markers Lab Results  Component Value Date   AST 16 04/24/2017   ALT 19 04/24/2017   ALBUMIN 4.4 04/24/2017   ALKPHOS 97 04/24/2017                 Electrolytes Lab Results  Component Value Date   NA 143 04/24/2017   K 3.8 04/24/2017   CL 102 04/24/2017   CALCIUM 8.8 04/24/2017   MG 1.3 (L) 07/19/2016                        Neuropathy Markers Lab Results  Component Value Date   HGBA1C 6.8 03/17/2017                 Bone Pathology Markers No results found for: VD25OH, [id number], FW2637CH8, IF0277AJ2, 25OHVITD1, 25OHVITD2, 25OHVITD3, TESTOFREE, TESTOSTERONE                       Coagulation Parameters Lab Results  Component Value Date   INR 0.97 10/18/2016   LABPROT 12.9 10/18/2016   APTT 28 10/18/2016   PLT 216 04/24/2017                 Cardiovascular Markers Lab Results  Component Value Date   BNP 65.0 04/24/2017   HGB 14.4 04/24/2017   HCT 42.7 04/24/2017                 CA Markers No results found for: CEA, CA125, LABCA2               Note: Lab results reviewed.  Recent Diagnostic Imaging Review    Lumbar DG Bending views:  Results for orders placed during the hospital encounter of 05/07/17  DG Lumbar Spine Complete W/Bend   Narrative CLINICAL DATA:  Chronic low back pain without injury.  EXAM: LUMBAR SPINE - COMPLETE  WITH BENDING VIEWS  COMPARISON:  CT of the abdomen and pelvis 07/24/2014  FINDINGS: 5 non rib-bearing lumbar type vertebral bodies are present.  Facet degenerative changes are most prominent at L4-5 bilaterally. Are mild facet degenerative changes are present at L2-3 and L3-4.  The standing neutral image demonstrates 4 mm retrolisthesis at L2-3 and 3 mm retrolisthesis at L3-4. 4 mm anterolisthesis is present at L4-5.  There is no significant change in alignment through a limited range of flexion or extension. Alignment at L5-S1 is anatomic.  No spondylolysis  is evident.  IMPRESSION: 1. Grade 1 retrolisthesis at L2-3 and L3-4 as described. 2. Grade 1 anterolisthesis at L4-5. 3. No significant change in alignment through a limited range of flexion and extension. 4. Facet degenerative changes are most significant at L4-5 bilaterally.   Electronically Signed   By: San Morelle M.D.   On: 05/07/2017 16:24     Sacroiliac Joint Imaging: Sacroiliac Joint DG:  Results for orders placed during the hospital encounter of 05/07/17  DG Si Joints   Narrative CLINICAL DATA:  Chronic low back pain without sciatica.  EXAM: BILATERAL SACROILIAC JOINTS - 3+ VIEW  COMPARISON:  CT abdomen and pelvis 07/24/2014.  FINDINGS: Mild degenerative changes are noted along the inferior SI joints bilaterally. Upper joints are within normal limits bilaterally. The pelvis is otherwise intact.  IMPRESSION: Mild degenerative changes of the SI joints bilaterally.   Electronically Signed   By: San Morelle M.D.   On: 05/07/2017 16:25     Complexity Note: Imaging results reviewed. Results shared with Mr. Tatum, using [employer].                         Meds   Current Outpatient Medications:  .  allopurinol (ZYLOPRIM) 300 MG tablet, Take 1 tablet (300 mg total) by mouth every evening., Disp: 90 tablet, Rfl: 1 .  amLODipine (NORVASC) 10 MG tablet, Take 1 tablet (10 mg  total) by mouth daily., Disp: 90 tablet, Rfl: 1 .  ASPIRIN 81 PO, Take by mouth., Disp: , Rfl:  .  atorvastatin (LIPITOR) 40 MG tablet, TAKE 1 TABLET AT BEDTIME, Disp: 90 tablet, Rfl: 0 .  FLUoxetine (PROZAC) 40 MG capsule, Take 1 capsule (40 mg total) by mouth daily., Disp: 90 capsule, Rfl: 1 .  fluticasone (FLONASE) 50 MCG/ACT nasal spray, Place 2 sprays into both nostrils daily. 2 sprays, Disp: 16 g, Rfl: 1 .  gabapentin (NEURONTIN) 300 MG capsule, Take 1 capsule (300 mg total) by mouth 3 (three) times daily., Disp: 90 capsule, Rfl: 1 .  glipiZIDE (GLUCOTROL) 5 MG tablet, Take 1 tablet (5 mg total) by mouth daily before breakfast., Disp: 90 tablet, Rfl: 0 .  glucose blood (ACCU-CHEK AVIVA PLUS) test strip, Use as instructed, Disp: 100 each, Rfl: 12 .  hydrochlorothiazide (HYDRODIURIL) 25 MG tablet, Take 1 tablet (25 mg total) by mouth daily., Disp: 90 tablet, Rfl: 1 .  HYDROcodone-acetaminophen (NORCO) 10-325 MG tablet, Take 1 tablet by mouth 2 (two) times daily as needed. For chronic pain To last for 30 days from fill date To fill on or after: 05/22/17/, 06/21/17 Ok to fill 1 day early if pharmacy closed on fill date, Disp: 60 tablet, Rfl: 0 .  losartan (COZAAR) 100 MG tablet, Take 1 tablet (100 mg total) by mouth at bedtime., Disp: 90 tablet, Rfl: 1 .  metFORMIN (GLUCOPHAGE-XR) 500 MG 24 hr tablet, Take 2 tablets (1,000 mg total) by mouth 2 (two) times daily. 1050m BID, Disp: 360 tablet, Rfl: 1 .  methocarbamol (ROBAXIN) 500 MG tablet, Take 1 tablet (500 mg total) by mouth every 6 (six) hours as needed for muscle spasms., Disp: 80 tablet, Rfl: 0 .  metoprolol succinate (TOPROL-XL) 100 MG 24 hr tablet, Take 1 tablet (100 mg total) by mouth every morning., Disp: 90 tablet, Rfl: 1 .  metoprolol succinate (TOPROL-XL) 25 MG 24 hr tablet, Take 1 tablet (25 mg total) by mouth every morning., Disp: 90 tablet, Rfl: 1 .  nabumetone (RELAFEN) 500  MG tablet, Take 1 tablet (500 mg total) by mouth 2 (two) times  daily., Disp: 180 tablet, Rfl: 0 .  tamsulosin (FLOMAX) 0.4 MG CAPS capsule, Take 1 capsule (0.4 mg total) by mouth daily after supper., Disp: 90 capsule, Rfl: 1 .  tiZANidine (ZANAFLEX) 4 MG tablet, Take 1 tablet (4 mg total) by mouth at bedtime., Disp: 90 tablet, Rfl: 0  ROS  Constitutional: Denies any fever or chills Gastrointestinal: No reported hemesis, hematochezia, vomiting, or acute GI distress Musculoskeletal: Denies any acute onset joint swelling, redness, loss of ROM, or weakness Neurological: No reported episodes of acute onset apraxia, aphasia, dysarthria, agnosia, amnesia, paralysis, loss of coordination, or loss of consciousness  Allergies  Mr. Sheller is allergic to celebrex  [celecoxib]; tetanus toxoids; and tetanus-diphtheria toxoids td.  [place]  Drug: Mr. Poland  reports that he does not use drugs. Alcohol:  reports that he does not drink alcohol. Tobacco:  reports that  has never smoked. he has never used smokeless tobacco. Medical:  has a past medical history of Anxiety, Arthritis, Cancer ([place]), Depression, Diabetes mellitus without complication ([place]), GERD (gastroesophageal reflux disease), Gout, History of kidney stones, Hyperlipidemia, Hypertension, and Pneumonia. Surgical: Mr. Bilyeu  has a past surgical history that includes Hernia repair; Arthroscopic repair ACL (Left); Mohs surgery; Cystoscopy/ureteroscopy/holmium laser/stent placement (Right, 08/17/2014); Ureteroscopy with holmium laser lithotripsy (Right, 09/07/2014); Cystoscopy w/ ureteral stent placement (Right, 09/07/2014); Vasectomy; Total knee arthroplasty (Left, 04/24/2016); and Total knee arthroplasty (Right, 10/23/2016). Family: family history includes Cancer in his mother; Depression in his sister; Hypertension in his mother; Stroke in his father.  Constitutional Exam  General appearance: Well nourished, well developed, and well hydrated. In no apparent acute distress Vitals:   05/22/17 0835  BP: (!) 166/96   Pulse: 70  Resp: 16  Temp: 98.3 F (36.8 C)  TempSrc: Oral  SpO2: 98%  Weight: 269 lb (122 kg)  Height: 6' 3" (1.905 m)   BMI Assessment: Estimated body mass index is 33.62 kg/m as calculated from the following:   Height as of this encounter: 6' 3" (1.905 m).   Weight as of this encounter: 269 lb (122 kg).  BMI interpretation table: BMI level Category Range association with higher incidence of chronic pain  <18 kg/m2 Underweight   18.5-24.9 kg/m2 Ideal body weight   25-29.9 kg/m2 Overweight Increased incidence by 20%  30-34.9 kg/m2 Obese (Class I) Increased incidence by 68%  35-39.9 kg/m2 Severe obesity (Class II) Increased incidence by 136%  >40 kg/m2 Extreme obesity (Class III) Increased incidence by 254%   BMI Readings from Last 4 Encounters:  05/22/17 33.62 kg/m  05/08/17 34.70 kg/m  05/07/17 34.37 kg/m  04/24/17 35.37 kg/m   Wt Readings from Last 4 Encounters:  05/22/17 269 lb (122 kg)  05/08/17 270 lb 4.8 oz (122.6 kg)  05/07/17 275 lb (124.7 kg)  04/24/17 275 lb 8 oz (125 kg)  Psych/Mental status: Alert, oriented x 3 (person, place, & time)       Eyes: PERLA Respiratory: No evidence of acute respiratory distress  Cervical Spine Area Exam  Skin & Axial Inspection: No masses, redness, edema, swelling, or associated skin lesions Alignment: Symmetrical Functional ROM: Unrestricted ROM      Stability: No instability detected Muscle Tone/Strength: Functionally intact. No obvious neuro-muscular anomalies detected. Sensory (Neurological): Unimpaired Palpation: No palpable anomalies              Upper Extremity (UE) Exam    Side: Right upper extremity  Side: Left upper  extremity  Skin & Extremity Inspection: Skin color, temperature, and hair growth are WNL. No peripheral edema or cyanosis. No masses, redness, swelling, asymmetry, or associated skin lesions. No contractures.  Skin & Extremity Inspection: Skin color, temperature, and hair growth are WNL. No  peripheral edema or cyanosis. No masses, redness, swelling, asymmetry, or associated skin lesions. No contractures.  Functional ROM: Unrestricted ROM          Functional ROM: Unrestricted ROM          Muscle Tone/Strength: Functionally intact. No obvious neuro-muscular anomalies detected.  Muscle Tone/Strength: Functionally intact. No obvious neuro-muscular anomalies detected.  Sensory (Neurological): Unimpaired          Sensory (Neurological): Unimpaired          Palpation: No palpable anomalies              Palpation: No palpable anomalies              Specialized Test(s): Deferred         Specialized Test(s): Deferred          Thoracic Spine Area Exam  Skin & Axial Inspection: No masses, redness, or swelling Alignment: Symmetrical Functional ROM: Unrestricted ROM Stability: No instability detected Muscle Tone/Strength: Functionally intact. No obvious neuro-muscular anomalies detected. Sensory (Neurological): Unimpaired Muscle strength & Tone: No palpable anomalies  Lumbar Spine Area Exam  Skin & Axial Inspection: No masses, redness, or swelling Alignment: Symmetrical Functional ROM: Unrestricted ROM      Stability: No instability detected Muscle Tone/Strength: Functionally intact. No obvious neuro-muscular anomalies detected. Sensory (Neurological): Unimpaired Palpation: No palpable anomalies       Provocative Tests: Lumbar Hyperextension and rotation test: Positive bilaterally for facet joint pain. Lumbar Lateral bending test: Positive due to pain. Patrick's Maneuver: Positive for bilateral S-I arthralgia              Gait & Posture Assessment  Ambulation: Unassisted Gait: Relatively normal for age and body habitus Posture: WNL   Lower Extremity Exam    Side: Right lower extremity  Side: Left lower extremity  Skin & Extremity Inspection: Skin color, temperature, and hair growth are WNL. No peripheral edema or cyanosis. No masses, redness, swelling, asymmetry, or associated  skin lesions. No contractures.  Skin & Extremity Inspection: Skin color, temperature, and hair growth are WNL. No peripheral edema or cyanosis. No masses, redness, swelling, asymmetry, or associated skin lesions. No contractures.  Functional ROM: Unrestricted ROM          Functional ROM: Unrestricted ROM          Muscle Tone/Strength: Functionally intact. No obvious neuro-muscular anomalies detected.  Muscle Tone/Strength: Functionally intact. No obvious neuro-muscular anomalies detected.  Sensory (Neurological): Unimpaired  Sensory (Neurological): Unimpaired  Palpation: No palpable anomalies  Palpation: No palpable anomalies   Assessment & Plan  Primary Diagnosis & Pertinent Problem List: The primary encounter diagnosis was Lumbar spondylosis. Diagnoses of History of bilateral knee replacement, Arthralgia of both knees, Chronic bilateral low back pain without sciatica, Chronic pain syndrome, DDD (degenerative disc disease), lumbar, and Primary osteoarthritis of both knees were also pertinent to this visit.  Visit Diagnosis: 1. Lumbar spondylosis   2. History of bilateral knee replacement   3. Arthralgia of both knees   4. Chronic bilateral low back pain without sciatica   5. Chronic pain syndrome   6. DDD (degenerative disc disease), lumbar   7. Primary osteoarthritis of both knees    General Recommendations: The pain condition that  the patient suffers from is best treated with a multidisciplinary approach that involves an increase in physical activity to prevent de-conditioning and worsening of the pain cycle, as well as psychological counseling (formal and/or informal) to address the co-morbid psychological affects of pain. Treatment will often involve judicious use of pain medications and interventional procedures to decrease the pain, allowing the patient to participate in the physical activity that will ultimately produce long-lasting pain reductions. The goal of the multidisciplinary  approach is to return the patient to a higher level of overall function and to restore their ability to perform activities of daily living.  67 year old male who presents with a chief complaint of axial low back pain, right hip pain, bilateral knee pain.  Patient's low back pain is secondary to lumbar degenerative disc disease, lumbar facet arthropathy most pronounced at L3, L4, L5 from his previous x-rays that were obtained in 2014.  On physical exam patient also has pain with axial loading and lateral rotation that is most pronounced in his lower lumbar region.  Patient is obese.  In regards to his knee pain, this is secondary to bilateral knee osteoarthritis status post bilateral knee replacement surgery.  In regards to his hip pain, he does have point tenderness over his right greater trochanteric bursa.  Patient also has positive Patrick's test bilaterally suggesting SI joint pathology.   Patient returns today for follow-up.  His UDS was appropriate.  His lumbar x-rays, SI joint x-rays were reviewed.  Lumbar x-rays show lumbar degenerative disc disease and facet arthropathy most pronounced at L4-L5 and L5-S1.  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  We discussed treatment options for this which would include lumbar medial branch nerve block at L3, L4, L5 bilaterally and/or bilateral SI joint injection.  Patient would like to hold off on these procedures for the time being.  I will have the patient sign an opioid contract today.  We will start with hydrocodone 10 mg twice daily as needed (previous dose was 10 mg 3 times daily as needed).  I will also have the patient increase his gabapentin to 300 mg twice daily for 2 weeks and then 300 mill grams 3 times daily thereafter if no side effects.  Plan: -Sign opiate agreement -Hydrocodone 10 mg twice daily as needed, quantity 86-month-Increase gabapentin to 300 mg twice daily for 2 weeks then 300 mg 3 times daily  thereafter -Continue physical therapy exercises that she learned in the past for low back muscle stretching -Follow-up in 2 months  Plan of Care  Pharmacotherapy (Medications Ordered): Meds ordered this encounter  Medications  . DISCONTD: HYDROcodone-acetaminophen (NORCO) 10-325 MG tablet    Sig: Take 1 tablet by mouth 2 (two) times daily as needed. For chronic pain To last for 30 days from fill date To fill on or after: 05/22/17/, 06/21/17 Ok to fill 1 day early if pharmacy closed on fill date    Dispense:  60 tablet    Refill:  0  . gabapentin (NEURONTIN) 300 MG capsule    Sig: Take 1 capsule (300 mg total) by mouth 3 (three) times daily.    Dispense:  90 capsule    Refill:  1  . HYDROcodone-acetaminophen (NORCO) 10-325 MG tablet    Sig: Take 1 tablet by mouth 2 (two) times daily as needed. For chronic pain To last for 30 days from fill date To fill on or after: 05/22/17/, 06/21/17 Ok to fill 1 day early if pharmacy closed  on fill date    Dispense:  60 tablet    Refill:  0   Pharmacological management options:  Opioid Analgesics: We'll take over management today. See above orders Membrane stabilizer: We have discussed the possibility of optimizing this mode of therapy, if tolerated Muscle relaxant: We have discussed the possibility of a trial NSAID: We have discussed the possibility of a trial Other analgesic(s): To be determined at a later time    Considering:   Bilateral L3, L4, L5 medial branch nerve block -Bilateral SI joint injection -Bilateral genicular nerve block.   Provider-requested follow-up: Return in about 8 weeks (around 07/17/2017) for Medication Management. Time Note: Greater than 50% of the 25 minute(s) of face-to-face time spent with Mr. Laws, was spent in counseling/coordination of care regarding: Mr. Ramires primary cause of pain, the results of his recent test(s), the treatment plan, treatment alternatives, the risks and possible complications of  proposed treatment, medication side effects, going over the informed consent, the opioid analgesic risks and possible complications, the appropriate use of his medications and the medication agreement. Future Appointments  Date Time Provider [place]  06/15/2017  8:00 AM Hubbard Hartshorn, FNP [facility]    Primary Care Physician: Hubbard Hartshorn, FNP Location: [facility] Note by: Gillis Santa, M.D Date: 05/22/2017; Time: 9:06 AM  Patient Instructions  1. Increase Gabapentin 300 mg twice daily for 2 weeks then 300 mg three times a day if no SE 2. Rx for Hydrocodone 3. Sign opioid contract 4. 2 month f./u

## 2017-06-15 ENCOUNTER — Telehealth: Payer: Self-pay

## 2017-06-15 ENCOUNTER — Ambulatory Visit: Payer: Medicare PPO | Admitting: Family Medicine

## 2017-06-15 ENCOUNTER — Encounter: Payer: Self-pay | Admitting: Family Medicine

## 2017-06-15 VITALS — BP 128/82 | HR 78 | Temp 98.4°F | Resp 18 | Ht 75.0 in | Wt 274.1 lb

## 2017-06-15 DIAGNOSIS — E6609 Other obesity due to excess calories: Secondary | ICD-10-CM | POA: Diagnosis not present

## 2017-06-15 DIAGNOSIS — M545 Low back pain: Secondary | ICD-10-CM

## 2017-06-15 DIAGNOSIS — Z6834 Body mass index (BMI) 34.0-34.9, adult: Secondary | ICD-10-CM

## 2017-06-15 DIAGNOSIS — G8929 Other chronic pain: Secondary | ICD-10-CM

## 2017-06-15 DIAGNOSIS — M2550 Pain in unspecified joint: Secondary | ICD-10-CM | POA: Diagnosis not present

## 2017-06-15 DIAGNOSIS — M25561 Pain in right knee: Secondary | ICD-10-CM | POA: Diagnosis not present

## 2017-06-15 DIAGNOSIS — M25562 Pain in left knee: Secondary | ICD-10-CM | POA: Diagnosis not present

## 2017-06-15 DIAGNOSIS — F419 Anxiety disorder, unspecified: Secondary | ICD-10-CM | POA: Diagnosis not present

## 2017-06-15 DIAGNOSIS — F1120 Opioid dependence, uncomplicated: Secondary | ICD-10-CM

## 2017-06-15 DIAGNOSIS — E7800 Pure hypercholesterolemia, unspecified: Secondary | ICD-10-CM

## 2017-06-15 DIAGNOSIS — E119 Type 2 diabetes mellitus without complications: Secondary | ICD-10-CM

## 2017-06-15 DIAGNOSIS — F331 Major depressive disorder, recurrent, moderate: Secondary | ICD-10-CM | POA: Diagnosis not present

## 2017-06-15 LAB — POCT GLYCOSYLATED HEMOGLOBIN (HGB A1C): Hemoglobin A1C: 7.4

## 2017-06-15 LAB — GLUCOSE, POCT (MANUAL RESULT ENTRY): POC GLUCOSE: 148 mg/dL — AB (ref 70–99)

## 2017-06-15 MED ORDER — GLIPIZIDE 5 MG PO TABS: 5.0000 mg | ORAL_TABLET | Freq: Two times a day (BID) | ORAL | 1 refills | Status: DC

## 2017-06-15 MED ORDER — ATORVASTATIN CALCIUM 40 MG PO TABS: 40.0000 mg | ORAL_TABLET | Freq: Every day | ORAL | 1 refills | Status: DC

## 2017-06-15 MED ORDER — HYDROXYZINE HCL 10 MG PO TABS: 10.0000 mg | ORAL_TABLET | Freq: Three times a day (TID) | ORAL | 0 refills | Status: DC | PRN

## 2017-06-15 MED ORDER — NABUMETONE 500 MG PO TABS: 500.0000 mg | ORAL_TABLET | Freq: Two times a day (BID) | ORAL | 1 refills | Status: DC

## 2017-06-15 NOTE — Telephone Encounter (Signed)
Called pt to sched AWV w/ [facility]. LVM requesting returned call.  

## 2017-06-15 NOTE — Progress Notes (Signed)
Name: Evan Rivas   MRN: [id number]    DOB: April 15, 1950   Date:06/15/2017       Progress Note  Subjective  Chief Complaint  Chief Complaint  Patient presents with  . Follow-up    3 month check  . Diabetes  . Hyperlipidemia  . Hypertension    HPI  Chronic Pain and Opioid Dependence: He is seeing Dr. [name] for management. Taking gabapentin 300mg  TID and Norco BID - will be increasing back to TID dosing after next visit; also takes Relafen daily which is prescribed by our office - needs refill today - most recent kidney function is reviewed and is WNL.. Pain is mostly in low back and bilateral knees (is s/p bilateral TKR) - states pain has been more and more controlled with new medications.   BLE Edema: He notes swelling for several months now - we added HCTZ 25mg , also added compression socks at last visit along with elevating feet at night - these interventions have been working well and he reports only mild edema at the end of some days.  Denies chest pain or shortness of breath, no palpitations.  We discussed removing amlodipine from his daily HTN regimen, however he has been doing well with current interventions, so we will keep current regimen for the time being and monitor.  HTN: BP had increased while weaning himself off of his Clonazepam and Norco, and he is now doing quite well - BP today is 128/82, home BP's have also been controlled.He is taking losartan 100mg , amlodipine 10mg , metoprolol 125mg ; we added HCTZ 25mg  (for BLE edema) and he is tolerating well.  Has been very compliant with his medications. He used to take lisinopril, but developed chronic cough - cough stopped after he stopped taking the medication. He has never been diagnosed with HF; he denies shortness of breath or chest pain, no palpitations, no headaches, lightheadedness, dizziness.  Diabetes:  In the last week Fasting BG's have been running 150's - highest 209, lowest 133. A1C today is 7.4% up from 6.8%  at last visit.  Currently taking Metformin 1000mg  BID and 5mg  Glipizide QAM. Jane Canary, but it was too expensive, he has done very well on glipizide and restarted this at 5mg  once daily - he is not interested in changing medications, but is willing to increase dosing of current meds if possible. No changes in diet - does not follow a diabetic diet typically, but tries not to over-indulge; has gone to diabetic classes in the past.  He denies hypoglycemic episodes.  Denies polyphagia, polyuria, or polydipsia.  Has plans to go for exam - will fax results to our clinic.  Needs to sschedule eye exam - pt is reminded again. Fasting 148 today.  Depression and Anxiety: He stopped taking his clonazepam and prozac on 03/10/2017 because his PCP is leaving and he did not want to be referred to psychiatry.   He is back on prozac 40mg  and doing well, says his anxiety is almost back to normal, but he still has an increase in anxiety when he is in a store or around a lot of people and at night.  He is interested in having hydroxyzine on hand as PRN medication for panic attacks and insomnia. We discussed possible sedative effects, and he verbalizes understanding.  Hyperlipidemia: Last LDL was 81 on 12/12/16. Denies myalgias, chest pain, shortness of breath, or palpitations. Exercises regularly, works hard on healthy diet as well.  Obesity: Exercising 4-6 times a week, tries to  eat a lower carbohydrate diet.  He has gained 5lbs since last visit. Has had bilateral knee replacements and it is hard for him to add cardio aside from recumbent bike.  Patient Active Problem List   Diagnosis Date Noted  . Pneumonia 07/19/2016  . BPH without urinary obstruction 06/03/2015  . Depression 03/05/2015  . Hyperlipidemia 09/03/2014  . Major depressive disorder, recurrent episode, moderate ([place]) 09/03/2014  . Chronic low back pain 09/03/2014  . Arthralgia of both knees 09/03/2014  . Type 2 diabetes mellitus without complication  ([place]) 27/09/2374  . Allergic rhinitis 07/04/2014  . Anxiety 07/04/2014  . Continuous opioid dependence ([place]) 07/04/2014  . DDD (degenerative disc disease), lumbar 07/04/2014  . Gout of foot 07/04/2014  . Essential hypertension 07/04/2014  . Flu vaccine need 07/04/2014  . Adiposity 07/04/2014  . OA (osteoarthritis) of knee 07/04/2014  . Neuralgia neuritis, sciatic nerve 07/04/2014  . Arthralgia of multiple joints 07/04/2014  . H/O malignant neoplasm of skin 12/24/2013    Past Surgical History:  Procedure Laterality Date  . ARTHROSCOPIC REPAIR ACL Left   . CYSTOSCOPY W/ URETERAL STENT PLACEMENT Right 09/07/2014   Procedure: CYSTOSCOPY WITH STENT REPLACEMENT;  Surgeon: Hollice Espy, MD;  Location: [facility];  Service: Urology;  Laterality: Right;  . CYSTOSCOPY/URETEROSCOPY/HOLMIUM LASER/STENT PLACEMENT Right 08/17/2014   Procedure: CYSTOSCOPY/URETEROSCOPY//STENT PLACEMENT/ attempt of lithotripsy;  Surgeon: Hollice Espy, MD;  Location: [facility];  Service: Urology;  Laterality: Right;  . HERNIA REPAIR    . MOHS SURGERY     lip  . TOTAL KNEE ARTHROPLASTY Left 04/24/2016   Procedure: LEFT TOTAL KNEE ARTHROPLASTY;  Surgeon: Gaynelle Arabian, MD;  Location: [facility];  Service: Orthopedics;  Laterality: Left;  Adductor Block  . TOTAL KNEE ARTHROPLASTY Right 10/23/2016   Procedure: RIGHT TOTAL KNEE ARTHROPLASTY;  Surgeon: Gaynelle Arabian, MD;  Location: [facility];  Service: Orthopedics;  Laterality: Right;  . URETEROSCOPY WITH HOLMIUM LASER LITHOTRIPSY Right 09/07/2014   Procedure: URETEROSCOPY WITH HOLMIUM LASER LITHOTRIPSY;  Surgeon: Hollice Espy, MD;  Location: [facility];  Service: Urology;  Laterality: Right;  [name] VASECTOMY      Family History  Problem Relation Age of Onset  . Hypertension Mother   . Cancer Mother   . Stroke Father   . Depression Sister     Social History   Socioeconomic History  . Marital status: Married    Spouse name: Not on file  . Number of children: Not on file  .  Years of education: Not on file  . Highest education level: Not on file  Social Needs  . Financial resource strain: Not on file  . Food insecurity - worry: Not on file  . Food insecurity - inability: Not on file  . Transportation needs - medical: Not on file  . Transportation needs - non-medical: Not on file  Occupational History  . Not on file  Tobacco Use  . Smoking status: Never Smoker  . Smokeless tobacco: Never Used  Substance and Sexual Activity  . Alcohol use: No  . Drug use: No  . Sexual activity: Yes  Other Topics Concern  . Not on file  Social History Narrative  . Not on file     Current Outpatient Medications:  .  allopurinol (ZYLOPRIM) 300 MG tablet, Take 1 tablet (300 mg total) by mouth every evening., Disp: 90 tablet, Rfl: 1 .  amLODipine (NORVASC) 10 MG tablet, Take 1 tablet (10 mg total) by mouth daily., Disp: 90 tablet, Rfl: 1 .  ASPIRIN  81 PO, Take by mouth., Disp: , Rfl:  .  atorvastatin (LIPITOR) 40 MG tablet, Take 1 tablet (40 mg total) by mouth at bedtime., Disp: 90 tablet, Rfl: 1 .  FLUoxetine (PROZAC) 40 MG capsule, Take 1 capsule (40 mg total) by mouth daily., Disp: 90 capsule, Rfl: 1 .  fluticasone (FLONASE) 50 MCG/ACT nasal spray, Place 2 sprays into both nostrils daily. 2 sprays, Disp: 16 g, Rfl: 1 .  gabapentin (NEURONTIN) 300 MG capsule, Take 1 capsule (300 mg total) by mouth 3 (three) times daily., Disp: 90 capsule, Rfl: 1 .  glipiZIDE (GLUCOTROL) 5 MG tablet, Take 1 tablet (5 mg total) by mouth 2 (two) times daily before a meal., Disp: 180 tablet, Rfl: 1 .  glucose blood (ACCU-CHEK AVIVA PLUS) test strip, Use as instructed, Disp: 100 each, Rfl: 12 .  hydrochlorothiazide (HYDRODIURIL) 25 MG tablet, Take 1 tablet (25 mg total) by mouth daily., Disp: 90 tablet, Rfl: 1 .  HYDROcodone-acetaminophen (NORCO) 10-325 MG tablet, Take 1 tablet by mouth 2 (two) times daily as needed. For chronic pain To last for 30 days from fill date To fill on or after:  05/22/17/, 06/21/17 Ok to fill 1 day early if pharmacy closed on fill date, Disp: 60 tablet, Rfl: 0 .  losartan (COZAAR) 100 MG tablet, Take 1 tablet (100 mg total) by mouth at bedtime., Disp: 90 tablet, Rfl: 1 .  metFORMIN (GLUCOPHAGE-XR) 500 MG 24 hr tablet, Take 2 tablets (1,000 mg total) by mouth 2 (two) times daily. 1000mg  BID, Disp: 360 tablet, Rfl: 1 .  methocarbamol (ROBAXIN) 500 MG tablet, Take 1 tablet (500 mg total) by mouth every 6 (six) hours as needed for muscle spasms., Disp: 80 tablet, Rfl: 0 .  metoprolol succinate (TOPROL-XL) 100 MG 24 hr tablet, Take 1 tablet (100 mg total) by mouth every morning., Disp: 90 tablet, Rfl: 1 .  metoprolol succinate (TOPROL-XL) 25 MG 24 hr tablet, Take 1 tablet (25 mg total) by mouth every morning., Disp: 90 tablet, Rfl: 1 .  nabumetone (RELAFEN) 500 MG tablet, Take 1 tablet (500 mg total) by mouth 2 (two) times daily., Disp: 180 tablet, Rfl: 1 .  tamsulosin (FLOMAX) 0.4 MG CAPS capsule, Take 1 capsule (0.4 mg total) by mouth daily after supper., Disp: 90 capsule, Rfl: 1 .  tiZANidine (ZANAFLEX) 4 MG tablet, Take 1 tablet (4 mg total) by mouth at bedtime., Disp: 90 tablet, Rfl: 0 .  hydrOXYzine (ATARAX/VISTARIL) 10 MG tablet, Take 1 tablet (10 mg total) by mouth every 8 (eight) hours as needed for anxiety., Disp: 20 tablet, Rfl: 0  Allergies  Allergen Reactions  . Celebrex  [Celecoxib]     GI  . Tetanus Toxoids Other (See Comments)    unkown  . Tetanus-Diphtheria Toxoids Td     Other reaction(s): Unknown    ROS Constitutional: Negative for fever or weight change.  Respiratory: Negative for cough and shortness of breath.   Cardiovascular: Negative for chest pain or palpitations.  Gastrointestinal: Negative for abdominal pain, no bowel changes.  Musculoskeletal: Negative for gait problem or joint swelling.  Skin: Negative for rash.  Neurological: Negative for dizziness or headache.  No other specific complaints in a complete review of systems  (except as listed in HPI above).  Objective  Vitals:   06/15/17 0801  BP: 128/82  Pulse: 78  Resp: 18  Temp: 98.4 F (36.9 C)  TempSrc: Oral  SpO2: 93%  Weight: 274 lb 1.6 oz (124.3 kg)  Height: 6\' 3"  (  1.905 m)   Body mass index is 34.26 kg/m.  Physical Exam Constitutional: Patient appears well-developed and well-nourished. No distress.  HENT: Head: Normocephalic and atraumatic. Ears: B TMs ok, no erythema or effusion; Nose: Nose normal. Mouth/Throat: Oropharynx is clear and moist. No oropharyngeal exudate.  Eyes: Conjunctivae and EOM are normal. Pupils are equal, round, and reactive to light. No scleral icterus.  Neck: Normal range of motion. Neck supple. No JVD present. Cardiovascular: Normal rate, regular rhythm and normal heart sounds.  No murmur heard. No BLE edema. Pulmonary/Chest: Effort normal and breath sounds normal. No respiratory distress. Musculoskeletal: Normal range of motion, no joint effusions. No gross deformities Neurological: he is alert and oriented to person, place, and time. No cranial nerve deficit. Coordination, balance, strength, speech and gait are normal.  Skin: Skin is warm and dry. No rash noted. No erythema.  Psychiatric: Patient has a normal mood and affect. behavior is normal. Judgment and thought content normal.  Results for orders placed or performed in visit on 06/15/17 (from the past 72 hour(s))  POCT Glucose (CBG)     Status: Abnormal   Collection Time: 06/15/17  8:09 AM  Result Value Ref Range   POC Glucose 148 (A) 70 - 99 mg/dl  POCT HgB A1C     Status: None   Collection Time: 06/15/17  8:42 AM  Result Value Ref Range   Hemoglobin A1C 7.4      PHQ2/9: Depression screen [facility] 2/9 05/22/2017 05/07/2017 03/17/2017 01/19/2017 12/12/2016  Decreased Interest 0 0 0 0 0  Down, Depressed, Hopeless 0 0 0 0 0  PHQ - 2 Score 0 0 0 0 0    Fall Risk: Fall Risk  05/22/2017 05/07/2017 03/17/2017 01/19/2017 12/12/2016  Falls in the past year? No Yes No  No No  Number falls in past yr: - 1 - - -    Assessment & Plan  1. Type 2 diabetes mellitus without complication, without long-term current use of insulin (HCC) - Lengthy discussion regarding medications was had during our visit, he has run into cost issues with several medications in the past, and prefers to increase dosing of glipizide instead of changing his regimen.  Discussed increased hypoglycemia risk with glipizide, but as pt has tolerated well for quite some time, we will increase glipizide to BID dosing. - POCT Glucose (CBG) - POCT HgB A1C - glipiZIDE (GLUCOTROL) 5 MG tablet; Take 1 tablet (5 mg total) by mouth 2 (two) times daily before a meal.  Dispense: 180 tablet; Refill: 1  2. Arthralgia of both knees - nabumetone (RELAFEN) 500 MG tablet; Take 1 tablet (500 mg total) by mouth 2 (two) times daily.  Dispense: 180 tablet; Refill: 1  3. Arthralgia of multiple joints - nabumetone (RELAFEN) 500 MG tablet; Take 1 tablet (500 mg total) by mouth 2 (two) times daily.  Dispense: 180 tablet; Refill: 1  4. Continuous opioid dependence ([place]) Keep follow up with Dr. [name]  5. Chronic bilateral low back pain without sciatica - nabumetone (RELAFEN) 500 MG tablet; Take 1 tablet (500 mg total) by mouth 2 (two) times daily.  Dispense: 180 tablet; Refill: 1  6. Pure hypercholesterolemia - atorvastatin (LIPITOR) 40 MG tablet; Take 1 tablet (40 mg total) by mouth at bedtime.  Dispense: 90 tablet; Refill: 1  7. Major depressive disorder, recurrent episode, moderate (HCC) - Doing well on prozac, stable  8. Anxiety - hydrOXYzine (ATARAX/VISTARIL) 10 MG tablet; Take 1 tablet (10 mg total) by mouth every 8 (eight)  hours as needed for anxiety.  Dispense: 20 tablet; Refill: 0 - Doing well on prozac at this time. - Advised of potential sedative risk of hydroxyzine, pt verbalizes understanding. He will use hydroxyzine on PRN basis only.  9. Class 1 obesity due to excess calories with serious  comorbidity and body mass index (BMI) of 34.0 to 34.9 in adult - Discussed importance of 150 minutes of physical activity weekly, eat two servings of fish weekly, eat one serving of tree nuts ( cashews, pistachios, pecans, almonds.[name]) every other day, eat 6 servings of fruit/vegetables daily and drink plenty of water and avoid sweet beverages.

## 2017-07-11 ENCOUNTER — Encounter: Payer: Self-pay | Admitting: Student in an Organized Health Care Education/Training Program

## 2017-07-11 ENCOUNTER — Ambulatory Visit
Payer: Medicare PPO | Attending: Student in an Organized Health Care Education/Training Program | Admitting: Student in an Organized Health Care Education/Training Program

## 2017-07-11 ENCOUNTER — Other Ambulatory Visit: Payer: Self-pay

## 2017-07-11 VITALS — BP 176/84 | HR 70 | Temp 98.6°F | Resp 18 | Ht 75.0 in | Wt 274.0 lb

## 2017-07-11 DIAGNOSIS — Z96653 Presence of artificial knee joint, bilateral: Secondary | ICD-10-CM | POA: Insufficient documentation

## 2017-07-11 DIAGNOSIS — M549 Dorsalgia, unspecified: Secondary | ICD-10-CM | POA: Insufficient documentation

## 2017-07-11 DIAGNOSIS — Z823 Family history of stroke: Secondary | ICD-10-CM | POA: Diagnosis not present

## 2017-07-11 DIAGNOSIS — E669 Obesity, unspecified: Secondary | ICD-10-CM | POA: Insufficient documentation

## 2017-07-11 DIAGNOSIS — F331 Major depressive disorder, recurrent, moderate: Secondary | ICD-10-CM | POA: Insufficient documentation

## 2017-07-11 DIAGNOSIS — Z79899 Other long term (current) drug therapy: Secondary | ICD-10-CM | POA: Insufficient documentation

## 2017-07-11 DIAGNOSIS — G8929 Other chronic pain: Secondary | ICD-10-CM | POA: Diagnosis not present

## 2017-07-11 DIAGNOSIS — J309 Allergic rhinitis, unspecified: Secondary | ICD-10-CM | POA: Diagnosis not present

## 2017-07-11 DIAGNOSIS — N400 Enlarged prostate without lower urinary tract symptoms: Secondary | ICD-10-CM | POA: Diagnosis not present

## 2017-07-11 DIAGNOSIS — M545 Low back pain: Secondary | ICD-10-CM

## 2017-07-11 DIAGNOSIS — Z87442 Personal history of urinary calculi: Secondary | ICD-10-CM | POA: Diagnosis not present

## 2017-07-11 DIAGNOSIS — M170 Bilateral primary osteoarthritis of knee: Secondary | ICD-10-CM | POA: Diagnosis not present

## 2017-07-11 DIAGNOSIS — E119 Type 2 diabetes mellitus without complications: Secondary | ICD-10-CM | POA: Diagnosis not present

## 2017-07-11 DIAGNOSIS — Z9889 Other specified postprocedural states: Secondary | ICD-10-CM | POA: Insufficient documentation

## 2017-07-11 DIAGNOSIS — Z79891 Long term (current) use of opiate analgesic: Secondary | ICD-10-CM | POA: Diagnosis not present

## 2017-07-11 DIAGNOSIS — Z7982 Long term (current) use of aspirin: Secondary | ICD-10-CM | POA: Insufficient documentation

## 2017-07-11 DIAGNOSIS — Z7984 Long term (current) use of oral hypoglycemic drugs: Secondary | ICD-10-CM | POA: Diagnosis not present

## 2017-07-11 DIAGNOSIS — Z760 Encounter for issue of repeat prescription: Secondary | ICD-10-CM | POA: Insufficient documentation

## 2017-07-11 DIAGNOSIS — E785 Hyperlipidemia, unspecified: Secondary | ICD-10-CM | POA: Insufficient documentation

## 2017-07-11 DIAGNOSIS — M109 Gout, unspecified: Secondary | ICD-10-CM | POA: Diagnosis not present

## 2017-07-11 DIAGNOSIS — Z818 Family history of other mental and behavioral disorders: Secondary | ICD-10-CM | POA: Insufficient documentation

## 2017-07-11 DIAGNOSIS — K219 Gastro-esophageal reflux disease without esophagitis: Secondary | ICD-10-CM | POA: Diagnosis not present

## 2017-07-11 DIAGNOSIS — Z886 Allergy status to analgesic agent status: Secondary | ICD-10-CM | POA: Diagnosis not present

## 2017-07-11 DIAGNOSIS — M47816 Spondylosis without myelopathy or radiculopathy, lumbar region: Secondary | ICD-10-CM | POA: Diagnosis not present

## 2017-07-11 DIAGNOSIS — G894 Chronic pain syndrome: Secondary | ICD-10-CM | POA: Diagnosis not present

## 2017-07-11 DIAGNOSIS — Z8249 Family history of ischemic heart disease and other diseases of the circulatory system: Secondary | ICD-10-CM | POA: Insufficient documentation

## 2017-07-11 DIAGNOSIS — M25551 Pain in right hip: Secondary | ICD-10-CM | POA: Diagnosis not present

## 2017-07-11 DIAGNOSIS — I10 Essential (primary) hypertension: Secondary | ICD-10-CM | POA: Insufficient documentation

## 2017-07-11 DIAGNOSIS — M5136 Other intervertebral disc degeneration, lumbar region: Secondary | ICD-10-CM

## 2017-07-11 DIAGNOSIS — F419 Anxiety disorder, unspecified: Secondary | ICD-10-CM | POA: Diagnosis not present

## 2017-07-11 MED ORDER — HYDROCODONE-ACETAMINOPHEN 10-325 MG PO TABS: 1.0000 | ORAL_TABLET | Freq: Three times a day (TID) | ORAL | 0 refills | Status: DC | PRN

## 2017-07-11 MED ORDER — HYDROCODONE-ACETAMINOPHEN 10-325 MG PO TABS
1.0000 | ORAL_TABLET | Freq: Three times a day (TID) | ORAL | 0 refills | Status: DC | PRN
Start: 2017-07-11 — End: 2017-08-30

## 2017-07-11 NOTE — Progress Notes (Signed)
Patient's Name: Evan Rivas  MRN: [id number]  Referring Provider: Hubbard Hartshorn, FNP  DOB: 09/27/1950  PCP: Hubbard Hartshorn, FNP  DOS: 07/11/2017  Note by: Gillis Santa, MD  Service setting: Ambulatory outpatient  Specialty: Interventional Pain Management  Location: [facility] (AMB) Pain Management Facility    Patient type: Established   Primary Reason(s) for Visit: Encounter for prescription drug management. (Level of risk: moderate)  CC: Knee Pain (bilateral); Back Pain (low); and Hip Pain (right)  HPI  Evan Rivas is a 67 y.o. year old, male patient, who comes today for a medication management evaluation. He has Allergic rhinitis; Anxiety; Continuous opioid dependence ([place]); DDD (degenerative disc disease), lumbar; Gout of foot; Essential hypertension; Flu vaccine need; Adiposity; OA (osteoarthritis) of knee; Neuralgia neuritis, sciatic nerve; Arthralgia of multiple joints; H/O malignant neoplasm of skin; Hyperlipidemia; Major depressive disorder, recurrent episode, moderate ([place]); Chronic low back pain; Arthralgia of both knees; Type 2 diabetes mellitus without complication ([name]); Depression; BPH without urinary obstruction; and Pneumonia on their problem list. His primarily concern today is the Knee Pain (bilateral); Back Pain (low); and Hip Pain (right)  Pain Assessment: Location: Lower Back Radiating: right hip and both knees Onset: More than a month ago Duration: Chronic pain Quality: Constant, Aching Severity: 3 /10 (self-reported pain score)  Note: Reported level is compatible with observation.                         When using our objective Pain Scale, levels between 6 and 10/10 are said to belong in an emergency room, as it progressively worsens from a 6/10, described as severely limiting, requiring emergency care not usually available at an outpatient pain management facility. At a 6/10 level, communication becomes difficult and requires great effort. Assistance to reach the  emergency department may be required. Facial flushing and profuse sweating along with potentially dangerous increases in heart rate and blood pressure will be evident. Effect on ADL:   Timing: Constant Modifying factors: medictions   Evan Rivas was last scheduled for an appointment on 05/22/2017 for medication management. During today's appointment we reviewed Evan Rivas's chronic pain status, as well as his outpatient medication regimen.  Patient doing well overall. Is traveling with his son to [place] for a trip.  He will be driving down there which is a 10 hour drive  and states that he will be exerting himself while he is down there.  He is requesting a temporary increase in his hydrocodone 3 times a day (this was his previous dose).  The patient  reports that he does not use drugs. His body mass index is 34.25 kg/m.  Further details on both, my assessment(s), as well as the proposed treatment plan, please see below.  Controlled Substance Pharmacotherapy Assessment REMS (Risk Evaluation and Mitigation Strategy)  Analgesic: Hydrocodone 10 mg twice daily as needed, quantity 60 a month MME/day: 20 mg/day.  Evan Rochester, RN  07/11/2017  9:06 AM  Sign at close encounter Nursing Pain Medication Assessment:  Safety precautions to be maintained throughout the outpatient stay will include: orient to surroundings, keep bed in low position, maintain call bell within reach at all times, provide assistance with transfer out of bed and ambulation.  Medication Inspection Compliance: Pill count conducted under aseptic conditions, in front of the patient. Neither the pills nor the bottle was removed from the patient's sight at any time. Once count was completed pills were immediately returned to the patient  in their original bottle.  Medication: Hydrocodone/APAP Pill/Patch Count: 18 of 60 pills remain Pill/Patch Appearance: Markings consistent with prescribed medication Bottle Appearance:  Standard pharmacy container. Clearly labeled. Filled Date: 03 / 20 / 2019 Last Medication intake:  Today   Pharmacokinetics: Liberation and absorption (onset of action): WNL Distribution (time to peak effect): WNL Metabolism and excretion (duration of action): WNL         Pharmacodynamics: Desired effects: Analgesia: [name] reports >50% benefit. Functional ability: Patient reports that medication allows him to accomplish basic ADLs Clinically meaningful improvement in function (CMIF): Sustained CMIF goals met Perceived effectiveness: Described as relatively effective, allowing for increase in activities of daily living (ADL) Undesirable effects: Side-effects or Adverse reactions: None reported Monitoring: [place] PMP: Online review of the past 39-monthperiod conducted. Compliant with practice rules and regulations Last UDS on record: Summary  Date Value Ref Range Status  05/07/2017 FINAL  Final    Comment:    ==================================================================== TOXASSURE COMP DRUG ANALYSIS,UR ==================================================================== Test                             Result       Flag       Units Drug Present and Declared for Prescription Verification   Hydrocodone                    396          EXPECTED   ng/mg creat   Hydromorphone                  78           EXPECTED   ng/mg creat   Dihydrocodeine                 213          EXPECTED   ng/mg creat   Norhydrocodone                 814          EXPECTED   ng/mg creat    Sources of hydrocodone include scheduled prescription    medications. Hydromorphone, dihydrocodeine and norhydrocodone are    expected metabolites of hydrocodone. Hydromorphone and    dihydrocodeine are also available as scheduled prescription    medications.   Fluoxetine                     PRESENT      EXPECTED   Norfluoxetine                  PRESENT      EXPECTED    Norfluoxetine is an expected metabolite of  fluoxetine.   Acetaminophen                  PRESENT      EXPECTED   Metoprolol                     PRESENT      EXPECTED Drug Absent but Declared for Prescription Verification   Gabapentin                     Not Detected UNEXPECTED   Tizanidine                     Not Detected UNEXPECTED    Tizanidine, as indicated in the declared medication list,  is not    always detected even when used as directed.   Methocarbamol                  Not Detected UNEXPECTED ==================================================================== Test                      Result    Flag   Units      Ref Range   Creatinine              129              mg/dL      >=20 ==================================================================== Declared Medications:  The flagging and interpretation on this report are based on the  following declared medications.  Unexpected results may arise from  inaccuracies in the declared medications.  **Note: The testing scope of this panel includes these medications:  Fluoxetine (Prozac)  Gabapentin (Neurontin)  Hydrocodone (Norco)  Methocarbamol (Robaxin)  Metoprolol (Toprol)  **Note: The testing scope of this panel does not include small to  moderate amounts of these reported medications:  Acetaminophen (Norco)  Tizanidine (Zanaflex)  **Note: The testing scope of this panel does not include following  reported medications:  Allopurinol (Zyloprim)  Amlodipine (Norvasc)  Atorvastatin (Lipitor)  Fluticasone (Flonase)  Glipizide (Glucotrol)  Hydrochlorothiazide (Hydrodiuril)  Losartan (Cozaar)  Metformin  Nabumetone (Relafen)  Rivaroxaban (Xarelto)  Tamsulosin (Flomax) ==================================================================== For clinical consultation, please call [phone]. ====================================================================    UDS interpretation: Compliant          Medication Assessment Form: Reviewed. Patient indicates being  compliant with therapy Treatment compliance: Compliant Risk Assessment Profile: Aberrant behavior: See prior evaluations. None observed or detected today Comorbid factors increasing risk of overdose: See prior notes. No additional risks detected today Risk of substance use disorder (SUD): Low Opioid Risk Tool - 05/22/17 0847      Family History of Substance Abuse   Alcohol  Negative    Illegal Drugs  Negative    Rx Drugs  Negative      Personal History of Substance Abuse   Alcohol  Negative    Illegal Drugs  Negative    Rx Drugs  Negative      Age   Age between 68-45 years   No      History of Preadolescent Sexual Abuse   History of Preadolescent Sexual Abuse  Negative or Male      Psychological Disease   Psychological Disease  Negative    Depression  Positive      Total Score   Opioid Risk Tool Scoring  1    Opioid Risk Interpretation  Low Risk      ORT Scoring interpretation table:  Score <3 = Low Risk for SUD  Score between 4-7 = Moderate Risk for SUD  Score >8 = High Risk for Opioid Abuse   Risk Mitigation Strategies:  Patient Counseling: Covered Patient-Prescriber Agreement (PPA): Present and active  Notification to other healthcare providers: Done  Pharmacologic Plan: Temporary increase of hydrocodone from 10 mg twice daily as needed to 10 mg 3 times daily as needed, quantity 64-month  This is because the patient will be traveling and will be engaging in increased physical activity.             Laboratory Chemistry  Inflammation Markers (CRP: Acute Phase) (ESR: Chronic Phase) Lab Results  Component Value Date   LATICACIDVEN 1.5 07/19/2016  Rheumatology Markers Lab Results  Component Value Date   LABURIC 4.2 12/12/2016                        Renal Function Markers Lab Results  Component Value Date   BUN 11 04/24/2017   CREATININE 0.95 04/24/2017   GFRAA 96 04/24/2017   GFRNONAA 83 04/24/2017                               Hepatic Function Markers Lab Results  Component Value Date   AST 16 04/24/2017   ALT 19 04/24/2017   ALBUMIN 4.4 04/24/2017   ALKPHOS 97 04/24/2017                        Electrolytes Lab Results  Component Value Date   NA 143 04/24/2017   K 3.8 04/24/2017   CL 102 04/24/2017   CALCIUM 8.8 04/24/2017   MG 1.3 (L) 07/19/2016                        Neuropathy Markers Lab Results  Component Value Date   HGBA1C 7.4 06/15/2017                        Bone Pathology Markers No results found for: [place], [id number], [id number], [id number], 25OHVITD1, 25OHVITD2, 25OHVITD3, TESTOFREE, TESTOSTERONE                       Coagulation Parameters Lab Results  Component Value Date   INR 0.97 10/18/2016   LABPROT 12.9 10/18/2016   APTT 28 10/18/2016   PLT 216 04/24/2017                        Cardiovascular Markers Lab Results  Component Value Date   BNP 65.0 04/24/2017   HGB 14.4 04/24/2017   HCT 42.7 04/24/2017                         CA Markers No results found for: CEA, CA125, LABCA2                      Note: Lab results reviewed.  Recent Diagnostic Imaging Results  DG Si Joints CLINICAL DATA:  Chronic low back pain without sciatica.  EXAM: BILATERAL SACROILIAC JOINTS - 3+ VIEW  COMPARISON:  CT abdomen and pelvis 07/24/2014.  FINDINGS: Mild degenerative changes are noted along the inferior SI joints bilaterally. Upper joints are within normal limits bilaterally. The pelvis is otherwise intact.  IMPRESSION: Mild degenerative changes of the SI joints bilaterally.  Electronically Signed   By: San Morelle M.D.   On: 05/07/2017 16:25 DG Lumbar Spine Complete W/Bend CLINICAL DATA:  Chronic low back pain without injury.  EXAM: LUMBAR SPINE - COMPLETE WITH BENDING VIEWS  COMPARISON:  CT of the abdomen and pelvis 07/24/2014  FINDINGS: 5 non rib-bearing lumbar type vertebral bodies are present.  Facet degenerative changes are most prominent at  L4-5 bilaterally. Are mild facet degenerative changes are present at L2-3 and L3-4.  The standing neutral image demonstrates 4 mm retrolisthesis at L2-3 and 3 mm retrolisthesis at L3-4. 4 mm anterolisthesis is present at L4-5.  There is no significant change in alignment through a limited range of flexion or extension. Alignment  at L5-S1 is anatomic.  No spondylolysis is evident.  IMPRESSION: 1. Grade 1 retrolisthesis at L2-3 and L3-4 as described. 2. Grade 1 anterolisthesis at L4-5. 3. No significant change in alignment through a limited range of flexion and extension. 4. Facet degenerative changes are most significant at L4-5 bilaterally.  Electronically Signed   By: San Morelle M.D.   On: 05/07/2017 16:24  Complexity Note: Imaging results reviewed. Results shared with Evan Rivas, using [employer].                         Meds   Current Outpatient Medications:  .  allopurinol (ZYLOPRIM) 300 MG tablet, Take 1 tablet (300 mg total) by mouth every evening., Disp: 90 tablet, Rfl: 1 .  amLODipine (NORVASC) 10 MG tablet, Take 1 tablet (10 mg total) by mouth daily., Disp: 90 tablet, Rfl: 1 .  ASPIRIN 81 PO, Take by mouth., Disp: , Rfl:  .  atorvastatin (LIPITOR) 40 MG tablet, Take 1 tablet (40 mg total) by mouth at bedtime., Disp: 90 tablet, Rfl: 1 .  FLUoxetine (PROZAC) 40 MG capsule, Take 1 capsule (40 mg total) by mouth daily., Disp: 90 capsule, Rfl: 1 .  fluticasone (FLONASE) 50 MCG/ACT nasal spray, Place 2 sprays into both nostrils daily. 2 sprays, Disp: 16 g, Rfl: 1 .  gabapentin (NEURONTIN) 300 MG capsule, Take 1 capsule (300 mg total) by mouth 3 (three) times daily., Disp: 90 capsule, Rfl: 1 .  glipiZIDE (GLUCOTROL) 5 MG tablet, Take 1 tablet (5 mg total) by mouth 2 (two) times daily before a meal., Disp: 180 tablet, Rfl: 1 .  glucose blood (ACCU-CHEK AVIVA PLUS) test strip, Use as instructed, Disp: 100 each, Rfl: 12 .  hydrochlorothiazide (HYDRODIURIL) 25 MG  tablet, Take 1 tablet (25 mg total) by mouth daily., Disp: 90 tablet, Rfl: 1 .  HYDROcodone-acetaminophen (NORCO) 10-325 MG tablet, Take 1 tablet by mouth 3 (three) times daily as needed. For chronic pain To last for 30 days from fill date To fill on or after: 07/19/17, 08/17/17 Ok to fill 1 day early if pharmacy closed on fill date, Disp: 90 tablet, Rfl: 0 .  hydrOXYzine (ATARAX/VISTARIL) 10 MG tablet, Take 1 tablet (10 mg total) by mouth every 8 (eight) hours as needed for anxiety., Disp: 20 tablet, Rfl: 0 .  losartan (COZAAR) 100 MG tablet, Take 1 tablet (100 mg total) by mouth at bedtime., Disp: 90 tablet, Rfl: 1 .  metFORMIN (GLUCOPHAGE-XR) 500 MG 24 hr tablet, Take 2 tablets (1,000 mg total) by mouth 2 (two) times daily. 1079m BID, Disp: 360 tablet, Rfl: 1 .  methocarbamol (ROBAXIN) 500 MG tablet, Take 1 tablet (500 mg total) by mouth every 6 (six) hours as needed for muscle spasms., Disp: 80 tablet, Rfl: 0 .  metoprolol succinate (TOPROL-XL) 100 MG 24 hr tablet, Take 1 tablet (100 mg total) by mouth every morning., Disp: 90 tablet, Rfl: 1 .  metoprolol succinate (TOPROL-XL) 25 MG 24 hr tablet, Take 1 tablet (25 mg total) by mouth every morning., Disp: 90 tablet, Rfl: 1 .  nabumetone (RELAFEN) 500 MG tablet, Take 1 tablet (500 mg total) by mouth 2 (two) times daily., Disp: 180 tablet, Rfl: 1 .  tamsulosin (FLOMAX) 0.4 MG CAPS capsule, Take 1 capsule (0.4 mg total) by mouth daily after supper., Disp: 90 capsule, Rfl: 1 .  tiZANidine (ZANAFLEX) 4 MG tablet, Take 1 tablet (4 mg total) by mouth at bedtime., Disp: 90 tablet, Rfl:  0  ROS  Constitutional: Denies any fever or chills Gastrointestinal: No reported hemesis, hematochezia, vomiting, or acute GI distress Musculoskeletal: Denies any acute onset joint swelling, redness, loss of ROM, or weakness Neurological: No reported episodes of acute onset apraxia, aphasia, dysarthria, agnosia, amnesia, paralysis, loss of coordination, or loss of  consciousness  Allergies  Evan Rivas is allergic to celebrex  [celecoxib]; tetanus toxoids; and tetanus-diphtheria toxoids td.  [place]  Drug: Evan Rivas  reports that he does not use drugs. Alcohol:  reports that he does not drink alcohol. Tobacco:  reports that he has never smoked. He has never used smokeless tobacco. Medical:  has a past medical history of Anxiety, Arthritis, Cancer ([place]), Depression, Diabetes mellitus without complication ([place]), GERD (gastroesophageal reflux disease), Gout, History of kidney stones, Hyperlipidemia, Hypertension, and Pneumonia. Surgical: Evan Rivas  has a past surgical history that includes Hernia repair; Arthroscopic repair ACL (Left); Mohs surgery; Cystoscopy/ureteroscopy/holmium laser/stent placement (Right, 08/17/2014); Ureteroscopy with holmium laser lithotripsy (Right, 09/07/2014); Cystoscopy w/ ureteral stent placement (Right, 09/07/2014); Vasectomy; Total knee arthroplasty (Left, 04/24/2016); and Total knee arthroplasty (Right, 10/23/2016). Family: family history includes Cancer in his mother; Depression in his sister; Hypertension in his mother; Stroke in his father.  Constitutional Exam  General appearance: Well nourished, well developed, and well hydrated. In no apparent acute distress Vitals:   07/11/17 0859  BP: (!) 176/84  Pulse: 70  Resp: 18  Temp: 98.6 F (37 C)  SpO2: 99%  Weight: 274 lb (124.3 kg)  Height: '6\' 3"'  (1.905 m)   BMI Assessment: Estimated body mass index is 34.25 kg/m as calculated from the following:   Height as of this encounter: '6\' 3"'  (1.905 m).   Weight as of this encounter: 274 lb (124.3 kg).  BMI interpretation table: BMI level Category Range association with higher incidence of chronic pain  <18 kg/m2 Underweight   18.5-24.9 kg/m2 Ideal body weight   25-29.9 kg/m2 Overweight Increased incidence by 20%  30-34.9 kg/m2 Obese (Class I) Increased incidence by 68%  35-39.9 kg/m2 Severe obesity (Class II) Increased  incidence by 136%  >40 kg/m2 Extreme obesity (Class III) Increased incidence by 254%   BMI Readings from Last 4 Encounters:  07/11/17 34.25 kg/m  06/15/17 34.26 kg/m  05/22/17 33.62 kg/m  05/08/17 34.70 kg/m   Wt Readings from Last 4 Encounters:  07/11/17 274 lb (124.3 kg)  06/15/17 274 lb 1.6 oz (124.3 kg)  05/22/17 269 lb (122 kg)  05/08/17 270 lb 4.8 oz (122.6 kg)  Psych/Mental status: Alert, oriented x 3 (person, place, & time)       Eyes: PERLA Respiratory: No evidence of acute respiratory distress  Cervical Spine Area Exam  Skin & Axial Inspection: No masses, redness, edema, swelling, or associated skin lesions Alignment: Symmetrical Functional ROM: Unrestricted ROM      Stability: No instability detected Muscle Tone/Strength: Functionally intact. No obvious neuro-muscular anomalies detected. Sensory (Neurological): Unimpaired Palpation: No palpable anomalies              Upper Extremity (UE) Exam    Side: Right upper extremity  Side: Left upper extremity  Skin & Extremity Inspection: Skin color, temperature, and hair growth are WNL. No peripheral edema or cyanosis. No masses, redness, swelling, asymmetry, or associated skin lesions. No contractures.  Skin & Extremity Inspection: Skin color, temperature, and hair growth are WNL. No peripheral edema or cyanosis. No masses, redness, swelling, asymmetry, or associated skin lesions. No contractures.  Functional ROM: Unrestricted ROM  Functional ROM: Unrestricted ROM          Muscle Tone/Strength: Functionally intact. No obvious neuro-muscular anomalies detected.  Muscle Tone/Strength: Functionally intact. No obvious neuro-muscular anomalies detected.  Sensory (Neurological): Unimpaired          Sensory (Neurological): Unimpaired          Palpation: No palpable anomalies              Palpation: No palpable anomalies              Specialized Test(s): Deferred         Specialized Test(s): Deferred          Thoracic  Spine Area Exam  Skin & Axial Inspection: No masses, redness, or swelling Alignment: Symmetrical Functional ROM: Unrestricted ROM Stability: No instability detected Muscle Tone/Strength: Functionally intact. No obvious neuro-muscular anomalies detected. Sensory (Neurological): Unimpaired Muscle strength & Tone: No palpable anomalies  Lumbar Spine Area Exam  Skin & Axial Inspection: No masses, redness, or swelling Alignment: Symmetrical Functional ROM: Unrestricted ROM      Stability: No instability detected Muscle Tone/Strength: Functionally intact. No obvious neuro-muscular anomalies detected. Sensory (Neurological): Unimpaired Palpation: No palpable anomalies       Provocative Tests: Lumbar Hyperextension and rotation test: Positive bilaterally for facet joint pain. Lumbar Lateral bending test: Positive due to pain. Patrick's Maneuver: Positive for bilateral S-I arthralgia              Gait & Posture Assessment  Ambulation: Unassisted Gait: Relatively normal for age and body habitus Posture: WNL   Lower Extremity Exam    Side: Right lower extremity  Side: Left lower extremity  Skin & Extremity Inspection: Skin color, temperature, and hair growth are WNL. No peripheral edema or cyanosis. No masses, redness, swelling, asymmetry, or associated skin lesions. No contractures.  Skin & Extremity Inspection: Skin color, temperature, and hair growth are WNL. No peripheral edema or cyanosis. No masses, redness, swelling, asymmetry, or associated skin lesions. No contractures.  Functional ROM: Unrestricted ROM          Functional ROM: Unrestricted ROM          Muscle Tone/Strength: Functionally intact. No obvious neuro-muscular anomalies detected.  Muscle Tone/Strength: Functionally intact. No obvious neuro-muscular anomalies detected.  Sensory (Neurological): Unimpaired  Sensory (Neurological): Unimpaired  Palpation: No palpable anomalies  Palpation: No palpable anomalies   Assessment   Primary Diagnosis & Pertinent Problem List: The primary encounter diagnosis was Lumbar spondylosis. Diagnoses of DDD (degenerative disc disease), lumbar, Primary osteoarthritis of both knees, History of bilateral knee replacement, Chronic bilateral low back pain without sciatica, and Chronic pain syndrome were also pertinent to this visit.  Status Diagnosis  Persistent Persistent Persistent 1. Lumbar spondylosis   2. DDD (degenerative disc disease), lumbar   3. Primary osteoarthritis of both knees   4. History of bilateral knee replacement   5. Chronic bilateral low back pain without sciatica   6. Chronic pain syndrome      General Recommendations: The pain condition that the patient suffers from is best treated with a multidisciplinary approach that involves an increase in physical activity to prevent de-conditioning and worsening of the pain cycle, as well as psychological counseling (formal and/or informal) to address the co-morbid psychological affects of pain. Treatment will often involve judicious use of pain medications and interventional procedures to decrease the pain, allowing the patient to participate in the physical activity that will ultimately produce long-lasting pain reductions. The goal of the multidisciplinary  approach is to return the patient to a higher level of overall function and to restore their ability to perform activities of daily living.  67 year old male who presents with a chief complaint of axial low back pain, right hip pain, bilateral knee pain. Patient's low back pain is secondary to lumbar degenerative disc disease, lumbar facet arthropathy most pronounced at L3, L4, L5. 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  We discussed treatment options for this which would include lumbar medial branch nerve block at L3, L4, L5 bilaterally and/or bilateral SI joint injection.  Patient would like to hold off on these procedures for the time  being.  Patient returns today for medication refill.  He is requesting a temporary increase in his hydrocodone from 10 mg twice daily to 3 times daily as needed given that the patient will be traveling with his son to [place] which will be a 10 hour car drive. and will be physically exerting himself more while he is down there.  Patient was previously on hydrocodone 10 mg 3 times daily as needed.  It is reasonable to increase his hydrocodone temporarily but I told the patient that we would likely reduce back to his dose of quantity 60 after his trip to [place].  I encouraged the patient to consider lumbar facet blocks that we have discussed in the past to help with his axial low back pain when he returns.  Patient continues gabapentin 300 mg 3 times daily without any side effects.  Plan: -Prescription for hydrocodone at an increased dose temporarily, 10 mg 3 times daily as needed, quantity 90/month.  Will reduce dose back to twice daily as needed when patient returns from [place] and consider lumbar facet injections to help assist with his axial low back pain -Continue gabapentin 300 mg 3 times daily. -Continue physical therapy exercises that patient has learned in the past for low back muscle strengthening and range of motion  Plan of Care  Pharmacotherapy (Medications Ordered): Meds ordered this encounter  Medications  . DISCONTD: HYDROcodone-acetaminophen (NORCO) 10-325 MG tablet    Sig: Take 1 tablet by mouth 3 (three) times daily as needed. For chronic pain To last for 30 days from fill date To fill on or after: 07/19/17, 08/17/17 Ok to fill 1 day early if pharmacy closed on fill date    Dispense:  90 tablet    Refill:  0  . HYDROcodone-acetaminophen (NORCO) 10-325 MG tablet    Sig: Take 1 tablet by mouth 3 (three) times daily as needed. For chronic pain To last for 30 days from fill date To fill on or after: 07/19/17, 08/17/17 Ok to fill 1 day early if pharmacy closed on fill date     Dispense:  90 tablet    Refill:  0    Provider-requested follow-up: Return in about 8 weeks (around 09/05/2017) for Medication Management. Time Note: Greater than 50% of the 25 minute(s) of face-to-face time spent with Mr. Lanum, was spent in counseling/coordination of care regarding: Mr. Hiltz primary cause of pain, the treatment plan, medication side effects, the opioid analgesic risks and possible complications, realistic expectations, the goals of pain management (increased in functionality) and the patient's responsibilities when it comes to controlled substances. Future Appointments  Date Time Provider [place]  08/30/2017  8:45 AM Gillis Santa, MD [facility] None  09/17/2017  8:20 AM Poulose, Bethel Born, NP [facility]    Primary Care Physician: Hubbard Hartshorn, FNP Location: [facility] Note by: Gillis Santa, M.D Date: 07/11/2017; Time: 10:44 AM  There are no Patient Instructions on file for this visit.

## 2017-07-11 NOTE — Progress Notes (Signed)
Nursing Pain Medication Assessment:  Safety precautions to be maintained throughout the outpatient stay will include: orient to surroundings, keep bed in low position, maintain call bell within reach at all times, provide assistance with transfer out of bed and ambulation.  Medication Inspection Compliance: Pill count conducted under aseptic conditions, in front of the patient. Neither the pills nor the bottle was removed from the patient's sight at any time. Once count was completed pills were immediately returned to the patient in their original bottle.  Medication: Hydrocodone/APAP Pill/Patch Count: 18 of 60 pills remain Pill/Patch Appearance: Markings consistent with prescribed medication Bottle Appearance: Standard pharmacy container. Clearly labeled. Filled Date: 03 / 20 / 2019 Last Medication intake:  Today

## 2017-07-15 ENCOUNTER — Other Ambulatory Visit: Payer: Self-pay | Admitting: Student in an Organized Health Care Education/Training Program

## 2017-07-16 ENCOUNTER — Telehealth: Payer: Self-pay | Admitting: *Deleted

## 2017-07-17 ENCOUNTER — Telehealth: Payer: Self-pay | Admitting: *Deleted

## 2017-07-17 ENCOUNTER — Other Ambulatory Visit: Payer: Self-pay | Admitting: Nurse Practitioner

## 2017-07-17 MED ORDER — GABAPENTIN 300 MG PO CAPS: 300.0000 mg | ORAL_CAPSULE | Freq: Three times a day (TID) | ORAL | 1 refills | Status: DC

## 2017-07-17 NOTE — Telephone Encounter (Signed)
Crystal,            Is it ok to call in 1 refill of this patient's Neurontin on Dr. Elwyn Lade behalf?

## 2017-08-30 ENCOUNTER — Encounter: Payer: Self-pay | Admitting: Student in an Organized Health Care Education/Training Program

## 2017-08-30 ENCOUNTER — Other Ambulatory Visit: Payer: Self-pay

## 2017-08-30 ENCOUNTER — Ambulatory Visit
Payer: Medicare PPO | Attending: Student in an Organized Health Care Education/Training Program | Admitting: Student in an Organized Health Care Education/Training Program

## 2017-08-30 VITALS — BP 164/84 | HR 70 | Temp 98.3°F | Resp 18 | Ht 74.0 in | Wt 280.0 lb

## 2017-08-30 DIAGNOSIS — Z760 Encounter for issue of repeat prescription: Secondary | ICD-10-CM | POA: Diagnosis not present

## 2017-08-30 DIAGNOSIS — Z7984 Long term (current) use of oral hypoglycemic drugs: Secondary | ICD-10-CM | POA: Diagnosis not present

## 2017-08-30 DIAGNOSIS — E119 Type 2 diabetes mellitus without complications: Secondary | ICD-10-CM | POA: Diagnosis not present

## 2017-08-30 DIAGNOSIS — Z7982 Long term (current) use of aspirin: Secondary | ICD-10-CM | POA: Insufficient documentation

## 2017-08-30 DIAGNOSIS — Z886 Allergy status to analgesic agent status: Secondary | ICD-10-CM | POA: Insufficient documentation

## 2017-08-30 DIAGNOSIS — G8929 Other chronic pain: Secondary | ICD-10-CM

## 2017-08-30 DIAGNOSIS — J309 Allergic rhinitis, unspecified: Secondary | ICD-10-CM | POA: Insufficient documentation

## 2017-08-30 DIAGNOSIS — M545 Low back pain, unspecified: Secondary | ICD-10-CM

## 2017-08-30 DIAGNOSIS — M170 Bilateral primary osteoarthritis of knee: Secondary | ICD-10-CM | POA: Diagnosis not present

## 2017-08-30 DIAGNOSIS — Z823 Family history of stroke: Secondary | ICD-10-CM | POA: Insufficient documentation

## 2017-08-30 DIAGNOSIS — M25551 Pain in right hip: Secondary | ICD-10-CM | POA: Diagnosis not present

## 2017-08-30 DIAGNOSIS — M47816 Spondylosis without myelopathy or radiculopathy, lumbar region: Secondary | ICD-10-CM

## 2017-08-30 DIAGNOSIS — Z888 Allergy status to other drugs, medicaments and biological substances status: Secondary | ICD-10-CM | POA: Insufficient documentation

## 2017-08-30 DIAGNOSIS — G894 Chronic pain syndrome: Secondary | ICD-10-CM | POA: Diagnosis not present

## 2017-08-30 DIAGNOSIS — E785 Hyperlipidemia, unspecified: Secondary | ICD-10-CM | POA: Insufficient documentation

## 2017-08-30 DIAGNOSIS — F331 Major depressive disorder, recurrent, moderate: Secondary | ICD-10-CM | POA: Diagnosis not present

## 2017-08-30 DIAGNOSIS — Z79891 Long term (current) use of opiate analgesic: Secondary | ICD-10-CM | POA: Diagnosis not present

## 2017-08-30 DIAGNOSIS — I10 Essential (primary) hypertension: Secondary | ICD-10-CM | POA: Insufficient documentation

## 2017-08-30 DIAGNOSIS — M109 Gout, unspecified: Secondary | ICD-10-CM | POA: Insufficient documentation

## 2017-08-30 DIAGNOSIS — N400 Enlarged prostate without lower urinary tract symptoms: Secondary | ICD-10-CM | POA: Insufficient documentation

## 2017-08-30 DIAGNOSIS — F419 Anxiety disorder, unspecified: Secondary | ICD-10-CM | POA: Insufficient documentation

## 2017-08-30 DIAGNOSIS — M25561 Pain in right knee: Secondary | ICD-10-CM | POA: Insufficient documentation

## 2017-08-30 DIAGNOSIS — Z79899 Other long term (current) drug therapy: Secondary | ICD-10-CM | POA: Insufficient documentation

## 2017-08-30 DIAGNOSIS — M5136 Other intervertebral disc degeneration, lumbar region: Secondary | ICD-10-CM | POA: Diagnosis not present

## 2017-08-30 DIAGNOSIS — E669 Obesity, unspecified: Secondary | ICD-10-CM | POA: Diagnosis not present

## 2017-08-30 DIAGNOSIS — Z87442 Personal history of urinary calculi: Secondary | ICD-10-CM | POA: Insufficient documentation

## 2017-08-30 DIAGNOSIS — Z818 Family history of other mental and behavioral disorders: Secondary | ICD-10-CM | POA: Insufficient documentation

## 2017-08-30 DIAGNOSIS — K219 Gastro-esophageal reflux disease without esophagitis: Secondary | ICD-10-CM | POA: Insufficient documentation

## 2017-08-30 DIAGNOSIS — M25552 Pain in left hip: Secondary | ICD-10-CM | POA: Diagnosis not present

## 2017-08-30 DIAGNOSIS — Z96653 Presence of artificial knee joint, bilateral: Secondary | ICD-10-CM | POA: Diagnosis not present

## 2017-08-30 DIAGNOSIS — Z9889 Other specified postprocedural states: Secondary | ICD-10-CM | POA: Insufficient documentation

## 2017-08-30 DIAGNOSIS — M25562 Pain in left knee: Secondary | ICD-10-CM | POA: Diagnosis not present

## 2017-08-30 DIAGNOSIS — Z8249 Family history of ischemic heart disease and other diseases of the circulatory system: Secondary | ICD-10-CM | POA: Insufficient documentation

## 2017-08-30 MED ORDER — GABAPENTIN 300 MG PO CAPS: 300.0000 mg | ORAL_CAPSULE | Freq: Three times a day (TID) | ORAL | 4 refills | Status: DC

## 2017-08-30 MED ORDER — HYDROCODONE-ACETAMINOPHEN 10-325 MG PO TABS: 1.0000 | ORAL_TABLET | Freq: Three times a day (TID) | ORAL | 0 refills | Status: DC | PRN

## 2017-08-30 NOTE — Patient Instructions (Signed)
You have been given 3 scripts for Hydrocodone today. 

## 2017-08-30 NOTE — Progress Notes (Signed)
Nursing Pain Medication Assessment:  Safety precautions to be maintained throughout the outpatient stay will include: orient to surroundings, keep bed in low position, maintain call bell within reach at all times, provide assistance with transfer out of bed and ambulation.  Medication Inspection Compliance: Pill count conducted under aseptic conditions, in front of the patient. Neither the pills nor the bottle was removed from the patient's sight at any time. Once count was completed pills were immediately returned to the patient in their original bottle.  Medication: Hydrocodone/APAP Pill/Patch Count: 57 of 90 pills remain Pill/Patch Appearance: Markings consistent with prescribed medication Bottle Appearance: Standard pharmacy container. Clearly labeled. Filled Date: 05/ 17/ 2019 Last Medication intake:  Evan Rivas

## 2017-08-30 NOTE — Progress Notes (Signed)
Patient's Name: Evan Rivas  MRN: [id number]  Referring Provider: Hubbard Hartshorn, FNP  DOB: July 10, 1950  PCP: Hubbard Hartshorn, FNP  DOS: 08/30/2017  Note by: Gillis Santa, MD  Service setting: Ambulatory outpatient  Specialty: Interventional Pain Management  Location: [facility] (AMB) Pain Management Facility    Patient type: Established   Primary Reason(s) for Visit: Encounter for prescription drug management. (Level of risk: moderate)  CC: Back Pain (low); Knee Pain (bilateral); and Hip Pain (right)  HPI  Evan Rivas is a 67 y.o. year old, male patient, who comes today for a medication management evaluation. He has Allergic rhinitis; Anxiety; Continuous opioid dependence ([place]); DDD (degenerative disc disease), lumbar; Gout of foot; Essential hypertension; Flu vaccine need; Adiposity; OA (osteoarthritis) of knee; Neuralgia neuritis, sciatic nerve; Arthralgia of multiple joints; H/O malignant neoplasm of skin; Hyperlipidemia; Major depressive disorder, recurrent episode, moderate ([place]); Chronic low back pain; Arthralgia of both knees; Type 2 diabetes mellitus without complication ([place]); Depression; BPH without urinary obstruction; and Pneumonia on their problem list. His primarily concern today is the Back Pain (low); Knee Pain (bilateral); and Hip Pain (right)  Pain Assessment: Location: Lower Back Radiating: denies Onset: More than a month ago Duration: Chronic pain Quality: Aching Severity: 3 /10 (subjective, self-reported pain score)  Note: Reported level is compatible with observation.                         When using our objective Pain Scale, levels between 6 and 10/10 are said to belong in an emergency room, as it progressively worsens from a 6/10, described as severely limiting, requiring emergency care not usually available at an outpatient pain management facility. At a 6/10 level, communication becomes difficult and requires great effort. Assistance to reach the emergency department may  be required. Facial flushing and profuse sweating along with potentially dangerous increases in heart rate and blood pressure will be evident. Effect on ADL:   Timing: Constant Modifying factors: denies BP: (!) 164/84  HR: 70  Evan Rivas was last scheduled for an appointment on 07/15/2017 for medication management. During today's appointment we reviewed Evan Rivas's chronic pain status, as well as his outpatient medication regimen.  Overall patient is doing well.  He returned from his trip to [place].  He states that his knees and his low back are at baseline.  Stable on his current medication regimen.  The patient  reports that he does not use drugs. His body mass index is 35.95 kg/m.  Further details on both, my assessment(s), as well as the proposed treatment plan, please see below.  Controlled Substance Pharmacotherapy Assessment REMS (Risk Evaluation and Mitigation Strategy)  Analgesic: Hydrocodone 10 mg 3 times daily as needed, quantity 90 MME/day: 30 mg/day.  [name], RN  08/30/2017  8:48 AM  Signed Nursing Pain Medication Assessment:  Safety precautions to be maintained throughout the outpatient stay will include: orient to surroundings, keep bed in low position, maintain call bell within reach at all times, provide assistance with transfer out of bed and ambulation.  Medication Inspection Compliance: Pill count conducted under aseptic conditions, in front of the patient. Neither the pills nor the bottle was removed from the patient's sight at any time. Once count was completed pills were immediately returned to the patient in their original bottle.  Medication: Hydrocodone/APAP Pill/Patch Count: 57 of 90 pills remain Pill/Patch Appearance: Markings consistent with prescribed medication Bottle Appearance: Standard pharmacy container. Clearly labeled. Filled Date:  05/ 17/ 2019 Last Medication intake:  Yesterday   Pharmacokinetics: Liberation and absorption (onset of  action): WNL Distribution (time to peak effect): WNL Metabolism and excretion (duration of action): WNL         Pharmacodynamics: Desired effects: Analgesia: Evan Rivas reports >50% benefit. Functional ability: Patient reports that medication allows him to accomplish basic ADLs Clinically meaningful improvement in function (CMIF): Sustained CMIF goals met Perceived effectiveness: Described as relatively effective, allowing for increase in activities of daily living (ADL) Undesirable effects: Side-effects or Adverse reactions: None reported Monitoring: [place] PMP: Online review of the past 81-monthperiod conducted. Compliant with practice rules and regulations Last UDS on record: Summary  Date Value Ref Range Status  05/07/2017 FINAL  Final    Comment:    ==================================================================== TOXASSURE COMP DRUG ANALYSIS,UR ==================================================================== Test                             Result       Flag       Units Drug Present and Declared for Prescription Verification   Hydrocodone                    396          EXPECTED   ng/mg creat   Hydromorphone                  78           EXPECTED   ng/mg creat   Dihydrocodeine                 213          EXPECTED   ng/mg creat   Norhydrocodone                 814          EXPECTED   ng/mg creat    Sources of hydrocodone include scheduled prescription    medications. Hydromorphone, dihydrocodeine and norhydrocodone are    expected metabolites of hydrocodone. Hydromorphone and    dihydrocodeine are also available as scheduled prescription    medications.   Fluoxetine                     PRESENT      EXPECTED   Norfluoxetine                  PRESENT      EXPECTED    Norfluoxetine is an expected metabolite of fluoxetine.   Acetaminophen                  PRESENT      EXPECTED   Metoprolol                     PRESENT      EXPECTED Drug Absent but Declared for Prescription  Verification   Gabapentin                     Not Detected UNEXPECTED   Tizanidine                     Not Detected UNEXPECTED    Tizanidine, as indicated in the declared medication list, is not    always detected even when used as directed.   Methocarbamol  Not Detected UNEXPECTED ==================================================================== Test                      Result    Flag   Units      Ref Range   Creatinine              129              mg/dL      >=20 ==================================================================== Declared Medications:  The flagging and interpretation on this report are based on the  following declared medications.  Unexpected results may arise from  inaccuracies in the declared medications.  **Note: The testing scope of this panel includes these medications:  Fluoxetine (Prozac)  Gabapentin (Neurontin)  Hydrocodone (Norco)  Methocarbamol (Robaxin)  Metoprolol (Toprol)  **Note: The testing scope of this panel does not include small to  moderate amounts of these reported medications:  Acetaminophen (Norco)  Tizanidine (Zanaflex)  **Note: The testing scope of this panel does not include following  reported medications:  Allopurinol (Zyloprim)  Amlodipine (Norvasc)  Atorvastatin (Lipitor)  Fluticasone (Flonase)  Glipizide (Glucotrol)  Hydrochlorothiazide (Hydrodiuril)  Losartan (Cozaar)  Metformin  Nabumetone (Relafen)  Rivaroxaban (Xarelto)  Tamsulosin (Flomax) ==================================================================== For clinical consultation, please call [phone]. ====================================================================    UDS interpretation: Compliant          Medication Assessment Form: Reviewed. Patient indicates being compliant with therapy Treatment compliance: Compliant Risk Assessment Profile: Aberrant behavior: See prior evaluations. None observed or detected today Comorbid  factors increasing risk of overdose: See prior notes. No additional risks detected today Risk of substance use disorder (SUD): Low Opioid Risk Tool - 08/30/17 0845      Family History of Substance Abuse   Alcohol  Negative    Illegal Drugs  Negative    Rx Drugs  Negative      Personal History of Substance Abuse   Alcohol  Negative    Illegal Drugs  Negative    Rx Drugs  Negative      Age   Age between 27-45 years   No      History of Preadolescent Sexual Abuse   History of Preadolescent Sexual Abuse  Negative or Male      Psychological Disease   Psychological Disease  Negative    Depression  Positive      Total Score   Opioid Risk Tool Scoring  1    Opioid Risk Interpretation  Low Risk      ORT Scoring interpretation table:  Score <3 = Low Risk for SUD  Score between 4-7 = Moderate Risk for SUD  Score >8 = High Risk for Opioid Abuse   Risk Mitigation Strategies:  Patient Counseling: Covered Patient-Prescriber Agreement (PPA): Present and active  Notification to other healthcare providers: Done  Pharmacologic Plan: No change in therapy, at this time.             Laboratory Chemistry  Inflammation Markers (CRP: Acute Phase) (ESR: Chronic Phase) Lab Results  Component Value Date   LATICACIDVEN 1.5 07/19/2016                         Rheumatology Markers Lab Results  Component Value Date   LABURIC 4.2 12/12/2016                        Renal Function Markers Lab Results  Component Value Date   BUN 11 04/24/2017  CREATININE 0.95 04/24/2017   BCR 12 04/24/2017   GFRAA 96 04/24/2017   GFRNONAA 83 04/24/2017                              Hepatic Function Markers Lab Results  Component Value Date   AST 16 04/24/2017   ALT 19 04/24/2017   ALBUMIN 4.4 04/24/2017   ALKPHOS 97 04/24/2017                        Electrolytes Lab Results  Component Value Date   NA 143 04/24/2017   K 3.8 04/24/2017   CL 102 04/24/2017   CALCIUM 8.8 04/24/2017   MG 1.3 (L)  07/19/2016                        Neuropathy Markers Lab Results  Component Value Date   HGBA1C 7.4 06/15/2017                        Bone Pathology Markers No results found for: [place], [id number], [id number], [id number], 25OHVITD1, 25OHVITD2, 25OHVITD3, TESTOFREE, TESTOSTERONE                       Coagulation Parameters Lab Results  Component Value Date   INR 0.97 10/18/2016   LABPROT 12.9 10/18/2016   APTT 28 10/18/2016   PLT 216 04/24/2017                        Cardiovascular Markers Lab Results  Component Value Date   BNP 65.0 04/24/2017   HGB 14.4 04/24/2017   HCT 42.7 04/24/2017                         CA Markers No results found for: CEA, CA125, LABCA2                      Note: Lab results reviewed.  Recent Diagnostic Imaging Results  DG Si Joints CLINICAL DATA:  Chronic low back pain without sciatica.  EXAM: BILATERAL SACROILIAC JOINTS - 3+ VIEW  COMPARISON:  CT abdomen and pelvis 07/24/2014.  FINDINGS: Mild degenerative changes are noted along the inferior SI joints bilaterally. Upper joints are within normal limits bilaterally. The pelvis is otherwise intact.  IMPRESSION: Mild degenerative changes of the SI joints bilaterally.  Electronically Signed   By: San Morelle M.D.   On: 05/07/2017 16:25 DG Lumbar Spine Complete W/Bend CLINICAL DATA:  Chronic low back pain without injury.  EXAM: LUMBAR SPINE - COMPLETE WITH BENDING VIEWS  COMPARISON:  CT of the abdomen and pelvis 07/24/2014  FINDINGS: 5 non rib-bearing lumbar type vertebral bodies are present.  Facet degenerative changes are most prominent at L4-5 bilaterally. Are mild facet degenerative changes are present at L2-3 and L3-4.  The standing neutral image demonstrates 4 mm retrolisthesis at L2-3 and 3 mm retrolisthesis at L3-4. 4 mm anterolisthesis is present at L4-5.  There is no significant change in alignment through a limited range of flexion or extension.  Alignment at L5-S1 is anatomic.  No spondylolysis is evident.  IMPRESSION: 1. Grade 1 retrolisthesis at L2-3 and L3-4 as described. 2. Grade 1 anterolisthesis at L4-5. 3. No significant change in alignment through a limited range of flexion and extension. 4. Facet degenerative changes are most  significant at L4-5 bilaterally.  Electronically Signed   By: San Morelle M.D.   On: 05/07/2017 16:24  Complexity Note: Imaging results reviewed. Results shared with Mr. Malan, using [employer].                         Meds   Current Outpatient Medications:  .  allopurinol (ZYLOPRIM) 300 MG tablet, Take 1 tablet (300 mg total) by mouth every evening., Disp: 90 tablet, Rfl: 1 .  amLODipine (NORVASC) 10 MG tablet, Take 1 tablet (10 mg total) by mouth daily., Disp: 90 tablet, Rfl: 1 .  ASPIRIN 81 PO, Take by mouth., Disp: , Rfl:  .  atorvastatin (LIPITOR) 40 MG tablet, Take 1 tablet (40 mg total) by mouth at bedtime., Disp: 90 tablet, Rfl: 1 .  FLUoxetine (PROZAC) 40 MG capsule, Take 1 capsule (40 mg total) by mouth daily., Disp: 90 capsule, Rfl: 1 .  fluticasone (FLONASE) 50 MCG/ACT nasal spray, Place 2 sprays into both nostrils daily. 2 sprays, Disp: 16 g, Rfl: 1 .  gabapentin (NEURONTIN) 300 MG capsule, Take 1 capsule (300 mg total) by mouth 3 (three) times daily., Disp: 90 capsule, Rfl: 4 .  glipiZIDE (GLUCOTROL) 5 MG tablet, Take 1 tablet (5 mg total) by mouth 2 (two) times daily before a meal., Disp: 180 tablet, Rfl: 1 .  glucose blood (ACCU-CHEK AVIVA PLUS) test strip, Use as instructed, Disp: 100 each, Rfl: 12 .  hydrochlorothiazide (HYDRODIURIL) 25 MG tablet, Take 1 tablet (25 mg total) by mouth daily., Disp: 90 tablet, Rfl: 1 .  HYDROcodone-acetaminophen (NORCO) 10-325 MG tablet, Take 1 tablet by mouth 3 (three) times daily as needed. For chronic pain To last for 30 days from fill date To fill on or after: 09/16/17, 10/15/17, 11/14/17 Ok to fill 1 day early if pharmacy closed  on fill date, Disp: 90 tablet, Rfl: 0 .  hydrOXYzine (ATARAX/VISTARIL) 10 MG tablet, Take 1 tablet (10 mg total) by mouth every 8 (eight) hours as needed for anxiety., Disp: 20 tablet, Rfl: 0 .  losartan (COZAAR) 100 MG tablet, Take 1 tablet (100 mg total) by mouth at bedtime., Disp: 90 tablet, Rfl: 1 .  metFORMIN (GLUCOPHAGE-XR) 500 MG 24 hr tablet, Take 2 tablets (1,000 mg total) by mouth 2 (two) times daily. 1075m BID, Disp: 360 tablet, Rfl: 1 .  methocarbamol (ROBAXIN) 500 MG tablet, Take 1 tablet (500 mg total) by mouth every 6 (six) hours as needed for muscle spasms., Disp: 80 tablet, Rfl: 0 .  metoprolol succinate (TOPROL-XL) 100 MG 24 hr tablet, Take 1 tablet (100 mg total) by mouth every morning., Disp: 90 tablet, Rfl: 1 .  metoprolol succinate (TOPROL-XL) 25 MG 24 hr tablet, Take 1 tablet (25 mg total) by mouth every morning., Disp: 90 tablet, Rfl: 1 .  nabumetone (RELAFEN) 500 MG tablet, Take 1 tablet (500 mg total) by mouth 2 (two) times daily., Disp: 180 tablet, Rfl: 1 .  tamsulosin (FLOMAX) 0.4 MG CAPS capsule, Take 1 capsule (0.4 mg total) by mouth daily after supper., Disp: 90 capsule, Rfl: 1 .  tiZANidine (ZANAFLEX) 4 MG tablet, Take 1 tablet (4 mg total) by mouth at bedtime., Disp: 90 tablet, Rfl: 0  ROS  Constitutional: Denies any fever or chills Gastrointestinal: No reported hemesis, hematochezia, vomiting, or acute GI distress Musculoskeletal: Denies any acute onset joint swelling, redness, loss of ROM, or weakness Neurological: No reported episodes of acute onset apraxia, aphasia, dysarthria, agnosia, amnesia, paralysis,  loss of coordination, or loss of consciousness  Allergies  Mr. Weatherspoon is allergic to celebrex  [celecoxib]; tetanus toxoids; and tetanus-diphtheria toxoids td.  [place]  Drug: Mr. Blancett  reports that he does not use drugs. Alcohol:  reports that he does not drink alcohol. Tobacco:  reports that he has never smoked. He has never used smokeless  tobacco. Medical:  has a past medical history of Anxiety, Arthritis, Cancer ([place]), Depression, Diabetes mellitus without complication ([place]), GERD (gastroesophageal reflux disease), Gout, History of kidney stones, Hyperlipidemia, Hypertension, and Pneumonia. Surgical: Mr. Yurkovich  has a past surgical history that includes Hernia repair; Arthroscopic repair ACL (Left); Mohs surgery; Cystoscopy/ureteroscopy/holmium laser/stent placement (Right, 08/17/2014); Ureteroscopy with holmium laser lithotripsy (Right, 09/07/2014); Cystoscopy w/ ureteral stent placement (Right, 09/07/2014); Vasectomy; Total knee arthroplasty (Left, 04/24/2016); and Total knee arthroplasty (Right, 10/23/2016). Family: family history includes Cancer in his mother; Depression in his sister; Hypertension in his mother; Stroke in his father.  Constitutional Exam  General appearance: Well nourished, well developed, and well hydrated. In no apparent acute distress Vitals:   08/30/17 0839 08/30/17 0841  BP:  (!) 164/84  Pulse: 70   Resp: 18   Temp: 98.3 F (36.8 C)   SpO2: 98%   Weight: 280 lb (127 kg)   Height: '6\' 2"'  (1.88 m)    BMI Assessment: Estimated body mass index is 35.95 kg/m as calculated from the following:   Height as of this encounter: '6\' 2"'  (1.88 m).   Weight as of this encounter: 280 lb (127 kg).  BMI interpretation table: BMI level Category Range association with higher incidence of chronic pain  <18 kg/m2 Underweight   18.5-24.9 kg/m2 Ideal body weight   25-29.9 kg/m2 Overweight Increased incidence by 20%  30-34.9 kg/m2 Obese (Class I) Increased incidence by 68%  35-39.9 kg/m2 Severe obesity (Class II) Increased incidence by 136%  >40 kg/m2 Extreme obesity (Class III) Increased incidence by 254%   Patient's current BMI Ideal Body weight  Body mass index is 35.95 kg/m. Ideal body weight: 82.2 kg (181 lb 3.5 oz) Adjusted ideal body weight: 100.1 kg (220 lb 11.7 oz)   BMI Readings from Last 4 Encounters:   08/30/17 35.95 kg/m  07/11/17 34.25 kg/m  06/15/17 34.26 kg/m  05/22/17 33.62 kg/m   Wt Readings from Last 4 Encounters:  08/30/17 280 lb (127 kg)  07/11/17 274 lb (124.3 kg)  06/15/17 274 lb 1.6 oz (124.3 kg)  05/22/17 269 lb (122 kg)  Psych/Mental status: Alert, oriented x 3 (person, place, & time)       Eyes: PERLA Respiratory: No evidence of acute respiratory distress  Cervical Spine Area Exam  Skin & Axial Inspection: No masses, redness, edema, swelling, or associated skin lesions Alignment: Symmetrical Functional ROM: Unrestricted ROM      Stability: No instability detected Muscle Tone/Strength: Functionally intact. No obvious neuro-muscular anomalies detected. Sensory (Neurological): Unimpaired Palpation: No palpable anomalies              Upper Extremity (UE) Exam    Side: Right upper extremity  Side: Left upper extremity  Skin & Extremity Inspection: Skin color, temperature, and hair growth are WNL. No peripheral edema or cyanosis. No masses, redness, swelling, asymmetry, or associated skin lesions. No contractures.  Skin & Extremity Inspection: Skin color, temperature, and hair growth are WNL. No peripheral edema or cyanosis. No masses, redness, swelling, asymmetry, or associated skin lesions. No contractures.  Functional ROM: Unrestricted ROM  Functional ROM: Unrestricted ROM          Muscle Tone/Strength: Functionally intact. No obvious neuro-muscular anomalies detected.  Muscle Tone/Strength: Functionally intact. No obvious neuro-muscular anomalies detected.  Sensory (Neurological): Unimpaired          Sensory (Neurological): Unimpaired          Palpation: No palpable anomalies              Palpation: No palpable anomalies              Provocative Test(s):  Phalen's test: deferred Tinel's test: deferred Apley's scratch test (touch opposite shoulder):  Action 1 (Across chest): deferred Action 2 (Overhead): deferred Action 3 (LB reach): deferred    Provocative Test(s):  Phalen's test: deferred Tinel's test: deferred Apley's scratch test (touch opposite shoulder):  Action 1 (Across chest): deferred Action 2 (Overhead): deferred Action 3 (LB reach): deferred    Thoracic Spine Area Exam  Skin & Axial Inspection: No masses, redness, or swelling Alignment: Symmetrical Functional ROM: Unrestricted ROM Stability: No instability detected Muscle Tone/Strength: Functionally intact. No obvious neuro-muscular anomalies detected. Sensory (Neurological): Unimpaired Muscle strength & Tone: No palpable anomalies  Lumbar Spine Area Exam  Skin & Axial Inspection: No masses, redness, or swelling Alignment: Symmetrical Functional ROM: Unrestricted ROM       Stability: No instability detected Muscle Tone/Strength: Functionally intact. No obvious neuro-muscular anomalies detected. Sensory (Neurological): Unimpaired Palpation: No palpable anomalies       Provocative Tests: Lumbar Hyperextension/rotation test: Positive bilaterally for facet joint pain. Lumbar quadrant test (Kemp's test): deferred today       Lumbar Lateral bending test: (+) due to pain. Patrick's Maneuver: (+) for bilateral S-I arthralgia             FABER test: deferred today       Thigh-thrust test: deferred today       S-I compression test: deferred today       S-I distraction test: deferred today        Gait & Posture Assessment  Ambulation: Unassisted Gait: Relatively normal for age and body habitus Posture: WNL   Lower Extremity Exam    Side: Right lower extremity  Side: Left lower extremity  Stability: No instability observed          Stability: No instability observed          Skin & Extremity Inspection: Evidence of prior arthroplastic surgery  Skin & Extremity Inspection: Evidence of prior arthroplastic surgery  Functional ROM: Decreased ROM for knee joint          Functional ROM: Decreased ROM for knee joint          Muscle Tone/Strength: Functionally intact.  No obvious neuro-muscular anomalies detected.  Muscle Tone/Strength: Functionally intact. No obvious neuro-muscular anomalies detected.  Sensory (Neurological): Arthropathic arthralgia  Sensory (Neurological): Arthropathic arthralgia  Palpation: No palpable anomalies  Palpation: No palpable anomalies   Assessment  Primary Diagnosis & Pertinent Problem List: The primary encounter diagnosis was Lumbar spondylosis. Diagnoses of DDD (degenerative disc disease), lumbar, Primary osteoarthritis of both knees, History of bilateral knee replacement, Chronic bilateral low back pain without sciatica, and Chronic pain syndrome were also pertinent to this visit.  Status Diagnosis  Controlled Controlled Controlled 1. Lumbar spondylosis   2. DDD (degenerative disc disease), lumbar   3. Primary osteoarthritis of both knees   4. History of bilateral knee replacement   5. Chronic bilateral low back pain without sciatica   6. Chronic pain  syndrome      General Recommendations: The pain condition that the patient suffers from is best treated with a multidisciplinary approach that involves an increase in physical activity to prevent de-conditioning and worsening of the pain cycle, as well as psychological counseling (formal and/or informal) to address the co-morbid psychological affects of pain. Treatment will often involve judicious use of pain medications and interventional procedures to decrease the pain, allowing the patient to participate in the physical activity that will ultimately produce long-lasting pain reductions. The goal of the multidisciplinary approach is to return the patient to a higher level of overall function and to restore their ability to perform activities of daily living.  67 year old male who presents with a chief complaint of axial low back pain, right hip pain, bilateral knee pain. Patient's low back pain is secondary to lumbar degenerative disc disease, lumbar facet arthropathy most  pronounced at L3, L4, L5. 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 We discussed treatment options for this which would include lumbar medial branch nerve block at L3, L4, L5 bilaterally and/or bilateral SI joint injection. Patient would like to hold off on these procedures for the time being.  Patient returns today for medication refill. Overall patient is doing well.  He returned from his trip to [place].  He states that his knees and his low back are at baseline.  Stable on his current medication regimen.  Plan: -Refill hydrocodone at current dose of 10 mg 3 times daily as needed.  For 3 months.  No further dose escalation beyond this  -refill gabapentin as below.  Future considerations: Lumbar facet medial branch nerve blocks at L3, L4, L5, bilateral SI joint injections, bilateral genicular nerve block   Plan of Care  Pharmacotherapy (Medications Ordered): Meds ordered this encounter  Medications  . gabapentin (NEURONTIN) 300 MG capsule    Sig: Take 1 capsule (300 mg total) by mouth 3 (three) times daily.    Dispense:  90 capsule    Refill:  4  . DISCONTD: HYDROcodone-acetaminophen (NORCO) 10-325 MG tablet    Sig: Take 1 tablet by mouth 3 (three) times daily as needed. For chronic pain To last for 30 days from fill date To fill on or after: 09/16/17, 10/15/17, 11/14/17 Ok to fill 1 day early if pharmacy closed on fill date    Dispense:  90 tablet    Refill:  0  . DISCONTD: HYDROcodone-acetaminophen (NORCO) 10-325 MG tablet    Sig: Take 1 tablet by mouth 3 (three) times daily as needed. For chronic pain To last for 30 days from fill date To fill on or after: 09/16/17, 10/15/17, 11/14/17 Ok to fill 1 day early if pharmacy closed on fill date    Dispense:  90 tablet    Refill:  0  . HYDROcodone-acetaminophen (NORCO) 10-325 MG tablet    Sig: Take 1 tablet by mouth 3 (three) times daily as needed. For chronic pain To last for 30 days from fill date To fill on  or after: 09/16/17, 10/15/17, 11/14/17 Ok to fill 1 day early if pharmacy closed on fill date    Dispense:  90 tablet    Refill:  0   Time Note: Greater than 50% of the 25 minute(s) of face-to-face time spent with Mr. Holloran, was spent in counseling/coordination of care regarding: Mr. Macauley primary cause of pain, medication side effects, the opioid analgesic risks and possible complications, the appropriate use of his medications, realistic expectations, the medication agreement  and the patient's responsibilities when it comes to controlled substances.  Provider-requested follow-up: Return in about 3 months (around 11/30/2017) for Medication Management.  Future Appointments  Date Time Provider [place]  09/17/2017  8:20 AM Fredderick Severance, NP [facility]  11/29/2017  8:30 AM Gillis Santa, MD [facility] None    Primary Care Physician: Hubbard Hartshorn, FNP Location: [facility] Note by: Gillis Santa, M.D Date: 08/30/2017; Time: 9:06 AM  Patient Instructions  You have been given 3 scripts for Hydrocodone today.

## 2017-09-14 ENCOUNTER — Telehealth: Payer: Self-pay

## 2017-09-14 NOTE — Telephone Encounter (Signed)
Called to schedule AWV w/ [facility]. LVM requesting returned call.

## 2017-09-17 ENCOUNTER — Ambulatory Visit: Payer: Medicare PPO | Admitting: Nurse Practitioner

## 2017-09-17 ENCOUNTER — Encounter: Payer: Self-pay | Admitting: Nurse Practitioner

## 2017-09-17 ENCOUNTER — Other Ambulatory Visit: Payer: Self-pay | Admitting: Nurse Practitioner

## 2017-09-17 VITALS — BP 124/76 | HR 82 | Temp 98.2°F | Resp 18 | Ht 74.0 in | Wt 280.4 lb

## 2017-09-17 DIAGNOSIS — M1A4720 Other secondary chronic gout, left ankle and foot, without tophus (tophi): Secondary | ICD-10-CM

## 2017-09-17 DIAGNOSIS — R600 Localized edema: Secondary | ICD-10-CM

## 2017-09-17 DIAGNOSIS — E7800 Pure hypercholesterolemia, unspecified: Secondary | ICD-10-CM

## 2017-09-17 DIAGNOSIS — Z5181 Encounter for therapeutic drug level monitoring: Secondary | ICD-10-CM | POA: Diagnosis not present

## 2017-09-17 DIAGNOSIS — I10 Essential (primary) hypertension: Secondary | ICD-10-CM | POA: Diagnosis not present

## 2017-09-17 DIAGNOSIS — Z23 Encounter for immunization: Secondary | ICD-10-CM | POA: Diagnosis not present

## 2017-09-17 DIAGNOSIS — N400 Enlarged prostate without lower urinary tract symptoms: Secondary | ICD-10-CM | POA: Diagnosis not present

## 2017-09-17 DIAGNOSIS — M2550 Pain in unspecified joint: Secondary | ICD-10-CM | POA: Diagnosis not present

## 2017-09-17 DIAGNOSIS — E119 Type 2 diabetes mellitus without complications: Secondary | ICD-10-CM | POA: Diagnosis not present

## 2017-09-17 DIAGNOSIS — Z125 Encounter for screening for malignant neoplasm of prostate: Secondary | ICD-10-CM

## 2017-09-17 MED ORDER — METOPROLOL SUCCINATE ER 100 MG PO TB24: 100.0000 mg | ORAL_TABLET | ORAL | 0 refills | Status: DC

## 2017-09-17 MED ORDER — NABUMETONE 500 MG PO TABS: 500.0000 mg | ORAL_TABLET | Freq: Two times a day (BID) | ORAL | 0 refills | Status: DC

## 2017-09-17 MED ORDER — AMLODIPINE BESYLATE 10 MG PO TABS: 10.0000 mg | ORAL_TABLET | Freq: Every day | ORAL | 0 refills | Status: DC

## 2017-09-17 MED ORDER — TAMSULOSIN HCL 0.4 MG PO CAPS: 0.4000 mg | ORAL_CAPSULE | Freq: Every day | ORAL | 0 refills | Status: DC

## 2017-09-17 MED ORDER — METOPROLOL SUCCINATE ER 25 MG PO TB24: 25.0000 mg | ORAL_TABLET | ORAL | 0 refills | Status: DC

## 2017-09-17 MED ORDER — HYDROCHLOROTHIAZIDE 25 MG PO TABS: 25.0000 mg | ORAL_TABLET | Freq: Every day | ORAL | 0 refills | Status: DC

## 2017-09-17 MED ORDER — METFORMIN HCL ER 500 MG PO TB24: 1000.0000 mg | ORAL_TABLET | Freq: Two times a day (BID) | ORAL | 0 refills | Status: DC

## 2017-09-17 NOTE — Progress Notes (Addendum)
Name: Evan Rivas   MRN: [id number]    DOB: 08-Mar-1951   Date:09/18/2017       Progress Note  Subjective  Chief Complaint  Chief Complaint  Patient presents with  . Diabetes    3 month reheck,medication refills  . Hyperlipidemia  . Hypertension    HPI  Hypertension Patient is on amlodipine, hctz, losartan and metoprolol .  Takes medications as prescribed with no missed doses a month.  Relatively compliant with low-salt diet.  Doeschecks blood pressures at home every Monday but states cuff doesn't seem accurate. Discussed bringing it to next appointment.  Denies chest pain, headaches, blurry vision. Goes to the gym a lot- lifts weights, rides bike every day for 25 minutes, switches it up.  Hyperlipidemia Patient rx atorvastatin Takes medications as prescribed with no missed doses a month.  Diet: about 10 vegetable a week. Eats out 1-2 times a week. Fried foods- occasionally  Denies myalgias  Diabetes Mellitus Patient is rx glipizide, metformin. Takes medications as prescribed with no missed doses a month.  Diet: Checks blood sugars 2-3 times a day. Fasting Ranging from 140-220 with an average of 160's Denies polyphagia, polydipsia, polyuria. Patient sts had two episodes of mild hypoglycemia felt shaky and sweaty and forgot to eat- sugars were in the 60's, ate a snack and felt better. States last eye exam was a longtime ago.   Depression screen [facility] 2/9 09/17/2017  Decreased Interest 0  Down, Depressed, Hopeless 0  PHQ - 2 Score 0  Altered sleeping 0  Tired, decreased energy 1  Change in appetite 0  Feeling bad or failure about yourself  0  Trouble concentrating 0  Moving slowly or fidgety/restless 0  Suicidal thoughts 0  PHQ-9 Score 1  Difficult doing work/chores Not difficult at all   IPSS Questionnaire (AUA-7): Over the past month.   1)  How often have you had a sensation of not emptying your bladder completely after you finish urinating?  0 - Not at all  2)   How often have you had to urinate again less than two hours after you finished urinating? 1 - Less than 1 time in 5  3)  How often have you found you stopped and started again several times when you urinated?  0 - Not at all  4) How difficult have you found it to postpone urination?  0 - Not at all  5) How often have you had a weak urinary stream?  2 - Less than half the time  6) How often have you had to push or strain to begin urination?  0 - Not at all  7) How many times did you most typically get up to urinate from the time you went to bed until the time you got up in the morning?  1 - 1 time  Total score: 4 0-7 mildly symptomatic   8-19 moderately symptomatic   20-35 severely symptomatic     Patient Active Problem List   Diagnosis Date Noted  . Pneumonia 07/19/2016  . BPH without urinary obstruction 06/03/2015  . Depression 03/05/2015  . Hyperlipidemia 09/03/2014  . Major depressive disorder, recurrent episode, moderate ([place]) 09/03/2014  . Chronic low back pain 09/03/2014  . Arthralgia of both knees 09/03/2014  . Type 2 diabetes mellitus without complication ([place]) 62/13/0865  . Allergic rhinitis 07/04/2014  . Anxiety 07/04/2014  . Continuous opioid dependence ([place]) 07/04/2014  . DDD (degenerative disc disease), lumbar 07/04/2014  . Gout of foot 07/04/2014  .  Essential hypertension 07/04/2014  . Flu vaccine need 07/04/2014  . Adiposity 07/04/2014  . OA (osteoarthritis) of knee 07/04/2014  . Neuralgia neuritis, sciatic nerve 07/04/2014  . Arthralgia of multiple joints 07/04/2014  . H/O malignant neoplasm of skin 12/24/2013    Past Medical History:  Diagnosis Date  . Anxiety    panic attacks  . Arthritis   . Cancer (HCC)    lip  . Depression   . Diabetes mellitus without complication ([place])    type 2  . GERD (gastroesophageal reflux disease)   . Gout   . History of kidney stones   . Hyperlipidemia   . Hypertension   . Pneumonia    2014 and 2018 ( 2018-klebsiella  pneumonia     Past Surgical History:  Procedure Laterality Date  . ARTHROSCOPIC REPAIR ACL Left   . CYSTOSCOPY W/ URETERAL STENT PLACEMENT Right 09/07/2014   Procedure: CYSTOSCOPY WITH STENT REPLACEMENT;  Surgeon: Hollice Espy, MD;  Location: [facility];  Service: Urology;  Laterality: Right;  . CYSTOSCOPY/URETEROSCOPY/HOLMIUM LASER/STENT PLACEMENT Right 08/17/2014   Procedure: CYSTOSCOPY/URETEROSCOPY//STENT PLACEMENT/ attempt of lithotripsy;  Surgeon: Hollice Espy, MD;  Location: [facility];  Service: Urology;  Laterality: Right;  . HERNIA REPAIR    . MOHS SURGERY     lip  . TOTAL KNEE ARTHROPLASTY Left 04/24/2016   Procedure: LEFT TOTAL KNEE ARTHROPLASTY;  Surgeon: Gaynelle Arabian, MD;  Location: [facility];  Service: Orthopedics;  Laterality: Left;  Adductor Block  . TOTAL KNEE ARTHROPLASTY Right 10/23/2016   Procedure: RIGHT TOTAL KNEE ARTHROPLASTY;  Surgeon: Gaynelle Arabian, MD;  Location: [facility];  Service: Orthopedics;  Laterality: Right;  . URETEROSCOPY WITH HOLMIUM LASER LITHOTRIPSY Right 09/07/2014   Procedure: URETEROSCOPY WITH HOLMIUM LASER LITHOTRIPSY;  Surgeon: Hollice Espy, MD;  Location: [facility];  Service: Urology;  Laterality: Right;  [name] VASECTOMY      Social History   Tobacco Use  . Smoking status: Never Smoker  . Smokeless tobacco: Never Used  Substance Use Topics  . Alcohol use: No     Current Outpatient Medications:  .  allopurinol (ZYLOPRIM) 300 MG tablet, Take 1 tablet (300 mg total) by mouth every evening., Disp: 90 tablet, Rfl: 1 .  amLODipine (NORVASC) 10 MG tablet, Take 1 tablet (10 mg total) by mouth daily., Disp: 90 tablet, Rfl: 0 .  ASPIRIN 81 PO, Take by mouth., Disp: , Rfl:  .  atorvastatin (LIPITOR) 40 MG tablet, Take 1 tablet (40 mg total) by mouth at bedtime., Disp: 90 tablet, Rfl: 1 .  FLUoxetine (PROZAC) 40 MG capsule, Take 1 capsule (40 mg total) by mouth daily., Disp: 90 capsule, Rfl: 1 .  fluticasone (FLONASE) 50 MCG/ACT nasal spray, Place 2 sprays  into both nostrils daily. 2 sprays, Disp: 16 g, Rfl: 1 .  gabapentin (NEURONTIN) 300 MG capsule, Take 1 capsule (300 mg total) by mouth 3 (three) times daily., Disp: 90 capsule, Rfl: 4 .  glipiZIDE (GLUCOTROL) 5 MG tablet, Take 1 tablet (5 mg total) by mouth 2 (two) times daily before a meal., Disp: 180 tablet, Rfl: 1 .  glucose blood (ACCU-CHEK AVIVA PLUS) test strip, Use as instructed, Disp: 100 each, Rfl: 12 .  hydrochlorothiazide (HYDRODIURIL) 25 MG tablet, Take 1 tablet (25 mg total) by mouth daily., Disp: 90 tablet, Rfl: 0 .  HYDROcodone-acetaminophen (NORCO) 10-325 MG tablet, Take 1 tablet by mouth 3 (three) times daily as needed. For chronic pain To last for 30 days from fill date To fill on or after:  09/16/17, 10/15/17, 11/14/17 Ok to fill 1 day early if pharmacy closed on fill date, Disp: 90 tablet, Rfl: 0 .  hydrOXYzine (ATARAX/VISTARIL) 10 MG tablet, Take 1 tablet (10 mg total) by mouth every 8 (eight) hours as needed for anxiety., Disp: 20 tablet, Rfl: 0 .  losartan (COZAAR) 100 MG tablet, Take 1 tablet (100 mg total) by mouth at bedtime., Disp: 90 tablet, Rfl: 1 .  metFORMIN (GLUCOPHAGE-XR) 500 MG 24 hr tablet, Take 2 tablets (1,000 mg total) by mouth 2 (two) times daily. 109m BID, Disp: 360 tablet, Rfl: 0 .  methocarbamol (ROBAXIN) 500 MG tablet, Take 1 tablet (500 mg total) by mouth every 6 (six) hours as needed for muscle spasms., Disp: 80 tablet, Rfl: 0 .  metoprolol succinate (TOPROL-XL) 100 MG 24 hr tablet, Take 1 tablet (100 mg total) by mouth every morning., Disp: 90 tablet, Rfl: 0 .  metoprolol succinate (TOPROL-XL) 25 MG 24 hr tablet, Take 1 tablet (25 mg total) by mouth every morning., Disp: 90 tablet, Rfl: 0 .  nabumetone (RELAFEN) 500 MG tablet, Take 1 tablet (500 mg total) by mouth 2 (two) times daily., Disp: 180 tablet, Rfl: 0 .  tamsulosin (FLOMAX) 0.4 MG CAPS capsule, Take 1 capsule (0.4 mg total) by mouth daily after supper., Disp: 90 capsule, Rfl: 0 .  tiZANidine  (ZANAFLEX) 4 MG tablet, Take 1 tablet (4 mg total) by mouth at bedtime., Disp: 90 tablet, Rfl: 0 .  empagliflozin (JARDIANCE) 10 MG TABS tablet, Take 10 mg by mouth daily., Disp: 30 tablet, Rfl: 0  Allergies  Allergen Reactions  . Celebrex  [Celecoxib]     GI  . Tetanus Toxoids Other (See Comments)    unkown  . Tetanus-Diphtheria Toxoids Td     Other reaction(s): Unknown    ROS  Constitutional: Negative for fever or weight change.  Respiratory: Negative for cough and shortness of breath.   Cardiovascular: Negative for chest pain or palpitations.  Gastrointestinal: Negative for abdominal pain, no bowel changes.  Musculoskeletal: Negative for gait problem or joint swelling.  Skin: Negative for rash.  Neurological: Negative for dizziness or headache.  No other specific complaints in a complete review of systems (except as listed in HPI above).  Objective  Vitals:   09/17/17 0827  BP: 124/76  Pulse: 82  Resp: 18  Temp: 98.2 F (36.8 C)  TempSrc: Oral  SpO2: 94%  Weight: 280 lb 6.4 oz (127.2 kg)  Height: '6\' 2"'  (1.88 m)    Body mass index is 36 kg/m.  Nursing Note and Vital Signs reviewed.  Physical Exam   Constitutional: Patient appears well-developed and well-nourished. Obese  No distress.  Cardiovascular: Normal rate, regular rhythm, S1/S2 present.  No murmur or rub heard. Pulses intact, no carotid bruits.  Pulmonary/Chest: Effort normal and breath sounds clear. No respiratory distress or retractions. Abdominal: Soft and non-tender, bowel sounds present  Psychiatric: Patient has a normal mood and affect. behavior is normal. Judgment and thought content normal.  Results for orders placed or performed in visit on 09/17/17 (from the past 72 hour(s))  Uric acid     Status: None   Collection Time: 09/17/17 10:12 AM  Result Value Ref Range   Uric Acid 4.9 3.7 - 8.6 mg/dL    Comment:            Therapeutic target for gout patients: <6.0  HgB A1c     Status: Abnormal    Collection Time: 09/17/17 10:12 AM  Result Value Ref  Range   Hgb A1c MFr Bld 7.4 (H) 4.8 - 5.6 %    Comment:          Prediabetes: 5.7 - 6.4          Diabetes: >6.4          Glycemic control for adults with diabetes: <7.0    Est. average glucose Bld gHb Est-mCnc 166 mg/dL  PSA     Status: None   Collection Time: 09/17/17 10:13 AM  Result Value Ref Range   Prostate Specific Ag, Serum 0.8 0.0 - 4.0 ng/mL    Comment: Roche ECLIA methodology. According to the American Urological Association, Serum PSA should decrease and remain at undetectable levels after radical prostatectomy. The AUA defines biochemical recurrence as an initial PSA value 0.2 ng/mL or greater followed by a subsequent confirmatory PSA value 0.2 ng/mL or greater. Values obtained with different assay methods or kits cannot be used interchangeably. Results cannot be interpreted as absolute evidence of the presence or absence of malignant disease.     Assessment & Plan .diagfmed  1. Type 2 diabetes mellitus without complication, without long-term current use of insulin (HCC) -discussed diet, cont exercise, discussed switching out glipizde due to hypoglycemic events if a1c is not at goal or events cont.  - metFORMIN (GLUCOPHAGE-XR) 500 MG 24 hr tablet; Take 2 tablets (1,000 mg total) by mouth 2 (two) times daily. 1068m BID  Dispense: 360 tablet; Refill: 0 - Pneumococcal conjugate vaccine 13-valent - HgB A1c  2. Essential hypertension -stable, cont dash, bring in home cuff to check accuracy at next visit.  - metoprolol succinate (TOPROL-XL) 25 MG 24 hr tablet; Take 1 tablet (25 mg total) by mouth every morning.  Dispense: 90 tablet; Refill: 0 - metoprolol succinate (TOPROL-XL) 100 MG 24 hr tablet; Take 1 tablet (100 mg total) by mouth every morning.  Dispense: 90 tablet; Refill: 0 - hydrochlorothiazide (HYDRODIURIL) 25 MG tablet; Take 1 tablet (25 mg total) by mouth daily.  Dispense: 90 tablet; Refill: 0 - amLODipine  (NORVASC) 10 MG tablet; Take 1 tablet (10 mg total) by mouth daily.  Dispense: 90 tablet; Refill: 0 - CMP14+EGFR  3. BPH without urinary obstruction -stable working well  - tamsulosin (FLOMAX) 0.4 MG CAPS capsule; Take 1 capsule (0.4 mg total) by mouth daily after supper.  Dispense: 90 capsule; Refill: 0  4. Pure hypercholesterolemia -discussed diet, cont med management  - Lipid Panel With LDL/HDL Ratio  5. Arthralgia of multiple joints - nabumetone (RELAFEN) 500 MG tablet; Take 1 tablet (500 mg total) by mouth 2 (two) times daily.  Dispense: 180 tablet; Refill: 0  6. Bilateral lower extremity edema -elevation, compression, low-salt. Pt politely declines vascular referral  - hydrochlorothiazide (HYDRODIURIL) 25 MG tablet; Take 1 tablet (25 mg total) by mouth daily.  Dispense: 90 tablet; Refill: 0  7. 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 Medication monitoring encounter - CMP14+EGFR  9. Screening PSA (prostate specific antigen) - would like PSA checked, had scare with 2 friends having prostate cancer recently  - PSA  10. Need for pneumococcal vaccination - Pneumococcal conjugate vaccine 13-valent  Follow up and care instructions discussed and provided in AVS. -Reviewed Health Maintenance: plans to go to eye doctor. Discussed pneumovax.    --------------------------------- I have reviewed this encounter including the documentation in this note and/or discussed this patient with the provider, ESuezanne CheshireDNP AGNP-C. I  am certifying that I agree with the content of this note as supervising physician. Enid Derry, [facility] 09/18/2017, 5:30 PM

## 2017-09-17 NOTE — Patient Instructions (Addendum)
- Please see the eye doctor by the end of July and ask them to share their notes with [place].    DASH Eating Plan DASH stands for "Dietary Approaches to Stop Hypertension." The DASH eating plan is a healthy eating plan that has been shown to reduce high blood pressure (hypertension). It may also reduce your risk for type 2 diabetes, heart disease, and stroke. The DASH eating plan may also help with weight loss. What are tips for following this plan? General guidelines  Avoid eating more than 2,300 mg (milligrams) of salt (sodium) a day. If you have hypertension, you may need to reduce your sodium intake to 1,500 mg a day.  Limit alcohol intake to no more than 1 drink a day for nonpregnant women and 2 drinks a day for men. One drink equals 12 oz of beer, 5 oz of wine, or 1 oz of hard liquor.  Work with your health care provider to maintain a healthy body weight or to lose weight. Ask what an ideal weight is for you.  Get at least 30 minutes of exercise that causes your heart to beat faster (aerobic exercise) most days of the week. Activities may include walking, swimming, or biking.  Work with your health care provider or diet and nutrition specialist (dietitian) to adjust your eating plan to your individual calorie needs. Reading food labels  Check food labels for the amount of sodium per serving. Choose foods with less than 5 percent of the Daily Value of sodium. Generally, foods with less than 300 mg of sodium per serving fit into this eating plan.  To find whole grains, look for the word "whole" as the first word in the ingredient list. Shopping  Buy products labeled as "low-sodium" or "no salt added."  Buy fresh foods. Avoid canned foods and premade or frozen meals. Cooking  Avoid adding salt when cooking. Use salt-free seasonings or herbs instead of table salt or sea salt. Check with your health care provider or pharmacist before using salt substitutes.  Do not fry foods. Cook foods  using healthy methods such as baking, boiling, grilling, and broiling instead.  Cook with heart-healthy oils, such as olive, canola, soybean, or sunflower oil. Meal planning   Eat a balanced diet that includes: ? 5 or more servings of fruits and vegetables each day. At each meal, try to fill half of your plate with fruits and vegetables. ? Up to 6-8 servings of whole grains each day. ? Less than 6 oz of lean meat, poultry, or fish each day. A 3-oz serving of meat is about the same size as a deck of cards. One egg equals 1 oz. ? 2 servings of low-fat dairy each day. ? A serving of nuts, seeds, or beans 5 times each week. ? Heart-healthy fats. Healthy fats called Omega-3 fatty acids are found in foods such as flaxseeds and coldwater fish, like sardines, salmon, and mackerel.  Limit how much you eat of the following: ? Canned or prepackaged foods. ? Food that is high in trans fat, such as fried foods. ? Food that is high in saturated fat, such as fatty meat. ? Sweets, desserts, sugary drinks, and other foods with added sugar. ? Full-fat dairy products.  Do not salt foods before eating.  Try to eat at least 2 vegetarian meals each week.  Eat more home-cooked food and less restaurant, buffet, and fast food.  When eating at a restaurant, ask that your food be prepared with less salt or  no salt, if possible. What foods are recommended? The items listed may not be a complete list. Talk with your dietitian about what dietary choices are best for you. Grains Whole-grain or whole-wheat bread. Whole-grain or whole-wheat pasta. Brown rice. [place]. Bulgur. Whole-grain and low-sodium cereals. Pita bread. Low-fat, low-sodium crackers. Whole-wheat flour tortillas. Vegetables Fresh or frozen vegetables (raw, steamed, roasted, or grilled). Low-sodium or reduced-sodium tomato and vegetable juice. Low-sodium or reduced-sodium tomato sauce and tomato paste. Low-sodium or reduced-sodium canned  vegetables. Fruits All fresh, dried, or frozen fruit. Canned fruit in natural juice (without added sugar). Meat and other protein foods Skinless chicken or [place]. Ground chicken or [place]. Pork with fat trimmed off. Fish and seafood. Egg whites. Dried beans, peas, or lentils. Unsalted nuts, nut butters, and seeds. Unsalted canned beans. Lean cuts of beef with fat trimmed off. Low-sodium, lean deli meat. Dairy Low-fat (1%) or fat-free (skim) milk. Fat-free, low-fat, or reduced-fat cheeses. Nonfat, low-sodium ricotta or cottage cheese. Low-fat or nonfat yogurt. Low-fat, low-sodium cheese. Fats and oils Soft margarine without trans fats. Vegetable oil. Low-fat, reduced-fat, or light mayonnaise and salad dressings (reduced-sodium). Canola, safflower, olive, soybean, and sunflower oils. Avocado. Seasoning and other foods Herbs. Spices. Seasoning mixes without salt. Unsalted popcorn and pretzels. Fat-free sweets. What foods are not recommended? The items listed may not be a complete list. Talk with your dietitian about what dietary choices are best for you. Grains Baked goods made with fat, such as croissants, muffins, or some breads. Dry pasta or rice meal packs. Vegetables Creamed or fried vegetables. Vegetables in a cheese sauce. Regular canned vegetables (not low-sodium or reduced-sodium). Regular canned tomato sauce and paste (not low-sodium or reduced-sodium). Regular tomato and vegetable juice (not low-sodium or reduced-sodium). Angie Fava. Olives. Fruits Canned fruit in a light or heavy syrup. Fried fruit. Fruit in cream or butter sauce. Meat and other protein foods Fatty cuts of meat. Ribs. Fried meat. Berniece Salines. Sausage. Bologna and other processed lunch meats. Salami. Fatback. Hotdogs. Bratwurst. Salted nuts and seeds. Canned beans with added salt. Canned or smoked fish. Whole eggs or egg yolks. Chicken or [place] with skin. Dairy Whole or 2% milk, cream, and half-and-half. Whole or full-fat  cream cheese. Whole-fat or sweetened yogurt. Full-fat cheese. Nondairy creamers. Whipped toppings. Processed cheese and cheese spreads. Fats and oils Butter. Stick margarine. Lard. Shortening. Ghee. Bacon fat. Tropical oils, such as coconut, palm kernel, or palm oil. Seasoning and other foods Salted popcorn and pretzels. Onion salt, garlic salt, seasoned salt, table salt, and sea salt. Worcestershire sauce. Tartar sauce. Barbecue sauce. Teriyaki sauce. Soy sauce, including reduced-sodium. Steak sauce. Canned and packaged gravies. Fish sauce. Oyster sauce. Cocktail sauce. Horseradish that you find on the shelf. Ketchup. Mustard. Meat flavorings and tenderizers. Bouillon cubes. Hot sauce and Tabasco sauce. Premade or packaged marinades. Premade or packaged taco seasonings. Relishes. Regular salad dressings. Where to find more information:  National Heart, Lung, and [place]: https://wilson-eaton.com/  American Heart Association: www.heart.org Summary  The DASH eating plan is a healthy eating plan that has been shown to reduce high blood pressure (hypertension). It may also reduce your risk for type 2 diabetes, heart disease, and stroke.  With the DASH eating plan, you should limit salt (sodium) intake to 2,300 mg a day. If you have hypertension, you may need to reduce your sodium intake to 1,500 mg a day.  When on the DASH eating plan, aim to eat more fresh fruits and vegetables, whole grains, lean proteins, low-fat dairy, and  heart-healthy fats.  Work with your health care provider or diet and nutrition specialist (dietitian) to adjust your eating plan to your individual calorie needs. This information is not intended to replace advice given to you by your health care provider. Make sure you discuss any questions you have with your health care provider. Document Released: 03/09/2011 Document Revised: 03/13/2016 Document Reviewed: 03/13/2016 Elsevier Interactive Patient Education  [employer].

## 2017-09-18 ENCOUNTER — Other Ambulatory Visit: Payer: Self-pay | Admitting: Nurse Practitioner

## 2017-09-18 DIAGNOSIS — E119 Type 2 diabetes mellitus without complications: Secondary | ICD-10-CM

## 2017-09-18 LAB — CMP14+EGFR
ALK PHOS: 84 IU/L (ref 39–117)
ALT: 16 IU/L (ref 0–44)
AST: 13 IU/L (ref 0–40)
Albumin/Globulin Ratio: 1.5 (ref 1.2–2.2)
Albumin: 4 g/dL (ref 3.6–4.8)
BUN/Creatinine Ratio: 22 (ref 10–24)
BUN: 24 mg/dL (ref 8–27)
Bilirubin Total: 0.3 mg/dL (ref 0.0–1.2)
CALCIUM: 9.3 mg/dL (ref 8.6–10.2)
CO2: 28 mmol/L (ref 20–29)
CREATININE: 1.09 mg/dL (ref 0.76–1.27)
Chloride: 100 mmol/L (ref 96–106)
GFR calc Af Amer: 81 mL/min/{1.73_m2} (ref >59)
GFR, EST NON AFRICAN AMERICAN: 70 mL/min/{1.73_m2} (ref >59)
GLUCOSE: 213 mg/dL — AB (ref 65–99)
Globulin, Total: 2.6 g/dL (ref 1.5–4.5)
Potassium: 3.2 mmol/L — ABNORMAL LOW (ref 3.5–5.2)
Sodium: 146 mmol/L — ABNORMAL HIGH (ref 134–144)
Total Protein: 6.6 g/dL (ref 6.0–8.5)

## 2017-09-18 LAB — HEMOGLOBIN A1C
ESTIMATED AVERAGE GLUCOSE: 166 mg/dL
Hgb A1c MFr Bld: 7.4 % — ABNORMAL HIGH (ref 4.8–5.6)

## 2017-09-18 LAB — LIPID PANEL WITH LDL/HDL RATIO
CHOLESTEROL TOTAL: 140 mg/dL (ref 100–199)
HDL: 32 mg/dL — AB (ref >39)
LDL CALC: 80 mg/dL (ref 0–99)
LDl/HDL Ratio: 2.5 ratio (ref 0.0–3.6)
TRIGLYCERIDES: 141 mg/dL (ref 0–149)
VLDL CHOLESTEROL CAL: 28 mg/dL (ref 5–40)

## 2017-09-18 LAB — URIC ACID: Uric Acid: 4.9 mg/dL (ref 3.7–8.6)

## 2017-09-18 LAB — PSA: Prostate Specific Ag, Serum: 0.8 ng/mL (ref 0.0–4.0)

## 2017-09-18 MED ORDER — EMPAGLIFLOZIN 10 MG PO TABS: 10.0000 mg | ORAL_TABLET | Freq: Every day | ORAL | 0 refills | Status: DC

## 2017-10-05 ENCOUNTER — Other Ambulatory Visit: Payer: Self-pay

## 2017-10-05 DIAGNOSIS — I10 Essential (primary) hypertension: Secondary | ICD-10-CM

## 2017-10-05 DIAGNOSIS — M1A4720 Other secondary chronic gout, left ankle and foot, without tophus (tophi): Secondary | ICD-10-CM

## 2017-10-05 MED ORDER — LOSARTAN POTASSIUM 100 MG PO TABS: 100.0000 mg | ORAL_TABLET | Freq: Every day | ORAL | 1 refills | Status: DC

## 2017-10-05 MED ORDER — ALLOPURINOL 300 MG PO TABS: 300.0000 mg | ORAL_TABLET | Freq: Every evening | ORAL | 1 refills | Status: DC

## 2017-10-05 NOTE — Telephone Encounter (Addendum)
Refill request for general medication: Allopurinol 300 mg  Last office visit: 09/17/2017  Last physical exam: None indicated  Lab Results  Component Value Date   LABURIC 4.9 09/17/2017    Refill request for Hypertension medication:  Losartan  Last office visit pertaining to hypertension: 09/17/2017  BP Readings from Last 3 Encounters:  09/17/17 124/76  08/30/17 (!) 164/84  07/11/17 (!) 176/84   Lab Results  Component Value Date   CREATININE 1.09 09/17/2017   BUN 24 09/17/2017   NA 146 (H) 09/17/2017   K 3.2 (L) 09/17/2017   CL 100 09/17/2017   CO2 28 09/17/2017   Follow-ups on file. 0917/2019

## 2017-11-27 ENCOUNTER — Other Ambulatory Visit: Payer: Self-pay

## 2017-11-27 ENCOUNTER — Ambulatory Visit
Payer: Medicare PPO | Attending: Student in an Organized Health Care Education/Training Program | Admitting: Student in an Organized Health Care Education/Training Program

## 2017-11-27 ENCOUNTER — Encounter: Payer: Self-pay | Admitting: Student in an Organized Health Care Education/Training Program

## 2017-11-27 VITALS — BP 178/83 | HR 67 | Temp 98.7°F | Resp 18 | Ht 74.5 in | Wt 281.0 lb

## 2017-11-27 DIAGNOSIS — M25551 Pain in right hip: Secondary | ICD-10-CM | POA: Diagnosis present

## 2017-11-27 DIAGNOSIS — M5116 Intervertebral disc disorders with radiculopathy, lumbar region: Secondary | ICD-10-CM | POA: Insufficient documentation

## 2017-11-27 DIAGNOSIS — E669 Obesity, unspecified: Secondary | ICD-10-CM | POA: Insufficient documentation

## 2017-11-27 DIAGNOSIS — N400 Enlarged prostate without lower urinary tract symptoms: Secondary | ICD-10-CM | POA: Insufficient documentation

## 2017-11-27 DIAGNOSIS — Z96653 Presence of artificial knee joint, bilateral: Secondary | ICD-10-CM | POA: Diagnosis not present

## 2017-11-27 DIAGNOSIS — Z7982 Long term (current) use of aspirin: Secondary | ICD-10-CM | POA: Insufficient documentation

## 2017-11-27 DIAGNOSIS — M25561 Pain in right knee: Secondary | ICD-10-CM

## 2017-11-27 DIAGNOSIS — Z79899 Other long term (current) drug therapy: Secondary | ICD-10-CM | POA: Insufficient documentation

## 2017-11-27 DIAGNOSIS — Z6835 Body mass index (BMI) 35.0-35.9, adult: Secondary | ICD-10-CM | POA: Diagnosis not present

## 2017-11-27 DIAGNOSIS — G894 Chronic pain syndrome: Secondary | ICD-10-CM

## 2017-11-27 DIAGNOSIS — K219 Gastro-esophageal reflux disease without esophagitis: Secondary | ICD-10-CM | POA: Diagnosis not present

## 2017-11-27 DIAGNOSIS — F1120 Opioid dependence, uncomplicated: Secondary | ICD-10-CM | POA: Diagnosis not present

## 2017-11-27 DIAGNOSIS — M109 Gout, unspecified: Secondary | ICD-10-CM | POA: Insufficient documentation

## 2017-11-27 DIAGNOSIS — Z7984 Long term (current) use of oral hypoglycemic drugs: Secondary | ICD-10-CM | POA: Diagnosis not present

## 2017-11-27 DIAGNOSIS — M25552 Pain in left hip: Secondary | ICD-10-CM | POA: Insufficient documentation

## 2017-11-27 DIAGNOSIS — M545 Low back pain: Secondary | ICD-10-CM | POA: Diagnosis present

## 2017-11-27 DIAGNOSIS — I10 Essential (primary) hypertension: Secondary | ICD-10-CM | POA: Insufficient documentation

## 2017-11-27 DIAGNOSIS — M25562 Pain in left knee: Secondary | ICD-10-CM

## 2017-11-27 DIAGNOSIS — E785 Hyperlipidemia, unspecified: Secondary | ICD-10-CM | POA: Insufficient documentation

## 2017-11-27 DIAGNOSIS — M47816 Spondylosis without myelopathy or radiculopathy, lumbar region: Secondary | ICD-10-CM | POA: Diagnosis not present

## 2017-11-27 DIAGNOSIS — F419 Anxiety disorder, unspecified: Secondary | ICD-10-CM | POA: Insufficient documentation

## 2017-11-27 DIAGNOSIS — M51369 Other intervertebral disc degeneration, lumbar region without mention of lumbar back pain or lower extremity pain: Secondary | ICD-10-CM

## 2017-11-27 DIAGNOSIS — Z7951 Long term (current) use of inhaled steroids: Secondary | ICD-10-CM | POA: Insufficient documentation

## 2017-11-27 DIAGNOSIS — M4316 Spondylolisthesis, lumbar region: Secondary | ICD-10-CM | POA: Diagnosis not present

## 2017-11-27 DIAGNOSIS — Z85828 Personal history of other malignant neoplasm of skin: Secondary | ICD-10-CM | POA: Insufficient documentation

## 2017-11-27 DIAGNOSIS — M170 Bilateral primary osteoarthritis of knee: Secondary | ICD-10-CM | POA: Diagnosis not present

## 2017-11-27 DIAGNOSIS — E119 Type 2 diabetes mellitus without complications: Secondary | ICD-10-CM | POA: Diagnosis not present

## 2017-11-27 DIAGNOSIS — G8929 Other chronic pain: Secondary | ICD-10-CM

## 2017-11-27 DIAGNOSIS — M1710 Unilateral primary osteoarthritis, unspecified knee: Secondary | ICD-10-CM | POA: Diagnosis not present

## 2017-11-27 DIAGNOSIS — M5136 Other intervertebral disc degeneration, lumbar region: Secondary | ICD-10-CM

## 2017-11-27 DIAGNOSIS — F331 Major depressive disorder, recurrent, moderate: Secondary | ICD-10-CM | POA: Insufficient documentation

## 2017-11-27 DIAGNOSIS — M792 Neuralgia and neuritis, unspecified: Secondary | ICD-10-CM | POA: Insufficient documentation

## 2017-11-27 DIAGNOSIS — Z760 Encounter for issue of repeat prescription: Secondary | ICD-10-CM | POA: Insufficient documentation

## 2017-11-27 MED ORDER — HYDROCODONE-ACETAMINOPHEN 10-325 MG PO TABS: 1.0000 | ORAL_TABLET | Freq: Three times a day (TID) | ORAL | 0 refills | Status: AC | PRN

## 2017-11-27 MED ORDER — HYDROCODONE-ACETAMINOPHEN 10-325 MG PO TABS: 1.0000 | ORAL_TABLET | Freq: Three times a day (TID) | ORAL | 0 refills | Status: DC | PRN

## 2017-11-27 NOTE — Patient Instructions (Signed)
Hydrocodone x 3 sent to pharmacy. Patient notified to pick up

## 2017-11-27 NOTE — Progress Notes (Signed)
Patient's Name: Evan Rivas  MRN: [id number]  Referring Provider: Hubbard Hartshorn, FNP  DOB: 05/14/1950  PCP: Hubbard Hartshorn, FNP  DOS: 11/27/2017  Note by: Gillis Santa, MD  Service setting: Ambulatory outpatient  Specialty: Interventional Pain Management  Location: [facility] (AMB) Pain Management Facility    Patient type: Established   Primary Reason(s) for Visit: Encounter for prescription drug management. (Level of risk: moderate)  CC: Back Pain (low); Hip Pain (bilateral); and Knee Pain (bilateral)  HPI  Evan Rivas is a 67 y.o. year old, male patient, who comes today for a medication management evaluation. He has Allergic rhinitis; Anxiety; Continuous opioid dependence ([place]); DDD (degenerative disc disease), lumbar; Gout of foot; Essential hypertension; Flu vaccine need; Adiposity; OA (osteoarthritis) of knee; Neuralgia neuritis, sciatic nerve; Arthralgia of multiple joints; H/O malignant neoplasm of skin; Hyperlipidemia; Major depressive disorder, recurrent episode, moderate ([place]); Chronic low back pain; Arthralgia of both knees; Type 2 diabetes mellitus without complication ([place]); Depression; BPH without urinary obstruction; and Pneumonia on their problem list. His primarily concern today is the Back Pain (low); Hip Pain (bilateral); and Knee Pain (bilateral)  Pain Assessment: Location: Lower Back Radiating: back pain does not radiate  Onset: More than a month ago Duration: Chronic pain Quality: Aching, Sharp, Constant Severity: 3 /10 (subjective, self-reported pain score)  Note: Reported level is compatible with observation.                         When using our objective Pain Scale, levels between 6 and 10/10 are said to belong in an emergency room, as it progressively worsens from a 6/10, described as severely limiting, requiring emergency care not usually available at an outpatient pain management facility. At a 6/10 level, communication becomes difficult and requires great effort.  Assistance to reach the emergency department may be required. Facial flushing and profuse sweating along with potentially dangerous increases in heart rate and blood pressure will be evident. Effect on ADL: "I cant stand for long" Timing: Constant Modifying factors: medications BP: (!) 178/83  HR: 67  Evan Rivas was last scheduled for an appointment on 08/30/2017 for medication management. During today's appointment we reviewed Evan Rivas's chronic pain status, as well as his outpatient medication regimen.  Doing well overall, pain at baseline. Going to gym now. Here for med refill.  The patient  reports that he does not use drugs. His body mass index is 35.6 kg/m.  Further details on both, my assessment(s), as well as the proposed treatment plan, please see below.  Controlled Substance Pharmacotherapy Assessment REMS (Risk Evaluation and Mitigation Strategy)  Analgesic: Hydrocodone 10 mg 3 times daily as needed, quantity 90 MME/day: 30 mg/day.  Evan Shorter, RN  11/27/2017 10:04 AM  Signed Nursing Pain Medication Assessment:  Safety precautions to be maintained throughout the outpatient stay will include: orient to surroundings, keep bed in low position, maintain call bell within reach at all times, provide assistance with transfer out of bed and ambulation.  Medication Inspection Compliance: Pill count conducted under aseptic conditions, in front of the patient. Neither the pills nor the bottle was removed from the patient's sight at any time. Once count was completed pills were immediately returned to the patient in their original bottle.  Medication: Hydrocodone/APAP Pill/Patch Count: 54 of 90 pills remain Pill/Patch Appearance: Markings consistent with prescribed medication Bottle Appearance: Standard pharmacy container. Clearly labeled. Filled Date: 08/ 14/ 2019 Last Medication intake:  Evan Rivas  Pharmacokinetics: Liberation and absorption (onset of action):  WNL Distribution (time to peak effect): WNL Metabolism and excretion (duration of action): WNL         Pharmacodynamics: Desired effects: Analgesia: Evan Rivas reports >50% benefit. Functional ability: Patient reports that medication allows him to accomplish basic ADLs Clinically meaningful improvement in function (CMIF): Sustained CMIF goals met Perceived effectiveness: Described as relatively effective, allowing for increase in activities of daily living (ADL) Undesirable effects: Side-effects or Adverse reactions: None reported Monitoring: [place] PMP: Online review of the past 59-monthperiod conducted. Compliant with practice rules and regulations Last UDS on record: Summary  Date Value Ref Range Status  05/07/2017 FINAL  Final    Comment:    ==================================================================== TOXASSURE COMP DRUG ANALYSIS,UR ==================================================================== Test                             Result       Flag       Units Drug Present and Declared for Prescription Verification   Hydrocodone                    396          EXPECTED   ng/mg creat   Hydromorphone                  78           EXPECTED   ng/mg creat   Dihydrocodeine                 213          EXPECTED   ng/mg creat   Norhydrocodone                 814          EXPECTED   ng/mg creat    Sources of hydrocodone include scheduled prescription    medications. Hydromorphone, dihydrocodeine and norhydrocodone are    expected metabolites of hydrocodone. Hydromorphone and    dihydrocodeine are also available as scheduled prescription    medications.   Fluoxetine                     PRESENT      EXPECTED   Norfluoxetine                  PRESENT      EXPECTED    Norfluoxetine is an expected metabolite of fluoxetine.   Acetaminophen                  PRESENT      EXPECTED   Metoprolol                     PRESENT      EXPECTED Drug Absent but Declared for Prescription  Verification   Gabapentin                     Not Detected UNEXPECTED   Tizanidine                     Not Detected UNEXPECTED    Tizanidine, as indicated in the declared medication list, is not    always detected even when used as directed.   Methocarbamol                  Not Detected UNEXPECTED ==================================================================== Test  Result    Flag   Units      Ref Range   Creatinine              129              mg/dL      >=20 ==================================================================== Declared Medications:  The flagging and interpretation on this report are based on the  following declared medications.  Unexpected results may arise from  inaccuracies in the declared medications.  **Note: The testing scope of this panel includes these medications:  Fluoxetine (Prozac)  Gabapentin (Neurontin)  Hydrocodone (Norco)  Methocarbamol (Robaxin)  Metoprolol (Toprol)  **Note: The testing scope of this panel does not include small to  moderate amounts of these reported medications:  Acetaminophen (Norco)  Tizanidine (Zanaflex)  **Note: The testing scope of this panel does not include following  reported medications:  Allopurinol (Zyloprim)  Amlodipine (Norvasc)  Atorvastatin (Lipitor)  Fluticasone (Flonase)  Glipizide (Glucotrol)  Hydrochlorothiazide (Hydrodiuril)  Losartan (Cozaar)  Metformin  Nabumetone (Relafen)  Rivaroxaban (Xarelto)  Tamsulosin (Flomax) ==================================================================== For clinical consultation, please call [phone]. ====================================================================    UDS interpretation: Compliant          Medication Assessment Form: Reviewed. Patient indicates being compliant with therapy Treatment compliance: Compliant Risk Assessment Profile: Aberrant behavior: See prior evaluations. None observed or detected today Comorbid  factors increasing risk of overdose: See prior notes. No additional risks detected today Opioid risk tool (ORT) (Total Score): 1 Personal History of Substance Abuse (SUD-Substance use disorder):  Alcohol: Negative  Illegal Drugs: Negative  Rx Drugs: Negative  ORT Risk Level calculation: Low Risk Risk of substance use disorder (SUD): Low Opioid Risk Tool - 11/27/17 1002      Family History of Substance Abuse   Alcohol  Negative    Illegal Drugs  Negative    Rx Drugs  Negative      Personal History of Substance Abuse   Alcohol  Negative    Illegal Drugs  Negative    Rx Drugs  Negative      Age   Age between 51-45 years   No      History of Preadolescent Sexual Abuse   History of Preadolescent Sexual Abuse  Negative or Male      Psychological Disease   Psychological Disease  Negative    Depression  Positive      Total Score   Opioid Risk Tool Scoring  1    Opioid Risk Interpretation  Low Risk      ORT Scoring interpretation table:  Score <3 = Low Risk for SUD  Score between 4-7 = Moderate Risk for SUD  Score >8 = High Risk for Opioid Abuse   Risk Mitigation Strategies:  Patient Counseling: Covered Patient-Prescriber Agreement (PPA): Present and active  Notification to other healthcare providers: Done  Pharmacologic Plan: No change in therapy, at this time.             Laboratory Chemistry  Inflammation Markers (CRP: Acute Phase) (ESR: Chronic Phase) Lab Results  Component Value Date   LATICACIDVEN 1.5 07/19/2016                         Rheumatology Markers Lab Results  Component Value Date   LABURIC 4.9 09/17/2017                        Renal Function Markers Lab Results  Component Value Date  BUN 24 09/17/2017   CREATININE 1.09 09/17/2017   BCR 22 09/17/2017   GFRAA 81 09/17/2017   GFRNONAA 70 09/17/2017                             Hepatic Function Markers Lab Results  Component Value Date   AST 13 09/17/2017   ALT 16 09/17/2017   ALBUMIN 4.0  09/17/2017   ALKPHOS 84 09/17/2017                        Electrolytes Lab Results  Component Value Date   NA 146 (H) 09/17/2017   K 3.2 (L) 09/17/2017   CL 100 09/17/2017   CALCIUM 9.3 09/17/2017   MG 1.3 (L) 07/19/2016                        Neuropathy Markers Lab Results  Component Value Date   HGBA1C 7.4 (H) 09/17/2017                        Bone Pathology Markers No results found for: VD25OH, DP824MP5TIR, WE3154MG8, QP6195KD3, 25OHVITD1, 25OHVITD2, 25OHVITD3, TESTOFREE, TESTOSTERONE                       Coagulation Parameters Lab Results  Component Value Date   INR 0.97 10/18/2016   LABPROT 12.9 10/18/2016   APTT 28 10/18/2016   PLT 216 04/24/2017                        Cardiovascular Markers Lab Results  Component Value Date   BNP 65.0 04/24/2017   HGB 14.4 04/24/2017   HCT 42.7 04/24/2017                         CA Markers No results found for: CEA, CA125, LABCA2                      Note: Lab results reviewed.  Recent Diagnostic Imaging Results  DG Si Joints CLINICAL DATA:  Chronic low back pain without sciatica.  EXAM: BILATERAL SACROILIAC JOINTS - 3+ VIEW  COMPARISON:  CT abdomen and pelvis 07/24/2014.  FINDINGS: Mild degenerative changes are noted along the inferior SI joints bilaterally. Upper joints are within normal limits bilaterally. The pelvis is otherwise intact.  IMPRESSION: Mild degenerative changes of the SI joints bilaterally.  Electronically Signed   By: San Morelle M.D.   On: 05/07/2017 16:25 DG Lumbar Spine Complete W/Bend CLINICAL DATA:  Chronic low back pain without injury.  EXAM: LUMBAR SPINE - COMPLETE WITH BENDING VIEWS  COMPARISON:  CT of the abdomen and pelvis 07/24/2014  FINDINGS: 5 non rib-bearing lumbar type vertebral bodies are present.  Facet degenerative changes are most prominent at L4-5 bilaterally. Are mild facet degenerative changes are present at L2-3 and L3-4.  The standing neutral  image demonstrates 4 mm retrolisthesis at L2-3 and 3 mm retrolisthesis at L3-4. 4 mm anterolisthesis is present at L4-5.  There is no significant change in alignment through a limited range of flexion or extension. Alignment at L5-S1 is anatomic.  No spondylolysis is evident.  IMPRESSION: 1. Grade 1 retrolisthesis at L2-3 and L3-4 as described. 2. Grade 1 anterolisthesis at L4-5. 3. No significant change in alignment through a limited range of flexion and  extension. 4. Facet degenerative changes are most significant at L4-5 bilaterally.  Electronically Signed   By: San Morelle M.D.   On: 05/07/2017 16:24  Complexity Note: Imaging results reviewed. Results shared with Mr. Fulcher, using [employer].                         Meds   Current Outpatient Medications:  .  allopurinol (ZYLOPRIM) 300 MG tablet, Take 1 tablet (300 mg total) by mouth every evening., Disp: 90 tablet, Rfl: 1 .  amLODipine (NORVASC) 10 MG tablet, Take 1 tablet (10 mg total) by mouth daily., Disp: 90 tablet, Rfl: 0 .  ASPIRIN 81 PO, Take by mouth., Disp: , Rfl:  .  atorvastatin (LIPITOR) 40 MG tablet, Take 1 tablet (40 mg total) by mouth at bedtime., Disp: 90 tablet, Rfl: 1 .  FLUoxetine (PROZAC) 40 MG capsule, Take 1 capsule (40 mg total) by mouth daily., Disp: 90 capsule, Rfl: 1 .  fluticasone (FLONASE) 50 MCG/ACT nasal spray, Place 2 sprays into both nostrils daily. 2 sprays, Disp: 16 g, Rfl: 1 .  gabapentin (NEURONTIN) 300 MG capsule, Take 1 capsule (300 mg total) by mouth 3 (three) times daily., Disp: 90 capsule, Rfl: 4 .  glipiZIDE (GLUCOTROL) 5 MG tablet, Take 1 tablet (5 mg total) by mouth 2 (two) times daily before a meal., Disp: 180 tablet, Rfl: 1 .  glucose blood (ACCU-CHEK AVIVA PLUS) test strip, Use as instructed, Disp: 100 each, Rfl: 12 .  hydrochlorothiazide (HYDRODIURIL) 25 MG tablet, Take 1 tablet (25 mg total) by mouth daily., Disp: 90 tablet, Rfl: 0 .  [START ON 12/14/2017]  HYDROcodone-acetaminophen (NORCO) 10-325 MG tablet, Take 1 tablet by mouth 3 (three) times daily as needed., Disp: 90 tablet, Rfl: 0 .  hydrOXYzine (ATARAX/VISTARIL) 10 MG tablet, Take 1 tablet (10 mg total) by mouth every 8 (eight) hours as needed for anxiety., Disp: 20 tablet, Rfl: 0 .  losartan (COZAAR) 100 MG tablet, Take 1 tablet (100 mg total) by mouth at bedtime., Disp: 90 tablet, Rfl: 1 .  metFORMIN (GLUCOPHAGE-XR) 500 MG 24 hr tablet, Take 2 tablets (1,000 mg total) by mouth 2 (two) times daily. 1064m BID, Disp: 360 tablet, Rfl: 0 .  methocarbamol (ROBAXIN) 500 MG tablet, Take 1 tablet (500 mg total) by mouth every 6 (six) hours as needed for muscle spasms., Disp: 80 tablet, Rfl: 0 .  metoprolol succinate (TOPROL-XL) 100 MG 24 hr tablet, Take 1 tablet (100 mg total) by mouth every morning., Disp: 90 tablet, Rfl: 0 .  metoprolol succinate (TOPROL-XL) 25 MG 24 hr tablet, Take 1 tablet (25 mg total) by mouth every morning., Disp: 90 tablet, Rfl: 0 .  nabumetone (RELAFEN) 500 MG tablet, Take 1 tablet (500 mg total) by mouth 2 (two) times daily., Disp: 180 tablet, Rfl: 0 .  tamsulosin (FLOMAX) 0.4 MG CAPS capsule, Take 1 capsule (0.4 mg total) by mouth daily after supper., Disp: 90 capsule, Rfl: 0 .  tiZANidine (ZANAFLEX) 4 MG tablet, Take 1 tablet (4 mg total) by mouth at bedtime., Disp: 90 tablet, Rfl: 0 .  [START ON 01/12/2018] HYDROcodone-acetaminophen (NORCO) 10-325 MG tablet, Take 1 tablet by mouth 3 (three) times daily as needed for severe pain., Disp: 90 tablet, Rfl: 0 .  [START ON 02/11/2018] HYDROcodone-acetaminophen (NORCO) 10-325 MG tablet, Take 1 tablet by mouth 3 (three) times daily as needed for severe pain., Disp: 90 tablet, Rfl: 0  ROS  Constitutional: Denies any fever or  chills Gastrointestinal: No reported hemesis, hematochezia, vomiting, or acute GI distress Musculoskeletal: Denies any acute onset joint swelling, redness, loss of ROM, or weakness Neurological: No reported  episodes of acute onset apraxia, aphasia, dysarthria, agnosia, amnesia, paralysis, loss of coordination, or loss of consciousness  Allergies  Mr. Rhett is allergic to celebrex  [celecoxib]; tetanus toxoids; and tetanus-diphtheria toxoids td.  [place]  Drug: Mr. Gatley  reports that he does not use drugs. Alcohol:  reports that he does not drink alcohol. Tobacco:  reports that he has never smoked. He has never used smokeless tobacco. Medical:  has a past medical history of Anxiety, Arthritis, Cancer ([place]), Depression, Diabetes mellitus without complication ([place]), GERD (gastroesophageal reflux disease), Gout, History of kidney stones, Hyperlipidemia, Hypertension, and Pneumonia. Surgical: Mr. Talarico  has a past surgical history that includes Hernia repair; Arthroscopic repair ACL (Left); Mohs surgery; Cystoscopy/ureteroscopy/holmium laser/stent placement (Right, 08/17/2014); Ureteroscopy with holmium laser lithotripsy (Right, 09/07/2014); Cystoscopy w/ ureteral stent placement (Right, 09/07/2014); Vasectomy; Total knee arthroplasty (Left, 04/24/2016); and Total knee arthroplasty (Right, 10/23/2016). Family: family history includes Cancer in his mother; Depression in his sister; Hypertension in his mother; Stroke in his father.  Constitutional Exam  General appearance: Well nourished, well developed, and well hydrated. In no apparent acute distress Vitals:   11/27/17 0956 11/27/17 0957  BP:  (!) 178/83  Pulse:  67  Resp:  18  Temp:  98.7 F (37.1 C)  TempSrc:  Oral  SpO2:  99%  Weight: 281 lb (127.5 kg) 281 lb (127.5 kg)  Height: '6\' 2"'  (1.88 m) 6' 2.5" (1.892 m)   BMI Assessment: Estimated body mass index is 35.6 kg/m as calculated from the following:   Height as of this encounter: 6' 2.5" (1.892 m).   Weight as of this encounter: 281 lb (127.5 kg).  BMI interpretation table: BMI level Category Range association with higher incidence of chronic pain  <18 kg/m2 Underweight   18.5-24.9  kg/m2 Ideal body weight   25-29.9 kg/m2 Overweight Increased incidence by 20%  30-34.9 kg/m2 Obese (Class I) Increased incidence by 68%  35-39.9 kg/m2 Severe obesity (Class II) Increased incidence by 136%  >40 kg/m2 Extreme obesity (Class III) Increased incidence by 254%   Patient's current BMI Ideal Body weight  Body mass index is 35.6 kg/m. Ideal body weight: 83.4 kg (183 lb 12.1 oz) Adjusted ideal body weight: 101 kg (222 lb 10.4 oz)   BMI Readings from Last 4 Encounters:  11/27/17 35.60 kg/m  09/17/17 36.00 kg/m  08/30/17 35.95 kg/m  07/11/17 34.25 kg/m   Wt Readings from Last 4 Encounters:  11/27/17 281 lb (127.5 kg)  09/17/17 280 lb 6.4 oz (127.2 kg)  08/30/17 280 lb (127 kg)  07/11/17 274 lb (124.3 kg)  Psych/Mental status: Alert, oriented x 3 (person, place, & time)       Eyes: PERLA Respiratory: No evidence of acute respiratory distress  Cervical Spine Area Exam  Skin & Axial Inspection: No masses, redness, edema, swelling, or associated skin lesions Alignment: Symmetrical Functional ROM: Unrestricted ROM      Stability: No instability detected Muscle Tone/Strength: Functionally intact. No obvious neuro-muscular anomalies detected. Sensory (Neurological): Unimpaired Palpation: No palpable anomalies              Upper Extremity (UE) Exam    Side: Right upper extremity  Side: Left upper extremity  Skin & Extremity Inspection: Skin color, temperature, and hair growth are WNL. No peripheral edema or cyanosis. No masses, redness, swelling, asymmetry, or  associated skin lesions. No contractures.  Skin & Extremity Inspection: Skin color, temperature, and hair growth are WNL. No peripheral edema or cyanosis. No masses, redness, swelling, asymmetry, or associated skin lesions. No contractures.  Functional ROM: Unrestricted ROM          Functional ROM: Unrestricted ROM          Muscle Tone/Strength: Functionally intact. No obvious neuro-muscular anomalies detected.  Muscle  Tone/Strength: Functionally intact. No obvious neuro-muscular anomalies detected.  Sensory (Neurological): Unimpaired          Sensory (Neurological): Unimpaired          Palpation: No palpable anomalies              Palpation: No palpable anomalies              Provocative Test(s):  Phalen's test: deferred Tinel's test: deferred Apley's scratch test (touch opposite shoulder):  Action 1 (Across chest): deferred Action 2 (Overhead): deferred Action 3 (LB reach): deferred   Provocative Test(s):  Phalen's test: deferred Tinel's test: deferred Apley's scratch test (touch opposite shoulder):  Action 1 (Across chest): deferred Action 2 (Overhead): deferred Action 3 (LB reach): deferred    Thoracic Spine Area Exam  Skin & Axial Inspection: No masses, redness, or swelling Alignment: Symmetrical Functional ROM: Unrestricted ROM Stability: No instability detected Muscle Tone/Strength: Functionally intact. No obvious neuro-muscular anomalies detected. Sensory (Neurological): Unimpaired Muscle strength & Tone: No palpable anomalies Lumbar Spine Area Exam  Skin & Axial Inspection: No masses, redness, or swelling Alignment: Symmetrical Functional ROM: Unrestricted ROM       Stability: No instability detected Muscle Tone/Strength: Functionally intact. No obvious neuro-muscular anomalies detected. Sensory (Neurological): Unimpaired Palpation: No palpable anomalies       Provocative Tests: Lumbar Hyperextension/rotation test: Positive bilaterally for facet joint pain. Lumbar quadrant test (Kemp's test): deferred today       Lumbar Lateral bending test: (+) due to pain. Patrick's Maneuver: (+) for bilateral S-I arthralgia             FABER test: deferred today       Thigh-thrust test: deferred today       S-I compression test: deferred today       S-I distraction test: deferred today        Gait & Posture Assessment  Ambulation: Unassisted Gait: Relatively normal for age and body  habitus Posture: WNL   Lower Extremity Exam    Side: Right lower extremity  Side: Left lower extremity  Stability: No instability observed          Stability: No instability observed          Skin & Extremity Inspection: Evidence of prior arthroplastic surgery  Skin & Extremity Inspection: Evidence of prior arthroplastic surgery  Functional ROM: Decreased ROM for knee joint          Functional ROM: Decreased ROM for knee joint          Muscle Tone/Strength: Functionally intact. No obvious neuro-muscular anomalies detected.  Muscle Tone/Strength: Functionally intact. No obvious neuro-muscular anomalies detected.  Sensory (Neurological): Arthropathic arthralgia  Sensory (Neurological): Arthropathic arthralgia  Palpation: No palpable anomalies  Palpation: No palpable anomalies    Assessment  Primary Diagnosis & Pertinent Problem List: The primary encounter diagnosis was DDD (degenerative disc disease), lumbar. Diagnoses of Primary osteoarthritis of both knees, Lumbar spondylosis, History of bilateral knee replacement, Chronic bilateral low back pain without sciatica, Chronic pain syndrome, Arthralgia of both knees, and Pain of both  hip joints were also pertinent to this visit.  Status Diagnosis  Controlled Controlled Controlled 1. DDD (degenerative disc disease), lumbar   2. Primary osteoarthritis of both knees   3. Lumbar spondylosis   4. History of bilateral knee replacement   5. Chronic bilateral low back pain without sciatica   6. Chronic pain syndrome   7. Arthralgia of both knees   8. Pain of both hip joints      General Recommendations: The pain condition that the patient suffers from is best treated with a multidisciplinary approach that involves an increase in physical activity to prevent de-conditioning and worsening of the pain cycle, as well as psychological counseling (formal and/or informal) to address the co-morbid psychological affects of pain. Treatment will  often involve judicious use of pain medications and interventional procedures to decrease the pain, allowing the patient to participate in the physical activity that will ultimately produce long-lasting pain reductions. The goal of the multidisciplinary approach is to return the patient to a higher level of overall function and to restore their ability to perform activities of daily living.  67 year old male who presents with a chief complaint of axial low back pain, right hip pain, bilateral knee pain. 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 We discussed treatment options for this which would include lumbar medial branch nerve block at L3, L4, L5 bilaterally and/or bilateral SI joint injection. Patient would like to hold off on these procedures for the time being.  Patient returns today for medication refill. Overall patient is doing well.  Starting to go back to gym. He states that his knee pain and his low back pain are at baseline.  Stable on his current medication regimen.  Plan: -Refill hydrocodone at current dose of 10 mg 3 times daily as needed.  For 3 months.  No further dose escalation beyond this  -Continue gabapentin 300 mg TID  Future considerations: Lumbar facet medial branch nerve blocks at L3, L4, L5, bilateral SI joint injections, bilateral genicular nerve block  Plan of Care  Pharmacotherapy (Medications Ordered): Meds ordered this encounter  Medications  . HYDROcodone-acetaminophen (NORCO) 10-325 MG tablet    Sig: Take 1 tablet by mouth 3 (three) times daily as needed.    Dispense:  90 tablet    Refill:  0  . HYDROcodone-acetaminophen (NORCO) 10-325 MG tablet    Sig: Take 1 tablet by mouth 3 (three) times daily as needed for severe pain.    Dispense:  90 tablet    Refill:  0    Do not place this medication, or any  other prescription from our practice, on "Automatic Refill". Patient may have prescription filled one day early if pharmacy is closed on scheduled refill date.  [name] HYDROcodone-acetaminophen (NORCO) 10-325 MG tablet    Sig: Take 1 tablet by mouth 3 (three) times daily as needed for severe pain.    Dispense:  90 tablet    Refill:  0    Do not place this medication, or any other prescription from our practice, on "Automatic Refill". Patient may have prescription filled one day early if pharmacy is closed on scheduled refill date.   Time Note: Greater than 50% of the 25 minute(s) of face-to-face time spent with Mr. Woessner, was spent in counseling/coordination of care regarding: Mr. Enfield primary cause of pain, medication side effects, the opioid analgesic risks  and possible complications, the appropriate use of his medications, realistic expectations, the medication agreement and the patient's responsibilities when it comes to controlled substances.  Provider-requested follow-up: Return in about 3 months (around 02/27/2018) for Medication Management.  Future Appointments  Date Time Provider [place]  12/18/2017  8:00 AM Hubbard Hartshorn, FNP [facility]  02/26/2018  9:45 AM Gillis Santa, MD [facility] None    Primary Care Physician: Hubbard Hartshorn, FNP Location: [facility] Note by: Gillis Santa, M.D Date: 11/27/2017; Time: 1:15 PM  Patient Instructions  Hydrocodone x 3 sent to pharmacy. Patient notified to pick up

## 2017-11-27 NOTE — Progress Notes (Signed)
Nursing Pain Medication Assessment:  Safety precautions to be maintained throughout the outpatient stay will include: orient to surroundings, keep bed in low position, maintain call bell within reach at all times, provide assistance with transfer out of bed and ambulation.  Medication Inspection Compliance: Pill count conducted under aseptic conditions, in front of the patient. Neither the pills nor the bottle was removed from the patient's sight at any time. Once count was completed pills were immediately returned to the patient in their original bottle.  Medication: Hydrocodone/APAP Pill/Patch Count: 54 of 90 pills remain Pill/Patch Appearance: Markings consistent with prescribed medication Bottle Appearance: Standard pharmacy container. Clearly labeled. Filled Date: 08/ 14/ 2019 Last Medication intake:  Evan Rivas

## 2017-11-29 ENCOUNTER — Encounter: Payer: Medicare PPO | Admitting: Student in an Organized Health Care Education/Training Program

## 2017-12-18 ENCOUNTER — Telehealth: Payer: Self-pay | Admitting: Family Medicine

## 2017-12-18 ENCOUNTER — Encounter: Payer: Self-pay | Admitting: Family Medicine

## 2017-12-18 ENCOUNTER — Ambulatory Visit: Payer: Medicare PPO | Admitting: Family Medicine

## 2017-12-18 VITALS — BP 138/84 | HR 85 | Temp 98.7°F | Resp 18 | Ht 75.0 in | Wt 281.9 lb

## 2017-12-18 DIAGNOSIS — F331 Major depressive disorder, recurrent, moderate: Secondary | ICD-10-CM

## 2017-12-18 DIAGNOSIS — M545 Low back pain, unspecified: Secondary | ICD-10-CM

## 2017-12-18 DIAGNOSIS — F419 Anxiety disorder, unspecified: Secondary | ICD-10-CM

## 2017-12-18 DIAGNOSIS — Z23 Encounter for immunization: Secondary | ICD-10-CM

## 2017-12-18 DIAGNOSIS — M2550 Pain in unspecified joint: Secondary | ICD-10-CM

## 2017-12-18 DIAGNOSIS — I10 Essential (primary) hypertension: Secondary | ICD-10-CM

## 2017-12-18 DIAGNOSIS — G8929 Other chronic pain: Secondary | ICD-10-CM

## 2017-12-18 DIAGNOSIS — E7800 Pure hypercholesterolemia, unspecified: Secondary | ICD-10-CM | POA: Diagnosis not present

## 2017-12-18 DIAGNOSIS — E119 Type 2 diabetes mellitus without complications: Secondary | ICD-10-CM

## 2017-12-18 DIAGNOSIS — N400 Enlarged prostate without lower urinary tract symptoms: Secondary | ICD-10-CM

## 2017-12-18 DIAGNOSIS — R600 Localized edema: Secondary | ICD-10-CM | POA: Insufficient documentation

## 2017-12-18 DIAGNOSIS — M170 Bilateral primary osteoarthritis of knee: Secondary | ICD-10-CM

## 2017-12-18 DIAGNOSIS — G4719 Other hypersomnia: Secondary | ICD-10-CM

## 2017-12-18 LAB — POCT GLYCOSYLATED HEMOGLOBIN (HGB A1C): HbA1c, POC (controlled diabetic range): 8 % — AB (ref 0.0–7.0)

## 2017-12-18 MED ORDER — GLIPIZIDE 5 MG PO TABS: 5.0000 mg | ORAL_TABLET | Freq: Every evening | ORAL | 1 refills | Status: DC

## 2017-12-18 MED ORDER — METOPROLOL SUCCINATE ER 100 MG PO TB24: 100.0000 mg | ORAL_TABLET | ORAL | 0 refills | Status: DC

## 2017-12-18 MED ORDER — HYDROXYZINE HCL 10 MG PO TABS: 10.0000 mg | ORAL_TABLET | Freq: Three times a day (TID) | ORAL | 0 refills | Status: DC | PRN

## 2017-12-18 MED ORDER — GLIPIZIDE 10 MG PO TABS: 10.0000 mg | ORAL_TABLET | Freq: Every day | ORAL | 1 refills | Status: DC

## 2017-12-18 MED ORDER — FLUOXETINE HCL 40 MG PO CAPS: 40.0000 mg | ORAL_CAPSULE | Freq: Every day | ORAL | 1 refills | Status: DC

## 2017-12-18 MED ORDER — NABUMETONE 500 MG PO TABS: 500.0000 mg | ORAL_TABLET | Freq: Two times a day (BID) | ORAL | 0 refills | Status: DC

## 2017-12-18 MED ORDER — METOPROLOL SUCCINATE ER 25 MG PO TB24: 25.0000 mg | ORAL_TABLET | ORAL | 0 refills | Status: DC

## 2017-12-18 MED ORDER — METFORMIN HCL ER 750 MG PO TB24: 1500.0000 mg | ORAL_TABLET | Freq: Every day | ORAL | 1 refills | Status: DC

## 2017-12-18 MED ORDER — HYDROCHLOROTHIAZIDE 25 MG PO TABS: 25.0000 mg | ORAL_TABLET | Freq: Every day | ORAL | 1 refills | Status: DC

## 2017-12-18 MED ORDER — TAMSULOSIN HCL 0.4 MG PO CAPS: 0.4000 mg | ORAL_CAPSULE | Freq: Every day | ORAL | 0 refills | Status: DC

## 2017-12-18 MED ORDER — ATORVASTATIN CALCIUM 40 MG PO TABS: 40.0000 mg | ORAL_TABLET | Freq: Every day | ORAL | 1 refills | Status: DC

## 2017-12-18 MED ORDER — AMLODIPINE BESYLATE 10 MG PO TABS: 10.0000 mg | ORAL_TABLET | Freq: Every day | ORAL | 0 refills | Status: DC

## 2017-12-18 NOTE — Telephone Encounter (Signed)
Sent Rx's to [facility] - please notify Evan Rivas. Thanks!

## 2017-12-18 NOTE — Telephone Encounter (Signed)
Copied from [place] [id number]. Topic: General - Other >> Dec 18, 2017  9:37 AM Judyann Munson wrote: Reason for CRM: patient was seen today and stated the pharmacy for his medication should go to  [place], [place] [phone] (Phone) [phone] (Fax)  Medication: metFORMIN (GLUCOPHAGE XR) 750 MG 24 hr tablet glipiZIDE (GLUCOTROL) 5 MG tablet   Please advise

## 2017-12-18 NOTE — Progress Notes (Signed)
Name: Evan Rivas   MRN: [id number]    DOB: 09/20/1950   Date:12/18/2017       Progress Note  Subjective  Chief Complaint  Chief Complaint  Patient presents with  . Diabetes  . Hypertension  . Follow-up    3 month recheck    HPI  Diabetes mellitus type 2 Checking sugars?  yes How often? Daily - fasting Range (low to high) over last two weeks:  Running in the 200's on average at home. Trying to limit white bread, white rice, white potatoes, sweets?  no  Trying to limit sweetened drinks like iced tea, soft drinks, sports drinks, fruit juices?  no - over the summer he did indulge in sweet tea and snowballs He was watching his granddaughter (9yo) all summer, slacked on his diet and exercise during that time.  Checking feet every day/night?  yes Last eye exam:  Had examination in August 2019 Denies: Polyuria, polydipsia, polyphagia, vision changes, or neuropathy. Most recent A1C: 8.0-% today Lab Results  Component Value Date   HGBA1C 8.0 (A) 12/18/2017    We will recheck today. Last CMP Results : is not due for repeat today    Component Value Date/Time   NA 146 (H) 09/17/2017 1014   K 3.2 (L) 09/17/2017 1014   CL 100 09/17/2017 1014   CO2 28 09/17/2017 1014   GLUCOSE 213 (H) 09/17/2017 1014   GLUCOSE 210 (H) 10/25/2016 0528   BUN 24 09/17/2017 1014   CREATININE 1.09 09/17/2017 1014   CALCIUM 9.3 09/17/2017 1014   PROT 6.6 09/17/2017 1014   ALBUMIN 4.0 09/17/2017 1014   AST 13 09/17/2017 1014   ALT 16 09/17/2017 1014   ALKPHOS 84 09/17/2017 1014   BILITOT 0.3 09/17/2017 1014   GFRNONAA 70 09/17/2017 1014   GFRAA 81 09/17/2017 1014   Urine Micro UTD? No Current Medication Management:  Diabetic Medications: 5mg  Glipizide BID, 1000mg  ER Metformin BID. We will make changes per orders today. ACEI/ARB: Yes Statin: Yes Aspirin therapy: Yes  Hyperlipidemia: Current Medication Regimen: Last Lipids: Lab Results  Component Value Date   CHOL 140 09/17/2017   HDL 32 (L) 09/17/2017   LDLCALC 80 09/17/2017   TRIG 141 09/17/2017   CHOLHDL 3.8 06/03/2015   - Current Diet: - Denies: Chest pain, shortness of breath, myalgias. - Documented aortic atherosclerosis? No - Risk factors for atherosclerosis: diabetes mellitus, hypercholesterolemia and hypertension  HTN:  -does take medications as prescribed - current regimen includes Losartan 100mg , Metoprolol 125mg  once daily, amlodipine 10mg  daily (discussed possible contribution to BLE edema, wants to remain on until seeing cardiology),  - taking medications as instructed, no medication side effects noted, no TIAs, no chest pain on exertion, no dyspnea on exertion, noting swelling of ankles - DASH diet discussed - pt does not follow a low sodium diet; was not following over the summer - The following  are contributing factors: stress, sedentary lifestyle, corticosteroid use, saturated fats in diet  Obesity: Body mass index is 35.24 kg/m. Weight Management History: Diet: not "overeating" but not portioning either Exercise: not active Co-Morbid Conditions: diabetes mellitus, dyslipidemias and hypertension; 2 or more of these conditions combined with BMI >30 is considered morbid obesity; is this diagnosis appropriate and/or added to patient's problem list? Yes  Chronic Pain and Opioid Dependence: He is seeing Dr. [name] for management. Taking gabapentin 300mg  TID and Norco, Relafen daily which is prescribed by our office - needs refill today - most recent kidney function is  reviewed and is WNL. Pain is mostly in low back and bilateral knees (is s/p bilateral TKR) - states pain has been more and more controlled with new medications. Able to function better recently, but low back pain still bothers him.  BLE Edema: He notes swelling for over 71mos now - swelling does go down in his feet at night, but his lower legs remain swollen now.  He was wearing compression stockings, but stopped over the summer and has  not noticed a big difference.Taking HCTZ 25mg .Denies chest pain or shortness of breath, no palpitations, no orthopnea, sleeps on 1 pillow. We discussed removing amlodipine from his daily HTN regimen, however he has been doing well with current interventions, so we will keep current regimen for the time being and monitor.  He would like to see a cardiologist because he is concerned for CHF. BNP was normal in January 2019.   Depression and Anxiety: He is back on prozac 40mg  and doing well, says his anxiety is almost back to normal, but he still has an increase in anxiety when he is in a store or around a lot of people and at night. Discussed possibility of this contributing to daytime fatigue, he would like to evaluate for OSA before changing medications for depression. We discussed possible sedative effects, and he verbalizes understanding.  Daytime Sleepiness:  Very exhausted throughout the day; going to sleep late, up watching TV, wife just retired and this has thrown off his routine.  He does snore, he is obese.  BPH: Nocturia has resolved; doing well on flomax, no concerns today.  Patient Active Problem List   Diagnosis Date Noted  . Bilateral lower extremity edema 12/18/2017  . BPH without urinary obstruction 06/03/2015  . Depression 03/05/2015  . Hyperlipidemia 09/03/2014  . Major depressive disorder, recurrent episode, moderate ([place]) 09/03/2014  . Chronic low back pain 09/03/2014  . Arthralgia of both knees 09/03/2014  . Type 2 diabetes mellitus without complication ([place]) 78/24/2353  . Allergic rhinitis 07/04/2014  . Anxiety 07/04/2014  . Continuous opioid dependence ([place]) 07/04/2014  . DDD (degenerative disc disease), lumbar 07/04/2014  . Gout of foot 07/04/2014  . Essential hypertension 07/04/2014  . Flu vaccine need 07/04/2014  . Adiposity 07/04/2014  . OA (osteoarthritis) of knee 07/04/2014  . Neuralgia neuritis, sciatic nerve 07/04/2014  . Arthralgia of multiple joints  07/04/2014  . H/O malignant neoplasm of skin 12/24/2013    Past Surgical History:  Procedure Laterality Date  . ARTHROSCOPIC REPAIR ACL Left   . CYSTOSCOPY W/ URETERAL STENT PLACEMENT Right 09/07/2014   Procedure: CYSTOSCOPY WITH STENT REPLACEMENT;  Surgeon: Hollice Espy, MD;  Location: [facility];  Service: Urology;  Laterality: Right;  . CYSTOSCOPY/URETEROSCOPY/HOLMIUM LASER/STENT PLACEMENT Right 08/17/2014   Procedure: CYSTOSCOPY/URETEROSCOPY//STENT PLACEMENT/ attempt of lithotripsy;  Surgeon: Hollice Espy, MD;  Location: [facility];  Service: Urology;  Laterality: Right;  . HERNIA REPAIR    . MOHS SURGERY     lip  . TOTAL KNEE ARTHROPLASTY Left 04/24/2016   Procedure: LEFT TOTAL KNEE ARTHROPLASTY;  Surgeon: Gaynelle Arabian, MD;  Location: [facility];  Service: Orthopedics;  Laterality: Left;  Adductor Block  . TOTAL KNEE ARTHROPLASTY Right 10/23/2016   Procedure: RIGHT TOTAL KNEE ARTHROPLASTY;  Surgeon: Gaynelle Arabian, MD;  Location: [facility];  Service: Orthopedics;  Laterality: Right;  . URETEROSCOPY WITH HOLMIUM LASER LITHOTRIPSY Right 09/07/2014   Procedure: URETEROSCOPY WITH HOLMIUM LASER LITHOTRIPSY;  Surgeon: Hollice Espy, MD;  Location: [facility];  Service: Urology;  Laterality: Right;  .  VASECTOMY      Family History  Problem Relation Age of Onset  . Hypertension Mother   . Cancer Mother   . Stroke Father   . Depression Sister     Social History   Socioeconomic History  . Marital status: Married    Spouse name: Hoyle Sauer  . Number of children: 1  . Years of education: Not on file  . Highest education level: Not on file  Occupational History  . Occupation: retired  Scientific laboratory technician  . Financial resource strain: Not hard at all  . Food insecurity:    Worry: Never true    Inability: Never true  . Transportation needs:    Medical: No    Non-medical: No  Tobacco Use  . Smoking status: Never Smoker  . Smokeless tobacco: Never Used  Substance and Sexual Activity  . Alcohol use:  No  . Drug use: No  . Sexual activity: Yes  Lifestyle  . Physical activity:    Days per week: 4 days    Minutes per session: 60 min  . Stress: Only a little  Relationships  . Social connections:    Talks on phone: More than three times a week    Gets together: More than three times a week    Attends religious service: 1 to 4 times per year    Active member of club or organization: Yes    Attends meetings of clubs or organizations: 1 to 4 times per year    Relationship status: Married  . Intimate partner violence:    Fear of current or ex partner: No    Emotionally abused: No    Physically abused: Not on file    Forced sexual activity: No  Other Topics Concern  . Not on file  Social History Narrative  . Not on file     Current Outpatient Medications:  .  allopurinol (ZYLOPRIM) 300 MG tablet, Take 1 tablet (300 mg total) by mouth every evening., Disp: 90 tablet, Rfl: 1 .  amLODipine (NORVASC) 10 MG tablet, Take 1 tablet (10 mg total) by mouth daily., Disp: 90 tablet, Rfl: 0 .  ASPIRIN 81 PO, Take by mouth., Disp: , Rfl:  .  atorvastatin (LIPITOR) 40 MG tablet, Take 1 tablet (40 mg total) by mouth at bedtime., Disp: 90 tablet, Rfl: 1 .  FLUoxetine (PROZAC) 40 MG capsule, Take 1 capsule (40 mg total) by mouth daily., Disp: 90 capsule, Rfl: 1 .  fluticasone (FLONASE) 50 MCG/ACT nasal spray, Place 2 sprays into both nostrils daily. 2 sprays, Disp: 16 g, Rfl: 1 .  gabapentin (NEURONTIN) 300 MG capsule, Take 1 capsule (300 mg total) by mouth 3 (three) times daily., Disp: 90 capsule, Rfl: 4 .  glucose blood (ACCU-CHEK AVIVA PLUS) test strip, Use as instructed, Disp: 100 each, Rfl: 12 .  hydrochlorothiazide (HYDRODIURIL) 25 MG tablet, Take 1 tablet (25 mg total) by mouth daily., Disp: 90 tablet, Rfl: 1 .  HYDROcodone-acetaminophen (NORCO) 10-325 MG tablet, Take 1 tablet by mouth 3 (three) times daily as needed., Disp: 90 tablet, Rfl: 0 .  [START ON 01/12/2018] HYDROcodone-acetaminophen  (NORCO) 10-325 MG tablet, Take 1 tablet by mouth 3 (three) times daily as needed for severe pain., Disp: 90 tablet, Rfl: 0 .  [START ON 02/11/2018] HYDROcodone-acetaminophen (NORCO) 10-325 MG tablet, Take 1 tablet by mouth 3 (three) times daily as needed for severe pain., Disp: 90 tablet, Rfl: 0 .  hydrOXYzine (ATARAX/VISTARIL) 10 MG tablet, Take 1 tablet (10 mg total)  by mouth every 8 (eight) hours as needed for anxiety., Disp: 20 tablet, Rfl: 0 .  losartan (COZAAR) 100 MG tablet, Take 1 tablet (100 mg total) by mouth at bedtime., Disp: 90 tablet, Rfl: 1 .  methocarbamol (ROBAXIN) 500 MG tablet, Take 1 tablet (500 mg total) by mouth every 6 (six) hours as needed for muscle spasms., Disp: 80 tablet, Rfl: 0 .  metoprolol succinate (TOPROL-XL) 100 MG 24 hr tablet, Take 1 tablet (100 mg total) by mouth every morning., Disp: 90 tablet, Rfl: 0 .  metoprolol succinate (TOPROL-XL) 25 MG 24 hr tablet, Take 1 tablet (25 mg total) by mouth every morning., Disp: 90 tablet, Rfl: 0 .  nabumetone (RELAFEN) 500 MG tablet, Take 1 tablet (500 mg total) by mouth 2 (two) times daily., Disp: 180 tablet, Rfl: 0 .  tamsulosin (FLOMAX) 0.4 MG CAPS capsule, Take 1 capsule (0.4 mg total) by mouth daily after supper., Disp: 90 capsule, Rfl: 0 .  tiZANidine (ZANAFLEX) 4 MG tablet, Take 1 tablet (4 mg total) by mouth at bedtime., Disp: 90 tablet, Rfl: 0 .  glipiZIDE (GLUCOTROL) 10 MG tablet, Take 1 tablet (10 mg total) by mouth daily before breakfast., Disp: 90 tablet, Rfl: 1 .  glipiZIDE (GLUCOTROL) 5 MG tablet, Take 1 tablet (5 mg total) by mouth every evening. With dinner, Disp: 90 tablet, Rfl: 1 .  metFORMIN (GLUCOPHAGE XR) 750 MG 24 hr tablet, Take 2 tablets (1,500 mg total) by mouth daily with breakfast., Disp: 180 tablet, Rfl: 1  Allergies  Allergen Reactions  . Celebrex  [Celecoxib]     GI  . Tetanus Toxoids Other (See Comments)    unkown  . Tetanus-Diphtheria Toxoids Td     Other reaction(s): Unknown   I  personally reviewed active problem list, medication list, allergies, health maintenance, notes from last encounter, lab results with the patient/caregiver today.  ROS Constitutional: Negative for fever or weight change.  Respiratory: Negative for cough and shortness of breath.   Cardiovascular: Negative for chest pain or palpitations.  Gastrointestinal: Negative for abdominal pain, no bowel changes.  Musculoskeletal: Negative for gait problem or joint swelling.  Skin: Negative for rash.  Neurological: Negative for dizziness or headache.  No other specific complaints in a complete review of systems (except as listed in HPI above).  Objective  Vitals:   12/18/17 0804  BP: 138/84  Pulse: 85  Resp: 18  Temp: 98.7 F (37.1 C)  TempSrc: Oral  SpO2: 96%  Weight: 281 lb 14.4 oz (127.9 kg)  Height: 6\' 3"  (1.905 m)    Body mass index is 35.24 kg/m.  Physical Exam Constitutional: Patient appears well-developed and well-nourished. No distress.  HENT: Head: Normocephalic and atraumatic. Nose: Nose normal. Mouth/Throat: Oropharynx is clear and moist. No oropharyngeal exudate or tonsillar swelling.  Eyes: Conjunctivae and EOM are normal. No scleral icterus.  Pupils are equal, round, and reactive to light.  Neck: Normal range of motion. Neck supple. No JVD present. No thyromegaly present.  Cardiovascular: Normal rate, regular rhythm and normal heart sounds.  No murmur heard. +3 BLE edema - notably worse than previous evaluations. Pulmonary/Chest: Effort normal and breath sounds normal. No respiratory distress. Abdominal: Soft. Bowel sounds are normal, no distension. There is no tenderness. No masses. Musculoskeletal: Normal range of motion, no joint effusions. No gross deformities Neurological: Pt is alert and oriented to person, place, and time. No cranial nerve deficit. Coordination, balance, strength, speech and gait are normal.  Skin: Skin is warm and dry.  No rash noted. No erythema.    Psychiatric: Patient has a normal mood and affect. behavior is normal. Judgment and thought content normal.  Results for orders placed or performed in visit on 12/18/17 (from the past 72 hour(s))  POCT glycosylated hemoglobin (Hb A1C)     Status: Abnormal   Collection Time: 12/18/17  8:57 AM  Result Value Ref Range   Hemoglobin A1C     HbA1c POC (<> result, manual entry)     HbA1c, POC (prediabetic range)     HbA1c, POC (controlled diabetic range) 8.0 (A) 0.0 - 7.0 %    PHQ2/9: Depression screen [facility] 2/9 12/18/2017 11/27/2017 09/17/2017 08/30/2017 07/11/2017  Decreased Interest 0 0 0 0 0  Down, Depressed, Hopeless 0 0 0 0 0  PHQ - 2 Score 0 0 0 0 0  Altered sleeping 1 - 0 - -  Tired, decreased energy 1 - 1 - -  Change in appetite 0 - 0 - -  Feeling bad or failure about yourself  0 - 0 - -  Trouble concentrating 0 - 0 - -  Moving slowly or fidgety/restless 0 - 0 - -  Suicidal thoughts 0 - 0 - -  PHQ-9 Score 2 - 1 - -  Difficult doing work/chores - - Not difficult at all - -   Fall Risk: Fall Risk  12/18/2017 11/27/2017 08/30/2017 07/11/2017 05/22/2017  Falls in the past year? No No No No No  Number falls in past yr: - - - - -    Assessment & Plan  1. Type 2 diabetes mellitus without complication, without long-term current use of insulin (HCC) - STOP Metformin 1000mg  BID; Discussed strict diet adherence.  Change glipizide to 10mg  QAM and 5mg  QPM - POCT glycosylated hemoglobin (Hb A1C) - glipiZIDE (GLUCOTROL) 5 MG tablet; Take 1 tablet (5 mg total) by mouth every evening. With dinner  Dispense: 90 tablet; Refill: 1 - Ambulatory referral to Cardiology - Urine Microalbumin w/creat. ratio  2. Major depressive disorder, recurrent episode, moderate (HCC) - PHQ-9 is stable at this time.  Advised this may be contributing to his fatigue; he does agree this may contribute, but would like to rule out other causes first (OSA, CHF, DM) - FLUoxetine (PROZAC) 40 MG capsule; Take 1 capsule (40 mg  total) by mouth daily.  Dispense: 90 capsule; Refill: 1  3. Pure hypercholesterolemia - Ambulatory referral to Cardiology - atorvastatin (LIPITOR) 40 MG tablet; Take 1 tablet (40 mg total) by mouth at bedtime.  Dispense: 90 tablet; Refill: 1  4. Chronic bilateral low back pain without sciatica - Keep follow up with Dr. [name]  5. Essential hypertension - Ambulatory referral to Cardiology - hydrochlorothiazide (HYDRODIURIL) 25 MG tablet; Take 1 tablet (25 mg total) by mouth daily.  Dispense: 90 tablet; Refill: 1 - amLODipine (NORVASC) 10 MG tablet; Take 1 tablet (10 mg total) by mouth daily.  Dispense: 90 tablet; Refill: 0 - metoprolol succinate (TOPROL-XL) 100 MG 24 hr tablet; Take 1 tablet (100 mg total) by mouth every morning.  Dispense: 90 tablet; Refill: 0 - metoprolol succinate (TOPROL-XL) 25 MG 24 hr tablet; Take 1 tablet (25 mg total) by mouth every morning.  Dispense: 90 tablet; Refill: 0  6. Bilateral lower extremity edema - Ambulatory referral to Cardiology - hydrochlorothiazide (HYDRODIURIL) 25 MG tablet; Take 1 tablet (25 mg total) by mouth daily.  Dispense: 90 tablet; Refill: 1  7. Excessive daytime sleepiness - Ambulatory referral to Sleep Studies  8. Morbidly obese (  [place]) - Discussed increased risk for adverse health outcomes. - Discussed importance of 150 minutes of physical activity weekly, eat two servings of fish weekly, eat one serving of tree nuts ( cashews, pistachios, pecans, almonds.[name]) every other day, eat 6 servings of fruit/vegetables daily and drink plenty of water and avoid sweet beverages.  9. Anxiety - FLUoxetine (PROZAC) 40 MG capsule; Take 1 capsule (40 mg total) by mouth daily.  Dispense: 90 capsule; Refill: 1 - hydrOXYzine (ATARAX/VISTARIL) 10 MG tablet; Take 1 tablet (10 mg total) by mouth every 8 (eight) hours as needed for anxiety.  Dispense: 20 tablet; Refill: 0  10. BPH without urinary obstruction - tamsulosin (FLOMAX) 0.4 MG CAPS capsule; Take 1  capsule (0.4 mg total) by mouth daily after supper.  Dispense: 90 capsule; Refill: 0  11. Arthralgia of multiple joints - nabumetone (RELAFEN) 500 MG tablet; Take 1 tablet (500 mg total) by mouth 2 (two) times daily.  Dispense: 180 tablet; Refill: 0  12. Primary osteoarthritis of both knees - Continue with Dr. [name]  13. Needs flu shot - Flu vaccine HIGH DOSE PF (Fluzone High dose)

## 2017-12-18 NOTE — Telephone Encounter (Signed)
Please request records from [facility] [place]

## 2017-12-19 LAB — MICROALBUMIN / CREATININE URINE RATIO
CREATININE, URINE: 80 mg/dL (ref 20–320)
MICROALB/CREAT RATIO: 69 ug/mg{creat} — AB (ref <30)
Microalb, Ur: 5.5 mg/dL

## 2017-12-21 ENCOUNTER — Telehealth: Payer: Self-pay | Admitting: Family Medicine

## 2017-12-21 DIAGNOSIS — I10 Essential (primary) hypertension: Secondary | ICD-10-CM

## 2017-12-21 DIAGNOSIS — F419 Anxiety disorder, unspecified: Secondary | ICD-10-CM

## 2017-12-21 DIAGNOSIS — E7800 Pure hypercholesterolemia, unspecified: Secondary | ICD-10-CM

## 2017-12-21 DIAGNOSIS — N400 Enlarged prostate without lower urinary tract symptoms: Secondary | ICD-10-CM

## 2017-12-21 DIAGNOSIS — E119 Type 2 diabetes mellitus without complications: Secondary | ICD-10-CM

## 2017-12-21 DIAGNOSIS — F331 Major depressive disorder, recurrent, moderate: Secondary | ICD-10-CM

## 2017-12-21 DIAGNOSIS — R600 Localized edema: Secondary | ICD-10-CM

## 2017-12-21 NOTE — Telephone Encounter (Signed)
Copied from [place] [id number]. Topic: Quick Communication - See Telephone Encounter >> Dec 21, 2017  4:05 PM Rutherford Nail, [place] wrote: CRM for notification. See Telephone encounter for: 12/21/17. Patient calling and states that all of the following medications need to be switched to [place], [facility] [address]  metoprolol succinate (TOPROL-XL) 100 MG 24 hr tablet metoprolol succinate (TOPROL-XL) 25 MG 24 hr tablet hydrOXYzine (ATARAX/VISTARIL) 10 MG tablet atorvastatin (LIPITOR) 40 MG tablet FLUoxetine (PROZAC) 40 MG capsule hydrochlorothiazide (HYDRODIURIL) 25 MG tablet tamsulosin (FLOMAX) 0.4 MG CAPS capsule glipiZIDE (GLUCOTROL) 10 MG tablet glipiZIDE (GLUCOTROL) 5 MG tablet metFORMIN (GLUCOPHAGE XR) 750 MG 24 hr tablet

## 2017-12-24 NOTE — Telephone Encounter (Signed)
Please send medication.

## 2017-12-24 NOTE — Telephone Encounter (Signed)
Please que up medication for me to sign.

## 2017-12-26 MED ORDER — METFORMIN HCL ER 750 MG PO TB24: 1500.0000 mg | ORAL_TABLET | Freq: Every day | ORAL | 1 refills | Status: DC

## 2017-12-26 MED ORDER — HYDROCHLOROTHIAZIDE 25 MG PO TABS: 25.0000 mg | ORAL_TABLET | Freq: Every day | ORAL | 1 refills | Status: DC

## 2017-12-26 MED ORDER — GLIPIZIDE 10 MG PO TABS: 10.0000 mg | ORAL_TABLET | Freq: Every day | ORAL | 1 refills | Status: DC

## 2017-12-26 MED ORDER — GLIPIZIDE 5 MG PO TABS: 5.0000 mg | ORAL_TABLET | Freq: Every evening | ORAL | 1 refills | Status: DC

## 2017-12-26 MED ORDER — ATORVASTATIN CALCIUM 40 MG PO TABS: 40.0000 mg | ORAL_TABLET | Freq: Every day | ORAL | 1 refills | Status: DC

## 2017-12-26 MED ORDER — TAMSULOSIN HCL 0.4 MG PO CAPS: 0.4000 mg | ORAL_CAPSULE | Freq: Every day | ORAL | 0 refills | Status: DC

## 2017-12-26 MED ORDER — METOPROLOL SUCCINATE ER 25 MG PO TB24: 25.0000 mg | ORAL_TABLET | ORAL | 0 refills | Status: DC

## 2017-12-26 MED ORDER — METOPROLOL SUCCINATE ER 100 MG PO TB24: 100.0000 mg | ORAL_TABLET | ORAL | 0 refills | Status: DC

## 2017-12-26 MED ORDER — FLUOXETINE HCL 40 MG PO CAPS: 40.0000 mg | ORAL_CAPSULE | Freq: Every day | ORAL | 1 refills | Status: DC

## 2018-02-08 ENCOUNTER — Other Ambulatory Visit: Payer: Self-pay | Admitting: Internal Medicine

## 2018-02-08 ENCOUNTER — Encounter: Payer: Self-pay | Admitting: Internal Medicine

## 2018-02-08 ENCOUNTER — Ambulatory Visit: Payer: Medicare PPO | Admitting: Internal Medicine

## 2018-02-08 VITALS — BP 170/80 | HR 72 | Ht 75.0 in | Wt 280.5 lb

## 2018-02-08 DIAGNOSIS — R600 Localized edema: Secondary | ICD-10-CM | POA: Diagnosis not present

## 2018-02-08 DIAGNOSIS — I10 Essential (primary) hypertension: Secondary | ICD-10-CM | POA: Diagnosis not present

## 2018-02-08 MED ORDER — SPIRONOLACTONE 25 MG PO TABS: 25.0000 mg | ORAL_TABLET | Freq: Every day | ORAL | 0 refills | Status: DC

## 2018-02-08 MED ORDER — AMLODIPINE BESYLATE 5 MG PO TABS: 5.0000 mg | ORAL_TABLET | Freq: Every day | ORAL | Status: DC

## 2018-02-08 NOTE — Progress Notes (Signed)
New Outpatient Visit Date: 02/08/2018  Referring Provider: Hubbard Hartshorn, [place] [place], [place] [address]  Chief Complaint: Leg swelling  HPI:  Mr. Hands is a 67 y.o. male who is being seen today for the evaluation of lower extremity edema at the request of Ms. Boyce. He has a history of hypertension, hyperlipidemia, type 2 diabetes mellitus, cancer of the lip, anxiety, depression, gout, and arthritis with chronic pain.  Mr. Vold reports bilateral foot and ankle swelling over the last year.  He does not know of any medication changes or other precipitants around the time that the swelling began.  His weight has been stable.  He denies prior DVT.  He is frequently fatigued, particularly during the afternoon.  He often naps heavily in the mid and late afternoon.  He notes that his leg swelling improves with elevation and compression stocking use.  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  Mr. Lucarelli denies a history of cardiac disease.  He denies chest pain, orthopnea, PND, palpitations, and lightheadedness.  He gets short of breath when he bends over to tie his shoes but not otherwise,  He is frustrated by inability to lose weight despite exercising regularly.  --------------------------------------------------------------------------------------------------  Cardiovascular History & Procedures: Cardiovascular Problems:  Lower extremity edema  Risk Factors:  Hypertension, hyperlipidemia, diabetes mellitus, male gender, obesity, and age > 14  Cath/PCI:  None  CV Surgery:  None  EP Procedures and Devices:  None  Non-Invasive Evaluation(s):  None  Recent CV Pertinent Labs: Lab Results  Component Value Date   CHOL 140 09/17/2017   HDL 32 (L) 09/17/2017   LDLCALC 80 09/17/2017   TRIG 141 09/17/2017   CHOLHDL 3.8 06/03/2015   INR 0.97 10/18/2016   BNP 65.0 04/24/2017   K 3.2 (L) 09/17/2017   MG 1.3 (L)  07/19/2016   BUN 24 09/17/2017   CREATININE 1.09 09/17/2017    --------------------------------------------------------------------------------------------------  Past Medical History:  Diagnosis Date  . Anxiety    panic attacks  . Arthritis   . Cancer (HCC)    lip  . Depression   . Diabetes mellitus without complication ([facility])    type 2  . GERD (gastroesophageal reflux disease)   . Gout   . History of kidney stones   . Hyperlipidemia   . Hypertension   . Pneumonia    2014 and 2018 ( 2018-klebsiella pneumonia     Past Surgical History:  Procedure Laterality Date  . ARTHROSCOPIC REPAIR ACL Left   . CYSTOSCOPY W/ URETERAL STENT PLACEMENT Right 09/07/2014   Procedure: CYSTOSCOPY WITH STENT REPLACEMENT;  Surgeon: Hollice Espy, MD;  Location: [facility];  Service: Urology;  Laterality: Right;  . CYSTOSCOPY/URETEROSCOPY/HOLMIUM LASER/STENT PLACEMENT Right 08/17/2014   Procedure: CYSTOSCOPY/URETEROSCOPY//STENT PLACEMENT/ attempt of lithotripsy;  Surgeon: Hollice Espy, MD;  Location: [facility];  Service: Urology;  Laterality: Right;  . HERNIA REPAIR    . MOHS SURGERY     lip  . TOTAL KNEE ARTHROPLASTY Left 04/24/2016   Procedure: LEFT TOTAL KNEE ARTHROPLASTY;  Surgeon: Gaynelle Arabian, MD;  Location: [facility];  Service: Orthopedics;  Laterality: Left;  Adductor Block  . TOTAL KNEE ARTHROPLASTY Right 10/23/2016   Procedure: RIGHT TOTAL KNEE ARTHROPLASTY;  Surgeon: Gaynelle Arabian, MD;  Location: [facility];  Service: Orthopedics;  Laterality: Right;  . URETEROSCOPY WITH HOLMIUM LASER LITHOTRIPSY Right 09/07/2014   Procedure: URETEROSCOPY WITH HOLMIUM LASER LITHOTRIPSY;  Surgeon: Hollice Espy, MD;  Location: [facility];  Service: Urology;  Laterality: Right;  [name] VASECTOMY      Current Meds  Medication Sig  . allopurinol (ZYLOPRIM) 300 MG tablet Take 1 tablet (300 mg total) by mouth every evening.  [name] amLODipine (NORVASC) 5 MG tablet Take 1 tablet (5 mg total) by mouth daily.  . ASPIRIN 81 PO  Take 81 mg by mouth daily.   [name] atorvastatin (LIPITOR) 40 MG tablet Take 1 tablet (40 mg total) by mouth at bedtime.  [name] FLUoxetine (PROZAC) 40 MG capsule Take 1 capsule (40 mg total) by mouth daily.  . fluticasone (FLONASE) 50 MCG/ACT nasal spray Place 2 sprays into both nostrils daily. 2 sprays  . gabapentin (NEURONTIN) 300 MG capsule Take 1 capsule (300 mg total) by mouth 3 (three) times daily.  [name] glipiZIDE (GLUCOTROL) 10 MG tablet Take 1 tablet (10 mg total) by mouth daily before breakfast.  . glipiZIDE (GLUCOTROL) 5 MG tablet Take 1 tablet (5 mg total) by mouth every evening. With dinner  . glucose blood (ACCU-CHEK AVIVA PLUS) test strip Use as instructed  . hydrochlorothiazide (HYDRODIURIL) 25 MG tablet Take 1 tablet (25 mg total) by mouth daily.  [name] HYDROcodone-acetaminophen (NORCO) 10-325 MG tablet Take 1 tablet by mouth 3 (three) times daily as needed for severe pain.  . hydrOXYzine (ATARAX/VISTARIL) 10 MG tablet Take 1 tablet (10 mg total) by mouth every 8 (eight) hours as needed for anxiety.  [name] losartan (COZAAR) 100 MG tablet Take 1 tablet (100 mg total) by mouth at bedtime.  . metFORMIN (GLUCOPHAGE XR) 750 MG 24 hr tablet Take 2 tablets (1,500 mg total) by mouth daily with breakfast.  . methocarbamol (ROBAXIN) 500 MG tablet Take 1 tablet (500 mg total) by mouth every 6 (six) hours as needed for muscle spasms.  . metoprolol succinate (TOPROL-XL) 100 MG 24 hr tablet Take 1 tablet (100 mg total) by mouth every morning.  . metoprolol succinate (TOPROL-XL) 25 MG 24 hr tablet Take 1 tablet (25 mg total) by mouth every morning.  . nabumetone (RELAFEN) 500 MG tablet Take 1 tablet (500 mg total) by mouth 2 (two) times daily.  . tamsulosin (FLOMAX) 0.4 MG CAPS capsule Take 1 capsule (0.4 mg total) by mouth daily after supper.  [name] tiZANidine (ZANAFLEX) 4 MG tablet Take 1 tablet (4 mg total) by mouth at bedtime.  . [DISCONTINUED] amLODipine (NORVASC) 10 MG tablet Take 1 tablet (10 mg total) by mouth  daily.    Allergies: Celebrex  [celecoxib]; Tetanus toxoids; and Tetanus-diphtheria toxoids td  Social History   Tobacco Use  . Smoking status: Never Smoker  . Smokeless tobacco: Never Used  Substance Use Topics  . Alcohol use: No  . Drug use: No    Family History  Problem Relation Age of Onset  . Hypertension Mother   . Cancer Mother   . Hyperlipidemia Mother   . Stroke Father   . Heart disease Father   . Depression Sister   . Hypertension Sister   . Hyperlipidemia Sister     Review of Systems: A 12-system review of systems was performed and was negative except as noted in the HPI.  --------------------------------------------------------------------------------------------------  Physical Exam: BP (!) 170/80 (BP Location: Right Arm, Patient Position: Sitting, Cuff Size: Large)   Pulse 72   Ht 6\' 3"  (1.905 m)   Wt 280 lb 8 oz (127.2 kg)   SpO2 97%   BMI 35.06 kg/m   General:  NAD. HEENT: No conjunctival pallor or scleral icterus.  Moist mucous membranes. OP clear. Neck: Supple without lymphadenopathy, thyromegaly, JVD, or HJR. No carotid bruit. Lungs: Normal work of breathing. Clear to auscultation bilaterally without wheezes or crackles. Heart: Regular rate and rhythm without murmurs, rubs, or gallops. Non-displaced PMI. Abd: Bowel sounds present. Soft, NT/ND without hepatosplenomegaly Ext: 1+ ankle and distal calf edema bilaterally. Radial, PT, and DP pulses are 2+ bilaterally Skin: Warm and dry without rash. Neuro: CNIII-XII intact. Strength and fine-touch sensation intact in upper and lower extremities bilaterally. Psych: Normal mood and affect.  EKG:  Normal sinus rhythm without abnromalities.  Lab Results  Component Value Date   WBC 5.9 04/24/2017   HGB 14.4 04/24/2017   HCT 42.7 04/24/2017   MCV 93 04/24/2017   PLT 216 04/24/2017    Lab Results  Component Value Date   NA 146 (H) 09/17/2017   K 3.2 (L) 09/17/2017   CL 100 09/17/2017   CO2 28  09/17/2017   BUN 24 09/17/2017   CREATININE 1.09 09/17/2017   GLUCOSE 213 (H) 09/17/2017   ALT 16 09/17/2017    Lab Results  Component Value Date   CHOL 140 09/17/2017   HDL 32 (L) 09/17/2017   LDLCALC 80 09/17/2017   TRIG 141 09/17/2017   CHOLHDL 3.8 06/03/2015     --------------------------------------------------------------------------------------------------  ASSESSMENT AND PLAN: Lower extremity edema Present for at least 6 months and likely multifactorial.  Given a dependent component, there is likely an element of venous insufficiency.  Other than dyspnea when bending over, Mr. Joslyn does not have other heart failure symptoms.  I suspect that he has underlying sleep apnea, which could be exacerbating his edema.  High-dose amlodipine may also be contributing.  We have agreed to obtain a transthoracic echocardiogram and decrease amlodipine to 5 mg daily.  I will add spironolactone 25 mg daily today.  I have encouraged Mr. Jeffus to proceed with sleep study, as previously ordered by his PCP.  Hypertension BP not well-controlled.  Given history of hypokalemia and hypernatremia, I will check a serum aldosterone and plasma renin activity panel as well as start empiric spironolactone 25 mg daily.  We will obtain a BMP today and in ~1 week (BP check at that time as well).  Mr. Merkey is to proceed with sleep study as well.  Sodium restriction was encouraged.  Follow-up: Return to clinic in ~1 month.  Nelva Bush, MD 02/08/2018 1:51 PM

## 2018-02-08 NOTE — Patient Instructions (Signed)
Medication Instructions:  START Spironolactone 25 mg daily DECREASE the Amlodipine to 5 mg daily  If you need a refill on your cardiac medications before your next appointment, please call your pharmacy.   Lab work: Your provider would like for you to have the following labs today: BMET and Aldosterone/Renin  If you have labs (blood work) drawn today and your tests are completely normal, you will receive your results only by: [name] MyChart Message (if you have MyChart) OR . A paper copy in the mail If you have any lab test that is abnormal or we need to change your treatment, we will call you to review the results.  Your provider would like for you to return in one week to have the following labs drawn: BMET. Please go to the [facility] entrance and check in at the front desk. You do not need an appointment.    Testing/Procedures: Your physician has requested that you have an echocardiogram. Echocardiography is a painless test that uses sound waves to create images of your heart. It provides your doctor with information about the size and shape of your heart and how well your heart's chambers and valves are working. You may receive an ultrasound enhancing agent through an IV if needed to better visualize your heart during the echo.This procedure takes approximately one hour. There are no restrictions for this procedure. This will take place at the [facility] clinic.    Follow-Up: At [facility], you and your health needs are our priority.  As part of our continuing mission to provide you with exceptional heart care, we have created designated Provider Care Teams.  These Care Teams include your primary Cardiologist (physician) and Advanced Practice Providers (APPs -  Physician Assistants and Nurse Practitioners) who all work together to provide you with the care you need, when you need it. You will need a follow up appointment in 1 months.  Please call our office 2 months in  advance to schedule this appointment.  You may see Dr. [name] or one of the following Advanced Practice Providers on your designated Care Team:   Murray Hodgkins, NP Christell Faith, PA-C . Marrianne Mood, PA-C  Your physician recommends that you schedule a follow-up appointment in: one week for a blood pressure check, BMET and echo on the same day.     DASH Eating Plan DASH stands for "Dietary Approaches to Stop Hypertension." The DASH eating plan is a healthy eating plan that has been shown to reduce high blood pressure (hypertension). It may also reduce your risk for type 2 diabetes, heart disease, and stroke. The DASH eating plan may also help with weight loss. What are tips for following this plan? General guidelines  Avoid eating more than 2,300 mg (milligrams) of salt (sodium) a day. If you have hypertension, you may need to reduce your sodium intake to 1,500 mg a day.  Limit alcohol intake to no more than 1 drink a day for nonpregnant women and 2 drinks a day for men. One drink equals 12 oz of beer, 5 oz of wine, or 1 oz of hard liquor.  Work with your health care provider to maintain a healthy body weight or to lose weight. Ask what an ideal weight is for you.  Get at least 30 minutes of exercise that causes your heart to beat faster (aerobic exercise) most days of the week. Activities may include walking, swimming, or biking.  Work with your health care provider or diet and nutrition  specialist (dietitian) to adjust your eating plan to your individual calorie needs. Reading food labels  Check food labels for the amount of sodium per serving. Choose foods with less than 5 percent of the Daily Value of sodium. Generally, foods with less than 300 mg of sodium per serving fit into this eating plan.  To find whole grains, look for the word "whole" as the first word in the ingredient list. Shopping  Buy products labeled as "low-sodium" or "no salt added."  Buy fresh foods. Avoid  canned foods and premade or frozen meals. Cooking  Avoid adding salt when cooking. Use salt-free seasonings or herbs instead of table salt or sea salt. Check with your health care provider or pharmacist before using salt substitutes.  Do not fry foods. Cook foods using healthy methods such as baking, boiling, grilling, and broiling instead.  Cook with heart-healthy oils, such as olive, canola, soybean, or sunflower oil. Meal planning   Eat a balanced diet that includes: ? 5 or more servings of fruits and vegetables each day. At each meal, try to fill half of your plate with fruits and vegetables. ? Up to 6-8 servings of whole grains each day. ? Less than 6 oz of lean meat, poultry, or fish each day. A 3-oz serving of meat is about the same size as a deck of cards. One egg equals 1 oz. ? 2 servings of low-fat dairy each day. ? A serving of nuts, seeds, or beans 5 times each week. ? Heart-healthy fats. Healthy fats called Omega-3 fatty acids are found in foods such as flaxseeds and coldwater fish, like sardines, salmon, and mackerel.  Limit how much you eat of the following: ? Canned or prepackaged foods. ? Food that is high in trans fat, such as fried foods. ? Food that is high in saturated fat, such as fatty meat. ? Sweets, desserts, sugary drinks, and other foods with added sugar. ? Full-fat dairy products.  Do not salt foods before eating.  Try to eat at least 2 vegetarian meals each week.  Eat more home-cooked food and less restaurant, buffet, and fast food.  When eating at a restaurant, ask that your food be prepared with less salt or no salt, if possible. What foods are recommended? The items listed may not be a complete list. Talk with your dietitian about what dietary choices are best for you. Grains Whole-grain or whole-wheat bread. Whole-grain or whole-wheat pasta. Brown rice. [place]. Bulgur. Whole-grain and low-sodium cereals. Pita bread. Low-fat, low-sodium  crackers. Whole-wheat flour tortillas. Vegetables Fresh or frozen vegetables (raw, steamed, roasted, or grilled). Low-sodium or reduced-sodium tomato and vegetable juice. Low-sodium or reduced-sodium tomato sauce and tomato paste. Low-sodium or reduced-sodium canned vegetables. Fruits All fresh, dried, or frozen fruit. Canned fruit in natural juice (without added sugar). Meat and other protein foods Skinless chicken or [place]. Ground chicken or [place]. Pork with fat trimmed off. Fish and seafood. Egg whites. Dried beans, peas, or lentils. Unsalted nuts, nut butters, and seeds. Unsalted canned beans. Lean cuts of beef with fat trimmed off. Low-sodium, lean deli meat. Dairy Low-fat (1%) or fat-free (skim) milk. Fat-free, low-fat, or reduced-fat cheeses. Nonfat, low-sodium ricotta or cottage cheese. Low-fat or nonfat yogurt. Low-fat, low-sodium cheese. Fats and oils Soft margarine without trans fats. Vegetable oil. Low-fat, reduced-fat, or light mayonnaise and salad dressings (reduced-sodium). Canola, safflower, olive, soybean, and sunflower oils. Avocado. Seasoning and other foods Herbs. Spices. Seasoning mixes without salt. Unsalted popcorn and pretzels. Fat-free sweets. What foods  are not recommended? The items listed may not be a complete list. Talk with your dietitian about what dietary choices are best for you. Grains Baked goods made with fat, such as croissants, muffins, or some breads. Dry pasta or rice meal packs. Vegetables Creamed or fried vegetables. Vegetables in a cheese sauce. Regular canned vegetables (not low-sodium or reduced-sodium). Regular canned tomato sauce and paste (not low-sodium or reduced-sodium). Regular tomato and vegetable juice (not low-sodium or reduced-sodium). Angie Fava. Olives. Fruits Canned fruit in a light or heavy syrup. Fried fruit. Fruit in cream or butter sauce. Meat and other protein foods Fatty cuts of meat. Ribs. Fried meat. Berniece Salines. Sausage. Bologna and  other processed lunch meats. Salami. Fatback. Hotdogs. Bratwurst. Salted nuts and seeds. Canned beans with added salt. Canned or smoked fish. Whole eggs or egg yolks. Chicken or [place] with skin. Dairy Whole or 2% milk, cream, and half-and-half. Whole or full-fat cream cheese. Whole-fat or sweetened yogurt. Full-fat cheese. Nondairy creamers. Whipped toppings. Processed cheese and cheese spreads. Fats and oils Butter. Stick margarine. Lard. Shortening. Ghee. Bacon fat. Tropical oils, such as coconut, palm kernel, or palm oil. Seasoning and other foods Salted popcorn and pretzels. Onion salt, garlic salt, seasoned salt, table salt, and sea salt. Worcestershire sauce. Tartar sauce. Barbecue sauce. Teriyaki sauce. Soy sauce, including reduced-sodium. Steak sauce. Canned and packaged gravies. Fish sauce. Oyster sauce. Cocktail sauce. Horseradish that you find on the shelf. Ketchup. Mustard. Meat flavorings and tenderizers. Bouillon cubes. Hot sauce and Tabasco sauce. Premade or packaged marinades. Premade or packaged taco seasonings. Relishes. Regular salad dressings. Where to find more information:  National Heart, Lung, and [place]: https://wilson-eaton.com/  American Heart Association: www.heart.org Summary  The DASH eating plan is a healthy eating plan that has been shown to reduce high blood pressure (hypertension). It may also reduce your risk for type 2 diabetes, heart disease, and stroke.  With the DASH eating plan, you should limit salt (sodium) intake to 2,300 mg a day. If you have hypertension, you may need to reduce your sodium intake to 1,500 mg a day.  When on the DASH eating plan, aim to eat more fresh fruits and vegetables, whole grains, lean proteins, low-fat dairy, and heart-healthy fats.  Work with your health care provider or diet and nutrition specialist (dietitian) to adjust your eating plan to your individual calorie needs. This information is not intended to replace advice  given to you by your health care provider. Make sure you discuss any questions you have with your health care provider. Document Released: 03/09/2011 Document Revised: 03/13/2016 Document Reviewed: 03/13/2016 Elsevier Interactive Patient Education  [employer].

## 2018-02-12 LAB — ALDOSTERONE + RENIN ACTIVITY W/ RATIO
ALDOS/RENIN RATIO: >30.5 — ABNORMAL HIGH (ref 0.0–30.0)
ALDOSTERONE: 5.1 ng/dL (ref 0.0–30.0)

## 2018-02-12 LAB — BASIC METABOLIC PANEL
BUN / CREAT RATIO: 18 (ref 10–24)
BUN: 19 mg/dL (ref 8–27)
CO2: 28 mmol/L (ref 20–29)
Calcium: 9.4 mg/dL (ref 8.6–10.2)
Chloride: 96 mmol/L (ref 96–106)
Creatinine, Ser: 1.06 mg/dL (ref 0.76–1.27)
GFR calc Af Amer: 84 mL/min/{1.73_m2} (ref >59)
GFR calc non Af Amer: 72 mL/min/{1.73_m2} (ref >59)
GLUCOSE: 267 mg/dL — AB (ref 65–99)
POTASSIUM: 3.5 mmol/L (ref 3.5–5.2)
SODIUM: 141 mmol/L (ref 134–144)

## 2018-02-14 ENCOUNTER — Telehealth: Payer: Self-pay | Admitting: *Deleted

## 2018-02-14 NOTE — Telephone Encounter (Signed)
No answer. Left message to call back.   

## 2018-02-14 NOTE — Telephone Encounter (Signed)
-----   Message from Nelva Bush, MD sent at 02/14/2018  7:37 AM EST ----- Please let Evan Rivas know that his renal function and electrolytes are normal.  His serum aldosterone is normal, his renal and activity is undetectable with an elevated aldosterone to renal activity ratio.  This suggests that there could be underlying hyperaldosteronism contributing to his hypertension.  I recommend that he continue his current medications.  I also suggest that we refer him to endocrinology for further work-up.  He should also return for repeat BMP today or tomorrow to ensure stable renal function and potassium.

## 2018-02-15 NOTE — Telephone Encounter (Signed)
Reviewed results and recommendations with patient and he is coming in on Tuesday to have labs and other testing done. Patient does not have preference for endocrinology and advised that if he should have a preference to let us know on Tuesday and by then we can reach out to [facility] to see if we can get him there. He verbalized understanding of our conversation, agreement with plan, and had no further questions at this time.

## 2018-02-15 NOTE — Telephone Encounter (Signed)
Patient returning call.

## 2018-02-18 ENCOUNTER — Other Ambulatory Visit: Payer: Self-pay | Admitting: Internal Medicine

## 2018-02-18 DIAGNOSIS — R600 Localized edema: Secondary | ICD-10-CM

## 2018-02-19 ENCOUNTER — Ambulatory Visit (INDEPENDENT_AMBULATORY_CARE_PROVIDER_SITE_OTHER): Payer: Medicare PPO

## 2018-02-19 ENCOUNTER — Ambulatory Visit (INDEPENDENT_AMBULATORY_CARE_PROVIDER_SITE_OTHER): Payer: Medicare PPO | Admitting: *Deleted

## 2018-02-19 ENCOUNTER — Telehealth: Payer: Self-pay

## 2018-02-19 ENCOUNTER — Other Ambulatory Visit (INDEPENDENT_AMBULATORY_CARE_PROVIDER_SITE_OTHER): Payer: Medicare PPO

## 2018-02-19 VITALS — BP 170/90 | HR 63

## 2018-02-19 DIAGNOSIS — I10 Essential (primary) hypertension: Secondary | ICD-10-CM

## 2018-02-19 DIAGNOSIS — R600 Localized edema: Secondary | ICD-10-CM

## 2018-02-19 MED ORDER — GABAPENTIN 300 MG PO CAPS: 300.0000 mg | ORAL_CAPSULE | Freq: Three times a day (TID) | ORAL | 4 refills | Status: DC

## 2018-02-19 NOTE — Progress Notes (Signed)
1.) Reason for visit: BP check  2.) Name of MD requesting visit: End  3.) H&P: The patient was seen in clinic on 02/08/18 with Dr. [name]. He had complaints of lower extremity edema at the time. He was also being followed for hypertension- BP 170/80 in clinic that day. He was on amlodipine 10 mg once daily and Dr. [name] decreased this to 5 mg once daily and added spironolactone 25 mg once daily. He was instructed to come in today for an echo, BMP, and repeat BP check with the nurse. BP (HR) 170/90 (63) today. Confirmed medications with the patient, however, he did not take his meds this morning prior to coming in. He does confirm that most of the BP readings he has taken at home were prior to taking his morning medications.  4.) ROS related to problem: Confirmed with the patient that his lower extremity edema is much improved on lower dose amlodipine, though not completely resolved. He states he is having trouble cutting the amlodipine tablet in half, so any parts that break off, he will just throw away and take the rest of the 1/2 tablet.   5.) Assessment and plan per MD: I have advised the patient that we need to know what his BP is running with his medications in his system. I have asked that he take his BP daily in the morning about 30 minutes- 1 hour after taking his AM meds and record these readings. He is aware to call the office in 5-7 days with these readings. I have also explained to him that I will forward his reading to Dr. [name] from today and we will call him with any further recommendations. He is aware that if Dr. [name] wants him to continue on amlodipine 5 mg once daily, we can call in a 5 mg strength tablet to the pharmacy so he doesn't have to cut the tablet in 1/2 and miss some of the dose possibly. I have advised when he returns to the office, we may need him to bring his home BP monitor with him for [place] to correlate his cuff. The patient voices understanding and is agreeable with the above. Will  forward to Dr. [name] for any further recommendations.

## 2018-02-19 NOTE — Telephone Encounter (Signed)
Pt came into office and stated that he was getting 90 gabapentin taking 3 a day and most recent prescription was for 30 and wanted to know if this was correct pt uses [facility] on [address]

## 2018-02-19 NOTE — Progress Notes (Signed)
Blood pressure not well controlled, but in the setting of the patient not having taken any of his medications, it is difficult to know if titration is needed.  I agree that blood pressure should be monitored at home over the next week.  If blood pressure remains over 140/90, Mr. Logan should contact us for further instructions.  Nelva Bush, MD [facility] Pager: [phone]

## 2018-02-19 NOTE — Telephone Encounter (Signed)
Spoke with [facility], the script that was recently filled was dated 05/07/17, with a quantity of 30. He has already used all refills for the script Dr. [name] wrote 08-2017.  Dr. [name], Will you send a new script for Gabapentin with quantity of 90?[name] Next appt 02-26-18.

## 2018-02-19 NOTE — Patient Instructions (Addendum)
Medication Instructions: - no changes  Labwork: - done today  Procedures/Testing: - none ordered  Follow-Up: - as scheduled  Any Additional Special Instructions Will Be Listed Below (If Applicable).  - Please check your blood pressure every day, once day  - wait at least 30 minutes- 1 hour after taking your medication before you check your blood pressure - record these for 5-7 days and call us with the readings [phone]    If you need a refill on your cardiac medications before your next appointment, please call your pharmacy.

## 2018-02-20 LAB — BASIC METABOLIC PANEL
BUN/Creatinine Ratio: 17 (ref 10–24)
BUN: 18 mg/dL (ref 8–27)
CHLORIDE: 100 mmol/L (ref 96–106)
CO2: 25 mmol/L (ref 20–29)
Calcium: 9.1 mg/dL (ref 8.6–10.2)
Creatinine, Ser: 1.07 mg/dL (ref 0.76–1.27)
GFR calc non Af Amer: 71 mL/min/{1.73_m2} (ref >59)
GFR, EST AFRICAN AMERICAN: 83 mL/min/{1.73_m2} (ref >59)
Glucose: 174 mg/dL — ABNORMAL HIGH (ref 65–99)
Potassium: 3.4 mmol/L — ABNORMAL LOW (ref 3.5–5.2)
Sodium: 141 mmol/L (ref 134–144)

## 2018-02-21 NOTE — Telephone Encounter (Signed)
Referral info including demographics, lab work and office note faxed to [facility] at [phone].

## 2018-02-26 ENCOUNTER — Ambulatory Visit
Payer: Medicare PPO | Attending: Student in an Organized Health Care Education/Training Program | Admitting: Student in an Organized Health Care Education/Training Program

## 2018-02-26 ENCOUNTER — Encounter: Payer: Self-pay | Admitting: Student in an Organized Health Care Education/Training Program

## 2018-02-26 ENCOUNTER — Other Ambulatory Visit: Payer: Self-pay

## 2018-02-26 ENCOUNTER — Telehealth: Payer: Self-pay | Admitting: Internal Medicine

## 2018-02-26 VITALS — BP 161/72 | HR 70 | Temp 98.0°F | Resp 16 | Ht 75.0 in | Wt 280.0 lb

## 2018-02-26 DIAGNOSIS — N400 Enlarged prostate without lower urinary tract symptoms: Secondary | ICD-10-CM | POA: Diagnosis not present

## 2018-02-26 DIAGNOSIS — M25562 Pain in left knee: Secondary | ICD-10-CM | POA: Diagnosis not present

## 2018-02-26 DIAGNOSIS — Z79899 Other long term (current) drug therapy: Secondary | ICD-10-CM | POA: Insufficient documentation

## 2018-02-26 DIAGNOSIS — K219 Gastro-esophageal reflux disease without esophagitis: Secondary | ICD-10-CM | POA: Diagnosis not present

## 2018-02-26 DIAGNOSIS — E119 Type 2 diabetes mellitus without complications: Secondary | ICD-10-CM | POA: Insufficient documentation

## 2018-02-26 DIAGNOSIS — E669 Obesity, unspecified: Secondary | ICD-10-CM | POA: Insufficient documentation

## 2018-02-26 DIAGNOSIS — Z7984 Long term (current) use of oral hypoglycemic drugs: Secondary | ICD-10-CM | POA: Insufficient documentation

## 2018-02-26 DIAGNOSIS — F419 Anxiety disorder, unspecified: Secondary | ICD-10-CM | POA: Diagnosis not present

## 2018-02-26 DIAGNOSIS — Z96653 Presence of artificial knee joint, bilateral: Secondary | ICD-10-CM

## 2018-02-26 DIAGNOSIS — Z7982 Long term (current) use of aspirin: Secondary | ICD-10-CM | POA: Diagnosis not present

## 2018-02-26 DIAGNOSIS — Z886 Allergy status to analgesic agent status: Secondary | ICD-10-CM | POA: Insufficient documentation

## 2018-02-26 DIAGNOSIS — Z5181 Encounter for therapeutic drug level monitoring: Secondary | ICD-10-CM | POA: Insufficient documentation

## 2018-02-26 DIAGNOSIS — M170 Bilateral primary osteoarthritis of knee: Secondary | ICD-10-CM | POA: Diagnosis not present

## 2018-02-26 DIAGNOSIS — M47816 Spondylosis without myelopathy or radiculopathy, lumbar region: Secondary | ICD-10-CM

## 2018-02-26 DIAGNOSIS — I10 Essential (primary) hypertension: Secondary | ICD-10-CM | POA: Insufficient documentation

## 2018-02-26 DIAGNOSIS — M25551 Pain in right hip: Secondary | ICD-10-CM | POA: Diagnosis not present

## 2018-02-26 DIAGNOSIS — F331 Major depressive disorder, recurrent, moderate: Secondary | ICD-10-CM | POA: Diagnosis not present

## 2018-02-26 DIAGNOSIS — Z6835 Body mass index (BMI) 35.0-35.9, adult: Secondary | ICD-10-CM | POA: Insufficient documentation

## 2018-02-26 DIAGNOSIS — R600 Localized edema: Secondary | ICD-10-CM

## 2018-02-26 DIAGNOSIS — M25561 Pain in right knee: Secondary | ICD-10-CM | POA: Diagnosis not present

## 2018-02-26 DIAGNOSIS — M5136 Other intervertebral disc degeneration, lumbar region: Secondary | ICD-10-CM

## 2018-02-26 DIAGNOSIS — M545 Low back pain: Secondary | ICD-10-CM | POA: Diagnosis not present

## 2018-02-26 DIAGNOSIS — M792 Neuralgia and neuritis, unspecified: Secondary | ICD-10-CM | POA: Diagnosis not present

## 2018-02-26 DIAGNOSIS — Z8249 Family history of ischemic heart disease and other diseases of the circulatory system: Secondary | ICD-10-CM | POA: Insufficient documentation

## 2018-02-26 DIAGNOSIS — G894 Chronic pain syndrome: Secondary | ICD-10-CM

## 2018-02-26 DIAGNOSIS — E785 Hyperlipidemia, unspecified: Secondary | ICD-10-CM | POA: Insufficient documentation

## 2018-02-26 DIAGNOSIS — G8929 Other chronic pain: Secondary | ICD-10-CM

## 2018-02-26 MED ORDER — HYDROCODONE-ACETAMINOPHEN 10-325 MG PO TABS: 1.0000 | ORAL_TABLET | Freq: Three times a day (TID) | ORAL | 0 refills | Status: DC | PRN

## 2018-02-26 MED ORDER — HYDROCODONE-ACETAMINOPHEN 10-325 MG PO TABS: 1.0000 | ORAL_TABLET | Freq: Three times a day (TID) | ORAL | 0 refills | Status: AC | PRN

## 2018-02-26 NOTE — Progress Notes (Signed)
Patient's Name: Evan Rivas  MRN: [id number]  Referring Provider: Hubbard Hartshorn, FNP  DOB: 1950/08/10  PCP: Hubbard Hartshorn, FNP  DOS: 02/26/2018  Note by: Gillis Santa, MD  Service setting: Ambulatory outpatient  Specialty: Interventional Pain Management  Location: [facility] (AMB) Pain Management Facility    Patient type: Established   Primary Reason(s) for Visit: Encounter for prescription drug management. (Level of risk: moderate)  CC: Back Pain (lower) and Knee Pain (bilateral)  HPI  Evan Rivas is a 67 y.o. year old, male patient, who comes today for a medication management evaluation. He has Allergic rhinitis; Anxiety; Continuous opioid dependence ([place]); DDD (degenerative disc disease), lumbar; Gout of foot; Essential hypertension; Flu vaccine need; Adiposity; OA (osteoarthritis) of knee; Neuralgia neuritis, sciatic nerve; Arthralgia of multiple joints; H/O malignant neoplasm of skin; Hyperlipidemia; Major depressive disorder, recurrent episode, moderate ([place]); Chronic low back pain; Arthralgia of both knees; Type 2 diabetes mellitus without complication ([place]); Depression; BPH without urinary obstruction; and Lower extremity edema on their problem list. His primarily concern today is the Back Pain (lower) and Knee Pain (bilateral)  Pain Assessment: Location: Lower Back Radiating: right hip Onset: More than a month ago Duration: Chronic pain Quality: Aching, Discomfort(Aggraviating) Severity: 3 /10 (subjective, self-reported pain score)  Note: Reported level is compatible with observation.                         When using our objective Pain Scale, levels between 6 and 10/10 are said to belong in an emergency room, as it progressively worsens from a 6/10, described as severely limiting, requiring emergency care not usually available at an outpatient pain management facility. At a 6/10 level, communication becomes difficult and requires great effort. Assistance to reach the emergency  department may be required. Facial flushing and profuse sweating along with potentially dangerous increases in heart rate and blood pressure will be evident. Effect on ADL: prolonged walking and standing, twisting, lifting Timing: Constant Modifying factors: [place] BP: (!) 161/72  HR: 70  Evan Rivas was last scheduled for an appointment on 11/27/2017 for medication management. During today's appointment we reviewed Evan Rivas's chronic pain status, as well as his outpatient medication regimen.  No significant changes in pain history.  Of note patient's blood pressure medication was changed to reduce his swelling in his leg.  He states that this is made a difference.  The patient  reports that he does not use drugs. His body mass index is 35 kg/m.  Further details on both, my assessment(s), as well as the proposed treatment plan, please see below.  Controlled Substance Pharmacotherapy Assessment REMS (Risk Evaluation and Mitigation Strategy)  Analgesic: Norco 10 mg 3 times daily as needed, quantity 90/month MME/day: 30 mg/day.  Ignatius Specking, RN  02/26/2018 10:04 AM  Sign at close encounter Nursing Pain Medication Assessment:  Safety precautions to be maintained throughout the outpatient stay will include: orient to surroundings, keep bed in low position, maintain call bell within reach at all times, provide assistance with transfer out of bed and ambulation.  Medication Inspection Compliance: Pill count conducted under aseptic conditions, in front of the patient. Neither the pills nor the bottle was removed from the patient's sight at any time. Once count was completed pills were immediately returned to the patient in their original bottle.  Medication: Hydrocodone/APAP Pill/Patch Count: 42 of 90 pills remain Pill/Patch Appearance: Markings consistent with prescribed medication Bottle Appearance: Standard pharmacy container. Clearly labeled.  Filled Date: 90 / 8 / 2019 Last  Medication intake:  Yesterday   Pharmacokinetics: Liberation and absorption (onset of action): WNL Distribution (time to peak effect): WNL Metabolism and excretion (duration of action): WNL         Pharmacodynamics: Desired effects: Analgesia: Evan Rivas reports >50% benefit. Functional ability: Patient reports that medication allows him to accomplish basic ADLs Clinically meaningful improvement in function (CMIF): Sustained CMIF goals met Perceived effectiveness: Described as relatively effective, allowing for increase in activities of daily living (ADL) Undesirable effects: Side-effects or Adverse reactions: None reported Monitoring: [place]: Online review of the past 12-monthperiod conducted. Compliant with practice rules and regulations Last UDS on record: Summary  Date Value Ref Range Status  05/07/2017 FINAL  Final    Comment:    ==================================================================== TOXASSURE COMP DRUG ANALYSIS,UR ==================================================================== Test                             Result       Flag       Units Drug Present and Declared for Prescription Verification   Hydrocodone                    396          EXPECTED   ng/mg creat   Hydromorphone                  78           EXPECTED   ng/mg creat   Dihydrocodeine                 213          EXPECTED   ng/mg creat   Norhydrocodone                 814          EXPECTED   ng/mg creat    Sources of hydrocodone include scheduled prescription    medications. Hydromorphone, dihydrocodeine and norhydrocodone are    expected metabolites of hydrocodone. Hydromorphone and    dihydrocodeine are also available as scheduled prescription    medications.   Fluoxetine                     PRESENT      EXPECTED   Norfluoxetine                  PRESENT      EXPECTED    Norfluoxetine is an expected metabolite of fluoxetine.   Acetaminophen                  PRESENT      EXPECTED    Metoprolol                     PRESENT      EXPECTED Drug Absent but Declared for Prescription Verification   Gabapentin                     Not Detected UNEXPECTED   Tizanidine                     Not Detected UNEXPECTED    Tizanidine, as indicated in the declared medication list, is not    always detected even when used as directed.   Methocarbamol  Not Detected UNEXPECTED ==================================================================== Test                      Result    Flag   Units      Ref Range   Creatinine              129              mg/dL      >=20 ==================================================================== Declared Medications:  The flagging and interpretation on this report are based on the  following declared medications.  Unexpected results may arise from  inaccuracies in the declared medications.  **Note: The testing scope of this panel includes these medications:  Fluoxetine (Prozac)  Gabapentin (Neurontin)  Hydrocodone (Norco)  Methocarbamol (Robaxin)  Metoprolol (Toprol)  **Note: The testing scope of this panel does not include small to  moderate amounts of these reported medications:  Acetaminophen (Norco)  Tizanidine (Zanaflex)  **Note: The testing scope of this panel does not include following  reported medications:  Allopurinol (Zyloprim)  Amlodipine (Norvasc)  Atorvastatin (Lipitor)  Fluticasone (Flonase)  Glipizide (Glucotrol)  Hydrochlorothiazide (Hydrodiuril)  Losartan (Cozaar)  Metformin  Nabumetone (Relafen)  Rivaroxaban (Xarelto)  Tamsulosin (Flomax) ==================================================================== For clinical consultation, please call [phone]. ====================================================================    UDS interpretation: Compliant          Medication Assessment Form: Reviewed. Patient indicates being compliant with therapy Treatment compliance: Compliant Risk Assessment  Profile: Aberrant behavior: See prior evaluations. None observed or detected today Comorbid factors increasing risk of overdose: See prior notes. No additional risks detected today Opioid risk tool (ORT) (Total Score): 3 Personal History of Substance Abuse (SUD-Substance use disorder):  Alcohol: Negative  Illegal Drugs: Negative  Rx Drugs: Negative  ORT Risk Level calculation: Low Risk Risk of substance use disorder (SUD): Low Opioid Risk Tool - 02/26/18 0958      Personal History of Substance Abuse   Alcohol  Negative    Illegal Drugs  Negative    Rx Drugs  Negative      Age   Age between 78-45 years   No      History of Preadolescent Sexual Abuse   History of Preadolescent Sexual Abuse  Negative or Male      Psychological Disease   Psychological Disease  Positive    ADD  Negative    OCD  Negative    Bipolar  Negative    Schizophrenia  Negative    Depression  Positive      Total Score   Opioid Risk Tool Scoring  3    Opioid Risk Interpretation  Low Risk      ORT Scoring interpretation table:  Score <3 = Low Risk for SUD  Score between 4-7 = Moderate Risk for SUD  Score >8 = High Risk for Opioid Abuse   Risk Mitigation Strategies:  Patient Counseling: Covered Patient-Prescriber Agreement (PPA): Present and active  Notification to other healthcare providers: Done  Pharmacologic Plan: No change in therapy, at this time.             Laboratory Chemistry  Inflammation Markers (CRP: Acute Phase) (ESR: Chronic Phase) Lab Results  Component Value Date   LATICACIDVEN 1.5 07/19/2016                         Rheumatology Markers Lab Results  Component Value Date   LABURIC 4.9 09/17/2017  Renal Function Markers Lab Results  Component Value Date   BUN 18 02/19/2018   CREATININE 1.07 02/19/2018   BCR 17 02/19/2018   GFRAA 83 02/19/2018   GFRNONAA 71 02/19/2018                             Hepatic Function Markers Lab Results  Component  Value Date   AST 13 09/17/2017   ALT 16 09/17/2017   ALBUMIN 4.0 09/17/2017   ALKPHOS 84 09/17/2017                        Electrolytes Lab Results  Component Value Date   NA 141 02/19/2018   K 3.4 (L) 02/19/2018   CL 100 02/19/2018   CALCIUM 9.1 02/19/2018   MG 1.3 (L) 07/19/2016                        Neuropathy Markers Lab Results  Component Value Date   HGBA1C 8.0 (A) 12/18/2017                        CNS Tests No results found for: COLORCSF, APPEARCSF, RBCCOUNTCSF, WBCCSF, POLYSCSF, LYMPHSCSF, EOSCSF, PROTEINCSF, GLUCCSF, JCVIRUS, CSFOLI, IGGCSF                      Bone Pathology Markers No results found for: VD25OH, H139778, [id number], [id number], 25OHVITD1, 25OHVITD2, 25OHVITD3, TESTOFREE, TESTOSTERONE                       Coagulation Parameters Lab Results  Component Value Date   INR 0.97 10/18/2016   LABPROT 12.9 10/18/2016   APTT 28 10/18/2016   PLT 216 04/24/2017                        Cardiovascular Markers Lab Results  Component Value Date   BNP 65.0 04/24/2017   HGB 14.4 04/24/2017   HCT 42.7 04/24/2017                         CA Markers No results found for: CEA, CA125, LABCA2                      Note: Lab results reviewed.  Recent Diagnostic Imaging Results  ECHOCARDIOGRAM COMPLETE                         *[facility]                        [place], [place] [address]                            [phone]  ------------------------------------------------------------------- Transthoracic Echocardiography  Patient:    Amed, Datta MR #:       [id number] Study Date: 02/19/2018 Gender:     M Age:        1 Height:     190.5 cm Weight:     127.2 kg BSA:        2.63 m^2 Pt. Status: Room:   ORDERING  Nelva Bush, MD  REFERRING    Nelva Bush, MD  PERFORMING   [place], [place]  SONOGRAPHER  Pilar Jarvis, RVT, RDCS,  RDMS  cc:  ------------------------------------------------------------------- LV EF: 60% -   65%  ------------------------------------------------------------------- Indications:      Edema (R60.0).  ------------------------------------------------------------------- History:   PMH:   Bilateral lower extremity edema.  Risk factors: Lifelong nonsmoker. Hypertension. Diabetes mellitus. Dyslipidemia.   ------------------------------------------------------------------- Study Conclusions  - Left ventricle: The cavity size was normal. Wall thickness was   increased in a pattern of mild LVH. Systolic function was normal.   The estimated ejection fraction was in the range of 60% to 65%.   Wall motion was normal; there were no regional wall motion   abnormalities. Features are consistent with a pseudonormal left   ventricular filling pattern, with concomitant abnormal relaxation   and increased filling pressure (grade 2 diastolic dysfunction). - Mitral valve: There was mild regurgitation. - Left atrium: The atrium was mildly dilated. - Right ventricle: The cavity size was normal. Wall thickness was   normal. Systolic function was normal.  ------------------------------------------------------------------- Study data:  No prior study was available for comparison.  Study status:  Routine.  Procedure:  Transthoracic echocardiography. Image quality was good.  Study completion:  There were no complications.          Transthoracic echocardiography.  M-mode, complete 2D, spectral Doppler, and color Doppler.  Birthdate: Patient birthdate: [birth year].  Age:  Patient is 67 yr old.  Sex: Gender: male.    BMI: 35.1 kg/m^2.  Blood pressure:     158/84 Patient status:  Outpatient.  Study date:  Study date: 02/19/2018. Study time: 08:08 AM.  -------------------------------------------------------------------  ------------------------------------------------------------------- Left ventricle:   The cavity size was normal. Wall thickness was increased in a pattern of mild LVH. Systolic function was normal. The estimated ejection fraction was in the range of 60% to 65%. Wall motion was normal; there were no regional wall motion abnormalities. Features are consistent with a pseudonormal left ventricular filling pattern, with concomitant abnormal relaxation and increased filling pressure (grade 2 diastolic dysfunction).  ------------------------------------------------------------------- Aortic valve:   Probably trileaflet; mildly thickened leaflets. Cusp separation was normal.  Doppler:  Transvalvular velocity was within the normal range. There was no stenosis. There was no regurgitation.    VTI ratio of LVOT to aortic valve: 0.94. Valve area (VTI): 3.91 cm^2. Indexed valve area (VTI): 1.48 cm^2/m^2. Mean velocity ratio of LVOT to aortic valve: 0.86. Valve area (Vmean): 3.56 cm^2. Indexed valve area (Vmean): 1.35 cm^2/m^2. Mean gradient (S): 3 mm Hg.  ------------------------------------------------------------------- Aorta:  Aortic root: The aortic root was normal in size. Ascending aorta: The ascending aorta was normal in size.  ------------------------------------------------------------------- Mitral valve:   Structurally normal valve.   Leaflet separation was normal.  Doppler:  Transvalvular velocity was within the normal range. There was no evidence for stenosis. There was mild regurgitation.    Valve area by pressure half-time: 4 cm^2. Indexed valve area by pressure half-time: 1.52 cm^2/m^2.    Peak gradient (D): 3 mm Hg.  ------------------------------------------------------------------- Left atrium:  The atrium was mildly dilated.  ------------------------------------------------------------------- Right ventricle:  The cavity size was normal. Wall thickness was normal. Systolic function was  normal.  ------------------------------------------------------------------- Pulmonic valve:    Structurally normal valve.   Cusp separation was normal.  Doppler:  Transvalvular velocity was within the normal range. There was no regurgitation.  ------------------------------------------------------------------- Tricuspid valve:   Structurally normal valve.   Leaflet separation was normal.  Doppler:  Transvalvular velocity was within the normal range. There was no regurgitation.  ------------------------------------------------------------------- Pulmonary artery:   The main pulmonary artery was normal-sized. Systolic pressure could not be accurately estimated.  ------------------------------------------------------------------- Right atrium:  The atrium was at the upper limits of normal in size.  ------------------------------------------------------------------- Pericardium:  There was no pericardial effusion.  ------------------------------------------------------------------- Systemic veins: Inferior vena cava: The vessel was normal in size.  ------------------------------------------------------------------- Measurements   Left ventricle                           Value          Reference  LV ID, ED, PLAX chordal                  47    mm       43 - 52  LV ID, ES, PLAX chordal                  31    mm       23 - 38  LV fx shortening, PLAX chordal           34    %        >=29  LV PW thickness, ED                      11    mm       ----------  IVS/LV PW ratio, ED                      1              <=1.3  Stroke volume, 2D                        95    ml       ----------  Stroke volume/bsa, 2D                    36    ml/m^2   ----------  LV ejection fraction, 1-p A4C            54    %        ----------  LV end-diastolic volume, 2-p             120   ml       ----------  LV end-systolic volume, 2-p              57    ml       ----------  LV ejection fraction, 2-p                 53    %        ----------  Stroke volume, 2-p                       63    ml       ----------  LV end-diastolic volume/bsa, 2-p         46    ml/m^2   ----------  LV end-systolic volume/bsa, 2-p          22    ml/m^2   ----------  Stroke volume/bsa, 2-p                   24    ml/m^2   ----------  LV e&',  lateral                           7.07  cm/s     ----------  LV E/e&', lateral                         12.83          ----------  LV e&', medial                            6.96  cm/s     ----------  LV E/e&', medial                          13.03          ----------  LV e&', average                           7.02  cm/s     ----------  LV E/e&', average                         12.93          ----------    Ventricular septum                       Value          Reference  IVS thickness, ED                        11    mm       ----------    LVOT                                     Value          Reference  LVOT ID, S                               23    mm       ----------  LVOT area                                4.15  cm^2     ----------  LVOT ID                                  23    mm       ----------  LVOT mean velocity, S                    67    cm/s     ----------  LVOT VTI, S                              22.9  cm       ----------  Stroke volume (SV), LVOT DP              95.1  ml       ----------  Stroke index (SV/bsa), LVOT DP           36.1  ml/m^2   ----------    Aortic valve                             Value          Reference  Aortic valve mean velocity, S            78.1  cm/s     ----------  Aortic valve VTI, S                      24.3  cm       ----------  Aortic mean gradient, S                  3     mm Hg    ----------  VTI ratio, LVOT/AV                       0.94           ----------  Aortic valve area, VTI                   3.91  cm^2     ----------  Aortic valve area/bsa, VTI               1.48  cm^2/m^2 ----------  Velocity ratio, mean, LVOT/AV             0.86           ----------  Aortic valve area, mean velocity         3.56  cm^2     ----------  Aortic valve area/bsa, mean              1.35  cm^2/m^2 ----------  velocity    Aorta                                    Value          Reference  Aortic root ID, ED                       35    mm       ----------  Ascending aorta ID, A-P, S               31    mm       ----------    Left atrium                              Value          Reference  LA ID, A-P, ES                           44    mm       ----------  LA ID/bsa, A-P                           1.67  cm/m^2   <=2.2  LA volume, S  79.6  ml       ----------  LA volume/bsa, S                         30.2  ml/m^2   ----------  LA volume, ES, 1-p A4C                   79.8  ml       ----------  LA volume/bsa, ES, 1-p A4C               30.3  ml/m^2   ----------  LA volume, ES, 1-p A2C                   68    ml       ----------  LA volume/bsa, ES, 1-p A2C               25.8  ml/m^2   ----------    Mitral valve                             Value          Reference  Mitral E-wave peak velocity              90.7  cm/s     ----------  Mitral A-wave peak velocity              80.5  cm/s     ----------  Mitral deceleration time                 187   ms       150 - 230  Mitral pressure half-time                55    ms       ----------  Mitral peak gradient, D                  3     mm Hg    ----------  Mitral E/A ratio, peak                   1.1            ----------  Mitral valve area, PHT, DP               4     cm^2     ----------  Mitral valve area/bsa, PHT, DP           1.52  cm^2/m^2 ----------    Right atrium                             Value          Reference  RA ID, S-I, ES, A4C              (H)     55.2  mm       34 - 49  RA area, ES, A4C                         16.8  cm^2     8.3 - 19.5  RA volume, ES, A/L                       40.7  ml       ----------  RA volume/bsa, ES, A/L                   15.5   ml/m^2   ----------    Right ventricle                          Value          Reference  TAPSE                                    27.2  mm       ----------  RV s&', lateral, S                        10.9  cm/s     ----------    Pulmonic valve                           Value          Reference  Pulmonic valve peak velocity, S          69.8  cm/s     ----------  Legend: (L)  and  (H)  mark values outside specified reference range.  ------------------------------------------------------------------- Prepared and Electronically Authenticated by  Nelva Bush, MD 2019-11-19T18:05:58  Complexity Note: Imaging results reviewed. Results shared with Evan Rivas, using [employer].                         Meds   Current Outpatient Medications:  .  allopurinol (ZYLOPRIM) 300 MG tablet, Take 1 tablet (300 mg total) by mouth every evening., Disp: 90 tablet, Rfl: 1 .  amLODipine (NORVASC) 5 MG tablet, Take 1 tablet (5 mg total) by mouth daily. (Patient taking differently: Take 2.5 mg by mouth daily. ), Disp: , Rfl:  .  ASPIRIN 81 PO, Take 81 mg by mouth daily. , Disp: , Rfl:  .  atorvastatin (LIPITOR) 40 MG tablet, Take 1 tablet (40 mg total) by mouth at bedtime., Disp: 90 tablet, Rfl: 1 .  FLUoxetine (PROZAC) 40 MG capsule, Take 1 capsule (40 mg total) by mouth daily., Disp: 90 capsule, Rfl: 1 .  fluticasone (FLONASE) 50 MCG/ACT nasal spray, Place 2 sprays into both nostrils daily. 2 sprays, Disp: 16 g, Rfl: 1 .  gabapentin (NEURONTIN) 300 MG capsule, Take 1 capsule (300 mg total) by mouth 3 (three) times daily., Disp: 90 capsule, Rfl: 4 .  glipiZIDE (GLUCOTROL) 10 MG tablet, Take 1 tablet (10 mg total) by mouth daily before breakfast., Disp: 90 tablet, Rfl: 1 .  glipiZIDE (GLUCOTROL) 5 MG tablet, Take 1 tablet (5 mg total) by mouth every evening. With dinner, Disp: 90 tablet, Rfl: 1 .  glucose blood (ACCU-CHEK AVIVA PLUS) test strip, Use as instructed, Disp: 100 each, Rfl: 12 .   hydrochlorothiazide (HYDRODIURIL) 25 MG tablet, Take 1 tablet (25 mg total) by mouth daily., Disp: 90 tablet, Rfl: 1 .  [START ON 03/09/2018] HYDROcodone-acetaminophen (NORCO) 10-325 MG tablet, Take 1 tablet by mouth 3 (three) times daily as needed for severe pain., Disp: 90 tablet, Rfl: 0 .  losartan (COZAAR) 100 MG tablet, Take 1 tablet (100 mg total) by mouth at bedtime., Disp: 90 tablet, Rfl: 1 .  metFORMIN (GLUCOPHAGE XR) 750 MG 24 hr tablet, Take 2 tablets (1,500 mg total) by  mouth daily with breakfast., Disp: 180 tablet, Rfl: 1 .  methocarbamol (ROBAXIN) 500 MG tablet, Take 1 tablet (500 mg total) by mouth every 6 (six) hours as needed for muscle spasms., Disp: 80 tablet, Rfl: 0 .  metoprolol succinate (TOPROL-XL) 100 MG 24 hr tablet, Take 1 tablet (100 mg total) by mouth every morning., Disp: 90 tablet, Rfl: 0 .  metoprolol succinate (TOPROL-XL) 25 MG 24 hr tablet, Take 1 tablet (25 mg total) by mouth every morning., Disp: 90 tablet, Rfl: 0 .  nabumetone (RELAFEN) 500 MG tablet, Take 1 tablet (500 mg total) by mouth 2 (two) times daily., Disp: 180 tablet, Rfl: 0 .  spironolactone (ALDACTONE) 25 MG tablet, TAKE 1 TABLET(25 MG) BY MOUTH DAILY, Disp: 90 tablet, Rfl: 0 .  tamsulosin (FLOMAX) 0.4 MG CAPS capsule, Take 1 capsule (0.4 mg total) by mouth daily after supper., Disp: 90 capsule, Rfl: 0 .  tiZANidine (ZANAFLEX) 4 MG tablet, Take 1 tablet (4 mg total) by mouth at bedtime., Disp: 90 tablet, Rfl: 0 .  [START ON 04/08/2018] HYDROcodone-acetaminophen (NORCO) 10-325 MG tablet, Take 1 tablet by mouth every 8 (eight) hours as needed for severe pain., Disp: 90 tablet, Rfl: 0 .  hydrOXYzine (ATARAX/VISTARIL) 10 MG tablet, Take 1 tablet (10 mg total) by mouth every 8 (eight) hours as needed for anxiety., Disp: 20 tablet, Rfl: 0  ROS  Constitutional: Denies any fever or chills Gastrointestinal: No reported hemesis, hematochezia, vomiting, or acute GI distress Musculoskeletal: Denies any acute onset  joint swelling, redness, loss of ROM, or weakness Neurological: No reported episodes of acute onset apraxia, aphasia, dysarthria, agnosia, amnesia, paralysis, loss of coordination, or loss of consciousness  Allergies  Evan Rivas is allergic to celebrex  [celecoxib]; tetanus toxoids; and tetanus-diphtheria toxoids td.  Evan Rivas  Drug: Evan Rivas  reports that he does not use drugs. Alcohol:  reports that he does not drink alcohol. Tobacco:  reports that he has never smoked. He has never used smokeless tobacco. Medical:  has a past medical history of Anxiety, Arthritis, Cancer ([place]), Depression, Diabetes mellitus without complication ([place]), GERD (gastroesophageal reflux disease), Gout, History of kidney stones, Hyperlipidemia, Hypertension, and Pneumonia. Surgical: Evan Rivas  has a past surgical history that includes Hernia repair; Arthroscopic repair ACL (Left); Mohs surgery; Cystoscopy/ureteroscopy/holmium laser/stent placement (Right, 08/17/2014); Ureteroscopy with holmium laser lithotripsy (Right, 09/07/2014); Cystoscopy w/ ureteral stent placement (Right, 09/07/2014); Vasectomy; Total knee arthroplasty (Left, 04/24/2016); and Total knee arthroplasty (Right, 10/23/2016). Family: family history includes Cancer in his mother; Depression in his sister; Heart disease in his father; Hyperlipidemia in his mother and sister; Hypertension in his mother and sister; Stroke in his father.  Constitutional Exam  General appearance: Well nourished, well developed, and well hydrated. In no apparent acute distress Vitals:   02/26/18 0946  BP: (!) 161/72  Pulse: 70  Resp: 16  Temp: 98 F (36.7 C)  SpO2: 97%  Weight: 280 lb (127 kg)  Height: _0  (1.905 m)   BMI Assessment: Estimated body mass index is 35 kg/m as calculated from the following:   Height as of this encounter: _1  (1.905 m).   Weight as of this encounter: 280 lb (127 kg).  BMI interpretation table: BMI level Category Range association  with higher incidence of chronic pain  <18 kg/m2 Underweight   18.5-24.9 kg/m2 Ideal body weight   25-29.9 kg/m2 Overweight Increased incidence by 20%  30-34.9 kg/m2 Obese (Class I) Increased incidence by 68%  35-39.9 kg/m2 Severe obesity (Class II)  Increased incidence by 136%  >40 kg/m2 Extreme obesity (Class III) Increased incidence by 254%   Patient's current BMI Ideal Body weight  Body mass index is 35 kg/m. Ideal body weight: 84.5 kg (186 lb 4.6 oz) Adjusted ideal body weight: 101.5 kg (223 lb 12.4 oz)   BMI Readings from Last 4 Encounters:  02/26/18 35.00 kg/m  02/08/18 35.06 kg/m  12/18/17 35.24 kg/m  11/27/17 35.60 kg/m   Wt Readings from Last 4 Encounters:  02/26/18 280 lb (127 kg)  02/08/18 280 lb 8 oz (127.2 kg)  12/18/17 281 lb 14.4 oz (127.9 kg)  11/27/17 281 lb (127.5 kg)  Psych/Mental status: Alert, oriented x 3 (person, place, & time)       Eyes: PERLA Respiratory: No evidence of acute respiratory distress  Cervical Spine Area Exam  Skin & Axial Inspection: No masses, redness, edema, swelling, or associated skin lesions Alignment: Symmetrical Functional ROM: Unrestricted ROM      Stability: No instability detected Muscle Tone/Strength: Functionally intact. No obvious neuro-muscular anomalies detected. Sensory (Neurological): Unimpaired Palpation: No palpable anomalies              Upper Extremity (UE) Exam    Side: Right upper extremity  Side: Left upper extremity  Skin & Extremity Inspection: Skin color, temperature, and hair growth are WNL. No peripheral edema or cyanosis. No masses, redness, swelling, asymmetry, or associated skin lesions. No contractures.  Skin & Extremity Inspection: Skin color, temperature, and hair growth are WNL. No peripheral edema or cyanosis. No masses, redness, swelling, asymmetry, or associated skin lesions. No contractures.  Functional ROM: Unrestricted ROM          Functional ROM: Unrestricted ROM          Muscle  Tone/Strength: Functionally intact. No obvious neuro-muscular anomalies detected.  Muscle Tone/Strength: Functionally intact. No obvious neuro-muscular anomalies detected.  Sensory (Neurological): Unimpaired          Sensory (Neurological): Unimpaired          Palpation: No palpable anomalies              Palpation: No palpable anomalies              Provocative Test(s):  Phalen's test: deferred Tinel's test: deferred Apley's scratch test (touch opposite shoulder):  Action 1 (Across chest): deferred Action 2 (Overhead): deferred Action 3 (LB reach): deferred   Provocative Test(s):  Phalen's test: deferred Tinel's test: deferred Apley's scratch test (touch opposite shoulder):  Action 1 (Across chest): deferred Action 2 (Overhead): deferred Action 3 (LB reach): deferred    Thoracic Spine Area Exam  Skin & Axial Inspection: No masses, redness, or swelling Alignment: Symmetrical Functional ROM: Unrestricted ROM Stability: No instability detected Muscle Tone/Strength: Functionally intact. No obvious neuro-muscular anomalies detected. Sensory (Neurological): Unimpaired Muscle strength & Tone: No palpable anomalies  Lumbar Spine Area Exam  Skin & Axial Inspection: No masses, redness, or swelling Alignment: Symmetrical Functional ROM: Unrestricted ROM       Stability: No instability detected Muscle Tone/Strength: Functionally intact. No obvious neuro-muscular anomalies detected. Sensory (Neurological): Unimpaired Palpation: No palpable anomalies       Provocative Tests: Hyperextension/rotation test: deferred today       Lumbar quadrant test (Kemp's test): deferred today       Lateral bending test: deferred today       Patrick's Maneuver: deferred today                   FABER test: deferred  today                   S-I anterior distraction/compression test: deferred today         S-I lateral compression test: deferred today         S-I Thigh-thrust test: deferred today         S-I  Gaenslen's test: deferred today          Gait & Posture Assessment  Ambulation: Unassisted Gait: Relatively normal for age and body habitus Posture: WNL   Lower Extremity Exam    Side: Right lower extremity  Side: Left lower extremity  Stability: No instability observed          Stability: No instability observed          Skin & Extremity Inspection: Skin color, temperature, and hair growth are WNL. No peripheral edema or cyanosis. No masses, redness, swelling, asymmetry, or associated skin lesions. No contractures.  Skin & Extremity Inspection: Skin color, temperature, and hair growth are WNL. No peripheral edema or cyanosis. No masses, redness, swelling, asymmetry, or associated skin lesions. No contractures.  Functional ROM: Decreased ROM for knee joint          Functional ROM: Decreased ROM for knee joint          Muscle Tone/Strength: Functionally intact. No obvious neuro-muscular anomalies detected.  Muscle Tone/Strength: Functionally intact. No obvious neuro-muscular anomalies detected.  Sensory (Neurological): Arthropathic arthralgia        Sensory (Neurological): Arthropathic arthralgia        DTR: Patellar: deferred today Achilles: deferred today Plantar: deferred today  DTR: Patellar: deferred today Achilles: deferred today Plantar: deferred today  Palpation: No palpable anomalies  Palpation: No palpable anomalies   Assessment  Primary Diagnosis & Pertinent Problem List: The primary encounter diagnosis was DDD (degenerative disc disease), lumbar. Diagnoses of Primary osteoarthritis of both knees, Lumbar spondylosis, History of bilateral knee replacement, Chronic bilateral low back pain without sciatica, and Chronic pain syndrome were also pertinent to this visit.  Status Diagnosis  Controlled Controlled Controlled 1. DDD (degenerative disc disease), lumbar   2. Primary osteoarthritis of both knees   3. Lumbar spondylosis   4. History of bilateral knee replacement   5.  Chronic bilateral low back pain without sciatica   6. Chronic pain syndrome      General Recommendations: The pain condition that the patient suffers from is best treated with a multidisciplinary approach that involves an increase in physical activity to prevent de-conditioning and worsening of the pain cycle, as well as psychological counseling (formal and/or informal) to address the co-morbid psychological affects of pain. Treatment will often involve judicious use of pain medications and interventional procedures to decrease the pain, allowing the patient to participate in the physical activity that will ultimately produce long-lasting pain reductions. The goal of the multidisciplinary approach is to return the patient to a higher level of overall function and to restore their ability to perform activities of daily living.  67 year old male who presents with a chief complaint of axial low back pain, right hip pain, bilateral knee pain. 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 We discussed treatment options for this which would include lumbar medial branch nerve block at L3, L4, L5 bilaterally and/or bilateral SI joint injection. Patient would like to hold off on these procedures for the  time being.  Patient returns today for medication refill.Overall patient is doing well.  Patient is continuing to go to the gym.  He states that his cardiologist changed 1 of his blood pressure medications to improve his swelling which he has.  Otherwise we will repeat a UDS today and refill his medications as below.  Plan: -UDS -Refill hydrocodone at current dose of 10 mg 3 times daily as needed. For 2 months.No further dose escalation beyond this  -Continue gabapentin 300 mg TID  Future considerations: Lumbar facet medial branch nerve blocks at L3, L4,  L5, bilateral SI joint injections, bilateral genicular nerve block  Plan of Care  Pharmacotherapy (Medications Ordered): Meds ordered this encounter  Medications  . HYDROcodone-acetaminophen (NORCO) 10-325 MG tablet    Sig: Take 1 tablet by mouth 3 (three) times daily as needed for severe pain.    Dispense:  90 tablet    Refill:  0    Do not place this medication, or any other prescription from our practice, on "Automatic Refill". Patient may have prescription filled one day early if pharmacy is closed on scheduled refill date.  [name] HYDROcodone-acetaminophen (NORCO) 10-325 MG tablet    Sig: Take 1 tablet by mouth every 8 (eight) hours as needed for severe pain.    Dispense:  90 tablet    Refill:  0    Do not place this medication, or any other prescription from our practice, on "Automatic Refill". Patient may have prescription filled one day early if pharmacy is closed on scheduled refill date.   Lab-work, procedure(s), and/or referral(s): Orders Placed This Encounter  Procedures  . ToxASSURE Select 13 (MW), Urine   Provider-requested follow-up: Return in about 10 weeks (around 05/07/2018) for Medication Management.  Future Appointments  Date Time Provider [place]  03/12/2018  9:00 AM Rise Mu, PA-C [facility]  03/19/2018  7:40 AM Hubbard Hartshorn, FNP [facility]  04/23/2018  9:30 AM Gillis Santa, MD [facility] None    Primary Care Physician: Hubbard Hartshorn, FNP Location: [facility] Note by: Gillis Santa, M.D Date: 02/26/2018; Time: 11:25 AM  Patient Instructions  Hydrocodone with acetaminophen to last until 05/08/2018 has been escribed to your pharmacy.

## 2018-02-26 NOTE — Telephone Encounter (Signed)
Pt calling for HTN. Saw Dr. [name] last week and was asked to begin taking daily blood pressures. Pt denies sx, he reports that swelling has decreased. He reports that he is following recommendations to follow low sodium diet and is talking all medications as prescribed.   Per last visit with Dr End  "ASSESSMENT AND PLAN: Lower extremity edema Present for at least 6 months and likely multifactorial.  Given a dependent component, there is likely an element of venous insufficiency.  Other than dyspnea when bending over, Mr. Kerstetter does not have other heart failure symptoms.  I suspect that he has underlying sleep apnea, which could be exacerbating his edema.  High-dose amlodipine may also be contributing.  We have agreed to obtain a transthoracic echocardiogram and decrease amlodipine to 5 mg daily.  I will add spironolactone 25 mg daily today.  I have encouraged Mr. Guitron to proceed with sleep study, as previously ordered by his PCP.  Hypertension BP not well-controlled.  Given history of hypokalemia and hypernatremia, I will check a serum aldosterone and plasma renin activity panel as well as start empiric spironolactone 25 mg daily.  We will obtain a BMP today and in ~1 week (BP check at that time as well).  Mr. Siwek is to proceed with sleep study as well.  Sodium restriction was encouraged.  Follow-up: Return to clinic in ~1 month."  Told pt I will forward this message to provider for further recommendations.

## 2018-02-26 NOTE — Progress Notes (Signed)
Nursing Pain Medication Assessment:  Safety precautions to be maintained throughout the outpatient stay will include: orient to surroundings, keep bed in low position, maintain call bell within reach at all times, provide assistance with transfer out of bed and ambulation.  Medication Inspection Compliance: Pill count conducted under aseptic conditions, in front of the patient. Neither the pills nor the bottle was removed from the patient's sight at any time. Once count was completed pills were immediately returned to the patient in their original bottle.  Medication: Hydrocodone/APAP Pill/Patch Count: 42 of 90 pills remain Pill/Patch Appearance: Markings consistent with prescribed medication Bottle Appearance: Standard pharmacy container. Clearly labeled. Filled Date: 32 / 8 / 2019 Last Medication intake:  Yesterday

## 2018-02-26 NOTE — Telephone Encounter (Signed)
Please let [name] know that his BP's remain suboptimally controlled.  I recommend increasing spironolactone to 50 mg daily with repeat BMP early next week.

## 2018-02-26 NOTE — Telephone Encounter (Signed)
Pt c/o BP issue: STAT if pt c/o blurred vision, one-sided weakness or slurred speech  1. What are your last 5 BP readings?     11/19  136/96     11/20  157/89    11/21  167/84   11/22  170/90   11/23  174/94   11/24  158/84     11/25  162/102    11/26  161/74   2. Are you having any other symptoms (ex. Dizziness, headache, blurred vision, passed out)? No patient told to track   3. What is your BP issue? Reporting back per advise

## 2018-02-26 NOTE — Patient Instructions (Signed)
Hydrocodone with acetaminophen to last until 05/08/2018 has been escribed to your pharmacy.

## 2018-02-27 MED ORDER — SPIRONOLACTONE 25 MG PO TABS: 50.0000 mg | ORAL_TABLET | Freq: Every day | ORAL | 1 refills | Status: DC

## 2018-02-27 NOTE — Telephone Encounter (Signed)
Call from patient regarding asymptomatic htn, recommendation from Dr. [name] included "increasing Spronolactone to 50 mg daily and repeat BMP early next week". Called patient back to relay information. Pt agreeable to plan of care.   Medication change sent to patient pharmacy. Pt will continue to take daily blood pressures and [facility] on Monday or Tuesday.   Order was placed. Pt agreeable to continue low salt diet. Advised pt to call for any further questions or concerns

## 2018-03-04 ENCOUNTER — Other Ambulatory Visit
Admission: RE | Admit: 2018-03-04 | Discharge: 2018-03-04 | Disposition: A | Discharge status: Home / Self Care | Payer: Medicare PPO | Source: Ambulatory Visit | Attending: Internal Medicine | Admitting: Internal Medicine

## 2018-03-04 DIAGNOSIS — I10 Essential (primary) hypertension: Secondary | ICD-10-CM | POA: Insufficient documentation

## 2018-03-04 DIAGNOSIS — R600 Localized edema: Secondary | ICD-10-CM | POA: Insufficient documentation

## 2018-03-04 LAB — BASIC METABOLIC PANEL
Anion gap: 12 (ref 5–15)
BUN: 22 mg/dL (ref 8–23)
CALCIUM: 9.3 mg/dL (ref 8.9–10.3)
CO2: 26 mmol/L (ref 22–32)
CREATININE: 1.1 mg/dL (ref 0.61–1.24)
Chloride: 101 mmol/L (ref 98–111)
GFR calc Af Amer: >60 mL/min (ref >60)
GLUCOSE: 373 mg/dL — AB (ref 70–99)
Potassium: 3.9 mmol/L (ref 3.5–5.1)
Sodium: 139 mmol/L (ref 135–145)

## 2018-03-04 LAB — TOXASSURE SELECT 13 (MW), URINE

## 2018-03-05 ENCOUNTER — Telehealth: Payer: Self-pay | Admitting: *Deleted

## 2018-03-05 NOTE — Telephone Encounter (Signed)
-----   Message from Nelva Bush, MD sent at 03/04/2018  2:56 PM EST ----- Please let Evan Rivas know that his potassium has improved and his kidney function is stable.  Also, please inquire with him to see if his BP has improved with escalation of spironolactone.  Thanks.

## 2018-03-05 NOTE — Telephone Encounter (Signed)
Results called to pt. Pt verbalized understanding. Patient states he forgot to increase the spironolactone to 50 mg daily and has still only been taking 25 mg daily.  He will start the 50 mg daily today and continue to monitor BP this week and call us Friday with the readings.  He is scheduled to see Thurmond Butts on 03/12/18 and will keep that appointment.   Patient also says he has not heard from the referral to endocrinology. Number provided and he will call. Last I checked the information was on the endocrinologist desk for review and then they would call patient to schedule.

## 2018-03-10 NOTE — Progress Notes (Signed)
Cardiology Office Note Date:  03/12/2018  Patient ID:  Evan Rivas, Evan Rivas 09/11/1950, MRN [id number] PCP:  Hubbard Hartshorn, FNP  Cardiologist:  Dr. [name], MD    Chief Complaint: Follow up  History of Present Illness: TAHJAI Rivas is a 67 y.o. male with history of diastolic dysfunction, DM2, HTN, HLD, lower extremity edema felt to be mostly dependent, cancer of the lip, depression, anxiety, gout, arthritis, and chronic pain who presents for follow up of hypertension.  Patient was initially evaluated by Dr. [name] in 02/2018 as a new patient at the request of his PCP for lower extremity edema. At that time, he reported bilateral foot and ankle swelling over the prior year. He denied any medication changes or other possible changes that could have led to his symptoms. His weight was noted to have been stable. He noted frequent fatigue, particularly in the afternoon with frequent naps in the mid and late afternoon. He noted his lower extremity swelling improved with elevation and compression stockings. He noted some dyspnea with bending over. At that visit, his BP was noted to be 170/80 with a weight of 280 pounds. There was some concern he had dependent edema that was exacerbated by his high-dose amlodipine. Underlying, undiagnosed sleep apnea could not be excluded. His amlodipine was decreased to 5 mg daily and he was started on spironolactone 25 mg daily. He was encouraged to proceed with the sleep study as previously ordered by his PCP. Echo on 02/19/2018 showed an EF of 60-65%, mild LVH, Gr2DD, mild MR, mildly dilated left atrium, RV cavity size was normal with normal wall thickness and systolic function. Labs showed possible underlying hyperaldosteronism with an elevated aldosterone to renal activity ratio. He was started on spironolactone at his visit as above. He was referred to endocrinology. His BP remained suboptimally controlled and his spironolactone was increased to 50 mg daily. Follow  up BMP showed stable potassium and SCr. Unfortunately, the patient never increased his spironolactone to 50 mg daily when these labs were checked. He was advised on the phone on 12/3 to increase his spironolactone to 50 mg daily.   He comes in doing well today.  Blood pressure at home has ranged from the 160s to upper 086V systolic with a heart rate in the mid 60s to 70s bpm.  He has been using a wrist cuff and is uncertain of its accuracy.  No chest pain or shortness of breath.  Lower extremity swelling has significantly improved since decreasing his amlodipine to 5 mg daily.  Unfortunately, his amlodipine 10 mg is not scored and he has been having a difficult time and cutting this pill in half.  He requests a new prescription for 5 mg.  He has his appointment with endocrinology on 04/05/2018.  No prior renal artery ultrasound.  He has been compliant with amlodipine 5 mg daily, HCTZ 25 mg daily, losartan 100 mg daily, Toprol-XL 125 mg daily, and spironolactone 50 mg daily.  He plans to get his PCP to reschedule his sleep study.  Echo results were reviewed in detail with him today.  Past Medical History:  Diagnosis Date  . Anxiety    panic attacks  . Arthritis   . Cancer (HCC)    lip  . Depression   . Diabetes mellitus without complication ([place])    type 2  . GERD (gastroesophageal reflux disease)   . Gout   . History of kidney stones   . Hyperlipidemia   . Hypertension   .  Pneumonia    2014 and 2018 ( 2018-klebsiella pneumonia     Past Surgical History:  Procedure Laterality Date  . ARTHROSCOPIC REPAIR ACL Left   . CYSTOSCOPY W/ URETERAL STENT PLACEMENT Right 09/07/2014   Procedure: CYSTOSCOPY WITH STENT REPLACEMENT;  Surgeon: Hollice Espy, MD;  Location: [facility];  Service: Urology;  Laterality: Right;  . CYSTOSCOPY/URETEROSCOPY/HOLMIUM LASER/STENT PLACEMENT Right 08/17/2014   Procedure: CYSTOSCOPY/URETEROSCOPY//STENT PLACEMENT/ attempt of lithotripsy;  Surgeon: Hollice Espy, MD;   Location: [facility];  Service: Urology;  Laterality: Right;  . HERNIA REPAIR    . MOHS SURGERY     lip  . TOTAL KNEE ARTHROPLASTY Left 04/24/2016   Procedure: LEFT TOTAL KNEE ARTHROPLASTY;  Surgeon: Gaynelle Arabian, MD;  Location: [facility];  Service: Orthopedics;  Laterality: Left;  Adductor Block  . TOTAL KNEE ARTHROPLASTY Right 10/23/2016   Procedure: RIGHT TOTAL KNEE ARTHROPLASTY;  Surgeon: Gaynelle Arabian, MD;  Location: [facility];  Service: Orthopedics;  Laterality: Right;  . URETEROSCOPY WITH HOLMIUM LASER LITHOTRIPSY Right 09/07/2014   Procedure: URETEROSCOPY WITH HOLMIUM LASER LITHOTRIPSY;  Surgeon: Hollice Espy, MD;  Location: [facility];  Service: Urology;  Laterality: Right;  [name] VASECTOMY      Current Meds  Medication Sig  . allopurinol (ZYLOPRIM) 300 MG tablet Take 1 tablet (300 mg total) by mouth every evening.  . ASPIRIN 81 PO Take 81 mg by mouth daily.   [name] atorvastatin (LIPITOR) 40 MG tablet Take 1 tablet (40 mg total) by mouth at bedtime.  [name] FLUoxetine (PROZAC) 40 MG capsule Take 1 capsule (40 mg total) by mouth daily.  . fluticasone (FLONASE) 50 MCG/ACT nasal spray Place 2 sprays into both nostrils daily. 2 sprays  . gabapentin (NEURONTIN) 300 MG capsule Take 1 capsule (300 mg total) by mouth 3 (three) times daily.  [name] glipiZIDE (GLUCOTROL) 10 MG tablet Take 1 tablet (10 mg total) by mouth daily before breakfast.  . glipiZIDE (GLUCOTROL) 5 MG tablet Take 1 tablet (5 mg total) by mouth every evening. With dinner  . glucose blood (ACCU-CHEK AVIVA PLUS) test strip Use as instructed  . hydrochlorothiazide (HYDRODIURIL) 25 MG tablet Take 1 tablet (25 mg total) by mouth daily.  [name] HYDROcodone-acetaminophen (NORCO) 10-325 MG tablet Take 1 tablet by mouth 3 (three) times daily as needed for severe pain.  . hydrOXYzine (ATARAX/VISTARIL) 10 MG tablet Take 1 tablet (10 mg total) by mouth every 8 (eight) hours as needed for anxiety.  [name] losartan (COZAAR) 100 MG tablet Take 1 tablet (100 mg total) by  mouth at bedtime.  . metFORMIN (GLUCOPHAGE XR) 750 MG 24 hr tablet Take 2 tablets (1,500 mg total) by mouth daily with breakfast.  . methocarbamol (ROBAXIN) 500 MG tablet Take 1 tablet (500 mg total) by mouth every 6 (six) hours as needed for muscle spasms.  . metoprolol succinate (TOPROL-XL) 100 MG 24 hr tablet Take 1 tablet (100 mg total) by mouth every morning.  . metoprolol succinate (TOPROL-XL) 25 MG 24 hr tablet Take 1 tablet (25 mg total) by mouth every morning.  . nabumetone (RELAFEN) 500 MG tablet Take 1 tablet (500 mg total) by mouth 2 (two) times daily.  [name] spironolactone (ALDACTONE) 25 MG tablet Take 2 tablets (50 mg total) by mouth daily.  . tamsulosin (FLOMAX) 0.4 MG CAPS capsule Take 1 capsule (0.4 mg total) by mouth daily after supper.  [name] tiZANidine (ZANAFLEX) 4 MG tablet Take 1 tablet (4 mg total) by mouth at bedtime.  . [DISCONTINUED] amLODipine (NORVASC) 5 MG tablet  Take 1 tablet (5 mg total) by mouth daily. (Patient taking differently: Take 2.5 mg by mouth daily. )    Allergies:   Celebrex  [celecoxib]; Tetanus toxoids; and Tetanus-diphtheria toxoids td   Social History:  The patient  reports that he has never smoked. He has never used smokeless tobacco. He reports that he does not drink alcohol or use drugs.   Family History:  The patient's family history includes Cancer in his mother; Depression in his sister; Heart disease in his father; Hyperlipidemia in his mother and sister; Hypertension in his mother and sister; Stroke in his father.  ROS:   Review of Systems  Constitutional: Positive for malaise/fatigue. Negative for chills, diaphoresis, fever and weight loss.  HENT: Negative for congestion.   Eyes: Negative for discharge and redness.  Respiratory: Negative for cough, hemoptysis, sputum production, shortness of breath and wheezing.   Cardiovascular: Positive for leg swelling. Negative for chest pain, palpitations, orthopnea, claudication and PND.  Gastrointestinal:  Negative for abdominal pain, blood in stool, heartburn, melena, nausea and vomiting.  Genitourinary: Negative for hematuria.  Musculoskeletal: Negative for falls and myalgias.  Skin: Negative for rash.  Neurological: Negative for dizziness, tingling, tremors, sensory change, speech change, focal weakness, loss of consciousness and weakness.  Endo/Heme/Allergies: Does not bruise/bleed easily.  Psychiatric/Behavioral: Negative for substance abuse. The patient is not nervous/anxious.   All other systems reviewed and are negative.    PHYSICAL EXAM:  VS:  BP (!) 150/88 (BP Location: Left Arm, Patient Position: Sitting, Cuff Size: Normal)   Pulse 70   Ht 6\' 3"  (1.905 m)   Wt 270 lb (122.5 kg)   BMI 33.75 kg/m  BMI: Body mass index is 33.75 kg/m.  Physical Exam  Constitutional: He is oriented to person, place, and time. He appears well-developed and well-nourished.  HENT:  Head: Normocephalic and atraumatic.  Eyes: Right eye exhibits no discharge. Left eye exhibits no discharge.  Neck: Normal range of motion. No JVD present.  Cardiovascular: Normal rate, regular rhythm, S1 normal, S2 normal and normal heart sounds. Exam reveals no distant heart sounds, no friction rub, no midsystolic click and no opening snap.  No murmur heard. Pulses:      Posterior tibial pulses are 2+ on the right side, and 2+ on the left side.  Bilateral compression stockings noted  Pulmonary/Chest: Effort normal and breath sounds normal. No respiratory distress. He has no decreased breath sounds. He has no wheezes. He has no rales. He exhibits no tenderness.  Abdominal: Soft. He exhibits no distension. There is no tenderness.  Musculoskeletal: He exhibits edema.  Trace bilateral lower extremity edema  Neurological: He is alert and oriented to person, place, and time.  Skin: Skin is warm and dry. No cyanosis. Nails show no clubbing.  Psychiatric: He has a normal mood and affect. His speech is normal and behavior is  normal. Judgment and thought content normal.      EKG:  Was not ordered today.  Recent Labs: 04/24/2017: BNP 65.0; Hemoglobin 14.4; Platelets 216 09/17/2017: ALT 16 03/04/2018: BUN 22; Creatinine, Ser 1.10; Potassium 3.9; Sodium 139  09/17/2017: Cholesterol, Total 140; HDL 32; LDL Calculated 80; Triglycerides 141   Estimated Creatinine Clearance: 91.9 mL/min (by C-G formula based on SCr of 1.1 mg/dL).   Wt Readings from Last 3 Encounters:  03/12/18 270 lb (122.5 kg)  02/26/18 280 lb (127 kg)  02/08/18 280 lb 8 oz (127.2 kg)     Other studies reviewed: Additional studies/records reviewed today  include: summarized above  ASSESSMENT AND PLAN:  1. Lower extremity edema: Significantly improved since decreasing amlodipine.  Likely exacerbated by diastolic dysfunction with suboptimal blood pressure control and untreated sleep apnea.  Continue compression stockings.  Elevate legs when sitting.  Given his suboptimally controlled blood pressure we will continue with current dose of amlodipine at 5 mg daily for now.  If blood pressure allows moving forward, would consider discontinuing calcium channel blocker.  2. HTN: Remains suboptimally controlled.  I have advised the patient to pick up an Omron brachial blood pressure cuff rather than using his wrist cuff.  We will increase his metoprolol XL to 150 mg daily.  He will continue amlodipine 5 mg daily, HCTZ 25 mg daily, losartan 100 mg daily, and spironolactone 50 mg daily.  He will follow-up with endocrinology for evaluation of possible hyperaldosteronism.  We will schedule a renal artery ultrasound to evaluate for renal artery stenosis.  Check a BMP today.  3. Diastolic dysfunction: Weight is down 10 pounds from his last office visit.  He attributes this to eating healthier and much less salt.  Remains on HCTZ as above.  4. Sleep disordered breathing: Patient plans to get his PCP to reschedule his sleep study.  Disposition: F/u with Dr. [name] or an  APP in 3 months.  Current medicines are reviewed at length with the patient today.  The patient did not have any concerns regarding medicines.  Signed, Christell Faith, PA-C 03/12/2018 9:33 AM     [facility] [address] [place] [address] [place], [place] [address] [phone]

## 2018-03-11 NOTE — Telephone Encounter (Signed)
Pt c/o BP issue: STAT if pt c/o blurred vision, one-sided weakness or slurred speech  1. What are your last 5 BP readings?      12/2   188/95  66        12/3  183/95  66     12/4  161/89  69     12/5  163/89  67      12/6  166/89  66       12/7  Forgot         12/8  176/98  70     12/9  171/95  71  2. Are you having any other symptoms (ex. Dizziness, headache, blurred vision, passed out)? Advised by Bozza Malta RN to call in with readings   3. What is your BP issue?  For tomorrows appt review

## 2018-03-11 NOTE — Telephone Encounter (Signed)
Routing to [place] to review. Appt tomorrow.

## 2018-03-12 ENCOUNTER — Encounter: Payer: Self-pay | Admitting: Physician Assistant

## 2018-03-12 ENCOUNTER — Ambulatory Visit: Payer: Medicare PPO | Admitting: Physician Assistant

## 2018-03-12 ENCOUNTER — Telehealth: Payer: Self-pay | Admitting: Family Medicine

## 2018-03-12 VITALS — BP 150/88 | HR 70 | Ht 75.0 in | Wt 270.0 lb

## 2018-03-12 DIAGNOSIS — R0600 Dyspnea, unspecified: Secondary | ICD-10-CM

## 2018-03-12 DIAGNOSIS — R600 Localized edema: Secondary | ICD-10-CM

## 2018-03-12 DIAGNOSIS — I10 Essential (primary) hypertension: Secondary | ICD-10-CM | POA: Diagnosis not present

## 2018-03-12 DIAGNOSIS — E6609 Other obesity due to excess calories: Secondary | ICD-10-CM

## 2018-03-12 DIAGNOSIS — I5189 Other ill-defined heart diseases: Secondary | ICD-10-CM | POA: Diagnosis not present

## 2018-03-12 DIAGNOSIS — Z6833 Body mass index (BMI) 33.0-33.9, adult: Principal | ICD-10-CM

## 2018-03-12 DIAGNOSIS — G4730 Sleep apnea, unspecified: Secondary | ICD-10-CM

## 2018-03-12 DIAGNOSIS — G4719 Other hypersomnia: Secondary | ICD-10-CM

## 2018-03-12 DIAGNOSIS — Z79899 Other long term (current) drug therapy: Secondary | ICD-10-CM

## 2018-03-12 MED ORDER — METOPROLOL SUCCINATE ER 25 MG PO TB24: 50.0000 mg | ORAL_TABLET | ORAL | 3 refills | Status: DC

## 2018-03-12 MED ORDER — AMLODIPINE BESYLATE 5 MG PO TABS: 5.0000 mg | ORAL_TABLET | Freq: Every day | ORAL | 3 refills | Status: DC

## 2018-03-12 MED ORDER — SPIRONOLACTONE 25 MG PO TABS: 50.0000 mg | ORAL_TABLET | Freq: Every day | ORAL | 3 refills | Status: DC

## 2018-03-12 NOTE — Telephone Encounter (Signed)
Please call Evan Rivas and let him know that I saw his note from cardiology today and he needs a sleep study. I referred him to Pulmonology for this.

## 2018-03-12 NOTE — Patient Instructions (Addendum)
Medication Instructions:  Your physician has recommended you make the following change in your medication:  1- INCREASE Metoprolol to 150 mg by mouth once a day.  If you need a refill on your cardiac medications before your next appointment, please call your pharmacy.   Lab work: Your physician recommends that you return for lab work in: [facility].   If you have labs (blood work) drawn today and your tests are completely normal, you will receive your results only by: [name] MyChart Message (if you have MyChart) OR . A paper copy in the mail If you have any lab test that is abnormal or we need to change your treatment, we will call you to review the results.  Testing/Procedures: Your physician has requested that you have a renal artery duplex. During this test, an ultrasound is used to evaluate blood flow to the kidneys. Allow one hour for this exam. Do not eat after midnight the day before and avoid carbonated beverages. Take your medications as you usually do.    Follow-Up: At [facility], you and your health needs are our priority.  As part of our continuing mission to provide you with exceptional heart care, we have created designated Provider Care Teams.  These Care Teams include your primary Cardiologist (physician) and Advanced Practice Providers (APPs -  Physician Assistants and Nurse Practitioners) who all work together to provide you with the care you need, when you need it. You will need a follow up appointment in 3 months.  Please call our office 2 months in advance to schedule this appointment.  You may see DR Harrell Gave END or one of the following Advanced Practice Providers on your designated Care Team:   Murray Hodgkins, NP Christell Faith, PA-C . Marrianne Mood, PA-C

## 2018-03-12 NOTE — Telephone Encounter (Signed)
Patient notified

## 2018-03-13 LAB — BASIC METABOLIC PANEL
BUN/Creatinine Ratio: 22 (ref 10–24)
BUN: 28 mg/dL — ABNORMAL HIGH (ref 8–27)
CO2: 22 mmol/L (ref 20–29)
Calcium: 9.5 mg/dL (ref 8.6–10.2)
Chloride: 102 mmol/L (ref 96–106)
Creatinine, Ser: 1.25 mg/dL (ref 0.76–1.27)
GFR calc Af Amer: 68 mL/min/{1.73_m2} (ref >59)
GFR, EST NON AFRICAN AMERICAN: 59 mL/min/{1.73_m2} — AB (ref >59)
Glucose: 202 mg/dL — ABNORMAL HIGH (ref 65–99)
Potassium: 4 mmol/L (ref 3.5–5.2)
Sodium: 144 mmol/L (ref 134–144)

## 2018-03-13 NOTE — Telephone Encounter (Signed)
[facility] to make sure patient's appointment for Endocrinology was scheduled. He is scheduled for April 05, 2018 at 10:30 am. Closing encounter.

## 2018-03-19 ENCOUNTER — Ambulatory Visit (INDEPENDENT_AMBULATORY_CARE_PROVIDER_SITE_OTHER): Payer: Medicare PPO | Admitting: Internal Medicine

## 2018-03-19 ENCOUNTER — Encounter: Payer: Self-pay | Admitting: Internal Medicine

## 2018-03-19 ENCOUNTER — Ambulatory Visit: Payer: Medicare PPO | Admitting: Family Medicine

## 2018-03-19 VITALS — BP 160/88 | HR 85 | Resp 16 | Ht 75.0 in | Wt 272.0 lb

## 2018-03-19 DIAGNOSIS — G4719 Other hypersomnia: Secondary | ICD-10-CM

## 2018-03-19 NOTE — Patient Instructions (Signed)

## 2018-03-19 NOTE — Progress Notes (Signed)
[name]      Assessment and Plan:  Excessive daytime sleepiness. - Symptoms and signs of obstructive sleep apnea, will send for sleep study. - Patient is on several medications which could contribute to daytime sleepiness, he also has a poor sleep hygiene habits which we discussed today.  If sleep study normal, or if his daytime sleepiness symptoms do not improve with CPAP, suspect his daytime sleepiness would likely be due to 1 or more of the above issues.  Depression, diabetes mellitus, essential hypertension. - Sleep apnea can contribute to above conditions, therefore treatment of sleep apnea is an important part of their management.  Orders Placed This Encounter  Procedures  . Home sleep test   Return in about 3 months (around 06/18/2018).    Date: 03/19/2018  MRN# [id number] Evan Rivas [birth year]    Evan Rivas is a 67 y.o. old male seen in consultation for chief complaint of:    Chief Complaint  Patient presents with  . Consult    Referred by [name] for eval of OSA:  . Snoring  . excessive daytime fatigue    HPI:  The patient is a 67 year old male referred for symptoms of excessive daytime sleepiness.  He typically goes to bed around 3 AM, sleeps until 9:30 AM.  His Epworth score is elevated at 13 today.  Review of medications shows his medication list includes medications which could contribute to daytime sleepiness including Prozac, Norco tid, Atarax not daily, Robaxin (not on), Toprol daily, Relafen bid, Zanaflex at night twice per week.  He has been noticing that he has been tired more over the last 3 years, and this has progressed significantly over the past 4 months. He often dozes off and will be "completely out". This usually happens when he is sitting, he is not working. It often happens in the afternoon, when he runs out of energy.  He does not go to bed until 3 am, he watches sports until late and will be  awake during that time. He will then turn off the tv at 3 am and go to bed. Gets out of bed around 9:30 am. When wakes in am feels tired. He has never dozed off while driving.    PMHX:   Past Medical History:  Diagnosis Date  . Anxiety    panic attacks  . Arthritis   . Cancer (HCC)    lip  . Depression   . Diabetes mellitus without complication ([place])    type 2  . GERD (gastroesophageal reflux disease)   . Gout   . History of kidney stones   . Hyperlipidemia   . Hypertension   . Pneumonia    2014 and 2018 ( 2018-klebsiella pneumonia    Surgical Hx:  Past Surgical History:  Procedure Laterality Date  . ARTHROSCOPIC REPAIR ACL Left   . CYSTOSCOPY W/ URETERAL STENT PLACEMENT Right 09/07/2014   Procedure: CYSTOSCOPY WITH STENT REPLACEMENT;  Surgeon: Hollice Espy, MD;  Location: [facility];  Service: Urology;  Laterality: Right;  . CYSTOSCOPY/URETEROSCOPY/HOLMIUM LASER/STENT PLACEMENT Right 08/17/2014   Procedure: CYSTOSCOPY/URETEROSCOPY//STENT PLACEMENT/ attempt of lithotripsy;  Surgeon: Hollice Espy, MD;  Location: [facility];  Service: Urology;  Laterality: Right;  . HERNIA REPAIR    . MOHS SURGERY     lip  . TOTAL KNEE ARTHROPLASTY Left 04/24/2016   Procedure: LEFT TOTAL KNEE ARTHROPLASTY;  Surgeon: Gaynelle Arabian, MD;  Location: [facility];  Service: Orthopedics;  Laterality: Left;  Adductor Block  .  TOTAL KNEE ARTHROPLASTY Right 10/23/2016   Procedure: RIGHT TOTAL KNEE ARTHROPLASTY;  Surgeon: Gaynelle Arabian, MD;  Location: [facility];  Service: Orthopedics;  Laterality: Right;  . URETEROSCOPY WITH HOLMIUM LASER LITHOTRIPSY Right 09/07/2014   Procedure: URETEROSCOPY WITH HOLMIUM LASER LITHOTRIPSY;  Surgeon: Hollice Espy, MD;  Location: [facility];  Service: Urology;  Laterality: Right;  [name] VASECTOMY     Family Hx:  Family History  Problem Relation Age of Onset  . Hypertension Mother   . Cancer Mother   . Hyperlipidemia Mother   . Stroke Father   . Heart disease Father   . Depression  Sister   . Hypertension Sister   . Hyperlipidemia Sister    Social Hx:   Social History   Tobacco Use  . Smoking status: Never Smoker  . Smokeless tobacco: Never Used  Substance Use Topics  . Alcohol use: No  . Drug use: No   Medication:    Current Outpatient Medications:  .  allopurinol (ZYLOPRIM) 300 MG tablet, Take 1 tablet (300 mg total) by mouth every evening., Disp: 90 tablet, Rfl: 1 .  amLODipine (NORVASC) 5 MG tablet, Take 1 tablet (5 mg total) by mouth daily., Disp: 90 tablet, Rfl: 3 .  ASPIRIN 81 PO, Take 81 mg by mouth daily. , Disp: , Rfl:  .  atorvastatin (LIPITOR) 40 MG tablet, Take 1 tablet (40 mg total) by mouth at bedtime., Disp: 90 tablet, Rfl: 1 .  FLUoxetine (PROZAC) 40 MG capsule, Take 1 capsule (40 mg total) by mouth daily., Disp: 90 capsule, Rfl: 1 .  fluticasone (FLONASE) 50 MCG/ACT nasal spray, Place 2 sprays into both nostrils daily. 2 sprays, Disp: 16 g, Rfl: 1 .  gabapentin (NEURONTIN) 300 MG capsule, Take 1 capsule (300 mg total) by mouth 3 (three) times daily., Disp: 90 capsule, Rfl: 4 .  glipiZIDE (GLUCOTROL) 10 MG tablet, Take 1 tablet (10 mg total) by mouth daily before breakfast., Disp: 90 tablet, Rfl: 1 .  glipiZIDE (GLUCOTROL) 5 MG tablet, Take 1 tablet (5 mg total) by mouth every evening. With dinner, Disp: 90 tablet, Rfl: 1 .  glucose blood (ACCU-CHEK AVIVA PLUS) test strip, Use as instructed, Disp: 100 each, Rfl: 12 .  hydrochlorothiazide (HYDRODIURIL) 25 MG tablet, Take 1 tablet (25 mg total) by mouth daily., Disp: 90 tablet, Rfl: 1 .  HYDROcodone-acetaminophen (NORCO) 10-325 MG tablet, Take 1 tablet by mouth 3 (three) times daily as needed for severe pain., Disp: 90 tablet, Rfl: 0 .  hydrOXYzine (ATARAX/VISTARIL) 10 MG tablet, Take 1 tablet (10 mg total) by mouth every 8 (eight) hours as needed for anxiety., Disp: 20 tablet, Rfl: 0 .  losartan (COZAAR) 100 MG tablet, Take 1 tablet (100 mg total) by mouth at bedtime., Disp: 90 tablet, Rfl: 1 .   metFORMIN (GLUCOPHAGE XR) 750 MG 24 hr tablet, Take 2 tablets (1,500 mg total) by mouth daily with breakfast., Disp: 180 tablet, Rfl: 1 .  methocarbamol (ROBAXIN) 500 MG tablet, Take 1 tablet (500 mg total) by mouth every 6 (six) hours as needed for muscle spasms., Disp: 80 tablet, Rfl: 0 .  metoprolol succinate (TOPROL-XL) 100 MG 24 hr tablet, Take 1 tablet (100 mg total) by mouth every morning., Disp: 90 tablet, Rfl: 0 .  metoprolol succinate (TOPROL-XL) 25 MG 24 hr tablet, Take 2 tablets (50 mg total) by mouth every morning., Disp: 180 tablet, Rfl: 3 .  nabumetone (RELAFEN) 500 MG tablet, Take 1 tablet (500 mg total) by mouth 2 (two) times  daily., Disp: 180 tablet, Rfl: 0 .  spironolactone (ALDACTONE) 25 MG tablet, Take 2 tablets (50 mg total) by mouth daily., Disp: 90 tablet, Rfl: 3 .  tamsulosin (FLOMAX) 0.4 MG CAPS capsule, Take 1 capsule (0.4 mg total) by mouth daily after supper., Disp: 90 capsule, Rfl: 0 .  tiZANidine (ZANAFLEX) 4 MG tablet, Take 1 tablet (4 mg total) by mouth at bedtime., Disp: 90 tablet, Rfl: 0   Allergies:  Celebrex  [celecoxib]; Tetanus toxoids; and Tetanus-diphtheria toxoids td  Review of Systems: Gen:  Denies  fever, sweats, chills HEENT: Denies blurred vision, double vision. bleeds, sore throat Cvc:  No dizziness, chest pain. Resp:   Denies cough or sputum production, shortness of breath Gi: Denies swallowing difficulty, stomach pain. Gu:  Denies bladder incontinence, burning urine Ext:   No Joint pain, stiffness. Skin: No skin rash,  hives  Endoc:  No polyuria, polydipsia. Psych: No depression, insomnia. Other:  All other systems were reviewed with the patient and were negative other that what is mentioned in the HPI.   Physical Examination:   VS: BP (!) 160/88 (BP Location: Left Arm, Cuff Size: Large)   Pulse 85   Resp 16   Ht 6\' 3"  (1.905 m)   Wt 272 lb (123.4 kg)   SpO2 97%   BMI 34.00 kg/m   General Appearance: No distress  Neuro:without  focal findings,  speech normal,  HEENT: PERRLA, EOM intact.   Pulmonary: normal breath sounds, No wheezing.  CardiovascularNormal S1,S2.  No m/r/g.   Abdomen: Benign, Soft, non-tender. Renal:  No costovertebral tenderness  GU:  No performed at this time. Endoc: No evident thyromegaly, no signs of acromegaly. Skin:   warm, no rashes, no ecchymosis  Extremities: normal, no cyanosis, clubbing.  Other findings:    LABORATORY PANEL:   CBC No results for input(s): WBC, HGB, HCT, PLT in the last 168 hours. ------------------------------------------------------------------------------------------------------------------  Chemistries  No results for input(s): NA, K, CL, CO2, GLUCOSE, BUN, CREATININE, CALCIUM, MG, AST, ALT, ALKPHOS, BILITOT in the last 168 hours.  Invalid input(s): GFRCGP ------------------------------------------------------------------------------------------------------------------  Cardiac Enzymes No results for input(s): TROPONINI in the last 168 hours. ------------------------------------------------------------  RADIOLOGY:  No results found.     Thank  you for the consultation and for allowing [facility] to assist in the care of your patient. Our recommendations are noted above.  Please contact us if we can be of further service.   Marda Stalker, M.D., F.C.C.P.  Board Certified in Internal Medicine, Pulmonary Medicine, [place], and Sleep Medicine.  [facility] Office Number: [phone]   03/19/2018

## 2018-03-25 ENCOUNTER — Other Ambulatory Visit: Payer: Self-pay | Admitting: Family Medicine

## 2018-03-25 ENCOUNTER — Ambulatory Visit: Payer: Medicare PPO | Admitting: Family Medicine

## 2018-03-25 ENCOUNTER — Encounter: Payer: Self-pay | Admitting: Family Medicine

## 2018-03-25 VITALS — BP 128/78 | HR 83 | Temp 98.2°F | Ht 75.0 in | Wt 269.5 lb

## 2018-03-25 DIAGNOSIS — F331 Major depressive disorder, recurrent, moderate: Secondary | ICD-10-CM

## 2018-03-25 DIAGNOSIS — N400 Enlarged prostate without lower urinary tract symptoms: Secondary | ICD-10-CM

## 2018-03-25 DIAGNOSIS — E119 Type 2 diabetes mellitus without complications: Secondary | ICD-10-CM | POA: Diagnosis not present

## 2018-03-25 DIAGNOSIS — G8929 Other chronic pain: Secondary | ICD-10-CM

## 2018-03-25 DIAGNOSIS — F1120 Opioid dependence, uncomplicated: Secondary | ICD-10-CM

## 2018-03-25 DIAGNOSIS — E7800 Pure hypercholesterolemia, unspecified: Secondary | ICD-10-CM

## 2018-03-25 DIAGNOSIS — M1A4720 Other secondary chronic gout, left ankle and foot, without tophus (tophi): Secondary | ICD-10-CM

## 2018-03-25 DIAGNOSIS — M545 Low back pain, unspecified: Secondary | ICD-10-CM

## 2018-03-25 DIAGNOSIS — R600 Localized edema: Secondary | ICD-10-CM

## 2018-03-25 DIAGNOSIS — M2550 Pain in unspecified joint: Secondary | ICD-10-CM

## 2018-03-25 DIAGNOSIS — F419 Anxiety disorder, unspecified: Secondary | ICD-10-CM

## 2018-03-25 DIAGNOSIS — J302 Other seasonal allergic rhinitis: Secondary | ICD-10-CM

## 2018-03-25 DIAGNOSIS — I10 Essential (primary) hypertension: Secondary | ICD-10-CM

## 2018-03-25 LAB — POCT GLYCOSYLATED HEMOGLOBIN (HGB A1C)
HBA1C, POC (CONTROLLED DIABETIC RANGE): 8.9 % — AB (ref 0.0–7.0)
HbA1c POC (<> result, manual entry): 8.9 % (ref 4.0–5.6)
HbA1c, POC (prediabetic range): 8.9 % — AB (ref 5.7–6.4)
Hemoglobin A1C: 8.9 % — AB (ref 4.0–5.6)

## 2018-03-25 MED ORDER — FLUTICASONE PROPIONATE 50 MCG/ACT NA SUSP: 2.0000 | Freq: Every day | NASAL | 6 refills | Status: DC

## 2018-03-25 MED ORDER — ALLOPURINOL 300 MG PO TABS: 300.0000 mg | ORAL_TABLET | Freq: Every evening | ORAL | 1 refills | Status: DC

## 2018-03-25 MED ORDER — HYDROXYZINE HCL 10 MG PO TABS: 10.0000 mg | ORAL_TABLET | Freq: Three times a day (TID) | ORAL | 0 refills | Status: DC | PRN

## 2018-03-25 MED ORDER — METFORMIN HCL ER 750 MG PO TB24: 1500.0000 mg | ORAL_TABLET | Freq: Every day | ORAL | 0 refills | Status: DC

## 2018-03-25 MED ORDER — NABUMETONE 500 MG PO TABS: 500.0000 mg | ORAL_TABLET | Freq: Two times a day (BID) | ORAL | 0 refills | Status: DC

## 2018-03-25 MED ORDER — METFORMIN HCL ER 750 MG PO TB24: 1500.0000 mg | ORAL_TABLET | Freq: Every day | ORAL | 3 refills | Status: DC

## 2018-03-25 NOTE — Telephone Encounter (Signed)
I sent in 14 tablets for him to get through the next week while he waits on mail order. Please call [facility] and see if they can just fill this temporary Rx. He already has mail order supply coming.

## 2018-03-25 NOTE — Progress Notes (Signed)
Name: Evan Rivas   MRN: [id number]    DOB: November 02, 1950   Date:03/25/2018       Progress Note  Subjective  Chief Complaint  Chief Complaint  Patient presents with  . Follow-up    HPI  Diabetes mellitus type 2 Checking sugars?  Elevated over the last few weeks due to being out of Metformin. How often? Daily Range (low to high) over last two weeks:  152-275 Does patient feel additional teaching/training would be helpful?  no  Have they attended Diabetes education classes? no  Trying to limit white bread, white rice, white potatoes, sweets?  yes Trying to limit sweetened drinks like iced tea, soft drinks, sports drinks, fruit juices?  yes Checking feet every day/night?  yes Last eye exam: Due for eye exam - was done in August 2019 Denies: Polyuria, polydipsia, polyphagia, vision changes, or neuropathy. Most recent A1C:  Lab Results  Component Value Date   HGBA1C 8.9 (A) 03/25/2018   HGBA1C 8.9 03/25/2018   HGBA1C 8.9 (A) 03/25/2018   HGBA1C 8.9 (A) 03/25/2018    We will recheck today. Last CMP Results : is not due for repeat today    Component Value Date/Time   NA 144 03/12/2018 0936   K 4.0 03/12/2018 0936   CL 102 03/12/2018 0936   CO2 22 03/12/2018 0936   GLUCOSE 202 (H) 03/12/2018 0936   GLUCOSE 373 (H) 03/04/2018 1323   BUN 28 (H) 03/12/2018 0936   CREATININE 1.25 03/12/2018 0936   CALCIUM 9.5 03/12/2018 0936   PROT 6.6 09/17/2017 1014   ALBUMIN 4.0 09/17/2017 1014   AST 13 09/17/2017 1014   ALT 16 09/17/2017 1014   ALKPHOS 84 09/17/2017 1014   BILITOT 0.3 09/17/2017 1014   GFRNONAA 59 (L) 03/12/2018 0936   GFRAA 68 03/12/2018 0936  Urine Micro UTD? Yes Current Medication Management: Diabetic Medications: Has been out of Metformin for about 2 weeks due to issues with mail order ACEI/ARB: Yes - Losartan Statin: Yes Aspirin therapy: Yes  Hyperlipidemia: Current Medication Regimen: Last Lipids: Lab Results  Component Value Date   CHOL 140  09/17/2017   HDL 32 (L) 09/17/2017   LDLCALC 80 09/17/2017   TRIG 141 09/17/2017   CHOLHDL 3.8 06/03/2015  - Denies: Chest pain, shortness of breath, myalgias. - Documented aortic atherosclerosis? No - Risk factors for atherosclerosis: diabetes mellitus, hypercholesterolemia and hypertension  HTN:  -does take medications as prescribed, and is seeing Cardiology now for management.  BLE is improving. - taking medications as instructed, no medication side effects noted, no TIAs, no chest pain on exertion, no dyspnea on exertion.- DASH diet discussed - pt does not follow a low sodium diet; was not following over the summer - The following  are contributing factors: stress, sedentary lifestyle, corticosteroid use, saturated fats in diet  Obesity: Body mass index is 33.69 kg/m. Weight Management History: Diet: not "overeating" but not portioning either Exercise: not active Co-Morbid Conditions: diabetes mellitus, dyslipidemias and hypertension; 2 or more of these conditions combined with BMI >30 is considered morbid obesity; is this diagnosis appropriate and/or added to patient's problem list? Yes Unchanged since last visit  Chronic Pain and Opioid Dependence: He is seeing Dr. [name] for management. Taking gabapentin 300mg  TID and Norco, Relafen daily which is prescribed by our office - needs refill today - most recent kidney function is reviewed and is slightly elevated - discussed risk of ongoing NSAID relief. Pain is mostly in low back and bilateral knees (  is s/p bilateral TKR) - states pain has been more and more controlled with new medications. Able to function better recently, but low back pain still bothers him.  Unchanged since last visit.   BLE Edema: Seeing Dr. [name] and Dunn PA-C for management now - they decreased his amolodipine, started him on spironolactone, and encouraged him to wear compression stockings daily.  He had Echo performed, and is having kidney US performed this  Friday.  Discussed decreasing/stopping Nabumatome to help protect kidneys - he is open to this - will decrease to once daily for 1-2 weeks, then stop completely if pain is tolerable.  Depression and Anxiety: He is back on prozac 40mg  and doing well, says his anxiety is almost back to normal, but he still has an increase in anxiety when he is in a store or around a lot of people and at night. He is stressed because his daughter is due to have her baby in the next few days.  Is taking hydroxyzine only as needed - we will refill today.  We discussed possible sedative effects, and he verbalizes understanding.  Daytime Sleepiness:  Very exhausted throughout the day; going to sleep late, up watching TV, wife just retired and this has thrown off his routine.  He does snore, he is obese.  He saw Dr. Ashby Dawes - is planning for home sleep study.   BPH: Nocturia has resolved; doing well on flomax, no concerns today. Stable and unchanged  Gout: Taking Allopurinol daily, no recent flares.   Patient Active Problem List   Diagnosis Date Noted  . Lower extremity edema 12/18/2017  . BPH without urinary obstruction 06/03/2015  . Depression 03/05/2015  . Hyperlipidemia 09/03/2014  . Major depressive disorder, recurrent episode, moderate ([place]) 09/03/2014  . Chronic low back pain 09/03/2014  . Arthralgia of both knees 09/03/2014  . Type 2 diabetes mellitus without complication ([place]) 95/12/3265  . Allergic rhinitis 07/04/2014  . Anxiety 07/04/2014  . Continuous opioid dependence ([place]) 07/04/2014  . DDD (degenerative disc disease), lumbar 07/04/2014  . Gout of foot 07/04/2014  . Essential hypertension 07/04/2014  . Flu vaccine need 07/04/2014  . Adiposity 07/04/2014  . OA (osteoarthritis) of knee 07/04/2014  . Neuralgia neuritis, sciatic nerve 07/04/2014  . Arthralgia of multiple joints 07/04/2014  . H/O malignant neoplasm of skin 12/24/2013    Past Surgical History:  Procedure Laterality Date  .  ARTHROSCOPIC REPAIR ACL Left   . CYSTOSCOPY W/ URETERAL STENT PLACEMENT Right 09/07/2014   Procedure: CYSTOSCOPY WITH STENT REPLACEMENT;  Surgeon: Hollice Espy, MD;  Location: [facility];  Service: Urology;  Laterality: Right;  . CYSTOSCOPY/URETEROSCOPY/HOLMIUM LASER/STENT PLACEMENT Right 08/17/2014   Procedure: CYSTOSCOPY/URETEROSCOPY//STENT PLACEMENT/ attempt of lithotripsy;  Surgeon: Hollice Espy, MD;  Location: [facility];  Service: Urology;  Laterality: Right;  . HERNIA REPAIR    . MOHS SURGERY     lip  . TOTAL KNEE ARTHROPLASTY Left 04/24/2016   Procedure: LEFT TOTAL KNEE ARTHROPLASTY;  Surgeon: Gaynelle Arabian, MD;  Location: [facility];  Service: Orthopedics;  Laterality: Left;  Adductor Block  . TOTAL KNEE ARTHROPLASTY Right 10/23/2016   Procedure: RIGHT TOTAL KNEE ARTHROPLASTY;  Surgeon: Gaynelle Arabian, MD;  Location: [facility];  Service: Orthopedics;  Laterality: Right;  . URETEROSCOPY WITH HOLMIUM LASER LITHOTRIPSY Right 09/07/2014   Procedure: URETEROSCOPY WITH HOLMIUM LASER LITHOTRIPSY;  Surgeon: Hollice Espy, MD;  Location: [facility];  Service: Urology;  Laterality: Right;  [name] VASECTOMY      Family History  Problem Relation Age  of Onset  . Hypertension Mother   . Cancer Mother   . Hyperlipidemia Mother   . Stroke Father   . Heart disease Father   . Depression Sister   . Hypertension Sister   . Hyperlipidemia Sister     Social History   Socioeconomic History  . Marital status: Married    Spouse name: Hoyle Sauer  . Number of children: 1  . Years of education: Not on file  . Highest education level: Not on file  Occupational History  . Occupation: retired  Scientific laboratory technician  . Financial resource strain: Not hard at all  . Food insecurity:    Worry: Never true    Inability: Never true  . Transportation needs:    Medical: No    Non-medical: No  Tobacco Use  . Smoking status: Never Smoker  . Smokeless tobacco: Never Used  Substance and Sexual Activity  . Alcohol use: No  . Drug  use: No  . Sexual activity: Yes  Lifestyle  . Physical activity:    Days per week: 4 days    Minutes per session: 60 min  . Stress: Only a little  Relationships  . Social connections:    Talks on phone: More than three times a week    Gets together: More than three times a week    Attends religious service: 1 to 4 times per year    Active member of club or organization: Yes    Attends meetings of clubs or organizations: 1 to 4 times per year    Relationship status: Married  . Intimate partner violence:    Fear of current or ex partner: No    Emotionally abused: No    Physically abused: Not on file    Forced sexual activity: No  Other Topics Concern  . Not on file  Social History Narrative  . Not on file     Current Outpatient Medications:  .  allopurinol (ZYLOPRIM) 300 MG tablet, Take 1 tablet (300 mg total) by mouth every evening., Disp: 90 tablet, Rfl: 1 .  amLODipine (NORVASC) 5 MG tablet, Take 1 tablet (5 mg total) by mouth daily., Disp: 90 tablet, Rfl: 3 .  ASPIRIN 81 PO, Take 81 mg by mouth daily. , Disp: , Rfl:  .  atorvastatin (LIPITOR) 40 MG tablet, Take 1 tablet (40 mg total) by mouth at bedtime., Disp: 90 tablet, Rfl: 1 .  FLUoxetine (PROZAC) 40 MG capsule, Take 1 capsule (40 mg total) by mouth daily., Disp: 90 capsule, Rfl: 1 .  fluticasone (FLONASE) 50 MCG/ACT nasal spray, Place 2 sprays into both nostrils daily. 2 sprays, Disp: 16 g, Rfl: 6 .  gabapentin (NEURONTIN) 300 MG capsule, Take 1 capsule (300 mg total) by mouth 3 (three) times daily., Disp: 90 capsule, Rfl: 4 .  glipiZIDE (GLUCOTROL) 10 MG tablet, Take 1 tablet (10 mg total) by mouth daily before breakfast., Disp: 90 tablet, Rfl: 1 .  glipiZIDE (GLUCOTROL) 5 MG tablet, Take 1 tablet (5 mg total) by mouth every evening. With dinner, Disp: 90 tablet, Rfl: 1 .  hydrochlorothiazide (HYDRODIURIL) 25 MG tablet, Take 1 tablet (25 mg total) by mouth daily., Disp: 90 tablet, Rfl: 1 .  HYDROcodone-acetaminophen  (NORCO) 10-325 MG tablet, Take 1 tablet by mouth 3 (three) times daily as needed for severe pain., Disp: 90 tablet, Rfl: 0 .  hydrOXYzine (ATARAX/VISTARIL) 10 MG tablet, Take 1 tablet (10 mg total) by mouth every 8 (eight) hours as needed for anxiety., Disp: 30  tablet, Rfl: 0 .  losartan (COZAAR) 100 MG tablet, Take 1 tablet (100 mg total) by mouth at bedtime., Disp: 90 tablet, Rfl: 1 .  metFORMIN (GLUCOPHAGE XR) 750 MG 24 hr tablet, Take 2 tablets (1,500 mg total) by mouth daily with breakfast., Disp: 14 tablet, Rfl: 0 .  metoprolol succinate (TOPROL-XL) 100 MG 24 hr tablet, Take 1 tablet (100 mg total) by mouth every morning., Disp: 90 tablet, Rfl: 0 .  metoprolol succinate (TOPROL-XL) 25 MG 24 hr tablet, Take 2 tablets (50 mg total) by mouth every morning., Disp: 180 tablet, Rfl: 3 .  nabumetone (RELAFEN) 500 MG tablet, Take 1 tablet (500 mg total) by mouth 2 (two) times daily., Disp: 180 tablet, Rfl: 0 .  spironolactone (ALDACTONE) 25 MG tablet, Take 2 tablets (50 mg total) by mouth daily., Disp: 90 tablet, Rfl: 3 .  tamsulosin (FLOMAX) 0.4 MG CAPS capsule, Take 1 capsule (0.4 mg total) by mouth daily after supper., Disp: 90 capsule, Rfl: 0 .  tiZANidine (ZANAFLEX) 4 MG tablet, Take 1 tablet (4 mg total) by mouth at bedtime., Disp: 90 tablet, Rfl: 0 .  glucose blood (ACCU-CHEK AVIVA PLUS) test strip, Use as instructed, Disp: 100 each, Rfl: 12  Allergies  Allergen Reactions  . Celebrex  [Celecoxib]     GI  . Tetanus Toxoids Other (See Comments)    unkown  . Tetanus-Diphtheria Toxoids Td     Other reaction(s): Unknown    I personally reviewed active problem list, medication list, allergies, health maintenance, lab results with the patient/caregiver today.  ROS  Constitutional: Negative for fever or weight change.  Respiratory: Negative for cough and shortness of breath.   Cardiovascular: Negative for chest pain or palpitations.  Gastrointestinal: Negative for abdominal pain, no bowel  changes.  Musculoskeletal: Negative for gait problem or joint swelling.  Skin: Negative for rash.  Neurological: Negative for dizziness or headache.  No other specific complaints in a complete review of systems (except as listed in HPI above).  Objective  Vitals:   03/25/18 0851  BP: 128/78  Pulse: 83  Temp: 98.2 F (36.8 C)  SpO2: 96%  Weight: 269 lb 8 oz (122.2 kg)  Height: 6\' 3"  (1.905 m)    Body mass index is 33.69 kg/m.  Physical Exam Constitutional: Patient appears well-developed and well-nourished. No distress.  HENT: Head: Normocephalic and atraumatic. Ears: bilateral TMs with no erythema or effusion; Nose: Nose normal. Eyes: Conjunctivae and EOM are normal. No scleral icterus.  Pupils are equal, round, and reactive to light.  Neck: Normal range of motion. Neck supple. No JVD present. No thyromegaly present.  Cardiovascular: Normal rate, regular rhythm and normal heart sounds.  No murmur heard. No BLE edema. Pulmonary/Chest: Effort normal and breath sounds normal. No respiratory distress. Musculoskeletal: Normal range of motion, no joint effusions. No gross deformities Neurological: Pt is alert and oriented to person, place, and time. No cranial nerve deficit. Coordination, balance, strength, speech and gait are normal.  Skin: Skin is warm and dry. No rash noted. No erythema.  Psychiatric: Patient has a normal mood and affect. behavior is normal. Judgment and thought content normal.  Results for orders placed or performed in visit on 03/25/18 (from the past 72 hour(s))  POCT glycosylated hemoglobin (Hb A1C)     Status: Abnormal   Collection Time: 03/25/18 10:00 AM  Result Value Ref Range   Hemoglobin A1C 8.9 (A) 4.0 - 5.6 %   HbA1c POC (<> result, manual entry) 8.9 4.0 -  5.6 %   HbA1c, POC (prediabetic range) 8.9 (A) 5.7 - 6.4 %   HbA1c, POC (controlled diabetic range) 8.9 (A) 0.0 - 7.0 %   PHQ2/9: Depression screen [facility] 2/9 03/25/2018 12/18/2017 11/27/2017 09/17/2017  08/30/2017  Decreased Interest 0 0 0 0 0  Down, Depressed, Hopeless 0 0 0 0 0  PHQ - 2 Score 0 0 0 0 0  Altered sleeping 2 1 - 0 -  Tired, decreased energy 1 1 - 1 -  Change in appetite 0 0 - 0 -  Feeling bad or failure about yourself  0 0 - 0 -  Trouble concentrating 0 0 - 0 -  Moving slowly or fidgety/restless 0 0 - 0 -  Suicidal thoughts 0 0 - 0 -  PHQ-9 Score 3 2 - 1 -  Difficult doing work/chores Not difficult at all - - Not difficult at all -   Fall Risk: Fall Risk  03/25/2018 12/18/2017 11/27/2017 08/30/2017 07/11/2017  Falls in the past year? 0 No No No No  Number falls in past yr: - - - - -    Functional Status Survey: Is the patient deaf or have difficulty hearing?: No Does the patient have difficulty seeing, even when wearing glasses/contacts?: No Does the patient have difficulty concentrating, remembering, or making decisions?: No Does the patient have difficulty walking or climbing stairs?: No Does the patient have difficulty dressing or bathing?: Yes Does the patient have difficulty doing errands alone such as visiting a doctor's office or shopping?: No  Assessment & Plan  1. Type 2 diabetes mellitus without complication, without long-term current use of insulin (HCC) - Discussed diet in detail; A1C not obtained until after visit was completed - have reached out via Mychart to discuss management further. - metFORMIN (GLUCOPHAGE XR) 750 MG 24 hr tablet; Take 2 tablets (1,500 mg total) by mouth daily with breakfast.  Dispense: 14 tablet; Refill: 0 - POCT glycosylated hemoglobin (Hb A1C) - Records reqeust sheet to be completed today for eye examination records  2. Arthralgia of multiple joints - Discussed decreasing to once daily and then stopping completely. - nabumetone (RELAFEN) 500 MG tablet; Take 1 tablet (500 mg total) by mouth 2 (two) times daily.  Dispense: 180 tablet; Refill: 0  3. 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  Dispense: 90 tablet; Refill: 1 - Stable  4. Anxiety - hydrOXYzine (ATARAX/VISTARIL) 10 MG tablet; Take 1 tablet (10 mg total) by mouth every 8 (eight) hours as needed for anxiety.  Dispense: 30 tablet; Refill: 0  5. Major depressive disorder, recurrent episode, moderate (HCC) - hydrOXYzine (ATARAX/VISTARIL) 10 MG tablet; Take 1 tablet (10 mg total) by mouth every 8 (eight) hours as needed for anxiety.  Dispense: 30 tablet; Refill: 0  6. Other seasonal allergic rhinitis - fluticasone (FLONASE) 50 MCG/ACT nasal spray; Place 2 sprays into both nostrils daily. 2 sprays  Dispense: 16 g; Refill: 6  7. Continuous opioid dependence ([place]) - Seeing pain management.  8. BPH without urinary obstruction - Stable  9. Pure hypercholesterolemia - Stable; seeing cardiology  10. Lower extremity edema - Improving; seeing cardiology, wearing compression stockings and taking spironolactone  11. Chronic bilateral low back pain without sciatica - Stable, continue seeing pain management  12. Essential hypertension - Seeing cardiology at this point for management. At goal today

## 2018-03-25 NOTE — Patient Instructions (Addendum)
Please have your eye doctor fax your records to our office - Fax # [phone].  Sleep Hygiene Tips 1) Get regular. One of the best ways to train your body to sleep well is to go to bed and get up at more or less the same time every day, even on weekends and days off! This regular rhythm will make you feel better and will give your body something to work from. 2) Sleep when sleepy. Only try to sleep when you actually feel tired or sleepy, rather than spending too much time awake in bed. 3) Get up & try again. If you haven't been able to get to sleep after about 20 minutes or more, get up and do something calming or boring until you feel sleepy, then return to bed and try again. Sit quietly on the couch with the lights off (bright light will tell your brain that it is time to wake up), or read something boring like the phone book. Avoid doing anything that is too stimulating or interesting, as this will wake you up even more. 4) Avoid caffeine & nicotine. It is best to avoid consuming any caffeine (in coffee, tea, cola drinks, chocolate, and some medications) or nicotine (cigarettes) for at least 4-6 hours before going to bed. These substances act as stimulants and interfere with the ability to fall asleep 5) Avoid alcohol. It is also best to avoid alcohol for at least 4-6 hours before going to bed. Many people believe that alcohol is relaxing and helps them to get to sleep at first, but it actually interrupts the quality of sleep. 6) Bed is for sleeping. Try not to use your bed for anything other than sleeping and sex, so that your body comes to associate bed with sleep. If you use bed as a place to watch TV, eat, read, work on your laptop, pay bills, and other things, your body will not learn this Connection. 7) No naps. It is best to avoid taking naps during the day, to make sure that you are tired at bedtime. If you can't make it through the day without a nap, make sure  it is for less than an hour and before 3pm. 8) Sleep rituals. You can develop your own rituals of things to remind your body that it is time to sleep - some people find it useful to do relaxing stretches or breathing exercises for 15 minutes before bed each night, or sit calmly with a cup of caffeine-free tea. 9) Bathtime. Having a hot bath 1-2 hours before bedtime can be useful, as it will raise your body temperature, causing you to feel sleepy as your body temperature drops again. Research shows that sleepiness is associated with a drop in body temperature. 10) No clock-watching. Many people who struggle with sleep tend to watch the clock too much. Frequently checking the clock during the night can wake you up (especially if you turn on the light to read the time) and reinforces negative thoughts such as "Oh no, look how late it is, I'll never get to sleep" or "it's so early, I have only slept for 5 hours, this is terrible." 11) Use a sleep diary. This worksheet can be a useful way of making sure you have the right facts about your sleep, rather than making assumptions. Because a diary involves watching the clock (see point 10) it is a good idea to only use it for two weeks to get an idea of what is going and then  perhaps two months down the track to see how you are progressing. 12) Exercise. Regular exercise is a good idea to help with good sleep, but try not to do strenuous exercise in the 4 hours before bedtime. Morning walks are a great way to start the day feeling refreshed! 13) Eat right. A healthy, balanced diet will help you to sleep well, but timing is important. Some people find that a very empty stomach at bedtime is distracting, so it can be useful to have a light snack, but a heavy meal soon before bed can also interrupt sleep. Some people recommend a warm glass of milk, which contains tryptophan, which acts as a natural sleep inducer. 14) The right space. It is  very important that your bed and bedroom are quiet and comfortable for sleeping. A cooler room with enough blankets to stay warm is best, and make sure you have curtains or an eyemask to block out early morning light and earplugs if there is noise outside your room. 15) Keep daytime routine the same. Even if you have a bad night sleep and are tired it is important that you try to keep your daytime activities the same as you had planned. That is, don't avoid activities because you feel tired. This can reinforce the insomnia.

## 2018-03-29 ENCOUNTER — Ambulatory Visit (INDEPENDENT_AMBULATORY_CARE_PROVIDER_SITE_OTHER): Payer: Medicare PPO

## 2018-03-29 DIAGNOSIS — I10 Essential (primary) hypertension: Secondary | ICD-10-CM | POA: Diagnosis not present

## 2018-04-01 ENCOUNTER — Ambulatory Visit: Payer: Self-pay | Admitting: Pharmacist

## 2018-04-01 DIAGNOSIS — E6609 Other obesity due to excess calories: Secondary | ICD-10-CM

## 2018-04-01 DIAGNOSIS — Z6834 Body mass index (BMI) 34.0-34.9, adult: Secondary | ICD-10-CM

## 2018-04-01 DIAGNOSIS — E119 Type 2 diabetes mellitus without complications: Secondary | ICD-10-CM

## 2018-04-01 MED ORDER — DULAGLUTIDE 0.75 MG/0.5ML ~~LOC~~ SOAJ: 0.7500 mL | SUBCUTANEOUS | 3 refills | Status: DC

## 2018-04-01 NOTE — Patient Instructions (Signed)
Mr. Prokop was given information about Chronic Care Management services today including:  1. CCM service includes personalized support from designated clinical staff supervised by his physician, including individualized plan of care and coordination with other care providers 2. 24/7 contact phone numbers for assistance for urgent and routine care needs. 3. Service will only be billed when office clinical staff spend 20 minutes or more in a month to coordinate care. 4. Only one practitioner may furnish and bill the service in a calendar month. 5. The patient may stop CCM services at any time (effective at the end of the month) by phone call to the office staff. 6. The patient will be responsible for cost sharing (co-pay) of up to 20% of the service fee (after annual deductible is met).  Patient agreed to services and verbal consent obtained.

## 2018-04-01 NOTE — Chronic Care Management (AMB) (Signed)
  Chronic Care Management   Note  04/01/2018 Name: Evan Rivas MRN: [id number] DOB: [birth year]   Evan Rivas is a 67 y.o. year old male who sees Evan Hartshorn, FNP for primary care. Evan Rivas asked the CCM team to consult the patient for assistance with chronic disease management related to diabetes management and medications. Referral was placed 03/26/18. Telephone outreach to patient today to introduce CCM services.   Plan: Evan Rivas agreed to services and verbal consent obtained. I have scheduled an appointment for Evan Rivas in three weeks. Today, we briefly discussed his goals for diabetes, and he would like to not be dependent on insulin for as long as possible.    Evan Rivas was given information about Chronic Care Management services today including:  1. CCM service includes personalized support from designated clinical staff supervised by her physician, including individualized plan of care and coordination with other care providers 2. 24/7 contact phone numbers for assistance for urgent and routine care needs. 3. Service will only be billed when office clinical staff spend 20 minutes or more in a month to coordinate care. 4. Only one practitioner may furnish and bill the service in a calendar month. 5. The patient may stop CCM services at any time (effective at the end of the month) by phone call to the office staff. 6. The patient will be responsible for cost sharing (co-pay) of up to 20% of the service fee (after annual deductible is met).   Evan Rivas, PharmD Clinical Pharmacist [facility] [phone]

## 2018-04-01 NOTE — Telephone Encounter (Signed)
Per [name] PharmD, Trulicity will likely be the most reasonable GLP-1 to start him on.  Please call and let him know I am adding this to his medication regimen and he may come in for a sample and for instructions today/tomorrow/thursday/friday of this week.

## 2018-04-01 NOTE — Telephone Encounter (Signed)
[name] - were you able to look in to medication options for [name]? As in my last message to you, I am needing some help determining what will be affordable on his insurance.  He needs DM medication changes as soon as possible. Thanks!

## 2018-04-05 ENCOUNTER — Ambulatory Visit: Payer: Self-pay

## 2018-04-05 NOTE — Telephone Encounter (Signed)
Pt. Reports Trulicity is going to cost him $ 47 per pen after insurance. Is there something else he can take? Please advise pt.

## 2018-04-09 ENCOUNTER — Ambulatory Visit (INDEPENDENT_AMBULATORY_CARE_PROVIDER_SITE_OTHER): Payer: Medicare PPO | Admitting: Pharmacist

## 2018-04-09 DIAGNOSIS — G4733 Obstructive sleep apnea (adult) (pediatric): Secondary | ICD-10-CM

## 2018-04-09 DIAGNOSIS — E119 Type 2 diabetes mellitus without complications: Secondary | ICD-10-CM

## 2018-04-09 DIAGNOSIS — Z6834 Body mass index (BMI) 34.0-34.9, adult: Secondary | ICD-10-CM

## 2018-04-09 DIAGNOSIS — E6609 Other obesity due to excess calories: Secondary | ICD-10-CM

## 2018-04-09 NOTE — Telephone Encounter (Signed)
[name] - please advise Mr. Butterfield that based on his A1C and our previous trials of oral meds, the only other options are to start on insulin or to go see Endocrinology.  His A1C is almost 2 points above goal.   If you are able to convince him to start insulin, please find out which would be cheapest for him and bring him in for a demo.  - I recommend starting daily basal insulin at 10 units daily and stopping his 5mg  evening glipizide.  I would like for him to check fasting BG's daily, and increase basal insulin by 2 units every 3 days until BG is 80-140 (We will continue to decrease/pull him off of glipizide as his sugars come under control with the insulin).

## 2018-04-10 ENCOUNTER — Encounter: Payer: Self-pay | Admitting: Family Medicine

## 2018-04-11 DIAGNOSIS — G4733 Obstructive sleep apnea (adult) (pediatric): Secondary | ICD-10-CM

## 2018-04-12 ENCOUNTER — Telehealth: Payer: Self-pay

## 2018-04-12 DIAGNOSIS — G4719 Other hypersomnia: Secondary | ICD-10-CM

## 2018-04-12 DIAGNOSIS — G4733 Obstructive sleep apnea (adult) (pediatric): Secondary | ICD-10-CM

## 2018-04-12 NOTE — Telephone Encounter (Signed)
Spoke to patient regarding his sleep study results. Patient is a little hesitant regarding the mask and how it may fit, but wanted to proceed with placing orders.   Severe OSA with AHI of 63. Recommend auto CPAP with pressure range of 5-20 cm H2O. Orders placed.

## 2018-04-15 ENCOUNTER — Other Ambulatory Visit: Payer: Self-pay | Admitting: Pharmacy Technician

## 2018-04-15 NOTE — Patient Instructions (Addendum)
1. Please bring appropriate documents to CCM pharmacist to complete [employer] application for Trulicity 2. Inject Trulicity into abdomen or tops of thighs once weekly using proper injection technique reviewed today.   Please call a member of the CCM (Chronic Care Management) Team with any questions or case management needs:   Vanetta Mulders, BSN Nurse Care Coordinator  [phone]  [name], PharmD  Clinical Pharmacist  [phone]  Goals Addressed            This Visit's Progress   . "I need help affording this new medication" (pt-stated)       Pharmacist Clinical Goal(s): Over the next 10 days, Mr.. Ritson will provide the necessary supplementary documents (proof of out of pocket prescription expenditure, proof of household income) needed for medication assistance applications to CCM pharmacist.   Interventions: CCM pharmacist will apply for medication assistance program for Trulicity made by [employer].   CCM pharmacist educated patient on purpose, proper use, administration and potential adverse effects of Trulicity.    Patient Self Care Activities: ex. Takes medications (independent), administers insulin (in progress), checks cbg daily (independent) . Administer Trulicity once weekly as prescribed .   Please see past updates related to this goal by clicking on the "Past Updates" button in the selected goal            Information about your medication: GLP-1 receptor agonist  Generic Name (Brand):dulaglutide (Trulicity)  GLP-1 receptor agonists bind with the GLP-1 receptor in the pancreas, which will stimulate insulin release. The GLP-1 agonists also delay stomach emptying.   Common SIDE EFFECTS you should be aware of include: nausea, vomiting, diarrhea, or abdominal pain. For most people, these side effects will go away after the first 3 injections. Many people also lose weight.   Inject into abdomen, thighs, or upper arms, once weekly. Rotate injection sites  from week to week.   You and your health care provider may need to check your blood sugars, A1c, and kidney function regularly.   The patient verbalized understanding of instructions provided today and declined a print copy of patient instruction materials.

## 2018-04-15 NOTE — Chronic Care Management (AMB) (Signed)
Chronic Care Management   Note  04/15/2018 Name: Evan Rivas MRN: [id number] DOB: 09/13/1950   68 y.o. year old male referred to [facility] pharmacist for medication assistance.   Referral source: [name], FNP Referral medication(s): Trulicity Current insurance:[employer] Medicare Advantage plan Currently receiving Extra Help:  '[]'  Yes '[x]'  No '[]'  Unknown  Subjective: Evan Rivas is a 68 year old patient seeing [name], FNP, for primary care. PMH includes HTN, T2DM, neuralgia and DDD, gout, BPH, anxiety and depression, and HLD. Referred to CCM for medication assistance for Trulicity. A1c is 8.9% (goal: <7%) on 03/25/18. Current regimen: metformin 1556m daily and glipizide 154mq AM and 12m33m PM. Evan Rivas he would like to avoid insulin if at all possible. His wife is currently out of work and they are living off of his SS income alone.   Objective: Medications Reviewed Today    Reviewed by BoyHubbard HartshornNP (Family Nurse Practitioner) on 03/26/18 at 1101  Med List Status: <None>  Medication Order Taking? Sig Documenting Provider Last Dose Status Informant        Discontinued 03/25/18 0928 (Reorder)   allopurinol (ZYLOPRIM) 300 MG tablet [id number] Take 1 tablet (300 mg total) by mouth every evening. BoyHubbard HartshornNP  Active   amLODipine (NORVASC) 5 MG tablet [id number] Take 1 tablet (5 mg total) by mouth daily. DunRise MuA-C Taking Active   ASPIRIN 81 PO [id number] Take 81 mg by mouth daily.  [provider] Taking Active Self  atorvastatin (LIPITOR) 40 MG tablet [id number] Take 1 tablet (40 mg total) by mouth at bedtime. BoyHubbard HartshornNP Taking Active   FLUoxetine (PROZAC) 40 MG capsule [id number] Take 1 capsule (40 mg total) by mouth daily. BoyHubbard HartshornNP Taking Active         Discontinued 03/25/18 [phone])   fluticasone (FLONASE) 50 MCG/ACT nasal spray [id number] Place 2 sprays into both nostrils daily. 2 sprays BoyHubbard HartshornNP  Active   gabapentin (NEURONTIN) 300 MG capsule [id number] Take 1 capsule (300 mg total) by mouth 3 (three) times daily. LatGillis SantaD Taking Active   glipiZIDE (GLUCOTROL) 10 MG tablet [id number] Take 1 tablet (10 mg total) by mouth daily before breakfast. BoyHubbard HartshornNP Taking Active   glipiZIDE (GLUCOTROL) 5 MG tablet [id number] Take 1 tablet (5 mg total) by mouth every evening. With dinner BoyHubbard HartshornNP Taking Active   glucose blood (ACCU-CHEK AVIVA PLUS) test strip [id number] as instructed ShaRoselee NovaD  Active   hydrochlorothiazide (HYDRODIURIL) 25 MG tablet [id number] Take 1 tablet (25 mg total) by mouth daily. BoyHubbard HartshornNP Taking Active   HYDROcodone-acetaminophen (NOThe [facility] Center0-325 MG tablet [id number] Take 1 tablet by mouth 3 (three) times daily as needed for severe pain. LatGillis SantaD Taking Active         Discontinued 03/25/18 [phone])   hydrOXYzine (ATARAX/VISTARIL) 10 MG tablet [id number] Take 1 tablet (10 mg total) by mouth every 8 (eight) hours as needed for anxiety. BoyHubbard HartshornNP  Active   losartan (COZAAR) 100 MG tablet [id number] Take 1 tablet (100 mg total) by mouth at bedtime. BoyHubbard HartshornNP Taking Active         Discontinued 03/25/18 0912 (Reorder)         Discontinued 03/25/18 0913 (Reorder)   metFORMIN (GLUCOPHAGE XR) 750  MG 24 hr tablet [id number] Yes Take 2 tablets (1,500 mg total) by mouth daily with breakfast. Hubbard Hartshorn, FNP  Active         Discontinued 03/25/18 [phone] (Completed Course)   metoprolol succinate (TOPROL-XL) 100 MG 24 hr tablet [id number] Yes Take 1 tablet (100 mg total) by mouth every morning. Hubbard Hartshorn, FNP Taking Active   metoprolol succinate (TOPROL-XL) 25 MG 24 hr tablet [id number] Yes Take 2 tablets (50 mg total) by mouth every morning. Rise Mu, PA-C Taking Active         Discontinued 03/25/18 [phone] (Reorder)   nabumetone (RELAFEN) 500 MG tablet [id number] Yes  Take 1 tablet (500 mg total) by mouth 2 (two) times daily. Hubbard Hartshorn, FNP  Active   spironolactone (ALDACTONE) 25 MG tablet [id number] Yes Take 2 tablets (50 mg total) by mouth daily. Rise Mu, PA-C Taking Active   tamsulosin [facility]) 0.4 MG CAPS capsule [id number] Yes Take 1 capsule (0.4 mg total) by mouth daily after supper. Hubbard Hartshorn, FNP Taking Active   tiZANidine (ZANAFLEX) 4 MG tablet [id number] Yes Take 1 tablet (4 mg total) by mouth at bedtime. Roselee Nova, MD Taking Active Self  Med List Note Rise Patience, RN 02/26/18 1053): UDS 02/26/18 MR 05/08/2018 Opiod contract signed by pt.           Assessment:  Medications appropriate for PMH. Need fill history to accurately interpret compliance, particularly for BP medications and statin. Metoprolol duplicate entry from Cardiology and [facility].   Medication Assistance Findings:  Extra Help:   '[]'  Already receiving  '[x]'  Eligible based on reported income  '[]'  Not Eligible based on reported income  Patient Assistance Programs: 1) Trulicity made by [place] requirement met: '[x]'  Yes '[]'  No o [place] requirement met: '[x]'  Yes '[]'  No  - TROOP not required by [employer] in 2020     Additional medication assistance options reviewed with patient as warranted including:  [name] Mail order programs (patient currently using) . Insurance OTC catalogues  Plan: - Apply for [employer] MAP for Trulicity - If required, [name] can send in additional prescription for Basaglar after 2-3 months to [employer] - Patient needs in depth medication review with CCM pharmacist for adherence assessment  - patient counseled and trained on Trulicity administration, use, side effects  Goals Addressed            This Visit's Progress   . "I need help affording this new medication" (pt-stated)       Pharmacist Clinical Goal(s): Over the next 10 days, Evan Rivas will provide the necessary supplementary documents (proof of out of pocket  prescription expenditure, proof of household income) needed for medication assistance applications to CCM pharmacist.   Interventions: CCM pharmacist will apply for medication assistance program for Trulicity made by [employer].   CCM pharmacist educated patient on purpose, proper use, administration and potential adverse effects of Trulicity.    Patient Self Care Activities: ex. Takes medications (independent), administers insulin (in progress), checks cbg daily (independent) . Administer Trulicity once weekly as prescribed .   Please see past updates related to this goal by clicking on the "Past Updates" button in the selected goal           Follow up: 2 weeks - schedule medication review, f/u on application status   [name], PharmD Clinical Pharmacist [place] [phone]

## 2018-04-18 ENCOUNTER — Other Ambulatory Visit: Payer: Self-pay | Admitting: Pharmacy Technician

## 2018-04-18 NOTE — Patient Outreach (Signed)
[place] [facility]) Care Management  04/18/2018  DVAUGHN FICKLE [birth year] [id number]                                                  Medication Assistance Referral  Referral From: [facility] RPh Fabio Neighbors  Medication/Company: Danelle Berry / [place] patient assistance application via interoffice from [place]. Faxed completed application and required documents to [employer].  Will follow up with company in 7-10 business days to check status of application.  Maud Deed Chana Bode Clifford Certified Pharmacy Technician [place] Management Direct Dial:[phone]

## 2018-04-23 ENCOUNTER — Ambulatory Visit: Payer: Self-pay | Admitting: Pharmacist

## 2018-04-23 ENCOUNTER — Other Ambulatory Visit: Payer: Self-pay

## 2018-04-23 ENCOUNTER — Ambulatory Visit
Payer: Medicare PPO | Attending: Student in an Organized Health Care Education/Training Program | Admitting: Student in an Organized Health Care Education/Training Program

## 2018-04-23 ENCOUNTER — Encounter: Payer: Self-pay | Admitting: Student in an Organized Health Care Education/Training Program

## 2018-04-23 VITALS — BP 169/80 | HR 74 | Temp 97.6°F | Resp 18 | Ht 74.0 in | Wt 270.0 lb

## 2018-04-23 DIAGNOSIS — M5136 Other intervertebral disc degeneration, lumbar region: Secondary | ICD-10-CM

## 2018-04-23 DIAGNOSIS — G894 Chronic pain syndrome: Secondary | ICD-10-CM

## 2018-04-23 DIAGNOSIS — M47816 Spondylosis without myelopathy or radiculopathy, lumbar region: Secondary | ICD-10-CM

## 2018-04-23 DIAGNOSIS — Z96653 Presence of artificial knee joint, bilateral: Secondary | ICD-10-CM

## 2018-04-23 DIAGNOSIS — M545 Low back pain: Secondary | ICD-10-CM

## 2018-04-23 DIAGNOSIS — M170 Bilateral primary osteoarthritis of knee: Secondary | ICD-10-CM | POA: Diagnosis present

## 2018-04-23 DIAGNOSIS — M2550 Pain in unspecified joint: Secondary | ICD-10-CM

## 2018-04-23 DIAGNOSIS — G8929 Other chronic pain: Secondary | ICD-10-CM

## 2018-04-23 DIAGNOSIS — M51369 Other intervertebral disc degeneration, lumbar region without mention of lumbar back pain or lower extremity pain: Secondary | ICD-10-CM

## 2018-04-23 MED ORDER — HYDROCODONE-ACETAMINOPHEN 10-325 MG PO TABS: 1.0000 | ORAL_TABLET | Freq: Three times a day (TID) | ORAL | 0 refills | Status: AC | PRN

## 2018-04-23 MED ORDER — GABAPENTIN 300 MG PO CAPS: 300.0000 mg | ORAL_CAPSULE | Freq: Three times a day (TID) | ORAL | 0 refills | Status: DC

## 2018-04-23 MED ORDER — NABUMETONE 500 MG PO TABS: 500.0000 mg | ORAL_TABLET | Freq: Once | ORAL | 0 refills | Status: AC | PRN

## 2018-04-23 NOTE — Progress Notes (Signed)
Nursing Pain Medication Assessment:  Safety precautions to be maintained throughout the outpatient stay will include: orient to surroundings, keep bed in low position, maintain call bell within reach at all times, provide assistance with transfer out of bed and ambulation.  Medication Inspection Compliance: Pill count conducted under aseptic conditions, in front of the patient. Neither the pills nor the bottle was removed from the patient's sight at any time. Once count was completed pills were immediately returned to the patient in their original bottle.  Medication: Hydrocodone/APAP Pill/Patch Count: 42 of 90 pills remain Pill/Patch Appearance: Markings consistent with prescribed medication Bottle Appearance: Standard pharmacy container. Clearly labeled. Filled Date: 1 / 3 / 2020 Last Medication intake:  Today

## 2018-04-23 NOTE — Patient Instructions (Signed)
The patient verbalized understanding of instructions provided today and declined a print copy of patient instruction materials.   

## 2018-04-23 NOTE — Progress Notes (Signed)
Patient's Name: Evan Rivas  MRN: [id number]  Referring Provider: Hubbard Hartshorn, FNP  DOB: 10-29-1950  PCP: Hubbard Hartshorn, FNP  DOS: 04/23/2018  Note by: Gillis Santa, MD  Service setting: Ambulatory outpatient  Specialty: Interventional Pain Management  Location: [facility] (AMB) Pain Management Facility    Patient type: Established   Primary Reason(s) for Visit: Encounter for prescription drug management. (Level of risk: moderate)  CC: Medication Refill (Hydocodone/acetamin. ); Back Pain; and Knee Pain  HPI  Evan Rivas is a 68 y.o. year old, male patient, who comes today for a medication management evaluation. He has Allergic rhinitis; Anxiety; Continuous opioid dependence ([place]); DDD (degenerative disc disease), lumbar; Gout of foot; Essential hypertension; Flu vaccine need; Adiposity; OA (osteoarthritis) of knee; Neuralgia neuritis, sciatic nerve; Arthralgia of multiple joints; H/O malignant neoplasm of skin; Hyperlipidemia; Major depressive disorder, recurrent episode, moderate ([place]); Chronic low back pain; Arthralgia of both knees; Type 2 diabetes mellitus without complication ([place]); Depression; BPH without urinary obstruction; and Lower extremity edema on their problem list. His primarily concern today is the Medication Refill (Hydocodone/acetamin. ); Back Pain; and Knee Pain  Pain Assessment: Location: Lower Back Radiating: Denies  Onset: More than a month ago Duration: Chronic pain Quality: Aching, Discomfort("Aggravating") Severity: 4 /10 (subjective, self-reported pain score)  Note: Reported level is compatible with observation.                         When using our objective Pain Scale, levels between 6 and 10/10 are said to belong in an emergency room, as it progressively worsens from a 6/10, described as severely limiting, requiring emergency care not usually available at an outpatient pain management facility. At a 6/10 level, communication becomes difficult and requires great  effort. Assistance to reach the emergency department may be required. Facial flushing and profuse sweating along with potentially dangerous increases in heart rate and blood pressure will be evident. Effect on ADL: "Just annoying if I turn the wrong way, something hurts"  Timing: Constant Modifying factors: gabapentin and hydrocodone BP: (!) 169/80  HR: 74  Evan Rivas was last scheduled for an appointment on 02/26/2018 for medication management. During today's appointment we reviewed Evan Rivas's chronic pain status, as well as his outpatient medication regimen.  Patient follows up today for medication management.  Overall his pain is at baseline.  He states that since his last visit, his primary care provider has discontinued his Relafen.  He was wondering if he can restart this medication or if there are any alternatives.  He states that this medication was effective for his joint pain.  The patient  reports no history of drug use. His body mass index is 34.67 kg/m.  Further details on both, my assessment(s), as well as the proposed treatment plan, please see below.  Controlled Substance Pharmacotherapy Assessment REMS (Risk Evaluation and Mitigation Strategy)  Analgesic: Norco 10 mg 3 times daily as needed, quantity 90/month MME/day: 30 mg/day Janne Napoleon, RN  04/23/2018  9:50 AM  Sign when Signing Visit Nursing Pain Medication Assessment:  Safety precautions to be maintained throughout the outpatient stay will include: orient to surroundings, keep bed in low position, maintain call bell within reach at all times, provide assistance with transfer out of bed and ambulation.  Medication Inspection Compliance: Pill count conducted under aseptic conditions, in front of the patient. Neither the pills nor the bottle was removed from the patient's sight at any time. Once count  was completed pills were immediately returned to the patient in their original bottle.  Medication:  Hydrocodone/APAP Pill/Patch Count: 42 of 90 pills remain Pill/Patch Appearance: Markings consistent with prescribed medication Bottle Appearance: Standard pharmacy container. Clearly labeled. Filled Date: 1 / 3 / 2020 Last Medication intake:  Today   Pharmacokinetics: Liberation and absorption (onset of action): WNL Distribution (time to peak effect): WNL Metabolism and excretion (duration of action): WNL         Pharmacodynamics: Desired effects: Analgesia: Evan Rivas reports >50% benefit. Functional ability: Patient reports that medication allows him to accomplish basic ADLs Clinically meaningful improvement in function (CMIF): Sustained CMIF goals met Perceived effectiveness: Described as relatively effective, allowing for increase in activities of daily living (ADL) Undesirable effects: Side-effects or Adverse reactions: None reported Monitoring: [place] PMP: Online review of the past 4-monthperiod conducted. Compliant with practice rules and regulations Last UDS on record: Summary  Date Value Ref Range Status  02/26/2018 FINAL  Final    Comment:    ==================================================================== TOXASSURE SELECT 13 (MW) ==================================================================== Test                             Result       Flag       Units Drug Present and Declared for Prescription Verification   Hydrocodone                    763          EXPECTED   ng/mg creat   Hydromorphone                  82           EXPECTED   ng/mg creat   Dihydrocodeine                 234          EXPECTED   ng/mg creat   Norhydrocodone                 >2439        EXPECTED   ng/mg creat    Sources of hydrocodone include scheduled prescription    medications. Hydromorphone, dihydrocodeine and norhydrocodone are    expected metabolites of hydrocodone. Hydromorphone and    dihydrocodeine are also available as scheduled prescription    medications. Drug Present not  Declared for Prescription Verification   Alcohol, Ethyl                 0.026        UNEXPECTED g/dL    Sources of ethyl alcohol include alcoholic beverages or as a    fermentation product of glucose; glucose is present in this    specimen.  Interpret result with caution, as the presence of    ethyl alcohol is likely due, at least in part, to fermentation of    glucose. ==================================================================== Test                      Result    Flag   Units      Ref Range   Creatinine              205              mg/dL      >=20 ==================================================================== Declared Medications:  The flagging and interpretation on this report are based on the  following declared  medications.  Unexpected results may arise from  inaccuracies in the declared medications.  **Note: The testing scope of this panel includes these medications:  Hydrocodone (Hydrocodone-Acetaminophen)  **Note: The testing scope of this panel does not include following  reported medications:  Acetaminophen (Hydrocodone-Acetaminophen)  Allopurinol  Amlodipine  Aspirin (Aspirin 81)  Atorvastatin  Fluoxetine  Fluticasone  Gabapentin  Glipizide  Hydrochlorothiazide  Hydroxyzine  Losartan  Metformin  Methocarbamol  Metoprolol  Nabumetone  Spironolactone (Aldactone)  Tamsulosin  Tizanidine ==================================================================== For clinical consultation, please call [phone]. ====================================================================    UDS interpretation: Compliant          Medication Assessment Form: Reviewed. Patient indicates being compliant with therapy Treatment compliance: Compliant Risk Assessment Profile: Aberrant behavior: See prior evaluations. None observed or detected today Comorbid factors increasing risk of overdose: See prior notes. No additional risks detected today Opioid risk tool  (ORT) (Total Score): 1 Personal History of Substance Abuse (SUD-Substance use disorder):  Alcohol: Negative  Illegal Drugs: Negative  Rx Drugs: Negative  ORT Risk Level calculation: Low Risk Risk of substance use disorder (SUD): Low Opioid Risk Tool - 04/23/18 0947      Family History of Substance Abuse   Alcohol  Negative    Illegal Drugs  Negative    Rx Drugs  Negative      Personal History of Substance Abuse   Alcohol  Negative    Illegal Drugs  Negative    Rx Drugs  Negative      Age   Age between 71-45 years   No      History of Preadolescent Sexual Abuse   History of Preadolescent Sexual Abuse  Negative or Male      Psychological Disease   Psychological Disease  Negative    ADD  Negative    OCD  Negative    Bipolar  Negative    Schizophrenia  Negative    Depression  Positive      Total Score   Opioid Risk Tool Scoring  1    Opioid Risk Interpretation  Low Risk      ORT Scoring interpretation table:  Score <3 = Low Risk for SUD  Score between 4-7 = Moderate Risk for SUD  Score >8 = High Risk for Opioid Abuse   Risk Mitigation Strategies:  Patient Counseling: Covered Patient-Prescriber Agreement (PPA): Present and active  Notification to other healthcare providers: Done  Pharmacologic Plan: No change in therapy, at this time.             Laboratory Chemistry  Inflammation Markers (CRP: Acute Phase) (ESR: Chronic Phase) Lab Results  Component Value Date   LATICACIDVEN 1.5 07/19/2016                         Rheumatology Markers Lab Results  Component Value Date   LABURIC 4.9 09/17/2017                        Renal Function Markers Lab Results  Component Value Date   BUN 28 (H) 03/12/2018   CREATININE 1.25 03/12/2018   BCR 22 03/12/2018   GFRAA 68 03/12/2018   GFRNONAA 59 (L) 03/12/2018                             Hepatic Function Markers Lab Results  Component Value Date   AST 13 09/17/2017   ALT 16 09/17/2017  ALBUMIN 4.0 09/17/2017    ALKPHOS 84 09/17/2017                        Electrolytes Lab Results  Component Value Date   NA 144 03/12/2018   K 4.0 03/12/2018   CL 102 03/12/2018   CALCIUM 9.5 03/12/2018   MG 1.3 (L) 07/19/2016                        Neuropathy Markers Lab Results  Component Value Date   HGBA1C 8.9 (A) 03/25/2018   HGBA1C 8.9 03/25/2018   HGBA1C 8.9 (A) 03/25/2018   HGBA1C 8.9 (A) 03/25/2018                        CNS Tests No results found for: COLORCSF, APPEARCSF, RBCCOUNTCSF, WBCCSF, POLYSCSF, LYMPHSCSF, EOSCSF, PROTEINCSF, GLUCCSF, JCVIRUS, CSFOLI, IGGCSF                      Bone Pathology Markers No results found for: VD25OH, [id number], [id number], [id number], 25OHVITD1, 25OHVITD2, 25OHVITD3, TESTOFREE, TESTOSTERONE                       Coagulation Parameters Lab Results  Component Value Date   INR 0.97 10/18/2016   LABPROT 12.9 10/18/2016   APTT 28 10/18/2016   PLT 216 04/24/2017                        Cardiovascular Markers Lab Results  Component Value Date   BNP 65.0 04/24/2017   HGB 14.4 04/24/2017   HCT 42.7 04/24/2017                         CA Markers No results found for: CEA, CA125, LABCA2                      Note: Lab results reviewed.  Recent Diagnostic Imaging Results  VAS US RENAL ARTERY DUPLEX ABDOMINAL VISCERAL  Indications: Hypertension  High Risk Factors: Hypertension, hyperlipidemia, Diabetes, no history of                    smoking.  Other Factors: Elevated BUN and decreased GFR on 03/12/18 labs. Last blood                pressures 128/78, 160/88, 150/88 mmHg.  Limitations: Air/bowel gas and obesity. Comparison Study: None  Performing Technologist: Pilar Jarvis RDMS, RVT, RDCS    Examination Guidelines: A complete evaluation includes B-mode imaging, spectral Doppler, color Doppler, and power Doppler as needed of all accessible portions of each vessel. Bilateral testing is considered an integral part of a complete examination.  Limited examinations for reoccurring indications may be performed as noted.    Duplex Findings: +--------------------+--------+--------+------+--------+ Mesenteric          PSV cm/sEDV cm/sPlaqueComments +--------------------+--------+--------+------+--------+ Aorta at SMA          104      19                  +--------------------+--------+--------+------+--------+ Aorta Mid             100                          +--------------------+--------+--------+------+--------+ Aorta Distal  140                          +--------------------+--------+--------+------+--------+ Celiac Artery Origin  155      27                  +--------------------+--------+--------+------+--------+ SMA Proximal          160      17                  +--------------------+--------+--------+------+--------+    +------------------+--------+--------+-------+ Right Renal ArteryPSV cm/sEDV cm/sComment +------------------+--------+--------+-------+ Origin               92      15           +------------------+--------+--------+-------+ Proximal            107      16           +------------------+--------+--------+-------+ Mid                  96      21           +------------------+--------+--------+-------+ Distal               56      10           +------------------+--------+--------+-------+  +-----------------+--------+--------+-------+ Left Renal ArteryPSV cm/sEDV cm/sComment +-----------------+--------+--------+-------+ Origin             107      11           +-----------------+--------+--------+-------+ Proximal            95      15           +-----------------+--------+--------+-------+ Mid                106      14           +-----------------+--------+--------+-------+ Distal              60      11           +-----------------+--------+--------+-------+  Technologist observations Renal  Artery(s):Right lower pole cystic structure measures 3.5x2.9 cm Left lower pole cystic structure measures 1.9x2.0 cm and a second midpole measures 2.2x2.1 cm +------------+--------+--------+----+-----------+--------+--------+----+ Right KidneyPSV cm/sEDV cm/sRI  Left KidneyPSV cm/sEDV cm/sRI   +------------+--------+--------+----+-----------+--------+--------+----+ Upper Pole  20      5       0.73Upper Pole 20      5       0.74 +------------+--------+--------+----+-----------+--------+--------+----+ Mid         31      7       0.76Mid        20      6       0.69 +------------+--------+--------+----+-----------+--------+--------+----+ Lower Pole  23      7       0.71Lower Pole 17      5       0.70 +------------+--------+--------+----+-----------+--------+--------+----+ Hilar       58      10      0.82Hilar      39      9       0.77 +------------+--------+--------+----+-----------+--------+--------+----+  +------------------+--------+------------------+--------+ Right Kidney              Left Kidney                +------------------+--------+------------------+--------+ RAR  RAR                        +------------------+--------+------------------+--------+ RAR (manual)      1.13    RAR (manual)      1.02     +------------------+--------+------------------+--------+ Cortex                    Cortex                     +------------------+--------+------------------+--------+ Cortex thickness  15.00 mmCorex thickness   11.00 mm +------------------+--------+------------------+--------+ Kidney length (cm)13.63   Kidney length (cm)14.41    +------------------+--------+------------------+--------+    Summary: Largest Aortic Diameter: 2.0 cm   Renal:   Right: Normal size right kidney. Normal right Resisitive Index.        Abnormal cortical thickness of right kidney. No evidence of         right renal artery stenosis. RRV flow present. Left:  LRV flow present. No evidence of left renal artery stenosis.        Normal size of left kidney. Normal left Resistive Index.        Normal cortical thickness of the left kidney. Mesenteric: Normal Celiac artery and Superior Mesenteric artery findings.   *See table(s) above for measurements and observations.   Diagnosing physician: Carlyle Dolly MD   Electronically signed by Carlyle Dolly MD on 03/29/2018 at 12:19:48 PM.       Final    Complexity Note: Imaging results reviewed. Results shared with Mr. Clarin, using [employer].                         Meds   Current Outpatient Medications:  .  allopurinol (ZYLOPRIM) 300 MG tablet, Take 1 tablet (300 mg total) by mouth every evening., Disp: 90 tablet, Rfl: 1 .  amLODipine (NORVASC) 5 MG tablet, Take 1 tablet (5 mg total) by mouth daily., Disp: 90 tablet, Rfl: 3 .  ASPIRIN 81 PO, Take 81 mg by mouth daily. , Disp: , Rfl:  .  atorvastatin (LIPITOR) 40 MG tablet, Take 1 tablet (40 mg total) by mouth at bedtime., Disp: 90 tablet, Rfl: 1 .  Dulaglutide (TRULICITY) 7.78 EU/2.3NT SOPN, Inject 0.75 mLs into the skin once a week., Disp: 2 mL, Rfl: 3 .  FLUoxetine (PROZAC) 40 MG capsule, Take 1 capsule (40 mg total) by mouth daily., Disp: 90 capsule, Rfl: 1 .  fluticasone (FLONASE) 50 MCG/ACT nasal spray, Place 2 sprays into both nostrils daily. 2 sprays, Disp: 16 g, Rfl: 6 .  gabapentin (NEURONTIN) 300 MG capsule, Take 1 capsule (300 mg total) by mouth 3 (three) times daily., Disp: 270 capsule, Rfl: 0 .  glipiZIDE (GLUCOTROL) 10 MG tablet, Take 1 tablet (10 mg total) by mouth daily before breakfast., Disp: 90 tablet, Rfl: 1 .  glipiZIDE (GLUCOTROL) 5 MG tablet, Take 1 tablet (5 mg total) by mouth every evening. With dinner, Disp: 90 tablet, Rfl: 1 .  glucose blood (ACCU-CHEK AVIVA PLUS) test strip, Use as instructed, Disp: 100 each, Rfl: 12 .  hydrochlorothiazide (HYDRODIURIL) 25  MG tablet, Take 1 tablet (25 mg total) by mouth daily., Disp: 90 tablet, Rfl: 1 .  hydrOXYzine (ATARAX/VISTARIL) 10 MG tablet, Take 1 tablet (10 mg total) by mouth every 8 (eight) hours as needed for anxiety., Disp: 30 tablet, Rfl: 0 .  losartan (COZAAR) 100 MG tablet, Take 1 tablet (100 mg total) by mouth  at bedtime., Disp: 90 tablet, Rfl: 1 .  metFORMIN (GLUCOPHAGE XR) 750 MG 24 hr tablet, Take 2 tablets (1,500 mg total) by mouth daily with breakfast., Disp: 14 tablet, Rfl: 0 .  metoprolol succinate (TOPROL-XL) 100 MG 24 hr tablet, Take 1 tablet (100 mg total) by mouth every morning., Disp: 90 tablet, Rfl: 0 .  metoprolol succinate (TOPROL-XL) 25 MG 24 hr tablet, Take 2 tablets (50 mg total) by mouth every morning., Disp: 180 tablet, Rfl: 3 .  spironolactone (ALDACTONE) 25 MG tablet, Take 2 tablets (50 mg total) by mouth daily., Disp: 90 tablet, Rfl: 3 .  tamsulosin (FLOMAX) 0.4 MG CAPS capsule, Take 1 capsule (0.4 mg total) by mouth daily after supper., Disp: 90 capsule, Rfl: 0 .  tiZANidine (ZANAFLEX) 4 MG tablet, Take 1 tablet (4 mg total) by mouth at bedtime., Disp: 90 tablet, Rfl: 0 .  [START ON 05/08/2018] HYDROcodone-acetaminophen (NORCO) 10-325 MG tablet, Take 1 tablet by mouth every 8 (eight) hours as needed for up to 30 days for severe pain., Disp: 90 tablet, Rfl: 0 .  [START ON 06/07/2018] HYDROcodone-acetaminophen (NORCO) 10-325 MG tablet, Take 1 tablet by mouth every 8 (eight) hours as needed for up to 30 days for severe pain., Disp: 90 tablet, Rfl: 0 .  [START ON 07/07/2018] HYDROcodone-acetaminophen (NORCO) 10-325 MG tablet, Take 1 tablet by mouth every 8 (eight) hours as needed for up to 30 days for severe pain., Disp: 90 tablet, Rfl: 0 .  nabumetone (RELAFEN) 500 MG tablet, Take 1 tablet (500 mg total) by mouth once as needed for mild pain., Disp: 90 tablet, Rfl: 0  ROS  Constitutional: Denies any fever or chills Gastrointestinal: No reported hemesis, hematochezia, vomiting, or acute GI  distress Musculoskeletal: Denies any acute onset joint swelling, redness, loss of ROM, or weakness Neurological: No reported episodes of acute onset apraxia, aphasia, dysarthria, agnosia, amnesia, paralysis, loss of coordination, or loss of consciousness  Allergies  Mr. Gibbs is allergic to celebrex  [celecoxib]; tetanus toxoids; and tetanus-diphtheria toxoids td.  [place]  Drug: Mr. Lazare  reports no history of drug use. Alcohol:  reports no history of alcohol use. Tobacco:  reports that he has never smoked. He has never used smokeless tobacco. Medical:  has a past medical history of Anxiety, Arthritis, Cancer ([place]), Depression, Diabetes mellitus without complication ([place]), GERD (gastroesophageal reflux disease), Gout, History of kidney stones, Hyperlipidemia, Hypertension, and Pneumonia. Surgical: Mr. Scripter  has a past surgical history that includes Hernia repair; Arthroscopic repair ACL (Left); Mohs surgery; Cystoscopy/ureteroscopy/holmium laser/stent placement (Right, 08/17/2014); Ureteroscopy with holmium laser lithotripsy (Right, 09/07/2014); Cystoscopy w/ ureteral stent placement (Right, 09/07/2014); Vasectomy; Total knee arthroplasty (Left, 04/24/2016); and Total knee arthroplasty (Right, 10/23/2016). Family: family history includes Cancer in his mother; Depression in his sister; Heart disease in his father; Hyperlipidemia in his mother and sister; Hypertension in his mother and sister; Stroke in his father.  Constitutional Exam  General appearance: Well nourished, well developed, and well hydrated. In no apparent acute distress Vitals:   04/23/18 0937  BP: (!) 169/80  Pulse: 74  Resp: 18  Temp: 97.6 F (36.4 C)  SpO2: 98%  Weight: 270 lb (122.5 kg)  Height: '6\' 2"'  (1.88 m)   BMI Assessment: Estimated body mass index is 34.67 kg/m as calculated from the following:   Height as of this encounter: '6\' 2"'  (1.88 m).   Weight as of this encounter: 270 lb (122.5 kg).  BMI  interpretation table: BMI level Category Range association with  higher incidence of chronic pain  <18 kg/m2 Underweight   18.5-24.9 kg/m2 Ideal body weight   25-29.9 kg/m2 Overweight Increased incidence by 20%  30-34.9 kg/m2 Obese (Class I) Increased incidence by 68%  35-39.9 kg/m2 Severe obesity (Class II) Increased incidence by 136%  >40 kg/m2 Extreme obesity (Class III) Increased incidence by 254%   Patient's current BMI Ideal Body weight  Body mass index is 34.67 kg/m. Ideal body weight: 82.2 kg (181 lb 3.5 oz) Adjusted ideal body weight: 98.3 kg (216 lb 11.7 oz)   BMI Readings from Last 4 Encounters:  04/23/18 34.67 kg/m  03/25/18 33.69 kg/m  03/19/18 34.00 kg/m  03/12/18 33.75 kg/m   Wt Readings from Last 4 Encounters:  04/23/18 270 lb (122.5 kg)  03/25/18 269 lb 8 oz (122.2 kg)  03/19/18 272 lb (123.4 kg)  03/12/18 270 lb (122.5 kg)  Psych/Mental status: Alert, oriented x 3 (person, place, & time)       Eyes: PERLA Respiratory: No evidence of acute respiratory distress  Cervical Spine Area Exam  Skin & Axial Inspection: No masses, redness, edema, swelling, or associated skin lesions Alignment: Symmetrical Functional ROM: Unrestricted ROM      Stability: No instability detected Muscle Tone/Strength: Functionally intact. No obvious neuro-muscular anomalies detected. Sensory (Neurological): Unimpaired Palpation: No palpable anomalies              Upper Extremity (UE) Exam    Side: Right upper extremity  Side: Left upper extremity  Skin & Extremity Inspection: Skin color, temperature, and hair growth are WNL. No peripheral edema or cyanosis. No masses, redness, swelling, asymmetry, or associated skin lesions. No contractures.  Skin & Extremity Inspection: Skin color, temperature, and hair growth are WNL. No peripheral edema or cyanosis. No masses, redness, swelling, asymmetry, or associated skin lesions. No contractures.  Functional ROM: Unrestricted ROM           Functional ROM: Unrestricted ROM          Muscle Tone/Strength: Functionally intact. No obvious neuro-muscular anomalies detected.  Muscle Tone/Strength: Functionally intact. No obvious neuro-muscular anomalies detected.  Sensory (Neurological): Unimpaired          Sensory (Neurological): Unimpaired          Palpation: No palpable anomalies              Palpation: No palpable anomalies              Provocative Test(s):  Phalen's test: deferred Tinel's test: deferred Apley's scratch test (touch opposite shoulder):  Action 1 (Across chest): deferred Action 2 (Overhead): deferred Action 3 (LB reach): deferred   Provocative Test(s):  Phalen's test: deferred Tinel's test: deferred Apley's scratch test (touch opposite shoulder):  Action 1 (Across chest): deferred Action 2 (Overhead): deferred Action 3 (LB reach): deferred    Thoracic Spine Area Exam  Skin & Axial Inspection: No masses, redness, or swelling Alignment: Symmetrical Functional ROM: Unrestricted ROM Stability: No instability detected Muscle Tone/Strength: Functionally intact. No obvious neuro-muscular anomalies detected. Sensory (Neurological): Unimpaired Muscle strength & Tone: No palpable anomalies  Lumbar Spine Area Exam  Skin & Axial Inspection: No masses, redness, or swelling Alignment: Symmetrical Functional ROM: Adequate ROM       Stability: No instability detected Muscle Tone/Strength: Functionally intact. No obvious neuro-muscular anomalies detected. Sensory (Neurological): Musculoskeletal pain pattern Palpation: No palpable anomalies       Provocative Tests: Hyperextension/rotation test: (+) due to pain. Lumbar quadrant test (Kemp's test): deferred today  Lateral bending test: deferred today       Patrick's Maneuver: deferred today                   FABER* test: deferred today                   S-I anterior distraction/compression test: deferred today         S-I lateral compression test: deferred  today         S-I Thigh-thrust test: deferred today         S-I Gaenslen's test: deferred today         *(Flexion, ABduction and External Rotation)  Gait & Posture Assessment  Ambulation: Unassisted Gait: Relatively normal for age and body habitus Posture: WNL   Lower Extremity Exam    Side: Right lower extremity  Side: Left lower extremity  Stability: No instability observed          Stability: No instability observed          Skin & Extremity Inspection: Skin color, temperature, and hair growth are WNL. No peripheral edema or cyanosis. No masses, redness, swelling, asymmetry, or associated skin lesions. No contractures.  Skin & Extremity Inspection: Skin color, temperature, and hair growth are WNL. No peripheral edema or cyanosis. No masses, redness, swelling, asymmetry, or associated skin lesions. No contractures.  Functional ROM: Decreased ROM for hip and knee joints          Functional ROM: Decreased ROM for hip and knee joints          Muscle Tone/Strength: Functionally intact. No obvious neuro-muscular anomalies detected.  Muscle Tone/Strength: Functionally intact. No obvious neuro-muscular anomalies detected.  Sensory (Neurological): Arthropathic arthralgia        Sensory (Neurological): Articular pain pattern        DTR: Patellar: 1+: trace Achilles: deferred today Plantar: deferred today  DTR: Patellar: 1+: trace Achilles: deferred today Plantar: deferred today  Palpation: No palpable anomalies  Palpation: No palpable anomalies   Assessment  Primary Diagnosis & Pertinent Problem List: The primary encounter diagnosis was DDD (degenerative disc disease), lumbar. Diagnoses of Primary osteoarthritis of both knees, Lumbar spondylosis, History of bilateral knee replacement, Chronic bilateral low back pain without sciatica, Chronic pain syndrome, and Arthralgia of multiple joints were also pertinent to this visit.  Status Diagnosis  Persistent Persistent Controlled 1. DDD  (degenerative disc disease), lumbar   2. Primary osteoarthritis of both knees   3. Lumbar spondylosis   4. History of bilateral knee replacement   5. Chronic bilateral low back pain without sciatica   6. Chronic pain syndrome   7. Arthralgia of multiple joints      68 year old male who presents with a chief complaint of axial low back pain, right hip pain, bilateral knee pain. 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 We discussed treatment options for this which would include lumbar medial branch nerve block at L3, L4, L5 bilaterally and/or bilateral SI joint injection. Patient would like to hold off on these procedures for the time being.  Patient returns today for medication refill.  Patient states that his primary care provider discontinued his Relafen.  He states that he has been having increased pain and is wondering if he can restart this medication.  Looking [facility] at [facility] notes, seems like this medication was discontinued secondary to  the patient's kidney function.  Patient's most recent creatinine is 1.25 with a GFR of 59.  When looking at renal adjustments for Relafen, a GFR of greater than 50 is sufficient and medications do not need to be renally adjusted.  If GFR is between 30-50, recommended doses 750 mg daily to twice daily, not to exceed 1500 mg daily.  I have informed the patient that it is safe to restart this medication however at a lower dose, 500 mg and to take it once a day as needed.  Patient agreed and endorsed understanding.  Otherwise we will refill the patient's hydrocodone as below.  No changes in dose.  UDS up-to-date and appropriate.  [place] PMP checked and appropriate.   Plan of Care  Pharmacotherapy (Medications Ordered): Meds ordered this encounter  Medications  . nabumetone (RELAFEN) 500 MG tablet     Sig: Take 1 tablet (500 mg total) by mouth once as needed for mild pain.    Dispense:  90 tablet    Refill:  0  . HYDROcodone-acetaminophen (NORCO) 10-325 MG tablet    Sig: Take 1 tablet by mouth every 8 (eight) hours as needed for up to 30 days for severe pain.    Dispense:  90 tablet    Refill:  0    Do not place this medication, or any other prescription from our practice, on "Automatic Refill". Patient may have prescription filled one day early if pharmacy is closed on scheduled refill date.  [name] HYDROcodone-acetaminophen (NORCO) 10-325 MG tablet    Sig: Take 1 tablet by mouth every 8 (eight) hours as needed for up to 30 days for severe pain.    Dispense:  90 tablet    Refill:  0    Do not place this medication, or any other prescription from our practice, on "Automatic Refill". Patient may have prescription filled one day early if pharmacy is closed on scheduled refill date.  [name] HYDROcodone-acetaminophen (NORCO) 10-325 MG tablet    Sig: Take 1 tablet by mouth every 8 (eight) hours as needed for up to 30 days for severe pain.    Dispense:  90 tablet    Refill:  0    Do not place this medication, or any other prescription from our practice, on "Automatic Refill". Patient may have prescription filled one day early if pharmacy is closed on scheduled refill date.  . gabapentin (NEURONTIN) 300 MG capsule    Sig: Take 1 capsule (300 mg total) by mouth 3 (three) times daily.    Dispense:  270 capsule    Refill:  0    Considering:   Lumbar facet medial branch nerve blocks Bilateral SI joint injection Bilateral genicular nerve block   PRN Procedures:   To be determined at a later time   Provider-requested follow-up: Return in about 3 months (around 07/23/2018) for Medication Management.  Future Appointments  Date Time Provider [place]  06/13/2018  8:40 AM End, Harrell Gave, MD [facility]  06/24/2018  8:00 AM Hubbard Hartshorn, FNP [facility]  07/23/2018  9:30 AM Gillis Santa, MD [facility] None    Primary Care Physician: Hubbard Hartshorn, FNP Location: [facility] Note by: Gillis Santa, M.D Date: 04/23/2018; Time: 2:29 PM  Patient Instructions  Hydrocodone with acetaminophen to last until 08/06/2018, Relafen and Gabapentin have been escribed to your respective pharmacies.

## 2018-04-23 NOTE — Patient Instructions (Signed)
Hydrocodone with acetaminophen to last until 08/06/2018, Relafen and Gabapentin have been escribed to your respective pharmacies.

## 2018-04-23 NOTE — Chronic Care Management (AMB) (Signed)
  Chronic Care Management   Follow Up Note   04/23/2018 Name: Evan Rivas MRN: [id number] DOB: November 03, 1950  Referred by: Hubbard Hartshorn, FNP Reason for referral : Chronic Care Management (Med Assistance)  Follow up telephone outreach today for medication assistance application (Trulicity). HIPAA identifiers verified.  Currently no medication assistance updates. Etter Sjogren, CPhT, will be following up with [employer].   Patient reports tolerating Trulicity well and not experiencing side effects.   Goals Addressed            This Visit's Progress   . "I need help affording this new medication" (pt-stated)   On track    Pharmacist Clinical Goal(s): Over the next 10 days, Mr.. Harnish will provide the necessary supplementary documents (proof of out of pocket prescription expenditure, proof of household income) needed for medication assistance applications to CCM pharmacist.   Interventions: CCM pharmacist will apply for medication assistance program for Trulicity made by [employer].   CCM pharmacist educated patient on purpose, proper use, administration and potential adverse effects of Trulicity.    Patient Self Care Activities: ex. Takes medications (independent), administers insulin (in progress), checks cbg daily (independent) . Administer Trulicity once weekly as prescribed   Please see past updates related to this goal by clicking on the "Past Updates" button in the selected goal            Telephone follow up appointment with CCM team member scheduled for: 2 weeks   [name], PharmD Clinical Pharmacist [facility] [phone]

## 2018-04-25 ENCOUNTER — Ambulatory Visit: Payer: Self-pay | Admitting: Pharmacist

## 2018-04-25 ENCOUNTER — Other Ambulatory Visit: Payer: Self-pay | Admitting: Pharmacy Technician

## 2018-04-25 DIAGNOSIS — E6609 Other obesity due to excess calories: Secondary | ICD-10-CM

## 2018-04-25 DIAGNOSIS — Z6834 Body mass index (BMI) 34.0-34.9, adult: Secondary | ICD-10-CM

## 2018-04-25 DIAGNOSIS — E119 Type 2 diabetes mellitus without complications: Secondary | ICD-10-CM

## 2018-04-25 NOTE — Patient Instructions (Addendum)
Goals Addressed            This Visit's Progress   . COMPLETED: "I need help affording this new medication" (pt-stated)       Pharmacist Clinical Goal(s): Over the next 10 days, Mr.. Barthelemy will provide the necessary supplementary documents (proof of out of pocket prescription expenditure, proof of household income) needed for medication assistance applications to CCM pharmacist.   Interventions: CCM pharmacist will apply for medication assistance program for Trulicity made by [employer].   CCM pharmacist educated patient on purpose, proper use, administration and potential adverse effects of Trulicity.    Patient Self Care Activities: ex. Takes medications (independent), administers insulin (in progress), checks cbg daily (independent) . Administer Trulicity once weekly as prescribed   Please see past updates related to this goal by clicking on the "Past Updates" button in the selected goal          Congratulations! You have met all case management goals! You may call the case management team at any time should you have a question or if you have new case management needs. We are happy to help you! We will let your doctor know that you have met your goals.     Please call a member of the CCM (Chronic Care Management) Team with any questions or case management needs:   Vanetta Mulders, BSN Nurse Care Coordinator  [phone]  [name], PharmD  Clinical Pharmacist  [phone]    The patient verbalized understanding of instructions provided today and declined a print copy of patient instruction materials.

## 2018-04-25 NOTE — Patient Outreach (Signed)
[facility]) Care Management  04/25/2018  Evan Rivas 06-28-1950 [id number]    Follow up call placed to [facility] regarding patient assistance application(s) for Trulicity , Spoke to Ross who confirms patient has been aaproved as of 1/22 until 41/44/3601. 5 boxes of Trulicity is due to arrive at providers office in the next 10 business days.  Will route note to [place] to inform.  Maud Deed Chana Bode Martell Certified Pharmacy Technician [place] Management Direct Dial:[phone]

## 2018-04-25 NOTE — Chronic Care Management (AMB) (Signed)
  Chronic Care Management   Note  04/25/2018 Name: Evan Rivas MRN: [id number] DOB: June 27, 1950  Patient returning CCM pharmacist voicemail. HIPAA identifiers verified.  Evan Rivas has been approved for Trulicity from 5/53/7482 until 04/03/2019. Approval letter has been uploaded into Media tab.   Four month supply of Trulicity is expected to be received at [facility] within 10 business days. Reviewed with Evan Rivas how to refill prescription through the program - he needs to call CCM pharmacist or [facility] when he is down to 2 pens left so that [name] has time to send in new prescription to [place] and medication delivered to the [place] office. Evan Rivas understanding.   Goals Addressed            This Visit's Progress   . COMPLETED: "I need help affording this new medication" (pt-stated)       Pharmacist Clinical Goal(s): Over the next 10 days, Evan Rivas will provide the necessary supplementary documents (proof of out of pocket prescription expenditure, proof of household income) needed for medication assistance applications to CCM pharmacist.   Interventions: CCM pharmacist will apply for medication assistance program for Trulicity made by [employer].   CCM pharmacist educated patient on purpose, proper use, administration and potential adverse effects of Trulicity.    Patient Self Care Activities: ex. Takes medications (independent), administers insulin (in progress), checks cbg daily (independent) . Administer Trulicity once weekly as prescribed   Please see past updates related to this goal by clicking on the "Past Updates" button in the selected goal          Evan Rivas has met all care management goals. Review of patient status, including review of consultants reports, relevant laboratory and other test results, and collaboration with appropriate care team members and the patient's provider was performed as part of  comprehensive patient evaluation and provision of chronic care management services.  The care management team is available to Evan Rivas at any time to assist with care management needs should they arise. Mr. has been given contact information and instructions to contact the care management team with any questions or should new care management needs arise.    Follow up plan: Patient has met all care plan goals. Provided patient with CCM contact information should he have any future.    [name], PharmD Clinical Pharmacist [facility] [phone]

## 2018-04-25 NOTE — Chronic Care Management (AMB) (Signed)
  Chronic Care Management   Note  04/25/2018 Name: Evan Rivas MRN: [id number] DOB: Nov 25, 1950  Was unable to reach patient via telephone today and have left HIPAA compliant voicemail asking patient to return my call. (unsuccessful outreach #1).  Patient has been approved for Trulicity as of 06/10/4074 until 04/03/2019. [facility] will receive shipment within 10 business days.   Copy of approval letter will be scanned into patient's chart under the Media tab.  Follow up plan: The CM team will reach out to the patient again over the next 7 days.   [name], PharmD Clinical Pharmacist [facility] [phone]

## 2018-05-07 ENCOUNTER — Telehealth: Payer: Self-pay

## 2018-05-16 ENCOUNTER — Encounter: Payer: Self-pay | Admitting: Family Medicine

## 2018-05-22 ENCOUNTER — Other Ambulatory Visit: Payer: Self-pay | Admitting: Internal Medicine

## 2018-05-22 DIAGNOSIS — E269 Hyperaldosteronism, unspecified: Secondary | ICD-10-CM

## 2018-06-12 ENCOUNTER — Ambulatory Visit
Admission: RE | Admit: 2018-06-12 | Discharge: 2018-06-12 | Disposition: A | Discharge status: Home / Self Care | Payer: Medicare PPO | Source: Ambulatory Visit | Attending: Internal Medicine | Admitting: Internal Medicine

## 2018-06-12 ENCOUNTER — Other Ambulatory Visit: Payer: Self-pay

## 2018-06-12 DIAGNOSIS — E269 Hyperaldosteronism, unspecified: Secondary | ICD-10-CM | POA: Diagnosis not present

## 2018-06-13 ENCOUNTER — Ambulatory Visit: Payer: Medicare PPO | Admitting: Internal Medicine

## 2018-06-17 ENCOUNTER — Telehealth: Payer: Self-pay | Admitting: Internal Medicine

## 2018-06-17 DIAGNOSIS — I700 Atherosclerosis of aorta: Secondary | ICD-10-CM | POA: Insufficient documentation

## 2018-06-17 NOTE — Telephone Encounter (Signed)
Patient will need a refill on losartan, states that someone mentioned that losartan has been recalled, would like to know if there is another option he should take.  Please advise.   *STAT* If patient is at the pharmacy, call can be transferred to refill team.   1. Which medications need to be refilled? (please list name of each medication and dose if known)  Losartan (COZAAR) 100 MG - 1 tablet daily   2. Which pharmacy/location (including street and city if local pharmacy) is medication to be sent to? [facility]  3. Do they need a 30 day or 90 day supply? 90 day

## 2018-06-18 ENCOUNTER — Other Ambulatory Visit: Payer: Self-pay | Admitting: *Deleted

## 2018-06-18 DIAGNOSIS — I10 Essential (primary) hypertension: Secondary | ICD-10-CM

## 2018-06-18 MED ORDER — LOSARTAN POTASSIUM 100 MG PO TABS: 100.0000 mg | ORAL_TABLET | Freq: Every day | ORAL | 0 refills | Status: DC

## 2018-06-18 NOTE — Telephone Encounter (Signed)
Requested Prescriptions   Signed Prescriptions Disp Refills  . losartan (COZAAR) 100 MG tablet 90 tablet 0    Sig: Take 1 tablet (100 mg total) by mouth at bedtime.    Authorizing Provider: END, CHRISTOPHER    Ordering User: Britt Bottom

## 2018-06-19 NOTE — Telephone Encounter (Signed)
I spoke with the patient. I advised him that not all losartan has been recalled. I have also advised him if his particular manufacturer is effected by the recall his pharmacy should contact him directly to notify him of this. He is aware that we try not to switch the medication unless his pharmacy has notified him that his particular losartan has been effected.   The patient voices understanding and is agreeable.  He is aware that his RX refill for losartan was sent to [facility] on 3/17 for a 90-day supply.   The patient was appreciative for the call back.

## 2018-06-20 ENCOUNTER — Other Ambulatory Visit: Payer: Self-pay

## 2018-06-20 ENCOUNTER — Encounter: Payer: Self-pay | Admitting: Family Medicine

## 2018-06-20 ENCOUNTER — Ambulatory Visit: Payer: Medicare PPO | Admitting: Family Medicine

## 2018-06-20 VITALS — BP 126/76 | HR 89 | Temp 98.2°F | Resp 18 | Ht 74.0 in | Wt 275.6 lb

## 2018-06-20 DIAGNOSIS — I10 Essential (primary) hypertension: Secondary | ICD-10-CM | POA: Diagnosis not present

## 2018-06-20 DIAGNOSIS — F331 Major depressive disorder, recurrent, moderate: Secondary | ICD-10-CM

## 2018-06-20 DIAGNOSIS — G8929 Other chronic pain: Secondary | ICD-10-CM

## 2018-06-20 DIAGNOSIS — M545 Low back pain, unspecified: Secondary | ICD-10-CM

## 2018-06-20 DIAGNOSIS — F1120 Opioid dependence, uncomplicated: Secondary | ICD-10-CM

## 2018-06-20 DIAGNOSIS — E66812 Obesity, class 2: Secondary | ICD-10-CM

## 2018-06-20 DIAGNOSIS — G4733 Obstructive sleep apnea (adult) (pediatric): Secondary | ICD-10-CM

## 2018-06-20 DIAGNOSIS — Z6835 Body mass index (BMI) 35.0-35.9, adult: Secondary | ICD-10-CM

## 2018-06-20 DIAGNOSIS — F419 Anxiety disorder, unspecified: Secondary | ICD-10-CM

## 2018-06-20 DIAGNOSIS — E7800 Pure hypercholesterolemia, unspecified: Secondary | ICD-10-CM

## 2018-06-20 DIAGNOSIS — M1A0720 Idiopathic chronic gout, left ankle and foot, without tophus (tophi): Secondary | ICD-10-CM

## 2018-06-20 DIAGNOSIS — G4719 Other hypersomnia: Secondary | ICD-10-CM

## 2018-06-20 DIAGNOSIS — N400 Enlarged prostate without lower urinary tract symptoms: Secondary | ICD-10-CM

## 2018-06-20 DIAGNOSIS — M51369 Other intervertebral disc degeneration, lumbar region without mention of lumbar back pain or lower extremity pain: Secondary | ICD-10-CM

## 2018-06-20 DIAGNOSIS — M5136 Other intervertebral disc degeneration, lumbar region: Secondary | ICD-10-CM

## 2018-06-20 DIAGNOSIS — E269 Hyperaldosteronism, unspecified: Secondary | ICD-10-CM | POA: Diagnosis not present

## 2018-06-20 DIAGNOSIS — R600 Localized edema: Secondary | ICD-10-CM

## 2018-06-20 DIAGNOSIS — I5189 Other ill-defined heart diseases: Secondary | ICD-10-CM

## 2018-06-20 DIAGNOSIS — E119 Type 2 diabetes mellitus without complications: Secondary | ICD-10-CM | POA: Diagnosis not present

## 2018-06-20 MED ORDER — TAMSULOSIN HCL 0.4 MG PO CAPS: 0.4000 mg | ORAL_CAPSULE | Freq: Every day | ORAL | 1 refills | Status: DC

## 2018-06-20 MED ORDER — FLUOXETINE HCL 40 MG PO CAPS: 40.0000 mg | ORAL_CAPSULE | Freq: Every day | ORAL | 1 refills | Status: DC

## 2018-06-20 MED ORDER — GLIPIZIDE 10 MG PO TABS: 10.0000 mg | ORAL_TABLET | Freq: Every day | ORAL | 1 refills | Status: DC

## 2018-06-20 MED ORDER — TIZANIDINE HCL 4 MG PO TABS: 4.0000 mg | ORAL_TABLET | Freq: Every day | ORAL | 1 refills | Status: DC

## 2018-06-20 MED ORDER — HYDROXYZINE HCL 10 MG PO TABS: 10.0000 mg | ORAL_TABLET | Freq: Three times a day (TID) | ORAL | 1 refills | Status: DC | PRN

## 2018-06-20 MED ORDER — METOPROLOL SUCCINATE ER 100 MG PO TB24: 100.0000 mg | ORAL_TABLET | ORAL | 1 refills | Status: DC

## 2018-06-20 MED ORDER — ATORVASTATIN CALCIUM 40 MG PO TABS: 40.0000 mg | ORAL_TABLET | Freq: Every day | ORAL | 1 refills | Status: DC

## 2018-06-20 NOTE — Patient Instructions (Addendum)
Call [name] and advise have been off of Spironolactone for your adrenal adenoma procedure, but this has been pushed back.  Should you restart?  Switch Metoprolol to taking at night. Message me in about 1-2 months and let me know if you are getting better with your daytime sleepiness.

## 2018-06-20 NOTE — Progress Notes (Signed)
Established Patient Office Visit  Subjective:  Patient ID: Evan Rivas, male    DOB: 26-Dec-1950  Age: 68 y.o. MRN: [id number]  CC:  Chief Complaint  Patient presents with  . Diabetes    follow up  . Hyperlipidemia  . Hypertension    HPI Evan Rivas presents for   Hyperaldosteronism:  States he saw endocrinologist on Monday and they are now doing further testing to rule out tumors on his adrenal glands since blood sugars are not responding well to treatment. He was supposed to go to [facility] for a procedure on Monday but it was cancelled due to the covid 19 pandemic. He states that due to his aldosterone levels being so erratic, they are concerned about other causes.  DM: Was referred to endocrinology for DM, but he has not had A1C checked by [name].  Lowest BG has been 70's, highest has been 225.  He is taking trulicity, metformin, and still taking glipizide 10mg  once daily.  Denies polydipsia, polyphagia, or polyuria. Needs A1C and Lipid panel today.  HTN:  WNL in office today, however patient checks BP at home daily and has consistent readings of 140s/90s-180s/100s - recommend he bring cuff to next visit for calibration.  -doestake medications as prescribed, denies any missed doses. Is followed by cardiology for management.   -taking medications as instructed, no medication side effects noted, no TIAs, no chest pain on exertion, no dyspnea on exertion.- DASH diet discussed - ptdoes notfollow a low sodium diet; eats diet heavy in carbs, processed foods but does report trying to bake most meats.  - The followingarecontributing factors: sedentary lifestyle, saturated fats in diet, high sodium diet  Obesity: Body mass index is 35.38 kg/m. Weight Management History: difficulty managing weight even when he follows low carb diet and exercises. Diet:heavy in carbs and processed foods, sugars. States he sometimes overeats but tries to manage portions as much as  possible.  Exercise:not active currently Co-Morbid Conditions:diabetes mellitus, dyslipidemias and hypertension; 2 or more of these conditions combined with BMI >30 is considered morbid obesity; is this diagnosis appropriate and/or added to patient's problem list?Yes Unchanged since last visit  Chronic Pain and Opioid Dependence: He is seeing Dr. [name] for management. Still taking gabapentin 300mg  TID and Norco,Relafen daily which is prescribed by our office. Discussed stopping relafen at last visit r/t elevated creatinine but Dr. [name] advised it would be safe for him to continue it once daily for now.  Patient asks for Rx's from our office and to see Dr. [name] annually as cost of visits has been an issue recently.   BLE Edema/Diastolic Dysfunction: Seeing Dr. [name] and [name] for management now - has been off of sprionolactone that was prescribed by them since 04/05/18 r/t procedure he was scheduled to have at [facility] on Monday.  He will call [name] for instruction to restart medication.  Was scheduled to see cards Wednesday for follow up after starting spironolactone but cancelled due to having stopped it for endocrinology procedure. Edema is minimal as long as he wears compression stockings which he wears 6 days a week for atleast 10 hours at a time. Leaves them off one day/week to "give legs a break".  No chest pain or shortness of breath.  Depression and Anxiety: Currently on prozac 40mg  and doing well. States anxiety is normally well controlled, but reports an increase in it recently due to his wife being out of work and increasing medical bills. Has hydroxyzine prn  for anxiety, Is taking it about every other day which is working well for him - we will refill today.  We discussed possible sedative effects, and he verbalizes understanding.  Daytime Sleepiness/OSA: still dealing with excessive daytime drowsiness.  Using cpap nightly now for sleep apnea, started on 04/25/18; has not seen a  decrease in daytime drowsiness since using this -Using nasal mask instead of face mask, feels like he wakes up often due to being a mouth breather and being concerned he is not breathing through his nose.  Recommend trailing donut pillow to help with discomfort and adjusting positioning in bed. -Endocrinology performing further testing to determine if there is an underlying cause related to adrenal glands. - Seeing Dr. Ashby Dawes for follow up on OSA.  Discussed armodafonil with the patient, but unfortunately, it is cost prohibitive at this time.  BPH: Continues to have no issues with nocturia. Doing well on flomax, no concerns today. Stable and unchanged  Gout: Taking Allopurinol daily, no recent flares.  Past Medical History:  Diagnosis Date  . Anxiety    panic attacks  . Arthritis   . Cancer (HCC)    lip  . Depression   . Diabetes mellitus without complication ([place])    type 2  . GERD (gastroesophageal reflux disease)   . Gout   . History of kidney stones   . Hyperlipidemia   . Hypertension   . Pneumonia    2014 and 2018 ( 2018-klebsiella pneumonia     Past Surgical History:  Procedure Laterality Date  . ARTHROSCOPIC REPAIR ACL Left   . CYSTOSCOPY W/ URETERAL STENT PLACEMENT Right 09/07/2014   Procedure: CYSTOSCOPY WITH STENT REPLACEMENT;  Surgeon: Hollice Espy, MD;  Location: [facility];  Service: Urology;  Laterality: Right;  . CYSTOSCOPY/URETEROSCOPY/HOLMIUM LASER/STENT PLACEMENT Right 08/17/2014   Procedure: CYSTOSCOPY/URETEROSCOPY//STENT PLACEMENT/ attempt of lithotripsy;  Surgeon: Hollice Espy, MD;  Location: [facility];  Service: Urology;  Laterality: Right;  . HERNIA REPAIR    . MOHS SURGERY     lip  . TOTAL KNEE ARTHROPLASTY Left 04/24/2016   Procedure: LEFT TOTAL KNEE ARTHROPLASTY;  Surgeon: Gaynelle Arabian, MD;  Location: [facility];  Service: Orthopedics;  Laterality: Left;  Adductor Block  . TOTAL KNEE ARTHROPLASTY Right 10/23/2016   Procedure: RIGHT TOTAL KNEE  ARTHROPLASTY;  Surgeon: Gaynelle Arabian, MD;  Location: [facility];  Service: Orthopedics;  Laterality: Right;  . URETEROSCOPY WITH HOLMIUM LASER LITHOTRIPSY Right 09/07/2014   Procedure: URETEROSCOPY WITH HOLMIUM LASER LITHOTRIPSY;  Surgeon: Hollice Espy, MD;  Location: [facility];  Service: Urology;  Laterality: Right;  [name] VASECTOMY      Family History  Problem Relation Age of Onset  . Hypertension Mother   . Cancer Mother   . Hyperlipidemia Mother   . Stroke Father   . Heart disease Father   . Depression Sister   . Hypertension Sister   . Hyperlipidemia Sister     Social History   Socioeconomic History  . Marital status: Married    Spouse name: Hoyle Sauer  . Number of children: 1  . Years of education: Not on file  . Highest education level: Not on file  Occupational History  . Occupation: retired  Scientific laboratory technician  . Financial resource strain: Not hard at all  . Food insecurity:    Worry: Never true    Inability: Never true  . Transportation needs:    Medical: No    Non-medical: No  Tobacco Use  . Smoking status: Never Smoker  .  Smokeless tobacco: Never Used  Substance and Sexual Activity  . Alcohol use: No  . Drug use: No  . Sexual activity: Yes  Lifestyle  . Physical activity:    Days per week: 4 days    Minutes per session: 60 min  . Stress: Only a little  Relationships  . Social connections:    Talks on phone: More than three times a week    Gets together: More than three times a week    Attends religious service: 1 to 4 times per year    Active member of club or organization: Yes    Attends meetings of clubs or organizations: 1 to 4 times per year    Relationship status: Married  . Intimate partner violence:    Fear of current or ex partner: No    Emotionally abused: No    Physically abused: Not on file    Forced sexual activity: No  Other Topics Concern  . Not on file  Social History Narrative  . Not on file    Outpatient Medications Prior to Visit   Medication Sig Dispense Refill  . allopurinol (ZYLOPRIM) 300 MG tablet Take 1 tablet (300 mg total) by mouth every evening. 90 tablet 1  . ASPIRIN 81 PO Take 81 mg by mouth daily.     . Dulaglutide (TRULICITY) 4.74 QV/9.5GL SOPN Inject 0.75 mLs into the skin once a week. 2 mL 3  . fluticasone (FLONASE) 50 MCG/ACT nasal spray Place 2 sprays into both nostrils daily. 2 sprays 16 g 6  . gabapentin (NEURONTIN) 300 MG capsule Take 1 capsule (300 mg total) by mouth 3 (three) times daily. 270 capsule 0  . glucose blood (ACCU-CHEK AVIVA PLUS) test strip Use as instructed 100 each 12  . hydrochlorothiazide (HYDRODIURIL) 25 MG tablet Take 1 tablet (25 mg total) by mouth daily. 90 tablet 1  . HYDROcodone-acetaminophen (NORCO) 10-325 MG tablet Take 1 tablet by mouth every 8 (eight) hours as needed for up to 30 days for severe pain. 90 tablet 0  . [START ON 07/07/2018] HYDROcodone-acetaminophen (NORCO) 10-325 MG tablet Take 1 tablet by mouth every 8 (eight) hours as needed for up to 30 days for severe pain. 90 tablet 0  . losartan (COZAAR) 100 MG tablet Take 1 tablet (100 mg total) by mouth at bedtime. 90 tablet 0  . metFORMIN (GLUCOPHAGE XR) 750 MG 24 hr tablet Take 2 tablets (1,500 mg total) by mouth daily with breakfast. 14 tablet 0  . metoprolol succinate (TOPROL-XL) 25 MG 24 hr tablet Take 2 tablets (50 mg total) by mouth every morning. 180 tablet 3  . nabumetone (RELAFEN) 500 MG tablet Take 1 tablet (500 mg total) by mouth once as needed for mild pain. 90 tablet 0  . spironolactone (ALDACTONE) 25 MG tablet Take 2 tablets (50 mg total) by mouth daily. 90 tablet 3  . atorvastatin (LIPITOR) 40 MG tablet Take 1 tablet (40 mg total) by mouth at bedtime. 90 tablet 1  . FLUoxetine (PROZAC) 40 MG capsule Take 1 capsule (40 mg total) by mouth daily. 90 capsule 1  . glipiZIDE (GLUCOTROL) 10 MG tablet Take 1 tablet (10 mg total) by mouth daily before breakfast. 90 tablet 1  . glipiZIDE (GLUCOTROL) 5 MG tablet Take 1  tablet (5 mg total) by mouth every evening. With dinner 90 tablet 1  . hydrOXYzine (ATARAX/VISTARIL) 10 MG tablet Take 1 tablet (10 mg total) by mouth every 8 (eight) hours as needed for anxiety. 30 tablet  0  . metoprolol succinate (TOPROL-XL) 100 MG 24 hr tablet Take 1 tablet (100 mg total) by mouth every morning. 90 tablet 0  . tamsulosin (FLOMAX) 0.4 MG CAPS capsule Take 1 capsule (0.4 mg total) by mouth daily after supper. 90 capsule 0  . tiZANidine (ZANAFLEX) 4 MG tablet Take 1 tablet (4 mg total) by mouth at bedtime. 90 tablet 0  . amLODipine (NORVASC) 5 MG tablet Take 1 tablet (5 mg total) by mouth daily. 90 tablet 3   No facility-administered medications prior to visit.     Allergies  Allergen Reactions  . Celebrex  [Celecoxib]     GI  . Tetanus Toxoids Other (See Comments)    unkown  . Tetanus-Diphtheria Toxoids Td     Other reaction(s): Unknown    ROS Review of Systems  Constitutional: Negative.   HENT: Negative.   Eyes: Negative.   Respiratory: Positive for shortness of breath. Negative for chest tightness.   Cardiovascular: Positive for chest pain. Negative for palpitations.  Gastrointestinal: Negative.   Endocrine: Negative.   Genitourinary: Negative.   Musculoskeletal: Positive for back pain.  Skin: Negative.   Allergic/Immunologic: Negative.   Neurological: Negative.  Negative for headaches.  Hematological: Negative.   Psychiatric/Behavioral: Positive for sleep disturbance. The patient is nervous/anxious.      Objective:    Physical Exam  Constitutional: He is oriented to person, place, and time. He appears well-developed and well-nourished.  HENT:  Head: Normocephalic and atraumatic.  Eyes: Pupils are equal, round, and reactive to light.  Neck: Normal range of motion. Neck supple.  Cardiovascular: Normal rate, regular rhythm and normal heart sounds.  Pulmonary/Chest: Effort normal and breath sounds normal.  Abdominal: Soft. Bowel sounds are normal.   Musculoskeletal: Normal range of motion.        General: No edema.  Neurological: He is alert and oriented to person, place, and time. He has normal reflexes.  Skin: Skin is warm and dry.  Psychiatric: He has a normal mood and affect. His behavior is normal. Judgment and thought content normal.   BP 126/76 (BP Location: Right Arm, Patient Position: Sitting, Cuff Size: Large)   Pulse 89   Temp 98.2 F (36.8 C) (Oral)   Resp 18   Ht 6\' 2"  (1.88 m)   Wt 275 lb 9.6 oz (125 kg)   SpO2 94%   BMI 35.38 kg/m  Wt Readings from Last 3 Encounters:  06/20/18 275 lb 9.6 oz (125 kg)  04/23/18 270 lb (122.5 kg)  03/25/18 269 lb 8 oz (122.2 kg)     Health Maintenance Due  Topic Date Due  . FOOT EXAM  05/08/2018    There are no preventive care reminders to display for this patient.  No results found for: TSH Lab Results  Component Value Date   WBC 5.9 04/24/2017   HGB 14.4 04/24/2017   HCT 42.7 04/24/2017   MCV 93 04/24/2017   PLT 216 04/24/2017   Lab Results  Component Value Date   NA 144 03/12/2018   K 4.0 03/12/2018   CO2 22 03/12/2018   GLUCOSE 202 (H) 03/12/2018   BUN 28 (H) 03/12/2018   CREATININE 1.25 03/12/2018   BILITOT 0.3 09/17/2017   ALKPHOS 84 09/17/2017   AST 13 09/17/2017   ALT 16 09/17/2017   PROT 6.6 09/17/2017   ALBUMIN 4.0 09/17/2017   CALCIUM 9.5 03/12/2018   ANIONGAP 12 03/04/2018   Lab Results  Component Value Date   CHOL 140 09/17/2017  Lab Results  Component Value Date   HDL 32 (L) 09/17/2017   Lab Results  Component Value Date   LDLCALC 80 09/17/2017   Lab Results  Component Value Date   TRIG 141 09/17/2017   Lab Results  Component Value Date   CHOLHDL 3.8 06/03/2015   Lab Results  Component Value Date   HGBA1C 8.9 (A) 03/25/2018   HGBA1C 8.9 03/25/2018   HGBA1C 8.9 (A) 03/25/2018   HGBA1C 8.9 (A) 03/25/2018      Assessment & Plan:   Problem List Items Addressed This Visit      Cardiovascular and Mediastinum    Essential hypertension   Relevant Medications   atorvastatin (LIPITOR) 40 MG tablet   metoprolol succinate (TOPROL-XL) 100 MG 24 hr tablet     Respiratory   OSA (obstructive sleep apnea)    Continue follow up with Dr. Ashby Dawes, armodafonil is considered, but is cost prohibitive for the patient right now.        Endocrine   Type 2 diabetes mellitus without complication ([place]) - Primary    Labs ordered as [name] did not check at last visit.      Relevant Medications   atorvastatin (LIPITOR) 40 MG tablet   glipiZIDE (GLUCOTROL) 10 MG tablet   Other Relevant Orders   Hemoglobin A1c   Hyperaldosteronism (HCC)    Continue with [name], follow up with her regarding spironolactone.        Musculoskeletal and Integument   DDD (degenerative disc disease), lumbar   Relevant Medications   tiZANidine (ZANAFLEX) 4 MG tablet     Genitourinary   BPH without urinary obstruction   Relevant Medications   tamsulosin (FLOMAX) 0.4 MG CAPS capsule     Other   Anxiety   Relevant Medications   hydrOXYzine (ATARAX/VISTARIL) 10 MG tablet   FLUoxetine (PROZAC) 40 MG capsule   Continuous opioid dependence ([place])    Will reach out to Dr. [name] to discuss case and possibly assuming care of the patient.      Gout   Morbid obesity ([place])    Discussed importance of 150 minutes of physical activity weekly, eat two servings of fish weekly, eat one serving of tree nuts ( cashews, pistachios, pecans, almonds.[name]) every other day, eat 6 servings of fruit/vegetables daily and drink plenty of water and avoid sweet beverages.       Relevant Medications   glipiZIDE (GLUCOTROL) 10 MG tablet   Hyperlipidemia   Relevant Medications   atorvastatin (LIPITOR) 40 MG tablet   metoprolol succinate (TOPROL-XL) 100 MG 24 hr tablet   Other Relevant Orders   Lipid panel   Major depressive disorder, recurrent episode, moderate (HCC)   Relevant Medications   hydrOXYzine (ATARAX/VISTARIL) 10 MG tablet    FLUoxetine (PROZAC) 40 MG capsule   Chronic low back pain   Relevant Medications   tiZANidine (ZANAFLEX) 4 MG tablet   Lower extremity edema   Diastolic dysfunction   Excessive daytime sleepiness     Meds ordered this encounter  Medications  . hydrOXYzine (ATARAX/VISTARIL) 10 MG tablet    Sig: Take 1 tablet (10 mg total) by mouth every 8 (eight) hours as needed for anxiety.    Dispense:  30 tablet    Refill:  1    Order Specific Question:   Supervising Provider    Answer:   Steele Sizer [[id number]]  . tiZANidine (ZANAFLEX) 4 MG tablet    Sig: Take 1 tablet (4 mg total) by mouth at  bedtime.    Dispense:  90 tablet    Refill:  1    Order Specific Question:   Supervising Provider    Answer:   Steele Sizer [[id number]]  . atorvastatin (LIPITOR) 40 MG tablet    Sig: Take 1 tablet (40 mg total) by mouth at bedtime.    Dispense:  90 tablet    Refill:  1    Order Specific Question:   Supervising Provider    Answer:   Steele Sizer [[id number]]  . metoprolol succinate (TOPROL-XL) 100 MG 24 hr tablet    Sig: Take 1 tablet (100 mg total) by mouth every morning.    Dispense:  90 tablet    Refill:  1    Order Specific Question:   Supervising Provider    Answer:   Steele Sizer [[id number]]  . FLUoxetine (PROZAC) 40 MG capsule    Sig: Take 1 capsule (40 mg total) by mouth daily.    Dispense:  90 capsule    Refill:  1    Order Specific Question:   Supervising Provider    Answer:   Steele Sizer [[id number]]  . tamsulosin (FLOMAX) 0.4 MG CAPS capsule    Sig: Take 1 capsule (0.4 mg total) by mouth daily after supper.    Dispense:  90 capsule    Refill:  1    Order Specific Question:   Supervising Provider    Answer:   Steele Sizer [[id number]]  . glipiZIDE (GLUCOTROL) 10 MG tablet    Sig: Take 1 tablet (10 mg total) by mouth daily before breakfast.    Dispense:  90 tablet    Refill:  1    Order Specific Question:   Supervising Provider    Answer:   Steele Sizer [[id number]]   Follow-up: Return in about  3 months (around 09/20/2018) for AWV with [facility] LPN and Follow Up with Raquel Sarna on same day.   Hubbard Hartshorn, FNP    Addendum on 06/24/2018 -  - Reached out to Dr. [name], also spoke with attending physician here at [facility] - [name] to review case and pain management.  I will take over norco Rx as long as patient is agreeable to a slow weaning of the medication. Spoke with Mr. Ruppel via telephone at 9:35am on 06/24/2018 to discuss.  He will trial alternating days of 1 tablet every 8 hours, then 1 tablet twice daily and 1/2 tablet once daily (8 hour increments) x1-2 weeks, then come down to daily regimen of 1 tablet every 8 hours and 1/2 tablet once daily (8 hour increment).  He is agreeable to trial this, and if tolerable, will call our office to schedule a follow up to establish pain contract, UDS, and for refills.

## 2018-06-24 ENCOUNTER — Ambulatory Visit: Payer: Medicare PPO | Admitting: Family Medicine

## 2018-06-24 DIAGNOSIS — G4733 Obstructive sleep apnea (adult) (pediatric): Secondary | ICD-10-CM | POA: Insufficient documentation

## 2018-06-24 DIAGNOSIS — G4719 Other hypersomnia: Secondary | ICD-10-CM | POA: Insufficient documentation

## 2018-06-24 DIAGNOSIS — E269 Hyperaldosteronism, unspecified: Secondary | ICD-10-CM | POA: Insufficient documentation

## 2018-06-24 DIAGNOSIS — I5189 Other ill-defined heart diseases: Secondary | ICD-10-CM | POA: Insufficient documentation

## 2018-06-24 NOTE — Assessment & Plan Note (Signed)
Discussed importance of 150 minutes of physical activity weekly, eat two servings of fish weekly, eat one serving of tree nuts ( cashews, pistachios, pecans, almonds..) every other day, eat 6 servings of fruit/vegetables daily and drink plenty of water and avoid sweet beverages. 

## 2018-06-24 NOTE — Assessment & Plan Note (Signed)
Continue with [name], follow up with her regarding spironolactone.

## 2018-06-24 NOTE — Assessment & Plan Note (Signed)
Continue follow up with Dr. Ashby Dawes, armodafonil is considered, but is cost prohibitive for the patient right now.

## 2018-06-24 NOTE — Assessment & Plan Note (Signed)
Will reach out to Dr. [name] to discuss case and possibly assuming care of the patient.

## 2018-06-24 NOTE — Assessment & Plan Note (Signed)
Labs ordered as [name] did not check at last visit.

## 2018-06-29 LAB — HEMOGLOBIN A1C
Hgb A1c MFr Bld: 8.1 % of total Hgb — ABNORMAL HIGH (ref <5.7)
Mean Plasma Glucose: 186 (calc)
eAG (mmol/L): 10.3 (calc)

## 2018-06-29 LAB — LIPID PANEL
Cholesterol: 147 mg/dL (ref <200)
HDL: 32 mg/dL — ABNORMAL LOW (ref >=40)
LDL Cholesterol (Calc): 78 mg/dL (calc)
Non-HDL Cholesterol (Calc): 115 mg/dL (calc) (ref <130)
TRIGLYCERIDES: 305 mg/dL — AB (ref <150)
Total CHOL/HDL Ratio: 4.6 (calc) (ref <5.0)

## 2018-07-01 ENCOUNTER — Other Ambulatory Visit: Payer: Self-pay | Admitting: Family Medicine

## 2018-07-01 ENCOUNTER — Telehealth: Payer: Self-pay | Admitting: Family Medicine

## 2018-07-01 DIAGNOSIS — E119 Type 2 diabetes mellitus without complications: Secondary | ICD-10-CM

## 2018-07-01 NOTE — Telephone Encounter (Signed)
Error

## 2018-07-08 ENCOUNTER — Telehealth: Payer: Self-pay

## 2018-07-08 NOTE — Telephone Encounter (Signed)
His prescription is at [facility] on [address] and they are closed and say they cannot send the script to another [facility]. Can Dr. [name] call in a new script for the [address] phone number [phone]

## 2018-07-09 NOTE — Telephone Encounter (Signed)
He was able to get his scripts and doesn't need for you to call in another one. Called back 07/09/18 and disregard yesterdays message.

## 2018-07-23 ENCOUNTER — Encounter: Payer: Medicare PPO | Admitting: Student in an Organized Health Care Education/Training Program

## 2018-07-24 ENCOUNTER — Ambulatory Visit: Payer: Self-pay | Admitting: Pharmacist

## 2018-07-24 DIAGNOSIS — E7800 Pure hypercholesterolemia, unspecified: Secondary | ICD-10-CM

## 2018-07-24 DIAGNOSIS — E119 Type 2 diabetes mellitus without complications: Secondary | ICD-10-CM

## 2018-07-24 DIAGNOSIS — Z6835 Body mass index (BMI) 35.0-35.9, adult: Secondary | ICD-10-CM

## 2018-07-24 IMAGING — DX DG CHEST 2V
2 series · 2 of 2 positions shown · non-contrast
Comparison: July 19, 2016

CLINICAL DATA: Hypertension.  Preoperative total knee replacement

EXAM:
CHEST  2 VIEW

[chest pa]
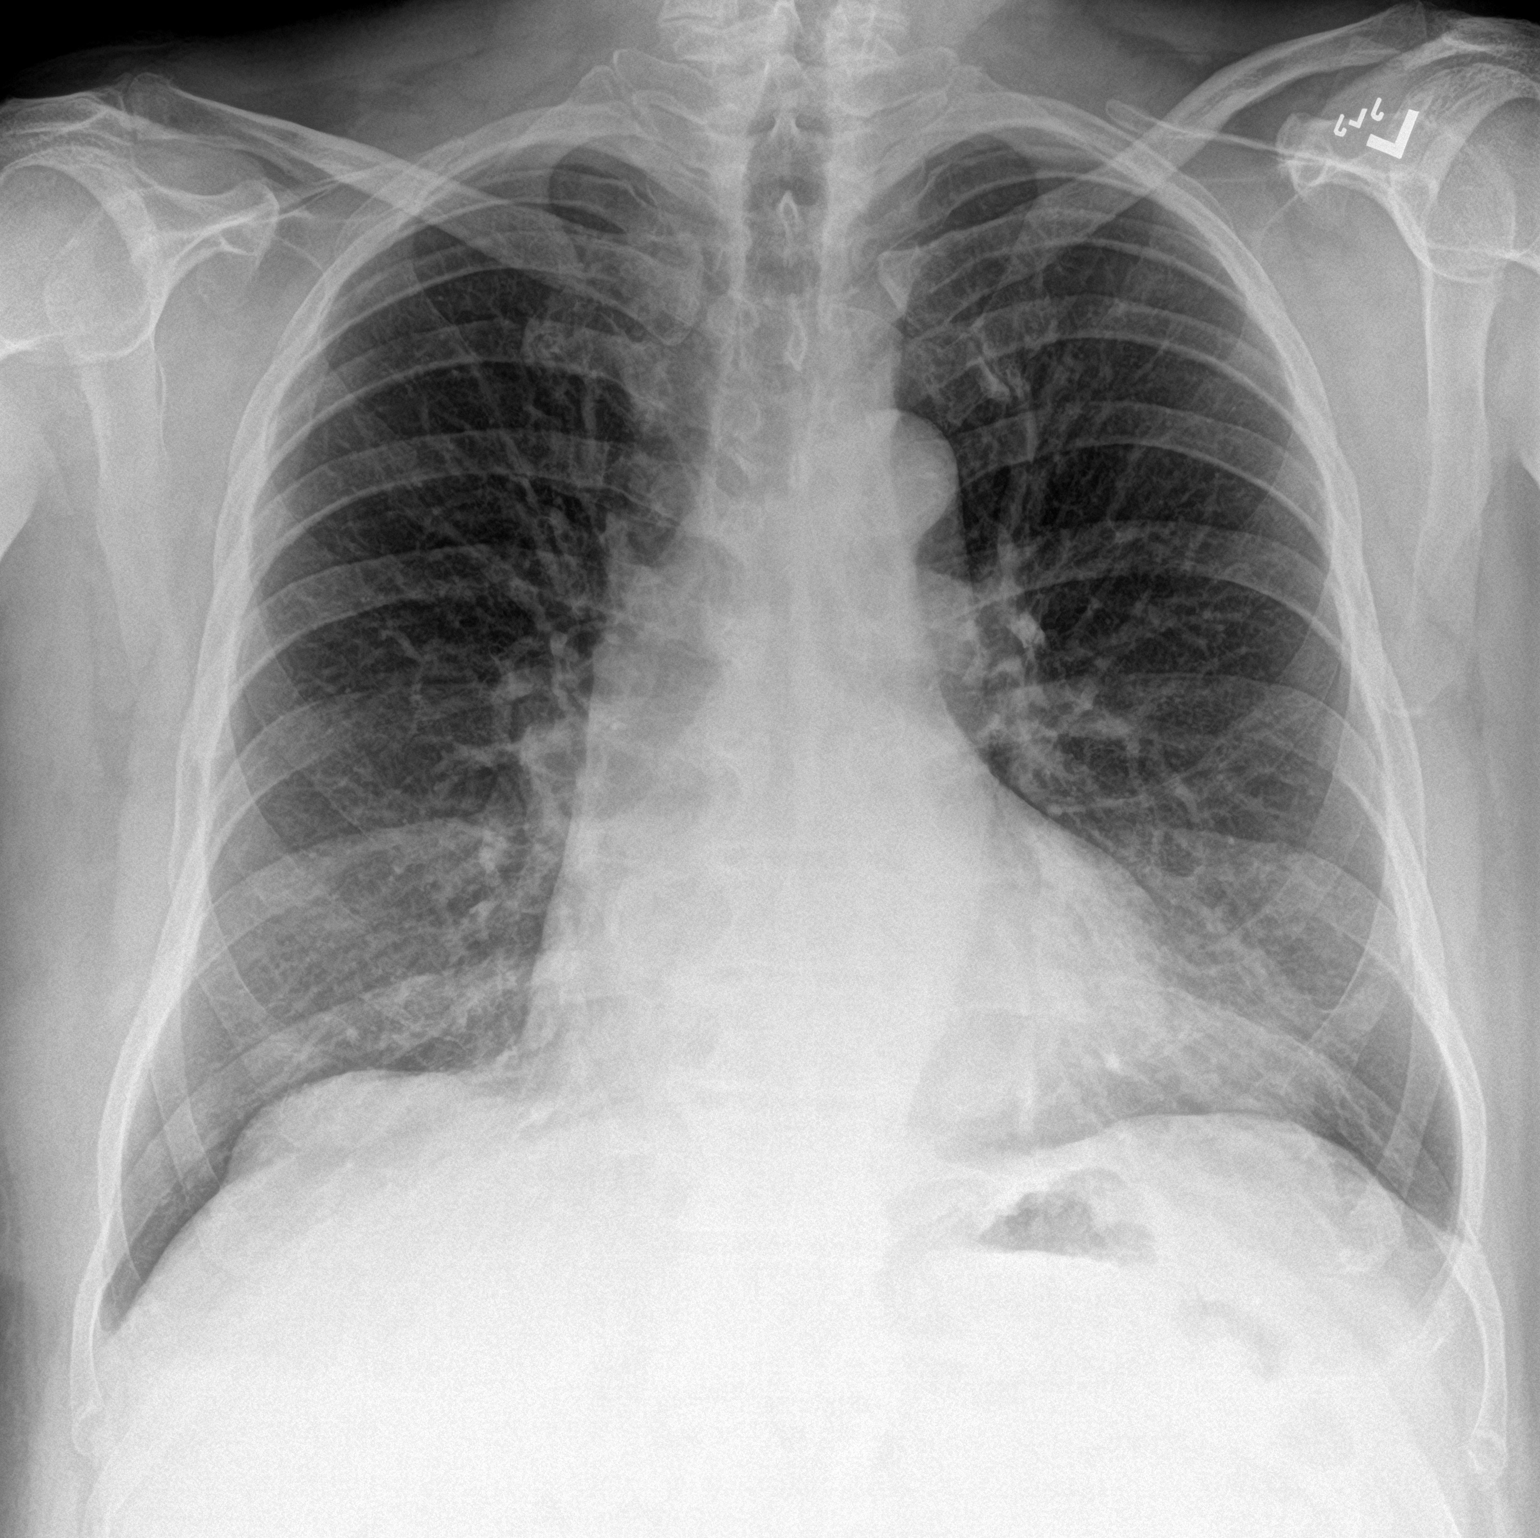

[chest lat]
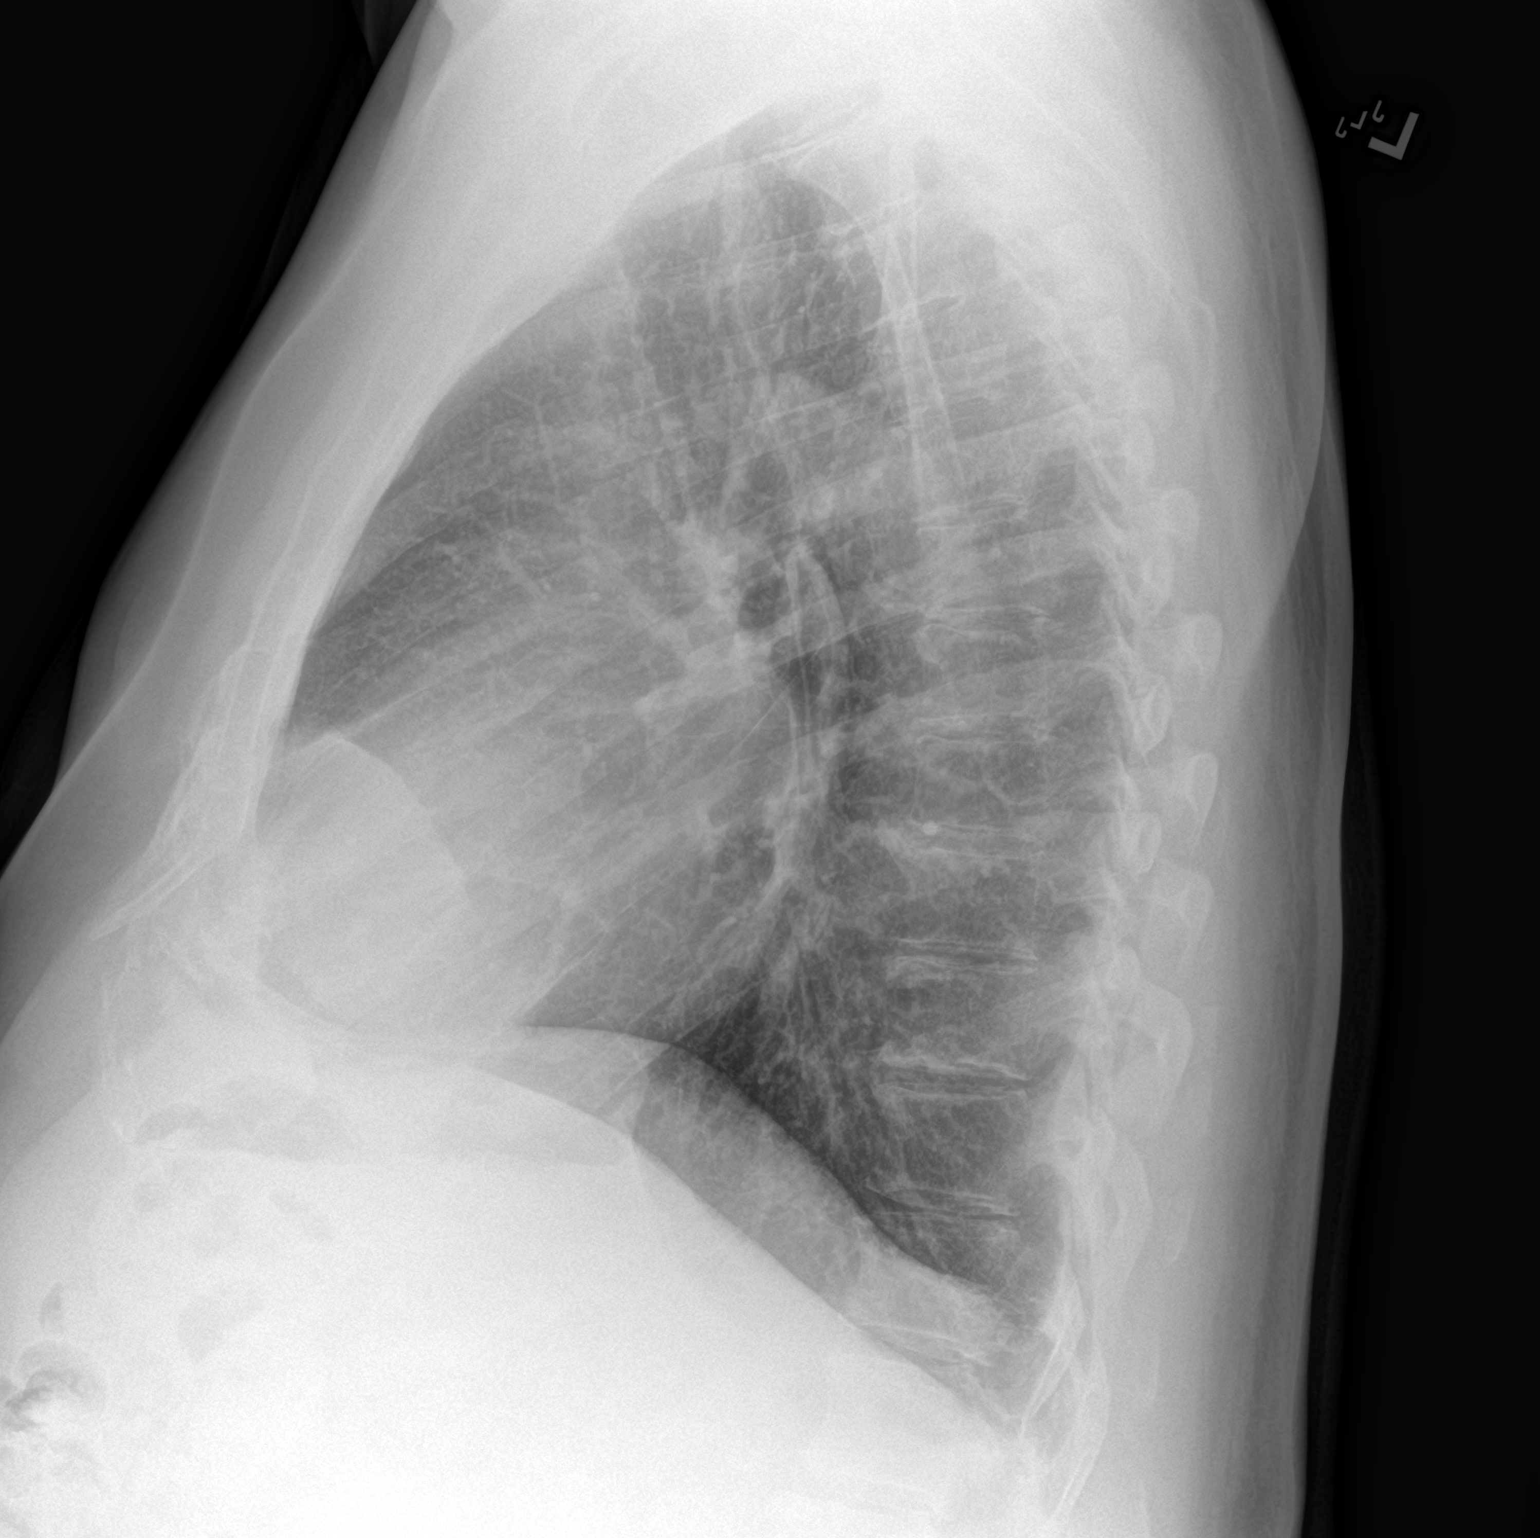

[2 of 2 positions shown; findings below may reference images not displayed]

FINDINGS: There is no appreciable edema or consolidation. The heart size and
pulmonary vascularity are normal. No adenopathy. There is mild
degenerative change in the thoracic spine.
IMPRESSION: No edema or consolidation.

## 2018-07-24 NOTE — Chronic Care Management (AMB) (Signed)
  Chronic Care Management   Note  07/24/2018 Name: Evan Rivas MRN: [id number] DOB: 1950/06/10  [name] faxed provider [name], FNP with refill request for Trulicity. Patient has been approved for Trulicity medication assistance through [employer]. The program now uses [facility] mail order pharmacy to fill orders.   Completed prescription request should be submitted to both [employer] and [facility]. Fax numbers provided on form.   Trulicity will be mailed directly to patient.   Follow Up Plan: No further follow up required: provided [name] with prescription paperwork that needs signature  [name], PharmD Clinical Pharmacist [facility] [phone]

## 2018-07-30 ENCOUNTER — Other Ambulatory Visit: Payer: Self-pay

## 2018-07-30 ENCOUNTER — Ambulatory Visit (INDEPENDENT_AMBULATORY_CARE_PROVIDER_SITE_OTHER): Payer: Medicare PPO | Admitting: Family Medicine

## 2018-07-30 ENCOUNTER — Encounter: Payer: Self-pay | Admitting: Family Medicine

## 2018-07-30 DIAGNOSIS — M5136 Other intervertebral disc degeneration, lumbar region: Secondary | ICD-10-CM | POA: Diagnosis not present

## 2018-07-30 DIAGNOSIS — F1120 Opioid dependence, uncomplicated: Secondary | ICD-10-CM | POA: Diagnosis not present

## 2018-07-30 DIAGNOSIS — M170 Bilateral primary osteoarthritis of knee: Secondary | ICD-10-CM

## 2018-07-30 MED ORDER — NALOXONE HCL 0.4 MG/0.4ML IJ SOAJ: INTRAMUSCULAR | 1 refills | Status: DC

## 2018-07-30 NOTE — Patient Instructions (Signed)
Low Back Sprain Rehab Ask your health care provider which exercises are safe for you. Do exercises exactly as told by your health care provider and adjust them as directed. It is normal to feel mild stretching, pulling, tightness, or discomfort as you do these exercises, but you should stop right away if you feel sudden pain or your pain gets worse. Do not begin these exercises until told by your health care provider. Stretching and range of motion exercises These exercises warm up your muscles and joints and improve the movement and flexibility of your back. These exercises also help to relieve pain, numbness, and tingling. Exercise A: Lumbar rotation  1. Lie on your back on a firm surface and bend your knees. 2. Straighten your arms out to your sides so each arm forms an "L" shape with a side of your body (a 90 degree angle). 3. Slowly move both of your knees to one side of your body until you feel a stretch in your lower back. Try not to let your shoulders move off of the floor. 4. Hold for __________ seconds. 5. Tense your abdominal muscles and slowly move your knees back to the starting position. 6. Repeat this exercise on the other side of your body. Repeat __________ times. Complete this exercise __________ times a day. Exercise B: Prone extension on elbows  1. Lie on your abdomen on a firm surface. 2. Prop yourself up on your elbows. 3. Use your arms to help lift your chest up until you feel a gentle stretch in your abdomen and your lower back. ? This will place some of your body weight on your elbows. If this is uncomfortable, try stacking pillows under your chest. ? Your hips should stay down, against the surface that you are lying on. Keep your hip and back muscles relaxed. 4. Hold for __________ seconds. 5. Slowly relax your upper body and return to the starting position. Repeat __________ times. Complete this exercise __________ times a day. Strengthening exercises These  exercises build strength and endurance in your back. Endurance is the ability to use your muscles for a long time, even after they get tired. Exercise C: Pelvic tilt 1. Lie on your back on a firm surface. Bend your knees and keep your feet flat. 2. Tense your abdominal muscles. Tip your pelvis up toward the ceiling and flatten your lower back into the floor. ? To help with this exercise, you may place a small towel under your lower back and try to push your back into the towel. 3. Hold for __________ seconds. 4. Let your muscles relax completely before you repeat this exercise. Repeat __________ times. Complete this exercise __________ times a day. Exercise D: Alternating arm and leg raises  1. Get on your hands and knees on a firm surface. If you are on a hard floor, you may want to use padding to cushion your knees, such as an exercise mat. 2. Line up your arms and legs. Your hands should be below your shoulders, and your knees should be below your hips. 3. Lift your left leg behind you. At the same time, raise your right arm and straighten it in front of you. ? Do not lift your leg higher than your hip. ? Do not lift your arm higher than your shoulder. ? Keep your abdominal and back muscles tight. ? Keep your hips facing the ground. ? Do not arch your back. ? Keep your balance carefully, and do not hold your breath. 4. Hold  for __________ seconds. 5. Slowly return to the starting position and repeat with your right leg and your left arm. Repeat __________ times. Complete this exercise __________ times a day. Exercise E: Abdominal set with straight leg raise  1. Lie on your back on a firm surface. 2. Bend one of your knees and keep your other leg straight. 3. Tense your abdominal muscles and lift your straight leg up, 4-6 inches (10-15 cm) off the ground. 4. Keep your abdominal muscles tight and hold for __________ seconds. ? Do not hold your breath. ? Do not arch your back. Keep it  flat against the ground. 5. Keep your abdominal muscles tense as you slowly lower your leg back to the starting position. 6. Repeat with your other leg. Repeat __________ times. Complete this exercise __________ times a day. Posture and body mechanics  Body mechanics refers to the movements and positions of your body while you do your daily activities. Posture is part of body mechanics. Good posture and healthy body mechanics can help to relieve stress in your body's tissues and joints. Good posture means that your spine is in its natural S-curve position (your spine is neutral), your shoulders are pulled back slightly, and your head is not tipped forward. The following are general guidelines for applying improved posture and body mechanics to your everyday activities. Standing   When standing, keep your spine neutral and your feet about hip-width apart. Keep a slight bend in your knees. Your ears, shoulders, and hips should line up.  When you do a task in which you stand in one place for a long time, place one foot up on a stable object that is 2-4 inches (5-10 cm) high, such as a footstool. This helps keep your spine neutral. Sitting   When sitting, keep your spine neutral and keep your feet flat on the floor. Use a footrest, if necessary, and keep your thighs parallel to the floor. Avoid rounding your shoulders, and avoid tilting your head forward.  When working at a desk or a computer, keep your desk at a height where your hands are slightly lower than your elbows. Slide your chair under your desk so you are close enough to maintain good posture.  When working at a computer, place your monitor at a height where you are looking straight ahead and you do not have to tilt your head forward or downward to look at the screen. Resting   When lying down and resting, avoid positions that are most painful for you.  If you have pain with activities such as sitting, bending, stooping, or squatting  (flexion-based activities), lie in a position in which your body does not bend very much. For example, avoid curling up on your side with your arms and knees near your chest (fetal position).  If you have pain with activities such as standing for a long time or reaching with your arms (extension-based activities), lie with your spine in a neutral position and bend your knees slightly. Try the following positions:  Lying on your side with a pillow between your knees.  Lying on your back with a pillow under your knees. Lifting   When lifting objects, keep your feet at least shoulder-width apart and tighten your abdominal muscles.  Bend your knees and hips and keep your spine neutral. It is important to lift using the strength of your legs, not your back. Do not lock your knees straight out.  Always ask for help to lift heavy or  awkward objects. This information is not intended to replace advice given to you by your health care provider. Make sure you discuss any questions you have with your health care provider. Document Released: 03/20/2005 Document Revised: 11/25/2015 Document Reviewed: 12/30/2014 [employer] Interactive Patient Education  2019 [employer].   Core Strength Exercises  Core exercises help to build strength in the muscles between your ribs and your hips (abdominal muscles). These muscles help to support your body and keep your spine stable. It is important to maintain strength in your core to prevent injury and pain. Some activities, such as yoga and Pilates, can help to strengthen core muscles. You can also strengthen core muscles with exercises at home. It is important to talk to your health care provider before you start a new exercise routine. What are the benefits of core strength exercises? Core strength exercises can:  Reduce back pain.  Help to rebuild strength after a back or spine injury.  Help to prevent injury during physical activity, especially injuries to  the back and knees. How to do core strength exercises Repeat these exercises 10-15 times, or until you are tired. Do exercises exactly as told by your health care provider and adjust them as directed. It is normal to feel mild stretching, pulling, tightness, or discomfort as you do these exercises. If you feel any pain while doing these exercises, stop. If your pain continues or gets worse when doing core exercises, contact your health care provider. You may want to use a padded yoga or exercise mat for strength exercises that are done on the floor. Bridging  1. Lie on your back on a firm surface with your knees bent and your feet flat on the floor. 2. Raise your hips so that your knees, hips, and shoulders form a straight line together. Keep your abdominal muscles tight. 3. Hold this position for 3-5 seconds. 4. Slowly lower your hips to the starting position. 5. Let your muscles relax completely between repetitions. Single-leg bridge 1. Lie on your back on a firm surface with your knees bent and your feet flat on the floor. 2. Raise your hips so that your knees, hips, and shoulders form a straight line together. Keep your abdominal muscles tight. 3. Lift one foot off the floor, then completely straighten that leg. 4. Hold this position for 3-5 seconds. 5. Put the straight leg back down in the bent position. 6. Slowly lower your hips to the starting position. 7. Repeat these steps using your other leg. Side bridge 1. Lie on your side with your knees bent. Prop yourself up on the elbow that is near the floor. 2. Using your abdominal muscles and your elbow that is on the floor, raise your body off the floor. Raise your hip so that your shoulder, hip, and foot form a straight line together. 3. Hold this position for 10 seconds. Keep your head and neck raised and away from your shoulder (in their normal, neutral position). Keep your abdominal muscles tight. 4. Slowly lower your hip to the  starting position. 5. Repeat and try to hold this position longer, working your way up to 30 seconds. Abdominal crunch 1. Lie on your back on a firm surface. Bend your knees and keep your feet flat on the floor. 2. Cross your arms over your chest. 3. Without bending your neck, tip your chin slightly toward your chest. 4. Tighten your abdominal muscles as you lift your chest just high enough to lift your shoulder blades off  of the floor. Do not hold your breath. You can do this with short lifts or long lifts. 5. Slowly return to the starting position. Bird dog 1. Get on your hands and knees, with your legs shoulder-width apart and your arms under your shoulders. Keep your back straight. 2. Tighten your abdominal muscles. 3. Raise one of your legs off the floor and straighten it. Try to keep it parallel to the floor. 4. Slowly lower your leg to the starting position. 5. Raise one of your arms off the floor and straighten it. Try to keep it parallel to the floor. 6. Slowly lower your arm to the starting position. 7. Repeat with the other arm and leg. If possible, try raising a leg and arm at the same time, on opposite sides of the body. For example, raise your left hand and your right leg. Plank 1. Lie on your belly. 2. Prop up your body onto your forearms and your feet, keeping your legs straight. Your body should make a straight line between your shoulders and feet. 3. Hold this position for 10 seconds while keeping your abdominal muscles tight. 4. Lower your body to the starting position. 5. Repeat and try to hold this position longer, working your way up to 30 seconds. Cross-core strengthening 1. Stand with your feet shoulder-width apart. 2. Hold a ball out in front of you. Keep your arms straight. 3. Tighten your abdominal muscles and slowly rotate at your waist from side to side. Keep your feet flat. 4. Once you are comfortable, try repeating this exercise with a heavier ball. Top core  strengthening 1. Stand about 18 inches (46 cm) in front of a wall, with your back to the wall. 2. Keep your feet flat and shoulder-width apart. 3. Tighten your abdominal muscles. 4. Bend your hips and knees. 5. Slowly reach between your legs to touch the wall behind you. 6. Slowly stand back up. 7. Raise your arms over your head and reach behind you. 8. Return to the starting position. General tips  Do not do any exercises that cause pain. If you have pain while exercising, talk to your health care provider.  Always stretch before and after doing these exercises. This can help prevent injury.  Maintain a healthy weight. Ask your health care provider what weight is healthy for you. Contact a health care provider if:  You have back pain that gets worse or does not go away.  You feel pain while doing core strength exercises. Get help right away if:  You have severe pain that does not get better with medicine. Summary  Core exercises help to build strength in the muscles between your ribs and your waist.  Core muscles help to support your body and keep your spine stable.  Some activities, such as yoga and Pilates, can help to strengthen core muscles.  Core strength exercises can help back pain and can prevent injury.  If you feel any pain while doing core strength exercises, stop. This information is not intended to replace advice given to you by your health care provider. Make sure you discuss any questions you have with your health care provider. Document Released: 08/09/2016 Document Revised: 08/09/2016 Document Reviewed: 08/09/2016 [employer] Interactive Patient Education  2019 [employer].

## 2018-07-30 NOTE — Progress Notes (Signed)
Name: Evan Rivas   MRN: [id number]    DOB: 1950/12/05   Date:07/30/2018       Progress Note  Subjective  Chief Complaint  Chief Complaint  Patient presents with  . Back Pain    pain management    I connected with  [name]  on 07/30/18 at  9:00 AM EDT by a video enabled telemedicine application and verified that I am speaking with the correct person using two identifiers.  I discussed the limitations of evaluation and management by telemedicine and the availability of in person appointments. The patient expressed understanding and agreed to proceed. Staff also discussed with the patient that there may be a patient responsible charge related to this service. Patient Location: Home Provider Location: Home Additional Individuals present: None  HPI  Mr. Bechard presents today to initiate opiate therapy with our clinic.  He has been seeing Dr. [name] with pain management, but has recently experienced significant financial difficulties causing him to be unable to afford these visits.  After discussion with both Dr. [name], and [name], my attending Physician, it is agreed that we will provide ongoing monitoring in primary care, but that we will need to re-evaluate and hopefully refer back to Dr. [name] after one year.  He is also aware that he must be tapering down on his opiate therapy while under my care.  Medications: - He has tried coming off of nabumetone - taking 1 tablet daily - was taking BID, but was advised to cut down by myself due to GI risk and due to slight decrease in kidney function. - He is currently taking 10-325 of norco TID.  He is willing to decrease to BID of the 10mg  and once daily of 5mg  every other day for the time being.  Due for refill on 08/07/2018.  - Taking Tizanidine once daily. - Taking Gabapentin 300mg  TID.  - DDD/chronic back pain: Has struggled with RIGHT lower back pain for many years. He fell out of bed recently (lost his balance, was  awake), and his lower LEFT back is now hurting.  There is no radiation into the legs, no weakness in legs. He has done PT for his back in the past and it seemed to help once he was able to tolerate the exercises.  He has never had any spinal surgeries; Dr. [name] did not do any procedures with [name] as Mr. Goswami declined. - He is aware that my Rx of his pain medication is temporary to help with his significant financial burden.  We will re-evaluate our pain contract and need for my assistance at the end of the 2020 Calendar year.  - Discussed Narcan auto-injector, we will send this in today.  Patient Active Problem List   Diagnosis Date Noted  . Hyperaldosteronism ([place]) 06/24/2018  . Diastolic dysfunction 09/32/3557  . OSA (obstructive sleep apnea) 06/24/2018  . Excessive daytime sleepiness 06/24/2018  . Lower extremity edema 12/18/2017  . BPH without urinary obstruction 06/03/2015  . Depression 03/05/2015  . Hyperlipidemia 09/03/2014  . Major depressive disorder, recurrent episode, moderate ([place]) 09/03/2014  . Chronic low back pain 09/03/2014  . Arthralgia of both knees 09/03/2014  . Type 2 diabetes mellitus without complication ([place]) 32/20/2542  . Allergic rhinitis 07/04/2014  . Anxiety 07/04/2014  . Continuous opioid dependence ([place]) 07/04/2014  . Diabetes mellitus type 2 in obese ([place]) 07/04/2014  . DDD (degenerative disc disease), lumbar 07/04/2014  . Gout 07/04/2014  . Essential hypertension 07/04/2014  .  Flu vaccine need 07/04/2014  . Morbid obesity ([place]) 07/04/2014  . OA (osteoarthritis) of knee 07/04/2014  . Neuralgia neuritis, sciatic nerve 07/04/2014  . Arthralgia of multiple joints 07/04/2014  . H/O malignant neoplasm of skin 12/24/2013    Past Surgical History:  Procedure Laterality Date  . ARTHROSCOPIC REPAIR ACL Left   . CYSTOSCOPY W/ URETERAL STENT PLACEMENT Right 09/07/2014   Procedure: CYSTOSCOPY WITH STENT REPLACEMENT;  Surgeon: Hollice Espy, MD;   Location: [facility];  Service: Urology;  Laterality: Right;  . CYSTOSCOPY/URETEROSCOPY/HOLMIUM LASER/STENT PLACEMENT Right 08/17/2014   Procedure: CYSTOSCOPY/URETEROSCOPY//STENT PLACEMENT/ attempt of lithotripsy;  Surgeon: Hollice Espy, MD;  Location: [facility];  Service: Urology;  Laterality: Right;  . HERNIA REPAIR    . MOHS SURGERY     lip  . TOTAL KNEE ARTHROPLASTY Left 04/24/2016   Procedure: LEFT TOTAL KNEE ARTHROPLASTY;  Surgeon: Gaynelle Arabian, MD;  Location: [facility];  Service: Orthopedics;  Laterality: Left;  Adductor Block  . TOTAL KNEE ARTHROPLASTY Right 10/23/2016   Procedure: RIGHT TOTAL KNEE ARTHROPLASTY;  Surgeon: Gaynelle Arabian, MD;  Location: [facility];  Service: Orthopedics;  Laterality: Right;  . URETEROSCOPY WITH HOLMIUM LASER LITHOTRIPSY Right 09/07/2014   Procedure: URETEROSCOPY WITH HOLMIUM LASER LITHOTRIPSY;  Surgeon: Hollice Espy, MD;  Location: [facility];  Service: Urology;  Laterality: Right;  [name] VASECTOMY      Family History  Problem Relation Age of Onset  . Hypertension Mother   . Cancer Mother   . Hyperlipidemia Mother   . Stroke Father   . Heart disease Father   . Depression Sister   . Hypertension Sister   . Hyperlipidemia Sister     Social History   Socioeconomic History  . Marital status: Married    Spouse name: Hoyle Sauer  . Number of children: 1  . Years of education: Not on file  . Highest education level: Not on file  Occupational History  . Occupation: retired  Scientific laboratory technician  . Financial resource strain: Not hard at all  . Food insecurity:    Worry: Never true    Inability: Never true  . Transportation needs:    Medical: No    Non-medical: No  Tobacco Use  . Smoking status: Never Smoker  . Smokeless tobacco: Never Used  Substance and Sexual Activity  . Alcohol use: No  . Drug use: No  . Sexual activity: Yes  Lifestyle  . Physical activity:    Days per week: 4 days    Minutes per session: 60 min  . Stress: Only a little  Relationships   . Social connections:    Talks on phone: More than three times a week    Gets together: More than three times a week    Attends religious service: 1 to 4 times per year    Active member of club or organization: Yes    Attends meetings of clubs or organizations: 1 to 4 times per year    Relationship status: Married  . Intimate partner violence:    Fear of current or ex partner: No    Emotionally abused: No    Physically abused: Not on file    Forced sexual activity: No  Other Topics Concern  . Not on file  Social History Narrative  . Not on file     Current Outpatient Medications:  .  allopurinol (ZYLOPRIM) 300 MG tablet, Take 1 tablet (300 mg total) by mouth every evening., Disp: 90 tablet, Rfl: 1 .  ASPIRIN 81 PO, Take  81 mg by mouth daily. , Disp: , Rfl:  .  atorvastatin (LIPITOR) 40 MG tablet, Take 1 tablet (40 mg total) by mouth at bedtime., Disp: 90 tablet, Rfl: 1 .  Dulaglutide (TRULICITY) 1.70 YF/7.4BS SOPN, Inject 0.75 mLs into the skin once a week., Disp: 2 mL, Rfl: 3 .  FLUoxetine (PROZAC) 40 MG capsule, Take 1 capsule (40 mg total) by mouth daily., Disp: 90 capsule, Rfl: 1 .  fluticasone (FLONASE) 50 MCG/ACT nasal spray, Place 2 sprays into both nostrils daily. 2 sprays, Disp: 16 g, Rfl: 6 .  glipiZIDE (GLUCOTROL) 10 MG tablet, Take 1 tablet (10 mg total) by mouth daily before breakfast., Disp: 90 tablet, Rfl: 1 .  glucose blood (ACCU-CHEK AVIVA PLUS) test strip, Use as instructed, Disp: 100 each, Rfl: 12 .  hydrochlorothiazide (HYDRODIURIL) 25 MG tablet, Take 1 tablet (25 mg total) by mouth daily., Disp: 90 tablet, Rfl: 1 .  HYDROcodone-acetaminophen (NORCO) 10-325 MG tablet, Take 1 tablet by mouth every 8 (eight) hours as needed for up to 30 days for severe pain., Disp: 90 tablet, Rfl: 0 .  hydrOXYzine (ATARAX/VISTARIL) 10 MG tablet, Take 1 tablet (10 mg total) by mouth every 8 (eight) hours as needed for anxiety., Disp: 30 tablet, Rfl: 1 .  losartan (COZAAR) 100 MG  tablet, Take 1 tablet (100 mg total) by mouth at bedtime., Disp: 90 tablet, Rfl: 0 .  metFORMIN (GLUCOPHAGE XR) 750 MG 24 hr tablet, Take 2 tablets (1,500 mg total) by mouth daily with breakfast., Disp: 14 tablet, Rfl: 0 .  metoprolol succinate (TOPROL-XL) 100 MG 24 hr tablet, Take 1 tablet (100 mg total) by mouth every morning., Disp: 90 tablet, Rfl: 1 .  metoprolol succinate (TOPROL-XL) 25 MG 24 hr tablet, Take 2 tablets (50 mg total) by mouth every morning., Disp: 180 tablet, Rfl: 3 .  spironolactone (ALDACTONE) 25 MG tablet, Take 2 tablets (50 mg total) by mouth daily., Disp: 90 tablet, Rfl: 3 .  tamsulosin (FLOMAX) 0.4 MG CAPS capsule, Take 1 capsule (0.4 mg total) by mouth daily after supper., Disp: 90 capsule, Rfl: 1 .  tiZANidine (ZANAFLEX) 4 MG tablet, Take 1 tablet (4 mg total) by mouth at bedtime., Disp: 90 tablet, Rfl: 1 .  amLODipine (NORVASC) 5 MG tablet, Take 1 tablet (5 mg total) by mouth daily., Disp: 90 tablet, Rfl: 3 .  gabapentin (NEURONTIN) 300 MG capsule, Take 1 capsule (300 mg total) by mouth 3 (three) times daily., Disp: 270 capsule, Rfl: 0  Allergies  Allergen Reactions  . Celebrex  [Celecoxib]     GI  . Tetanus Toxoids Other (See Comments)    unkown  . Tetanus-Diphtheria Toxoids Td     Other reaction(s): Unknown    I personally reviewed active problem list, medication list, allergies, notes from last encounter, lab results with the patient/caregiver today.   ROS Constitutional: Negative for fever or weight change.  Respiratory: Negative for cough and shortness of breath.   Cardiovascular: Negative for chest pain or palpitations.  Gastrointestinal: Negative for abdominal pain, no bowel changes.  Musculoskeletal: Negative for gait problem or joint swelling. See above Skin: Negative for rash.  Neurological: Negative for dizziness or headache.  No other specific complaints in a complete review of systems (except as listed in HPI above).   Objective  Virtual  encounter, vitals not obtained.  There is no height or weight on file to calculate BMI.  Physical Exam  Constitutional: Patient appears well-developed and well-nourished. No distress.  HENT: Head:  Normocephalic and atraumatic.  Neck: Normal range of motion. Pulmonary/Chest: Effort normal. No respiratory distress. Speaking in complete sentences Neurological: Pt is alert and oriented to person, place, and time. Coordination, speech and gait are normal.  Psychiatric: Patient has a normal mood and affect. behavior is normal. Judgment and thought content normal.  No results found for this or any previous visit (from the past 72 hour(s)).  PHQ2/9: Depression screen [facility] 2/9 07/30/2018 06/20/2018 04/23/2018 03/25/2018 12/18/2017  Decreased Interest 0 0 0 0 0  Down, Depressed, Hopeless 0 0 0 0 0  PHQ - 2 Score 0 0 0 0 0  Altered sleeping 1 1 - 2 1  Tired, decreased energy 0 1 - 1 1  Change in appetite 0 0 - 0 0  Feeling bad or failure about yourself  0 0 - 0 0  Trouble concentrating 0 0 - 0 0  Moving slowly or fidgety/restless 0 0 - 0 0  Suicidal thoughts 0 0 - 0 0  PHQ-9 Score 1 2 - 3 2  Difficult doing work/chores Not difficult at all Not difficult at all - Not difficult at all -  Some recent data might be hidden   PHQ-2/9 Result is negative.    Fall Risk: Fall Risk  07/30/2018 06/20/2018 04/23/2018 03/25/2018 12/18/2017  Falls in the past year? 1 1 0 0 No  Number falls in past yr: 1 1 0 - -  Injury with Fall? 1 1 0 - -  Follow up Falls evaluation completed Falls evaluation completed - - -    Assessment & Plan  1. DDD (degenerative disc disease), lumbar - Due for refill of Norco on 08/07/2018 - will provide refill after he comes in to office on 08/01/2018 for UDS and to sign opiate contract.  We will plan to have him take 1 tablet twice daily + 1/2 tablet once daily alternating with 1 tablet TID to initiate a taper down of the opiate therapy. - Naloxone HCl 0.4 MG/0.4ML SOAJ; Take once as  needed for opiate overdose and call 911  Dispense: 1 Package; Refill: 1 - Urine Microalbumin w/creat. ratio - Drugs of abuse screen w/o alc, rtn urine-sln - discussed back exercises and core exercises in detail  2. Continuous opioid dependence ([place]) - Disucssed use of auto-injector and when this would be utilized, he verbalizes understanding.  Will come to office 08/01/2018 to sign opiate contract and to have UDS performed.  This patient has been on chronic opiate therapy for many years has has been very consistently compliant.  I have personally reviewed the [place] recipient query regarding this patient and do not see any irregularities or concerning findings. - Controlled Substance Discussion: Discussed risk of controlled substances including possible unintentional overdose, especially if mixed with alcohol or other pills; typical speech given including illegal to share, always keep in the original bottle, safeguard medicine, do NOT mix with alcohol, other pain pills, "nerve" or anxiety pills, or sleeping pills; I am not obligated to approve of early refill or give new prescription if medicine is lost, stolen, or destroyed even with a police report, etc. - [place] Database is checked and no red flags are found. - Naloxone HCl 0.4 MG/0.4ML SOAJ; Take once as needed for opiate overdose and call 911  Dispense: 1 Package; Refill: 1 - Urine Microalbumin w/creat. ratio - Drugs of abuse screen w/o alc, rtn urine-sln  3. Morbid obesity ([place]) - discussed increasing exercise as tolerated  4. Primary osteoarthritis of both knees -  Medications as discussed above - Naloxone HCl 0.4 MG/0.4ML SOAJ; Take once as needed for opiate overdose and call 911  Dispense: 1 Package; Refill: 1 - Urine Microalbumin w/creat. ratio - Drugs of abuse screen w/o alc, rtn urine-sln  I discussed the assessment and treatment plan with the patient. The patient was provided an opportunity to ask questions and all were answered. The  patient agreed with the plan and demonstrated an understanding of the instructions.  The patient was advised to call back or seek an in-person evaluation if the symptoms worsen or if the condition fails to improve as anticipated.  I provided 36 minutes of non-face-to-face time during this encounter.

## 2018-08-01 ENCOUNTER — Telehealth: Payer: Self-pay | Admitting: Family Medicine

## 2018-08-01 NOTE — Telephone Encounter (Signed)
Pt would like a return call, would like to know when would be a good time to order his medication. [phone]

## 2018-08-01 NOTE — Telephone Encounter (Signed)
Refill prescription was submitted 07/30/18, refill paperwork was uploaded under media tab.   [facility] has recently switched [employer], I am unsure of the turnaround time for prescription processing. [facility] will contact patient for delivery, he may contact them directly [phone].   Called patient to update him on the above information.   [name], PharmD Clinical Pharmacist [facility] [phone]

## 2018-08-05 ENCOUNTER — Other Ambulatory Visit: Payer: Self-pay

## 2018-08-05 DIAGNOSIS — M170 Bilateral primary osteoarthritis of knee: Secondary | ICD-10-CM

## 2018-08-05 DIAGNOSIS — F1120 Opioid dependence, uncomplicated: Secondary | ICD-10-CM

## 2018-08-05 DIAGNOSIS — M5136 Other intervertebral disc degeneration, lumbar region: Secondary | ICD-10-CM

## 2018-08-05 MED ORDER — NALOXONE HCL 0.4 MG/0.4ML IJ SOAJ: INTRAMUSCULAR | 1 refills | Status: DC

## 2018-08-06 LAB — DRUGS OF ABUSE SCREEN W/O ALC, ROUTINE URINE
AMPHETAMINES (1000 ng/mL SCRN): NEGATIVE
BARBITURATES: NEGATIVE
BENZODIAZEPINES: NEGATIVE
COCAINE METABOLITES: NEGATIVE
CODEINE: NEGATIVE
HYDROCODONE: POSITIVE — AB
HYDROMORPHONE: POSITIVE — AB
MARIJUANA MET (50 ng/mL SCRN): NEGATIVE
METHADONE: NEGATIVE
METHAQUALONE: NEGATIVE
MORPHINE: NEGATIVE
OPIATES: POSITIVE — AB
PHENCYCLIDINE: NEGATIVE
PROPOXYPHENE: NEGATIVE

## 2018-08-06 LAB — MICROALBUMIN / CREATININE URINE RATIO
Creatinine, Urine: 106 mg/dL (ref 20–320)
Microalb Creat Ratio: 11 mcg/mg creat (ref <30)
Microalb, Ur: 1.2 mg/dL

## 2018-08-07 ENCOUNTER — Telehealth: Payer: Self-pay | Admitting: Family Medicine

## 2018-08-07 NOTE — Telephone Encounter (Signed)
Copied from [place] [id number]. Topic: Quick Communication - See Telephone Encounter >> Aug 07, 2018  9:28 AM Sheran Luz wrote: CRM for notification. See Telephone encounter for: 08/07/18.  Patient would like call back from PCP or CMA to discuss results from 4/28.

## 2018-08-07 NOTE — Telephone Encounter (Signed)
Discussed labs

## 2018-08-09 ENCOUNTER — Telehealth: Payer: Self-pay

## 2018-08-09 ENCOUNTER — Other Ambulatory Visit: Payer: Self-pay | Admitting: Family Medicine

## 2018-08-09 DIAGNOSIS — Z79899 Other long term (current) drug therapy: Secondary | ICD-10-CM

## 2018-08-09 DIAGNOSIS — M170 Bilateral primary osteoarthritis of knee: Secondary | ICD-10-CM

## 2018-08-09 DIAGNOSIS — M5136 Other intervertebral disc degeneration, lumbar region: Secondary | ICD-10-CM

## 2018-08-09 DIAGNOSIS — F1120 Opioid dependence, uncomplicated: Secondary | ICD-10-CM

## 2018-08-09 MED ORDER — NALOXONE HCL 0.4 MG/0.4ML IJ SOAJ: INTRAMUSCULAR | 1 refills | Status: DC

## 2018-08-09 MED ORDER — NALOXONE HCL 4 MG/0.1ML NA LIQD: 1.0000 | Freq: Once | NASAL | 1 refills | Status: DC

## 2018-08-09 MED ORDER — HYDROCODONE-ACETAMINOPHEN 10-325 MG PO TABS: ORAL_TABLET | ORAL | 0 refills | Status: DC

## 2018-08-09 NOTE — Progress Notes (Signed)
Will provide refill for 1 month, may provide 1 additional temporary refill to get him to his next follow up, then will resume routine Q6mo appt's for opiate therapy.  #[phone] sent in - see instructions.

## 2018-08-09 NOTE — Telephone Encounter (Signed)
Pt states the narcan rx to [facility] is to exspensive

## 2018-08-09 NOTE — Telephone Encounter (Signed)
[employer] states: NARCAN 2 PACK SPRAY 4MG 

## 2018-08-09 NOTE — Telephone Encounter (Signed)
Please call pharmacy and confirm that this is the correct Narcan order they will cover.

## 2018-08-09 NOTE — Telephone Encounter (Signed)
Please contact [employer] to find out which form of Narcan would be least expensive for him to keep at home

## 2018-08-15 ENCOUNTER — Other Ambulatory Visit: Payer: Self-pay | Admitting: Emergency Medicine

## 2018-08-15 MED ORDER — NALOXONE HCL 0.4 MG/0.4ML IJ SOAJ: INTRAMUSCULAR | 1 refills | Status: DC

## 2018-08-21 ENCOUNTER — Telehealth (INDEPENDENT_AMBULATORY_CARE_PROVIDER_SITE_OTHER): Payer: Medicare PPO

## 2018-08-21 ENCOUNTER — Other Ambulatory Visit: Payer: Self-pay

## 2018-08-21 ENCOUNTER — Encounter: Payer: Self-pay | Admitting: Family Medicine

## 2018-08-21 ENCOUNTER — Ambulatory Visit (INDEPENDENT_AMBULATORY_CARE_PROVIDER_SITE_OTHER): Payer: Medicare PPO | Admitting: Family Medicine

## 2018-08-21 VITALS — Temp 96.9°F

## 2018-08-21 DIAGNOSIS — E669 Obesity, unspecified: Secondary | ICD-10-CM

## 2018-08-21 DIAGNOSIS — N3000 Acute cystitis without hematuria: Secondary | ICD-10-CM

## 2018-08-21 DIAGNOSIS — F1120 Opioid dependence, uncomplicated: Secondary | ICD-10-CM

## 2018-08-21 DIAGNOSIS — E1169 Type 2 diabetes mellitus with other specified complication: Secondary | ICD-10-CM

## 2018-08-21 DIAGNOSIS — M5136 Other intervertebral disc degeneration, lumbar region: Secondary | ICD-10-CM

## 2018-08-21 DIAGNOSIS — Z79899 Other long term (current) drug therapy: Secondary | ICD-10-CM

## 2018-08-21 MED ORDER — HYDROCODONE-ACETAMINOPHEN 10-325 MG PO TABS: ORAL_TABLET | ORAL | 0 refills | Status: DC

## 2018-08-21 MED ORDER — CIPROFLOXACIN HCL 250 MG PO TABS: 250.0000 mg | ORAL_TABLET | Freq: Two times a day (BID) | ORAL | 0 refills | Status: DC

## 2018-08-21 NOTE — Telephone Encounter (Signed)
Copied from [place] [id number]. Topic: Quick Communication - Rx Refill/Question >> Aug 21, 2018  2:50 PM Erick Blinks wrote: Medication: ciprofloxacin (CIPRO) 250 MG -pt called to report that [facility] has rejected this medication because they need authorization from the PCP. Please advise

## 2018-08-21 NOTE — Telephone Encounter (Signed)
Received this at the end of day unable to do prior auth.  Called pahrmacy because we normally do not get prior auth for antibiotics but she states his ins will not cover.  She stated try a Quinolone or I can try PA tomorrow.

## 2018-08-21 NOTE — Telephone Encounter (Signed)
Pt called to say that he just got off the phone with his pharmacy and they are sending over a fax now to for the doctor to review.

## 2018-08-21 NOTE — Progress Notes (Signed)
Name: Evan Rivas   MRN: [id number]    DOB: June 15, 1950   Date:08/21/2018       Progress Note  Subjective  Chief Complaint  Chief Complaint  Patient presents with   Urinary Tract Infection    frequent urination, pt states just does not feel right, nausea, off balance and low grade fever    I connected with  [name]  on 08/21/18 at  9:40 AM EDT by a video enabled telemedicine application and verified that I am speaking with the correct person using two identifiers.  I discussed the limitations of evaluation and management by telemedicine and the availability of in person appointments. The patient expressed understanding and agreed to proceed. Staff also discussed with the patient that there may be a patient responsible charge related to this service. Patient Location: Home Provider Location: Home Additional Individuals present: None  HPI  Pt presents with concern for illness that started 08/15/2018.  Last week he had a few days of trouble with some dizziness/lightheadedness, but no slurred speech or confusion, no facial droop or extremity weakness/numbness/tingling.  Has had urinary frequency the entire time, has had some diarrhea over the last few days along with nausea. He does note having had some darker urine 2-3 days ago, but is now back to being a pale yellow.  He has not had any respiratory symptoms - no chest pain, shortness of breath, no cough, sore throat, abdominal pain, vomiting, no myalgias or arthralgias, no back pain.  He has been cautious not to go out without a mask on.  Did have low grade temp at 100.7 on Saturday 08/17/2018 it was 48F.  He has been tracking his blood sugar and heart rate and BP 08/15/2018 - CBG 147 5/15 - CBG 197 -- 99bpm -- 137/90 5/16 - CBG 192 -- 95bpm -- 139/80 5/17 -CBG 242 -- 92bpm -- 131/70 5/18 - CBG 156 --74 bpm --  128/79 5/19 - 148/90  - Has DM - that is poorly controlled.  He is currently taking trulicity once daily,  metformin and glipizide.  - Opiate therapy: He has decreased his hydrocodone to taking 2.5 tablets 3 days a week (take 3 tablets all other day) and is doing well, does not feel that this change is contributing to his symptoms.  He does note an error in calculating # tablets prescribed which I do see - #78 was sent in, and #87 should have been prescribed.  He will run out on 09/05/2018 instead of 09/09/2018, and I will provide 30-day supply starting on 09/05/2018 because of this.  Controlled substance contract is in place and he has received his narcan at his home with understanding on how to use.  Pain has been moderately controlled with current medication regimen.  Patient Active Problem List   Diagnosis Date Noted   Hyperaldosteronism ([place]) 93/79/0240   Diastolic dysfunction 97/35/3299   OSA (obstructive sleep apnea) 06/24/2018   Excessive daytime sleepiness 06/24/2018   Aortic atherosclerosis ([place]) 06/17/2018   Lower extremity edema 12/18/2017   BPH without urinary obstruction 06/03/2015   Depression 03/05/2015   Hyperlipidemia 09/03/2014   Major depressive disorder, recurrent episode, moderate ([place]) 09/03/2014   Chronic low back pain 09/03/2014   Arthralgia of both knees 09/03/2014   Type 2 diabetes mellitus without complication ([place]) 24/26/8341   Allergic rhinitis 07/04/2014   Anxiety 07/04/2014   Continuous opioid dependence ([place]) 07/04/2014   Diabetes mellitus type 2 in obese ([place]) 07/04/2014   DDD (degenerative  disc disease), lumbar 07/04/2014   Gout 07/04/2014   Essential hypertension 07/04/2014   Flu vaccine need 07/04/2014   Morbid obesity ([place]) 07/04/2014   OA (osteoarthritis) of knee 07/04/2014   Neuralgia neuritis, sciatic nerve 07/04/2014   Arthralgia of multiple joints 07/04/2014   H/O malignant neoplasm of skin 12/24/2013    Social History   Tobacco Use   Smoking status: Never Smoker   Smokeless tobacco: Never Used  Substance Use Topics    Alcohol use: No     Current Outpatient Medications:    allopurinol (ZYLOPRIM) 300 MG tablet, Take 1 tablet (300 mg total) by mouth every evening., Disp: 90 tablet, Rfl: 1   ASPIRIN 81 PO, Take 81 mg by mouth daily. , Disp: , Rfl:    atorvastatin (LIPITOR) 40 MG tablet, Take 1 tablet (40 mg total) by mouth at bedtime., Disp: 90 tablet, Rfl: 1   Dulaglutide (TRULICITY) 9.41 DE/0.8XK SOPN, Inject 0.75 mLs into the skin once a week., Disp: 2 mL, Rfl: 3   FLUoxetine (PROZAC) 40 MG capsule, Take 1 capsule (40 mg total) by mouth daily., Disp: 90 capsule, Rfl: 1   fluticasone (FLONASE) 50 MCG/ACT nasal spray, Place 2 sprays into both nostrils daily. 2 sprays, Disp: 16 g, Rfl: 6   glipiZIDE (GLUCOTROL) 10 MG tablet, Take 1 tablet (10 mg total) by mouth daily before breakfast., Disp: 90 tablet, Rfl: 1   hydrochlorothiazide (HYDRODIURIL) 25 MG tablet, Take 1 tablet (25 mg total) by mouth daily., Disp: 90 tablet, Rfl: 1   HYDROcodone-acetaminophen (NORCO) 10-325 MG tablet, Take 1 tab BID; For 3rd daily dose: Take 1/2 tab Mon/Wed/Fri and take 1 tab Sun/Tues/Thurs/Sat, Disp: 78 tablet, Rfl: 0   hydrOXYzine (ATARAX/VISTARIL) 10 MG tablet, Take 1 tablet (10 mg total) by mouth every 8 (eight) hours as needed for anxiety., Disp: 30 tablet, Rfl: 1   losartan (COZAAR) 100 MG tablet, Take 1 tablet (100 mg total) by mouth at bedtime., Disp: 90 tablet, Rfl: 0   metFORMIN (GLUCOPHAGE XR) 750 MG 24 hr tablet, Take 2 tablets (1,500 mg total) by mouth daily with breakfast., Disp: 14 tablet, Rfl: 0   metoprolol succinate (TOPROL-XL) 100 MG 24 hr tablet, Take 1 tablet (100 mg total) by mouth every morning., Disp: 90 tablet, Rfl: 1   metoprolol succinate (TOPROL-XL) 25 MG 24 hr tablet, Take 2 tablets (50 mg total) by mouth every morning., Disp: 180 tablet, Rfl: 3   Naloxone HCl 0.4 MG/0.4ML SOAJ, Injected once, then call 911.  As needed for opiate overdose., Disp: 1 Package, Rfl: 1   spironolactone (ALDACTONE)  25 MG tablet, Take 2 tablets (50 mg total) by mouth daily., Disp: 90 tablet, Rfl: 3   tamsulosin (FLOMAX) 0.4 MG CAPS capsule, Take 1 capsule (0.4 mg total) by mouth daily after supper., Disp: 90 capsule, Rfl: 1   tiZANidine (ZANAFLEX) 4 MG tablet, Take 1 tablet (4 mg total) by mouth at bedtime., Disp: 90 tablet, Rfl: 1   amLODipine (NORVASC) 5 MG tablet, Take 1 tablet (5 mg total) by mouth daily., Disp: 90 tablet, Rfl: 3   gabapentin (NEURONTIN) 300 MG capsule, Take 1 capsule (300 mg total) by mouth 3 (three) times daily., Disp: 270 capsule, Rfl: 0   glucose blood (ACCU-CHEK AVIVA PLUS) test strip, Use as instructed, Disp: 100 each, Rfl: 12  Allergies  Allergen Reactions   Celebrex  [Celecoxib]     GI   Tetanus Toxoids Other (See Comments)    unkown   Tetanus-Diphtheria Toxoids Td  Other reaction(s): Unknown    I personally reviewed active problem list, medication list, allergies with the patient/caregiver today.  ROS  Ten systems reviewed and is negative except as mentioned in HPI.  Objective  Virtual encounter, vitals not obtained.  There is no height or weight on file to calculate BMI.  Nursing Note and Vital Signs reviewed.  Physical Exam  Constitutional: Patient appears well-developed and well-nourished. No distress.  HENT: Head: Normocephalic and atraumatic.  Neck: Normal range of motion. Pulmonary/Chest: Effort normal. No respiratory distress. Speaking in complete sentences Neurological: Pt is alert and oriented to person, place, and time. Coordination, speech and gait are normal.  Psychiatric: Patient has a normal mood and affect. behavior is normal. Judgment and thought content normal. Abdominal: Self-palpates and denies tenderness. UTA CVA tenderness.  No results found for this or any previous visit (from the past 72 hour(s)).  Assessment & Plan  1. Diabetes mellitus type 2 in obese (HCC) - Uncontrolled,d increased risk for infection secondary to  this; he will continue to monitor BG's.  Cipro may cause hypoglycemia, encouraged routine BG testing.  2. Morbid obesity ([place]) - Increased risk for poor outcomes in infections.  3. Acute cystitis without hematuria - We will treat based on symptoms and history of significant UTI with hospitalization in the past.  He is advised to the risks associated with Cipro/fluoroquinolone class and will proceed with 7 day course for cystitis. - ciprofloxacin (CIPRO) 250 MG tablet; Take 1 tablet (250 mg total) by mouth 2 (two) times daily for 7 days.  Dispense: 14 tablet; Refill: 0  4. DDD (degenerative disc disease), lumbar - HYDROcodone-acetaminophen (NORCO) 10-325 MG tablet; Take 1 tab BID; For 3rd daily dose: Take 1/2 tab Mon/Wed/Fri and take 1 tab Sun/Tues/Thurs/Sat  Dispense: 87 tablet; Refill: 0  5. Continuous opioid dependence (HCC) - HYDROcodone-acetaminophen (NORCO) 10-325 MG tablet; Take 1 tab BID; For 3rd daily dose: Take 1/2 tab Mon/Wed/Fri and take 1 tab Sun/Tues/Thurs/Sat  Dispense: 87 tablet; Refill: 0  6. Controlled substance agreement signed - HYDROcodone-acetaminophen (NORCO) 10-325 MG tablet; Take 1 tab BID; For 3rd daily dose: Take 1/2 tab Mon/Wed/Fri and take 1 tab Sun/Tues/Thurs/Sat  Dispense: 87 tablet; Refill: 0  -Red flags and when to present for emergency care or RTC including fever >101.81F, chest pain, shortness of breath, new/worsening/un-resolving symptoms,  reviewed with patient at time of visit. Follow up and care instructions discussed and provided in AVS. - I discussed the assessment and treatment plan with the patient. The patient was provided an opportunity to ask questions and all were answered. The patient agreed with the plan and demonstrated an understanding of the instructions.  I provided 36 minutes of non-face-to-face time during this encounter.  Hubbard Hartshorn, FNP

## 2018-08-22 ENCOUNTER — Encounter: Payer: Self-pay | Admitting: Family Medicine

## 2018-08-22 LAB — POCT URINALYSIS DIPSTICK
Bilirubin, UA: NEGATIVE
Blood, UA: POSITIVE
Glucose, UA: NEGATIVE
Ketones, UA: NEGATIVE
Nitrite, UA: NEGATIVE
Odor: NORMAL
Protein, UA: POSITIVE — AB
Spec Grav, UA: 1.015 (ref 1.010–1.025)
Urobilinogen, UA: 0.2 E.U./dL
pH, UA: 6.5 (ref 5.0–8.0)

## 2018-08-22 MED ORDER — DOXYCYCLINE HYCLATE 100 MG PO TABS: 100.0000 mg | ORAL_TABLET | Freq: Two times a day (BID) | ORAL | 0 refills | Status: AC

## 2018-08-22 NOTE — Telephone Encounter (Signed)
I sent in an antibiotic, this should help once is starts working - may take a few days.  If anything worsens, he needs to go right to Urgent Care or the ER for further evaluation.

## 2018-08-22 NOTE — Telephone Encounter (Signed)
I sent in Doxycyline instead - can you ask him to come by today do do a POCT UA and urine culture please.

## 2018-08-22 NOTE — Telephone Encounter (Signed)
Pt wants to let you know that he is having an urgency to go to bathroom and wants to know if there is anything that can be given for this. Came in a little while ago and did the urine test.

## 2018-08-22 NOTE — Telephone Encounter (Signed)
Message sent Via mychart - please call and advise note on Mychart. Thanks!

## 2018-08-22 NOTE — Telephone Encounter (Signed)
He is asking about something call pyridium?

## 2018-08-22 NOTE — Telephone Encounter (Signed)
Pt.notified

## 2018-08-23 ENCOUNTER — Ambulatory Visit: Payer: Self-pay | Admitting: Pharmacist

## 2018-08-23 DIAGNOSIS — E1169 Type 2 diabetes mellitus with other specified complication: Secondary | ICD-10-CM

## 2018-08-23 NOTE — Chronic Care Management (AMB) (Signed)
  Chronic Care Management   Note  08/23/2018 Name: Evan Rivas MRN: [id number] DOB: 01-Jun-1950  Received inbasket message from provider [name], FNP regarding patient's Trulicity that he receives from assistance program, [employer]. Per Raquel Sarna, patient is receiving one week supply at a time.   [place] and provided prescription information:   Trulicity 0.75mg , Inject once weekly, #16 pens for 4 month supply with 2 refills  Spoke with pharmacist Seth Bake.   Follow up plan: No further follow up required: Will update prescriber via secure message  [name], PharmD Clinical Pharmacist [facility] [phone]

## 2018-08-23 NOTE — Telephone Encounter (Signed)
Pt.notified

## 2018-08-24 LAB — URINE CULTURE
MICRO NUMBER:: 497152
SPECIMEN QUALITY:: ADEQUATE

## 2018-08-28 ENCOUNTER — Telehealth: Payer: Self-pay | Admitting: Family Medicine

## 2018-08-28 NOTE — Telephone Encounter (Signed)
Copied from [place] [phone]. Topic: Quick Communication - Rx Refill/Question >> Aug 28, 2018  4:33 PM Lionel December wrote: Medication:  hydrochlorothiazide (HYDRODIURIL) 25 MG tablet   Has the patient contacted their pharmacy? Yes.   (Agent: If no, request that the patient contact the pharmacy for the refill.) (Agent: If yes, when and what did the pharmacy advise?)  Preferred Pharmacy (with phone number or street name): [place], [place] [phone] (Phone) [phone] (Fax)    Agent: Please be advised that RX refills may take up to 3 business days. We ask that you follow-up with your pharmacy.

## 2018-08-29 ENCOUNTER — Other Ambulatory Visit: Payer: Self-pay | Admitting: Emergency Medicine

## 2018-08-29 DIAGNOSIS — I10 Essential (primary) hypertension: Secondary | ICD-10-CM

## 2018-08-29 DIAGNOSIS — R600 Localized edema: Secondary | ICD-10-CM

## 2018-08-29 MED ORDER — HYDROCHLOROTHIAZIDE 25 MG PO TABS: 25.0000 mg | ORAL_TABLET | Freq: Every day | ORAL | 1 refills | Status: DC

## 2018-08-29 NOTE — Telephone Encounter (Signed)
Order sent to [facility] for refill 

## 2018-09-12 ENCOUNTER — Other Ambulatory Visit: Payer: Self-pay | Admitting: Emergency Medicine

## 2018-09-12 ENCOUNTER — Telehealth: Payer: Self-pay | Admitting: Family Medicine

## 2018-09-12 DIAGNOSIS — E119 Type 2 diabetes mellitus without complications: Secondary | ICD-10-CM

## 2018-09-12 MED ORDER — TRULICITY 0.75 MG/0.5ML ~~LOC~~ SOAJ: 0.7500 mL | SUBCUTANEOUS | 3 refills | Status: DC

## 2018-09-12 NOTE — Telephone Encounter (Signed)
Order sent to [facility] for refill 

## 2018-09-12 NOTE — Telephone Encounter (Signed)
Copied from [place] [phone]. Topic: Quick Communication - Rx Refill/Question >> Sep 12, 2018 12:49 PM Keene Breath wrote: Medication: Dulaglutide (TRULICITY) 8.18 HT/0.9PJ SOPN  Patient called to request a refill for the above medication  Preferred Pharmacy (with phone number or street name): [place], [place] [phone] (Phone) [phone] (Fax)

## 2018-09-20 ENCOUNTER — Ambulatory Visit: Payer: Medicare PPO | Admitting: Family Medicine

## 2018-09-20 ENCOUNTER — Ambulatory Visit: Payer: Medicare PPO

## 2018-09-24 ENCOUNTER — Ambulatory Visit (INDEPENDENT_AMBULATORY_CARE_PROVIDER_SITE_OTHER): Payer: Medicare PPO | Admitting: Family Medicine

## 2018-09-24 ENCOUNTER — Encounter: Payer: Self-pay | Admitting: Family Medicine

## 2018-09-24 ENCOUNTER — Other Ambulatory Visit: Payer: Self-pay

## 2018-09-24 ENCOUNTER — Ambulatory Visit (INDEPENDENT_AMBULATORY_CARE_PROVIDER_SITE_OTHER): Payer: Medicare PPO

## 2018-09-24 VITALS — BP 155/100 | HR 78 | Ht 74.0 in | Wt 271.4 lb

## 2018-09-24 VITALS — BP 155/100 | HR 78

## 2018-09-24 DIAGNOSIS — M5136 Other intervertebral disc degeneration, lumbar region: Secondary | ICD-10-CM | POA: Diagnosis not present

## 2018-09-24 DIAGNOSIS — E269 Hyperaldosteronism, unspecified: Secondary | ICD-10-CM

## 2018-09-24 DIAGNOSIS — F1120 Opioid dependence, uncomplicated: Secondary | ICD-10-CM | POA: Diagnosis not present

## 2018-09-24 DIAGNOSIS — I700 Atherosclerosis of aorta: Secondary | ICD-10-CM

## 2018-09-24 DIAGNOSIS — Z0000 Encounter for general adult medical examination without abnormal findings: Secondary | ICD-10-CM | POA: Diagnosis not present

## 2018-09-24 DIAGNOSIS — I10 Essential (primary) hypertension: Secondary | ICD-10-CM

## 2018-09-24 DIAGNOSIS — I5189 Other ill-defined heart diseases: Secondary | ICD-10-CM

## 2018-09-24 DIAGNOSIS — F3341 Major depressive disorder, recurrent, in partial remission: Secondary | ICD-10-CM

## 2018-09-24 DIAGNOSIS — Z79899 Other long term (current) drug therapy: Secondary | ICD-10-CM

## 2018-09-24 DIAGNOSIS — N400 Enlarged prostate without lower urinary tract symptoms: Secondary | ICD-10-CM

## 2018-09-24 DIAGNOSIS — G4719 Other hypersomnia: Secondary | ICD-10-CM

## 2018-09-24 DIAGNOSIS — M1A9XX0 Chronic gout, unspecified, without tophus (tophi): Secondary | ICD-10-CM

## 2018-09-24 DIAGNOSIS — M1A4720 Other secondary chronic gout, left ankle and foot, without tophus (tophi): Secondary | ICD-10-CM

## 2018-09-24 DIAGNOSIS — G4733 Obstructive sleep apnea (adult) (pediatric): Secondary | ICD-10-CM

## 2018-09-24 DIAGNOSIS — E119 Type 2 diabetes mellitus without complications: Secondary | ICD-10-CM

## 2018-09-24 DIAGNOSIS — R600 Localized edema: Secondary | ICD-10-CM

## 2018-09-24 DIAGNOSIS — E7800 Pure hypercholesterolemia, unspecified: Secondary | ICD-10-CM

## 2018-09-24 MED ORDER — METOPROLOL SUCCINATE ER 100 MG PO TB24: 100.0000 mg | ORAL_TABLET | ORAL | 0 refills | Status: DC

## 2018-09-24 MED ORDER — ALLOPURINOL 300 MG PO TABS: 300.0000 mg | ORAL_TABLET | Freq: Every evening | ORAL | 1 refills | Status: DC

## 2018-09-24 MED ORDER — GABAPENTIN 300 MG PO CAPS: 300.0000 mg | ORAL_CAPSULE | Freq: Three times a day (TID) | ORAL | 0 refills | Status: DC

## 2018-09-24 MED ORDER — LOSARTAN POTASSIUM 100 MG PO TABS: 100.0000 mg | ORAL_TABLET | Freq: Every day | ORAL | 1 refills | Status: DC

## 2018-09-24 NOTE — Patient Instructions (Signed)
Evan Rivas , Thank you for taking time to come for your Medicare Wellness Visit. I appreciate your ongoing commitment to your health goals. Please review the following plan we discussed and let me know if I can assist you in the future.   Screening recommendations/referrals: Colonoscopy: done 02/02/12. Repeat in 2023 Recommended yearly ophthalmology/optometry visit for glaucoma screening and checkup Recommended yearly dental visit for hygiene and checkup  Vaccinations: Influenza vaccine: done 12/18/17 Pneumococcal vaccine: done 09/17/17 Tdap vaccine: n/a Shingles vaccine: Shingrix discussed. Please contact your pharmacy for coverage information.   Advanced directives: Advance directive discussed with you today. I have mailed a copy for you to complete at home and have notarized. Once this is complete please bring a copy in to our office so we can scan it into your chart.  Conditions/risks identified: Recommend increasing physical activity to 150 minutes per week.   Next appointment: Please follow up in one year for your Medicare Annual Wellness visit.    Preventive Care 31 Years and Older, Male Preventive care refers to lifestyle choices and visits with your health care provider that can promote health and wellness. What does preventive care include?  A yearly physical exam. This is also called an annual well check.  Dental exams once or twice a year.  Routine eye exams. Ask your health care provider how often you should have your eyes checked.  Personal lifestyle choices, including:  Daily care of your teeth and gums.  Regular physical activity.  Eating a healthy diet.  Avoiding tobacco and drug use.  Limiting alcohol use.  Practicing safe sex.  Taking low doses of aspirin every day.  Taking vitamin and mineral supplements as recommended by your health care provider. What happens during an annual well check? The services and screenings done by your health care  provider during your annual well check will depend on your age, overall health, lifestyle risk factors, and family history of disease. Counseling  Your health care provider may ask you questions about your:  Alcohol use.  Tobacco use.  Drug use.  Emotional well-being.  Home and relationship well-being.  Sexual activity.  Eating habits.  History of falls.  Memory and ability to understand (cognition).  Work and work Statistician. Screening  You may have the following tests or measurements:  Height, weight, and BMI.  Blood pressure.  Lipid and cholesterol levels. These may be checked every 5 years, or more frequently if you are over 30 years old.  Skin check.  Lung cancer screening. You may have this screening every year starting at age 60 if you have a 30-pack-year history of smoking and currently smoke or have quit within the past 15 years.  Fecal occult blood test (FOBT) of the stool. You may have this test every year starting at age 47.  Flexible sigmoidoscopy or colonoscopy. You may have a sigmoidoscopy every 5 years or a colonoscopy every 10 years starting at age 57.  Prostate cancer screening. Recommendations will vary depending on your family history and other risks.  Hepatitis C blood test.  Hepatitis B blood test.  Sexually transmitted disease (STD) testing.  Diabetes screening. This is done by checking your blood sugar (glucose) after you have not eaten for a while (fasting). You may have this done every 1-3 years.  Abdominal aortic aneurysm (AAA) screening. You may need this if you are a current or former smoker.  Osteoporosis. You may be screened starting at age 22 if you are at high risk. Talk with  your health care provider about your test results, treatment options, and if necessary, the need for more tests. Vaccines  Your health care provider may recommend certain vaccines, such as:  Influenza vaccine. This is recommended every year.  Tetanus,  diphtheria, and acellular pertussis (Tdap, Td) vaccine. You may need a Td booster every 10 years.  Zoster vaccine. You may need this after age 53.  Pneumococcal 13-valent conjugate (PCV13) vaccine. One dose is recommended after age 67.  Pneumococcal polysaccharide (PPSV23) vaccine. One dose is recommended after age 32. Talk to your health care provider about which screenings and vaccines you need and how often you need them. This information is not intended to replace advice given to you by your health care provider. Make sure you discuss any questions you have with your health care provider. Document Released: 04/16/2015 Document Revised: 12/08/2015 Document Reviewed: 01/19/2015 Elsevier Interactive Patient Education  2017 [place] Prevention in the Home Falls can cause injuries. They can happen to people of all ages. There are many things you can do to make your home safe and to help prevent falls. What can I do on the outside of my home?  Regularly fix the edges of walkways and driveways and fix any cracks.  Remove anything that might make you trip as you walk through a door, such as a raised step or threshold.  Trim any bushes or trees on the path to your home.  Use bright outdoor lighting.  Clear any walking paths of anything that might make someone trip, such as rocks or tools.  Regularly check to see if handrails are loose or broken. Make sure that both sides of any steps have handrails.  Any raised decks and porches should have guardrails on the edges.  Have any leaves, snow, or ice cleared regularly.  Use sand or salt on walking paths during winter.  Clean up any spills in your garage right away. This includes oil or grease spills. What can I do in the bathroom?  Use night lights.  Install grab bars by the toilet and in the tub and shower. Do not use towel bars as grab bars.  Use non-skid mats or decals in the tub or shower.  If you need to sit down in  the shower, use a plastic, non-slip stool.  Keep the floor dry. Clean up any water that spills on the floor as soon as it happens.  Remove soap buildup in the tub or shower regularly.  Attach bath mats securely with double-sided non-slip rug tape.  Do not have throw rugs and other things on the floor that can make you trip. What can I do in the bedroom?  Use night lights.  Make sure that you have a light by your bed that is easy to reach.  Do not use any sheets or blankets that are too big for your bed. They should not hang down onto the floor.  Have a firm chair that has side arms. You can use this for support while you get dressed.  Do not have throw rugs and other things on the floor that can make you trip. What can I do in the kitchen?  Clean up any spills right away.  Avoid walking on wet floors.  Keep items that you use a lot in easy-to-reach places.  If you need to reach something above you, use a strong step stool that has a grab bar.  Keep electrical cords out of the way.  Do not  use floor polish or wax that makes floors slippery. If you must use wax, use non-skid floor wax.  Do not have throw rugs and other things on the floor that can make you trip. What can I do with my stairs?  Do not leave any items on the stairs.  Make sure that there are handrails on both sides of the stairs and use them. Fix handrails that are broken or loose. Make sure that handrails are as long as the stairways.  Check any carpeting to make sure that it is firmly attached to the stairs. Fix any carpet that is loose or worn.  Avoid having throw rugs at the top or bottom of the stairs. If you do have throw rugs, attach them to the floor with carpet tape.  Make sure that you have a light switch at the top of the stairs and the bottom of the stairs. If you do not have them, ask someone to add them for you. What else can I do to help prevent falls?  Wear shoes that:  Do not have high  heels.  Have rubber bottoms.  Are comfortable and fit you well.  Are closed at the toe. Do not wear sandals.  If you use a stepladder:  Make sure that it is fully opened. Do not climb a closed stepladder.  Make sure that both sides of the stepladder are locked into place.  Ask someone to hold it for you, if possible.  Clearly mark and make sure that you can see:  Any grab bars or handrails.  First and last steps.  Where the edge of each step is.  Use tools that help you move around (mobility aids) if they are needed. These include:  Canes.  Walkers.  Scooters.  Crutches.  Turn on the lights when you go into a dark area. Replace any light bulbs as soon as they burn out.  Set up your furniture so you have a clear path. Avoid moving your furniture around.  If any of your floors are uneven, fix them.  If there are any pets around you, be aware of where they are.  Review your medicines with your doctor. Some medicines can make you feel dizzy. This can increase your chance of falling. Ask your doctor what other things that you can do to help prevent falls. This information is not intended to replace advice given to you by your health care provider. Make sure you discuss any questions you have with your health care provider. Document Released: 01/14/2009 Document Revised: 08/26/2015 Document Reviewed: 04/24/2014 Elsevier Interactive Patient Education  2017 [employer].

## 2018-09-24 NOTE — Patient Instructions (Signed)
Please call Cardiology for follow up appointment. Please call Dr. Ashby Dawes regarding your CPAP.

## 2018-09-24 NOTE — Progress Notes (Signed)
Subjective:   Evan Rivas is a 68 y.o. male who presents for an Initial Medicare Annual Wellness Visit.  Virtual Visit via Telephone Note  I connected with Evan Rivas on 09/24/18 at  2:10 PM EDT by telephone and verified that I am speaking with the correct person using two identifiers.  Medicare Annual Wellness visit completed telephonically due to Covid-19 pandemic.   Location: Patient: home Provider: office   I discussed the limitations, risks, security and privacy concerns of performing an evaluation and management service by telephone and the availability of in person appointments. The patient expressed understanding and agreed to proceed.  Some vital signs may be absent or patient reported.   Clemetine Marker, LPN   Review of Systems   Cardiac Risk Factors include: advanced age (>46men, >65 women);hypertension;male gender;dyslipidemia;diabetes mellitus;obesity (BMI >30kg/m2)    Objective:    Today's Vitals   09/24/18 1434  BP: (!) 155/100  Pulse: 78  Weight: 271 lb 6.4 oz (123.1 kg)  Height: 6\' 2"  (1.88 m)   Body mass index is 34.85 kg/m.  Advanced Directives 09/24/2018 04/23/2018 11/27/2017 08/30/2017 05/22/2017 05/07/2017 01/19/2017  Does Patient Have a Medical Advance Directive? No No No No No No No  Does patient want to make changes to medical advance directive? - - - - - - -  Copy of [place] in Chart? - - - - - - -  Would patient like information on creating a medical advance directive? Yes (MAU/Ambulatory/Procedural Areas - Information given) No - Patient declined No - Patient declined No - Patient declined Yes (MAU/Ambulatory/Procedural Areas - Information given) No - Patient declined -    Current Medications (verified) Outpatient Encounter Medications as of 09/24/2018  Medication Sig  . allopurinol (ZYLOPRIM) 300 MG tablet Take 1 tablet (300 mg total) by mouth every evening.  . ASPIRIN 81 PO Take 81 mg by mouth daily.   Evan Rivas  atorvastatin (LIPITOR) 40 MG tablet Take 1 tablet (40 mg total) by mouth at bedtime.  . Dulaglutide (TRULICITY) 4.96 PR/9.1MB SOPN Inject 0.75 mLs into the skin once a week.  Evan Rivas FLUoxetine (PROZAC) 40 MG capsule Take 1 capsule (40 mg total) by mouth daily.  . fluticasone (FLONASE) 50 MCG/ACT nasal spray Place 2 sprays into both nostrils daily. 2 sprays  . gabapentin (NEURONTIN) 300 MG capsule Take 1 capsule (300 mg total) by mouth 3 (three) times daily.  Evan Rivas glipiZIDE (GLUCOTROL) 10 MG tablet Take 1 tablet (10 mg total) by mouth daily before breakfast.  . glucose blood (ACCU-CHEK AVIVA PLUS) test strip Use as instructed  . hydrochlorothiazide (HYDRODIURIL) 25 MG tablet Take 1 tablet (25 mg total) by mouth daily.  Evan Rivas HYDROcodone-acetaminophen (NORCO) 10-325 MG tablet Take 1 tab BID; For 3rd daily dose: Take 1/2 tab Mon/Wed/Fri and take 1 tab Sun/Tues/Thurs/Sat  . hydrOXYzine (ATARAX/VISTARIL) 10 MG tablet Take 1 tablet (10 mg total) by mouth every 8 (eight) hours as needed for anxiety.  Evan Rivas losartan (COZAAR) 100 MG tablet Take 1 tablet (100 mg total) by mouth at bedtime.  . metFORMIN (GLUCOPHAGE) 1000 MG tablet Take 1,000 mg by mouth 2 (two) times daily with a meal.  . metoprolol succinate (TOPROL-XL) 100 MG 24 hr tablet Take 1 tablet (100 mg total) by mouth every morning.  . metoprolol succinate (TOPROL-XL) 25 MG 24 hr tablet Take 2 tablets (50 mg total) by mouth every morning.  . Naloxone HCl 0.4 MG/0.4ML SOAJ Injected once, then call 911.  As needed for  opiate overdose.  . spironolactone (ALDACTONE) 25 MG tablet Take 2 tablets (50 mg total) by mouth daily.  . tamsulosin (FLOMAX) 0.4 MG CAPS capsule Take 1 capsule (0.4 mg total) by mouth daily after supper.  Evan Rivas tiZANidine (ZANAFLEX) 4 MG tablet Take 1 tablet (4 mg total) by mouth at bedtime.  Evan Rivas amLODipine (NORVASC) 5 MG tablet Take 1 tablet (5 mg total) by mouth daily.   No facility-administered encounter medications on file as of 09/24/2018.      Allergies (verified) Celebrex  [celecoxib], Tetanus toxoids, and Tetanus-diphtheria toxoids td   History: Past Medical History:  Diagnosis Date  . Anxiety    panic attacks  . Arthritis   . Cancer (HCC)    lip  . Depression   . Diabetes mellitus without complication ([place])    type 2  . GERD (gastroesophageal reflux disease)   . Gout   . History of kidney stones   . Hyperlipidemia   . Hypertension   . Pneumonia    2014 and 2018 ( 2018-klebsiella pneumonia    Past Surgical History:  Procedure Laterality Date  . ARTHROSCOPIC REPAIR ACL Left   . CYSTOSCOPY W/ URETERAL STENT PLACEMENT Right 09/07/2014   Procedure: CYSTOSCOPY WITH STENT REPLACEMENT;  Surgeon: Evan Espy, MD;  Location: [facility];  Service: Urology;  Laterality: Right;  . CYSTOSCOPY/URETEROSCOPY/HOLMIUM LASER/STENT PLACEMENT Right 08/17/2014   Procedure: CYSTOSCOPY/URETEROSCOPY//STENT PLACEMENT/ attempt of lithotripsy;  Surgeon: Evan Espy, MD;  Location: [facility];  Service: Urology;  Laterality: Right;  . HERNIA REPAIR    . MOHS SURGERY     lip  . TOTAL KNEE ARTHROPLASTY Left 04/24/2016   Procedure: LEFT TOTAL KNEE ARTHROPLASTY;  Surgeon: Evan Arabian, MD;  Location: [facility];  Service: Orthopedics;  Laterality: Left;  Adductor Block  . TOTAL KNEE ARTHROPLASTY Right 10/23/2016   Procedure: RIGHT TOTAL KNEE ARTHROPLASTY;  Surgeon: Evan Arabian, MD;  Location: [facility];  Service: Orthopedics;  Laterality: Right;  . URETEROSCOPY WITH HOLMIUM LASER LITHOTRIPSY Right 09/07/2014   Procedure: URETEROSCOPY WITH HOLMIUM LASER LITHOTRIPSY;  Surgeon: Evan Espy, MD;  Location: [facility];  Service: Urology;  Laterality: Right;  Evan Rivas VASECTOMY     Family History  Problem Relation Age of Onset  . Hypertension Mother   . Cancer Mother   . Hyperlipidemia Mother   . Stroke Father   . Heart disease Father   . Depression Sister   . Hypertension Sister   . Hyperlipidemia Sister    Social History   Socioeconomic History  .  Marital status: Married    Spouse name: Evan Rivas  . Number of children: 1  . Years of education: Not on file  . Highest education level: Not on file  Occupational History  . Occupation: retired  Scientific laboratory technician  . Financial resource strain: Not hard at all  . Food insecurity    Worry: Never true    Inability: Never true  . Transportation needs    Medical: No    Non-medical: No  Tobacco Use  . Smoking status: Never Smoker  . Smokeless tobacco: Never Used  Substance and Sexual Activity  . Alcohol use: No  . Drug use: No  . Sexual activity: Yes  Lifestyle  . Physical activity    Days per week: 0 days    Minutes per session: 0 min  . Stress: Only a little  Relationships  . Social connections    Talks on phone: More than three times a week    Gets together: More  than three times a week    Attends religious service: 1 to 4 times per year    Active member of club or organization: Yes    Attends meetings of clubs or organizations: 1 to 4 times per year    Relationship status: Married  Other Topics Concern  . Not on file  Social History Narrative  . Not on file   Tobacco Counseling Counseling given: Not Answered   Clinical Intake:  Pre-visit preparation completed: Yes  Pain : No/denies pain     BMI - recorded: 34.85 Nutritional Status: BMI > 30  Obese Nutritional Risks: Other (Comment) Diabetes: Yes CBG done?: No Did pt. bring in CBG monitor from home?: No   Nutrition Risk Assessment:  Has the patient had any N/V/D within the last 2 months?  No  Does the patient have any non-healing wounds?  No  Has the patient had any unintentional weight loss or weight gain?  No   Diabetes:  Is the patient diabetic?  Yes  If diabetic, was a CBG obtained today?  No  Did the patient bring in their glucometer from home?  No  How often do you monitor your CBG's? Daily fasting in AM.   Financial Strains and Diabetes Management:  Are you having any financial strains with the  device, your supplies or your medication? No .  Does the patient want to be seen by Chronic Care Management for management of their diabetes?  No  Would the patient like to be referred to a Nutritionist or for Diabetic Management?  No   Diabetic Exams:  Diabetic Eye Exam: Completed 05/16/18 negative retinopathy.   Diabetic Foot Exam: Completed 05/08/17 . Pt has been advised about the importance in completing this exam.  How often do you need to have someone help you when you read instructions, pamphlets, or other written materials from your doctor or pharmacy?: 1 - Never  Interpreter Needed?: No  Information entered by :: Clemetine Marker LPN  Activities of Daily Living In your present state of health, do you have any difficulty performing the following activities: 09/24/2018 09/24/2018  Hearing? N N  Comment declines hearing aids -  Vision? N N  Difficulty concentrating or making decisions? N N  Walking or climbing stairs? N N  Dressing or bathing? N N  Doing errands, shopping? N N  Preparing Food and eating ? N -  Using the Toilet? N -  In the past six months, have you accidently leaked urine? Y -  Do you have problems with loss of bowel control? N -  Managing your Medications? N -  Managing your Finances? N -  Housekeeping or managing your Housekeeping? N -  Some recent data might be hidden     Immunizations and Health Maintenance Immunization History  Administered Date(s) Administered  . Influenza, High Dose Seasonal PF 12/22/2015, 01/22/2017, 12/18/2017  . Influenza, Seasonal, Injecte, Preservative Fre 12/18/2011  . Influenza,inj,Quad PF,6+ Mos 01/06/2013, 12/05/2013, 01/06/2015  . Influenza-Unspecified 12/02/2013  . Pneumococcal Conjugate-13 09/17/2017  . Pneumococcal Polysaccharide-23 07/20/2016   Health Maintenance Due  Topic Date Due  . FOOT EXAM  05/08/2018    Patient Care Team: Hubbard Hartshorn, FNP as PCP - General (Family Medicine) Cathi Roan, [facility]  (Pharmacist) Benedetto Goad, RN as Case Manager  Indicate any recent Medical Services you may have received from other than [facility] providers in the past year (date may be approximate).    Assessment:   This is a routine  wellness examination for Aalijah.  Hearing/Vision screen  Hearing Screening   125Hz  250Hz  500Hz  1000Hz  2000Hz  3000Hz  4000Hz  6000Hz  8000Hz   Right ear:           Left ear:           Comments: Pt denies hearing difficulty  Vision Screening Comments: Annual vision screenings with Dr. Gloriann Loan  Dietary issues and exercise activities discussed: Current Exercise Habits: The patient does not participate in regular exercise at present, Exercise limited by: orthopedic condition(s)  Goals   None    Depression Screen PHQ 2/9 Scores 09/24/2018 09/24/2018 08/21/2018 07/30/2018  PHQ - 2 Score 1 1 1  0  PHQ- 9 Score 1 1 1 1     Fall Risk Fall Risk  09/24/2018 09/24/2018 08/21/2018 07/30/2018 06/20/2018  Falls in the past year? 1 0 0 1 1  Number falls in past yr: 1 0 0 1 1  Injury with Fall? 1 0 0 1 1  Risk for fall due to : History of fall(s) - - - -  Follow up Falls prevention discussed - - Falls evaluation completed Falls evaluation completed   [place]:  Any stairs in or around the home? No  If so, do they handrails? No   Home free of loose throw rugs in walkways, pet beds, electrical cords, etc? Yes  Adequate lighting in your home to reduce risk of falls? Yes   ASSISTIVE DEVICES UTILIZED TO PREVENT FALLS:  Life alert? No  Use of a cane, walker or w/c? No  Grab bars in the bathroom? No  Shower chair or bench in shower? Yes Elevated toilet seat or a handicapped toilet? Yes   DME ORDERS:  DME order needed?  No   TIMED UP AND GO:  Was the test performed? No .    Education: Fall risk prevention has been discussed.  Intervention(s) required? No   Cognitive Function:     6CIT Screen 09/24/2018  What Year? 0 points  What month? 0  points  What time? 0 points  Count back from 20 0 points  Months in reverse 0 points  Repeat phrase 0 points  Total Score 0    Screening Tests Health Maintenance  Topic Date Due  . FOOT EXAM  05/08/2018  . Hepatitis C Screening  01/17/2024 (Originally 11/29/1950)  . TETANUS/TDAP  02/24/2028 (Originally 11/16/1969)  . INFLUENZA VACCINE  11/02/2018  . HEMOGLOBIN A1C  12/29/2018  . OPHTHALMOLOGY EXAM  05/17/2019  . COLONOSCOPY  02/01/2022  . PNA vac Low Risk Adult  Completed    Qualifies for Shingles Vaccine? Yes . Due for Shingrix. Education has been provided regarding the importance of this vaccine. Pt has been advised to call insurance company to determine out of pocket expense. Advised may also receive vaccine at local pharmacy or Health Dept. Verbalized acceptance and understanding.  Tdap: Although this vaccine is not a covered service during a Wellness Exam, does the patient still wish to receive this vaccine today?  No .  Pt allergic to TD.   Flu Vaccine: Up to date  Pneumococcal Vaccine: Up to date  Cancer Screenings:  Colorectal Screening: Completed 02/02/12. Repeat every 10 years.  Lung Cancer Screening: (Low Dose CT Chest recommended if Age 33-80 years, 30 pack-year currently smoking OR have quit w/in 15years.) does not qualify.   Additional Screening:  Hepatitis C Screening: does qualify. postponed  Vision Screening: Recommended annual ophthalmology exams for early detection of glaucoma and other disorders of  the eye. Is the patient up to date with their annual eye exam?  Yes  Who is the provider or what is the name of the office in which the pt attends annual eye exams? [facility] Screening: Recommended annual dental exams for proper oral hygiene  Community Resource Referral:  CRR required this visit?  No       Plan:    I have personally reviewed and addressed the Medicare Annual Wellness questionnaire and have noted the following in the  patient's chart:  A. Medical and social history B. Use of alcohol, tobacco or illicit drugs  C. Current medications and supplements D. Functional ability and status E.  Nutritional status F.  Physical activity G. Advance directives H. List of other physicians I.  Hospitalizations, surgeries, and ER visits in previous 12 months J.  [place] such as hearing and vision if needed, cognitive and depression L. Referrals and appointments   In addition, I have reviewed and discussed with patient certain preventive protocols, quality metrics, and best practice recommendations. A written personalized care plan for preventive services as well as general preventive health recommendations were provided to patient.   Signed,  Clemetine Marker, LPN Nurse Health Advisor   Nurse Notes: pt doing well and appreciative of visit today. Same day visit with [name] NP for follow up.

## 2018-09-24 NOTE — Progress Notes (Signed)
Name: Evan Rivas   MRN: [id number]    DOB: [birth year]   Date:09/24/2018       Progress Note  Subjective  Chief Complaint  Chief Complaint  Patient presents with  . Follow-up  . Diabetes    187 BG    I connected with  [name]  on 09/24/18 at  1:00 PM EDT by a video enabled telemedicine application and verified that I am speaking with the correct person using two identifiers.  I discussed the limitations of evaluation and management by telemedicine and the availability of in person appointments. The patient expressed understanding and agreed to proceed. Staff also discussed with the patient that there may be a patient responsible charge related to this service. Patient Location: Home Provider Location: Home Additional Individuals present: None  HPI  Chronic Pain Management: - DDD/chronic back pain: Has struggled with RIGHT lower back pain for many years. He fell out of bed recently (lost his balance, was awake), and his lower LEFT back is now hurting.  There is no radiation into the legs, no weakness in legs. He has done PT for his back in the past and it seemed to help once he was able to tolerate the exercises.  He has never had any spinal surgeries; Dr. [name] did not do any procedures with [name] as Mr. Dudas declined. - He is aware that my Rx of his pain medication is temporary to help with his significant financial burden.  We will re-evaluate our pain contract and need for my assistance at the end of the 2020 Calendar year.  - Discussed Narcan auto-injector. - He decreased to taking 1/2 tablet norco for 1 of his daily doses three times a week.  He is otherwise taking 1 tablet of 10mg -325mg  Norco TID.  He has noticed a bit of a spike in his pain on days where he takes the 1/2 tablet, so we will not go down any more at this time. Controlled Substance Database is checked and no suspicious findings. Start date of new Rx to be 10/04/2018, and we will proceed with  monthly Rx from there.    Hyperaldosteronism:  States he saw endocrinologist in March and they are now doing further testing to rule out tumors on his adrenal glands since blood sugars are not responding well to treatment. He was supposed to go to [facility] for a procedure in [place] it was cancelled due to the covid 19 pandemic.  He is now going next week for this procedure. He states that due to his aldosterone levels being so erratic, they are concerned about other causes.  DM: Was referred to endocrinology for DM, but he has not had A1C checked by [name].  Lowest BG has been 70's, highest has been 187 (after eating ice cream).  He is taking trulicity, metformin, and still taking glipizide 10mg  once daily without issues.  Denies polydipsia, polyphagia.  Occasional polyuria. Needs A1C and Lipid panel today.  HTN:  However patient checks BP at home daily and has consistent readings within goal, however he had to stop his spironolactone prior to adrenal procedure upcoming and he ran out of his losartan a few days ago. - Today at home BP's are 145/93, 148/91. -doestake medications as prescribed, denies any missed doses. Is followed by cardiology for management.   -taking medications as instructed, no medication side effects noted, no TIAs, no chest pain on exertion, no dyspnea on exertion.- DASH diet discussed - ptdoes notfollow a  low sodium diet; eats diet heavy in carbs, processed foods but does report trying to bake most meats.  - The followingarecontributing factors: sedentary lifestyle, saturated fats in diet, high sodium diet - Unchanged at this time - needs to restart Losartan.  Obesity: Weight Management History: difficulty managing weight even when he follows low carb diet and exercises due to COVID-19 pandemic.  Diet:heavy in carbs and processed foods, sugars. States he sometimes overeats but tries to manage portions as much as possible.  Exercise:not active currently Co-Morbid  Conditions:diabetes mellitus, dyslipidemias and hypertension; 2 or more of these conditions combined with BMI >30 is considered morbid obesity; is this diagnosis appropriate and/or added to patient's problem list?Yes Unchanged, stable.   BLE Edema/Diastolic Dysfunction/HLD:Seeing Dr. [name] and [name] for management now - has been off of sprionolactone that was prescribed by them r/t procedure he is scheduled to have.  He noticed increase in swelling when she stopped the spironolactone, but states it is not severe.  No chest pain or shortness of breath.   Compression stockings are worn 6 days a week for atleast 10 hours at a time.  Leaves them off one day/week to "give legs a break".  No chest pain or shortness of breath. Taking statin therapy as prescribed.  Due for Follow up with cardiology and he is reminded of this need.  Depression and Anxiety: Currently on prozac 40mg  and doing well. States anxiety is normally well controlled, but reports an increase in it recently due to his wife being out of work and increasing medical bills. Has hydroxyzine prn for anxiety, Is taking it about every other day which is working well for him.We discussed possible sedative effects, and he verbalizes understanding.  Stable and unchanged   Office Visit from 09/24/2018 in [facility]  PHQ-9 Total Score  1      Daytime Sleepiness/OSA: still dealing with excessive daytime drowsiness.  Using cpap nightly now for sleep apnea for about 5.5 hours a night - started on 04/25/18; has not seen a decrease in daytime drowsiness since using this -Using nasal mask instead of face mask, feels like he wakes up often due to being a mouth breather and being concerned he is not breathing through his nose.   -Endocrinology performing further testing to determine if there is an underlying cause related to adrenal glands. - Seeing Dr. Ashby Dawes for follow up on OSA.  Discussed armodafonil with the patient, but  unfortunately, it is cost prohibitive at this time. - Needs follow up with Dr. Juanell Fairly.  BPH: Continues to have no issues with nocturia. Doing well on flomax, does have some increased urinary frequency after taking his flomax.Will switch flomax to lunch time to see if this helps with his urinary frequency.  Gout: Taking Allopurinol daily, no recent flares. Unchanged.  Patient Active Problem List   Diagnosis Date Noted  . Hyperaldosteronism ([place]) 06/24/2018  . Diastolic dysfunction 09/32/3557  . OSA (obstructive sleep apnea) 06/24/2018  . Excessive daytime sleepiness 06/24/2018  . Aortic atherosclerosis ([place]) 06/17/2018  . Lower extremity edema 12/18/2017  . BPH without urinary obstruction 06/03/2015  . Depression 03/05/2015  . Hyperlipidemia 09/03/2014  . Major depressive disorder, recurrent episode, moderate ([place]) 09/03/2014  . Chronic low back pain 09/03/2014  . Arthralgia of both knees 09/03/2014  . Type 2 diabetes mellitus without complication ([place]) 32/20/2542  . Allergic rhinitis 07/04/2014  . Anxiety 07/04/2014  . Continuous opioid dependence ([place]) 07/04/2014  . Diabetes mellitus type 2 in obese (  [place]) 07/04/2014  . DDD (degenerative disc disease), lumbar 07/04/2014  . Gout 07/04/2014  . Essential hypertension 07/04/2014  . Flu vaccine need 07/04/2014  . Morbid obesity ([place]) 07/04/2014  . OA (osteoarthritis) of knee 07/04/2014  . Neuralgia neuritis, sciatic nerve 07/04/2014  . Arthralgia of multiple joints 07/04/2014  . H/O malignant neoplasm of skin 12/24/2013    Past Surgical History:  Procedure Laterality Date  . ARTHROSCOPIC REPAIR ACL Left   . CYSTOSCOPY W/ URETERAL STENT PLACEMENT Right 09/07/2014   Procedure: CYSTOSCOPY WITH STENT REPLACEMENT;  Surgeon: Hollice Espy, MD;  Location: [facility];  Service: Urology;  Laterality: Right;  . CYSTOSCOPY/URETEROSCOPY/HOLMIUM LASER/STENT PLACEMENT Right 08/17/2014   Procedure: CYSTOSCOPY/URETEROSCOPY//STENT PLACEMENT/ attempt  of lithotripsy;  Surgeon: Hollice Espy, MD;  Location: [facility];  Service: Urology;  Laterality: Right;  . HERNIA REPAIR    . MOHS SURGERY     lip  . TOTAL KNEE ARTHROPLASTY Left 04/24/2016   Procedure: LEFT TOTAL KNEE ARTHROPLASTY;  Surgeon: Gaynelle Arabian, MD;  Location: [facility];  Service: Orthopedics;  Laterality: Left;  Adductor Block  . TOTAL KNEE ARTHROPLASTY Right 10/23/2016   Procedure: RIGHT TOTAL KNEE ARTHROPLASTY;  Surgeon: Gaynelle Arabian, MD;  Location: [facility];  Service: Orthopedics;  Laterality: Right;  . URETEROSCOPY WITH HOLMIUM LASER LITHOTRIPSY Right 09/07/2014   Procedure: URETEROSCOPY WITH HOLMIUM LASER LITHOTRIPSY;  Surgeon: Hollice Espy, MD;  Location: [facility];  Service: Urology;  Laterality: Right;  [name] VASECTOMY      Family History  Problem Relation Age of Onset  . Hypertension Mother   . Cancer Mother   . Hyperlipidemia Mother   . Stroke Father   . Heart disease Father   . Depression Sister   . Hypertension Sister   . Hyperlipidemia Sister     Social History   Socioeconomic History  . Marital status: Married    Spouse name: Hoyle Sauer  . Number of children: 1  . Years of education: Not on file  . Highest education level: Not on file  Occupational History  . Occupation: retired  Scientific laboratory technician  . Financial resource strain: Not hard at all  . Food insecurity    Worry: Never true    Inability: Never true  . Transportation needs    Medical: No    Non-medical: No  Tobacco Use  . Smoking status: Never Smoker  . Smokeless tobacco: Never Used  Substance and Sexual Activity  . Alcohol use: No  . Drug use: No  . Sexual activity: Yes  Lifestyle  . Physical activity    Days per week: 4 days    Minutes per session: 60 min  . Stress: Only a little  Relationships  . Social connections    Talks on phone: More than three times a week    Gets together: More than three times a week    Attends religious service: 1 to 4 times per year    Active member of club or  organization: Yes    Attends meetings of clubs or organizations: 1 to 4 times per year    Relationship status: Married  . Intimate partner violence    Fear of current or ex partner: No    Emotionally abused: No    Physically abused: Not on file    Forced sexual activity: No  Other Topics Concern  . Not on file  Social History Narrative  . Not on file     Current Outpatient Medications:  .  allopurinol (ZYLOPRIM) 300 MG tablet,  Take 1 tablet (300 mg total) by mouth every evening., Disp: 90 tablet, Rfl: 1 .  ASPIRIN 81 PO, Take 81 mg by mouth daily. , Disp: , Rfl:  .  atorvastatin (LIPITOR) 40 MG tablet, Take 1 tablet (40 mg total) by mouth at bedtime., Disp: 90 tablet, Rfl: 1 .  Dulaglutide (TRULICITY) 1.61 WR/6.0AV SOPN, Inject 0.75 mLs into the skin once a week., Disp: 2 mL, Rfl: 3 .  FLUoxetine (PROZAC) 40 MG capsule, Take 1 capsule (40 mg total) by mouth daily., Disp: 90 capsule, Rfl: 1 .  fluticasone (FLONASE) 50 MCG/ACT nasal spray, Place 2 sprays into both nostrils daily. 2 sprays, Disp: 16 g, Rfl: 6 .  glipiZIDE (GLUCOTROL) 10 MG tablet, Take 1 tablet (10 mg total) by mouth daily before breakfast., Disp: 90 tablet, Rfl: 1 .  hydrochlorothiazide (HYDRODIURIL) 25 MG tablet, Take 1 tablet (25 mg total) by mouth daily., Disp: 90 tablet, Rfl: 1 .  HYDROcodone-acetaminophen (NORCO) 10-325 MG tablet, Take 1 tab BID; For 3rd daily dose: Take 1/2 tab Mon/Wed/Fri and take 1 tab Sun/Tues/Thurs/Sat, Disp: 87 tablet, Rfl: 0 .  hydrOXYzine (ATARAX/VISTARIL) 10 MG tablet, Take 1 tablet (10 mg total) by mouth every 8 (eight) hours as needed for anxiety., Disp: 30 tablet, Rfl: 1 .  losartan (COZAAR) 100 MG tablet, Take 1 tablet (100 mg total) by mouth at bedtime., Disp: 90 tablet, Rfl: 0 .  metFORMIN (GLUCOPHAGE XR) 750 MG 24 hr tablet, Take 2 tablets (1,500 mg total) by mouth daily with breakfast., Disp: 14 tablet, Rfl: 0 .  metoprolol succinate (TOPROL-XL) 100 MG 24 hr tablet, Take 1 tablet (100  mg total) by mouth every morning., Disp: 90 tablet, Rfl: 1 .  metoprolol succinate (TOPROL-XL) 25 MG 24 hr tablet, Take 2 tablets (50 mg total) by mouth every morning., Disp: 180 tablet, Rfl: 3 .  Naloxone HCl 0.4 MG/0.4ML SOAJ, Injected once, then call 911.  As needed for opiate overdose., Disp: 1 Package, Rfl: 1 .  tamsulosin (FLOMAX) 0.4 MG CAPS capsule, Take 1 capsule (0.4 mg total) by mouth daily after supper., Disp: 90 capsule, Rfl: 1 .  tiZANidine (ZANAFLEX) 4 MG tablet, Take 1 tablet (4 mg total) by mouth at bedtime., Disp: 90 tablet, Rfl: 1 .  amLODipine (NORVASC) 5 MG tablet, Take 1 tablet (5 mg total) by mouth daily., Disp: 90 tablet, Rfl: 3 .  gabapentin (NEURONTIN) 300 MG capsule, Take 1 capsule (300 mg total) by mouth 3 (three) times daily., Disp: 270 capsule, Rfl: 0 .  glucose blood (ACCU-CHEK AVIVA PLUS) test strip, Use as instructed, Disp: 100 each, Rfl: 12 .  spironolactone (ALDACTONE) 25 MG tablet, Take 2 tablets (50 mg total) by mouth daily. (Patient not taking: Reported on 09/24/2018), Disp: 90 tablet, Rfl: 3  Allergies  Allergen Reactions  . Celebrex  [Celecoxib]     GI  . Tetanus Toxoids Other (See Comments)    unkown  . Tetanus-Diphtheria Toxoids Td     Other reaction(s): Unknown    I personally reviewed active problem list, medication list, allergies with the patient/caregiver today.   ROS Constitutional: Negative for fever or weight change.  Respiratory: Negative for cough; occasional shortness of breath with much exertion.   Cardiovascular: Negative for chest pain or palpitations.  Gastrointestinal: Negative for abdominal pain, no bowel changes.  Musculoskeletal: Negative for gait problem or joint swelling.  Skin: Negative for rash.  Neurological: Negative for dizziness or headache.  No other specific complaints in a complete review of  systems (except as listed in HPI above).   Objective  Virtual encounter, vitals not obtained.  There is no height or  weight on file to calculate BMI.  Physical Exam Constitutional: Patient appears well-developed and well-nourished. No distress.  HENT: Head: Normocephalic and atraumatic.  Neck: Normal range of motion. Pulmonary/Chest: Effort normal. No respiratory distress. Speaking in complete sentences Neurological: Pt is alert and oriented to person, place, and time. Coordination, speech are normal.  Psychiatric: Patient has a normal mood and affect. behavior is normal. Judgment and thought content normal.   No results found for this or any previous visit (from the past 72 hour(s)).  PHQ2/9: Depression screen [facility] 2/9 09/24/2018 08/21/2018 07/30/2018 06/20/2018 04/23/2018  Decreased Interest 0 0 0 0 0  Down, Depressed, Hopeless 1 1 0 0 0  PHQ - 2 Score 1 1 0 0 0  Altered sleeping 0 0 1 1 -  Tired, decreased energy 0 0 0 1 -  Change in appetite 0 0 0 0 -  Feeling bad or failure about yourself  0 0 0 0 -  Trouble concentrating 0 0 0 0 -  Moving slowly or fidgety/restless 0 0 0 0 -  Suicidal thoughts 0 0 0 0 -  PHQ-9 Score 1 1 1 2  -  Difficult doing work/chores Not difficult at all Not difficult at all Not difficult at all Not difficult at all -  Some recent data might be hidden   PHQ-2/9 Result is negative.    Fall Risk: Fall Risk  09/24/2018 08/21/2018 07/30/2018 06/20/2018 04/23/2018  Falls in the past year? 0 0 1 1 0  Number falls in past yr: 0 0 1 1 0  Injury with Fall? 0 0 1 1 0  Follow up - - Falls evaluation completed Falls evaluation completed -    Assessment & Plan 1. DDD (degenerative disc disease), lumbar - He is taking 1/2 tablet for his 3rd daily dose 3 days a week now, tolerating okay. - gabapentin (NEURONTIN) 300 MG capsule; Take 1 capsule (300 mg total) by mouth 3 (three) times daily.  Dispense: 270 capsule; Refill: 0 - HYDROcodone-acetaminophen (NORCO) 10-325 MG tablet; Take 1 tab BID; For 3rd daily dose: Take 1/2 tab Mon/Wed/Fri and take 1 tab Sun/Tues/Thurs/Sat  Dispense: 87  tablet; Refill: 0 - HYDROcodone-acetaminophen (NORCO) 10-325 MG tablet; Take 1 tab BID; For 3rd daily dose: Take 1/2 tab Mon/Wed/Fri and take 1 tab Sun/Tues/Thurs/Sat  Dispense: 87 tablet; Refill: 0 - HYDROcodone-acetaminophen (NORCO) 10-325 MG tablet; Take 1 tab BID; For 3rd daily dose: Take 1/2 tab Mon/Wed/Fri and take 1 tab Sun/Tues/Thurs/Sat  Dispense: 87 tablet; Refill: 0  2. Continuous opioid dependence (HCC) - gabapentin (NEURONTIN) 300 MG capsule; Take 1 capsule (300 mg total) by mouth 3 (three) times daily.  Dispense: 270 capsule; Refill: 0 - HYDROcodone-acetaminophen (NORCO) 10-325 MG tablet; Take 1 tab BID; For 3rd daily dose: Take 1/2 tab Mon/Wed/Fri and take 1 tab Sun/Tues/Thurs/Sat  Dispense: 87 tablet; Refill: 0 - HYDROcodone-acetaminophen (NORCO) 10-325 MG tablet; Take 1 tab BID; For 3rd daily dose: Take 1/2 tab Mon/Wed/Fri and take 1 tab Sun/Tues/Thurs/Sat  Dispense: 87 tablet; Refill: 0 - HYDROcodone-acetaminophen (NORCO) 10-325 MG tablet; Take 1 tab BID; For 3rd daily dose: Take 1/2 tab Mon/Wed/Fri and take 1 tab Sun/Tues/Thurs/Sat  Dispense: 87 tablet; Refill: 0  3. Controlled substance agreement signed - gabapentin (NEURONTIN) 300 MG capsule; Take 1 capsule (300 mg total) by mouth 3 (three) times daily.  Dispense: 270 capsule; Refill: 0 -  HYDROcodone-acetaminophen (NORCO) 10-325 MG tablet; Take 1 tab BID; For 3rd daily dose: Take 1/2 tab Mon/Wed/Fri and take 1 tab Sun/Tues/Thurs/Sat  Dispense: 87 tablet; Refill: 0 - HYDROcodone-acetaminophen (NORCO) 10-325 MG tablet; Take 1 tab BID; For 3rd daily dose: Take 1/2 tab Mon/Wed/Fri and take 1 tab Sun/Tues/Thurs/Sat  Dispense: 87 tablet; Refill: 0 - HYDROcodone-acetaminophen (NORCO) 10-325 MG tablet; Take 1 tab BID; For 3rd daily dose: Take 1/2 tab Mon/Wed/Fri and take 1 tab Sun/Tues/Thurs/Sat  Dispense: 87 tablet; Refill: 0  4. Hyperaldosteronism ([place]) - Continue with endocrine specialty at [facility].  5. Type 2 diabetes mellitus without  complication, without long-term current use of insulin ([place]) - Seeing endocrinology, tolerating medications without issue.  6. Essential hypertension - losartan (COZAAR) 100 MG tablet; Take 1 tablet (100 mg total) by mouth at bedtime.  Dispense: 90 tablet; Refill: 1 - metoprolol succinate (TOPROL-XL) 100 MG 24 hr tablet; Take 1 tablet (100 mg total) by mouth every morning.  Dispense: 90 tablet; Refill: 0  7. Diastolic dysfunction - Seeing cardiology  8. OSA (obstructive sleep apnea) - Wearing CPAP  9. Excessive daytime sleepiness - Wearing CPAP, have talked extensively about his gabapentin, narcotic medications (which we are working to reduce usage slowly), and how these can contribute to his daytime fatigue as well.  10. Morbid obesity ([place]) - Discussed importance of 150 minutes of physical activity weekly, eat two servings of fish weekly, eat one serving of tree nuts ( cashews, pistachios, pecans, almonds.[name]) every other day, eat 6 servings of fruit/vegetables daily and drink plenty of water and avoid sweet beverages.   11. Pure hypercholesterolemia - taking statin, encouraged to continue as he is tolerating well.  12. BPH without urinary obstruction - Stable  13. Aortic atherosclerosis (HCC) One statin  14. Lower extremity edema - Stable, continue compression stockings  15. Recurrent major depressive disorder, in partial remission ([place]) - Doing well currently with prozac, struggles with fatigue  16. Chronic gout without tophus, unspecified cause, unspecified site - Stable on allopurinol  I discussed the assessment and treatment plan with the patient. The patient was provided an opportunity to ask questions and all were answered. The patient agreed with the plan and demonstrated an understanding of the instructions.  The patient was advised to call back or seek an in-person evaluation if the symptoms worsen or if the condition fails to improve as anticipated.  I provided 38  minutes of non-face-to-face time during this encounter.

## 2018-09-26 ENCOUNTER — Other Ambulatory Visit: Payer: Self-pay | Admitting: Emergency Medicine

## 2018-09-26 DIAGNOSIS — E119 Type 2 diabetes mellitus without complications: Secondary | ICD-10-CM

## 2018-09-26 MED ORDER — TRULICITY 0.75 MG/0.5ML ~~LOC~~ SOAJ: 0.7500 mL | SUBCUTANEOUS | 3 refills | Status: DC

## 2018-09-26 MED ORDER — HYDROCODONE-ACETAMINOPHEN 10-325 MG PO TABS: ORAL_TABLET | ORAL | 0 refills | Status: DC

## 2018-09-26 MED ORDER — HYDROCODONE-ACETAMINOPHEN 10-325 MG PO TABS: ORAL_TABLET | ORAL | 0 refills | Status: AC

## 2018-10-01 ENCOUNTER — Telehealth: Payer: Self-pay | Admitting: Family Medicine

## 2018-10-01 DIAGNOSIS — E119 Type 2 diabetes mellitus without complications: Secondary | ICD-10-CM

## 2018-10-01 MED ORDER — TRULICITY 0.75 MG/0.5ML ~~LOC~~ SOAJ: 0.7500 mL | SUBCUTANEOUS | 3 refills | Status: DC

## 2018-10-01 NOTE — Telephone Encounter (Signed)
Pt would like a callback regarding Dulaglutide (TRULICITY) 4.17 TH/9.9PD SOPN Stated it should not have been sent to [facility] but instead to the manufacturer. Please advise.

## 2018-10-01 NOTE — Telephone Encounter (Signed)
Printed. May be at front desk printer.

## 2018-10-01 NOTE — Telephone Encounter (Signed)
Can you print this off so I can send?

## 2018-11-18 ENCOUNTER — Ambulatory Visit: Payer: Medicare PPO

## 2018-11-19 ENCOUNTER — Telehealth: Payer: Self-pay | Admitting: Family Medicine

## 2018-11-19 NOTE — Telephone Encounter (Signed)
Patient would like letter stating that it is medically necessary for him to continue to work out.  Patient stated he needs this to give to the [facility] so that he can use their facility.

## 2018-11-20 ENCOUNTER — Ambulatory Visit (INDEPENDENT_AMBULATORY_CARE_PROVIDER_SITE_OTHER): Payer: Medicare PPO | Admitting: Emergency Medicine

## 2018-11-20 ENCOUNTER — Other Ambulatory Visit: Payer: Self-pay | Admitting: Emergency Medicine

## 2018-11-20 ENCOUNTER — Other Ambulatory Visit: Payer: Self-pay

## 2018-11-20 ENCOUNTER — Encounter: Payer: Self-pay | Admitting: Family Medicine

## 2018-11-20 DIAGNOSIS — G8929 Other chronic pain: Secondary | ICD-10-CM

## 2018-11-20 DIAGNOSIS — Z23 Encounter for immunization: Secondary | ICD-10-CM

## 2018-11-20 MED ORDER — TIZANIDINE HCL 4 MG PO TABS: 4.0000 mg | ORAL_TABLET | Freq: Every day | ORAL | 1 refills | Status: DC

## 2018-11-20 NOTE — Telephone Encounter (Signed)
Please provide a note allowing him to return to exercise - this will significantly improve his back pain.  Encourage him to wear a mask, practice social distancing, and clean off all machines before and after use.  His risk of contracting COVID-19 does increase by going to the gym.

## 2018-11-20 NOTE — Telephone Encounter (Signed)
Note ready for pick up

## 2018-11-26 NOTE — Progress Notes (Signed)
Follow-up Outpatient Visit Date: 11/27/2018  Primary Care Provider: [facility], [address] [place] [address]  Chief Complaint: Retention and leg swelling  HPI:  Evan Rivas is a 68 y.o. year-old male with history of diastolic dysfunction, HTN, HLD, DM2, cancer of the lip, anxiety, depression, gout, and arthritis with chronic pain, who presents for follow-up of leg swelling.  He was last seen by Christell Faith, PA, in 03/2018.  In the meantime, he has been evaluated by [name] (endocrinology) due to hyperaldosteronism.  Adrenal vein sampling was performed at [facility], with ultimate diagnosis of bilateral upper adrenal hyperplasia being made.  He is currently on Spironolactone 50 mg daily, which has helped improve his leg swelling and blood pressure.  In general, his BP is well controlled with numbers in the 130-140/80-90 range.  Interestingly, he notes that his blood pressure seems to be lower when his glucose is elevated.  He continues to have intermittent leg swelling, particularly when he does not wear compression stockings on a regular basis.  He denies chest pain, shortness of breath, palpitations, and lightheadedness.  --------------------------------------------------------------------------------------------------  Past Medical History:  Diagnosis Date  . Anxiety    panic attacks  . Arthritis   . Cancer (HCC)    lip  . Depression   . Diabetes mellitus without complication ([place])    type 2  . GERD (gastroesophageal reflux disease)   . Gout   . History of kidney stones   . Hyperaldosteronism ([place])    bilateral adrenal hyperplasia  . Hyperlipidemia   . Hypertension   . Pneumonia    2014 and 2018 ( 2018-klebsiella pneumonia    Past Surgical History:  Procedure Laterality Date  . ARTHROSCOPIC REPAIR ACL Left   . CYSTOSCOPY W/ URETERAL STENT PLACEMENT Right 09/07/2014   Procedure: CYSTOSCOPY WITH STENT REPLACEMENT;  Surgeon: Hollice Espy, MD;  Location:  [facility];  Service: Urology;  Laterality: Right;  . CYSTOSCOPY/URETEROSCOPY/HOLMIUM LASER/STENT PLACEMENT Right 08/17/2014   Procedure: CYSTOSCOPY/URETEROSCOPY//STENT PLACEMENT/ attempt of lithotripsy;  Surgeon: Hollice Espy, MD;  Location: [facility];  Service: Urology;  Laterality: Right;  . HERNIA REPAIR    . MOHS SURGERY     lip  . TOTAL KNEE ARTHROPLASTY Left 04/24/2016   Procedure: LEFT TOTAL KNEE ARTHROPLASTY;  Surgeon: Gaynelle Arabian, MD;  Location: [facility];  Service: Orthopedics;  Laterality: Left;  Adductor Block  . TOTAL KNEE ARTHROPLASTY Right 10/23/2016   Procedure: RIGHT TOTAL KNEE ARTHROPLASTY;  Surgeon: Gaynelle Arabian, MD;  Location: [facility];  Service: Orthopedics;  Laterality: Right;  . URETEROSCOPY WITH HOLMIUM LASER LITHOTRIPSY Right 09/07/2014   Procedure: URETEROSCOPY WITH HOLMIUM LASER LITHOTRIPSY;  Surgeon: Hollice Espy, MD;  Location: [facility];  Service: Urology;  Laterality: Right;  [name] VASECTOMY      Current Meds  Medication Sig  . allopurinol (ZYLOPRIM) 300 MG tablet Take 1 tablet (300 mg total) by mouth every evening.  [name] amLODipine (NORVASC) 5 MG tablet Take 1 tablet (5 mg total) by mouth daily.  . ASPIRIN 81 PO Take 81 mg by mouth daily.   [name] atorvastatin (LIPITOR) 40 MG tablet Take 1 tablet (40 mg total) by mouth at bedtime.  . Dulaglutide (TRULICITY) A999333 [id number] SOPN Inject 0.75 mLs into the skin once a week.  [name] FLUoxetine (PROZAC) 40 MG capsule Take 1 capsule (40 mg total) by mouth daily.  . fluticasone (FLONASE) 50 MCG/ACT nasal spray Place 2 sprays into both nostrils daily. 2 sprays  . gabapentin (NEURONTIN)  300 MG capsule Take 1 capsule (300 mg total) by mouth 3 (three) times daily.  [name] glipiZIDE (GLUCOTROL) 10 MG tablet Take 1 tablet (10 mg total) by mouth daily before breakfast.  . glucose blood (ACCU-CHEK AVIVA PLUS) test strip Use as instructed  . hydrochlorothiazide (HYDRODIURIL) 25 MG tablet Take 1 tablet (25 mg total) by mouth daily.  [name]  HYDROcodone-acetaminophen (NORCO) 10-325 MG tablet Take 1 tab BID; For 3rd daily dose: Take 1/2 tab Mon/Wed/Fri and take 1 tab Sun/Tues/Thurs/Sat  . [START ON 12/03/2018] HYDROcodone-acetaminophen (NORCO) 10-325 MG tablet Take 1 tab BID; For 3rd daily dose: Take 1/2 tab Mon/Wed/Fri and take 1 tab Sun/Tues/Thurs/Sat  . hydrOXYzine (ATARAX/VISTARIL) 10 MG tablet Take 1 tablet (10 mg total) by mouth every 8 (eight) hours as needed for anxiety.  [name] losartan (COZAAR) 100 MG tablet Take 1 tablet (100 mg total) by mouth at bedtime.  . metFORMIN (GLUCOPHAGE) 1000 MG tablet Take 1,000 mg by mouth 2 (two) times daily with a meal.  . metoprolol succinate (TOPROL-XL) 100 MG 24 hr tablet Take 1 tablet (100 mg total) by mouth every morning. (Patient taking differently: Take 150 mg by mouth every morning. )  . metoprolol succinate (TOPROL-XL) 25 MG 24 hr tablet Take 2 tablets (50 mg total) by mouth every morning.  . Naloxone HCl 0.4 MG/0.4ML SOAJ Injected once, then call 911.  As needed for opiate overdose.  . spironolactone (ALDACTONE) 25 MG tablet Take 2 tablets (50 mg total) by mouth daily.  . tamsulosin (FLOMAX) 0.4 MG CAPS capsule Take 1 capsule (0.4 mg total) by mouth daily after supper.  [name] tiZANidine (ZANAFLEX) 4 MG tablet Take 1 tablet (4 mg total) by mouth at bedtime.    Allergies: Celebrex  [celecoxib], Tetanus toxoids, and Tetanus-diphtheria toxoids td  Social History   Tobacco Use  . Smoking status: Never Smoker  . Smokeless tobacco: Never Used  Substance Use Topics  . Alcohol use: No  . Drug use: No    Family History  Problem Relation Age of Onset  . Hypertension Mother   . Cancer Mother   . Hyperlipidemia Mother   . Stroke Father   . Heart disease Father   . Depression Sister   . Hypertension Sister   . Hyperlipidemia Sister     Review of Systems: A 12-system review of systems was performed and was negative except as noted in the HPI.   --------------------------------------------------------------------------------------------------  Physical Exam: BP 128/80 (BP Location: Left Arm, Patient Position: Sitting, Cuff Size: Large)   Pulse 80   Ht 6\' 2"  (1.88 m)   Wt 275 lb (124.7 kg)   BMI 35.31 kg/m   General: NAD. HEENT: No conjunctival pallor or scleral icterus.  Facemask in place. Neck: Supple without lymphadenopathy, thyromegaly, JVD, or HJR. Lungs: Normal work of breathing. Clear to auscultation bilaterally without wheezes or crackles. Heart: Regular rate and rhythm without murmurs, rubs, or gallops. Non-displaced PMI. Abd: Bowel sounds present. Soft, NT/ND without hepatosplenomegaly Ext: Bilateral compression stockings in place with minimal pretibial edema. Skin: Warm and dry without rash.  EKG: Normal sinus rhythm with marked first-degree AV block.  Lab Results  Component Value Date   WBC 5.9 04/24/2017   HGB 14.4 04/24/2017   HCT 42.7 04/24/2017   MCV 93 04/24/2017   PLT 216 04/24/2017    Lab Results  Component Value Date   NA 140 11/27/2018   K 5.1 11/27/2018   CL 101 11/27/2018   CO2 23 11/27/2018   BUN  30 (H) 11/27/2018   CREATININE 1.33 (H) 11/27/2018   GLUCOSE 191 (H) 11/27/2018   ALT 16 09/17/2017    Lab Results  Component Value Date   CHOL 147 06/28/2018   HDL 32 (L) 06/28/2018   LDLCALC 78 06/28/2018   TRIG 305 (H) 06/28/2018   CHOLHDL 4.6 06/28/2018    --------------------------------------------------------------------------------------------------  ASSESSMENT AND PLAN: Secondary hypertension with hyperaldosteronism: Blood pressure under better control with addition of spironolactone, though Evan Rivas still requires multiple agents including amlodipine, hydrochlorothiazide, losartan, and metoprolol succinate.  Blood pressure today is at upper limits of normal (goal less than 130/80), though home readings are frequently above this.  We will check a basic metabolic panel today  and plan to increase spironolactone to 75 mg daily if renal function and potassium allow.  In light of new first-degree AV block with increased dose of metoprolol, I will have Evan Rivas decrease his metoprolol to 50 mg daily.  Leg edema: Likely multifactorial and improved with current medication regimen.  I encouraged Evan Rivas to continue wearing compression stockings for likely element of venous insufficiency.  Follow-up: Return to clinic in 3 months.  Nelva Bush, MD 11/28/2018 7:52 AM

## 2018-11-27 ENCOUNTER — Other Ambulatory Visit: Payer: Self-pay

## 2018-11-27 ENCOUNTER — Ambulatory Visit: Payer: Medicare PPO | Admitting: Internal Medicine

## 2018-11-27 ENCOUNTER — Encounter: Payer: Self-pay | Admitting: Internal Medicine

## 2018-11-27 VITALS — BP 128/80 | HR 80 | Ht 74.0 in | Wt 275.0 lb

## 2018-11-27 DIAGNOSIS — R600 Localized edema: Secondary | ICD-10-CM | POA: Diagnosis not present

## 2018-11-27 DIAGNOSIS — I152 Hypertension secondary to endocrine disorders: Secondary | ICD-10-CM

## 2018-11-27 DIAGNOSIS — E269 Hyperaldosteronism, unspecified: Secondary | ICD-10-CM | POA: Diagnosis not present

## 2018-11-27 NOTE — Patient Instructions (Signed)
Medication Instructions:  Your physician has recommended you make the following change in your medication:  Once we get your lab results we will let you know for sure whether to increase Spironolactone to 75 mg by mouth once a day. At that time I will send in a new prescription.  If you need a refill on your cardiac medications before your next appointment, please call your pharmacy.   Lab work: Your physician recommends that you return for lab work in: [place] (BMET).  If you have labs (blood work) drawn today and your tests are completely normal, you will receive your results only by: [name] MyChart Message (if you have MyChart) OR . A paper copy in the mail If you have any lab test that is abnormal or we need to change your treatment, we will call you to review the results.  Testing/Procedures: - None ordered.   Follow-Up: At [facility], you and your health needs are our priority.  As part of our continuing mission to provide you with exceptional heart care, we have created designated Provider Care Teams.  These Care Teams include your primary Cardiologist (physician) and Advanced Practice Providers (APPs -  Physician Assistants and Nurse Practitioners) who all work together to provide you with the care you need, when you need it. You will need a follow up appointment in 3 months.  Please call our office 2 months in advance to schedule this appointment.  You may see DR Harrell Gave END or one of the following Advanced Practice Providers on your designated Care Team:   Murray Hodgkins, NP Christell Faith, PA-C . Marrianne Mood, PA-C

## 2018-11-28 ENCOUNTER — Encounter: Payer: Self-pay | Admitting: Internal Medicine

## 2018-11-28 ENCOUNTER — Telehealth: Payer: Self-pay | Admitting: *Deleted

## 2018-11-28 LAB — BASIC METABOLIC PANEL
BUN/Creatinine Ratio: 23 (ref 10–24)
BUN: 30 mg/dL — ABNORMAL HIGH (ref 8–27)
CO2: 23 mmol/L (ref 20–29)
Calcium: 9.5 mg/dL (ref 8.6–10.2)
Chloride: 101 mmol/L (ref 96–106)
Creatinine, Ser: 1.33 mg/dL — ABNORMAL HIGH (ref 0.76–1.27)
GFR calc Af Amer: 63 mL/min/{1.73_m2} (ref >59)
GFR calc non Af Amer: 55 mL/min/{1.73_m2} — ABNORMAL LOW (ref >59)
Glucose: 191 mg/dL — ABNORMAL HIGH (ref 65–99)
Potassium: 5.1 mmol/L (ref 3.5–5.2)
Sodium: 140 mmol/L (ref 134–144)

## 2018-11-28 MED ORDER — METOPROLOL SUCCINATE ER 50 MG PO TB24: 50.0000 mg | ORAL_TABLET | Freq: Every day | ORAL | 3 refills | Status: DC

## 2018-11-28 MED ORDER — SPIRONOLACTONE 25 MG PO TABS: 50.0000 mg | ORAL_TABLET | Freq: Every day | ORAL | 3 refills | Status: DC

## 2018-11-28 NOTE — Telephone Encounter (Signed)
Results called to pt. Pt verbalized understanding of results and plan of care. He is aware to continue taking spironolactone 50 mg daily and decrease metoprolol succinate to 50 mg daily. Rx's sent to pharmacy. Follow up appointment scheduled. Results released to My Chart.

## 2018-11-28 NOTE — Telephone Encounter (Signed)
-----   Message from Nelva Bush, MD sent at 11/28/2018  8:01 AM EDT ----- Please let Mr. Raspa know that his renal function is stable to slightly worse than baseline and his potassium is also high normal.  In light of these findings, I recommend that we not increase spironolactone as discussed yesterday but continue him on 50 mg daily.  I also noticed on his EKG after yesterday's visit that he has a new first-degree AV block, which could be due to metoprolol dose.  I recommend decreasing metoprolol succinate to 50 mg daily.  We should have him return in 2 weeks for a blood pressure check and EKG.  We will discuss further medication changes after that.

## 2018-12-16 NOTE — Progress Notes (Signed)
Cardiology Office Note    Date:  12/19/2018   ID:  Evan Rivas, DOB 01-26-1951, MRN [id number]  PCP:  Hubbard Hartshorn, FNP  Cardiologist:  Nelva Bush, MD  Electrophysiologist:  None   Chief Complaint: Follow-up  History of Present Illness:   Evan Rivas is a 68 y.o. male with history of diastolic dysfunction, DM2, hyperaldosteronism, hypertension, hyperlipidemia, cancer of the lip, anxiety, depression, gout, and arthritis who presents for follow-up of hypertension.  Patient was initially evaluated by Dr. [name] in 02/2018 for lower extremity edema. At that time, he reported bilateral foot and ankle swelling over the prior year that improved with leg elevation and compression stockings. He denied any medication changes or other possible changes that could have led to his symptoms and reported a stable weight. He noted frequent fatigue, particularly in the afternoon.  There was also some associated bendopnea. At that visit, his BP was noted to be 170/80 with a weight of 280 pounds. There was some concern he had dependent edema that was exacerbated by his high-dose amlodipine. Underlying, undiagnosed sleep apnea could not be excluded. His amlodipine was decreased to 5 mg daily and he was started on spironolactone 25 mg daily. He was encouraged to proceed with the sleep study as previously ordered by his PCP. Echo on 02/19/2018 showed an EF of 60-65%, mild LVH, Gr2DD, mild MR, mildly dilated left atrium, RV cavity size was normal with normal wall thickness and systolic function. Labs showed possible underlying hyperaldosteronism with an elevated aldosterone to renal activity ratio. He was started on spironolactone at his visit as above. He was referred to endocrinology who repeated labs off spironolactone which showed an aldosterone level of 9.9, renin suppressed at less than 0.167, and aldosterone renin ratio greater than 50 with associated hypokalemia.  CT of the adrenals in 06/2018  showed no acute abnormalities.  Renal vein sampling through [facility] demonstrated bilateral upper adrenal hyperplasia.  Patient was restarted on spironolactone.  With titration of spironolactone dose up to 50 mg daily he noted improvement in his blood pressure and lower extremity swelling.  He was most recently seen in our office on 11/27/2018 with BP ranging from the 130-140/80-90 range.  He continued to associate lower blood pressure readings when his glucose was elevated and continued to note intermittent leg swelling, particularly when he was not wearing his compression stockings on a regular basis.  He was noted to have a first-degree AV block on EKG at that visit leading to the decrease in his metoprolol to 50 mg daily.  Follow-up BMP demonstrated a serum creatinine 1.33 with a potassium of 5.1.  In this setting, his spironolactone was not titrated any further.  He was continued on metoprolol 50 mg daily, spironolactone 50 mg daily, amlodipine 5 mg daily, losartan 100 mg daily, and HCTZ 25 mg daily.  He comes in doing well today.  He denies any chest pain, shortness of breath, palpitations, dizziness, presyncope, or syncope.  He is compliant with compression stockings and with these in combination with bilateral has noted significant improvement in his lower extremity swelling.  Unfortunately, he has been without HCTZ since 12/02/2018 as his supply which is typically filled by [facility] has been stuck in [place], [place] since then.  He has been in contact with the [place] with arrival date uncertain.  Interestingly, [employer] did agree to send a 30-day supply of HCTZ to the patient yesterday, though this will be shipped the same way his prior  supply was.  He declines prescription for local pharmacy today.  Blood pressure continues to run in the Q000111Q to Q000111Q systolic.  Overall the patient is very pleased with his improvement.  He does not have any issues or concerns today.   Labs: 11/2018 - serum creatinine  1.33, potassium 5.1 06/2018 - A1c 8.1, total cholesterol 147, triglyceride 305, HDL 32, LDL 78 05/2018 - TSH normal  Past Medical History:  Diagnosis Date   Anxiety    panic attacks   Arthritis    Cancer ([place])    lip   Depression    Diabetes mellitus without complication (HCC)    type 2   GERD (gastroesophageal reflux disease)    Gout    History of kidney stones    Hyperaldosteronism ([place])    bilateral adrenal hyperplasia   Hyperlipidemia    Hypertension    Pneumonia    2014 and 2018 ( 2018-klebsiella pneumonia     Past Surgical History:  Procedure Laterality Date   ARTHROSCOPIC REPAIR ACL Left    CYSTOSCOPY W/ URETERAL STENT PLACEMENT Right 09/07/2014   Procedure: CYSTOSCOPY WITH STENT REPLACEMENT;  Surgeon: Hollice Espy, MD;  Location: [facility] OR[facility];  Service: Urology;  Laterality: Right;   CYSTOSCOPY/URETEROSCOPY/HOLMIUM LASER/STENT PLACEMENT Right 08/17/2014   Procedure: CYSTOSCOPY/URETEROSCOPY//STENT PLACEMENT/ attempt of lithotripsy;  Surgeon: Hollice Espy, MD;  Location: [facility];  Service: Urology;  Laterality: Right;   HERNIA REPAIR     MOHS SURGERY     lip   TOTAL KNEE ARTHROPLASTY Left 04/24/2016   Procedure: LEFT TOTAL KNEE ARTHROPLASTY;  Surgeon: Gaynelle Arabian, MD;  Location: [facility];  Service: Orthopedics;  Laterality: Left;  Adductor Block   TOTAL KNEE ARTHROPLASTY Right 10/23/2016   Procedure: RIGHT TOTAL KNEE ARTHROPLASTY;  Surgeon: Gaynelle Arabian, MD;  Location: [facility];  Service: Orthopedics;  Laterality: Right;   URETEROSCOPY WITH HOLMIUM LASER LITHOTRIPSY Right 09/07/2014   Procedure: URETEROSCOPY WITH HOLMIUM LASER LITHOTRIPSY;  Surgeon: Hollice Espy, MD;  Location: [facility] OR[facility];  Service: Urology;  Laterality: Right;   VASECTOMY      Current Medications: Current Meds  Medication Sig   allopurinol (ZYLOPRIM) 300 MG tablet Take 1 tablet (300 mg total) by mouth every evening.   ASPIRIN 81 PO Take 81 mg by mouth daily.    atorvastatin  (LIPITOR) 40 MG tablet Take 1 tablet (40 mg total) by mouth at bedtime.   Dulaglutide (TRULICITY) A999333 0000000 SOPN Inject 0.75 mLs into the skin once a week.   FLUoxetine (PROZAC) 40 MG capsule Take 1 capsule (40 mg total) by mouth daily.   fluticasone (FLONASE) 50 MCG/ACT nasal spray Place 2 sprays into both nostrils daily. 2 sprays   gabapentin (NEURONTIN) 300 MG capsule Take 1 capsule (300 mg total) by mouth 3 (three) times daily.   glipiZIDE (GLUCOTROL) 10 MG tablet Take 1 tablet (10 mg total) by mouth daily before breakfast.   glucose blood (ACCU-CHEK AVIVA PLUS) test strip Use as instructed   hydrochlorothiazide (HYDRODIURIL) 25 MG tablet Take 1 tablet (25 mg total) by mouth daily.   HYDROcodone-acetaminophen (NORCO) 10-325 MG tablet Take 1 tab BID; For 3rd daily dose: Take 1/2 tab Mon/Wed/Fri and take 1 tab Sun/Tues/Thurs/Sat   hydrOXYzine (ATARAX/VISTARIL) 10 MG tablet Take 1 tablet (10 mg total) by mouth every 8 (eight) hours as needed for anxiety.   losartan (COZAAR) 100 MG tablet Take 1 tablet (100 mg total) by mouth at bedtime.   metFORMIN (GLUCOPHAGE) 1000 MG tablet Take 1,000 mg by mouth  2 (two) times daily with a meal.   metoprolol succinate (TOPROL-XL) 50 MG 24 hr tablet Take 1 tablet (50 mg total) by mouth daily. Take with or immediately following a meal.   Naloxone HCl 0.4 MG/0.4ML SOAJ Injected once, then call 911.  As needed for opiate overdose.   spironolactone (ALDACTONE) 25 MG tablet Take 2 tablets (50 mg total) by mouth daily.   tamsulosin (FLOMAX) 0.4 MG CAPS capsule Take 1 capsule (0.4 mg total) by mouth daily after supper.   tiZANidine (ZANAFLEX) 4 MG tablet Take 1 tablet (4 mg total) by mouth at bedtime.    Allergies:   Celebrex  [celecoxib], Tetanus toxoids, and Tetanus-diphtheria toxoids td   Social History   Socioeconomic History   Marital status: Married    Spouse name: Hoyle Sauer   Number of children: 1   Years of education: Not on file     Highest education level: Not on file  Occupational History   Occupation: retired  Scientist, product/process development strain: Not hard at [employer]    Worry: Never true    Inability: Never true   Transportation needs    Medical: No    Non-medical: No  Tobacco Use   Smoking status: Never Smoker   Smokeless tobacco: Never Used  Substance and Sexual Activity   Alcohol use: No   Drug use: No   Sexual activity: Yes  Lifestyle   Physical activity    Days per week: 0 days    Minutes per session: 0 min   Stress: Only a little  Relationships   Social connections    Talks on phone: More than three times a week    Gets together: More than three times a week    Attends religious service: 1 to 4 times per year    Active member of club or organization: Yes    Attends meetings of clubs or organizations: 1 to 4 times per year    Relationship status: Married  Other Topics Concern   Not on file  Social History Narrative   Not on file     Family History:  The patient's family history includes Cancer in his mother; Depression in his sister; Heart disease in his father; Hyperlipidemia in his mother and sister; Hypertension in his mother and sister; Stroke in his father.  ROS:   Review of Systems  Constitutional: Negative for chills, diaphoresis, fever, malaise/fatigue and weight loss.  HENT: Negative for congestion.   Eyes: Negative for discharge and redness.  Respiratory: Negative for cough, hemoptysis, sputum production, shortness of breath and wheezing.   Cardiovascular: Negative for chest pain, palpitations, orthopnea, claudication, leg swelling and PND.  Gastrointestinal: Negative for abdominal pain, blood in stool, heartburn, melena, nausea and vomiting.  Genitourinary: Negative for hematuria.  Musculoskeletal: Negative for falls and myalgias.  Skin: Negative for rash.  Neurological: Negative for dizziness, tingling, tremors, sensory change, speech  change, focal weakness, loss of consciousness and weakness.  Endo/Heme/Allergies: Does not bruise/bleed easily.  Psychiatric/Behavioral: Negative for substance abuse. The patient is not nervous/anxious.   All other systems reviewed and are negative.    EKGs/Labs/Other Studies Reviewed:    Studies reviewed were summarized above. The additional studies were reviewed today:  2D echo 02/2018: - Left ventricle: The cavity size was normal. Wall thickness was   increased in a pattern of mild LVH. Systolic function was normal.   The estimated ejection fraction was in the range of 60% to 65%.  Wall motion was normal; there were no regional wall motion   abnormalities. Features are consistent with a pseudonormal left   ventricular filling pattern, with concomitant abnormal relaxation   and increased filling pressure (grade 2 diastolic dysfunction). - Mitral valve: There was mild regurgitation. - Left atrium: The atrium was mildly dilated. - Right ventricle: The cavity size was normal. Wall thickness was   normal. Systolic function was normal. __________  Renal artery ultrasound 03/2018: Summary: Largest Aortic Diameter: 2.0 cm   Renal:   Right: Normal size right kidney. Normal right Resisitive Index.        Abnormal cortical thickness of right kidney. No evidence of        right renal artery stenosis. RRV flow present. Left:  LRV flow present. No evidence of left renal artery stenosis.        Normal size of left kidney. Normal left Resistive Index.        Normal cortical thickness of the left kidney. Mesenteric: Normal Celiac artery and Superior Mesenteric artery findings.    EKG:  EKG is ordered today.  The EKG ordered today demonstrates NSR, 74 bpm, normal axis, improved PR interval measuring 198 ms at this time, poor R wave progression along the precordial leads, no acute ST-T changes  Recent Labs: 11/27/2018: BUN 30; Creatinine, Ser 1.33; Potassium 5.1; Sodium 140  Recent  Lipid Panel    Component Value Date/Time   CHOL 147 06/28/2018 1019   CHOL 140 09/17/2017 1014   TRIG 305 (H) 06/28/2018 1019   HDL 32 (L) 06/28/2018 1019   HDL 32 (L) 09/17/2017 1014   CHOLHDL 4.6 06/28/2018 1019   LDLCALC 78 06/28/2018 1019    PHYSICAL EXAM:    VS:  BP (!) 150/74 (BP Location: Left Arm, Patient Position: Sitting, Cuff Size: Large)    Pulse 74    Ht 6\' 3"  (1.905 m)    Wt 273 lb (123.8 kg)    BMI 34.12 kg/m   BMI: Body mass index is 34.12 kg/m.  Physical Exam  Constitutional: He is oriented to person, place, and time. He appears well-developed and well-nourished.  HENT:  Head: Normocephalic and atraumatic.  Eyes: Right eye exhibits no discharge. Left eye exhibits no discharge.  Neck: Normal range of motion. No JVD present.  Cardiovascular: Normal rate, regular rhythm, S1 normal, S2 normal and normal heart sounds. Exam reveals no distant heart sounds, no friction rub, no midsystolic click and no opening snap.  No murmur heard. Pulses:      Posterior tibial pulses are 2+ on the right side and 2+ on the left side.  Pulmonary/Chest: Effort normal and breath sounds normal. No respiratory distress. He has no decreased breath sounds. He has no wheezes. He has no rales. He exhibits no tenderness.  Abdominal: Soft. He exhibits no distension. There is no abdominal tenderness.  Musculoskeletal:        General: No edema.     Comments: Bilateral compression stockings noted  Neurological: He is alert and oriented to person, place, and time.  Skin: Skin is warm and dry. No cyanosis. Nails show no clubbing.  Psychiatric: He has a normal mood and affect. His speech is normal and behavior is normal. Judgment and thought content normal.    Wt Readings from Last 3 Encounters:  12/19/18 273 lb (123.8 kg)  11/27/18 275 lb (124.7 kg)  09/24/18 271 lb 6.4 oz (123.1 kg)     ASSESSMENT & PLAN:   1. Secondary hypertension with  hyperaldosteronism: Blood pressure continues to run on  the higher side with most readings in the Q000111Q to Q000111Q systolic.  Of note, the patient has been without HCTZ since 12/02/2018 as outlined above.  He does have a 30-day supply in route which was shipped yesterday.  However, this is being shifted the same way is 90-day supply was and is currently held up in [place], [place].  He did decline local pharmacy prescription of HCTZ at this visit today and will let us know if he would like [place] to send this end at a later date.  We did not make any adjustments to his antihypertensive regimen at this time pending BMP which was drawn today in the setting of him being off HCTZ.  Once he restarts HCTZ, if blood pressure continues to run greater than [id number] systolic, further escalation of antihypertensive therapy can be undertaken at that time.  For now, he will continue current doses of amlodipine, HCTZ (when he gets it), losartan, Toprol-XL, and spironolactone.  2. Diastolic dysfunction/leg edema: He appears euvolemic and well compensated.  Lower extremity swelling has resolved with addition of spironolactone and compression stockings.  Weight remains stable.  Continue optimal blood pressure and heart rate control.  3. First-degree AV block: Resolved following decreasing of Toprol-XL.  Asymptomatic.   Disposition: F/u with Dr. [name] or an APP in 3 months.   Medication Adjustments/Labs and Tests Ordered: Current medicines are reviewed at length with the patient today.  Concerns regarding medicines are outlined above. Medication changes, Labs and Tests ordered today are summarized above and listed in the Patient Instructions accessible in Encounters.   Signed, Christell Faith, PA-C 12/19/2018 11:01 AM     [facility] [address] [place] [address] [place],  [address] [phone]

## 2018-12-19 ENCOUNTER — Other Ambulatory Visit: Payer: Self-pay

## 2018-12-19 ENCOUNTER — Other Ambulatory Visit: Payer: Self-pay | Admitting: Internal Medicine

## 2018-12-19 ENCOUNTER — Ambulatory Visit (INDEPENDENT_AMBULATORY_CARE_PROVIDER_SITE_OTHER): Payer: Medicare PPO | Admitting: Physician Assistant

## 2018-12-19 ENCOUNTER — Encounter: Payer: Self-pay | Admitting: Physician Assistant

## 2018-12-19 VITALS — BP 150/74 | HR 74 | Ht 75.0 in | Wt 273.0 lb

## 2018-12-19 DIAGNOSIS — R600 Localized edema: Secondary | ICD-10-CM

## 2018-12-19 DIAGNOSIS — I152 Hypertension secondary to endocrine disorders: Secondary | ICD-10-CM

## 2018-12-19 DIAGNOSIS — E269 Hyperaldosteronism, unspecified: Secondary | ICD-10-CM

## 2018-12-19 DIAGNOSIS — I10 Essential (primary) hypertension: Secondary | ICD-10-CM | POA: Diagnosis not present

## 2018-12-19 DIAGNOSIS — I5189 Other ill-defined heart diseases: Secondary | ICD-10-CM | POA: Diagnosis not present

## 2018-12-19 MED ORDER — HYDROCHLOROTHIAZIDE 25 MG PO TABS: 25.0000 mg | ORAL_TABLET | Freq: Every day | ORAL | 0 refills | Status: DC

## 2018-12-19 NOTE — Telephone Encounter (Signed)
Requested Prescriptions   Signed Prescriptions Disp Refills  . hydrochlorothiazide (HYDRODIURIL) 25 MG tablet 90 tablet 0    Sig: Take 1 tablet (25 mg total) by mouth daily.    Authorizing Provider: Rise Mu    Ordering User: Raelene Bott, Shaneta Cervenka L

## 2018-12-19 NOTE — Telephone Encounter (Signed)
hydrochlorothiazide (HYDRODIURIL) 25 MG tablet 90 tablet 0 12/19/2018    Sig - Route: Take 1 tablet (25 mg total) by mouth daily. - Oral   Sent to pharmacy as: hydrochlorothiazide (HYDRODIURIL) 25 MG tablet   E-Prescribing Status: Sent to pharmacy (12/19/2018 1:45 PM EDT)   Associated Diagnoses  Essential hypertension     Bilateral lower extremity edema     [place], [place]

## 2018-12-19 NOTE — Patient Instructions (Signed)
Medication Instructions:  Your physician recommends that you continue on your current medications as directed. Please refer to the Current Medication list given to you today.  If you need a refill on your cardiac medications before your next appointment, please call your pharmacy.   Lab work: Art gallery manager today If you have labs (blood work) drawn today and your tests are completely normal, you will receive your results only by: [name] MyChart Message (if you have MyChart) OR . A paper copy in the mail If you have any lab test that is abnormal or we need to change your treatment, we will call you to review the results.  Testing/Procedures: None ordered  Follow-Up: At [facility], you and your health needs are our priority.  As part of our continuing mission to provide you with exceptional heart care, we have created designated Provider Care Teams.  These Care Teams include your primary Cardiologist (physician) and Advanced Practice Providers (APPs -  Physician Assistants and Nurse Practitioners) who all work together to provide you with the care you need, when you need it. You will need a follow up appointment in 3 months.  You may see Nelva Bush, MD or one of the following Advanced Practice Providers on your designated Care Team:   Murray Hodgkins, NP Christell Faith, PA-C . Marrianne Mood, PA-C  Any Other Special Instructions Will Be Listed Below (If Applicable). N/A

## 2018-12-19 NOTE — Telephone Encounter (Signed)
°*  STAT* If patient is at the pharmacy, call can be transferred to refill team.   1. Which medications need to be refilled? (please list name of each medication and dose if known)    HCTZ 25 mg po q d   2. Which pharmacy/location (including street and city if local pharmacy) is medication to be sent to?   [facility] [address]   3. Do they need a 30 day or 90 day supply?  [place]

## 2018-12-20 ENCOUNTER — Telehealth: Payer: Self-pay | Admitting: Physician Assistant

## 2018-12-20 DIAGNOSIS — I10 Essential (primary) hypertension: Secondary | ICD-10-CM

## 2018-12-20 DIAGNOSIS — Z79899 Other long term (current) drug therapy: Secondary | ICD-10-CM

## 2018-12-20 DIAGNOSIS — I5189 Other ill-defined heart diseases: Secondary | ICD-10-CM

## 2018-12-20 LAB — BASIC METABOLIC PANEL
BUN/Creatinine Ratio: 21 (ref 10–24)
BUN: 24 mg/dL (ref 8–27)
CO2: 22 mmol/L (ref 20–29)
Calcium: 9.2 mg/dL (ref 8.6–10.2)
Chloride: 104 mmol/L (ref 96–106)
Creatinine, Ser: 1.17 mg/dL (ref 0.76–1.27)
GFR calc Af Amer: 74 mL/min/{1.73_m2} (ref >59)
GFR calc non Af Amer: 64 mL/min/{1.73_m2} (ref >59)
Glucose: 189 mg/dL — ABNORMAL HIGH (ref 65–99)
Potassium: 4.3 mmol/L (ref 3.5–5.2)
Sodium: 141 mmol/L (ref 134–144)

## 2018-12-20 MED ORDER — SPIRONOLACTONE 25 MG PO TABS: ORAL_TABLET | ORAL | 3 refills | Status: DC

## 2018-12-20 NOTE — Telephone Encounter (Signed)
Notes recorded by Rise Mu, PA-C on 12/20/2018 at 7:13 AM EDT  Renal function improved and back to his baseline.  Potassium improved and remains at goal.  Random glucose elevated with known diabetes.  Please increase spironolactone to 75 mg daily.  He will need a follow-up BMP 1 week after increasing spironolactone to assess for stable renal function and potassium.

## 2018-12-20 NOTE — Telephone Encounter (Signed)
I spoke with the patient regarding his lab results and Christell Faith, PA's recommendations to: 1) Increase spironolactone to 75 mg once daily 2) Repeat a BMP in 1 week  The patient is agreeable with the above recommendations and voices understanding. He will increase spironolactone 25 mg- take 3 tablets (75 mg) by mouth once daily. He will also go to the [facility] in 1 week for a BMP.

## 2018-12-25 ENCOUNTER — Other Ambulatory Visit
Admission: RE | Admit: 2018-12-25 | Discharge: 2018-12-25 | Disposition: A | Discharge status: Home / Self Care | Payer: Medicare PPO | Source: Ambulatory Visit | Attending: Physician Assistant | Admitting: Physician Assistant

## 2018-12-25 DIAGNOSIS — I10 Essential (primary) hypertension: Secondary | ICD-10-CM | POA: Insufficient documentation

## 2018-12-25 DIAGNOSIS — I5189 Other ill-defined heart diseases: Secondary | ICD-10-CM | POA: Insufficient documentation

## 2018-12-25 DIAGNOSIS — Z79899 Other long term (current) drug therapy: Secondary | ICD-10-CM | POA: Diagnosis present

## 2018-12-25 LAB — BASIC METABOLIC PANEL
Anion gap: 8 (ref 5–15)
BUN: 35 mg/dL — ABNORMAL HIGH (ref 8–23)
CO2: 27 mmol/L (ref 22–32)
Calcium: 9.5 mg/dL (ref 8.9–10.3)
Chloride: 104 mmol/L (ref 98–111)
Creatinine, Ser: 1.49 mg/dL — ABNORMAL HIGH (ref 0.61–1.24)
GFR calc Af Amer: 55 mL/min — ABNORMAL LOW (ref >60)
GFR calc non Af Amer: 48 mL/min — ABNORMAL LOW (ref >60)
Glucose, Bld: 180 mg/dL — ABNORMAL HIGH (ref 70–99)
Potassium: 4.9 mmol/L (ref 3.5–5.1)
Sodium: 139 mmol/L (ref 135–145)

## 2018-12-27 ENCOUNTER — Telehealth: Payer: Self-pay | Admitting: *Deleted

## 2018-12-27 DIAGNOSIS — I5189 Other ill-defined heart diseases: Secondary | ICD-10-CM

## 2018-12-27 DIAGNOSIS — Z79899 Other long term (current) drug therapy: Secondary | ICD-10-CM

## 2018-12-27 MED ORDER — SPIRONOLACTONE 25 MG PO TABS: ORAL_TABLET | ORAL | 3 refills | Status: DC

## 2018-12-27 NOTE — Telephone Encounter (Signed)
-----   Message from Rise Mu, PA-C sent at 12/26/2018  6:34 PM EDT ----- Slight worsening of renal function and high-normal potassium on higher dose spironolactone. Please go back down on spironolactone to 50 mg daily. Increase water intake. Recheck BMP in 1 week.

## 2018-12-27 NOTE — Telephone Encounter (Signed)
Results called to pt. Pt verbalized understanding of results, to decrease spironolactone to 50 mg daily and get BMP at [facility] in 1 week.  Med list updated.

## 2019-01-01 ENCOUNTER — Encounter: Payer: Self-pay | Admitting: Family Medicine

## 2019-01-01 ENCOUNTER — Ambulatory Visit (INDEPENDENT_AMBULATORY_CARE_PROVIDER_SITE_OTHER): Payer: Medicare PPO | Admitting: Family Medicine

## 2019-01-01 ENCOUNTER — Other Ambulatory Visit: Payer: Self-pay

## 2019-01-01 VITALS — BP 146/74 | HR 77

## 2019-01-01 DIAGNOSIS — I10 Essential (primary) hypertension: Secondary | ICD-10-CM

## 2019-01-01 DIAGNOSIS — Z79899 Other long term (current) drug therapy: Secondary | ICD-10-CM

## 2019-01-01 DIAGNOSIS — G4733 Obstructive sleep apnea (adult) (pediatric): Secondary | ICD-10-CM | POA: Diagnosis not present

## 2019-01-01 DIAGNOSIS — F331 Major depressive disorder, recurrent, moderate: Secondary | ICD-10-CM

## 2019-01-01 DIAGNOSIS — I700 Atherosclerosis of aorta: Secondary | ICD-10-CM

## 2019-01-01 DIAGNOSIS — F419 Anxiety disorder, unspecified: Secondary | ICD-10-CM

## 2019-01-01 DIAGNOSIS — M5136 Other intervertebral disc degeneration, lumbar region: Secondary | ICD-10-CM

## 2019-01-01 DIAGNOSIS — F1120 Opioid dependence, uncomplicated: Secondary | ICD-10-CM | POA: Diagnosis not present

## 2019-01-01 DIAGNOSIS — M1A4720 Other secondary chronic gout, left ankle and foot, without tophus (tophi): Secondary | ICD-10-CM

## 2019-01-01 DIAGNOSIS — E119 Type 2 diabetes mellitus without complications: Secondary | ICD-10-CM

## 2019-01-01 DIAGNOSIS — N400 Enlarged prostate without lower urinary tract symptoms: Secondary | ICD-10-CM

## 2019-01-01 DIAGNOSIS — E7800 Pure hypercholesterolemia, unspecified: Secondary | ICD-10-CM

## 2019-01-01 MED ORDER — TRULICITY 0.75 MG/0.5ML ~~LOC~~ SOAJ: 0.7500 mL | SUBCUTANEOUS | 3 refills | Status: DC

## 2019-01-01 MED ORDER — TAMSULOSIN HCL 0.4 MG PO CAPS: 0.4000 mg | ORAL_CAPSULE | Freq: Every day | ORAL | 3 refills | Status: DC

## 2019-01-01 MED ORDER — GABAPENTIN 300 MG PO CAPS: 300.0000 mg | ORAL_CAPSULE | Freq: Three times a day (TID) | ORAL | 3 refills | Status: DC

## 2019-01-01 MED ORDER — LOSARTAN POTASSIUM 100 MG PO TABS: 100.0000 mg | ORAL_TABLET | Freq: Every day | ORAL | 3 refills | Status: DC

## 2019-01-01 MED ORDER — GLIPIZIDE 10 MG PO TABS: 10.0000 mg | ORAL_TABLET | Freq: Every day | ORAL | 3 refills | Status: DC

## 2019-01-01 MED ORDER — GABAPENTIN 300 MG PO CAPS: 300.0000 mg | ORAL_CAPSULE | Freq: Three times a day (TID) | ORAL | 0 refills | Status: DC

## 2019-01-01 MED ORDER — BACLOFEN 10 MG PO TABS: 10.0000 mg | ORAL_TABLET | Freq: Three times a day (TID) | ORAL | 1 refills | Status: DC | PRN

## 2019-01-01 MED ORDER — HYDROCODONE-ACETAMINOPHEN 10-325 MG PO TABS: ORAL_TABLET | ORAL | 0 refills | Status: AC

## 2019-01-01 MED ORDER — ATORVASTATIN CALCIUM 40 MG PO TABS: 40.0000 mg | ORAL_TABLET | Freq: Every day | ORAL | 3 refills | Status: DC

## 2019-01-01 MED ORDER — FLUOXETINE HCL 40 MG PO CAPS: 40.0000 mg | ORAL_CAPSULE | Freq: Every day | ORAL | 3 refills | Status: DC

## 2019-01-01 NOTE — Progress Notes (Signed)
Name: Evan Rivas   MRN: [id number]    DOB: Apr 09, 1950   Date:01/01/2019       Progress Note  Subjective  Chief Complaint  Chief Complaint  Patient presents with   Follow-up   Medication Refill    I connected with  Evan Rivas  on 01/01/19 at 11:20 AM EDT by a video enabled telemedicine application and verified that I am speaking with the correct person using two identifiers.  I discussed the limitations of evaluation and management by telemedicine and the availability of in person appointments. The patient expressed understanding and agreed to proceed. Staff also discussed with the patient that there may be a patient responsible charge related to this service. Patient Location: Home Provider Location: Home Additional Individuals present: None  HPI  Chronic Pain Management: - DDD/chronic back pain:He notes an increase in his back pain recently - caring for his granddaughter, change of weather.  He did return to the gym for 4 days, but has been busy and not able to maintain a routine. There is no radiation into the legs, no weakness in legs. He has done PT for his back in the past and it seemed to help once he was able to tolerate the exercises. He has never had any spinal surgeries; Dr. [name] (pain management) did not do any procedures with Evan Rivas as Evan Rivas declined. - Tizanidine is not helping as much with his current flare - we will switch to baclofen.  He did run out of gabapentin for 2 days. Advised to restart at BID dosing for 5-7 days, then go back up to the TID dosing.  - He is aware that my Rx of his pain medication is temporary to help with his significant financial burden. We will re-evaluate our pain contract and need for my assistance at the end of the 2020 Calendar year. He notes today that his wife is still waiting on disability determination, and has not had a paycheck since August 2019 and states at this point he cannot afford to return to Dr.  [name] at this time. - Discussed Narcan auto-injector - has at home and knows how to use it. - He decreased to taking 1/2 tablet norco for 1 of his daily doses three times a week.  He is otherwise taking 1 tablet of 10mg -325mg  Norco TID.  He has noticed a bit of a spike in his pain on days where he takes the 1/2 tablet, so we will not go down any more at this time. Controlled Substance Database is checked and no suspicious findings. - Plan for in person to do UDS at next visit in December 2020.  Hyperaldosteronism:He saw endocrinology last in July; per [name] notes, it is not thought to be cancer related, but more likely related to adrenal adenoma; however not a surgical candidate at this time.  Seeing Cardiology for HTN management - taking spironolactone.  No longer planning to see [name].  DM:Was referred to endocrinology for DM, but was sent back to me as hislast A1C was at goal range. He is taking trulicity, metformin 2000mg  XR BID, and still taking glipizide 10mg  once daily without issues. Denies polydipsia, polyphagia.  Occasional polyuria.  CKD Stage 3: He had decreased GFR with Cardiology a week ago.  He is concerned about his kidney function, and would like to stop any medications that could be nephrotoxic.  I have advised D/c-ing nabumetone in the past, and he is now willing to stop completely.  He is also willing to trial coming off of his allopurinol. Will have recheck in 3 days via Cardiology, and we will maintain routine monitoring as well.   HTN:135/89, 135/84, 136/84 at home the last 3 days. Managed by Cardiology now - titrating spironolactone. -doestake medications as prescribed,denies any missed doses. Is followed by cardiology for management. -taking medications as instructed, no medication side effects noted, no TIAs, no chest pain on exertion, no dyspnea on exertion.- DASH diet discussed - ptdoes notfollow a low sodium diet;eats diet heavy in carbs,  processed foods but does report trying to bake most meats. - The followingarecontributing factors: sedentary lifestyle, saturated fats in diet, high sodium diet  Obesity: Weight Management History:difficulty managing weight even when he follows low carb diet and exercises due to COVID-19 pandemic.  Diet:heavy in carbs and processed foods, sugars. States he sometimes overeats but tries to manage portions as much as possible. Exercise:not activecurrently Co-Morbid Conditions:diabetes mellitus, dyslipidemias and hypertension; 2 or more of these conditions combined with BMI >30 is considered morbid obesity; is this diagnosis appropriate and/or added to patient's problem list?Yes Unchanged, stable.   BLE Edema/Diastolic Dysfunction/HLD:Seeing Dr. [name] and [name] for management now -has been off of sprionolactonethat was prescribed by themr/t procedure he is scheduled to have.  He noticed increase in swelling when she stopped the spironolactone, but states it is not severe.  No chest pain or shortness of breath. Compression stockings are worn 6 days a week foratleast10 hours at a time.  Leaves them off one day/week to "give legs a break". No chest pain or shortness of breath. Taking statin therapy as prescribed.  Saw Cardiology recently and is maintaining current regimen without issues.   Depression and Anxiety:Currently onprozac 40mg  and doing well. States anxiety is normally well controlled,but reports an increase in itrecently due to his wife being out of work and increasing medical bills. Has hydroxyzine prn for anxiety. Is takingitabout every other day which is working well for him.We discussed possible sedative effects of his medications, and he verbalizes understanding.  Stable today.   Office Visit from 01/01/2019 in [facility]  PHQ-9 Total Score  2     Daytime Sleepiness/OSA:still dealing with daytime drowsiness which has not really changed  much.Using cpap nightly now for sleep apnea for about 5.5 hours a night - started on 04/25/18. -Using nasal mask instead of face mask, feels like he wakes up often due to being a mouth breather and being concerned he is not breathing through his nose.   -Endocrinology performingfurthertesting to determine if there is an underlying cause related to adrenal glands. - Seeing Dr. Ashby Dawes for follow up on OSA.Discussed armodafonil with the patient, but unfortunately, it is cost prohibitive at this time.  Needs follow up with Dr. Ashby Dawes.  [id number] to have no issues with nocturia.Doing well on flomax, does have some increased urinary frequency after taking his flomax.Will switch flomax to lunch time to see if this helps with his urinary frequency.  Gout: D/C allopurinol due to recent kidney function decrease and no recent gout flares. Unchanged.   Patient Active Problem List   Diagnosis Date Noted   Hyperaldosteronism ([place]) 0000000   Diastolic dysfunction 0000000   OSA (obstructive sleep apnea) 06/24/2018   Excessive daytime sleepiness 06/24/2018   Aortic atherosclerosis ([place]) 06/17/2018   Leg edema 12/18/2017   BPH without urinary obstruction 06/03/2015   Depression 03/05/2015   Hyperlipidemia 09/03/2014   Major depressive disorder, recurrent episode, moderate ([place]) 09/03/2014  Chronic low back pain 09/03/2014   Arthralgia of both knees 09/03/2014   Type 2 diabetes mellitus without complication ([place]) [id number]   Allergic rhinitis 07/04/2014   Anxiety 07/04/2014   Continuous opioid dependence ([place]) 07/04/2014   Diabetes mellitus type 2 in obese ([place]) 07/04/2014   DDD (degenerative disc disease), lumbar 07/04/2014   Gout 07/04/2014   Essential hypertension 07/04/2014   Flu vaccine need 07/04/2014   Morbid obesity ([place]) 07/04/2014   OA (osteoarthritis) of knee 07/04/2014   Neuralgia neuritis, sciatic nerve 07/04/2014   Arthralgia  of multiple joints 07/04/2014   H/O malignant neoplasm of skin 12/24/2013    Past Surgical History:  Procedure Laterality Date   ARTHROSCOPIC REPAIR ACL Left    CYSTOSCOPY W/ URETERAL STENT PLACEMENT Right 09/07/2014   Procedure: CYSTOSCOPY WITH STENT REPLACEMENT;  Surgeon: Hollice Espy, MD;  Location: [facility];  Service: Urology;  Laterality: Right;   CYSTOSCOPY/URETEROSCOPY/HOLMIUM LASER/STENT PLACEMENT Right 08/17/2014   Procedure: CYSTOSCOPY/URETEROSCOPY//STENT PLACEMENT/ attempt of lithotripsy;  Surgeon: Hollice Espy, MD;  Location: [facility];  Service: Urology;  Laterality: Right;   HERNIA REPAIR     MOHS SURGERY     lip   TOTAL KNEE ARTHROPLASTY Left 04/24/2016   Procedure: LEFT TOTAL KNEE ARTHROPLASTY;  Surgeon: Gaynelle Arabian, MD;  Location: [facility];  Service: Orthopedics;  Laterality: Left;  Adductor Block   TOTAL KNEE ARTHROPLASTY Right 10/23/2016   Procedure: RIGHT TOTAL KNEE ARTHROPLASTY;  Surgeon: Gaynelle Arabian, MD;  Location: [facility];  Service: Orthopedics;  Laterality: Right;   URETEROSCOPY WITH HOLMIUM LASER LITHOTRIPSY Right 09/07/2014   Procedure: URETEROSCOPY WITH HOLMIUM LASER LITHOTRIPSY;  Surgeon: Hollice Espy, MD;  Location: [facility];  Service: Urology;  Laterality: Right;   VASECTOMY      Family History  Problem Relation Age of Onset   Hypertension Mother    Cancer Mother    Hyperlipidemia Mother    Stroke Father    Heart disease Father    Depression Sister    Hypertension Sister    Hyperlipidemia Sister     Social History   Socioeconomic History   Marital status: Married    Spouse name: Hoyle Sauer   Number of children: 1   Years of education: Not on file   Highest education level: Not on file  Occupational History   Occupation: retired  Scientist, product/process development strain: Not hard at [employer]    Worry: Never true    Inability: Never true   Transportation needs    Medical: No    Non-medical: No    Tobacco Use   Smoking status: Never Smoker   Smokeless tobacco: Never Used  Substance and Sexual Activity   Alcohol use: No   Drug use: No   Sexual activity: Yes  Lifestyle   Physical activity    Days per week: 0 days    Minutes per session: 0 min   Stress: Only a little  Relationships   Social connections    Talks on phone: More than three times a week    Gets together: More than three times a week    Attends religious service: 1 to 4 times per year    Active member of club or organization: Yes    Attends meetings of clubs or organizations: 1 to 4 times per year    Relationship status: Married   Intimate partner violence    Fear of current or ex partner: No    Emotionally abused: No  Physically abused: No    Forced sexual activity: No  Other Topics Concern   Not on file  Social History Narrative   Not on file     Current Outpatient Medications:    allopurinol (ZYLOPRIM) 300 MG tablet, Take 1 tablet (300 mg total) by mouth every evening., Disp: 90 tablet, Rfl: 1   ASPIRIN 81 PO, Take 81 mg by mouth daily. , Disp: , Rfl:    atorvastatin (LIPITOR) 40 MG tablet, Take 1 tablet (40 mg total) by mouth at bedtime., Disp: 90 tablet, Rfl: 1   Dulaglutide (TRULICITY) A999333 0000000 SOPN, Inject 0.75 mLs into the skin once a week., Disp: 2 mL, Rfl: 3   FLUoxetine (PROZAC) 40 MG capsule, Take 1 capsule (40 mg total) by mouth daily., Disp: 90 capsule, Rfl: 1   fluticasone (FLONASE) 50 MCG/ACT nasal spray, Place 2 sprays into both nostrils daily. 2 sprays, Disp: 16 g, Rfl: 6   glipiZIDE (GLUCOTROL) 10 MG tablet, Take 1 tablet (10 mg total) by mouth daily before breakfast., Disp: 90 tablet, Rfl: 1   hydrochlorothiazide (HYDRODIURIL) 25 MG tablet, Take 1 tablet (25 mg total) by mouth daily., Disp: 90 tablet, Rfl: 0   HYDROcodone-acetaminophen (NORCO) 10-325 MG tablet, Take 1 tab BID; For 3rd daily dose: Take 1/2 tab Mon/Wed/Fri and take 1 tab Sun/Tues/Thurs/Sat, Disp:  87 tablet, Rfl: 0   hydrOXYzine (ATARAX/VISTARIL) 10 MG tablet, Take 1 tablet (10 mg total) by mouth every 8 (eight) hours as needed for anxiety., Disp: 30 tablet, Rfl: 1   losartan (COZAAR) 100 MG tablet, Take 1 tablet (100 mg total) by mouth at bedtime., Disp: 90 tablet, Rfl: 1   metFORMIN (GLUCOPHAGE) 1000 MG tablet, Take 1,000 mg by mouth 2 (two) times daily with a meal., Disp: , Rfl:    metoprolol succinate (TOPROL-XL) 50 MG 24 hr tablet, Take 1 tablet (50 mg total) by mouth daily. Take with or immediately following a meal., Disp: 90 tablet, Rfl: 3   Naloxone HCl 0.4 MG/0.4ML SOAJ, Injected once, then call 911.  As needed for opiate overdose., Disp: 1 Package, Rfl: 1   spironolactone (ALDACTONE) 25 MG tablet, Take 2 tablets (50 mg) by mouth once daily, Disp: 270 tablet, Rfl: 3   tamsulosin (FLOMAX) 0.4 MG CAPS capsule, Take 1 capsule (0.4 mg total) by mouth daily after supper., Disp: 90 capsule, Rfl: 1   tiZANidine (ZANAFLEX) 4 MG tablet, Take 1 tablet (4 mg total) by mouth at bedtime., Disp: 90 tablet, Rfl: 1   amLODipine (NORVASC) 5 MG tablet, Take 1 tablet (5 mg total) by mouth daily., Disp: 90 tablet, Rfl: 3   gabapentin (NEURONTIN) 300 MG capsule, Take 1 capsule (300 mg total) by mouth 3 (three) times daily., Disp: 270 capsule, Rfl: 0   glucose blood (ACCU-CHEK AVIVA PLUS) test strip, Use as instructed, Disp: 100 each, Rfl: 12  Allergies  Allergen Reactions   Celebrex  [Celecoxib]     GI   Tetanus Toxoids Other (See Comments)    unkown   Tetanus-Diphtheria Toxoids Td     Other reaction(s): Unknown    I personally reviewed active problem list, medication list, allergies, notes from last encounter, lab results with the patient/caregiver today.   ROS  Ten systems reviewed and is negative except as mentioned in HPI  Objective  Virtual encounter, vitals not obtained.  There is no height or weight on file to calculate BMI.  Physical Exam  Constitutional:  Patient appears well-developed and well-nourished. No distress.  HENT:  Head: Normocephalic and atraumatic.  Neck: Normal range of motion. Pulmonary/Chest: Effort normal. No respiratory distress. Speaking in complete sentences Neurological: Pt is alert and oriented to person, place, and time. Coordination, speech and gait are normal.  Psychiatric: Patient has a normal mood and affect. behavior is normal. Judgment and thought content normal.  No results found for this or any previous visit (from the past 72 hour(s)).  PHQ2/9: Depression screen [facility] 2/9 01/01/2019 09/24/2018 09/24/2018 08/21/2018 07/30/2018  Decreased Interest 0 0 0 0 0  Down, Depressed, Hopeless 1 1 1 1  0  PHQ - 2 Score 1 1 1 1  0  Altered sleeping 0 0 0 0 1  Tired, decreased energy 1 0 0 0 0  Change in appetite 0 0 0 0 0  Feeling bad or failure about yourself  0 0 0 0 0  Trouble concentrating 0 0 0 0 0  Moving slowly or fidgety/restless 0 0 0 0 0  Suicidal thoughts 0 0 0 0 0  PHQ-9 Score 2 1 1 1 1   Difficult doing work/chores Somewhat difficult - Not difficult at all Not difficult at all Not difficult at all  Some recent data might be hidden   PHQ-2/9 Result is negative.    Fall Risk: Fall Risk  01/01/2019 09/24/2018 09/24/2018 08/21/2018 07/30/2018  Falls in the past year? 0 1 0 0 1  Number falls in past yr: 0 1 0 0 1  Injury with Fall? 0 1 0 0 1  Risk for fall due to : - History of fall(s) - - -  Follow up - Falls prevention discussed - - Falls evaluation completed   Assessment & Plan 1. DDD (degenerative disc disease), lumbar - Still having flares of his pain, we will not increase norco, but will also not proceed with decreasing his weekly dose at this point.  Daily MME total is 29-30.  He is very compliant, no suspicious findings on controlled substance database.  We will trial an alternative muscle relaxer - baclofen to try to gain better control of his pain.  He is again made aware of the sedative effects of these  medications, the safety risks involved, and verbalizes understanding.  Has Narcan at home and he and his wife know how to use. - HYDROcodone-acetaminophen (NORCO) 10-325 MG tablet; Take 1 tab BID; For 3rd daily dose: Take 1/2 tab Mon/Wed/Fri and take 1 tab Sun/Tues/Thurs/Sat  Dispense: 87 tablet; Refill: 0 - HYDROcodone-acetaminophen (NORCO) 10-325 MG tablet; Take 1 tab BID; For 3rd daily dose: Take 1/2 tab Mon/Wed/Fri and take 1 tab Sun/Tues/Thurs/Sat  Dispense: 87 tablet; Refill: 0 - HYDROcodone-acetaminophen (NORCO) 10-325 MG tablet; Take 1 tab BID; For 3rd daily dose: Take 1/2 tab Mon/Wed/Fri and take 1 tab Sun/Tues/Thurs/Sat  Dispense: 87 tablet; Refill: 0 - baclofen (LIORESAL) 10 MG tablet; Take 1 tablet (10 mg total) by mouth 3 (three) times daily as needed for muscle spasms.  Dispense: 90 each; Refill: 1 - gabapentin (NEURONTIN) 300 MG capsule; Take 1 capsule (300 mg total) by mouth 3 (three) times daily. Emergency Supply while mail order comes in.  Dispense: 270 capsule; Refill: 3  2. Continuous opioid dependence (HCC) - HYDROcodone-acetaminophen (NORCO) 10-325 MG tablet; Take 1 tab BID; For 3rd daily dose: Take 1/2 tab Mon/Wed/Fri and take 1 tab Sun/Tues/Thurs/Sat  Dispense: 87 tablet; Refill: 0 - HYDROcodone-acetaminophen (NORCO) 10-325 MG tablet; Take 1 tab BID; For 3rd daily dose: Take 1/2 tab Mon/Wed/Fri and take 1 tab Sun/Tues/Thurs/Sat  Dispense: 87 tablet; Refill:  0 - HYDROcodone-acetaminophen (NORCO) 10-325 MG tablet; Take 1 tab BID; For 3rd daily dose: Take 1/2 tab Mon/Wed/Fri and take 1 tab Sun/Tues/Thurs/Sat  Dispense: 87 tablet; Refill: 0 - baclofen (LIORESAL) 10 MG tablet; Take 1 tablet (10 mg total) by mouth 3 (three) times daily as needed for muscle spasms.  Dispense: 90 each; Refill: 1 - gabapentin (NEURONTIN) 300 MG capsule; Take 1 capsule (300 mg total) by mouth 3 (three) times daily. Emergency Supply while mail order comes in.  Dispense: 270 capsule; Refill: 3  3.  Controlled substance agreement signed - will need to re-evaluate agreement at the end of the calendar year; also due for UDS at next visit. - HYDROcodone-acetaminophen ([place]) 10-325 MG tablet; Take 1 tab BID; For 3rd daily dose: Take 1/2 tab Mon/Wed/Fri and take 1 tab Sun/Tues/Thurs/Sat  Dispense: 87 tablet; Refill: 0 - HYDROcodone-acetaminophen (NORCO) 10-325 MG tablet; Take 1 tab BID; For 3rd daily dose: Take 1/2 tab Mon/Wed/Fri and take 1 tab Sun/Tues/Thurs/Sat  Dispense: 87 tablet; Refill: 0 - HYDROcodone-acetaminophen (NORCO) 10-325 MG tablet; Take 1 tab BID; For 3rd daily dose: Take 1/2 tab Mon/Wed/Fri and take 1 tab Sun/Tues/Thurs/Sat  Dispense: 87 tablet; Refill: 0 - baclofen (LIORESAL) 10 MG tablet; Take 1 tablet (10 mg total) by mouth 3 (three) times daily as needed for muscle spasms.  Dispense: 90 each; Refill: 1 - gabapentin (NEURONTIN) 300 MG capsule; Take 1 capsule (300 mg total) by mouth 3 (three) times daily. Emergency Supply while mail order comes in.  Dispense: 270 capsule; Refill: 3  4. Essential hypertension - losartan (COZAAR) 100 MG tablet; Take 1 tablet (100 mg total) by mouth at bedtime.  Dispense: 90 tablet; Refill: 3  5. Morbid obesity ([place]) - Discussed importance of 150 minutes of physical activity weekly, eat two servings of fish weekly, eat one serving of tree nuts ( cashews, pistachios, pecans, almonds.[name]) every other day, eat 6 servings of fruit/vegetables daily and drink plenty of water and avoid sweet beverages.   6. Type 2 diabetes mellitus without complication, without long-term current use of insulin (HCC) - glipiZIDE (GLUCOTROL) 10 MG tablet; Take 1 tablet (10 mg total) by mouth daily before breakfast.  Dispense: 90 tablet; Refill: 3 - Dulaglutide (TRULICITY) A999333 0000000 SOPN; Inject 0.75 mLs into the skin once a week.  Dispense: 2 mL; Refill: 3  7. BPH without urinary obstruction - tamsulosin (FLOMAX) 0.4 MG CAPS capsule; Take 1 capsule (0.4 mg total) by  mouth daily after supper.  Dispense: 90 capsule; Refill: 3  8. Major depressive disorder, recurrent episode, moderate (HCC) - FLUoxetine (PROZAC) 40 MG capsule; Take 1 capsule (40 mg total) by mouth daily.  Dispense: 90 capsule; Refill: 3  9. Anxiety - FLUoxetine (PROZAC) 40 MG capsule; Take 1 capsule (40 mg total) by mouth daily.  Dispense: 90 capsule; Refill: 3  10. Pure hypercholesterolemia - atorvastatin (LIPITOR) 40 MG tablet; Take 1 tablet (40 mg total) by mouth at bedtime.  Dispense: 90 tablet; Refill: 3  11. Aortic atherosclerosis (HCC) - Lipitor  12. 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  13. OSA (obstructive sleep apnea) - Compliant  I discussed the assessment and treatment plan with the patient. The patient was provided an opportunity to ask questions and all were answered. The patient agreed with the plan and demonstrated an understanding of the instructions.  The patient was  advised to call back or seek an in-person evaluation if the symptoms worsen or if the condition fails to improve as anticipated.  I provided 40 minutes of non-face-to-face time during this encounter.

## 2019-01-03 ENCOUNTER — Other Ambulatory Visit
Admission: RE | Admit: 2019-01-03 | Discharge: 2019-01-03 | Disposition: A | Discharge status: Home / Self Care | Payer: Medicare PPO | Attending: Physician Assistant | Admitting: Physician Assistant

## 2019-01-03 DIAGNOSIS — Z79899 Other long term (current) drug therapy: Secondary | ICD-10-CM

## 2019-01-03 DIAGNOSIS — I5189 Other ill-defined heart diseases: Secondary | ICD-10-CM | POA: Diagnosis present

## 2019-01-03 LAB — BASIC METABOLIC PANEL
Anion gap: 10 (ref 5–15)
BUN: 36 mg/dL — ABNORMAL HIGH (ref 8–23)
CO2: 24 mmol/L (ref 22–32)
Calcium: 9.3 mg/dL (ref 8.9–10.3)
Chloride: 105 mmol/L (ref 98–111)
Creatinine, Ser: 1.42 mg/dL — ABNORMAL HIGH (ref 0.61–1.24)
GFR calc Af Amer: 58 mL/min — ABNORMAL LOW (ref >60)
GFR calc non Af Amer: 50 mL/min — ABNORMAL LOW (ref >60)
Glucose, Bld: 192 mg/dL — ABNORMAL HIGH (ref 70–99)
Potassium: 4.5 mmol/L (ref 3.5–5.1)
Sodium: 139 mmol/L (ref 135–145)

## 2019-03-05 ENCOUNTER — Ambulatory Visit: Payer: Medicare PPO | Admitting: Internal Medicine

## 2019-03-19 ENCOUNTER — Other Ambulatory Visit: Payer: Self-pay

## 2019-03-19 ENCOUNTER — Ambulatory Visit: Payer: Medicare PPO | Admitting: Internal Medicine

## 2019-03-19 ENCOUNTER — Encounter: Payer: Self-pay | Admitting: Internal Medicine

## 2019-03-19 VITALS — BP 120/70 | HR 86 | Ht 74.5 in | Wt 279.5 lb

## 2019-03-19 DIAGNOSIS — E269 Hyperaldosteronism, unspecified: Secondary | ICD-10-CM | POA: Diagnosis not present

## 2019-03-19 DIAGNOSIS — Z79899 Other long term (current) drug therapy: Secondary | ICD-10-CM | POA: Diagnosis not present

## 2019-03-19 DIAGNOSIS — I152 Hypertension secondary to endocrine disorders: Secondary | ICD-10-CM

## 2019-03-19 DIAGNOSIS — R600 Localized edema: Secondary | ICD-10-CM

## 2019-03-19 NOTE — Patient Instructions (Signed)
Medication Instructions:  Your physician has recommended you make the following change in your medication:  1- STOP Amlodipine.  *If you need a refill on your cardiac medications before your next appointment, please call your pharmacy*  Lab Work: Your physician recommends that you return for lab work in: [place].  If you have labs (blood work) drawn today and your tests are completely normal, you will receive your results only by: [name] MyChart Message (if you have MyChart) OR . A paper copy in the mail If you have any lab test that is abnormal or we need to change your treatment, we will call you to review the results.  Testing/Procedures: none  Follow-Up: At [facility], you and your health needs are our priority.  As part of our continuing mission to provide you with exceptional heart care, we have created designated Provider Care Teams.  These Care Teams include your primary Cardiologist (physician) and Advanced Practice Providers (APPs -  Physician Assistants and Nurse Practitioners) who all work together to provide you with the care you need, when you need it.  Your next appointment:   3 month(s)  The format for your next appointment:   In Person  Provider:    You may see Nelva Bush, MD or one of the following Advanced Practice Providers on your designated Care Team:    Murray Hodgkins, NP  Christell Faith, PA-C  Marrianne Mood, PA-C

## 2019-03-19 NOTE — Progress Notes (Signed)
Follow-up Outpatient Visit Date: 03/19/2019  Primary Care Provider: Hubbard [facility]shorn, [place] [id number]  Chief Complaint: Follow-up secondary hypertension and leg swelling  HPI:  [name] is a 68 y.o. male with history of diastolic dysfunction, hyperaldosteronism secondary to adrenal hyperplasia, HTN, HLD, DM2, cancer of the lip, anxiety, depression, gout, and arthritis with chronic pain, who presents for follow-up of leg swelling.  I last saw him in August, at which time he reported continued intermittent leg edema.  We agreed to increase spironolactone to 75 mg daily and decrease metoprolol to 50 mg daily due to new first-degree AV block.  However, given bump in creatinine and potassium, spironolactone was decreased back to 50 mg daily.  Today, Evan Rivas reports that he has been feeling well.  His home blood pressure is typically around 130/70.  He has stable exertional dyspnea with strenuous activities.  He otherwise denies shortness of breath as well as chest pain, palpitations, lightheadedness, and orthopnea.  He is trying to come off medication, if possible, and inquires about consolidating his antihypertensive regimen.  --------------------------------------------------------------------------------------------------  Past Medical History:  Diagnosis Date  . Anxiety    panic attacks  . Arthritis   . Cancer (HCC)    lip  . Depression   . Diabetes mellitus without complication ([place])    type 2  . GERD (gastroesophageal reflux disease)   . Gout   . History of kidney stones   . Hyperaldosteronism ([place])    bilateral adrenal hyperplasia  . Hyperlipidemia   . Hypertension   . Pneumonia    2014 and 2018 ( 2018-klebsiella pneumonia    Past Surgical History:  Procedure Laterality Date  . ARTHROSCOPIC REPAIR ACL Left   . CYSTOSCOPY W/ URETERAL STENT PLACEMENT Right 09/07/2014   Procedure: CYSTOSCOPY WITH STENT REPLACEMENT;  Surgeon: Hollice Espy, MD;  Location: [facility];  Service: Urology;  Laterality: Right;  . CYSTOSCOPY/URETEROSCOPY/HOLMIUM LASER/STENT PLACEMENT Right 08/17/2014   Procedure: CYSTOSCOPY/URETEROSCOPY//STENT PLACEMENT/ attempt of lithotripsy;  Surgeon: Hollice Espy, MD;  Location: [facility];  Service: Urology;  Laterality: Right;  . HERNIA REPAIR    . MOHS SURGERY     lip  . TOTAL KNEE ARTHROPLASTY Left 04/24/2016   Procedure: LEFT TOTAL KNEE ARTHROPLASTY;  Surgeon: Gaynelle Arabian, MD;  Location: [facility];  Service: Orthopedics;  Laterality: Left;  Adductor Block  . TOTAL KNEE ARTHROPLASTY Right 10/23/2016   Procedure: RIGHT TOTAL KNEE ARTHROPLASTY;  Surgeon: Gaynelle Arabian, MD;  Location: [facility];  Service: Orthopedics;  Laterality: Right;  . URETEROSCOPY WITH HOLMIUM LASER LITHOTRIPSY Right 09/07/2014   Procedure: URETEROSCOPY WITH HOLMIUM LASER LITHOTRIPSY;  Surgeon: Hollice Espy, MD;  Location: [facility];  Service: Urology;  Laterality: Right;  Evan Rivas VASECTOMY      Current Meds  Medication Sig  . ASPIRIN 81 PO Take 81 mg by mouth daily.   Evan Rivas atorvastatin (LIPITOR) 40 MG tablet Take 1 tablet (40 mg total) by mouth at bedtime.  . baclofen (LIORESAL) 10 MG tablet Take 1 tablet (10 mg total) by mouth 3 (three) times daily as needed for muscle spasms.  . Dulaglutide (TRULICITY) A999333 0000000 SOPN Inject 0.75 mLs into the skin once a week.  Evan Rivas FLUoxetine (PROZAC) 40 MG capsule Take 1 capsule (40 mg total) by mouth daily.  . fluticasone (FLONASE) 50 MCG/ACT nasal spray Place 2 sprays into both nostrils daily. 2 sprays  . gabapentin (NEURONTIN) 300 MG capsule Take 1 capsule (300 mg total) by mouth  3 (three) times daily. Emergency Supply while mail order comes in.  Evan Rivas glipiZIDE (GLUCOTROL) 10 MG tablet Take 1 tablet (10 mg total) by mouth daily before breakfast.  . glucose blood (ACCU-CHEK AVIVA PLUS) test strip Use as instructed  . hydrochlorothiazide (HYDRODIURIL) 25 MG tablet Take 1 tablet (25 mg total) by mouth daily.  .  hydrOXYzine (ATARAX/VISTARIL) 10 MG tablet Take 1 tablet (10 mg total) by mouth every 8 (eight) hours as needed for anxiety.  Evan Rivas losartan (COZAAR) 100 MG tablet Take 1 tablet (100 mg total) by mouth at bedtime.  . metFORMIN (GLUCOPHAGE) 1000 MG tablet Take 1,000 mg by mouth 2 (two) times daily with a meal.  . metoprolol succinate (TOPROL-XL) 50 MG 24 hr tablet Take 1 tablet (50 mg total) by mouth daily. Take with or immediately following a meal.  . spironolactone (ALDACTONE) 25 MG tablet Take 2 tablets (50 mg) by mouth once daily  . tamsulosin (FLOMAX) 0.4 MG CAPS capsule Take 1 capsule (0.4 mg total) by mouth daily after supper.  . [DISCONTINUED] amLODipine (NORVASC) 5 MG tablet Take 1 tablet (5 mg total) by mouth daily.    Allergies: Celebrex  [celecoxib], Tetanus toxoids, and Tetanus-diphtheria toxoids td  Social History   Tobacco Use  . Smoking status: Never Smoker  . Smokeless tobacco: Never Used  Substance Use Topics  . Alcohol use: No  . Drug use: No    Family History  Problem Relation Age of Onset  . Hypertension Mother   . Cancer Mother   . Hyperlipidemia Mother   . Stroke Father   . Heart disease Father   . Depression Sister   . Hypertension Sister   . Hyperlipidemia Sister     Review of Systems: A 12-system review of systems was performed and was negative except as noted in the HPI.  --------------------------------------------------------------------------------------------------  Physical Exam: BP 120/70 (BP Location: Left Arm, Patient Position: Sitting, Cuff Size: Normal)   Pulse 86   Ht 6' 2.5" (1.892 m)   Wt 279 lb 8 oz (126.8 kg)   SpO2 98%   BMI 35.41 kg/m   General:  NAD. HEENT: No conjunctival pallor or scleral icterus. Facemask in place. Neck: Supple without lymphadenopathy, thyromegaly, JVD, or HJR. Lungs: Normal work of breathing. Clear to auscultation bilaterally without wheezes or crackles. Heart: Regular rate and rhythm without murmurs,  rubs, or gallops. Non-displaced PMI. Abd: Bowel sounds present. Soft, NT/ND without hepatosplenomegaly Ext: No lower extremity edema. Radial, PT, and DP pulses are 2+ bilaterally. Skin: Warm and dry without rash.  EKG:  Normal sinus rhythm with low voltage.  Otherwise, no significant abnormality.  Lab Results  Component Value Date   WBC 5.9 04/24/2017   HGB 14.4 04/24/2017   HCT 42.7 04/24/2017   MCV 93 04/24/2017   PLT 216 04/24/2017    Lab Results  Component Value Date   NA 142 03/19/2019   K 5.0 03/19/2019   CL 104 03/19/2019   CO2 22 03/19/2019   BUN 31 (H) 03/19/2019   CREATININE 1.37 (H) 03/19/2019   GLUCOSE 181 (H) 03/19/2019   ALT 16 09/17/2017    Lab Results  Component Value Date   CHOL 147 06/28/2018   HDL 32 (L) 06/28/2018   LDLCALC 78 06/28/2018   TRIG 305 (H) 06/28/2018   CHOLHDL 4.6 06/28/2018    --------------------------------------------------------------------------------------------------  ASSESSMENT AND PLAN: Secondary hypertension due to hyperaldosteronism: BP well controlled.  Mr. Music did not tolerate escalation of spironolactone due to worsening creatinine.  We will therefore continue with spironolactone 50 mg daily.  In an effort to reduce the number of medications that he is taking, we have agreed to stop amlodipine.  He should contact us if his blood pressure rises and is consistently above 130/80.  He should continue current doses of metoprolol, HCTZ, and losartan.  I will recheck a BMP today to ensure stable renal function and potassium.  Leg edema: Resolved.  Continue HCTZ and spironolactone, as above.  Follow-up: Return to clinic in 3 months.  Nelva Bush, MD 03/20/2019 9:02 PM

## 2019-03-20 ENCOUNTER — Encounter: Payer: Self-pay | Admitting: Internal Medicine

## 2019-03-20 LAB — BASIC METABOLIC PANEL
BUN/Creatinine Ratio: 23 (ref 10–24)
BUN: 31 mg/dL — ABNORMAL HIGH (ref 8–27)
CO2: 22 mmol/L (ref 20–29)
Calcium: 9.4 mg/dL (ref 8.6–10.2)
Chloride: 104 mmol/L (ref 96–106)
Creatinine, Ser: 1.37 mg/dL — ABNORMAL HIGH (ref 0.76–1.27)
GFR calc Af Amer: 61 mL/min/{1.73_m2} (ref >59)
GFR calc non Af Amer: 53 mL/min/{1.73_m2} — ABNORMAL LOW (ref >59)
Glucose: 181 mg/dL — ABNORMAL HIGH (ref 65–99)
Potassium: 5 mmol/L (ref 3.5–5.2)
Sodium: 142 mmol/L (ref 134–144)

## 2019-04-03 ENCOUNTER — Other Ambulatory Visit: Payer: Self-pay

## 2019-04-03 ENCOUNTER — Encounter: Payer: Self-pay | Admitting: Family Medicine

## 2019-04-03 ENCOUNTER — Ambulatory Visit (INDEPENDENT_AMBULATORY_CARE_PROVIDER_SITE_OTHER): Payer: Medicare PPO | Admitting: Family Medicine

## 2019-04-03 DIAGNOSIS — I10 Essential (primary) hypertension: Secondary | ICD-10-CM

## 2019-04-03 DIAGNOSIS — Z79899 Other long term (current) drug therapy: Secondary | ICD-10-CM

## 2019-04-03 DIAGNOSIS — R600 Localized edema: Secondary | ICD-10-CM

## 2019-04-03 DIAGNOSIS — I700 Atherosclerosis of aorta: Secondary | ICD-10-CM

## 2019-04-03 DIAGNOSIS — M5136 Other intervertebral disc degeneration, lumbar region: Secondary | ICD-10-CM

## 2019-04-03 DIAGNOSIS — F1120 Opioid dependence, uncomplicated: Secondary | ICD-10-CM | POA: Diagnosis not present

## 2019-04-03 DIAGNOSIS — N400 Enlarged prostate without lower urinary tract symptoms: Secondary | ICD-10-CM

## 2019-04-03 DIAGNOSIS — M1A4720 Other secondary chronic gout, left ankle and foot, without tophus (tophi): Secondary | ICD-10-CM

## 2019-04-03 DIAGNOSIS — E119 Type 2 diabetes mellitus without complications: Secondary | ICD-10-CM

## 2019-04-03 DIAGNOSIS — F419 Anxiety disorder, unspecified: Secondary | ICD-10-CM

## 2019-04-03 DIAGNOSIS — E7800 Pure hypercholesterolemia, unspecified: Secondary | ICD-10-CM

## 2019-04-03 DIAGNOSIS — F331 Major depressive disorder, recurrent, moderate: Secondary | ICD-10-CM

## 2019-04-03 DIAGNOSIS — G4733 Obstructive sleep apnea (adult) (pediatric): Secondary | ICD-10-CM

## 2019-04-03 MED ORDER — HYDROCODONE-ACETAMINOPHEN 10-325 MG PO TABS: ORAL_TABLET | ORAL | 0 refills | Status: DC

## 2019-04-03 MED ORDER — HYDROCHLOROTHIAZIDE 25 MG PO TABS: 25.0000 mg | ORAL_TABLET | Freq: Every day | ORAL | 3 refills | Status: DC

## 2019-04-03 MED ORDER — HYDROCODONE-ACETAMINOPHEN 10-325 MG PO TABS: ORAL_TABLET | ORAL | 0 refills | Status: AC

## 2019-04-03 MED ORDER — DULOXETINE HCL 20 MG PO CPEP: 20.0000 mg | ORAL_CAPSULE | Freq: Every day | ORAL | 1 refills | Status: DC

## 2019-04-03 MED ORDER — METFORMIN HCL 1000 MG PO TABS: 1000.0000 mg | ORAL_TABLET | Freq: Two times a day (BID) | ORAL | 3 refills | Status: DC

## 2019-04-03 MED ORDER — FLUOXETINE HCL 20 MG PO TABS: 20.0000 mg | ORAL_TABLET | Freq: Every day | ORAL | 0 refills | Status: DC

## 2019-04-03 MED ORDER — TIZANIDINE HCL 4 MG PO TABS: 4.0000 mg | ORAL_TABLET | Freq: Three times a day (TID) | ORAL | 3 refills | Status: DC | PRN

## 2019-04-03 NOTE — Progress Notes (Signed)
Name: Evan Rivas   MRN: [id number]    DOB: 1950-04-15   Date:04/03/2019       Progress Note  Subjective  Chief Complaint  Chief Complaint  Patient presents with  . Diabetes  . Hyperlipidemia  . Hypertension  . Medication Refill    I connected with  Evan Rivas  on 04/03/19 at 11:20 AM EST by a video enabled telemedicine application and verified that I am speaking with the correct person using two identifiers.  I discussed the limitations of evaluation and management by telemedicine and the availability of in person appointments. The patient expressed understanding and agreed to proceed. Staff also discussed with the patient that there may be a patient responsible charge related to this service. Patient Location: Home Provider Location: Office Additional Individuals present: None  HPI  Chronic Pain Management: -DDD/chronic back pain: He has not been able to attend the gym due to COVID-19 pandemic surge, no home exercise. There is no radiation into the legs recently, no weakness in legs. He has done PT for his back in the past and it seemed to help once he was able to tolerate the exercises - does not want to return.  - He has never had any spinal surgeries; Dr. [name] (pain management) did not do any procedures with [name] as Mr. Cooler declined.  Dr. [name] requested that the patient's narcotic pain medication Rx be transferred to our clinic to help with financial burden of specialty visits for the patient, therefore Evan Rivas has been receiving his pain management medication from myself rather than pain management.  At this time, we will maintain Rx's with our clinic - due for drug screening and controlled substance contract renewal. - Medications: Taking baclofen, however feels tizanidine worked better, so we will switch back today.  Norco - Taking 10-325mg  TID with 1/2 tablet for 1 dose 3 days a week.  Gabapentin 300mg  TID. Discussed Narcan auto-injector -  has at home and knows how to use it. - Controlled Substance Database is checked and no suspicious findings.  Pt has been consistently very compliant.  Hyperaldosteronism:He saw endocrinology last in July; per [name] notes, it is not thought to be cancer related, but more likely related to adrenal adenoma; however not a surgical candidate at this time.  Seeing Cardiology for HTN management - taking spironolactone. No longer planning to see [name]. Most recent kidney function was stable, electrolytes WNL.   DM:Was referred to endocrinology for DM, but was sent back to me as hislast A1C was at goal range. He is taking trulicity, metformin 2000mg  XR BID, and still taking glipizide 10mg  once dailywithout issues. Denies polydipsia, polyphagia, polyuria.  Eye exam in UTD (has follow up June 16 2018), due for urine micro.  On ARB and statin. Due for labs  CKD Stage 3: He had decreased GFR with Cardiology 2 weeks ago abd was stable.  He is taking ARB for BP and protection.  Off of nabumetone   HTN: Managed by Cardiology now - titrating spironolactone. Yesterday BP  136/86, 130/79. - Taking medications as prescribed,denies any missed doses. Losartan 100mg , Metoprolol 50mg  XL, spironolactone 50mg  daily.  Off of amlodipine.   -taking medications as instructed, no medication side effects noted, no TIAs, no chest pain on exertion, no dyspnea on exertion.- DASH diet discussed - ptdoes notfollow a low sodium diet;eats diet heavy in carbs, processed foods but does report trying to bake most meats. - The followingarecontributing factors: sedentary lifestyle,  saturated fats in diet, high sodium diet  Obesity: Weight Management History:difficulty managing weight even when he follows low carb diet and exercises due to COVID-19 pandemic.  Diet:heavy in carbs and processed foods, sugars. States he sometimes overeats but tries to manage portions as much as possible. Education is provided  again today. Exercise:not activecurrently - discussed again that he needs to find a way to move and exercise in a safe and healthy manner.   BLE Edema/Diastolic Dysfunction/HLD:Seeing Dr. [name] and Dunn PA-C for management now -on spironolactone, metoprolol XL 50mg .  Swelling has improved since stopping amlodipine, on statin therapy, wearing compression stockings daily without issue.  Depression and Anxiety:Currently onprozac 40mg  and doing well. States anxiety is normally well controlled,some days are worse than others - feeling a bit isolated right now with the pandemic. Has hydroxyzine prn for anxiety. Is takingitabout every other day which is working well for him.He is keeping up with family as he is able; has friends he keeps up with during football season. He wants to either increase his dosing or change - we discussed cymbalta, we will switch over to this today  Daytime Sleepiness/OSA:still dealing with daytime drowsiness which has not really changed much.Using CPAP nightly now for sleep apneafor about 5.5 hours a night -started on 04/25/18.  Returning to pulmonology has been cost prohibitive for him, we will switch his antidepressant today to see if this helps with energy levels.  [id number] to have no issues with nocturia.Doing well on flomax without issue.  Gout: D/C allopurinol due to recent kidney function decrease and no recent gout flares.No concerns today.  Patient Active Problem List   Diagnosis Date Noted  . Hyperaldosteronism ([place]) 06/24/2018  . Diastolic dysfunction 0000000  . OSA (obstructive sleep apnea) 06/24/2018  . Excessive daytime sleepiness 06/24/2018  . Aortic atherosclerosis ([place]) 06/17/2018  . Leg edema 12/18/2017  . BPH without urinary obstruction 06/03/2015  . Depression 03/05/2015  . Hyperlipidemia 09/03/2014  . Major depressive disorder, recurrent episode, moderate ([place]) 09/03/2014  . Chronic low back pain 09/03/2014  . Arthralgia of  both knees 09/03/2014  . Type 2 diabetes mellitus without complication ([place]) 123456  . Allergic rhinitis 07/04/2014  . Anxiety 07/04/2014  . Continuous opioid dependence ([place]) 07/04/2014  . Diabetes mellitus type 2 in obese ([place]) 07/04/2014  . DDD (degenerative disc disease), lumbar 07/04/2014  . Gout 07/04/2014  . Essential hypertension 07/04/2014  . Flu vaccine need 07/04/2014  . Morbid obesity ([place]) 07/04/2014  . OA (osteoarthritis) of knee 07/04/2014  . Neuralgia neuritis, sciatic nerve 07/04/2014  . Arthralgia of multiple joints 07/04/2014  . H/O malignant neoplasm of skin 12/24/2013    Past Surgical History:  Procedure Laterality Date  . ARTHROSCOPIC REPAIR ACL Left   . CYSTOSCOPY W/ URETERAL STENT PLACEMENT Right 09/07/2014   Procedure: CYSTOSCOPY WITH STENT REPLACEMENT;  Surgeon: Hollice Espy, MD;  Location: [facility];  Service: Urology;  Laterality: Right;  . CYSTOSCOPY/URETEROSCOPY/HOLMIUM LASER/STENT PLACEMENT Right 08/17/2014   Procedure: CYSTOSCOPY/URETEROSCOPY//STENT PLACEMENT/ attempt of lithotripsy;  Surgeon: Hollice Espy, MD;  Location: [facility];  Service: Urology;  Laterality: Right;  . HERNIA REPAIR    . MOHS SURGERY     lip  . TOTAL KNEE ARTHROPLASTY Left 04/24/2016   Procedure: LEFT TOTAL KNEE ARTHROPLASTY;  Surgeon: Gaynelle Arabian, MD;  Location: [facility];  Service: Orthopedics;  Laterality: Left;  Adductor Block  . TOTAL KNEE ARTHROPLASTY Right 10/23/2016   Procedure: RIGHT TOTAL KNEE ARTHROPLASTY;  Surgeon: Gaynelle Arabian, MD;  Location: [facility];  Service: Orthopedics;  Laterality: Right;  . URETEROSCOPY WITH HOLMIUM LASER LITHOTRIPSY Right 09/07/2014   Procedure: URETEROSCOPY WITH HOLMIUM LASER LITHOTRIPSY;  Surgeon: Hollice Espy, MD;  Location: [facility];  Service: Urology;  Laterality: Right;  [name] VASECTOMY      Family History  Problem Relation Age of Onset  . Hypertension Mother   . Cancer Mother   . Hyperlipidemia Mother   . Stroke Father   . Heart  disease Father   . Depression Sister   . Hypertension Sister   . Hyperlipidemia Sister     Social History   Socioeconomic History  . Marital status: Married    Spouse name: Hoyle Sauer  . Number of children: 1  . Years of education: Not on file  . Highest education level: Not on file  Occupational History  . Occupation: retired  Tobacco Use  . Smoking status: Never Smoker  . Smokeless tobacco: Never Used  Substance and Sexual Activity  . Alcohol use: No  . Drug use: No  . Sexual activity: Yes  Other Topics Concern  . Not on file  Social History Narrative  . Not on file   Social Determinants of Health   Financial Resource Strain:   . Difficulty of Paying Living Expenses: Not on file  Food Insecurity:   . Worried About Charity fundraiser in the Last Year: Not on file  . Ran Out of Food in the Last Year: Not on file  Transportation Needs:   . Lack of Transportation (Medical): Not on file  . Lack of Transportation (Non-Medical): Not on file  Physical Activity: Inactive  . Days of Exercise per Week: 0 days  . Minutes of Exercise per Session: 0 min  Stress:   . Feeling of Stress : Not on file  Social Connections:   . Frequency of Communication with Friends and Family: Not on file  . Frequency of Social Gatherings with Friends and Family: Not on file  . Attends Religious Services: Not on file  . Active Member of Clubs or Organizations: Not on file  . Attends Archivist Meetings: Not on file  . Marital Status: Not on file  Intimate Partner Violence: Unknown  . Fear of Current or Ex-Partner: Not on file  . Emotionally Abused: Not on file  . Physically Abused: No  . Sexually Abused: Not on file     Current Outpatient Medications:  .  ASPIRIN 81 PO, Take 81 mg by mouth daily. , Disp: , Rfl:  .  atorvastatin (LIPITOR) 40 MG tablet, Take 1 tablet (40 mg total) by mouth at bedtime., Disp: 90 tablet, Rfl: 3 .  baclofen (LIORESAL) 10 MG tablet, Take 1 tablet (10 mg  total) by mouth 3 (three) times daily as needed for muscle spasms., Disp: 90 each, Rfl: 1 .  Dulaglutide (TRULICITY) A999333 0000000 SOPN, Inject 0.75 mLs into the skin once a week., Disp: 2 mL, Rfl: 3 .  FLUoxetine (PROZAC) 40 MG capsule, Take 1 capsule (40 mg total) by mouth daily., Disp: 90 capsule, Rfl: 3 .  fluticasone (FLONASE) 50 MCG/ACT nasal spray, Place 2 sprays into both nostrils daily. 2 sprays, Disp: 16 g, Rfl: 6 .  glipiZIDE (GLUCOTROL) 10 MG tablet, Take 1 tablet (10 mg total) by mouth daily before breakfast., Disp: 90 tablet, Rfl: 3 .  glucose blood (ACCU-CHEK AVIVA PLUS) test strip, Use as instructed, Disp: 100 each, Rfl: 12 .  hydrochlorothiazide (HYDRODIURIL) 25 MG tablet, Take 1 tablet (25 mg  total) by mouth daily., Disp: 90 tablet, Rfl: 0 .  hydrOXYzine (ATARAX/VISTARIL) 10 MG tablet, Take 1 tablet (10 mg total) by mouth every 8 (eight) hours as needed for anxiety., Disp: 30 tablet, Rfl: 1 .  losartan (COZAAR) 100 MG tablet, Take 1 tablet (100 mg total) by mouth at bedtime., Disp: 90 tablet, Rfl: 3 .  metFORMIN (GLUCOPHAGE) 1000 MG tablet, Take 1,000 mg by mouth 2 (two) times daily with a meal., Disp: , Rfl:  .  spironolactone (ALDACTONE) 25 MG tablet, Take 2 tablets (50 mg) by mouth once daily, Disp: 270 tablet, Rfl: 3 .  tamsulosin (FLOMAX) 0.4 MG CAPS capsule, Take 1 capsule (0.4 mg total) by mouth daily after supper., Disp: 90 capsule, Rfl: 3 .  gabapentin (NEURONTIN) 300 MG capsule, Take 1 capsule (300 mg total) by mouth 3 (three) times daily. Emergency Supply while mail order comes in., Disp: 270 capsule, Rfl: 3 .  metoprolol succinate (TOPROL-XL) 50 MG 24 hr tablet, Take 1 tablet (50 mg total) by mouth daily. Take with or immediately following a meal., Disp: 90 tablet, Rfl: 3  Allergies  Allergen Reactions  . Celebrex  [Celecoxib]     GI  . Tetanus Toxoids Other (See Comments)    unkown  . Tetanus-Diphtheria Toxoids Td     Other reaction(s): Unknown    I personally  reviewed active problem list, medication list, allergies, notes from last encounter, lab results with the patient/caregiver today.   ROS Ten systems reviewed and is negative except as mentioned in HPI  Objective  Virtual encounter, vitals not obtained.  There is no height or weight on file to calculate BMI.  Physical Exam  Pulmonary/Chest: Effort normal. No respiratory distress. Speaking in complete sentences Neurological: Pt is alert and oriented to person, place, and time. Coordination, speech and gait are normal.  Psychiatric: Patient has a normal mood and affect. behavior is normal. Judgment and thought content normal.  No results found for this or any previous visit (from the past 72 hour(s)).  PHQ2/9: Depression screen [facility] 2/9 04/03/2019 01/01/2019 09/24/2018 09/24/2018 08/21/2018  Decreased Interest 0 0 0 0 0  Down, Depressed, Hopeless 0 1 1 1 1   PHQ - 2 Score 0 1 1 1 1   Altered sleeping 0 0 0 0 0  Tired, decreased energy 0 1 0 0 0  Change in appetite 0 0 0 0 0  Feeling bad or failure about yourself  0 0 0 0 0  Trouble concentrating 0 0 0 0 0  Moving slowly or fidgety/restless 0 0 0 0 0  Suicidal thoughts 0 0 0 0 0  PHQ-9 Score 0 2 1 1 1   Difficult doing work/chores Not difficult at all Somewhat difficult - Not difficult at all Not difficult at all  Some recent data might be hidden   PHQ-2/9 Result is negative.    Fall Risk: Fall Risk  04/03/2019 01/01/2019 09/24/2018 09/24/2018 08/21/2018  Falls in the past year? 0 0 1 0 0  Number falls in past yr: 0 0 1 0 0  Injury with Fall? 0 0 1 0 0  Risk for fall due to : - - History of fall(s) - -  Follow up Falls evaluation completed - Falls prevention discussed - -     Assessment & Plan  1. DDD (degenerative disc disease), lumbar - D/c baclofen, continue gabapentin and norco, start tizanidine. - tiZANidine (ZANAFLEX) 4 MG tablet; Take 1 tablet (4 mg total) by mouth every 8 (eight)  hours as needed for muscle spasms.   Dispense: 60 tablet; Refill: 3 - HYDROcodone-acetaminophen (NORCO) 10-325 MG tablet; Take 1 tab BID; For 3rd daily dose: Take 1/2 tab Mon/Wed/Fri and take 1 tab Sun/Tues/Thurs/Sat  Dispense: 87 tablet; Refill: 0 - HYDROcodone-acetaminophen (NORCO) 10-325 MG tablet; Take 1 tab BID; For 3rd daily dose: Take 1/2 tab Mon/Wed/Fri and take 1 tab Sun/Tues/Thurs/Sat  Dispense: 87 tablet; Refill: 0 - HYDROcodone-acetaminophen (NORCO) 10-325 MG tablet; Take 1 tab BID; For 3rd daily dose: Take 1/2 tab Mon/Wed/Fri and take 1 tab Sun/Tues/Thurs/Sat  Dispense: 87 tablet; Refill: 0 - Drugs of abuse screen w/o alc (for [place] OP)  2. Continuous opioid dependence (HCC) - HYDROcodone-acetaminophen (NORCO) 10-325 MG tablet; Take 1 tab BID; For 3rd daily dose: Take 1/2 tab Mon/Wed/Fri and take 1 tab Sun/Tues/Thurs/Sat  Dispense: 87 tablet; Refill: 0 - HYDROcodone-acetaminophen (NORCO) 10-325 MG tablet; Take 1 tab BID; For 3rd daily dose: Take 1/2 tab Mon/Wed/Fri and take 1 tab Sun/Tues/Thurs/Sat  Dispense: 87 tablet; Refill: 0 - HYDROcodone-acetaminophen (NORCO) 10-325 MG tablet; Take 1 tab BID; For 3rd daily dose: Take 1/2 tab Mon/Wed/Fri and take 1 tab Sun/Tues/Thurs/Sat  Dispense: 87 tablet; Refill: 0 - Drugs of abuse screen w/o alc (for [place] OP)  3. Controlled substance agreement signed - Due for drug screening and renewal of controlled substance agreement for 2021. - HYDROcodone-acetaminophen (NORCO) 10-325 MG tablet; Take 1 tab BID; For 3rd daily dose: Take 1/2 tab Mon/Wed/Fri and take 1 tab Sun/Tues/Thurs/Sat  Dispense: 87 tablet; Refill: 0 - HYDROcodone-acetaminophen (NORCO) 10-325 MG tablet; Take 1 tab BID; For 3rd daily dose: Take 1/2 tab Mon/Wed/Fri and take 1 tab Sun/Tues/Thurs/Sat  Dispense: 87 tablet; Refill: 0 - HYDROcodone-acetaminophen (NORCO) 10-325 MG tablet; Take 1 tab BID; For 3rd daily dose: Take 1/2 tab Mon/Wed/Fri and take 1 tab Sun/Tues/Thurs/Sat  Dispense: 87 tablet; Refill: 0 - Drugs of abuse  screen w/o alc (for [place] OP)  4. Essential hypertension - Microalbumin / creatinine urine ratio - COMPLETE METABOLIC PANEL WITH GFR - hydrochlorothiazide (HYDRODIURIL) 25 MG tablet; Take 1 tablet (25 mg total) by mouth daily.  Dispense: 90 tablet; Refill: 3  5. Morbid obesity ([place]) - Discussed importance of 150 minutes of physical activity weekly, eat two servings of fish weekly, eat one serving of tree nuts ( cashews, pistachios, pecans, almonds.[name]) every other day, eat 6 servings of fruit/vegetables daily and drink plenty of water and avoid sweet beverages.   6. Type 2 diabetes mellitus without complication, without long-term current use of insulin (HCC) - Microalbumin / creatinine urine ratio - COMPLETE METABOLIC PANEL WITH GFR - Hemoglobin A1c - metFORMIN (GLUCOPHAGE) 1000 MG tablet; Take 1 tablet (1,000 mg total) by mouth 2 (two) times daily with a meal.  Dispense: 180 tablet; Refill: 3  7. BPH without urinary obstruction - PSA  8. Major depressive disorder, recurrent episode, moderate (HCC) - We will switch from Prozac to Cymbalta today to see if this helps with depression, fatigue, and pain controlled.  Will start low dose with option to increase to BID 20mg  dosing depending on how patient is tolerating. - DULoxetine (CYMBALTA) 20 MG capsule; Take 1 capsule (20 mg total) by mouth daily.  Dispense: 90 capsule; Refill: 1 - FLUoxetine (PROZAC) 20 MG tablet; Take 1 tablet (20 mg total) by mouth daily. Then STOP medication  Dispense: 7 tablet; Refill: 0  9. Anxiety - DULoxetine (CYMBALTA) 20 MG capsule; Take 1 capsule (20 mg total) by mouth daily.  Dispense: 90 capsule; Refill: 1 -  FLUoxetine (PROZAC) 20 MG tablet; Take 1 tablet (20 mg total) by mouth daily. Then STOP medication  Dispense: 7 tablet; Refill: 0  10. Pure hypercholesterolemia - Statin therapy, seeing cardiology  11. Aortic atherosclerosis (HCC) - Statin therapy, following with cardiology  12. 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  13. OSA (obstructive sleep apnea) - Needs pulmonology follow up for evaluation of mask fit and likely CPAP titration, however he states this is very cost prohibitive at this time and declines referral.  14. Bilateral lower extremity edema - Stable since wearing compression stockings and having amlodipine removed from his regimen. - hydrochlorothiazide (HYDRODIURIL) 25 MG tablet; Take 1 tablet (25 mg total) by mouth daily.  Dispense: 90 tablet; Refill: 3   I discussed the assessment and treatment plan with the patient. The patient was provided an opportunity to ask questions and all were answered. The patient agreed with the plan and demonstrated an understanding of the instructions.  The patient was advised to call back or seek an in-person evaluation if the symptoms worsen or if the condition fails to improve as anticipated.  I provided 52 minutes of non-face-to-face time during this encounter.

## 2019-04-07 ENCOUNTER — Encounter: Payer: Self-pay | Admitting: Family Medicine

## 2019-04-08 ENCOUNTER — Ambulatory Visit: Payer: Self-pay | Admitting: Pharmacist

## 2019-04-08 DIAGNOSIS — E119 Type 2 diabetes mellitus without complications: Secondary | ICD-10-CM

## 2019-04-09 ENCOUNTER — Other Ambulatory Visit: Payer: Self-pay | Admitting: Family Medicine

## 2019-04-09 DIAGNOSIS — I10 Essential (primary) hypertension: Secondary | ICD-10-CM

## 2019-04-09 DIAGNOSIS — N1831 Chronic kidney disease, stage 3a: Secondary | ICD-10-CM | POA: Insufficient documentation

## 2019-04-09 DIAGNOSIS — N1832 Chronic kidney disease, stage 3b: Secondary | ICD-10-CM

## 2019-04-09 DIAGNOSIS — E1169 Type 2 diabetes mellitus with other specified complication: Secondary | ICD-10-CM

## 2019-04-09 LAB — COMPLETE METABOLIC PANEL WITH GFR
AG Ratio: 1.4 (calc) (ref 1.0–2.5)
ALT: 24 U/L (ref 9–46)
AST: 14 U/L (ref 10–35)
Albumin: 3.9 g/dL (ref 3.6–5.1)
Alkaline phosphatase (APISO): 78 U/L (ref 35–144)
BUN/Creatinine Ratio: 21 (calc) (ref 6–22)
BUN: 34 mg/dL — ABNORMAL HIGH (ref 7–25)
CO2: 27 mmol/L (ref 20–32)
Calcium: 9.4 mg/dL (ref 8.6–10.3)
Chloride: 107 mmol/L (ref 98–110)
Creat: 1.59 mg/dL — ABNORMAL HIGH (ref 0.70–1.25)
GFR, Est African American: 51 mL/min/{1.73_m2} — ABNORMAL LOW (ref >=60)
GFR, Est Non African American: 44 mL/min/{1.73_m2} — ABNORMAL LOW (ref >=60)
Globulin: 2.7 g/dL (calc) (ref 1.9–3.7)
Glucose, Bld: 204 mg/dL — ABNORMAL HIGH (ref 65–99)
Potassium: 4.8 mmol/L (ref 3.5–5.3)
Sodium: 142 mmol/L (ref 135–146)
Total Bilirubin: 0.3 mg/dL (ref 0.2–1.2)
Total Protein: 6.6 g/dL (ref 6.1–8.1)

## 2019-04-09 LAB — PAIN MGMT, PROFILE 1 CONF W/O MM, U
Amphetamines: NEGATIVE ng/mL
Barbiturates: NEGATIVE ng/mL
Benzodiazepines: NEGATIVE ng/mL
Cocaine Metabolite: NEGATIVE ng/mL
Codeine: NEGATIVE ng/mL
Creatinine: 161.7 mg/dL
Hydrocodone: 1836 ng/mL
Hydromorphone: 147 ng/mL
Marijuana Metabolite: NEGATIVE ng/mL
Methadone Metabolite: NEGATIVE ng/mL
Morphine: NEGATIVE ng/mL
Norhydrocodone: 4496 ng/mL
Opiates: POSITIVE ng/mL
Oxidant: NEGATIVE ug/mL
Oxycodone: NEGATIVE ng/mL
Phencyclidine: NEGATIVE ng/mL
pH: 5.5 (ref 4.5–9.0)

## 2019-04-09 LAB — MICROALBUMIN / CREATININE URINE RATIO
Creatinine, Urine: 165 mg/dL (ref 20–320)
Microalb Creat Ratio: 66 mcg/mg creat — ABNORMAL HIGH (ref <30)
Microalb, Ur: 10.9 mg/dL

## 2019-04-09 LAB — HEMOGLOBIN A1C
Hgb A1c MFr Bld: 7.3 % of total Hgb — ABNORMAL HIGH (ref <5.7)
Mean Plasma Glucose: 163 (calc)
eAG (mmol/L): 9 (calc)

## 2019-04-09 LAB — PSA: PSA: 0.6 ng/mL (ref <=4.0)

## 2019-04-09 NOTE — Chronic Care Management (AMB) (Signed)
  Chronic Care Management   Note  04/09/2019 Name: Evan Rivas MRN: [id number] DOB: 09/24/1950  69 y.o. year old male referred to Chronic Care Management clinical pharmacy services for medication assistance. Outreach today for 2021 assistance planning  Was unable to reach patient via telephone today and have left HIPAA compliant voicemail asking patient to return my call. (unsuccessful outreach #1).  Follow up plan: A HIPPA compliant phone message was left for the patient providing contact information and requesting a return call.  The care management team will reach out to the patient again over the next 5-7 days.   [name], PharmD Clinical Pharmacist [facility] [phone]

## 2019-04-15 ENCOUNTER — Ambulatory Visit (INDEPENDENT_AMBULATORY_CARE_PROVIDER_SITE_OTHER): Payer: Medicare PPO | Admitting: Pharmacist

## 2019-04-15 DIAGNOSIS — E1169 Type 2 diabetes mellitus with other specified complication: Secondary | ICD-10-CM

## 2019-04-15 DIAGNOSIS — N1832 Chronic kidney disease, stage 3b: Secondary | ICD-10-CM

## 2019-04-15 DIAGNOSIS — E669 Obesity, unspecified: Secondary | ICD-10-CM | POA: Diagnosis not present

## 2019-04-15 NOTE — Chronic Care Management (AMB) (Signed)
Chronic Care Management   Follow Up Note   04/15/2019 Name: CEEJAY MARRIN MRN: [id number] DOB: October 02, 1950  Subjective ALPHONZA FELTHAM is a 69 y.o. year old male who is a primary care patient of Hubbard Hartshorn, FNP. The CCM clinical pharmacist outreached to patient via telephone today for [id number] Trulicity medication assistance. HIPAA identifiers verified.  Review of patient status, including review of consultants reports, relevant laboratory and other test results, and collaboration with appropriate care team members and the patient's provider was performed as part of comprehensive patient evaluation and provision of chronic care management services.    Objective Outpatient Encounter Medications as of 04/15/2019  Medication Sig  . ASPIRIN 81 PO Take 81 mg by mouth daily.   [name] atorvastatin (LIPITOR) 40 MG tablet Take 1 tablet (40 mg total) by mouth at bedtime.  . Dulaglutide (TRULICITY) A999333 0000000 SOPN Inject 0.75 mLs into the skin once a week.  . DULoxetine (CYMBALTA) 20 MG capsule Take 1 capsule (20 mg total) by mouth daily.  [name] FLUoxetine (PROZAC) 20 MG tablet Take 1 tablet (20 mg total) by mouth daily. Then STOP medication  . fluticasone (FLONASE) 50 MCG/ACT nasal spray Place 2 sprays into both nostrils daily. 2 sprays  . gabapentin (NEURONTIN) 300 MG capsule Take 1 capsule (300 mg total) by mouth 3 (three) times daily. Emergency Supply while mail order comes in.  [name] glipiZIDE (GLUCOTROL) 10 MG tablet Take 1 tablet (10 mg total) by mouth daily before breakfast.  . glucose blood (ACCU-CHEK AVIVA PLUS) test strip Use as instructed  . hydrochlorothiazide (HYDRODIURIL) 25 MG tablet Take 1 tablet (25 mg total) by mouth daily.  [name] HYDROcodone-acetaminophen (NORCO) 10-325 MG tablet Take 1 tab BID; For 3rd daily dose: Take 1/2 tab Mon/Wed/Fri and take 1 tab Sun/Tues/Thurs/Sat  . [START ON 05/03/2019] HYDROcodone-acetaminophen (NORCO) 10-325 MG tablet Take 1 tab BID; For 3rd daily dose: Take  1/2 tab Mon/Wed/Fri and take 1 tab Sun/Tues/Thurs/Sat  . [START ON 06/02/2019] HYDROcodone-acetaminophen (NORCO) 10-325 MG tablet Take 1 tab BID; For 3rd daily dose: Take 1/2 tab Mon/Wed/Fri and take 1 tab Sun/Tues/Thurs/Sat  . hydrOXYzine (ATARAX/VISTARIL) 10 MG tablet Take 1 tablet (10 mg total) by mouth every 8 (eight) hours as needed for anxiety.  [name] losartan (COZAAR) 100 MG tablet Take 1 tablet (100 mg total) by mouth at bedtime.  . metFORMIN (GLUCOPHAGE) 1000 MG tablet Take 1 tablet (1,000 mg total) by mouth 2 (two) times daily with a meal.  . metoprolol succinate (TOPROL-XL) 50 MG 24 hr tablet Take 1 tablet (50 mg total) by mouth daily. Take with or immediately following a meal.  . spironolactone (ALDACTONE) 25 MG tablet Take 2 tablets (50 mg) by mouth once daily  . tamsulosin (FLOMAX) 0.4 MG CAPS capsule Take 1 capsule (0.4 mg total) by mouth daily after supper.  [name] tiZANidine (ZANAFLEX) 4 MG tablet Take 1 tablet (4 mg total) by mouth every 8 (eight) hours as needed for muscle spasms.   No facility-administered encounter medications on file as of 04/15/2019.     Goals Addressed            This Visit's Progress   . [id number] Trulicity assistance (pt-stated)       Current Barriers:  . financial  Pharmacist Clinical Goal(s): Over the next 14 days, Mr.. Allender will provide the necessary supplementary documents (proof of out of pocket prescription expenditure, proof of household income) needed for medication assistance applications to CCM pharmacist.   Interventions: . CCM  pharmacist will apply for medication assistance program for Trulicity made by [employer].   Patient Self Care Activities:  [name] Gather necessary documents needed to apply for medication assistance  Initial goal documentation        Plan Care coordination appointment to submit completed application scheduled for 2 weeks or sooner pending received application  [name], PharmD Clinical Pharmacist [facility] [phone]

## 2019-04-15 NOTE — Patient Instructions (Signed)
Goals Addressed            This Visit's Progress   . 123XX123 Trulicity assistance (pt-stated)       Current Barriers:  . financial  Pharmacist Clinical Goal(s): Over the next 14 days, Evan Rivas will provide the necessary supplementary documents (proof of out of pocket prescription expenditure, proof of household income) needed for medication assistance applications to CCM pharmacist.   Interventions: . CCM pharmacist will apply for medication assistance program for Trulicity made by [employer].   Patient Self Care Activities:  [name] Gather necessary documents needed to apply for medication assistance  Initial goal documentation

## 2019-04-29 ENCOUNTER — Ambulatory Visit: Payer: Self-pay | Admitting: Pharmacist

## 2019-04-29 DIAGNOSIS — E669 Obesity, unspecified: Secondary | ICD-10-CM

## 2019-04-29 DIAGNOSIS — N1832 Chronic kidney disease, stage 3b: Secondary | ICD-10-CM

## 2019-04-29 DIAGNOSIS — E1169 Type 2 diabetes mellitus with other specified complication: Secondary | ICD-10-CM

## 2019-04-29 NOTE — Chronic Care Management (AMB) (Signed)
  Chronic Care Management   Care Coordination Note  04/29/2019 Name: Evan Rivas MRN: [id number] DOB: 09-27-1950  Care coordination: submitted completed application to [facility], uploaded under media tab.   Goals Addressed            This Visit's Progress   . 123XX123 Trulicity assistance (pt-stated)       Current Barriers:  . financial  Pharmacist Clinical Goal(s): Over the next 14 days, Evan Rivas will provide the necessary supplementary documents (proof of out of pocket prescription expenditure, proof of household income) needed for medication assistance applications to CCM pharmacist.   Interventions: . CCM pharmacist will apply for medication assistance program for Trulicity made by [employer]. [name] Updated [id number]: completed application received, submitted to [facility]   Patient Self Care Activities:  [name] Gather necessary documents needed to apply for medication assistance  Please see past updates related to this goal by clicking on the "Past Updates" button in the selected goal           Follow up plan: Telephone follow up appointment with care management team member scheduled for: [id number] days for application status  [name], PharmD Clinical Pharmacist [place] [phone]   [facility]

## 2019-04-29 NOTE — Patient Instructions (Signed)
Goals Addressed            This Visit's Progress   . 123XX123 Trulicity assistance (pt-stated)       Current Barriers:  . financial  Pharmacist Clinical Goal(s): Over the next 14 days, Evan Rivas will provide the necessary supplementary documents (proof of out of pocket prescription expenditure, proof of household income) needed for medication assistance applications to CCM pharmacist.   Interventions: . CCM pharmacist will apply for medication assistance program for Trulicity made by [employer]. [name] Updated [id number]: completed application received, submitted to [facility]   Patient Self Care Activities:  [name] Gather necessary documents needed to apply for medication assistance  Please see past updates related to this goal by clicking on the "Past Updates" button in the selected goal

## 2019-05-09 ENCOUNTER — Ambulatory Visit: Payer: Self-pay | Admitting: Pharmacist

## 2019-05-09 DIAGNOSIS — E669 Obesity, unspecified: Secondary | ICD-10-CM

## 2019-05-09 DIAGNOSIS — E1169 Type 2 diabetes mellitus with other specified complication: Secondary | ICD-10-CM

## 2019-05-09 NOTE — Chronic Care Management (AMB) (Signed)
  Chronic Care Management   Care Coordination Note  05/09/2019 Name: Evan Rivas MRN: [id number] DOB: 1950-07-06  Care Coordination: contacted [employer] to follow up on Trulicity application; Financial records were not readable, refaxed copy to [place]            This Visit's Progress   . 123XX123 Trulicity assistance (pt-stated)       Current Barriers:  . financial  Pharmacist Clinical Goal(s): Over the next 14 days, [name] will provide the necessary supplementary documents (proof of out of pocket prescription expenditure, proof of household income) needed for medication assistance applications to CCM pharmacist.   Interventions: . CCM pharmacist will apply for medication assistance program for Trulicity made by [employer]. [name] Updated [id number]: completed application received, submitted to [place]  . Updated 2/5: contacted [employer] to check status of application; financial records were too dark to read via fax; lightened copy of Social Security benefit statement and refaxed to [employer].  Patient Self Care Activities:  [name] Gather necessary documents needed to apply for medication assistance  Please see past updates related to this goal by clicking on the "Past Updates" button in the selected goal          Follow up plan: Telephone follow up appointment with care management team member scheduled for: 5 days to follow up on application status  [name], PharmD Clinical Pharmacist [place] [phone]

## 2019-05-28 ENCOUNTER — Ambulatory Visit: Payer: Self-pay | Admitting: Pharmacist

## 2019-05-28 DIAGNOSIS — E669 Obesity, unspecified: Secondary | ICD-10-CM | POA: Diagnosis not present

## 2019-05-28 DIAGNOSIS — E1169 Type 2 diabetes mellitus with other specified complication: Secondary | ICD-10-CM

## 2019-05-28 NOTE — Chronic Care Management (AMB) (Signed)
  Chronic Care Management   Follow Up Note   05/28/2019 Name: Evan Rivas MRN: [id number] DOB: June 30, 1950  Subjective Evan Rivas is a 69 y.o. year old male who is a primary care patient of Hubbard Hartshorn, FNP. Successful telephone outreach to patient today, HIPAA identifiers verified.  Medication Assistance: Patient is has been approved for Trulicity made by [employer] and prescribed by [name]. Approved 05/21/18 and expires 04/02/20. Patient received shipment of 4 months of Trulicity 0000000. He did not have any gaps in his Trulicity pharmacotherapy while waiting for approval.   Assessment  Goals Addressed            This Visit's Progress   . COMPLETED: 123XX123 Trulicity assistance (pt-stated)       Current Barriers:  . financial  Pharmacist Clinical Goal(s): Over the next 14 days, Mr.. Frappier will provide the necessary supplementary documents (proof of out of pocket prescription expenditure, proof of household income) needed for medication assistance applications to CCM pharmacist.   Interventions: . CCM pharmacist will apply for medication assistance program for Trulicity made by [employer]. [name] Updated [id number]: completed application received, submitted to [place]  . Updated 2/5: contacted [employer] to check status of application; financial records were too dark to read via fax; lightened copy of Social Security benefit statement and refaxed to [employer] . Updated 123XX123: Trulicity approved 0000000, shipped 2/22, and received by patient 2/23; approved until 04/02/20  Patient Self Care Activities:  [name] Gather necessary documents needed to apply for medication assistance  Please see past updates related to this goal by clicking on the "Past Updates" button in the selected goal          Plan:   The patient has been provided with contact information for the care management team and has been advised to call with any health related questions or concerns.    [name],  PharmD Clinical Pharmacist [facility] [phone]

## 2019-05-28 NOTE — Patient Instructions (Signed)
Congratulations! You have met all case management goals! You may call the case management team at any time should you have a question or if you have new case management needs. We are happy to help you! We will let your doctor know that you have met your goals.    Thank you allowing the Chronic Care Management Team to be a part of your care!    Goals Addressed            This Visit's Progress   . COMPLETED: 0262 Trulicity assistance (pt-stated)       Current Barriers:  . financial  Pharmacist Clinical Goal(s): Over the next 14 days, Mr.. Brar will provide the necessary supplementary documents (proof of out of pocket prescription expenditure, proof of household income) needed for medication assistance applications to CCM pharmacist.   Interventions: . CCM pharmacist will apply for medication assistance program for Trulicity made by [employer]. [name] Updated 8/54: completed application received, submitted to [place]  . Updated 2/5: contacted [employer] to check status of application; financial records were too dark to read via fax; lightened copy of Social Security benefit statement and refaxed to [employer] . Updated 9/65: Trulicity approved 6/59, shipped 2/22, and received by patient 2/23; approved until 04/02/20  Patient Self Care Activities:  [name] Gather necessary documents needed to apply for medication assistance  Please see past updates related to this goal by clicking on the "Past Updates" button in the selected goal          Please call a member of the CCM (Chronic Care Management) Team with any questions or case management needs in the future:   Neldon Labella, RN, BSN Nurse Care Coordinator  [phone]  [name], PharmD  Clinical Pharmacist  [phone]  [place], [place] [employer] [phone]

## 2019-06-05 ENCOUNTER — Ambulatory Visit: Payer: Medicare PPO | Attending: Internal Medicine

## 2019-06-05 DIAGNOSIS — Z23 Encounter for immunization: Secondary | ICD-10-CM | POA: Insufficient documentation

## 2019-06-05 NOTE — Progress Notes (Signed)
   Covid-19 Vaccination Clinic  Name:  Evan Rivas    MRN: [id number] DOB: 11-Aug-1950  06/05/2019  Evan Rivas was observed post Covid-19 immunization for 15 minutes without incident. He was provided with Vaccine Information Sheet and instruction to access the V-Safe system.   Evan Rivas was instructed to call 911 with any severe reactions post vaccine: [name] Difficulty breathing  . Swelling of face and throat  . A fast heartbeat  . A bad rash all over body  . Dizziness and weakness

## 2019-06-13 NOTE — Progress Notes (Signed)
This encounter was created in error - please disregard.

## 2019-06-26 ENCOUNTER — Ambulatory Visit: Payer: Medicare PPO | Attending: Internal Medicine

## 2019-06-26 ENCOUNTER — Ambulatory Visit: Payer: Medicare PPO | Admitting: Physician Assistant

## 2019-06-26 DIAGNOSIS — Z23 Encounter for immunization: Secondary | ICD-10-CM

## 2019-06-26 NOTE — Progress Notes (Signed)
   Covid-19 Vaccination Clinic  Name:  Evan Rivas    MRN: [id number] DOB: 1950/08/25  06/26/2019  Evan Rivas was observed post Covid-19 immunization for 15 minutes without incident. He was provided with Vaccine Information Sheet and instruction to access the V-Safe system.   Evan Rivas was instructed to call 911 with any severe reactions post vaccine: [name] Difficulty breathing  . Swelling of face and throat  . A fast heartbeat  . A bad rash all over body  . Dizziness and weakness   Immunizations Administered    Name Date Dose VIS Date Route   Pfizer COVID-19 Vaccine 06/26/2019 10:08 AM 0.3 mL 03/14/2019 Intramuscular   Manufacturer: [place]   Lot: [id number]   [place]: [id number]

## 2019-06-27 ENCOUNTER — Other Ambulatory Visit: Payer: Self-pay

## 2019-06-27 ENCOUNTER — Encounter: Payer: Self-pay | Admitting: Family Medicine

## 2019-06-27 ENCOUNTER — Ambulatory Visit: Payer: Medicare PPO | Admitting: Family Medicine

## 2019-06-27 VITALS — BP 138/82 | HR 129 | Temp 97.4°F | Resp 16 | Ht 74.5 in | Wt 279.4 lb

## 2019-06-27 DIAGNOSIS — F419 Anxiety disorder, unspecified: Secondary | ICD-10-CM

## 2019-06-27 DIAGNOSIS — M5136 Other intervertebral disc degeneration, lumbar region: Secondary | ICD-10-CM | POA: Diagnosis not present

## 2019-06-27 DIAGNOSIS — I10 Essential (primary) hypertension: Secondary | ICD-10-CM

## 2019-06-27 DIAGNOSIS — E269 Hyperaldosteronism, unspecified: Secondary | ICD-10-CM | POA: Diagnosis not present

## 2019-06-27 DIAGNOSIS — M51369 Other intervertebral disc degeneration, lumbar region without mention of lumbar back pain or lower extremity pain: Secondary | ICD-10-CM

## 2019-06-27 DIAGNOSIS — E119 Type 2 diabetes mellitus without complications: Secondary | ICD-10-CM

## 2019-06-27 DIAGNOSIS — Z79899 Other long term (current) drug therapy: Secondary | ICD-10-CM

## 2019-06-27 DIAGNOSIS — E1169 Type 2 diabetes mellitus with other specified complication: Secondary | ICD-10-CM

## 2019-06-27 DIAGNOSIS — R600 Localized edema: Secondary | ICD-10-CM

## 2019-06-27 DIAGNOSIS — N1832 Chronic kidney disease, stage 3b: Secondary | ICD-10-CM

## 2019-06-27 DIAGNOSIS — E669 Obesity, unspecified: Secondary | ICD-10-CM

## 2019-06-27 DIAGNOSIS — F1120 Opioid dependence, uncomplicated: Secondary | ICD-10-CM | POA: Diagnosis not present

## 2019-06-27 DIAGNOSIS — F331 Major depressive disorder, recurrent, moderate: Secondary | ICD-10-CM

## 2019-06-27 DIAGNOSIS — I700 Atherosclerosis of aorta: Secondary | ICD-10-CM

## 2019-06-27 MED ORDER — HYDROCODONE-ACETAMINOPHEN 10-325 MG PO TABS: ORAL_TABLET | ORAL | 0 refills | Status: AC

## 2019-06-27 MED ORDER — HYDROCODONE-ACETAMINOPHEN 10-325 MG PO TABS: 1.0000 | ORAL_TABLET | Freq: Three times a day (TID) | ORAL | 0 refills | Status: AC

## 2019-06-27 MED ORDER — DULOXETINE HCL 60 MG PO CPEP: 60.0000 mg | ORAL_CAPSULE | Freq: Every day | ORAL | 0 refills | Status: DC

## 2019-06-27 MED ORDER — TIZANIDINE HCL 4 MG PO TABS: 4.0000 mg | ORAL_TABLET | Freq: Three times a day (TID) | ORAL | 3 refills | Status: DC | PRN

## 2019-06-27 MED ORDER — VALSARTAN-HYDROCHLOROTHIAZIDE 320-12.5 MG PO TABS: 1.0000 | ORAL_TABLET | Freq: Every day | ORAL | 0 refills | Status: DC

## 2019-06-27 MED ORDER — TRULICITY 1.5 MG/0.5ML ~~LOC~~ SOAJ: 1.5000 mg | SUBCUTANEOUS | 3 refills | Status: DC

## 2019-06-27 NOTE — Progress Notes (Signed)
Name: Evan Rivas   MRN: [id number]    DOB: 03/13/1951   Date:06/27/2019       Progress Note  Subjective  Chief Complaint  Chief Complaint  Patient presents with  . Follow-up  . Diabetes  . Hyperaldosteronism    Sees [name]  . Hypertension    Denies any symptoms  . Depression and anxiety  . Daytime Sleepiness/OSA  . Pain    HPI   Chronic Pain Management: -DDD/chronic back pain: He has never had any spinal surgeries; Dr. Dot Been management)did not do any procedures with Evan Rivas as Evan Rivas declined.  Dr. [name] requested that the patient's narcotic pain medication Rx be transferred to our clinic to help with financial burden of specialty visits for the patient, therefore Evan Rivas has been receiving his pain management medication from our clinic instead. At this time, we will maintain Rx's with our clinic - due for drug screening and controlled substance contract renewal. - Medications: Taking  Tizanidine,   Norco - Taking 10-325mg  TID with 1/2 tablet for 1 dose 3 days a week.  Gabapentin 300mg  TID. Discussed Narcan auto-injector- has at home and knows how to use it.  His pain at this time is 4/10 across his lumbar spine, occasionally goes down to right outer hip.He also has some bilaterally knee pain - s/p bilateral knee replacement for OA in 2018   - Controlled Substance Database is checked and no suspicious findings.  Pt has been consistently very compliant.  Hyperaldosteronism:He saw endocrinology lastinJuly; per [name] notes, it is not thought to be cancer related, but more likely related to adrenal adenoma; however not a surgical candidate at this time. Seeing Cardiology for HTN management - taking spironolactone. No longer planning to see [name]. Most recent kidney function was stable, electrolytes WN, he sees nephrologist . CKI stage III   DM:Was referred to endocrinology for DM,but was sent back to me as hislast A1C was at goal  range. He is taking trulicity, metformin 2000mg XRBID, and still taking glipizide 10mg  once dailywithout issues. Denies polydipsia, polyphagia, polyuria.  Eye exam is up to date, urine micro also checked recently by nephrologist .  On ARB and statin.   CKD Stage 3: He had decreased GFR  He is taking ARB for BP and protection.No itching, decrease in urine output   HTN: - on Spironolactone - sees Dr. [name] . BP at home has been averaging 140/80's. It can be from 1teens/150's, discussed considering changing from Losartan to Valsartan , we will also decrease dose of HCTZ since he has hypokalemia due to hyperaldosteronism  - Taking medications as prescribed,denies any missed doses. Losartan 100mg , Metoprolol 50mg  XL, spironolactone 50mg  daily, he is off norvasc and bp has been trending up -taking medications as instructed, no medication side effects noted, no TIAs, no chest pain on exertion, no dyspnea on exertion, he is planning on going back to the gym since he got his second covid 19 vaccine  Obesity: Weight Management History:difficulty managing weight even when he follows low carb diet and exercises due to COVID-19 pandemic, but plans on going back to the gym.  He is avoiding potato chips, but still eats some carbs like rice, pasta , potatoes   BLE Edema/Diastolic Dysfunction/HLD:Seeing Dr. [name] and [place] PA-C for management now -on spironolactone, metoprolol XL 50mg .  Swelling has improved since stopping amlodipine back in Dec, but bp is trending up, we will change ARB from losartan to diovan, on statin therapy, wearing compression  stockings daily without issue.  Major Depression and Anxiety:She was on Prozac but since January switched to duloxetine , only on 20 mg and we will increase dose to help with his pain and mood . Taking care of his grandchildren and is stressed out  Daytime Sleepiness/OSA:still dealing with daytime drowsinesswhich has not really changed much.Using CPAP  nightly now for sleep apneafor about 5.5 hours a night -started on 04/25/18.  Returning to pulmonology has been cost prohibitive for him. Unchanged  [id number] to have no issues with nocturia.Doing well on flomax without issue. Unchanged   Gout:D/C allopurinol due to recent kidney function decrease and no recent gout flares  Tachycardia:He had his second covid -19 vaccine yesterday he is feeling well, denies palpitation or SOB but heart rate was up at home this morning and also here in the office, he will see cardiologist next week   Patient Active Problem List   Diagnosis Date Noted  . CKD stage G3b/A2, GFR 30-44 and albumin creatinine ratio 30-299 mg/g 04/09/2019  . Hyperaldosteronism ([place]) 06/24/2018  . Diastolic dysfunction 0000000  . OSA (obstructive sleep apnea) 06/24/2018  . Excessive daytime sleepiness 06/24/2018  . Aortic atherosclerosis ([place]) 06/17/2018  . Leg edema 12/18/2017  . BPH without urinary obstruction 06/03/2015  . Depression 03/05/2015  . Hyperlipidemia 09/03/2014  . Major depressive disorder, recurrent episode, moderate ([place]) 09/03/2014  . Chronic low back pain 09/03/2014  . Arthralgia of both knees 09/03/2014  . Type 2 diabetes mellitus without complication ([place]) 123456  . Allergic rhinitis 07/04/2014  . Anxiety 07/04/2014  . Continuous opioid dependence ([place]) 07/04/2014  . Diabetes mellitus type 2 in obese ([place]) 07/04/2014  . DDD (degenerative disc disease), lumbar 07/04/2014  . Gout 07/04/2014  . Essential hypertension 07/04/2014  . Flu vaccine need 07/04/2014  . Morbid obesity ([place]) 07/04/2014  . OA (osteoarthritis) of knee 07/04/2014  . Neuralgia neuritis, sciatic nerve 07/04/2014  . Arthralgia of multiple joints 07/04/2014  . H/O malignant neoplasm of skin 12/24/2013    Past Surgical History:  Procedure Laterality Date  . ARTHROSCOPIC REPAIR ACL Left   . CYSTOSCOPY W/ URETERAL STENT PLACEMENT Right 09/07/2014   Procedure: CYSTOSCOPY  WITH STENT REPLACEMENT;  Surgeon: Hollice Espy, MD;  Location: [facility];  Service: Urology;  Laterality: Right;  . CYSTOSCOPY/URETEROSCOPY/HOLMIUM LASER/STENT PLACEMENT Right 08/17/2014   Procedure: CYSTOSCOPY/URETEROSCOPY//STENT PLACEMENT/ attempt of lithotripsy;  Surgeon: Hollice Espy, MD;  Location: [facility];  Service: Urology;  Laterality: Right;  . HERNIA REPAIR    . MOHS SURGERY     lip  . TOTAL KNEE ARTHROPLASTY Left 04/24/2016   Procedure: LEFT TOTAL KNEE ARTHROPLASTY;  Surgeon: Gaynelle Arabian, MD;  Location: [facility];  Service: Orthopedics;  Laterality: Left;  Adductor Block  . TOTAL KNEE ARTHROPLASTY Right 10/23/2016   Procedure: RIGHT TOTAL KNEE ARTHROPLASTY;  Surgeon: Gaynelle Arabian, MD;  Location: [facility];  Service: Orthopedics;  Laterality: Right;  . URETEROSCOPY WITH HOLMIUM LASER LITHOTRIPSY Right 09/07/2014   Procedure: URETEROSCOPY WITH HOLMIUM LASER LITHOTRIPSY;  Surgeon: Hollice Espy, MD;  Location: [facility];  Service: Urology;  Laterality: Right;  [name] VASECTOMY      Family History  Problem Relation Age of Onset  . Hypertension Mother   . Cancer Mother   . Hyperlipidemia Mother   . Stroke Father   . Heart disease Father   . Depression Sister   . Hypertension Sister   . Hyperlipidemia Sister     Social History   Tobacco Use  . Smoking status: Never  Smoker  . Smokeless tobacco: Never Used  Substance Use Topics  . Alcohol use: No     Current Outpatient Medications:  .  ASPIRIN 81 PO, Take 81 mg by mouth daily. , Disp: , Rfl:  .  atorvastatin (LIPITOR) 40 MG tablet, Take 1 tablet (40 mg total) by mouth at bedtime., Disp: 90 tablet, Rfl: 3 .  DULoxetine (CYMBALTA) 60 MG capsule, Take 1 capsule (60 mg total) by mouth daily., Disp: 90 capsule, Rfl: 0 .  fluticasone (FLONASE) 50 MCG/ACT nasal spray, Place 2 sprays into both nostrils daily. 2 sprays, Disp: 16 g, Rfl: 6 .  glipiZIDE (GLUCOTROL) 10 MG tablet, Take 1 tablet (10 mg total) by mouth daily before breakfast.,  Disp: 90 tablet, Rfl: 3 .  glucose blood (ACCU-CHEK AVIVA PLUS) test strip, Use as instructed, Disp: 100 each, Rfl: 12 .  HYDROcodone-acetaminophen (NORCO) 10-325 MG tablet, Take 1 tab BID; For 3rd daily dose: Take 1/2 tab Mon/Wed/Fri and take 1 tab Sun/Tues/Thurs/Sat, Disp: 87 tablet, Rfl: 0 .  hydrOXYzine (ATARAX/VISTARIL) 10 MG tablet, Take 1 tablet (10 mg total) by mouth every 8 (eight) hours as needed for anxiety., Disp: 30 tablet, Rfl: 1 .  metFORMIN (GLUCOPHAGE) 1000 MG tablet, Take 1 tablet (1,000 mg total) by mouth 2 (two) times daily with a meal., Disp: 180 tablet, Rfl: 3 .  spironolactone (ALDACTONE) 25 MG tablet, Take 2 tablets (50 mg) by mouth once daily, Disp: 270 tablet, Rfl: 3 .  tamsulosin (FLOMAX) 0.4 MG CAPS capsule, Take 1 capsule (0.4 mg total) by mouth daily after supper., Disp: 90 capsule, Rfl: 3 .  tiZANidine (ZANAFLEX) 4 MG tablet, Take 1 tablet (4 mg total) by mouth every 8 (eight) hours as needed for muscle spasms., Disp: 60 tablet, Rfl: 3 .  Dulaglutide (TRULICITY) 1.5 0000000 SOPN, Inject 1.5 mg into the skin once a week., Disp: 6 mL, Rfl: 3 .  gabapentin (NEURONTIN) 300 MG capsule, Take 1 capsule (300 mg total) by mouth 3 (three) times daily. Emergency Supply while mail order comes in., Disp: 270 capsule, Rfl: 3 .  HYDROcodone-acetaminophen (NORCO) 10-325 MG tablet, Take 1 tablet by mouth in the morning, at noon, and at bedtime for 5 days. Take 1 tab BID; For 3rd daily dose: Take 1/2 tab Mon/Wed/Fri and take 1 tab Sun/Tues/Thurs/Sat, Disp: 87 tablet, Rfl: 0 .  HYDROcodone-acetaminophen (NORCO) 10-325 MG tablet, Take 1 tablet by mouth in the morning, at noon, and at bedtime for 5 days. Take 1 tab BID; For 3rd daily dose: Take 1/2 tab Mon/Wed/Fri and take 1 tab Sun/Tues/Thurs/Sat, Disp: 87 tablet, Rfl: 0 .  metoprolol succinate (TOPROL-XL) 50 MG 24 hr tablet, Take 1 tablet (50 mg total) by mouth daily. Take with or immediately following a meal., Disp: 90 tablet, Rfl: 3 .   valsartan-hydrochlorothiazide (DIOVAN-HCT) 320-12.5 MG tablet, Take 1 tablet by mouth daily., Disp: 90 tablet, Rfl: 0  Allergies  Allergen Reactions  . Celebrex  [Celecoxib]     GI  . Tetanus Toxoids Other (See Comments)    unkown  . Tetanus-Diphtheria Toxoids Td     Other reaction(s): Unknown    I personally reviewed active problem list, medication list, allergies, family history, social history, health maintenance with the patient/caregiver today.   ROS  Constitutional: Negative for fever or weight change.  Respiratory: Negative for cough and shortness of breath.   Cardiovascular: Negative for chest pain or palpitations.  Gastrointestinal: Negative for abdominal pain, no bowel changes.  Musculoskeletal: Negative for gait problem  or joint swelling.  Skin: Negative for rash.  Neurological: Negative for dizziness or headache.  No other specific complaints in a complete review of systems (except as listed in HPI above).  Objective  Vitals:   06/27/19 0923  BP: 138/82  Pulse: (!) 129  Resp: 16  Temp: (!) 97.4 F (36.3 C)  TempSrc: Temporal  SpO2: 99%  Weight: 279 lb 6.4 oz (126.7 kg)  Height: 6' 2.5" (1.892 m)    Body mass index is 35.39 kg/m.  Physical Exam  Constitutional: Patient appears well-developed and well-nourished. Obese No distress.  HEENT: head atraumatic, normocephalic, pupils equal and reactive to light, strabismus right eye Cardiovascular: Normal rate, regular rhythm and normal heart sounds.  No murmur heard. No BLE edema. Pulmonary/Chest: Effort normal and breath sounds normal. No respiratory distress. Abdominal: Soft.  There is no tenderness. Muscular skeletal : pain during palpation of right lumbar spine, good flexion, pain with extension , decrease lateral bending and negative straight leg raise Psychiatric: Patient has a normal mood and affect. behavior is normal. Judgment and thought content normal.  Recent Results (from the past 2160 hour(s))   Microalbumin / creatinine urine ratio     Status: Abnormal   Collection Time: 04/07/19 10:58 AM  Result Value Ref Range   Creatinine, Urine 165 20 - 320 mg/dL   Microalb, Ur 10.9 mg/dL    Comment: Reference Range Not established    Microalb Creat Ratio 66 (H) <30 mcg/mg creat    Comment: . The ADA defines abnormalities in albumin excretion as follows: [name] Category         Result (mcg/mg creatinine) . Normal                    <30 Microalbuminuria         30-299  Clinical albuminuria   > OR = 300 . The ADA recommends that at least two of three specimens collected within a 3-6 month period be abnormal before considering a patient to be within a diagnostic category.   COMPLETE METABOLIC PANEL WITH GFR     Status: Abnormal   Collection Time: 04/07/19 10:58 AM  Result Value Ref Range   Glucose, Bld 204 (H) 65 - 99 mg/dL    Comment: .            Fasting reference interval . For someone without known diabetes, a glucose value >125 mg/dL indicates that they may have diabetes and this should be confirmed with a follow-up test. .    BUN 34 (H) 7 - 25 mg/dL   Creat 1.59 (H) 0.70 - 1.25 mg/dL    Comment: For patients >74 years of age, the reference limit for Creatinine is approximately 13% higher for people identified as African-American. .    GFR, Est Non African American 44 (L) > OR = 60 mL/min/1.37m2   GFR, Est African American 51 (L) > OR = 60 mL/min/1.26m2   BUN/Creatinine Ratio 21 6 - 22 (calc)   Sodium 142 135 - 146 mmol/L   Potassium 4.8 3.5 - 5.3 mmol/L   Chloride 107 98 - 110 mmol/L   CO2 27 20 - 32 mmol/L   Calcium 9.4 8.6 - 10.3 mg/dL   Total Protein 6.6 6.1 - 8.1 g/dL   Albumin 3.9 3.6 - 5.1 g/dL   Globulin 2.7 1.9 - 3.7 g/dL (calc)   AG Ratio 1.4 1.0 - 2.5 (calc)   Total Bilirubin 0.3 0.2 - 1.2 mg/dL   Alkaline phosphatase (  APISO) 78 35 - 144 U/L   AST 14 10 - 35 U/L   ALT 24 9 - 46 U/L  Hemoglobin A1c     Status: Abnormal   Collection Time: 04/07/19  10:58 AM  Result Value Ref Range   Hgb A1c MFr Bld 7.3 (H) <5.7 % of total Hgb    Comment: For someone without known diabetes, a hemoglobin A1c value of 6.5% or greater indicates that they may have  diabetes and this should be confirmed with a follow-up  test. . For someone with known diabetes, a value <7% indicates  that their diabetes is well controlled and a value  greater than or equal to 7% indicates suboptimal  control. A1c targets should be individualized based on  duration of diabetes, age, comorbid conditions, and  other considerations. . Currently, no consensus exists regarding use of hemoglobin A1c for diagnosis of diabetes for children. .    Mean Plasma Glucose 163 (calc)   eAG (mmol/L) 9.0 (calc)  PSA     Status: None   Collection Time: 04/07/19 10:58 AM  Result Value Ref Range   PSA 0.6 < OR = 4.0 ng/mL    Comment: The total PSA value from this assay system is  standardized against the WHO standard. The test  result will be approximately 20% lower when compared  to the equimolar-standardized total PSA (Beckman  Coulter). Comparison of serial PSA results should be  interpreted with this fact in mind. . This test was performed using the Siemens  chemiluminescent method. Values obtained from  different assay methods cannot be used interchangeably. PSA levels, regardless of value, should not be interpreted as absolute evidence of the presence or absence of disease.   Pain Mgmt, Profile 1 Conf w/o mM, U     Status: None   Collection Time: 04/07/19 10:58 AM  Result Value Ref Range   Creatinine 161.7 mg/dL   pH 5.5 4.5 - 9.0   Oxidant NEGATIVE mcg/mL   Amphetamines NEGATIVE ng/mL   Barbiturates NEGATIVE ng/mL   Benzodiazepines NEGATIVE ng/mL   Marijuana Metabolite NEGATIVE ng/mL   Cocaine Metabolite NEGATIVE ng/mL   Methadone Metabolite NEGATIVE ng/mL   Opiates POSITIVE ng/mL   Codeine NEGATIVE ng/mL    Comment: See Note 1   Hydrocodone 1,836 ng/mL     Comment: See Note 1   Hydromorphone 147 ng/mL    Comment: See Note 1   Morphine NEGATIVE ng/mL    Comment: See Note 1   Norhydrocodone 4,496 ng/mL    Comment: See Note 1   Oxycodone NEGATIVE ng/mL   Phencyclidine NEGATIVE ng/mL    Comment: See Note 2 . Note 1 . This test was developed and its analytical performance  characteristics have been determined by [employer]. It has not been cleared or approved by the FDA. This assay has been validated pursuant to the CLIA  regulations and is used for clinical purposes. . Note 2 This drug testing is for medical treatment only.   Analysis was performed as non-forensic testing and  these results should be used only by healthcare  providers to render diagnosis or treatment, or to  monitor progress of medical conditions. . For assistance with interpreting these drug results,  please contact a [employer] Toxicology  Specialist: [phone] [place] [phone]), M-F,  8am-6pm EST.      PHQ2/9: Depression screen [facility] 2/9 06/27/2019 04/03/2019 01/01/2019 09/24/2018 09/24/2018  Decreased Interest 1 0 0 0 0  Down, Depressed, Hopeless 1 0  1 1 1   PHQ - 2 Score 2 0 1 1 1   Altered sleeping 2 0 0 0 0  Tired, decreased energy 2 0 1 0 0  Change in appetite 1 0 0 0 0  Feeling bad or failure about yourself  0 0 0 0 0  Trouble concentrating 0 0 0 0 0  Moving slowly or fidgety/restless 0 0 0 0 0  Suicidal thoughts 0 0 0 0 0  PHQ-9 Score 7 0 2 1 1   Difficult doing work/chores Somewhat difficult Not difficult at all Somewhat difficult - Not difficult at all  Some recent data might be hidden    phq 9 is positive  Fall Risk: Fall Risk  06/27/2019 04/03/2019 01/01/2019 09/24/2018 09/24/2018  Falls in the past year? 0 0 0 1 0  Number falls in past yr: 0 0 0 1 0  Injury with Fall? 0 0 0 1 0  Risk for fall due to : - - - History of fall(s) -  Follow up - Falls evaluation completed - Falls prevention discussed -     Functional Status  Survey: Is the patient deaf or have difficulty hearing?: No Does the patient have difficulty seeing, even when wearing glasses/contacts?: No Does the patient have difficulty concentrating, remembering, or making decisions?: No Does the patient have difficulty walking or climbing stairs?: No Does the patient have difficulty dressing or bathing?: No Does the patient have difficulty doing errands alone such as visiting a doctor's office or shopping?: No    Assessment & Plan  1. DDD (degenerative disc disease), lumbar  - tiZANidine (ZANAFLEX) 4 MG tablet; Take 1 tablet (4 mg total) by mouth every 8 (eight) hours as needed for muscle spasms.  Dispense: 60 tablet; Refill: 3 - HYDROcodone-acetaminophen (NORCO) 10-325 MG tablet; Take 1 tab BID; For 3rd daily dose: Take 1/2 tab Mon/Wed/Fri and take 1 tab Sun/Tues/Thurs/Sat  Dispense: 87 tablet; Refill: 0 - HYDROcodone-acetaminophen (NORCO) 10-325 MG tablet; Take 1 tablet by mouth in the morning, at noon, and at bedtime for 5 days. Take 1 tab BID; For 3rd daily dose: Take 1/2 tab Mon/Wed/Fri and take 1 tab Sun/Tues/Thurs/Sat  Dispense: 87 tablet; Refill: 0 - HYDROcodone-acetaminophen (NORCO) 10-325 MG tablet; Take 1 tablet by mouth in the morning, at noon, and at bedtime for 5 days. Take 1 tab BID; For 3rd daily dose: Take 1/2 tab Mon/Wed/Fri and take 1 tab Sun/Tues/Thurs/Sat  Dispense: 87 tablet; Refill: 0 - Dulaglutide (TRULICITY) 1.5 0000000 SOPN; Inject 1.5 mg into the skin once a week.  Dispense: 6 mL; Refill: 3  2. Continuous opioid dependence (HCC)  - HYDROcodone-acetaminophen (NORCO) 10-325 MG tablet; Take 1 tab BID; For 3rd daily dose: Take 1/2 tab Mon/Wed/Fri and take 1 tab Sun/Tues/Thurs/Sat  Dispense: 87 tablet; Refill: 0 - HYDROcodone-acetaminophen (NORCO) 10-325 MG tablet; Take 1 tablet by mouth in the morning, at noon, and at bedtime for 5 days. Take 1 tab BID; For 3rd daily dose: Take 1/2 tab Mon/Wed/Fri and take 1 tab  Sun/Tues/Thurs/Sat  Dispense: 87 tablet; Refill: 0 - HYDROcodone-acetaminophen (NORCO) 10-325 MG tablet; Take 1 tablet by mouth in the morning, at noon, and at bedtime for 5 days. Take 1 tab BID; For 3rd daily dose: Take 1/2 tab Mon/Wed/Fri and take 1 tab Sun/Tues/Thurs/Sat  Dispense: 87 tablet; Refill: 0 - Dulaglutide (TRULICITY) 1.5 0000000 SOPN; Inject 1.5 mg into the skin once a week.  Dispense: 6 mL; Refill: 3  3. Controlled substance agreement signed  - HYDROcodone-acetaminophen (  NORCO) 10-325 MG tablet; Take 1 tab BID; For 3rd daily dose: Take 1/2 tab Mon/Wed/Fri and take 1 tab Sun/Tues/Thurs/Sat  Dispense: 87 tablet; Refill: 0  4. CKD stage G3b/A2, GFR 30-44 and albumin creatinine ratio 30-299 mg/g   5. Morbid obesity ([place])  Discussed with the patient the risk posed by an increased BMI. Discussed importance of portion control, calorie counting and at least 150 minutes of physical activity weekly. Avoid sweet beverages and drink more water. Eat at least 6 servings of fruit and vegetables daily   6. Diabetes mellitus type 2 in obese ([place])   7. Aortic atherosclerosis ([place])  On statin therapy   8. Bilateral lower extremity edema  Doing better since off norvasc  9. Major depressive disorder, recurrent episode, moderate (HCC)  - DULoxetine (CYMBALTA) 60 MG capsule; Take 1 capsule (60 mg total) by mouth daily.  Dispense: 90 capsule; Refill: 0  10. Hyperaldosteronism ([place])   11. Essential hypertension  - valsartan-hydrochlorothiazide (DIOVAN-HCT) 320-12.5 MG tablet; Take 1 tablet by mouth daily.  Dispense: 90 tablet; Refill: 0  12. Anxiety  - DULoxetine (CYMBALTA) 60 MG capsule; Take 1 capsule (60 mg total) by mouth daily.  Dispense: 90 capsule; Refill: 0  13. Type 2 diabetes mellitus without complication, without long-term current use of insulin ([place])

## 2019-06-27 NOTE — Patient Instructions (Signed)
New rx for bp is called valsartan hctz and it will replace losartan 100 mg and HCTZ 25 mg You will also take a higher dose of duloxetine going from 20 mg ( take two until out ) to a rx of 60 mg daily that will help with mood and pain

## 2019-06-30 NOTE — Progress Notes (Signed)
Office Visit    Patient Name: Evan Rivas Date of Encounter: 07/01/2019  Primary Care Provider:  Hubbard Hartshorn, FNP Primary Cardiologist:  Nelva Bush, MD Electrophysiologist:  None   Chief Complaint    Evan Rivas is a 69 y.o. 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 Anxiety    panic attacks  . Arthritis   . Cancer (HCC)    lip  . Depression   . Diabetes mellitus without complication ([place])    type 2  . GERD (gastroesophageal reflux disease)   . Gout   . History of kidney stones   . Hyperaldosteronism ([place])    bilateral adrenal hyperplasia  . Hyperlipidemia   . Hypertension   . Pneumonia    2014 and 2018 ( 2018-klebsiella pneumonia    Past Surgical History:  Procedure Laterality Date  . ARTHROSCOPIC REPAIR ACL Left   . CYSTOSCOPY W/ URETERAL STENT PLACEMENT Right 09/07/2014   Procedure: CYSTOSCOPY WITH STENT REPLACEMENT;  Surgeon: Hollice Espy, MD;  Location: [facility];  Service: Urology;  Laterality: Right;  . CYSTOSCOPY/URETEROSCOPY/HOLMIUM LASER/STENT PLACEMENT Right 08/17/2014   Procedure: CYSTOSCOPY/URETEROSCOPY//STENT PLACEMENT/ attempt of lithotripsy;  Surgeon: Hollice Espy, MD;  Location: [facility];  Service: Urology;  Laterality: Right;  . HERNIA REPAIR    . MOHS SURGERY     lip  . TOTAL KNEE ARTHROPLASTY Left 04/24/2016   Procedure: LEFT TOTAL KNEE ARTHROPLASTY;  Surgeon: Gaynelle Arabian, MD;  Location: [facility];  Service: Orthopedics;  Laterality: Left;  Adductor Block  . TOTAL KNEE ARTHROPLASTY Right 10/23/2016   Procedure: RIGHT TOTAL KNEE ARTHROPLASTY;  Surgeon: Gaynelle Arabian, MD;  Location: [facility];  Service: Orthopedics;  Laterality: Right;  . URETEROSCOPY WITH HOLMIUM LASER LITHOTRIPSY Right  09/07/2014   Procedure: URETEROSCOPY WITH HOLMIUM LASER LITHOTRIPSY;  Surgeon: Hollice Espy, MD;  Location: [facility];  Service: Urology;  Laterality: Right;  [name] VASECTOMY      Allergies  Allergies  Allergen Reactions  . Celebrex  [Celecoxib]     GI  . Tetanus Toxoids Other (See Comments)    unkown  . Tetanus-Diphtheria Toxoids Td     Other reaction(s): Unknown    History of Present Illness    Evan Rivas is a 69 y.o. male with a hx of HTN, diastolic dysfunction, hyperaldosteronism secondary to adrenal hyperplasia, HTN, HLD, DM2 cancer of the lip, anxiety, depression, gout, arthritis with chronic pain last seen 03/19/19 by Dr. [name].  In August his Spironolactone was increased due to LE edema and Metoproll decreased due to new first degree AV block. Subsequently, Spironolactone decreased due to bump in creatinine and potassium. At last office visit 03/19/19 his Amlodipine was well controlled as his blood pressure was well controlled and he wished to consolidate his medications.  Seen by PCP 06/27/19. His home BP was averaging 140s/80s. His Losartan was transitioned to Diovan.  He has not yet received this medication from [facility].  He does plan to start and will check his blood pressure at home after starting.  Enjoys spending time with his granddaughters.  He and his wife watch them during the daytime.  They range from 69 years old to 15 months.  They keep him very active.  Endorses eating a low-sodium, heart  healthy diet.  Tells me he has no shortness of breath at rest.  Reports stable exertional dyspnea.  Reports no chest pain, pressure, tightness.  His lower extremity edema is well controlled with utilization of compression stockings.  No orthopnea, PND.  EKGs/Labs/Other Studies Reviewed:   The following studies were reviewed today:  EKG:  EKG is ordered today.  The ekg ordered today demonstrates SR 91 bpm with no acute ST/T wave changes.  Recent Labs: 04/07/2019: ALT 24; BUN  34; Creat 1.59; Potassium 4.8; Sodium 142  Recent Lipid Panel    Component Value Date/Time   CHOL 147 06/28/2018 1019   CHOL 140 09/17/2017 1014   TRIG 305 (H) 06/28/2018 1019   HDL 32 (L) 06/28/2018 1019   HDL 32 (L) 09/17/2017 1014   CHOLHDL 4.6 06/28/2018 1019   LDLCALC 78 06/28/2018 1019    Home Medications   Current Meds  Medication Sig  . ASPIRIN 81 PO Take 81 mg by mouth daily.   [name] atorvastatin (LIPITOR) 40 MG tablet Take 1 tablet (40 mg total) by mouth at bedtime.  . Dulaglutide (TRULICITY) 1.5 0000000 SOPN Inject 1.5 mg into the skin once a week.  . DULoxetine (CYMBALTA) 60 MG capsule Take 1 capsule (60 mg total) by mouth daily. (Patient taking differently: Take 60 mg by mouth daily. Waiting on mail order-has not started yet)  . fluticasone (FLONASE) 50 MCG/ACT nasal spray Place 2 sprays into both nostrils daily. 2 sprays  . gabapentin (NEURONTIN) 300 MG capsule Take 1 capsule (300 mg total) by mouth 3 (three) times daily. Emergency Supply while mail order comes in.  [name] glipiZIDE (GLUCOTROL) 10 MG tablet Take 1 tablet (10 mg total) by mouth daily before breakfast.  . glucose blood (ACCU-CHEK AVIVA PLUS) test strip Use as instructed  . HYDROcodone-acetaminophen (NORCO) 10-325 MG tablet Take 1 tab BID; For 3rd daily dose: Take 1/2 tab Mon/Wed/Fri and take 1 tab Sun/Tues/Thurs/Sat  . HYDROcodone-acetaminophen (NORCO) 10-325 MG tablet Take 1 tablet by mouth in the morning, at noon, and at bedtime for 5 days. Take 1 tab BID; For 3rd daily dose: Take 1/2 tab Mon/Wed/Fri and take 1 tab Sun/Tues/Thurs/Sat  . hydrOXYzine (ATARAX/VISTARIL) 10 MG tablet Take 1 tablet (10 mg total) by mouth every 8 (eight) hours as needed for anxiety.  . metFORMIN (GLUCOPHAGE) 1000 MG tablet Take 1 tablet (1,000 mg total) by mouth 2 (two) times daily with a meal.  . metoprolol succinate (TOPROL-XL) 50 MG 24 hr tablet Take 1 tablet (50 mg total) by mouth daily. Take with or immediately following a meal.  .  spironolactone (ALDACTONE) 25 MG tablet Take 2 tablets (50 mg) by mouth once daily  . tamsulosin (FLOMAX) 0.4 MG CAPS capsule Take 1 capsule (0.4 mg total) by mouth daily after supper.  [name] tiZANidine (ZANAFLEX) 4 MG tablet Take 1 tablet (4 mg total) by mouth every 8 (eight) hours as needed for muscle spasms.  . valsartan-hydrochlorothiazide (DIOVAN-HCT) 320-12.5 MG tablet Take 1 tablet by mouth daily.    Review of Systems   Review of Systems  Constitution: Negative for chills, fever and malaise/fatigue.  Cardiovascular: Positive for dyspnea on exertion and leg swelling. Negative for chest pain, near-syncope, orthopnea, palpitations and syncope.  Respiratory: Negative for cough, shortness of breath and wheezing.   Gastrointestinal: Negative for nausea and vomiting.  Neurological: Negative for dizziness, light-headedness and weakness.   All other systems reviewed and are otherwise negative except as noted above.  Physical Exam  VS:  BP 140/90 (BP Location: Left Arm, Patient Position: Sitting, Cuff Size: Large)   Pulse 91   Ht 6' 2.5" (1.892 m)   Wt 282 lb (127.9 kg)   SpO2 95%   BMI 35.72 kg/m  , BMI Body mass index is 35.72 kg/m. GEN: Well nourished, overweight, well developed, in no acute distress. HEENT: normal. Neck: Supple, no JVD, carotid bruits, or masses. Cardiac: RRR, no murmurs, rubs, or gallops. No clubbing, cyanosis, edema.  Radials/DP/PT 2+ and equal bilaterally.  Respiratory:  Respirations regular and unlabored, clear to auscultation bilaterally. GI: Soft, nontender, nondistended, BS + x 4. MS: No deformity or atrophy. Skin: Warm and dry, no rash. Neuro:  Strength and sensation are intact. Psych: Normal affect.  Assessment & Plan    1. Secondary hypertension due to hyperaldosteronism -blood pressure elevated today 140/90.  His PCP changed his losartan to valsartan a few days ago and he has not yet been able to start this medication.  Agree with transition to  valsartan.  He will send report of blood pressures in 2 weeks.  Goal blood pressure less than 130/80.  Otherwise continue Toprol 50 mg daily, spironolactone 50 mg daily. 2. Leg edema - Euvolemic on exam.  Wears compression stockings daily.  Continue spironolactone 50 mg daily. 3. DM2 - A1c of 7.3 on 04/07/19.  Continue to follow with primary care. 4. CKD 3 -careful titration of diuretics and antihypertensive agents. 5. OSA - CPAP compliance encouraged.   Disposition: He was sent MyChart message to primary care provider or [place] in 2 weeks with report of blood pressures after starting valsartan.  Follow up in 4 month(s) with Dr. [name] or APP.    Loel Dubonnet, NP 07/01/2019, 9:46 AM

## 2019-07-01 ENCOUNTER — Other Ambulatory Visit: Payer: Self-pay

## 2019-07-01 ENCOUNTER — Ambulatory Visit: Payer: Medicare PPO | Admitting: Family

## 2019-07-01 ENCOUNTER — Encounter: Payer: Self-pay | Admitting: Family

## 2019-07-01 VITALS — BP 140/90 | HR 91 | Ht 74.5 in | Wt 282.0 lb

## 2019-07-01 DIAGNOSIS — R600 Localized edema: Secondary | ICD-10-CM | POA: Diagnosis not present

## 2019-07-01 DIAGNOSIS — Z79899 Other long term (current) drug therapy: Secondary | ICD-10-CM | POA: Diagnosis not present

## 2019-07-01 DIAGNOSIS — I10 Essential (primary) hypertension: Secondary | ICD-10-CM | POA: Diagnosis not present

## 2019-07-01 NOTE — Patient Instructions (Signed)
Medication Instructions:  No medication changes.  *If you need a refill on your cardiac medications before your next appointment, please call your pharmacy*  Lab Work: No lab work today.  If you have labs (blood work) drawn today and your tests are completely normal, you will receive your results only by: [name] MyChart Message (if you have MyChart) OR . A paper copy in the mail If you have any lab test that is abnormal or we need to change your treatment, we will call you to review the results.  Testing/Procedures: Your EKG today shows normal sinus rhythm.   Follow-Up: At [facility], you and your health needs are our priority.  As part of our continuing mission to provide you with exceptional heart care, we have created designated Provider Care Teams.  These Care Teams include your primary Cardiologist (physician) and Advanced Practice Providers (APPs -  Physician Assistants and Nurse Practitioners) who all work together to provide you with the care you need, when you need it.  We recommend signing up for the patient portal called "MyChart".  Sign up information is provided on this After Visit Summary.  MyChart is used to connect with patients for Virtual Visits (Telemedicine).  Patients are able to view lab/test results, encounter notes, upcoming appointments, etc.  Non-urgent messages can be sent to your provider as well.   To learn more about what you can do with MyChart, go to [email or web address].    Your next appointment:   4 month(s)  The format for your next appointment:   In Person  Provider:    You may see Nelva Bush, MD or one of the following Advanced Practice Providers on your designated Care Team:    Murray Hodgkins, NP  Christell Faith, PA-C  Marrianne Mood, PA-C  Other Instructions  Continue to check your blood pressure regularly. In about two weeks would send a report of your blood pressures in a couple of weeks via MyChart to either your primary care  provider or [place].

## 2019-09-03 ENCOUNTER — Telehealth: Payer: Self-pay | Admitting: Family Medicine

## 2019-09-03 NOTE — Chronic Care Management (AMB) (Signed)
  Chronic Care Management   Note  09/03/2019 Name: Evan Rivas MRN: [id number] DOB: [birth year]  Evan Rivas is a 69 y.o. year old male who is a primary care patient of Hubbard Hartshorn, FNP and is actively engaged with the care management team. I reached out to [name] by phone today to assist with scheduling an initial visit with the Pharmacist  Follow up plan: Telephone appointment with care management team member scheduled for:10/02/2019.  [place], [place] [id number] Direct Dial: [phone] [email or web address]: [email or web address]

## 2019-09-04 ENCOUNTER — Inpatient Hospital Stay
Admission: EM | Admit: 2019-09-04 | Discharge: 2019-09-08 | DRG: 871 | Disposition: A | Discharge status: Home / Self Care | Payer: Medicare PPO | Attending: Internal Medicine | Admitting: Internal Medicine

## 2019-09-04 ENCOUNTER — Emergency Department: Payer: Medicare PPO

## 2019-09-04 ENCOUNTER — Other Ambulatory Visit: Payer: Self-pay | Admitting: Emergency Medicine

## 2019-09-04 ENCOUNTER — Other Ambulatory Visit: Payer: Self-pay

## 2019-09-04 DIAGNOSIS — N35912 Unspecified bulbous urethral stricture, male: Secondary | ICD-10-CM | POA: Diagnosis present

## 2019-09-04 DIAGNOSIS — N1832 Chronic kidney disease, stage 3b: Secondary | ICD-10-CM | POA: Diagnosis not present

## 2019-09-04 DIAGNOSIS — J302 Other seasonal allergic rhinitis: Secondary | ICD-10-CM | POA: Diagnosis present

## 2019-09-04 DIAGNOSIS — E872 Acidosis: Secondary | ICD-10-CM | POA: Diagnosis present

## 2019-09-04 DIAGNOSIS — I700 Atherosclerosis of aorta: Secondary | ICD-10-CM | POA: Diagnosis present

## 2019-09-04 DIAGNOSIS — Z7982 Long term (current) use of aspirin: Secondary | ICD-10-CM

## 2019-09-04 DIAGNOSIS — Z96653 Presence of artificial knee joint, bilateral: Secondary | ICD-10-CM | POA: Diagnosis present

## 2019-09-04 DIAGNOSIS — A419 Sepsis, unspecified organism: Principal | ICD-10-CM | POA: Diagnosis present

## 2019-09-04 DIAGNOSIS — N200 Calculus of kidney: Secondary | ICD-10-CM | POA: Diagnosis present

## 2019-09-04 DIAGNOSIS — B9620 Unspecified Escherichia coli [E. coli] as the cause of diseases classified elsewhere: Secondary | ICD-10-CM | POA: Diagnosis present

## 2019-09-04 DIAGNOSIS — N171 Acute kidney failure with acute cortical necrosis: Secondary | ICD-10-CM

## 2019-09-04 DIAGNOSIS — F419 Anxiety disorder, unspecified: Secondary | ICD-10-CM | POA: Diagnosis present

## 2019-09-04 DIAGNOSIS — E1169 Type 2 diabetes mellitus with other specified complication: Secondary | ICD-10-CM | POA: Diagnosis not present

## 2019-09-04 DIAGNOSIS — Z79899 Other long term (current) drug therapy: Secondary | ICD-10-CM

## 2019-09-04 DIAGNOSIS — F32A Depression, unspecified: Secondary | ICD-10-CM | POA: Diagnosis present

## 2019-09-04 DIAGNOSIS — N12 Tubulo-interstitial nephritis, not specified as acute or chronic: Secondary | ICD-10-CM | POA: Diagnosis not present

## 2019-09-04 DIAGNOSIS — I10 Essential (primary) hypertension: Secondary | ICD-10-CM | POA: Diagnosis present

## 2019-09-04 DIAGNOSIS — E269 Hyperaldosteronism, unspecified: Secondary | ICD-10-CM | POA: Diagnosis present

## 2019-09-04 DIAGNOSIS — G4733 Obstructive sleep apnea (adult) (pediatric): Secondary | ICD-10-CM | POA: Diagnosis present

## 2019-09-04 DIAGNOSIS — Z6835 Body mass index (BMI) 35.0-35.9, adult: Secondary | ICD-10-CM

## 2019-09-04 DIAGNOSIS — Z809 Family history of malignant neoplasm, unspecified: Secondary | ICD-10-CM

## 2019-09-04 DIAGNOSIS — N10 Acute tubulo-interstitial nephritis: Secondary | ICD-10-CM | POA: Diagnosis present

## 2019-09-04 DIAGNOSIS — E1165 Type 2 diabetes mellitus with hyperglycemia: Secondary | ICD-10-CM | POA: Diagnosis present

## 2019-09-04 DIAGNOSIS — N1831 Chronic kidney disease, stage 3a: Secondary | ICD-10-CM | POA: Diagnosis present

## 2019-09-04 DIAGNOSIS — Z8249 Family history of ischemic heart disease and other diseases of the circulatory system: Secondary | ICD-10-CM

## 2019-09-04 DIAGNOSIS — Z7984 Long term (current) use of oral hypoglycemic drugs: Secondary | ICD-10-CM

## 2019-09-04 DIAGNOSIS — F329 Major depressive disorder, single episode, unspecified: Secondary | ICD-10-CM

## 2019-09-04 DIAGNOSIS — Z20822 Contact with and (suspected) exposure to covid-19: Secondary | ICD-10-CM | POA: Diagnosis present

## 2019-09-04 DIAGNOSIS — Z818 Family history of other mental and behavioral disorders: Secondary | ICD-10-CM

## 2019-09-04 DIAGNOSIS — E278 Other specified disorders of adrenal gland: Secondary | ICD-10-CM | POA: Diagnosis present

## 2019-09-04 DIAGNOSIS — E669 Obesity, unspecified: Secondary | ICD-10-CM | POA: Diagnosis present

## 2019-09-04 DIAGNOSIS — Z888 Allergy status to other drugs, medicaments and biological substances status: Secondary | ICD-10-CM

## 2019-09-04 DIAGNOSIS — N400 Enlarged prostate without lower urinary tract symptoms: Secondary | ICD-10-CM | POA: Diagnosis present

## 2019-09-04 DIAGNOSIS — I129 Hypertensive chronic kidney disease with stage 1 through stage 4 chronic kidney disease, or unspecified chronic kidney disease: Secondary | ICD-10-CM | POA: Diagnosis present

## 2019-09-04 DIAGNOSIS — E875 Hyperkalemia: Secondary | ICD-10-CM | POA: Diagnosis present

## 2019-09-04 DIAGNOSIS — K219 Gastro-esophageal reflux disease without esophagitis: Secondary | ICD-10-CM | POA: Diagnosis present

## 2019-09-04 DIAGNOSIS — M109 Gout, unspecified: Secondary | ICD-10-CM | POA: Diagnosis present

## 2019-09-04 DIAGNOSIS — E785 Hyperlipidemia, unspecified: Secondary | ICD-10-CM | POA: Diagnosis present

## 2019-09-04 DIAGNOSIS — E860 Dehydration: Secondary | ICD-10-CM | POA: Diagnosis present

## 2019-09-04 DIAGNOSIS — E1122 Type 2 diabetes mellitus with diabetic chronic kidney disease: Secondary | ICD-10-CM | POA: Diagnosis present

## 2019-09-04 DIAGNOSIS — G8929 Other chronic pain: Secondary | ICD-10-CM | POA: Diagnosis present

## 2019-09-04 DIAGNOSIS — Z87442 Personal history of urinary calculi: Secondary | ICD-10-CM

## 2019-09-04 LAB — URINALYSIS, COMPLETE (UACMP) WITH MICROSCOPIC
Bilirubin Urine: NEGATIVE
Glucose, UA: >=500 mg/dL — AB
Ketones, ur: 5 mg/dL — AB
Nitrite: POSITIVE — AB
Protein, ur: 30 mg/dL — AB
RBC / HPF: >50 RBC/hpf — ABNORMAL HIGH (ref 0–5)
Specific Gravity, Urine: 1.012 (ref 1.005–1.030)
WBC, UA: >50 WBC/hpf — ABNORMAL HIGH (ref 0–5)
pH: 5 (ref 5.0–8.0)

## 2019-09-04 LAB — CBC
HCT: 38.1 % — ABNORMAL LOW (ref 39.0–52.0)
Hemoglobin: 13 g/dL (ref 13.0–17.0)
MCH: 32.7 pg (ref 26.0–34.0)
MCHC: 34.1 g/dL (ref 30.0–36.0)
MCV: 95.7 fL (ref 80.0–100.0)
Platelets: 221 10*3/uL (ref 150–400)
RBC: 3.98 MIL/uL — ABNORMAL LOW (ref 4.22–5.81)
RDW: 13.1 % (ref 11.5–15.5)
WBC: 11.2 10*3/uL — ABNORMAL HIGH (ref 4.0–10.5)
nRBC: 0 % (ref 0.0–0.2)

## 2019-09-04 LAB — BASIC METABOLIC PANEL
Anion gap: 10 (ref 5–15)
BUN: 54 mg/dL — ABNORMAL HIGH (ref 8–23)
CO2: 22 mmol/L (ref 22–32)
Calcium: 9.6 mg/dL (ref 8.9–10.3)
Chloride: 106 mmol/L (ref 98–111)
Creatinine, Ser: 1.99 mg/dL — ABNORMAL HIGH (ref 0.61–1.24)
GFR calc Af Amer: 39 mL/min — ABNORMAL LOW (ref >60)
GFR calc non Af Amer: 34 mL/min — ABNORMAL LOW (ref >60)
Glucose, Bld: 275 mg/dL — ABNORMAL HIGH (ref 70–99)
Potassium: 5.6 mmol/L — ABNORMAL HIGH (ref 3.5–5.1)
Sodium: 138 mmol/L (ref 135–145)

## 2019-09-04 LAB — GLUCOSE, CAPILLARY: Glucose-Capillary: 273 mg/dL — ABNORMAL HIGH (ref 70–99)

## 2019-09-04 LAB — LACTIC ACID, PLASMA
Lactic Acid, Venous: 2.2 mmol/L (ref 0.5–1.9)
Lactic Acid, Venous: 2.5 mmol/L (ref 0.5–1.9)

## 2019-09-04 LAB — BRAIN NATRIURETIC PEPTIDE: B Natriuretic Peptide: 182 pg/mL — ABNORMAL HIGH (ref 0.0–100.0)

## 2019-09-04 LAB — SARS CORONAVIRUS 2 BY RT PCR (HOSPITAL ORDER, PERFORMED IN ~~LOC~~ HOSPITAL LAB): SARS Coronavirus 2: NEGATIVE

## 2019-09-04 MED ORDER — SODIUM CHLORIDE 0.9 % IV SOLN
2.0000 g | Freq: Once | INTRAVENOUS | Status: AC
  Administered 2019-09-04: 2 g via INTRAVENOUS
  Filled 2019-09-04: qty 20

## 2019-09-04 MED ORDER — HYDROCHLOROTHIAZIDE 12.5 MG PO CAPS: 12.5000 mg | ORAL_CAPSULE | Freq: Every day | ORAL | Status: DC

## 2019-09-04 MED ORDER — TIZANIDINE HCL 4 MG PO TABS
4.0000 mg | ORAL_TABLET | Freq: Three times a day (TID) | ORAL | Status: DC | PRN
  Filled 2019-09-04: qty 1

## 2019-09-04 MED ORDER — TAMSULOSIN HCL 0.4 MG PO CAPS
0.4000 mg | ORAL_CAPSULE | Freq: Every day | ORAL | Status: DC
  Administered 2019-09-05 – 2019-09-07 (×3): 0.4 mg via ORAL
  Filled 2019-09-04 (×3): qty 1

## 2019-09-04 MED ORDER — MORPHINE SULFATE (PF) 4 MG/ML IV SOLN
4.0000 mg | Freq: Once | INTRAVENOUS | Status: AC
  Administered 2019-09-04: 4 mg via INTRAVENOUS
  Filled 2019-09-04: qty 1

## 2019-09-04 MED ORDER — SODIUM CHLORIDE 0.9 % IV BOLUS
1000.0000 mL | Freq: Once | INTRAVENOUS | Status: AC
  Administered 2019-09-04: 1000 mL via INTRAVENOUS

## 2019-09-04 MED ORDER — INSULIN ASPART 100 UNIT/ML ~~LOC~~ SOLN
0.0000 [IU] | Freq: Three times a day (TID) | SUBCUTANEOUS | Status: DC
  Administered 2019-09-05 (×2): 11 [IU] via SUBCUTANEOUS
  Administered 2019-09-05: 3 [IU] via SUBCUTANEOUS
  Administered 2019-09-06 (×3): 7 [IU] via SUBCUTANEOUS
  Administered 2019-09-07: 12:00:00 11 [IU] via SUBCUTANEOUS
  Administered 2019-09-07: 17:00:00 4 [IU] via SUBCUTANEOUS
  Administered 2019-09-07 – 2019-09-08 (×2): 7 [IU] via SUBCUTANEOUS
  Filled 2019-09-04 (×9): qty 1

## 2019-09-04 MED ORDER — FLUTICASONE PROPIONATE 50 MCG/ACT NA SUSP
2.0000 | Freq: Every day | NASAL | Status: DC | PRN
  Filled 2019-09-04: qty 16

## 2019-09-04 MED ORDER — ACETAMINOPHEN 325 MG PO TABS
325.0000 mg | ORAL_TABLET | ORAL | Status: DC | PRN
  Administered 2019-09-04: 325 mg via ORAL
  Filled 2019-09-04: qty 1

## 2019-09-04 MED ORDER — ATORVASTATIN CALCIUM 20 MG PO TABS
40.0000 mg | ORAL_TABLET | Freq: Every day | ORAL | Status: DC
  Administered 2019-09-04 – 2019-09-07 (×4): 40 mg via ORAL
  Filled 2019-09-04 (×4): qty 2

## 2019-09-04 MED ORDER — SPIRONOLACTONE 25 MG PO TABS: 50.0000 mg | ORAL_TABLET | Freq: Every day | ORAL | Status: DC

## 2019-09-04 MED ORDER — HYDROCODONE-ACETAMINOPHEN 10-325 MG PO TABS
1.0000 | ORAL_TABLET | Freq: Two times a day (BID) | ORAL | Status: DC
  Administered 2019-09-04 – 2019-09-08 (×8): 1 via ORAL
  Filled 2019-09-04 (×8): qty 1

## 2019-09-04 MED ORDER — ASPIRIN 81 MG PO CHEW
81.0000 mg | CHEWABLE_TABLET | Freq: Every day | ORAL | Status: DC
  Administered 2019-09-05 – 2019-09-08 (×4): 81 mg via ORAL
  Filled 2019-09-04 (×4): qty 1

## 2019-09-04 MED ORDER — METOPROLOL SUCCINATE ER 50 MG PO TB24
50.0000 mg | ORAL_TABLET | Freq: Every day | ORAL | Status: DC
  Administered 2019-09-04 – 2019-09-08 (×5): 50 mg via ORAL
  Filled 2019-09-04 (×4): qty 1

## 2019-09-04 MED ORDER — SODIUM CHLORIDE 0.9 % IV BOLUS
500.0000 mL | Freq: Once | INTRAVENOUS | Status: AC
  Administered 2019-09-04: 500 mL via INTRAVENOUS

## 2019-09-04 MED ORDER — DOCUSATE SODIUM 100 MG PO CAPS
100.0000 mg | ORAL_CAPSULE | Freq: Two times a day (BID) | ORAL | Status: DC
  Administered 2019-09-04 – 2019-09-08 (×8): 100 mg via ORAL
  Filled 2019-09-04 (×9): qty 1

## 2019-09-04 MED ORDER — VALSARTAN-HYDROCHLOROTHIAZIDE 320-12.5 MG PO TABS: 1.0000 | ORAL_TABLET | Freq: Every day | ORAL | Status: DC

## 2019-09-04 MED ORDER — ONDANSETRON HCL 4 MG/2ML IJ SOLN
4.0000 mg | Freq: Once | INTRAMUSCULAR | Status: AC
  Administered 2019-09-04: 4 mg via INTRAVENOUS
  Filled 2019-09-04: qty 2

## 2019-09-04 MED ORDER — ENOXAPARIN SODIUM 40 MG/0.4ML ~~LOC~~ SOLN
40.0000 mg | SUBCUTANEOUS | Status: DC
  Administered 2019-09-04 – 2019-09-07 (×4): 40 mg via SUBCUTANEOUS
  Filled 2019-09-04 (×4): qty 0.4

## 2019-09-04 MED ORDER — FLUTICASONE PROPIONATE 50 MCG/ACT NA SUSP: 2.0000 | Freq: Every day | NASAL | Status: DC

## 2019-09-04 MED ORDER — DULOXETINE HCL 60 MG PO CPEP
60.0000 mg | ORAL_CAPSULE | Freq: Every day | ORAL | Status: DC
  Administered 2019-09-04: 60 mg via ORAL
  Filled 2019-09-04: qty 1

## 2019-09-04 MED ORDER — GABAPENTIN 300 MG PO CAPS
300.0000 mg | ORAL_CAPSULE | Freq: Three times a day (TID) | ORAL | Status: DC
  Administered 2019-09-04 – 2019-09-08 (×11): 300 mg via ORAL
  Filled 2019-09-04 (×11): qty 1

## 2019-09-04 MED ORDER — SODIUM CHLORIDE 0.9 % IV SOLN
2.0000 g | INTRAVENOUS | Status: DC
  Filled 2019-09-04: qty 20

## 2019-09-04 MED ORDER — SODIUM CHLORIDE 0.45 % IV SOLN: INTRAVENOUS | Status: DC

## 2019-09-04 MED ORDER — INSULIN GLARGINE 100 UNIT/ML ~~LOC~~ SOLN
10.0000 [IU] | Freq: Every day | SUBCUTANEOUS | Status: DC
  Administered 2019-09-04 – 2019-09-06 (×3): 10 [IU] via SUBCUTANEOUS
  Filled 2019-09-04 (×4): qty 0.1

## 2019-09-04 MED ORDER — DULOXETINE HCL 30 MG PO CPEP
60.0000 mg | ORAL_CAPSULE | Freq: Every day | ORAL | Status: DC
  Administered 2019-09-05 – 2019-09-07 (×3): 60 mg via ORAL
  Filled 2019-09-04 (×3): qty 2

## 2019-09-04 MED ORDER — IRBESARTAN 150 MG PO TABS: 300.0000 mg | ORAL_TABLET | Freq: Every day | ORAL | Status: DC

## 2019-09-04 NOTE — H&P (Addendum)
History and Physical    Evan Rivas [id number] DOB: December 19, 1950 DOA: 09/04/2019  PCP: Hubbard Hartshorn, FNP (Confirm with patient/family/NH records and if not entered, this has to be entered at [facility] point of entry) Patient coming from: home  I have personally briefly reviewed patient's old medical records in [place]  Chief Complaint: flank pain  HPI: Evan Rivas is a 69 y.o. male with medical history significant of DM, HTN, HLD, OSA, h/o nephrolithiasis, BPH, Depression  presents to the emergency department today because of concerns for left lower back pain.  Patient states that the pain has been present for roughly the past week.  He states that it reminds him slightly when he has had sciatica in the past although that is always been on his right side.  He does state that it is making it hard for him to get up and he has to go slowly to stand up.  The patient denies any pain radiating down his leg. Denies any significant trauma to his back although he does state that his granddaughter will jump on him from time to time.  States he also has history of kidney stones.  Is not noticing blood in his urine but states it has been cloudy recently.  He has not appreciated any fevers at home although has been having rigors.  ED Course: Patient febrile to 100.9, hypertensive, tachycardic. CT abdomen/pelvis stone protocol reveals NO stone but air in both kidneys c/w emphysematous pyelitis. Lab revealed mild leukocytosis to  11.2; Cr 1. 1.99 (last 1.67 in setting of CKD III, Lactic acid 2.2. U/A markedly positive with cloudy urine, many bacteria, > 50 WBC/hpf. While in ED he is given 2 g Ceftriaxone. TRH called to admit for continued treatment and management of multiple comorbidities.   Review of Systems: As per HPI otherwise 10 point review of systems negative.    Past Medical History:  Diagnosis Date  . Anxiety    panic attacks  . Arthritis   . Cancer (HCC)    lip  . Depression     . Diabetes mellitus without complication ([place])    type 2  . GERD (gastroesophageal reflux disease)   . Gout   . History of kidney stones   . Hyperaldosteronism ([place])    bilateral adrenal hyperplasia  . Hyperlipidemia   . Hypertension   . Pneumonia    2014 and 2018 ( 2018-klebsiella pneumonia     Past Surgical History:  Procedure Laterality Date  . ARTHROSCOPIC REPAIR ACL Left   . CYSTOSCOPY W/ URETERAL STENT PLACEMENT Right 09/07/2014   Procedure: CYSTOSCOPY WITH STENT REPLACEMENT;  Surgeon: Hollice Espy, MD;  Location: [facility] ORS;  Service: Urology;  Laterality: Right;  . CYSTOSCOPY/URETEROSCOPY/HOLMIUM LASER/STENT PLACEMENT Right 08/17/2014   Procedure: CYSTOSCOPY/URETEROSCOPY//STENT PLACEMENT/ attempt of lithotripsy;  Surgeon: Hollice Espy, MD;  Location: [facility] ORS;  Service: Urology;  Laterality: Right;  . HERNIA REPAIR    . MOHS SURGERY     lip  . TOTAL KNEE ARTHROPLASTY Left 04/24/2016   Procedure: LEFT TOTAL KNEE ARTHROPLASTY;  Surgeon: Gaynelle Arabian, MD;  Location: [facility];  Service: Orthopedics;  Laterality: Left;  Adductor Block  . TOTAL KNEE ARTHROPLASTY Right 10/23/2016   Procedure: RIGHT TOTAL KNEE ARTHROPLASTY;  Surgeon: Gaynelle Arabian, MD;  Location: [facility];  Service: Orthopedics;  Laterality: Right;  . URETEROSCOPY WITH HOLMIUM LASER LITHOTRIPSY Right 09/07/2014   Procedure: URETEROSCOPY WITH HOLMIUM LASER LITHOTRIPSY;  Surgeon: Hollice Espy, MD;  Location: [facility] ORS;  Service: Urology;  Laterality: Right;  [name] VASECTOMY     Soc Hx - married 51 years, 1 daughter, 1 grandchild. 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 He lives with his wife.   reports that he has never smoked. He has never used smokeless tobacco. He reports that he does not drink alcohol or use drugs.  Allergies  Allergen Reactions  . Celebrex  [Celecoxib]     GI  . Tetanus Toxoids Other (See Comments)    unkown   . Tetanus-Diphtheria Toxoids Td     Other reaction(s): Unknown    Family History  Problem Relation Age of Onset  . Hypertension Mother   . Cancer Mother   . Hyperlipidemia Mother   . Stroke Father   . Heart disease Father   . Depression Sister   . Hypertension Sister   . Hyperlipidemia Sister      Prior to Admission medications   Medication Sig Start Date End Date Taking? Authorizing Provider  atorvastatin (LIPITOR) 40 MG tablet Take 1 tablet (40 mg total) by mouth at bedtime. 01/01/19  Yes Hubbard Hartshorn, FNP  DULoxetine (CYMBALTA) 60 MG capsule Take 1 capsule (60 mg total) by mouth daily. Patient taking differently: Take 60 mg by mouth daily. Waiting on mail order-has not started yet 06/27/19  Yes Sowles, Drue Stager, MD  gabapentin (NEURONTIN) 300 MG capsule Take 1 capsule (300 mg total) by mouth 3 (three) times daily. Emergency Supply while mail order comes in. 01/01/19 09/04/19 Yes Hubbard Hartshorn, FNP  glipiZIDE (GLUCOTROL) 10 MG tablet Take 1 tablet (10 mg total) by mouth daily before breakfast. 01/01/19  Yes Hubbard Hartshorn, FNP  HYDROcodone-acetaminophen [facility]) 10-325 MG tablet  08/30/19  Yes [provider]  metFORMIN (GLUCOPHAGE) 1000 MG tablet Take 1 tablet (1,000 mg total) by mouth 2 (two) times daily with a meal. 04/03/19  Yes Hubbard Hartshorn, FNP  metoprolol succinate (TOPROL-XL) 50 MG 24 hr tablet Take 1 tablet (50 mg total) by mouth daily. Take with or immediately following a meal. 11/28/18 09/04/19 Yes End, Harrell Gave, MD  spironolactone (ALDACTONE) 25 MG tablet Take 2 tablets (50 mg) by mouth once daily 12/27/18  Yes Dunn, Areta Haber, PA-C  tamsulosin (FLOMAX) 0.4 MG CAPS capsule Take 1 capsule (0.4 mg total) by mouth daily after supper. 01/01/19  Yes Hubbard Hartshorn, FNP  tiZANidine (ZANAFLEX) 4 MG tablet Take 1 tablet (4 mg total) by mouth every 8 (eight) hours as needed for muscle spasms. 06/27/19  Yes Sowles, Drue Stager, MD  valsartan-hydrochlorothiazide (DIOVAN-HCT) 320-12.5 MG  tablet Take 1 tablet by mouth daily. 06/27/19  Yes Sowles, Drue Stager, MD  ASPIRIN 81 PO Take 81 mg by mouth daily.     [provider]  Dulaglutide (TRULICITY) 1.5 0000000 SOPN Inject 1.5 mg into the skin once a week. 06/27/19   Steele Sizer, MD  fluticasone (FLONASE) 50 MCG/ACT nasal spray Place 2 sprays into both nostrils daily. 2 sprays 03/25/18   Hubbard Hartshorn, FNP    Physical Exam: Vitals:   09/04/19 1613 09/04/19 1700 09/04/19 1730 09/04/19 1830  BP: (!) 147/81 (!) 145/78 (!) 161/73 (!) 173/85  Pulse: (!) 124 (!) 120 (!) 114 (!) 116  Resp: 18   18  Temp:      TempSrc:      SpO2: 96% 97% 95% 95%  Weight:      Height:  Constitutional: NAD, calm, comfortable Vitals:   09/04/19 1613 09/04/19 1700 09/04/19 1730 09/04/19 1830  BP: (!) 147/81 (!) 145/78 (!) 161/73 (!) 173/85  Pulse: (!) 124 (!) 120 (!) 114 (!) 116  Resp: 18   18  Temp:      TempSrc:      SpO2: 96% 97% 95% 95%  Weight:      Height:       General:  Obese man in no distress Eyes: PERRL, lids and conjunctivae normal ENMT: Mucous membranes are dry. Posterior pharynx clear of any exudate or lesions.missing many teeth, no caries, no infection Neck: normal but obese, supple, no masses, no thyromegaly Respiratory: clear to auscultation bilaterally, no wheezing, no crackles. Normal respiratory effort. No accessory muscle use.  Cardiovascular: Regular rate and rhythm, no murmurs / rubs / gallops. No extremity edema. 2+ pedal pulses. No carotid bruits.  Abdomen: obese with protuberant abdomen, no tenderness, no masses palpated. No hepatosplenomegaly. Bowel sounds positive.  Musculoskeletal: no clubbing / cyanosis. No joint deformity upper and lower extremities. Good ROM, no contractures. Normal muscle tone.  Skin: no rashes, lesions, ulcers. No induration Neurologic: CN 2-12 grossly intact. Sensation intact, Strength 5/5 in all 4.  Psychiatric: Normal judgment and insight. Alert and oriented x 3. Normal  mood.     Labs on Admission: I have personally reviewed following labs and imaging studies  CBC: Recent Labs  Lab 09/04/19 1532  WBC 11.2*  HGB 13.0  HCT 38.1*  MCV 95.7  PLT A999333   Basic Metabolic Panel: Recent Labs  Lab 09/04/19 1532  NA 138  K 5.6*  CL 106  CO2 22  GLUCOSE 275*  BUN 54*  CREATININE 1.99*  CALCIUM 9.6   GFR: Estimated Creatinine Clearance: 51 mL/min (A) (by C-G formula based on SCr of 1.99 mg/dL (H)). Liver Function Tests: No results for input(s): AST, ALT, ALKPHOS, BILITOT, PROT, ALBUMIN in the last 168 hours. No results for input(s): LIPASE, AMYLASE in the last 168 hours. No results for input(s): AMMONIA in the last 168 hours. Coagulation Profile: No results for input(s): INR, PROTIME in the last 168 hours. Cardiac Enzymes: No results for input(s): CKTOTAL, CKMB, CKMBINDEX, TROPONINI in the last 168 hours. BNP (last 3 results) No results for input(s): PROBNP in the last 8760 hours. HbA1C: No results for input(s): HGBA1C in the last 72 hours. CBG: No results for input(s): GLUCAP in the last 168 hours. Lipid Profile: No results for input(s): CHOL, HDL, LDLCALC, TRIG, CHOLHDL, LDLDIRECT in the last 72 hours. Thyroid Function Tests: No results for input(s): TSH, T4TOTAL, FREET4, T3FREE, THYROIDAB in the last 72 hours. Anemia Panel: No results for input(s): VITAMINB12, FOLATE, FERRITIN, TIBC, IRON, RETICCTPCT in the last 72 hours. Urine analysis:    Component Value Date/Time   COLORURINE YELLOW (A) 09/04/2019 1609   APPEARANCEUR CLOUDY (A) 09/04/2019 1609   LABSPEC 1.012 09/04/2019 1609   PHURINE 5.0 09/04/2019 1609   GLUCOSEU >=500 (A) 09/04/2019 1609   HGBUR LARGE (A) 09/04/2019 1609   BILIRUBINUR NEGATIVE 09/04/2019 1609   BILIRUBINUR neg 08/22/2018 1335   KETONESUR 5 (A) 09/04/2019 1609   PROTEINUR 30 (A) 09/04/2019 1609   UROBILINOGEN 0.2 08/22/2018 1335   NITRITE POSITIVE (A) 09/04/2019 1609   LEUKOCYTESUR MODERATE (A) 09/04/2019  1609    Radiological Exams on Admission: CT Renal Stone Study  Result Date: 09/04/2019 CLINICAL DATA:  Left flank pain for the past week with fever and tachycardia. EXAM: CT ABDOMEN AND PELVIS WITHOUT CONTRAST TECHNIQUE:  Multidetector CT imaging of the abdomen and pelvis was performed following the standard protocol without IV contrast. COMPARISON:  CT abdomen dated June 12, 2018. CT abdomen and pelvis dated July 24, 2014. FINDINGS: Lower chest: No acute abnormality. Minimal subsegmental atelectasis. Hepatobiliary: Diffuse hepatic steatosis. No focal liver abnormality. The gallbladder is unremarkable. No biliary dilatation. Pancreas: Unremarkable. No pancreatic ductal dilatation or surrounding inflammatory changes. Spleen: Normal in size without focal abnormality. Adrenals/Urinary Tract: The adrenal glands are unremarkable. Bilateral renal calculi and simple cysts again noted. New small focus of air within the right and left renal collecting systems (series 2, image 43; series 5, image 110). No hydronephrosis. Chronic bilateral perinephric fat stranding. The bladder is unremarkable. Stomach/Bowel: Stomach is within normal limits. Appendix appears normal. No evidence of bowel wall thickening, distention, or inflammatory changes. Vascular/Lymphatic: Aortic atherosclerosis. No enlarged abdominal or pelvic lymph nodes. Reproductive: Prostate is unremarkable. Other: Small to moderate left and small right fat containing inguinal hernias. Prior right inguinal hernia repair. No free fluid or pneumoperitoneum. Musculoskeletal: No acute or significant osseous findings. IMPRESSION: 1. New small foci of air within the bilateral renal collecting systems, consistent with early emphysematous pyelitis. 2. Bilateral nonobstructive nephrolithiasis. 3. Hepatic steatosis. 4. Aortic Atherosclerosis (ICD10-I70.0). Electronically Signed   By: Titus Dubin M.D.   On: 09/04/2019 18:13    EKG: Independently reviewed. Sinus  tach  Assessment/Plan Active Problems:   Emphysematous pyelitis   Diabetes mellitus type 2 in obese [facility])   Essential hypertension   Depression   CKD stage G3b/A2, GFR 30-44 and albumin creatinine ratio 30-299 mg/g   Gout   Hyperlipidemia   Pyelonephritis  (please populate well all problems here in Problem List. (For example, if patient is on BP meds at home and you resume or decide to hold them, it is a problem that needs to be her. Same for CAD, COPD, HLD and so on)   1. Emphysematous pyelitis/pyelonephritis - positive U/A, fever, rigors, leukocytosis. Received 1st dose Rocephin in ED Plan Med-surg obs  Continue rocephin 1 g q 24, change to oral after 24 hrs or if U Cx reveals resistence.  2. CKD - patient with mild increase in creatinine c/w dehydration, along with elevation in K to 5.6. Plan IV hydration 1/2 NS at 50 cc/hr  Repeat K after 4 hrs IVF, if continued elevation will hold aldactone  F/u Bmet in AM Addendum: repeat K 5.5 Plan  d/c aldactone  Veltassa 16.8 qD x 2 doses, first dose now   3. DM - patient generally well controlled with last A1C Jan '21 of 7.3% Plan Hold glucotrol and metformin in the acute setting with elevated Cr and Lactic acid  F/u [id number]  Last trulicity [id number]  Lantus 10 u qHS  Sliding scale  4. Gout - no active inflammation  5. HTN- elevated BP at admission. Plan Monitor BP  Continue home meds  6. HLD - continue home meds  7. Depression - continue home meds  8. Chronic back pain - will continue home regimen  9. Code status - full code at this time. Discussed need to address Advanced Care directive. Referred to [email or web address] and suggested discussing MOST form with PCP      DVT prophylaxis: lovenox Code Status: full code Family Communication: Wife present during exam. Answered all questions ) Disposition Plan: HOme in 24-48 hours to complete oral Abx  Consults called: none  Admission status: observation medical (inpatient  / obs / tele / medical floor / SDU)  Adella Hare MD [facility] Pager [phone]  If 7PM-7AM, please contact night-coverage [email or web address] Password [email or web address]  09/04/2019, 7:17 PM

## 2019-09-04 NOTE — ED Triage Notes (Signed)
Pt comes via POV from home with c/o lower back pain that started a week ago. Pt states hx of kidney stones. Pt presents with fever of 100.9 and HR-130s.  Pt denies any CP or SOB  Pt states N/V

## 2019-09-04 NOTE — ED Provider Notes (Signed)
[facility] Emergency Department Provider Note   ____________________________________________   I have reviewed the triage vital signs and the nursing notes.   HISTORY  Chief Complaint Low back pain  History limited by: Not Limited   HPI Evan Rivas is a 69 y.o. male who presents to the emergency department today because of concerns for left lower back pain.  Patient states that the pain has been present for roughly the past week.  He states that it reminds him slightly when he has had sciatica in the past although that is always been on his right side.  He does state that it is making it hard for him to get up and he has to go slowly to stand up.  The patient denies any pain radiating down his leg. Denies any significant trauma to his back although he does state that his granddaughter will jump on him from time to time.  States he also has history of kidney stones.  Is not noticing blood in his urine but states it has been cloudy recently.  He has not appreciated any fevers at home although has been having more tremors.  Records reviewed. Per medical record review patient has a history of kidney stone, HLD, HTN.   Past Medical History:  Diagnosis Date  . Anxiety    panic attacks  . Arthritis   . Cancer (HCC)    lip  . Depression   . Diabetes mellitus without complication ([place])    type 2  . GERD (gastroesophageal reflux disease)   . Gout   . History of kidney stones   . Hyperaldosteronism ([place])    bilateral adrenal hyperplasia  . Hyperlipidemia   . Hypertension   . Pneumonia    2014 and 2018 ( 2018-klebsiella pneumonia     Patient Active Problem List   Diagnosis Date Noted  . CKD stage G3b/A2, GFR 30-44 and albumin creatinine ratio 30-299 mg/g 04/09/2019  . Hyperaldosteronism ([place]) 06/24/2018  . Diastolic dysfunction 0000000  . OSA (obstructive sleep apnea) 06/24/2018  . Excessive daytime sleepiness 06/24/2018  . Aortic atherosclerosis  ([place]) 06/17/2018  . Leg edema 12/18/2017  . BPH without urinary obstruction 06/03/2015  . Depression 03/05/2015  . Hyperlipidemia 09/03/2014  . Major depressive disorder, recurrent episode, moderate ([place]) 09/03/2014  . Chronic low back pain 09/03/2014  . Arthralgia of both knees 09/03/2014  . Type 2 diabetes mellitus without complication ([place]) 123456  . Allergic rhinitis 07/04/2014  . Anxiety 07/04/2014  . Continuous opioid dependence ([place]) 07/04/2014  . Diabetes mellitus type 2 in obese ([place]) 07/04/2014  . DDD (degenerative disc disease), lumbar 07/04/2014  . Gout 07/04/2014  . Essential hypertension 07/04/2014  . Flu vaccine need 07/04/2014  . Morbid obesity ([place]) 07/04/2014  . OA (osteoarthritis) of knee 07/04/2014  . Neuralgia neuritis, sciatic nerve 07/04/2014  . Arthralgia of multiple joints 07/04/2014  . H/O malignant neoplasm of skin 12/24/2013    Past Surgical History:  Procedure Laterality Date  . ARTHROSCOPIC REPAIR ACL Left   . CYSTOSCOPY W/ URETERAL STENT PLACEMENT Right 09/07/2014   Procedure: CYSTOSCOPY WITH STENT REPLACEMENT;  Surgeon: Hollice Espy, MD;  Location: [facility] ORS;  Service: Urology;  Laterality: Right;  . CYSTOSCOPY/URETEROSCOPY/HOLMIUM LASER/STENT PLACEMENT Right 08/17/2014   Procedure: CYSTOSCOPY/URETEROSCOPY//STENT PLACEMENT/ attempt of lithotripsy;  Surgeon: Hollice Espy, MD;  Location: [facility] ORS;  Service: Urology;  Laterality: Right;  . HERNIA REPAIR    . MOHS SURGERY     lip  . TOTAL KNEE ARTHROPLASTY  Left 04/24/2016   Procedure: LEFT TOTAL KNEE ARTHROPLASTY;  Surgeon: Gaynelle Arabian, MD;  Location: [facility] OR[facility];  Service: Orthopedics;  Laterality: Left;  Adductor Block  . TOTAL KNEE ARTHROPLASTY Right 10/23/2016   Procedure: RIGHT TOTAL KNEE ARTHROPLASTY;  Surgeon: Gaynelle Arabian, MD;  Location: [facility];  Service: Orthopedics;  Laterality: Right;  . URETEROSCOPY WITH HOLMIUM LASER LITHOTRIPSY Right 09/07/2014   Procedure: URETEROSCOPY WITH HOLMIUM  LASER LITHOTRIPSY;  Surgeon: Hollice Espy, MD;  Location: [facility] ORS;  Service: Urology;  Laterality: Right;  [name] VASECTOMY      Prior to Admission medications   Medication Sig Start Date End Date Taking? Authorizing Provider  ASPIRIN 81 PO Take 81 mg by mouth daily.     [provider]  atorvastatin (LIPITOR) 40 MG tablet Take 1 tablet (40 mg total) by mouth at bedtime. 01/01/19   Hubbard Hartshorn, FNP  Dulaglutide (TRULICITY) 1.5 0000000 SOPN Inject 1.5 mg into the skin once a week. 06/27/19   Steele Sizer, MD  DULoxetine (CYMBALTA) 60 MG capsule Take 1 capsule (60 mg total) by mouth daily. Patient taking differently: Take 60 mg by mouth daily. Waiting on mail order-has not started yet 06/27/19   Steele Sizer, MD  fluticasone [facility]) 50 MCG/ACT nasal spray Place 2 sprays into both nostrils daily. 2 sprays 03/25/18   Hubbard Hartshorn, FNP  gabapentin (NEURONTIN) 300 MG capsule Take 1 capsule (300 mg total) by mouth 3 (three) times daily. Emergency Supply while mail order comes in. 01/01/19 07/01/19  Hubbard Hartshorn, FNP  glipiZIDE (GLUCOTROL) 10 MG tablet Take 1 tablet (10 mg total) by mouth daily before breakfast. 01/01/19   Hubbard Hartshorn, FNP  glucose blood (ACCU-CHEK AVIVA PLUS) test strip Use as instructed 12/12/16   Roselee Nova, MD  hydrOXYzine (ATARAX/VISTARIL) 10 MG tablet Take 1 tablet (10 mg total) by mouth every 8 (eight) hours as needed for anxiety. 06/20/18   Hubbard Hartshorn, FNP  metFORMIN (GLUCOPHAGE) 1000 MG tablet Take 1 tablet (1,000 mg total) by mouth 2 (two) times daily with a meal. 04/03/19   Hubbard Hartshorn, FNP  metoprolol succinate (TOPROL-XL) 50 MG 24 hr tablet Take 1 tablet (50 mg total) by mouth daily. Take with or immediately following a meal. 11/28/18 07/01/19  End, Harrell Gave, MD  spironolactone (ALDACTONE) 25 MG tablet Take 2 tablets (50 mg) by mouth once daily 12/27/18   Rise Mu, PA-C  tamsulosin (FLOMAX) 0.4 MG CAPS capsule Take 1 capsule (0.4 mg total)  by mouth daily after supper. 01/01/19   Hubbard Hartshorn, FNP  tiZANidine (ZANAFLEX) 4 MG tablet Take 1 tablet (4 mg total) by mouth every 8 (eight) hours as needed for muscle spasms. 06/27/19   Steele Sizer, MD  valsartan-hydrochlorothiazide (DIOVAN-HCT) 320-12.5 MG tablet Take 1 tablet by mouth daily. 06/27/19   Steele Sizer, MD    Allergies Celebrex  [celecoxib], Tetanus toxoids, and Tetanus-diphtheria toxoids td  Family History  Problem Relation Age of Onset  . Hypertension Mother   . Cancer Mother   . Hyperlipidemia Mother   . Stroke Father   . Heart disease Father   . Depression Sister   . Hypertension Sister   . Hyperlipidemia Sister     Social History Social History   Tobacco Use  . Smoking status: Never Smoker  . Smokeless tobacco: Never Used  Substance Use Topics  . Alcohol use: No  . Drug use: No    Review of Systems Constitutional: No fever/chills  Eyes: No visual changes. ENT: No sore throat. Cardiovascular: Denies chest pain. Respiratory: Denies shortness of breath. Gastrointestinal: No abdominal pain.  No nausea, no vomiting.  No diarrhea.   Genitourinary: Negative for dysuria. Musculoskeletal: Positive for left lower back pain. Skin: Negative for rash. Neurological: Negative for headaches, focal weakness or numbness.  ____________________________________________   PHYSICAL EXAM:  VITAL SIGNS: ED Triage Vitals  Enc Vitals Group     BP 09/04/19 1531 (!) 154/69     Pulse Rate 09/04/19 1531 (!) 131     Resp 09/04/19 1531 18     Temp 09/04/19 1531 (!) 100.9 F (38.3 C)     Temp Source 09/04/19 1531 Oral     SpO2 09/04/19 1531 96 %     Weight 09/04/19 1531 280 lb (127 kg)     Height 09/04/19 1531 6\' 3"  (1.905 m)     Head Circumference --      Peak Flow --      Pain Score 09/04/19 1537 10   Constitutional: Alert and oriented.  Eyes: Conjunctivae are normal.  ENT      Head: Normocephalic and atraumatic.      Nose: No congestion/rhinnorhea.       Mouth/Throat: Mucous membranes are moist.      Neck: No stridor. Hematological/Lymphatic/Immunilogical: No cervical lymphadenopathy. Cardiovascular: Normal rate, regular rhythm.  No murmurs, rubs, or gallops.  Respiratory: Normal respiratory effort without tachypnea nor retractions. Breath sounds are clear and equal bilaterally. No wheezes/rales/rhonchi. Gastrointestinal: Soft and non tender. No rebound. No guarding.  Genitourinary: Deferred Musculoskeletal: Normal range of motion in all extremities. No lower extremity edema. Neurologic:  Normal speech and language. No gross focal neurologic deficits are appreciated.  Skin:  Skin is warm, dry and intact. No rash noted. Psychiatric: Mood and affect are normal. Speech and behavior are normal. Patient exhibits appropriate insight and judgment.  ____________________________________________    LABS (pertinent positives/negatives)  Lactic 2.2 BMP na 138, k 5.6, glu 275, cr 1.99 CBC wbc 11.2, hgb 13.0, plt 221 UA large hgb dipstick, positive nitrite, moderate leukocytes, > 50 wbc and rbc, many bacteria  ____________________________________________   EKG  I, Nance Pear, attending physician, personally viewed and interpreted this EKG  EKG Time: 1540 Rate: 134 Rhythm: sinus tachycardia Axis: normal Intervals: qtc 412 QRS: low voltage qrs ST changes: no st elevation Impression: abnormal ekg ____________________________________________    RADIOLOGY  CT renal No stones. Air in bilateral kidneys concerning for early emphysematous pyelitis.   ____________________________________________   PROCEDURES  Procedures  ____________________________________________   INITIAL IMPRESSION / ASSESSMENT AND PLAN / ED COURSE  Pertinent labs & imaging results that were available during my care of the patient were reviewed by me and considered in my medical decision making (see chart for details).   Patient presented to the  emergency department today because of concerns for left low back pain.  Patient also describes cloudy urine.  Initial vital signs were concerning for infection given tachycardia and fever.  Given history of kidney stones I did have concern for possible infected stone.  Urine is grossly infected.  CT renal was obtained while this did not show a stone to did show gas in bilateral kidneys.  I discussed with Dr. Diamantina Providence with urology. At this time recommended IV abx. Will plan on admission.   ____________________________________________   FINAL CLINICAL IMPRESSION(S) / ED DIAGNOSES  Final diagnoses:  Pyelonephritis     Note: This dictation was prepared with Dragon dictation. Any transcriptional errors  that result from this process are unintentional     Nance Pear, MD 09/04/19 1844

## 2019-09-05 ENCOUNTER — Encounter: Payer: Self-pay | Admitting: Internal Medicine

## 2019-09-05 DIAGNOSIS — R6520 Severe sepsis without septic shock: Secondary | ICD-10-CM | POA: Diagnosis not present

## 2019-09-05 DIAGNOSIS — E860 Dehydration: Secondary | ICD-10-CM | POA: Diagnosis present

## 2019-09-05 DIAGNOSIS — N400 Enlarged prostate without lower urinary tract symptoms: Secondary | ICD-10-CM | POA: Diagnosis present

## 2019-09-05 DIAGNOSIS — E278 Other specified disorders of adrenal gland: Secondary | ICD-10-CM | POA: Diagnosis present

## 2019-09-05 DIAGNOSIS — E872 Acidosis: Secondary | ICD-10-CM | POA: Diagnosis present

## 2019-09-05 DIAGNOSIS — N171 Acute kidney failure with acute cortical necrosis: Secondary | ICD-10-CM | POA: Diagnosis present

## 2019-09-05 DIAGNOSIS — N12 Tubulo-interstitial nephritis, not specified as acute or chronic: Secondary | ICD-10-CM | POA: Diagnosis present

## 2019-09-05 DIAGNOSIS — E269 Hyperaldosteronism, unspecified: Secondary | ICD-10-CM | POA: Diagnosis present

## 2019-09-05 DIAGNOSIS — B9620 Unspecified Escherichia coli [E. coli] as the cause of diseases classified elsewhere: Secondary | ICD-10-CM | POA: Diagnosis present

## 2019-09-05 DIAGNOSIS — I129 Hypertensive chronic kidney disease with stage 1 through stage 4 chronic kidney disease, or unspecified chronic kidney disease: Secondary | ICD-10-CM | POA: Diagnosis present

## 2019-09-05 DIAGNOSIS — F419 Anxiety disorder, unspecified: Secondary | ICD-10-CM | POA: Diagnosis present

## 2019-09-05 DIAGNOSIS — N1831 Chronic kidney disease, stage 3a: Secondary | ICD-10-CM | POA: Diagnosis present

## 2019-09-05 DIAGNOSIS — I700 Atherosclerosis of aorta: Secondary | ICD-10-CM | POA: Diagnosis present

## 2019-09-05 DIAGNOSIS — M109 Gout, unspecified: Secondary | ICD-10-CM | POA: Diagnosis present

## 2019-09-05 DIAGNOSIS — E669 Obesity, unspecified: Secondary | ICD-10-CM | POA: Diagnosis present

## 2019-09-05 DIAGNOSIS — N401 Enlarged prostate with lower urinary tract symptoms: Secondary | ICD-10-CM

## 2019-09-05 DIAGNOSIS — F329 Major depressive disorder, single episode, unspecified: Secondary | ICD-10-CM | POA: Diagnosis present

## 2019-09-05 DIAGNOSIS — N200 Calculus of kidney: Secondary | ICD-10-CM

## 2019-09-05 DIAGNOSIS — N10 Acute tubulo-interstitial nephritis: Secondary | ICD-10-CM | POA: Diagnosis present

## 2019-09-05 DIAGNOSIS — K219 Gastro-esophageal reflux disease without esophagitis: Secondary | ICD-10-CM | POA: Diagnosis present

## 2019-09-05 DIAGNOSIS — G4733 Obstructive sleep apnea (adult) (pediatric): Secondary | ICD-10-CM | POA: Diagnosis present

## 2019-09-05 DIAGNOSIS — E785 Hyperlipidemia, unspecified: Secondary | ICD-10-CM | POA: Diagnosis present

## 2019-09-05 DIAGNOSIS — G8929 Other chronic pain: Secondary | ICD-10-CM | POA: Diagnosis present

## 2019-09-05 DIAGNOSIS — Z20822 Contact with and (suspected) exposure to covid-19: Secondary | ICD-10-CM | POA: Diagnosis present

## 2019-09-05 DIAGNOSIS — A419 Sepsis, unspecified organism: Secondary | ICD-10-CM | POA: Diagnosis present

## 2019-09-05 DIAGNOSIS — E1165 Type 2 diabetes mellitus with hyperglycemia: Secondary | ICD-10-CM | POA: Diagnosis present

## 2019-09-05 DIAGNOSIS — N179 Acute kidney failure, unspecified: Secondary | ICD-10-CM

## 2019-09-05 DIAGNOSIS — E1122 Type 2 diabetes mellitus with diabetic chronic kidney disease: Secondary | ICD-10-CM | POA: Diagnosis present

## 2019-09-05 DIAGNOSIS — I10 Essential (primary) hypertension: Secondary | ICD-10-CM | POA: Diagnosis not present

## 2019-09-05 DIAGNOSIS — N189 Chronic kidney disease, unspecified: Secondary | ICD-10-CM

## 2019-09-05 DIAGNOSIS — J302 Other seasonal allergic rhinitis: Secondary | ICD-10-CM | POA: Diagnosis present

## 2019-09-05 LAB — GLUCOSE, CAPILLARY
Glucose-Capillary: 297 mg/dL — ABNORMAL HIGH (ref 70–99)
Glucose-Capillary: 271 mg/dL — ABNORMAL HIGH (ref 70–99)
Glucose-Capillary: 132 mg/dL — ABNORMAL HIGH (ref 70–99)
Glucose-Capillary: 187 mg/dL — ABNORMAL HIGH (ref 70–99)

## 2019-09-05 LAB — BASIC METABOLIC PANEL
Anion gap: 10 (ref 5–15)
Anion gap: 11 (ref 5–15)
BUN: 46 mg/dL — ABNORMAL HIGH (ref 8–23)
BUN: 50 mg/dL — ABNORMAL HIGH (ref 8–23)
CO2: 16 mmol/L — ABNORMAL LOW (ref 22–32)
CO2: 20 mmol/L — ABNORMAL LOW (ref 22–32)
Calcium: 8.6 mg/dL — ABNORMAL LOW (ref 8.9–10.3)
Calcium: 8.8 mg/dL — ABNORMAL LOW (ref 8.9–10.3)
Chloride: 105 mmol/L (ref 98–111)
Chloride: 110 mmol/L (ref 98–111)
Creatinine, Ser: 1.78 mg/dL — ABNORMAL HIGH (ref 0.61–1.24)
Creatinine, Ser: 2.14 mg/dL — ABNORMAL HIGH (ref 0.61–1.24)
GFR calc Af Amer: 36 mL/min — ABNORMAL LOW (ref >60)
GFR calc Af Amer: 44 mL/min — ABNORMAL LOW (ref >60)
GFR calc non Af Amer: 31 mL/min — ABNORMAL LOW (ref >60)
GFR calc non Af Amer: 38 mL/min — ABNORMAL LOW (ref >60)
Glucose, Bld: 265 mg/dL — ABNORMAL HIGH (ref 70–99)
Glucose, Bld: 323 mg/dL — ABNORMAL HIGH (ref 70–99)
Potassium: 4.9 mmol/L (ref 3.5–5.1)
Potassium: 5.5 mmol/L — ABNORMAL HIGH (ref 3.5–5.1)
Sodium: 135 mmol/L (ref 135–145)
Sodium: 137 mmol/L (ref 135–145)

## 2019-09-05 LAB — CBC
HCT: 36.5 % — ABNORMAL LOW (ref 39.0–52.0)
Hemoglobin: 12 g/dL — ABNORMAL LOW (ref 13.0–17.0)
MCH: 32.7 pg (ref 26.0–34.0)
MCHC: 32.9 g/dL (ref 30.0–36.0)
MCV: 99.5 fL (ref 80.0–100.0)
Platelets: 233 10*3/uL (ref 150–400)
RBC: 3.67 MIL/uL — ABNORMAL LOW (ref 4.22–5.81)
RDW: 13.3 % (ref 11.5–15.5)
WBC: 15.3 10*3/uL — ABNORMAL HIGH (ref 4.0–10.5)
nRBC: 0 % (ref 0.0–0.2)

## 2019-09-05 LAB — HEMOGLOBIN A1C
Hgb A1c MFr Bld: 8.9 % — ABNORMAL HIGH (ref 4.8–5.6)
Mean Plasma Glucose: 208.73 mg/dL

## 2019-09-05 LAB — HIV ANTIBODY (ROUTINE TESTING W REFLEX): HIV Screen 4th Generation wRfx: NONREACTIVE

## 2019-09-05 MED ORDER — STERILE WATER FOR INJECTION IV SOLN
INTRAVENOUS | Status: DC
  Filled 2019-09-05 (×7): qty 850

## 2019-09-05 MED ORDER — SODIUM CHLORIDE 0.9 % IV SOLN
2.0000 g | INTRAVENOUS | Status: DC
  Administered 2019-09-05 – 2019-09-07 (×3): 2 g via INTRAVENOUS
  Filled 2019-09-05 (×2): qty 2
  Filled 2019-09-05 (×2): qty 20

## 2019-09-05 MED ORDER — PATIROMER SORBITEX CALCIUM 8.4 G PO PACK
16.8000 g | PACK | Freq: Every day | ORAL | Status: AC
  Administered 2019-09-05 – 2019-09-06 (×2): 16.8 g via ORAL
  Filled 2019-09-05 (×4): qty 2

## 2019-09-05 MED ORDER — LIVING WELL WITH DIABETES BOOK
Freq: Once | Status: AC
  Filled 2019-09-05: qty 1

## 2019-09-05 MED ORDER — OXYCODONE-ACETAMINOPHEN 5-325 MG PO TABS
1.0000 | ORAL_TABLET | ORAL | Status: DC | PRN
  Administered 2019-09-05 – 2019-09-07 (×7): 1 via ORAL
  Filled 2019-09-05 (×8): qty 1

## 2019-09-05 NOTE — Progress Notes (Addendum)
   09/05/19 0006  Assess: MEWS Score  Temp 98.7 F (37.1 C)  BP 109/68  Pulse Rate (!) 140  Resp (!) 22  SpO2 93 %  O2 Device Room Air  Assess: MEWS Score  MEWS Temp 0  MEWS Systolic 0  MEWS Pulse 3  MEWS RR 1  MEWS LOC 0  MEWS Score 4  MEWS Score Color Red  Assess: if the MEWS score is Yellow or Red  Were vital signs taken at a resting state? Yes  Focused Assessment Documented focused assessment  Early Detection of Sepsis Score *See Row Information* High  MEWS guidelines implemented *See Row Information* Yes  Treat  MEWS Interventions Administered prn meds/treatments;Other (Comment) (Spoke to ED RN and Ac about pt's MEWs score )  Take Vital Signs  Increase Vital Sign Frequency  Red: Q 1hr X 4 then Q 4hr X 4, if remains red, continue Q 4hrs  Escalate  MEWS: Escalate Red: discuss with charge nurse/RN and provider, consider discussing with RRT  Notify: Charge Nurse/RN  Name of Charge Nurse/RN Notified Butch Penny RN  Date Charge Nurse/RN Notified 09/05/19  Time Charge Nurse/RN Notified 0006  Notify: Provider  Provider Name/Title  (Prior to admission to Department )  Date Provider Notified 09/04/19  Notification Reason Change in status  Response No new orders  Date of Provider Response 09/04/19  Time of Provider Response 2347  Document  Patient Outcome Stabilized after interventions   ED RN communicated with MD in ED.  No MD called after addmitted to unit.  Pt  chnaged to yellow mews during shift

## 2019-09-05 NOTE — Progress Notes (Signed)
Inpatient Diabetes Program Recommendations  AACE/ADA: New Consensus Statement on Inpatient Glycemic Control (2015)  Target Ranges:  Prepandial:   less than 140 mg/dL      Peak postprandial:   less than 180 mg/dL (1-2 hours)      Critically ill patients:  140 - 180 mg/dL   Lab Results  Component Value Date   GLUCAP 297 (H) 09/05/2019   HGBA1C 8.9 (H) 09/04/2019    Review of Glycemic Control Results for Evan Rivas, Evan Rivas (MRN [id number]) as of 09/05/2019 10:53  Ref. Range 10/24/2016 16:51 10/24/2016 20:50 10/25/2016 07:28 09/04/2019 23:37 09/05/2019 08:05  Glucose-Capillary Latest Ref Range: 70 - 99 mg/dL 233 (H) 227 (H) 201 (H) 273 (H) 297 (H)   Diabetes history: DM2 Outpatient Diabetes medications: Glucotrol 10 mg qd + Metformin 1 gm bid +Trulicity qweek Current orders for Inpatient glycemic control: Lantus 10 units qd + Novolog correction tid  Inpatient Diabetes Program Recommendations:    While oral medications held and in the hospital: Please consider: -Increase Lantus to 18 units daily (0.15 units/kg x 124.8 kg) -Add Novolog 4 units tid meal coverage if eats 50% -Decrease Novolog correction to sensitive tid + hs 0-5 units  Thank you, Bethena Roys E. Tymar Polyak, RN, MSN, CDE  Diabetes Coordinator Inpatient Glycemic Control Team Team Pager [phone] (8am-5pm) 09/05/2019 11:02 AM

## 2019-09-05 NOTE — Progress Notes (Addendum)
PROGRESS NOTE    Evan Rivas  ZOX:[id number] DOB: [birth year] DOA: 09/04/2019 PCP: Hubbard Hartshorn, FNP    Chief complaint.  Flank pain.  Brief Narrative:  Patient is a 69 year old male with history of type 2 diabetes, hypertension, obstructive apnea, history of nephrolithiasis, benign prostate hypertrophy and depression who presents to the emergency room complaining of bilateral flank pain.  Symptoms started about a week ago.  He was nauseated yesterday, he started having fever in the emergency room.  He had a mild leukocytosis, he had acute renal failure with metabolic acidosis, hyperkalemia and as well as lactic acidosis of 2.5. CT scan showed no obstructing nephrolithiasis as well as small areas in bilateral renal collecting systems consistent with emphysematous pyelitis.  Urology consult was obtained from the emergency room.  Patient is covered with broad-spectrum antibiotics with Rocephin 2 g daily.   Assessment & Plan:   Active Problems:   Diabetes mellitus type 2 in obese (HCC)   Gout   Essential hypertension   Hyperlipidemia   Depression   CKD stage G3b/A2, GFR 30-44 and albumin creatinine ratio 30-299 mg/g   Emphysematous pyelitis   Pyelonephritis  #1.  Sepsis.  POA.  Secondary to acute pyelonephritis and emphysematous pyelitis. Patient met sepsis criteria with leukocytosis, fever, some tachycardia, acute renal failure and lactic acidosis.  Lactic acidosis may partially due to Metformin.  I will continue fluids as below.  Performed literature searches up-to-date as well as the recent scientific publications, Rocephin high-dose appears to be appropriate.  I also discussed with pharmacy, she will also look at current guidelines and the local bacterial survey data. Also confirmed with urology, they will see the patient today.  If patient Does not improve with antibiotics, may need surgical intervention.  #2. acute renal failure with a normal anion gap metabolic acidosis and  hyperkalemia. Most likely secondary to sepsis.  Need to rule out obstruction.  We will also obtain bladder scan.  Will anchor Foley catheter if there is any urinary retention. Change IV fluids to bicarb drip to correct metabolic acidosis and hyperkalemia. Repeat BMP by noon.  Patient will be monitored closely.  #3.  Uncontrolled type 2 diabetes with hyperglycemia. Continue sliding scale insulin and scheduled Lantus.  Will adjust dose as needed.  4.  Essential hypertension. Discontinue hydrochlorothiazide.  Continue monitor blood pressure.  5.  Depression. Continue home medicines.  6.  Chronic back pain.     DVT prophylaxis: Lovenox Code Status: Full Family Communication: None in room. Disposition Plan:  . Patient came from: Home           . Anticipated d/c place: Home . Barriers to d/c OR conditions which need to be met to effect a safe d/c:   Consultants:   urology  Procedures: None Antimicrobials: Rocephin.  Subjective: Patient had a fever last night.  No additional fever this morning.  He was also nauseated yesterday, no nausea today.  No diarrhea. No short of breath or cough. Still complaining significant bilateral flank pain. He had a larger urination, not bloody.  He has a worsening suprapubic pain with urination.  Objective: Vitals:   09/05/19 0113 09/05/19 0216 09/05/19 0306 09/05/19 0420  BP: 108/60 123/64 137/67 122/64  Pulse: (!) 124 (!) 113 (!) 102 (!) 103  Resp: (!) 22 (!) _0 Temp: 98.2 F (36.8 C) (!) 97.5 F (36.4 C) 98.2 F (36.8 C) 98.6 F (37 C)  TempSrc: Oral Oral Oral Oral  SpO2: 94% 95%  97% 95%  Weight:      Height:        Intake/Output Summary (Last 24 hours) at 09/05/2019 0832 Last data filed at 09/05/2019 0813 Gross per 24 hour  Intake 2461.62 ml  Output 700 ml  Net 1761.62 ml   Filed Weights   09/04/19 1531 09/05/19 0017  Weight: 127 kg 124.8 kg    Examination:  General exam: Appears calm and comfortable    Respiratory system: Clear to auscultation. Respiratory effort normal. Cardiovascular system: S1 & S2 heard, RRR. No JVD, murmurs, rubs, gallops or clicks. No pedal edema. Gastrointestinal system: Abdomen is nondistended, soft and nontender. No organomegaly or masses felt. Normal bowel sounds heard.  No CVA tenderness. Central nervous system: Alert and oriented. No focal neurological deficits. Extremities: Symmetric  Skin: No rashes, lesions or ulcers Psychiatry: Judgement and insight appear normal. Mood & affect appropriate.     Data Reviewed: I have personally reviewed following labs and imaging studies  CBC: Recent Labs  Lab 09/04/19 1532 09/05/19 0010  WBC 11.2* 15.3*  HGB 13.0 12.0*  HCT 38.1* 36.5*  MCV 95.7 99.5  PLT 221 161   Basic Metabolic Panel: Recent Labs  Lab 09/04/19 1532 09/05/19 0010  NA 138 137  K 5.6* 5.5*  CL 106 110  CO2 22 16*  GLUCOSE 275* 323*  BUN 54* 50*  CREATININE 1.99* 2.14*  CALCIUM 9.6 8.6*   GFR: Estimated Creatinine Clearance: 47 mL/min (A) (by C-G formula based on SCr of 2.14 mg/dL (H)). Liver Function Tests: No results for input(s): AST, ALT, ALKPHOS, BILITOT, PROT, ALBUMIN in the last 168 hours. No results for input(s): LIPASE, AMYLASE in the last 168 hours. No results for input(s): AMMONIA in the last 168 hours. Coagulation Profile: No results for input(s): INR, PROTIME in the last 168 hours. Cardiac Enzymes: No results for input(s): CKTOTAL, CKMB, CKMBINDEX, TROPONINI in the last 168 hours. BNP (last 3 results) No results for input(s): PROBNP in the last 8760 hours. HbA1C: No results for input(s): HGBA1C in the last 72 hours. CBG: Recent Labs  Lab 09/04/19 2337 09/05/19 0805  GLUCAP 273* 297*   Lipid Profile: No results for input(s): CHOL, HDL, LDLCALC, TRIG, CHOLHDL, LDLDIRECT in the last 72 hours. Thyroid Function Tests: No results for input(s): TSH, T4TOTAL, FREET4, T3FREE, THYROIDAB in the last 72 hours. Anemia  Panel: No results for input(s): VITAMINB12, FOLATE, FERRITIN, TIBC, IRON, RETICCTPCT in the last 72 hours. Sepsis Labs: Recent Labs  Lab 09/04/19 1749 09/04/19 1922  LATICACIDVEN 2.2* 2.5*    Recent Results (from the past 240 hour(s))  Blood culture (routine x 2)     Status: None (Preliminary result)   Collection Time: 09/04/19  6:38 PM   Specimen: BLOOD  Result Value Ref Range Status   Specimen Description BLOOD LEFT ANTECUBITAL  Final   Special Requests   Final    BOTTLES DRAWN AEROBIC AND ANAEROBIC Blood Culture results may not be optimal due to an excessive volume of blood received in culture bottles   Culture   Final    NO GROWTH < 12 HOURS Performed at [facility], [address] [place], [place] [address]    Report Status PENDING  Incomplete  Blood culture (routine x 2)     Status: None (Preliminary result)   Collection Time: 09/04/19  6:43 PM   Specimen: BLOOD  Result Value Ref Range Status   Specimen Description BLOOD RIGHT ANTECUBITAL  Final   Special Requests   Final  BOTTLES DRAWN AEROBIC AND ANAEROBIC Blood Culture results may not be optimal due to an excessive volume of blood received in culture bottles   Culture   Final    NO GROWTH < 12 HOURS Performed at [facility], [address] [place], [place] [address]    Report Status PENDING  Incomplete  SARS Coronavirus 2 by RT PCR (hospital order, performed in [facility] hospital lab) Nasopharyngeal Nasopharyngeal Swab     Status: None   Collection Time: 09/04/19  6:43 PM   Specimen: Nasopharyngeal Swab  Result Value Ref Range Status   SARS Coronavirus 2 NEGATIVE NEGATIVE Final    Comment: (NOTE) SARS-CoV-2 target nucleic acids are NOT DETECTED. The SARS-CoV-2 RNA is generally detectable in upper and lower respiratory specimens during the acute phase of infection. The lowest concentration of SARS-CoV-2 viral copies this assay can detect is 250 copies / mL. A negative result does not  preclude SARS-CoV-2 infection and should not be used as the sole basis for treatment or other patient management decisions.  A negative result may occur with improper specimen collection / handling, submission of specimen other than nasopharyngeal swab, presence of viral mutation(s) within the areas targeted by this assay, and inadequate number of viral copies (<250 copies / mL). A negative result must be combined with clinical observations, patient history, and epidemiological information. Fact Sheet for Patients:   StrictlyIdeas.no Fact Sheet for Healthcare Providers: BankingDealers.co.za This test is not yet approved or cleared  by the [place] FDA and has been authorized for detection and/or diagnosis of SARS-CoV-2 by FDA under an Emergency Use Authorization (EUA).  This EUA will remain in effect (meaning this test can be used) for the duration of the COVID-19 declaration under Section 564(b)(1) of the Act, 21 U.S.C. section 360bbb-3(b)(1), unless the authorization is terminated or revoked sooner. Performed at [facility], [address], [place], [place] [address]          Radiology Studies: CT Renal Stone Study  Result Date: 09/04/2019 CLINICAL DATA:  Left flank pain for the past week with fever and tachycardia. EXAM: CT ABDOMEN AND PELVIS WITHOUT CONTRAST TECHNIQUE: Multidetector CT imaging of the abdomen and pelvis was performed following the standard protocol without IV contrast. COMPARISON:  CT abdomen dated June 12, 2018. CT abdomen and pelvis dated July 24, 2014. FINDINGS: Lower chest: No acute abnormality. Minimal subsegmental atelectasis. Hepatobiliary: Diffuse hepatic steatosis. No focal liver abnormality. The gallbladder is unremarkable. No biliary dilatation. Pancreas: Unremarkable. No pancreatic ductal dilatation or surrounding inflammatory changes. Spleen: Normal in size without focal abnormality.  Adrenals/Urinary Tract: The adrenal glands are unremarkable. Bilateral renal calculi and simple cysts again noted. New small focus of air within the right and left renal collecting systems (series 2, image 43; series 5, image 110). No hydronephrosis. Chronic bilateral perinephric fat stranding. The bladder is unremarkable. Stomach/Bowel: Stomach is within normal limits. Appendix appears normal. No evidence of bowel wall thickening, distention, or inflammatory changes. Vascular/Lymphatic: Aortic atherosclerosis. No enlarged abdominal or pelvic lymph nodes. Reproductive: Prostate is unremarkable. Other: Small to moderate left and small right fat containing inguinal hernias. Prior right inguinal hernia repair. No free fluid or pneumoperitoneum. Musculoskeletal: No acute or significant osseous findings. IMPRESSION: 1. New small foci of air within the bilateral renal collecting systems, consistent with early emphysematous pyelitis. 2. Bilateral nonobstructive nephrolithiasis. 3. Hepatic steatosis. 4. Aortic Atherosclerosis (ICD10-I70.0). Electronically Signed   By: Titus Dubin M.D.   On: 09/04/2019 18:13  Scheduled Meds: . aspirin  81 mg Oral Daily  . atorvastatin  40 mg Oral QHS  . docusate sodium  100 mg Oral BID  . DULoxetine  60 mg Oral QHS  . enoxaparin (LOVENOX) injection  40 mg Subcutaneous Q24H  . gabapentin  300 mg Oral TID  . hydrochlorothiazide  12.5 mg Oral Daily  . HYDROcodone-acetaminophen  1 tablet Oral BID  . insulin aspart  0-20 Units Subcutaneous TID WC  . insulin glargine  10 Units Subcutaneous QHS  . metoprolol succinate  50 mg Oral Daily  . patiromer  16.8 g Oral Daily  . tamsulosin  0.4 mg Oral QPC supper   Continuous Infusions: . cefTRIAXone (ROCEPHIN)  IV    .  sodium bicarbonate (isotonic) infusion in sterile water       LOS: 0 days    Time spent: 35 minutes    Sharen Hones, MD [facility]   To contact the attending provider between 7A-7P or  the covering provider during after hours 7P-7A, please log into the web site [email or web address] and access using universal [place] password for that web site. If you do not have the password, please call the hospital operator.  09/05/2019, 8:32 AM

## 2019-09-05 NOTE — Consult Note (Signed)
Urology Consult  I have been asked to see the patient by Dr. Archie Balboa, for evaluation and management of bilateral emphysematous pyelitis.  Chief Complaint: Left low back pain  History of Present Illness: Evan Rivas is a 69 y.o. year old male with PMH BPH on Flomax managed by PCP and nephrolithiasis requiring staged right ureteroscopy with Dr. Erlene Quan in 2016 who presented to the ED yesterday with reports of a 1 week history of left low back pain.  He was found to be febrile, tachycardic, and hypertensive in triage.  UA notable for nitrites,>50 WBCs/hpf, >50 RBCs/hpf, many bacteria, and WBC clumps.  Lactate elevated at 2.2.  CT stone study revealed bilateral nonobstructive nephrolithiasis and bilateral gas within the renal collecting systems consistent with emphysematous pyelitis.  Blood cultures pending with no growth at <12 hours; urine culture pending.  On antibiotics as below.  WBC count up today, 15.3.  Creatinine up today, 2.14.  Today, patient reports feeling weak with stable left low back pain.  He feels he is emptying his bladder well, however he also reports a years-long history of weak and intermittent urinary stream.  He has been considering following up with urology for evaluation of this.  Of note, review of his operative note with Dr. Erlene Quan in 2016 reveals intraoperative findings of mild bulbar urethral stricture and enlarged prostatic urethra with slight asymmetry/enlargement of the left lateral lobe.  Anti-infectives (From admission, onward)   Start     Dose/Rate Route Frequency Ordered Stop   09/05/19 1800  cefTRIAXone (ROCEPHIN) 2 g in sodium chloride 0.9 % 100 mL IVPB     2 g 200 mL/hr over 30 Minutes Intravenous Every 24 hours 09/04/19 2005 09/07/19 1759   09/04/19 1745  cefTRIAXone (ROCEPHIN) 2 g in sodium chloride 0.9 % 100 mL IVPB     2 g 200 mL/hr over 30 Minutes Intravenous  Once 09/04/19 1731 09/04/19 1821      Past Medical History:  Diagnosis Date   . Anxiety    panic attacks  . Arthritis   . Cancer (HCC)    lip  . Depression   . Diabetes mellitus without complication ([place])    type 2  . GERD (gastroesophageal reflux disease)   . Gout   . History of kidney stones   . Hyperaldosteronism ([place])    bilateral adrenal hyperplasia  . Hyperlipidemia   . Hypertension   . Pneumonia    2014 and 2018 ( 2018-klebsiella pneumonia     Past Surgical History:  Procedure Laterality Date  . ARTHROSCOPIC REPAIR ACL Left   . CYSTOSCOPY W/ URETERAL STENT PLACEMENT Right 09/07/2014   Procedure: CYSTOSCOPY WITH STENT REPLACEMENT;  Surgeon: Hollice Espy, MD;  Location: [facility];  Service: Urology;  Laterality: Right;  . CYSTOSCOPY/URETEROSCOPY/HOLMIUM LASER/STENT PLACEMENT Right 08/17/2014   Procedure: CYSTOSCOPY/URETEROSCOPY//STENT PLACEMENT/ attempt of lithotripsy;  Surgeon: Hollice Espy, MD;  Location: [facility];  Service: Urology;  Laterality: Right;  . HERNIA REPAIR    . MOHS SURGERY     lip  . TOTAL KNEE ARTHROPLASTY Left 04/24/2016   Procedure: LEFT TOTAL KNEE ARTHROPLASTY;  Surgeon: Gaynelle Arabian, MD;  Location: [facility];  Service: Orthopedics;  Laterality: Left;  Adductor Block  . TOTAL KNEE ARTHROPLASTY Right 10/23/2016   Procedure: RIGHT TOTAL KNEE ARTHROPLASTY;  Surgeon: Gaynelle Arabian, MD;  Location: [facility];  Service: Orthopedics;  Laterality: Right;  . URETEROSCOPY WITH HOLMIUM LASER LITHOTRIPSY Right 09/07/2014   Procedure: URETEROSCOPY WITH HOLMIUM LASER LITHOTRIPSY;  Surgeon: Hollice Espy, MD;  Location: [facility];  Service: Urology;  Laterality: Right;  [name] VASECTOMY      Home Medications:  Current Meds  Medication Sig  . ASPIRIN 81 PO Take 81 mg by mouth daily.   [name] atorvastatin (LIPITOR) 40 MG tablet Take 1 tablet (40 mg total) by mouth at bedtime.  . Dulaglutide (TRULICITY) 1.5 UM/3.5TI SOPN Inject 1.5 mg into the skin once a week.  . DULoxetine (CYMBALTA) 60 MG capsule Take 1 capsule (60 mg total) by mouth daily. (Patient  taking differently: Take 60 mg by mouth daily. Waiting on mail order-has not started yet)  . fluticasone (FLONASE) 50 MCG/ACT nasal spray Place 2 sprays into both nostrils daily. 2 sprays  . gabapentin (NEURONTIN) 300 MG capsule Take 1 capsule (300 mg total) by mouth 3 (three) times daily. Emergency Supply while mail order comes in.  [name] glipiZIDE (GLUCOTROL) 10 MG tablet Take 1 tablet (10 mg total) by mouth daily before breakfast.  . HYDROcodone-acetaminophen (NORCO) 10-325 MG tablet   . metFORMIN (GLUCOPHAGE) 1000 MG tablet Take 1 tablet (1,000 mg total) by mouth 2 (two) times daily with a meal.  . metoprolol succinate (TOPROL-XL) 50 MG 24 hr tablet Take 1 tablet (50 mg total) by mouth daily. Take with or immediately following a meal.  . spironolactone (ALDACTONE) 25 MG tablet Take 2 tablets (50 mg) by mouth once daily  . tamsulosin (FLOMAX) 0.4 MG CAPS capsule Take 1 capsule (0.4 mg total) by mouth daily after supper.  [name] tiZANidine (ZANAFLEX) 4 MG tablet Take 1 tablet (4 mg total) by mouth every 8 (eight) hours as needed for muscle spasms.  . valsartan-hydrochlorothiazide (DIOVAN-HCT) 320-12.5 MG tablet Take 1 tablet by mouth daily.    Allergies:  Allergies  Allergen Reactions  . Celebrex  [Celecoxib]     GI  . Tetanus Toxoids Other (See Comments)    unkown  . Tetanus-Diphtheria Toxoids Td     Other reaction(s): Unknown    Family History  Problem Relation Age of Onset  . Hypertension Mother   . Cancer Mother   . Hyperlipidemia Mother   . Stroke Father   . Heart disease Father   . Depression Sister   . Hypertension Sister   . Hyperlipidemia Sister     Social History:  reports that he has never smoked. He has never used smokeless tobacco. He reports that he does not drink alcohol or use drugs.  ROS: A complete review of systems was performed.  All systems are negative except for pertinent findings as noted.  Physical Exam:  Vital signs in last 24 hours: Temp:  [97.5 F (36.4  C)-101.5 F (38.6 C)] 98.6 F (37 C) (06/04 0420) Pulse Rate:  [102-149] 103 (06/04 0420) Resp:  [13-25] 20 (06/04 0420) BP: (102-173)/(60-97) 122/64 (06/04 0420) SpO2:  [92 %-99 %] 95 % (06/04 0420) Weight:  [124.8 kg-127 kg] 124.8 kg (06/04 0017) Constitutional:  Alert and oriented, no acute distress HEENT: [place] AT, moist mucus membranes. Cardiovascular: No clubbing, cyanosis, or edema. Respiratory: Normal respiratory effort Skin: No rashes, bruises or suspicious lesions Neurologic: Grossly intact, no focal deficits, moving all 4 extremities Psychiatric: Normal mood and affect  Laboratory Data:  Recent Labs    09/04/19 1532 09/05/19 0010  WBC 11.2* 15.3*  HGB 13.0 12.0*  HCT 38.1* 36.5*   Recent Labs    09/04/19 1532 09/05/19 0010  NA 138 137  K 5.6* 5.5*  CL 106 110  CO2 22 16*  GLUCOSE 275* 323*  BUN 54* 50*  CREATININE 1.99* 2.14*  CALCIUM 9.6 8.6*   Urinalysis    Component Value Date/Time   COLORURINE YELLOW (A) 09/04/2019 1609   APPEARANCEUR CLOUDY (A) 09/04/2019 1609   LABSPEC 1.012 09/04/2019 1609   PHURINE 5.0 09/04/2019 1609   GLUCOSEU >=500 (A) 09/04/2019 1609   HGBUR LARGE (A) 09/04/2019 1609   BILIRUBINUR NEGATIVE 09/04/2019 1609   BILIRUBINUR neg 08/22/2018 1335   KETONESUR 5 (A) 09/04/2019 1609   PROTEINUR 30 (A) 09/04/2019 1609   UROBILINOGEN 0.2 08/22/2018 1335   NITRITE POSITIVE (A) 09/04/2019 1609   LEUKOCYTESUR MODERATE (A) 09/04/2019 1609   Results for orders placed or performed during the hospital encounter of 09/04/19  Blood culture (routine x 2)     Status: None (Preliminary result)   Collection Time: 09/04/19  6:38 PM   Specimen: BLOOD  Result Value Ref Range Status   Specimen Description BLOOD LEFT ANTECUBITAL  Final   Special Requests   Final    BOTTLES DRAWN AEROBIC AND ANAEROBIC Blood Culture results may not be optimal due to an excessive volume of blood received in culture bottles   Culture   Final    NO GROWTH < 12  HOURS Performed at [facility], [address], [place], [place] [address]    Report Status PENDING  Incomplete  Blood culture (routine x 2)     Status: None (Preliminary result)   Collection Time: 09/04/19  6:43 PM   Specimen: BLOOD  Result Value Ref Range Status   Specimen Description BLOOD RIGHT ANTECUBITAL  Final   Special Requests   Final    BOTTLES DRAWN AEROBIC AND ANAEROBIC Blood Culture results may not be optimal due to an excessive volume of blood received in culture bottles   Culture   Final    NO GROWTH < 12 HOURS Performed at [facility], [address], [place], [place] [address]    Report Status PENDING  Incomplete  SARS Coronavirus 2 by RT PCR (hospital order, performed in [facility] hospital lab) Nasopharyngeal Nasopharyngeal Swab     Status: None   Collection Time: 09/04/19  6:43 PM   Specimen: Nasopharyngeal Swab  Result Value Ref Range Status   SARS Coronavirus 2 NEGATIVE NEGATIVE Final    Comment: (NOTE) SARS-CoV-2 target nucleic acids are NOT DETECTED. The SARS-CoV-2 RNA is generally detectable in upper and lower respiratory specimens during the acute phase of infection. The lowest concentration of SARS-CoV-2 viral copies this assay can detect is 250 copies / mL. A negative result does not preclude SARS-CoV-2 infection and should not be used as the sole basis for treatment or other patient management decisions.  A negative result may occur with improper specimen collection / handling, submission of specimen other than nasopharyngeal swab, presence of viral mutation(s) within the areas targeted by this assay, and inadequate number of viral copies (<250 copies / mL). A negative result must be combined with clinical observations, patient history, and epidemiological information. Fact Sheet for Patients:   StrictlyIdeas.no Fact Sheet for Healthcare Providers: BankingDealers.co.za This test is  not yet approved or cleared  by the [place] FDA and has been authorized for detection and/or diagnosis of SARS-CoV-2 by FDA under an Emergency Use Authorization (EUA).  This EUA will remain in effect (meaning this test can be used) for the duration of the COVID-19 declaration under Section 564(b)(1) of the Act, 21 U.S.C. section 360bbb-3(b)(1), unless the authorization is terminated or revoked sooner.  Performed at [facility], [address], [place], [place] [address]    Radiologic Imaging: CT Renal Stone Study  Result Date: 09/04/2019 CLINICAL DATA:  Left flank pain for the past week with fever and tachycardia. EXAM: CT ABDOMEN AND PELVIS WITHOUT CONTRAST TECHNIQUE: Multidetector CT imaging of the abdomen and pelvis was performed following the standard protocol without IV contrast. COMPARISON:  CT abdomen dated June 12, 2018. CT abdomen and pelvis dated July 24, 2014. FINDINGS: Lower chest: No acute abnormality. Minimal subsegmental atelectasis. Hepatobiliary: Diffuse hepatic steatosis. No focal liver abnormality. The gallbladder is unremarkable. No biliary dilatation. Pancreas: Unremarkable. No pancreatic ductal dilatation or surrounding inflammatory changes. Spleen: Normal in size without focal abnormality. Adrenals/Urinary Tract: The adrenal glands are unremarkable. Bilateral renal calculi and simple cysts again noted. New small focus of air within the right and left renal collecting systems (series 2, image 43; series 5, image 110). No hydronephrosis. Chronic bilateral perinephric fat stranding. The bladder is unremarkable. Stomach/Bowel: Stomach is within normal limits. Appendix appears normal. No evidence of bowel wall thickening, distention, or inflammatory changes. Vascular/Lymphatic: Aortic atherosclerosis. No enlarged abdominal or pelvic lymph nodes. Reproductive: Prostate is unremarkable. Other: Small to moderate left and small right fat containing inguinal hernias. Prior  right inguinal hernia repair. No free fluid or pneumoperitoneum. Musculoskeletal: No acute or significant osseous findings. IMPRESSION: 1. New small foci of air within the bilateral renal collecting systems, consistent with early emphysematous pyelitis. 2. Bilateral nonobstructive nephrolithiasis. 3. Hepatic steatosis. 4. Aortic Atherosclerosis (ICD10-I70.0). Electronically Signed   By: Titus Dubin M.D.   On: 09/04/2019 18:13   Assessment & Plan:  69 year old male with PMH nephrolithiasis, BPH on Flomax, and incidental bulbourethral stricture admitted with a 1 week history of left low back pain with grossly infected UA and CT findings consistent with bilateral emphysematous pyelitis.  Agree with inpatient admission for fluid resuscitation and IV antibiotics for management of urinary infection.   Incomplete bladder emptying may be contributory in this case given his history of BPH, urethral stricture, and reports of weak/intermittent urinary stream.  He will require evaluation of bladder emptying with PVR with placement of Foley catheter if he is found to have an elevated residual.  Uptrending creatinine and WBC count may represent typical early findings associated with renal infection, however he will require serial labs to ensure these begin to downtrend within the next 24-48 hours.  If these labs continue to rise, would recommend repeat imaging for evaluation of possible abscess development.  Recommendations: -Agree with broad-spectrum antibiotics, follow urine cultures and narrow as indicated -Bladder scan this afternoon to ensure appropriate emptying.  If PVR is elevated >226mL, recommend Foley placement for bladder decompression -Follow daily WBCs and creatinine; if these remain elevated or if patient acutely decompensates, recommend repeat imaging -We will arrange outpatient follow-up in our clinic to reestablish care and plan for cystoscopy for evaluation of BPH and urethral  stricture  Thank you for involving me in this patient's care, I will continue to follow along.  Debroah Loop, PA-C 09/05/2019 8:39 AM

## 2019-09-06 DIAGNOSIS — N12 Tubulo-interstitial nephritis, not specified as acute or chronic: Secondary | ICD-10-CM

## 2019-09-06 DIAGNOSIS — A419 Sepsis, unspecified organism: Secondary | ICD-10-CM

## 2019-09-06 DIAGNOSIS — R6520 Severe sepsis without septic shock: Secondary | ICD-10-CM

## 2019-09-06 DIAGNOSIS — I10 Essential (primary) hypertension: Secondary | ICD-10-CM

## 2019-09-06 LAB — BASIC METABOLIC PANEL
Anion gap: 11 (ref 5–15)
BUN: 37 mg/dL — ABNORMAL HIGH (ref 8–23)
CO2: 26 mmol/L (ref 22–32)
Calcium: 8.8 mg/dL — ABNORMAL LOW (ref 8.9–10.3)
Chloride: 100 mmol/L (ref 98–111)
Creatinine, Ser: 1.7 mg/dL — ABNORMAL HIGH (ref 0.61–1.24)
GFR calc Af Amer: 47 mL/min — ABNORMAL LOW (ref >60)
GFR calc non Af Amer: 41 mL/min — ABNORMAL LOW (ref >60)
Glucose, Bld: 222 mg/dL — ABNORMAL HIGH (ref 70–99)
Potassium: 4.8 mmol/L (ref 3.5–5.1)
Sodium: 137 mmol/L (ref 135–145)

## 2019-09-06 LAB — CBC WITH DIFFERENTIAL/PLATELET
Abs Immature Granulocytes: 0.02 10*3/uL (ref 0.00–0.07)
Basophils Absolute: 0 10*3/uL (ref 0.0–0.1)
Basophils Relative: 0 %
Eosinophils Absolute: 0.1 10*3/uL (ref 0.0–0.5)
Eosinophils Relative: 1 %
HCT: 32.2 % — ABNORMAL LOW (ref 39.0–52.0)
Hemoglobin: 10.7 g/dL — ABNORMAL LOW (ref 13.0–17.0)
Immature Granulocytes: 0 %
Lymphocytes Relative: 17 %
Lymphs Abs: 1.3 10*3/uL (ref 0.7–4.0)
MCH: 32.6 pg (ref 26.0–34.0)
MCHC: 33.2 g/dL (ref 30.0–36.0)
MCV: 98.2 fL (ref 80.0–100.0)
Monocytes Absolute: 0.7 10*3/uL (ref 0.1–1.0)
Monocytes Relative: 9 %
Neutro Abs: 5.5 10*3/uL (ref 1.7–7.7)
Neutrophils Relative %: 73 %
Platelets: 174 10*3/uL (ref 150–400)
RBC: 3.28 MIL/uL — ABNORMAL LOW (ref 4.22–5.81)
RDW: 13 % (ref 11.5–15.5)
WBC: 7.6 10*3/uL (ref 4.0–10.5)
nRBC: 0 % (ref 0.0–0.2)

## 2019-09-06 LAB — MAGNESIUM: Magnesium: 1.5 mg/dL — ABNORMAL LOW (ref 1.7–2.4)

## 2019-09-06 LAB — GLUCOSE, CAPILLARY
Glucose-Capillary: 213 mg/dL — ABNORMAL HIGH (ref 70–99)
Glucose-Capillary: 208 mg/dL — ABNORMAL HIGH (ref 70–99)
Glucose-Capillary: 238 mg/dL — ABNORMAL HIGH (ref 70–99)
Glucose-Capillary: 260 mg/dL — ABNORMAL HIGH (ref 70–99)

## 2019-09-06 MED ORDER — SODIUM CHLORIDE 0.9 % IV SOLN: INTRAVENOUS | Status: DC

## 2019-09-06 MED ORDER — MAGNESIUM SULFATE 4 GM/100ML IV SOLN
4.0000 g | Freq: Once | INTRAVENOUS | Status: AC
  Administered 2019-09-06: 4 g via INTRAVENOUS
  Filled 2019-09-06: qty 100

## 2019-09-06 NOTE — Progress Notes (Signed)
Offered to ambulate pt around unit. Pt refused at this time.

## 2019-09-06 NOTE — Progress Notes (Signed)
PROGRESS NOTE    Evan Rivas  NWG:[id number] DOB: 06/08/1950 DOA: 09/04/2019 PCP: Hubbard Hartshorn, FNP   Chief complaint.  Left flank pain.  Brief Narrative:  Patient is a 69 year old male with history of type 2 diabetes, hypertension, obstructive apnea, history of nephrolithiasis, benign prostate hypertrophy and depression who presents to the emergency room complaining of bilateral flank pain.  Symptoms started about a week ago.  He was nauseated yesterday, he started having fever in the emergency room.  He had a mild leukocytosis, he had acute renal failure with metabolic acidosis, hyperkalemia and as well as lactic acidosis of 2.5. CT scan showed no obstructing nephrolithiasis as well as small areas in bilateral renal collecting systems consistent with emphysematous pyelitis.  Urology consult was obtained from the emergency room.  Patient is covered with broad-spectrum antibiotics with Rocephin 2 g daily.  6/5.  Urine culture grow gram negative rods, blood culture still negative today.  Rocephin continued.  Creatinine level dropped down to 1.7 with a baseline of 1.4.    Assessment & Plan:   Active Problems:   Diabetes mellitus type 2 in obese (HCC)   Gout   Essential hypertension   Hyperlipidemia   Depression   CKD stage G3b/A2, GFR 30-44 and albumin creatinine ratio 30-299 mg/g   Emphysematous pyelitis   Pyelonephritis   Sepsis ([place])  #1.  Sepsis secondary to acute pyelonephritis and emphysematous pyelitis. Bladder scan did not show any significant urinary retention.  Appreciate urology consult. Patient clinically improving.  Continue Rocephin at 2 g daily.  Will switch to oral antibiotics when clinically stable, for a total course of 2 weeks.  Urine culture so far has negative gram-negative rods, blood culture so far negative.  #2.  Acute renal failure on chronic kidney disease stage IIIa.  With normal anion gap metabolic acidosis and hyperkalemia. Condition much  improved.  Renal function close to baseline.  We will continue IV fluids, but change to normal saline as patient metabolic acidosis has improved.  3.  Uncontrolled type 2 diabetes with hyperglycemia. Continue current regimen.  4.  Essential hypertension. Continue current regimen.  5.  Depression.  6.  Chronic back pain. Continue some as needed pain medicine.  DVT prophylaxis: Lovenox Code Status: Full Family Communication: None in room. Disposition Plan:   Patient came from: Home                                                                                                               Anticipated d/c place: Home  Barriers to d/c OR conditions which need to be met to effect a safe d/c:   Consultants:   urology  Procedures: None Antimicrobials: Rocephin.  Subjective: Patient still complaining some pain in left lower back.  No nausea vomiting.  No fever or chills. Bladder scan did not show significant urinary retention. No dysuria hematuria.  Objective: Vitals:   09/05/19 0846 09/05/19 1656 09/05/19 2345 09/06/19 0724  BP: (!) 141/71 (!) 159/79 137/68 138/66  Pulse: 98 (!) 102 [name])  110 (!) 102  Resp: _0 Temp: 98.9 F (37.2 C) 99.2 F (37.3 C) 99.9 F (37.7 C) 99 F (37.2 C)  TempSrc: Oral Oral Oral Oral  SpO2: 98% 100% 99% 95%  Weight:      Height:        Intake/Output Summary (Last 24 hours) at 09/06/2019 0833 Last data filed at 09/05/2019 1009 Gross per 24 hour  Intake 240 ml  Output 100 ml  Net 140 ml   Filed Weights   09/04/19 1531 09/05/19 0017  Weight: 127 kg 124.8 kg    Examination:  General exam: Appears calm and comfortable  Respiratory system: Clear to auscultation. Respiratory effort normal. Cardiovascular system: S1 & S2 heard, RRR. No JVD, murmurs, rubs, gallops or clicks. No pedal edema. Gastrointestinal system: Abdomen is nondistended, soft and nontender. No organomegaly or masses felt. Normal bowel sounds heard.  No CVA  tenderness. Central nervous system: Alert and oriented. No focal neurological deficits. Extremities: Symmetric 5 x 5 power. Skin: No rashes, lesions or ulcers Psychiatry: Judgement and insight appear normal. Mood & affect appropriate.     Data Reviewed: I have personally reviewed following labs and imaging studies  CBC: Recent Labs  Lab 09/04/19 1532 09/05/19 0010 09/06/19 0619  WBC 11.2* 15.3* 7.6  NEUTROABS  --   --  5.5  HGB 13.0 12.0* 10.7*  HCT 38.1* 36.5* 32.2*  MCV 95.7 99.5 98.2  PLT 221 233 287   Basic Metabolic Panel: Recent Labs  Lab 09/04/19 1532 09/05/19 0010 09/05/19 1159 09/06/19 0619  NA 138 137 135 137  K 5.6* 5.5* 4.9 4.8  CL 106 110 105 100  CO2 22 16* 20* 26  GLUCOSE 275* 323* 265* 222*  BUN 54* 50* 46* 37*  CREATININE 1.99* 2.14* 1.78* 1.70*  CALCIUM 9.6 8.6* 8.8* 8.8*  MG  --   --   --  1.5*   GFR: Estimated Creatinine Clearance: 59.2 mL/min (A) (by C-G formula based on SCr of 1.7 mg/dL (H)). Liver Function Tests: No results for input(s): AST, ALT, ALKPHOS, BILITOT, PROT, ALBUMIN in the last 168 hours. No results for input(s): LIPASE, AMYLASE in the last 168 hours. No results for input(s): AMMONIA in the last 168 hours. Coagulation Profile: No results for input(s): INR, PROTIME in the last 168 hours. Cardiac Enzymes: No results for input(s): CKTOTAL, CKMB, CKMBINDEX, TROPONINI in the last 168 hours. BNP (last 3 results) No results for input(s): PROBNP in the last 8760 hours. HbA1C: Recent Labs    09/04/19 1912  HGBA1C 8.9*   CBG: Recent Labs  Lab 09/05/19 0805 09/05/19 1153 09/05/19 1644 09/05/19 2145 09/06/19 0723  GLUCAP 297* 271* 132* 187* 213*   Lipid Profile: No results for input(s): CHOL, HDL, LDLCALC, TRIG, CHOLHDL, LDLDIRECT in the last 72 hours. Thyroid Function Tests: No results for input(s): TSH, T4TOTAL, FREET4, T3FREE, THYROIDAB in the last 72 hours. Anemia Panel: No results for input(s): VITAMINB12, FOLATE,  FERRITIN, TIBC, IRON, RETICCTPCT in the last 72 hours. Sepsis Labs: Recent Labs  Lab 09/04/19 1749 09/04/19 1922  LATICACIDVEN 2.2* 2.5*    Recent Results (from the past 240 hour(s))  Urine Culture     Status: Abnormal (Preliminary result)   Collection Time: 09/04/19  4:09 PM   Specimen: Urine, Random  Result Value Ref Range Status   Specimen Description   Final    URINE, RANDOM Performed at [facility], [address], [place], [place] [address]    Special  Requests   Final    NONE Performed at [facility], [address] [place], [place] [address]    Culture >=100,000 COLONIES/mL GRAM NEGATIVE RODS (A)  Final   Report Status PENDING  Incomplete  Blood culture (routine x 2)     Status: None (Preliminary result)   Collection Time: 09/04/19  6:38 PM   Specimen: BLOOD  Result Value Ref Range Status   Specimen Description BLOOD LEFT ANTECUBITAL  Final   Special Requests   Final    BOTTLES DRAWN AEROBIC AND ANAEROBIC Blood Culture results may not be optimal due to an excessive volume of blood received in culture bottles   Culture   Final    NO GROWTH 2 DAYS Performed at [facility], [address], [place], [place] [address]    Report Status PENDING  Incomplete  Blood culture (routine x 2)     Status: None (Preliminary result)   Collection Time: 09/04/19  6:43 PM   Specimen: BLOOD  Result Value Ref Range Status   Specimen Description BLOOD RIGHT ANTECUBITAL  Final   Special Requests   Final    BOTTLES DRAWN AEROBIC AND ANAEROBIC Blood Culture results may not be optimal due to an excessive volume of blood received in culture bottles   Culture   Final    NO GROWTH 2 DAYS Performed at [facility], [address], [place], [place] [address]    Report Status PENDING  Incomplete  SARS Coronavirus 2 by RT PCR (hospital order, performed in [place] hospital lab) Nasopharyngeal Nasopharyngeal Swab     Status: None   Collection Time:  09/04/19  6:43 PM   Specimen: Nasopharyngeal Swab  Result Value Ref Range Status   SARS Coronavirus 2 NEGATIVE NEGATIVE Final    Comment: (NOTE) SARS-CoV-2 target nucleic acids are NOT DETECTED. The SARS-CoV-2 RNA is generally detectable in upper and lower respiratory specimens during the acute phase of infection. The lowest concentration of SARS-CoV-2 viral copies this assay can detect is 250 copies / mL. A negative result does not preclude SARS-CoV-2 infection and should not be used as the sole basis for treatment or other patient management decisions.  A negative result may occur with improper specimen collection / handling, submission of specimen other than nasopharyngeal swab, presence of viral mutation(s) within the areas targeted by this assay, and inadequate number of viral copies (<250 copies / mL). A negative result must be combined with clinical observations, patient history, and epidemiological information. Fact Sheet for Patients:   StrictlyIdeas.no Fact Sheet for Healthcare Providers: BankingDealers.co.za This test is not yet approved or cleared  by the [place] FDA and has been authorized for detection and/or diagnosis of SARS-CoV-2 by FDA under an Emergency Use Authorization (EUA).  This EUA will remain in effect (meaning this test can be used) for the duration of the COVID-19 declaration under Section 564(b)(1) of the Act, 21 U.S.C. section 360bbb-3(b)(1), unless the authorization is terminated or revoked sooner. Performed at [facility], [address], [place], [place] [address]          Radiology Studies: CT Renal Stone Study  Result Date: 09/04/2019 CLINICAL DATA:  Left flank pain for the past week with fever and tachycardia. EXAM: CT ABDOMEN AND PELVIS WITHOUT CONTRAST TECHNIQUE: Multidetector CT imaging of the abdomen and pelvis was performed following the standard protocol without IV contrast.  COMPARISON:  CT abdomen dated June 12, 2018. CT abdomen and pelvis dated July 24, 2014. FINDINGS:  Lower chest: No acute abnormality. Minimal subsegmental atelectasis. Hepatobiliary: Diffuse hepatic steatosis. No focal liver abnormality. The gallbladder is unremarkable. No biliary dilatation. Pancreas: Unremarkable. No pancreatic ductal dilatation or surrounding inflammatory changes. Spleen: Normal in size without focal abnormality. Adrenals/Urinary Tract: The adrenal glands are unremarkable. Bilateral renal calculi and simple cysts again noted. New small focus of air within the right and left renal collecting systems (series 2, image 43; series 5, image 110). No hydronephrosis. Chronic bilateral perinephric fat stranding. The bladder is unremarkable. Stomach/Bowel: Stomach is within normal limits. Appendix appears normal. No evidence of bowel wall thickening, distention, or inflammatory changes. Vascular/Lymphatic: Aortic atherosclerosis. No enlarged abdominal or pelvic lymph nodes. Reproductive: Prostate is unremarkable. Other: Small to moderate left and small right fat containing inguinal hernias. Prior right inguinal hernia repair. No free fluid or pneumoperitoneum. Musculoskeletal: No acute or significant osseous findings. IMPRESSION: 1. New small foci of air within the bilateral renal collecting systems, consistent with early emphysematous pyelitis. 2. Bilateral nonobstructive nephrolithiasis. 3. Hepatic steatosis. 4. Aortic Atherosclerosis (ICD10-I70.0). Electronically Signed   By: Titus Dubin M.D.   On: 09/04/2019 18:13        Scheduled Meds:  aspirin  81 mg Oral Daily   atorvastatin  40 mg Oral QHS   docusate sodium  100 mg Oral BID   DULoxetine  60 mg Oral QHS   enoxaparin (LOVENOX) injection  40 mg Subcutaneous Q24H   gabapentin  300 mg Oral TID   HYDROcodone-acetaminophen  1 tablet Oral BID   insulin aspart  0-20 Units Subcutaneous TID WC   insulin glargine  10 Units  Subcutaneous QHS   metoprolol succinate  50 mg Oral Daily   patiromer  16.8 g Oral Daily   tamsulosin  0.4 mg Oral QPC supper   Continuous Infusions:  sodium chloride     cefTRIAXone (ROCEPHIN)  IV 2 g (09/05/19 1655)   magnesium sulfate bolus IVPB       LOS: 1 day    Time spent: 28 minutes    Sharen Hones, MD [facility]   To contact the attending provider between 7A-7P or the covering provider during after hours 7P-7A, please log into the web site [email or web address] and access using universal [place] password for that web site. If you do not have the password, please call the hospital operator.  09/06/2019, 8:33 AM

## 2019-09-06 NOTE — Progress Notes (Signed)
Urology Inpatient Progress Report  Pyelonephritis [N12] Sepsis ([place]) [A41.9]        Intv/Subj: No acute events overnight. Patient is without complaint except for some intermittent flank discomfort.  He has some mild tachycardia with a maximum temperature of 99.9.  Urine culture growing E. coli.  Blood culture negative to date.  White blood cell count has normalized.  Creatinine improving and is 1.7.  Continues to have elevated blood glucose and is working with the diabetes coordinator.  Active Problems:   Diabetes mellitus type 2 in obese (HCC)   Gout   Essential hypertension   Hyperlipidemia   Depression   CKD stage G3b/A2, GFR 30-44 and albumin creatinine ratio 30-299 mg/g   Emphysematous pyelitis   Pyelonephritis   Sepsis ([place])  Current Facility-Administered Medications  Medication Dose Route Frequency Provider Last Rate Last Admin  . 0.9 %  sodium chloride infusion   Intravenous Continuous Sharen Hones, MD 100 mL/hr at 09/06/19 0846 New Bag at 09/06/19 0846  . acetaminophen (TYLENOL) tablet 325 mg  325 mg Oral Q4H PRN Neena Rhymes, MD   325 mg at 09/04/19 2152  . aspirin chewable tablet 81 mg  81 mg Oral Daily Neena Rhymes, MD   81 mg at 09/06/19 0849  . atorvastatin (LIPITOR) tablet 40 mg  40 mg Oral QHS Norins, Heinz Knuckles, MD   40 mg at 09/05/19 2050  . cefTRIAXone (ROCEPHIN) 2 g in sodium chloride 0.9 % 100 mL IVPB  2 g Intravenous Q24H Sharen Hones, MD 200 mL/hr at 09/05/19 1655 2 g at 09/05/19 1655  . docusate sodium (COLACE) capsule 100 mg  100 mg Oral BID Neena Rhymes, MD   100 mg at 09/06/19 0849  . DULoxetine (CYMBALTA) DR capsule 60 mg  60 mg Oral QHS Norins, Heinz Knuckles, MD   60 mg at 09/05/19 2050  . enoxaparin (LOVENOX) injection 40 mg  40 mg Subcutaneous Q24H Norins, Heinz Knuckles, MD   40 mg at 09/05/19 2051  . fluticasone (FLONASE) 50 MCG/ACT nasal spray 2 spray  2 spray Each Nare Daily PRN Oswald Hillock, RPH      . gabapentin (NEURONTIN) capsule 300  mg  300 mg Oral TID Neena Rhymes, MD   300 mg at 09/06/19 0849  . HYDROcodone-acetaminophen (NORCO) 10-325 MG per tablet 1 tablet  1 tablet Oral BID Norins, Heinz Knuckles, MD   1 tablet at 09/06/19 1044  . insulin aspart (novoLOG) injection 0-20 Units  0-20 Units Subcutaneous TID WC Norins, Heinz Knuckles, MD   7 Units at 09/06/19 1217  . insulin glargine (LANTUS) injection 10 Units  10 Units Subcutaneous QHS Norins, Heinz Knuckles, MD   10 Units at 09/05/19 2050  . metoprolol succinate (TOPROL-XL) 24 hr tablet 50 mg  50 mg Oral Daily Norins, Heinz Knuckles, MD   50 mg at 09/06/19 0850  . oxyCODONE-acetaminophen (PERCOCET/ROXICET) 5-325 MG per tablet 1 tablet  1 tablet Oral Q4H PRN Sharen Hones, MD   1 tablet at 09/06/19 1336  . patiromer Daryll Drown) packet 16.8 g  16.8 g Oral Daily Norins, Heinz Knuckles, MD   16.8 g at 09/05/19 1007  . tamsulosin (FLOMAX) capsule 0.4 mg  0.4 mg Oral QPC supper Neena Rhymes, MD   0.4 mg at 09/05/19 1656  . tiZANidine (ZANAFLEX) tablet 4 mg  4 mg Oral Q8H PRN Norins, Heinz Knuckles, MD         Objective: Vital: Vitals:   09/05/19 1656 09/05/19  2345 09/06/19 0724 09/06/19 0850  BP: (!) 159/79 137/68 138/66 140/65  Pulse: (!) 102 (!) 110 (!) 102 99  Resp: 16 18    Temp: 99.2 F (37.3 C) 99.9 F (37.7 C) 99 F (37.2 C)   TempSrc: Oral Oral Oral   SpO2: 100% 99% 95%   Weight:      Height:       I/Os: I/O last 3 completed shifts: In: 1601.6 [P.O.:240; I.V.:361.6; IV Piggyback:1000] Out: 800 [Urine:800]  Physical Exam:  General: Patient is in no apparent distress Lungs: Normal respiratory effort, chest expands symmetrically. GI: The abdomen is soft and nontender without mass.  There is no CVA tenderness Ext: lower extremities symmetric  Lab Results: Recent Labs    09/04/19 1532 09/05/19 0010 09/06/19 0619  WBC 11.2* 15.3* 7.6  HGB 13.0 12.0* 10.7*  HCT 38.1* 36.5* 32.2*   Recent Labs    09/05/19 0010 09/05/19 1159 09/06/19 0619  NA 137 135 137  K 5.5*  4.9 4.8  CL 110 105 100  CO2 16* 20* 26  GLUCOSE 323* 265* 222*  BUN 50* 46* 37*  CREATININE 2.14* 1.78* 1.70*  CALCIUM 8.6* 8.8* 8.8*   No results for input(s): LABPT, INR in the last 72 hours. No results for input(s): LABURIN in the last 72 hours. Results for orders placed or performed during the hospital encounter of 09/04/19  Urine Culture     Status: Abnormal (Preliminary result)   Collection Time: 09/04/19  4:09 PM   Specimen: Urine, Random  Result Value Ref Range Status   Specimen Description   Final    URINE, RANDOM Performed at [facility], [address] [place], [place] [address]    Special Requests   Final    NONE Performed at [facility], [address] [place], [place] [address]    Culture (A)  Final    >=100,000 COLONIES/mL ESCHERICHIA COLI SUSCEPTIBILITIES TO FOLLOW Performed at [facility], [place] [address] [place], [place] [address]    Report Status PENDING  Incomplete  Blood culture (routine x 2)     Status: None (Preliminary result)   Collection Time: 09/04/19  6:38 PM   Specimen: BLOOD  Result Value Ref Range Status   Specimen Description BLOOD LEFT ANTECUBITAL  Final   Special Requests   Final    BOTTLES DRAWN AEROBIC AND ANAEROBIC Blood Culture results may not be optimal due to an excessive volume of blood received in culture bottles   Culture   Final    NO GROWTH 2 DAYS Performed at [facility], [address], [place], [place] [address]    Report Status PENDING  Incomplete  Blood culture (routine x 2)     Status: None (Preliminary result)   Collection Time: 09/04/19  6:43 PM   Specimen: BLOOD  Result Value Ref Range Status   Specimen Description BLOOD RIGHT ANTECUBITAL  Final   Special Requests   Final    BOTTLES DRAWN AEROBIC AND ANAEROBIC Blood Culture results may not be optimal due to an excessive volume of blood received in culture bottles   Culture   Final    NO GROWTH 2 DAYS Performed at [facility], [address] [place], [place] [address]    Report Status PENDING  Incomplete  SARS Coronavirus 2 by RT PCR (hospital order, performed in [place] hospital lab) Nasopharyngeal Nasopharyngeal Swab     Status: None   Collection Time: 09/04/19  6:43  PM   Specimen: Nasopharyngeal Swab  Result Value Ref Range Status   SARS Coronavirus 2 NEGATIVE NEGATIVE Final    Comment: (NOTE) SARS-CoV-2 target nucleic acids are NOT DETECTED. The SARS-CoV-2 RNA is generally detectable in upper and lower respiratory specimens during the acute phase of infection. The lowest concentration of SARS-CoV-2 viral copies this assay can detect is 250 copies / mL. A negative result does not preclude SARS-CoV-2 infection and should not be used as the sole basis for treatment or other patient management decisions.  A negative result may occur with improper specimen collection / handling, submission of specimen other than nasopharyngeal swab, presence of viral mutation(s) within the areas targeted by this assay, and inadequate number of viral copies (<250 copies / mL). A negative result must be combined with clinical observations, patient history, and epidemiological information. Fact Sheet for Patients:   StrictlyIdeas.no Fact Sheet for Healthcare Providers: BankingDealers.co.za This test is not yet approved or cleared  by the [place] FDA and has been authorized for detection and/or diagnosis of SARS-CoV-2 by FDA under an Emergency Use Authorization (EUA).  This EUA will remain in effect (meaning this test can be used) for the duration of the COVID-19 declaration under Section 564(b)(1) of the Act, 21 U.S.C. section 360bbb-3(b)(1), unless the authorization is terminated or revoked sooner. Performed at [facility], [address] [place], [place] [address]     Studies/Results: CT Renal Stone Study  Result Date:  09/04/2019 CLINICAL DATA:  Left flank pain for the past week with fever and tachycardia. EXAM: CT ABDOMEN AND PELVIS WITHOUT CONTRAST TECHNIQUE: Multidetector CT imaging of the abdomen and pelvis was performed following the standard protocol without IV contrast. COMPARISON:  CT abdomen dated June 12, 2018. CT abdomen and pelvis dated July 24, 2014. FINDINGS: Lower chest: No acute abnormality. Minimal subsegmental atelectasis. Hepatobiliary: Diffuse hepatic steatosis. No focal liver abnormality. The gallbladder is unremarkable. No biliary dilatation. Pancreas: Unremarkable. No pancreatic ductal dilatation or surrounding inflammatory changes. Spleen: Normal in size without focal abnormality. Adrenals/Urinary Tract: The adrenal glands are unremarkable. Bilateral renal calculi and simple cysts again noted. New small focus of air within the right and left renal collecting systems (series 2, image 43; series 5, image 110). No hydronephrosis. Chronic bilateral perinephric fat stranding. The bladder is unremarkable. Stomach/Bowel: Stomach is within normal limits. Appendix appears normal. No evidence of bowel wall thickening, distention, or inflammatory changes. Vascular/Lymphatic: Aortic atherosclerosis. No enlarged abdominal or pelvic lymph nodes. Reproductive: Prostate is unremarkable. Other: Small to moderate left and small right fat containing inguinal hernias. Prior right inguinal hernia repair. No free fluid or pneumoperitoneum. Musculoskeletal: No acute or significant osseous findings. IMPRESSION: 1. New small foci of air within the bilateral renal collecting systems, consistent with early emphysematous pyelitis. 2. Bilateral nonobstructive nephrolithiasis. 3. Hepatic steatosis. 4. Aortic Atherosclerosis (ICD10-I70.0). Electronically Signed   By: Titus Dubin M.D.   On: 09/04/2019 18:13    Assessment: Emphysematous pyelitis Flank pain  Plan: Agree with continuing ceftriaxone until cultures return.   Anticipate he will be able to be discharged once final culture returns.   Link Snuffer, MD Urology 09/06/2019, 1:52 PM

## 2019-09-06 NOTE — Plan of Care (Signed)
  Problem: Education: Goal: Knowledge of General Education information will improve Description Including pain rating scale, medication(s)/side effects and non-pharmacologic comfort measures Outcome: Progressing   

## 2019-09-07 DIAGNOSIS — N171 Acute kidney failure with acute cortical necrosis: Secondary | ICD-10-CM

## 2019-09-07 DIAGNOSIS — N1831 Chronic kidney disease, stage 3a: Secondary | ICD-10-CM

## 2019-09-07 DIAGNOSIS — E1165 Type 2 diabetes mellitus with hyperglycemia: Secondary | ICD-10-CM

## 2019-09-07 DIAGNOSIS — N12 Tubulo-interstitial nephritis, not specified as acute or chronic: Secondary | ICD-10-CM

## 2019-09-07 DIAGNOSIS — A419 Sepsis, unspecified organism: Secondary | ICD-10-CM

## 2019-09-07 LAB — CBC WITH DIFFERENTIAL/PLATELET
Abs Immature Granulocytes: 0.02 10*3/uL (ref 0.00–0.07)
Basophils Absolute: 0 10*3/uL (ref 0.0–0.1)
Basophils Relative: 1 %
Eosinophils Absolute: 0.2 10*3/uL (ref 0.0–0.5)
Eosinophils Relative: 4 %
HCT: 31.3 % — ABNORMAL LOW (ref 39.0–52.0)
Hemoglobin: 10.5 g/dL — ABNORMAL LOW (ref 13.0–17.0)
Immature Granulocytes: 0 %
Lymphocytes Relative: 25 %
Lymphs Abs: 1.2 10*3/uL (ref 0.7–4.0)
MCH: 32.2 pg (ref 26.0–34.0)
MCHC: 33.5 g/dL (ref 30.0–36.0)
MCV: 96 fL (ref 80.0–100.0)
Monocytes Absolute: 0.6 10*3/uL (ref 0.1–1.0)
Monocytes Relative: 12 %
Neutro Abs: 2.9 10*3/uL (ref 1.7–7.7)
Neutrophils Relative %: 58 %
Platelets: 182 10*3/uL (ref 150–400)
RBC: 3.26 MIL/uL — ABNORMAL LOW (ref 4.22–5.81)
RDW: 12.9 % (ref 11.5–15.5)
WBC: 5 10*3/uL (ref 4.0–10.5)
nRBC: 0 % (ref 0.0–0.2)

## 2019-09-07 LAB — BASIC METABOLIC PANEL
Anion gap: 9 (ref 5–15)
BUN: 28 mg/dL — ABNORMAL HIGH (ref 8–23)
CO2: 27 mmol/L (ref 22–32)
Calcium: 8.6 mg/dL — ABNORMAL LOW (ref 8.9–10.3)
Chloride: 101 mmol/L (ref 98–111)
Creatinine, Ser: 1.4 mg/dL — ABNORMAL HIGH (ref 0.61–1.24)
GFR calc Af Amer: 59 mL/min — ABNORMAL LOW (ref >60)
GFR calc non Af Amer: 51 mL/min — ABNORMAL LOW (ref >60)
Glucose, Bld: 230 mg/dL — ABNORMAL HIGH (ref 70–99)
Potassium: 4 mmol/L (ref 3.5–5.1)
Sodium: 137 mmol/L (ref 135–145)

## 2019-09-07 LAB — URINE CULTURE: Culture: >=100000 — AB

## 2019-09-07 LAB — GLUCOSE, CAPILLARY
Glucose-Capillary: 216 mg/dL — ABNORMAL HIGH (ref 70–99)
Glucose-Capillary: 282 mg/dL — ABNORMAL HIGH (ref 70–99)
Glucose-Capillary: 191 mg/dL — ABNORMAL HIGH (ref 70–99)
Glucose-Capillary: 173 mg/dL — ABNORMAL HIGH (ref 70–99)

## 2019-09-07 LAB — MAGNESIUM: Magnesium: 2 mg/dL (ref 1.7–2.4)

## 2019-09-07 MED ORDER — INSULIN GLARGINE 100 UNIT/ML ~~LOC~~ SOLN
16.0000 [IU] | Freq: Every day | SUBCUTANEOUS | Status: DC
  Administered 2019-09-07: 16 [IU] via SUBCUTANEOUS
  Filled 2019-09-07 (×2): qty 0.16

## 2019-09-07 NOTE — Progress Notes (Signed)
PROGRESS NOTE    Evan Rivas  ZJQ:[id number] DOB: 1950/10/10 DOA: 09/04/2019 PCP: Hubbard Hartshorn, FNP   Chief complaint.  Follow-up for pyelonephritis.  Brief Narrative: (Start on day 1 of progress note - keep it brief and live) Patient is a 69 year old male with history of type 2 diabetes, hypertension, obstructive apnea, history of nephrolithiasis, benign prostate hypertrophy and depression who presents to the emergency room complaining of bilateral flank pain. Symptoms started about a week ago. He was nauseated yesterday, he started having fever in the emergency room. He had a mild leukocytosis, he had acute renal failure with metabolic acidosis, hyperkalemia and as well as lactic acidosis of 2.5. CT scan showed no obstructing nephrolithiasis as well as small areas in bilateral renal collecting systems consistent with emphysematous pyelitis.Urology consult was obtained from the emergency room. Patient is covered with broad-spectrum antibiotics with Rocephin 2 g daily.  6/5.  Urine culture grow gram negative rods, blood culture still negative today.  Rocephin continued.  Creatinine level dropped down to 1.7 with a baseline of 1.4.  6/6.  Urine culture came back with E. coli, pansensitive.  Continue Rocephin for 1 more day, plan treated with Cipro orally for total antibiotics 14 days at discharge tomorrow.   Assessment & Plan:   Active Problems:   Diabetes mellitus type 2 in obese (HCC)   Gout   Essential hypertension   Hyperlipidemia   Depression   CKD stage G3b/A2, GFR 30-44 and albumin creatinine ratio 30-299 mg/g   Emphysematous pyelitis   Pyelonephritis   Sepsis ([place])  #1.  Sepsis secondary to acute pyelonephritis and emphysematous pyelitis. Condition much improved.  Urine culture came back with E. coli pansensitive.  We will continue Rocephin for 1 more day.  Switch to oral Cipro at time of discharge tomorrow.  Discontinue IV fluids.  Blood culture still  negative.  #2.  Acute renal failure on chronic kidney disease stage IIIa.  With normal anion gap metabolic acidosis and hyperkalemia. Condition had improved.  Renal function back to baseline.  Discontinue fluids.  3.  Uncontrolled type 2 diabetes with hyperglycemia. Increase Lantus dose.  4.  Essential hypertension. Continue current regimen.  5.  Chronic back pain.  DVT prophylaxis:Lovenox Code Status:Full Family Communication:None in room. Disposition Plan:  Patient came from:Home  Anticipated d/c place:Home  Barriers to d/c OR conditions which need to be met to effect a safe d/c:   Consultants:  urology  Procedures:None Antimicrobials: Rocephin.   Subjective: Patient condition markedly improved.   He had an episode of vomiting last night after eating a salad at the dinnertime.  No additional nausea vomiting today. No dysuria hematuria. No fever chills. He had a normal bowel movement.  Objective: Vitals:   09/06/19 0850 09/06/19 1532 09/06/19 2329 09/07/19 0836  BP: 140/65 134/83 (!) 158/77 (!) 144/75  Pulse: 99 93 89 95  Resp:   17 17  Temp:  98.6 F (37 C) 99.4 F (37.4 C) 98.4 F (36.9 C)  TempSrc:  Oral Oral Oral  SpO2:  96% 98% 98%  Weight:      Height:        Intake/Output Summary (Last 24 hours) at 09/07/2019 0844 Last data filed at 09/06/2019 1600 Gross per 24 hour  Intake 366.25 ml  Output 500 ml  Net -133.75 ml   Filed Weights   09/04/19 1531 09/05/19 0017  Weight: 127 kg 124.8 kg    Examination:  General exam: Appears calm and comfortable  Respiratory  system: Clear to auscultation. Respiratory effort normal. Cardiovascular system: S1 & S2 heard, RRR. No JVD, murmurs, rubs, gallops or clicks. No pedal edema. Gastrointestinal system: Abdomen is nondistended, soft and nontender. No organomegaly or masses felt. Normal  bowel sounds heard. Central nervous system: Alert and oriented. No focal neurological deficits. Extremities: Symmetric 5 x 5 power. Skin: No rashes, lesions or ulcers Psychiatry: Judgement and insight appear normal. Mood & affect appropriate.     Data Reviewed: I have personally reviewed following labs and imaging studies  CBC: Recent Labs  Lab 09/04/19 1532 09/05/19 0010 09/06/19 0619 09/07/19 0721  WBC 11.2* 15.3* 7.6 5.0  NEUTROABS  --   --  5.5 2.9  HGB 13.0 12.0* 10.7* 10.5*  HCT 38.1* 36.5* 32.2* 31.3*  MCV 95.7 99.5 98.2 96.0  PLT 221 233 174 448   Basic Metabolic Panel: Recent Labs  Lab 09/04/19 1532 09/05/19 0010 09/05/19 1159 09/06/19 0619 09/07/19 0721  NA 138 137 135 137 137  K 5.6* 5.5* 4.9 4.8 4.0  CL 106 110 105 100 101  CO2 22 16* 20* 26 27  GLUCOSE 275* 323* 265* 222* 230*  BUN 54* 50* 46* 37* 28*  CREATININE 1.99* 2.14* 1.78* 1.70* 1.40*  CALCIUM 9.6 8.6* 8.8* 8.8* 8.6*  MG  --   --   --  1.5* 2.0   GFR: Estimated Creatinine Clearance: 71.9 mL/min (A) (by C-G formula based on SCr of 1.4 mg/dL (H)). Liver Function Tests: No results for input(s): AST, ALT, ALKPHOS, BILITOT, PROT, ALBUMIN in the last 168 hours. No results for input(s): LIPASE, AMYLASE in the last 168 hours. No results for input(s): AMMONIA in the last 168 hours. Coagulation Profile: No results for input(s): INR, PROTIME in the last 168 hours. Cardiac Enzymes: No results for input(s): CKTOTAL, CKMB, CKMBINDEX, TROPONINI in the last 168 hours. BNP (last 3 results) No results for input(s): PROBNP in the last 8760 hours. HbA1C: Recent Labs    09/04/19 1912  HGBA1C 8.9*   CBG: Recent Labs  Lab 09/06/19 0723 09/06/19 1123 09/06/19 1647 09/06/19 2116 09/07/19 0745  GLUCAP 213* 208* 238* 260* 216*   Lipid Profile: No results for input(s): CHOL, HDL, LDLCALC, TRIG, CHOLHDL, LDLDIRECT in the last 72 hours. Thyroid Function Tests: No results for input(s): TSH, T4TOTAL,  FREET4, T3FREE, THYROIDAB in the last 72 hours. Anemia Panel: No results for input(s): VITAMINB12, FOLATE, FERRITIN, TIBC, IRON, RETICCTPCT in the last 72 hours. Sepsis Labs: Recent Labs  Lab 09/04/19 1749 09/04/19 1922  LATICACIDVEN 2.2* 2.5*    Recent Results (from the past 240 hour(s))  Urine Culture     Status: Abnormal   Collection Time: 09/04/19  4:09 PM   Specimen: Urine, Random  Result Value Ref Range Status   Specimen Description   Final    URINE, RANDOM Performed at [facility], [address], [place], [place] [address]    Special Requests   Final    NONE Performed at [facility], [address] [place], [place] [address]    Culture >=100,000 COLONIES/mL ESCHERICHIA COLI (A)  Final   Report Status 09/07/2019 FINAL  Final   Organism ID, Bacteria ESCHERICHIA COLI (A)  Final      Susceptibility   Escherichia coli - MIC*    AMPICILLIN <=2 SENSITIVE Sensitive     CEFAZOLIN <=4 SENSITIVE Sensitive     CEFTRIAXONE <=1 SENSITIVE Sensitive     CIPROFLOXACIN <=0.25 SENSITIVE Sensitive     GENTAMICIN <=1 SENSITIVE Sensitive  IMIPENEM <=0.25 SENSITIVE Sensitive     NITROFURANTOIN <=16 SENSITIVE Sensitive     TRIMETH/SULFA <=20 SENSITIVE Sensitive     AMPICILLIN/SULBACTAM <=2 SENSITIVE Sensitive     PIP/TAZO <=4 SENSITIVE Sensitive     * >=100,000 COLONIES/mL ESCHERICHIA COLI  Blood culture (routine x 2)     Status: None (Preliminary result)   Collection Time: 09/04/19  6:38 PM   Specimen: BLOOD  Result Value Ref Range Status   Specimen Description BLOOD LEFT ANTECUBITAL  Final   Special Requests   Final    BOTTLES DRAWN AEROBIC AND ANAEROBIC Blood Culture results may not be optimal due to an excessive volume of blood received in culture bottles   Culture   Final    NO GROWTH 3 DAYS Performed at [facility], [address] [place], [place] [address]    Report Status PENDING  Incomplete  Blood culture (routine x 2)     Status:  None (Preliminary result)   Collection Time: 09/04/19  6:43 PM   Specimen: BLOOD  Result Value Ref Range Status   Specimen Description BLOOD RIGHT ANTECUBITAL  Final   Special Requests   Final    BOTTLES DRAWN AEROBIC AND ANAEROBIC Blood Culture results may not be optimal due to an excessive volume of blood received in culture bottles   Culture   Final    NO GROWTH 3 DAYS Performed at [facility], [address] [place], [place] [address]    Report Status PENDING  Incomplete  SARS Coronavirus 2 by RT PCR (hospital order, performed in [place] hospital lab) Nasopharyngeal Nasopharyngeal Swab     Status: None   Collection Time: 09/04/19  6:43 PM   Specimen: Nasopharyngeal Swab  Result Value Ref Range Status   SARS Coronavirus 2 NEGATIVE NEGATIVE Final    Comment: (NOTE) SARS-CoV-2 target nucleic acids are NOT DETECTED. The SARS-CoV-2 RNA is generally detectable in upper and lower respiratory specimens during the acute phase of infection. The lowest concentration of SARS-CoV-2 viral copies this assay can detect is 250 copies / mL. A negative result does not preclude SARS-CoV-2 infection and should not be used as the sole basis for treatment or other patient management decisions.  A negative result may occur with improper specimen collection / handling, submission of specimen other than nasopharyngeal swab, presence of viral mutation(s) within the areas targeted by this assay, and inadequate number of viral copies (<250 copies / mL). A negative result must be combined with clinical observations, patient history, and epidemiological information. Fact Sheet for Patients:   StrictlyIdeas.no Fact Sheet for Healthcare Providers: BankingDealers.co.za This test is not yet approved or cleared  by the [place] FDA and has been authorized for detection and/or diagnosis of SARS-CoV-2 by FDA under an Emergency Use Authorization  (EUA).  This EUA will remain in effect (meaning this test can be used) for the duration of the COVID-19 declaration under Section 564(b)(1) of the Act, 21 U.S.C. section 360bbb-3(b)(1), unless the authorization is terminated or revoked sooner. Performed at [facility], [address], [place], [place] [address]          Radiology Studies: No results found.      Scheduled Meds: . aspirin  81 mg Oral Daily  . atorvastatin  40 mg Oral QHS  . docusate sodium  100 mg Oral BID  . DULoxetine  60 mg Oral QHS  . enoxaparin (LOVENOX) injection  40 mg Subcutaneous Q24H  . gabapentin  300 mg  Oral TID  . HYDROcodone-acetaminophen  1 tablet Oral BID  . insulin aspart  0-20 Units Subcutaneous TID WC  . insulin glargine  16 Units Subcutaneous QHS  . metoprolol succinate  50 mg Oral Daily  . tamsulosin  0.4 mg Oral QPC supper   Continuous Infusions: . cefTRIAXone (ROCEPHIN)  IV 2 g (09/06/19 1751)     LOS: 2 days    Time spent: 28 minutes    Sharen Hones, MD [facility]   To contact the attending provider between 7A-7P or the covering provider during after hours 7P-7A, please log into the web site [email or web address] and access using universal [place] password for that web site. If you do not have the password, please call the hospital operator.  09/07/2019, 8:44 AM

## 2019-09-07 NOTE — Progress Notes (Signed)
Urology Inpatient Progress Report  Pyelonephritis [N12] Sepsis ([place]) [A41.9]        Intv/Subj: No acute events overnight. Patient is without complaint.  Urine culture grew pansensitive E. coli.  Active Problems:   Diabetes mellitus type 2 in obese (HCC)   Gout   Essential hypertension   Hyperlipidemia   Depression   CKD stage G3b/A2, GFR 30-44 and albumin creatinine ratio 30-299 mg/g   Emphysematous pyelitis   Pyelonephritis   Sepsis ([place])   Acute renal failure with acute renal cortical necrosis superimposed on stage 3a chronic kidney disease (HCC)  Current Facility-Administered Medications  Medication Dose Route Frequency Provider Last Rate Last Admin   acetaminophen (TYLENOL) tablet 325 mg  325 mg Oral Q4H PRN Norins, Heinz Knuckles, MD   325 mg at 09/04/19 2152   aspirin chewable tablet 81 mg  81 mg Oral Daily Norins, Heinz Knuckles, MD   81 mg at 09/07/19 0840   atorvastatin (LIPITOR) tablet 40 mg  40 mg Oral QHS Norins, Heinz Knuckles, MD   40 mg at 09/06/19 2130   cefTRIAXone (ROCEPHIN) 2 g in sodium chloride 0.9 % 100 mL IVPB  2 g Intravenous Q24H Sharen Hones, MD 200 mL/hr at 09/06/19 1751 2 g at 09/06/19 1751   docusate sodium (COLACE) capsule 100 mg  100 mg Oral BID Neena Rhymes, MD   100 mg at 09/07/19 6761   DULoxetine (CYMBALTA) DR capsule 60 mg  60 mg Oral QHS Neena Rhymes, MD   60 mg at 09/06/19 2130   enoxaparin (LOVENOX) injection 40 mg  40 mg Subcutaneous Q24H Norins, Heinz Knuckles, MD   40 mg at 09/06/19 2130   fluticasone (FLONASE) 50 MCG/ACT nasal spray 2 spray  2 spray Each Nare Daily PRN Oswald Hillock, RPH       gabapentin (NEURONTIN) capsule 300 mg  300 mg Oral TID Neena Rhymes, MD   300 mg at 09/07/19 0838   HYDROcodone-acetaminophen (NORCO) 10-325 MG per tablet 1 tablet  1 tablet Oral BID Neena Rhymes, MD   1 tablet at 09/07/19 0839   insulin aspart (novoLOG) injection 0-20 Units  0-20 Units Subcutaneous TID WC Norins, Heinz Knuckles, MD   11  Units at 09/07/19 1215   insulin glargine (LANTUS) injection 16 Units  16 Units Subcutaneous QHS Sharen Hones, MD       metoprolol succinate (TOPROL-XL) 24 hr tablet 50 mg  50 mg Oral Daily Norins, Heinz Knuckles, MD   50 mg at 09/07/19 9509   oxyCODONE-acetaminophen (PERCOCET/ROXICET) 5-325 MG per tablet 1 tablet  1 tablet Oral Q4H PRN Sharen Hones, MD   1 tablet at 09/07/19 1215   tamsulosin (FLOMAX) capsule 0.4 mg  0.4 mg Oral QPC supper Neena Rhymes, MD   0.4 mg at 09/06/19 1747   tiZANidine (ZANAFLEX) tablet 4 mg  4 mg Oral Q8H PRN Neena Rhymes, MD         Objective: Vital: Vitals:   09/06/19 0850 09/06/19 1532 09/06/19 2329 09/07/19 0836  BP: 140/65 134/83 (!) 158/77 (!) 144/75  Pulse: 99 93 89 95  Resp:   17 17  Temp:  98.6 F (37 C) 99.4 F (37.4 C) 98.4 F (36.9 C)  TempSrc:  Oral Oral Oral  SpO2:  96% 98% 98%  Weight:      Height:       I/Os: I/O last 3 completed shifts: In: 366.3 [I.V.:266.3; IV Piggyback:100] Out: 500 [Urine:500]  Physical Exam:  General: Patient is in no apparent distress Lungs: Normal respiratory effort, chest expands symmetrically. GI:The abdomen is soft and nontender without mass. Ext: lower extremities symmetric  Lab Results: Recent Labs    09/05/19 0010 09/06/19 0619 09/07/19 0721  WBC 15.3* 7.6 5.0  HGB 12.0* 10.7* 10.5*  HCT 36.5* 32.2* 31.3*   Recent Labs    09/05/19 1159 09/06/19 0619 09/07/19 0721  NA 135 137 137  K 4.9 4.8 4.0  CL 105 100 101  CO2 20* 26 27  GLUCOSE 265* 222* 230*  BUN 46* 37* 28*  CREATININE 1.78* 1.70* 1.40*  CALCIUM 8.8* 8.8* 8.6*   No results for input(s): LABPT, INR in the last 72 hours. No results for input(s): LABURIN in the last 72 hours. Results for orders placed or performed during the hospital encounter of 09/04/19  Urine Culture     Status: Abnormal   Collection Time: 09/04/19  4:09 PM   Specimen: Urine, Random  Result Value Ref Range Status   Specimen Description   Final     URINE, RANDOM Performed at [facility], [address], [place], [place] [address]    Special Requests   Final    NONE Performed at [facility], [address] [place], [place] [address]    Culture >=100,000 COLONIES/mL ESCHERICHIA COLI (A)  Final   Report Status 09/07/2019 FINAL  Final   Organism ID, Bacteria ESCHERICHIA COLI (A)  Final      Susceptibility   Escherichia coli - MIC*    AMPICILLIN <=2 SENSITIVE Sensitive     CEFAZOLIN <=4 SENSITIVE Sensitive     CEFTRIAXONE <=1 SENSITIVE Sensitive     CIPROFLOXACIN <=0.25 SENSITIVE Sensitive     GENTAMICIN <=1 SENSITIVE Sensitive     IMIPENEM <=0.25 SENSITIVE Sensitive     NITROFURANTOIN <=16 SENSITIVE Sensitive     TRIMETH/SULFA <=20 SENSITIVE Sensitive     AMPICILLIN/SULBACTAM <=2 SENSITIVE Sensitive     PIP/TAZO <=4 SENSITIVE Sensitive     * >=100,000 COLONIES/mL ESCHERICHIA COLI  Blood culture (routine x 2)     Status: None (Preliminary result)   Collection Time: 09/04/19  6:38 PM   Specimen: BLOOD  Result Value Ref Range Status   Specimen Description BLOOD LEFT ANTECUBITAL  Final   Special Requests   Final    BOTTLES DRAWN AEROBIC AND ANAEROBIC Blood Culture results may not be optimal due to an excessive volume of blood received in culture bottles   Culture   Final    NO GROWTH 3 DAYS Performed at [facility] [address], [place], [place] [address]    Report Status PENDING  Incomplete  Blood culture (routine x 2)     Status: None (Preliminary result)   Collection Time: 09/04/19  6:43 PM   Specimen: BLOOD  Result Value Ref Range Status   Specimen Description BLOOD RIGHT ANTECUBITAL  Final   Special Requests   Final    BOTTLES DRAWN AEROBIC AND ANAEROBIC Blood Culture results may not be optimal due to an excessive volume of blood received in culture bottles   Culture   Final    NO GROWTH 3 DAYS Performed at [facility], [address] [place], [place] [address]     Report Status PENDING  Incomplete  SARS Coronavirus 2 by RT PCR (hospital order, performed in [place] hospital lab) Nasopharyngeal Nasopharyngeal Swab     Status: None   Collection Time: 09/04/19  6:43 PM   Specimen: Nasopharyngeal  Swab  Result Value Ref Range Status   SARS Coronavirus 2 NEGATIVE NEGATIVE Final    Comment: (NOTE) SARS-CoV-2 target nucleic acids are NOT DETECTED. The SARS-CoV-2 RNA is generally detectable in upper and lower respiratory specimens during the acute phase of infection. The lowest concentration of SARS-CoV-2 viral copies this assay can detect is 250 copies / mL. A negative result does not preclude SARS-CoV-2 infection and should not be used as the sole basis for treatment or other patient management decisions.  A negative result may occur with improper specimen collection / handling, submission of specimen other than nasopharyngeal swab, presence of viral mutation(s) within the areas targeted by this assay, and inadequate number of viral copies (<250 copies / mL). A negative result must be combined with clinical observations, patient history, and epidemiological information. Fact Sheet for Patients:   StrictlyIdeas.no Fact Sheet for Healthcare Providers: BankingDealers.co.za This test is not yet approved or cleared  by the [place] FDA and has been authorized for detection and/or diagnosis of SARS-CoV-2 by FDA under an Emergency Use Authorization (EUA).  This EUA will remain in effect (meaning this test can be used) for the duration of the COVID-19 declaration under Section 564(b)(1) of the Act, 21 U.S.C. section 360bbb-3(b)(1), unless the authorization is terminated or revoked sooner. Performed at [facility], [address], [place], [place] [address]     Studies/Results: No results found.  Assessment: Emphysematous pyelitis  Plan: Agree with transitioning to ciprofloxacin for  total of 14 days of antibiotic treatment.  Continue tight diabetes control.  He continues to have some problems with uncontrolled blood sugars.  This is one of the most important things to do to prevent a recurrence.   Link Snuffer, MD Urology 09/07/2019, 12:55 PM

## 2019-09-08 DIAGNOSIS — N171 Acute kidney failure with acute cortical necrosis: Secondary | ICD-10-CM

## 2019-09-08 DIAGNOSIS — N1831 Chronic kidney disease, stage 3a: Secondary | ICD-10-CM

## 2019-09-08 LAB — BASIC METABOLIC PANEL
Anion gap: 8 (ref 5–15)
BUN: 27 mg/dL — ABNORMAL HIGH (ref 8–23)
CO2: 28 mmol/L (ref 22–32)
Calcium: 8.7 mg/dL — ABNORMAL LOW (ref 8.9–10.3)
Chloride: 102 mmol/L (ref 98–111)
Creatinine, Ser: 1.44 mg/dL — ABNORMAL HIGH (ref 0.61–1.24)
GFR calc Af Amer: 57 mL/min — ABNORMAL LOW (ref >60)
GFR calc non Af Amer: 50 mL/min — ABNORMAL LOW (ref >60)
Glucose, Bld: 246 mg/dL — ABNORMAL HIGH (ref 70–99)
Potassium: 4.1 mmol/L (ref 3.5–5.1)
Sodium: 138 mmol/L (ref 135–145)

## 2019-09-08 LAB — GLUCOSE, CAPILLARY: Glucose-Capillary: 287 mg/dL — ABNORMAL HIGH (ref 70–99)

## 2019-09-08 LAB — MAGNESIUM: Magnesium: 1.9 mg/dL (ref 1.7–2.4)

## 2019-09-08 MED ORDER — CIPROFLOXACIN HCL 500 MG PO TABS
500.0000 mg | ORAL_TABLET | Freq: Two times a day (BID) | ORAL | 0 refills | Status: AC
Start: 2019-09-08 — End: 2019-09-18

## 2019-09-08 MED ORDER — INSULIN GLARGINE 100 UNIT/ML SOLOSTAR PEN
20.0000 [IU] | PEN_INJECTOR | Freq: Every day | SUBCUTANEOUS | 0 refills | Status: DC
Start: 2019-09-08 — End: 2019-09-16

## 2019-09-08 NOTE — Discharge Summary (Signed)
Physician Discharge Summary  Patient ID: Evan Rivas MRN: [id number] DOB/AGE: [birth year] 69 y.o.  Admit date: 09/04/2019 Discharge date: 09/08/2019  Admission Diagnoses:  Discharge Diagnoses:  Active Problems:   Diabetes mellitus type 2 in obese (HCC)   Gout   Essential hypertension   Hyperlipidemia   Depression   CKD stage G3b/A2, GFR 30-44 and albumin creatinine ratio 30-299 mg/g   Emphysematous pyelitis   Pyelonephritis   Sepsis (HCC)   Acute renal failure with acute renal cortical necrosis superimposed on stage 3a chronic kidney disease ([place])   Discharged Condition: good  Hospital Course: Patient is a 69 year old male with history of type 2 diabetes, hypertension, obstructive apnea, history of nephrolithiasis, benign prostate hypertrophy and depression who presents to the emergency room complaining of bilateral flank pain. Symptoms started about a week ago. He was nauseated yesterday, he started having fever in the emergency room. He had a mild leukocytosis, he had acute renal failure with metabolic acidosis, hyperkalemia and as well as lactic acidosis of 2.5. CT scan showed no obstructing nephrolithiasis as well as small areas in bilateral renal collecting systems consistent with emphysematous pyelitis.Urology consult was obtained from the emergency room. Patient is covered with broad-spectrum antibiotics with Rocephin 2 g daily.  6/5.Urine culture grow gram negative rods, blood culture still negative today. Rocephin continued. Creatinine level dropped down to 1.7 with a baseline of 1.4.  6/6.  Urine culture came back with E. coli, pansensitive.  Continue Rocephin for 1 more day, plan treated with Cipro orally for total antibiotics 14 days at discharge tomorrow.   #1.  Sepsis secondary to acute pyelonephritis and emphysematous pyelitis. Condition much improved.  Urine culture came back with E. coli pansensitive.   Blood culture still negative.  Will treat with  oral Cipro for additional 10 days to complete 14-day course.  #2.  Acute renal failure on chronic kidney disease stage IIIa.  With normal anion gap metabolic acidosis and hyperkalemia. Condition had improved.  Renal function back to baseline.    3.  Uncontrolled type 2 diabetes with hyperglycemia. Hemoglobin A1c 8.9.  Due to recent renal failure and chronic kidney disease, I will start Lantus.  Will need to titrate dose up at the next visit with his primary care physician.  4.  Essential hypertension. Continue current regimen.  5.  Chronic back pain.  Consults: urology  Significant Diagnostic Studies:  CT ABDOMEN AND PELVIS WITHOUT CONTRAST  TECHNIQUE: Multidetector CT imaging of the abdomen and pelvis was performed following the standard protocol without IV contrast.  COMPARISON:  CT abdomen dated June 12, 2018. CT abdomen and pelvis dated July 24, 2014.  FINDINGS: Lower chest: No acute abnormality. Minimal subsegmental atelectasis.  Hepatobiliary: Diffuse hepatic steatosis. No focal liver abnormality. The gallbladder is unremarkable. No biliary dilatation.  Pancreas: Unremarkable. No pancreatic ductal dilatation or surrounding inflammatory changes.  Spleen: Normal in size without focal abnormality.  Adrenals/Urinary Tract: The adrenal glands are unremarkable. Bilateral renal calculi and simple cysts again noted. New small focus of air within the right and left renal collecting systems (series 2, image 43; series 5, image 110). No hydronephrosis. Chronic bilateral perinephric fat stranding. The bladder is unremarkable.  Stomach/Bowel: Stomach is within normal limits. Appendix appears normal. No evidence of bowel wall thickening, distention, or inflammatory changes.  Vascular/Lymphatic: Aortic atherosclerosis. No enlarged abdominal or pelvic lymph nodes.  Reproductive: Prostate is unremarkable.  Other: Small to moderate left and small right fat  containing inguinal hernias. Prior right inguinal hernia  repair. No free fluid or pneumoperitoneum.  Musculoskeletal: No acute or significant osseous findings.  IMPRESSION: 1. New small foci of air within the bilateral renal collecting systems, consistent with early emphysematous pyelitis. 2. Bilateral nonobstructive nephrolithiasis. 3. Hepatic steatosis. 4. Aortic Atherosclerosis (ICD10-I70.0).   Electronically Signed   By: Titus Dubin M.D.   On: 09/04/2019 18:13      Treatments: antibiotics: ceftriaxone  Discharge Exam: Blood pressure 127/63, pulse 98, temperature 98.2 F (36.8 C), temperature source Oral, resp. rate 17, height 6\' 3"  (1.905 m), weight 124.8 kg, SpO2 93 %. General appearance: alert and cooperative Resp: clear to auscultation bilaterally Cardio: regular rate and rhythm, S1, S2 normal, no murmur, click, rub or gallop GI: soft, non-tender; bowel sounds normal; no masses,  no organomegaly Extremities: extremities normal, atraumatic, no cyanosis or edema  Disposition: Discharge disposition: 01-Home or Self Care       Discharge Instructions    Diet - low sodium heart healthy   Complete by: As directed    Increase activity slowly   Complete by: As directed      Allergies as of 09/08/2019      Reactions   Celebrex  [celecoxib]    GI   Tetanus Toxoids Other (See Comments)   unkown   Tetanus-diphtheria Toxoids Td    Other reaction(s): Unknown      Medication List    STOP taking these medications   glipiZIDE 10 MG tablet Commonly known as: GLUCOTROL     TAKE these medications   ASPIRIN 81 PO Take 81 mg by mouth daily.   atorvastatin 40 MG tablet Commonly known as: LIPITOR Take 1 tablet (40 mg total) by mouth at bedtime.   ciprofloxacin 500 MG tablet Commonly known as: Cipro Take 1 tablet (500 mg total) by mouth 2 (two) times daily for 10 days.   DULoxetine 60 MG capsule Commonly known as: CYMBALTA Take 1 capsule (60 mg total)  by mouth daily. What changed: additional instructions   fluticasone 50 MCG/ACT nasal spray Commonly known as: FLONASE Place 2 sprays into both nostrils daily. 2 sprays   gabapentin 300 MG capsule Commonly known as: Neurontin Take 1 capsule (300 mg total) by mouth 3 (three) times daily. Emergency Supply while mail order comes in.   HYDROcodone-acetaminophen 10-325 MG tablet Commonly known as: NORCO   insulin glargine 100 UNIT/ML Solostar Pen Commonly known as: LANTUS Inject 20 Units into the skin daily.   metFORMIN 1000 MG tablet Commonly known as: GLUCOPHAGE Take 1 tablet (1,000 mg total) by mouth 2 (two) times daily with a meal.   metoprolol succinate 50 MG 24 hr tablet Commonly known as: TOPROL-XL Take 1 tablet (50 mg total) by mouth daily. Take with or immediately following a meal.   spironolactone 25 MG tablet Commonly known as: ALDACTONE Take 2 tablets (50 mg) by mouth once daily   tamsulosin 0.4 MG Caps capsule Commonly known as: FLOMAX Take 1 capsule (0.4 mg total) by mouth daily after supper.   tiZANidine 4 MG tablet Commonly known as: Zanaflex Take 1 tablet (4 mg total) by mouth every 8 (eight) hours as needed for muscle spasms.   Trulicity 1.5 XT/0.2IO Sopn Generic drug: Dulaglutide Inject 1.5 mg into the skin once a week.   valsartan-hydrochlorothiazide 320-12.5 MG tablet Commonly known as: DIOVAN-HCT Take 1 tablet by mouth daily.      Follow-up Information    Hubbard Hartshorn, FNP Follow up in 1 week(s).   Specialty: Family Medicine Contact information:  [address] [place] [address] [phone]        Nelva Bush, MD .   Specialty: Cardiology Contact information: [place] [address] [phone]        Lucas Mallow, MD Follow up in 2 week(s).   Specialty: Urology Contact information: [place] [address] [phone]         35  minutes  Signed: Sharen Hones 09/08/2019, 8:52 AM

## 2019-09-08 NOTE — Discharge Instructions (Signed)
1.  Follow-up with PCP in 1 week.  Need to adjust the insulin dose. 2.  Follow-up with urology in 2 weeks.

## 2019-09-08 NOTE — Care Management Important Message (Signed)
Important Message  Patient Details  Name: Evan Rivas MRN: [id number] Date of Birth: Jun 15, 1950   Medicare Important Message Given:  Yes     Evan Rivas A Evan Rivas 09/08/2019, 10:23 AM

## 2019-09-09 LAB — CULTURE, BLOOD (ROUTINE X 2)
Culture: NO GROWTH
Culture: NO GROWTH

## 2019-09-13 ENCOUNTER — Other Ambulatory Visit: Payer: Self-pay | Admitting: Family Medicine

## 2019-09-13 DIAGNOSIS — I10 Essential (primary) hypertension: Secondary | ICD-10-CM

## 2019-09-13 DIAGNOSIS — F331 Major depressive disorder, recurrent, moderate: Secondary | ICD-10-CM

## 2019-09-13 DIAGNOSIS — F419 Anxiety disorder, unspecified: Secondary | ICD-10-CM

## 2019-09-15 NOTE — Progress Notes (Signed)
Patient ID: Evan Rivas, male    DOB: [birth year], 69 y.o.   MRN: [id number]  PCP: Evan Malkin, MD  Chief Complaint  Patient presents with  . Hospitalization Follow-up    had a bad kidney infection, was in hospital from 6/3-6/7    Subjective:   Evan Rivas is a 69 y.o. male, presents to clinic with CC of the following:  Chief Complaint  Patient presents with  . Hospitalization Follow-up    had a bad kidney infection, was in hospital from 6/3-6/7    HPI:  Patient is a 69 year old male patient of Evan Rivas Last seen at [facility] by Dr. Ancil Rivas on 06/27/2019 with that note reviewed. He follows up today for a posthospitalization follow-up visit. His discharge summary from that hospitalization is as follows:  Admit date: 09/04/2019 Discharge date: 09/08/2019 Discharge Diagnoses:  Active Problems:   Diabetes mellitus type 2 in obese (HCC)   Gout   Essential hypertension   Hyperlipidemia   Depression   CKD stage G3b/A2, GFR 30-44 and albumin creatinine ratio 30-299 mg/g   Emphysematous pyelitis   Pyelonephritis   Sepsis ([place])   Acute renal failure with acute renal cortical necrosis superimposed on stage 3a chronic kidney disease [facility]) Discharged Condition: good Hospital Course: He presented to the emergency room with bilateral flank pain for 1 week.  He had a mild leukocytosis, he had acute renal failure with metabolic acidosis, hyperkalemia and as well as lactic acidosis of 2.5. CT scan showed no obstructing nephrolithiasis as well as small areas in bilateral renal collecting systems consistent with emphysematous pyelitis.Urology consult was obtained from the emergency room. Patient was covered with broad-spectrum antibiotics with Rocephin 2 g daily.  6/5.Urine culture grow gram negative rods, Creatinine level dropped down to 1.7 with a baseline of 1.4. 6/6. Urine culture came back with E. coli, pansensitive. Continue Rocephin for 1 more day,  plan treated with Cipro orally for total antibiotics 14 days at discharge tomorrow.  1. Sepsis concern secondary to acute pyelonephritis and emphysematous pyelitis. Condition much improved. Urine culture came back with E. coli pansensitive.  Blood culture still negative.  Will treat with oral Cipro for additional 10 days to complete 14-day course.  2. Acute renal failure on chronic kidney disease stage IIIa. With normal anion gap metabolic acidosis and hyperkalemia. Condition had improved. Renal function back to baseline.   3. Uncontrolled type 2 diabetes with hyperglycemia. Hemoglobin A1c 8.9.  Due to recent renal failure and chronic kidney disease, I will start Lantus.  Will need to titrate dose up at the next visit with his primary care physician.  4. Essential hypertension. Continue current regimen.  Consults: urology  Significant Diagnostic Studies:  CT ABDOMEN AND PELVIS WITHOUT CONTRAST  IMPRESSION: 1. New small foci of air within the bilateral renal collecting systems, consistent with early emphysematous pyelitis. 2. Bilateral nonobstructive nephrolithiasis. 3. Hepatic steatosis. 4. Aortic Atherosclerosis (ICD10-I70.0)    STOP taking these medications   glipiZIDE 10 MG tablet Commonly known as: GLUCOTROL     TAKE these medications   ASPIRIN 81 PO Take 81 mg by mouth daily.   atorvastatin 40 MG tablet Commonly known as: LIPITOR Take 1 tablet (40 mg total) by mouth at bedtime.   ciprofloxacin 500 MG tablet Commonly known as: Cipro Take 1 tablet (500 mg total) by mouth 2 (two) times daily for 10 days.   DULoxetine 60 MG capsule Commonly known as: CYMBALTA Take 1 capsule (60 mg  total) by mouth daily. What changed: additional instructions   fluticasone 50 MCG/ACT nasal spray Commonly known as: FLONASE Place 2 sprays into both nostrils daily. 2 sprays   gabapentin 300 MG capsule Commonly known as: Neurontin Take 1 capsule (300 mg  total) by mouth 3 (three) times daily. Emergency Supply while mail order comes in.   HYDROcodone-acetaminophen 10-325 MG tablet Commonly known as: NORCO   insulin glargine 100 UNIT/ML Solostar Pen Commonly known as: LANTUS Inject 20 Units into the skin daily.   metFORMIN 1000 MG tablet Commonly known as: GLUCOPHAGE Take 1 tablet (1,000 mg total) by mouth 2 (two) times daily with a meal.   metoprolol succinate 50 MG 24 hr tablet Commonly known as: TOPROL-XL Take 1 tablet (50 mg total) by mouth daily. Take with or immediately following a meal.   spironolactone 25 MG tablet Commonly known as: ALDACTONE Take 2 tablets (50 mg) by mouth once daily   tamsulosin 0.4 MG Caps capsule Commonly known as: FLOMAX Take 1 capsule (0.4 mg total) by mouth daily after supper.   tiZANidine 4 MG tablet Commonly known as: Zanaflex Take 1 tablet (4 mg total) by mouth every 8 (eight) hours as needed for muscle spasms.   Trulicity 1.5 ZH/0.8MV Sopn Generic drug: Dulaglutide Inject 1.5 mg into the skin once a week.   valsartan-hydrochlorothiazide 320-12.5 MG tablet Commonly known as: DIOVAN-HCT Take 1 tablet by mouth daily.   Since discharge, he has remained feeling better. Has 2 more days left of Cipro.   He had seen endocrinology in the past for his diabetes, but with his A1c at goal range, returned to the primary care provider for management.  He was taking Trulicity - thinks is taking 0.75mg  a day and not 1.5mg . Metformin 2000 mg daily, and glipizide 10 mg daily prior to the hospitalization.  He was also on an ARB and statin product. Insulin was added to his regimen during his hospitalization and noted on discharge (insulin glargine-20 units daily), but he did not resume insulin after leaving the hospital. He did resume the glipizide.  He does check his blood sugars at home, every day. Have been up and down. Have been 200's, one 307, and all first thing in am when he gets up.  Insulin  pen was over $100 and noted could not afford it.   Noted not taking statin. Thought he was to stop after last visit with Dr. Ancil Rivas when changed BP med.  I informed him that he needs to continue to take that statin and he will resume today.  Lost 11 pounds since left hospital. Following low salt, low carb diet,  not yet exercising regularly, used to go to gym frequently until closed with Covid, and encouraged to try to increase exercise again.  Wt Readings from Last 3 Encounters:  09/16/19 271 lb 11.2 oz (123.2 kg)  09/05/19 275 lb 2.2 oz (124.8 kg)  07/01/19 282 lb (127.9 kg)    Lab Results  Component Value Date   HGBA1C 8.9 (H) 09/04/2019   HGBA1C 7.3 (H) 04/07/2019   HGBA1C 8.1 (H) 06/28/2018   Lab Results  Component Value Date   MICROALBUR 10.9 04/07/2019   LDLCALC 78 06/28/2018   CREATININE 1.44 (H) 09/08/2019   He denies increased thirst, increased urination.  He denies any recent chest pains, shortness of breath, palpitations, and his legs have been less swollen in the past since his discharge from the hospital.  Denies any flank pains problem no difficulty urinating, no  blood in the urine.  He continues to be followed by nephrology and cardiology, with recent visits in February and March 2021 respectively, and those notes reviewed.  He has a follow-up with cardiology in July and a follow-up with nephrology in August planned.  He also has a follow-up here with Dr. Ancil Rivas planned in a couple weeks.  Patient Active Problem List   Diagnosis Date Noted  . Acute renal failure with acute renal cortical necrosis superimposed on stage 3a chronic kidney disease ([place]) 09/07/2019  . Sepsis ([place]) 09/05/2019  . Emphysematous pyelitis 09/04/2019  . Pyelonephritis 09/04/2019  . CKD stage G3b/A2, GFR 30-44 and albumin creatinine ratio 30-299 mg/g 04/09/2019  . Hyperaldosteronism ([place]) 06/24/2018  . Diastolic dysfunction 23/53/6144  . OSA (obstructive sleep apnea) 06/24/2018  .  Excessive daytime sleepiness 06/24/2018  . Aortic atherosclerosis ([place]) 06/17/2018  . Leg edema 12/18/2017  . BPH without urinary obstruction 06/03/2015  . Depression 03/05/2015  . Hyperlipidemia 09/03/2014  . Major depressive disorder, recurrent episode, moderate ([place]) 09/03/2014  . Chronic low back pain 09/03/2014  . Arthralgia of both knees 09/03/2014  . Type 2 diabetes mellitus without complication ([place]) 31/54/0086  . Allergic rhinitis 07/04/2014  . Anxiety 07/04/2014  . Continuous opioid dependence ([place]) 07/04/2014  . Diabetes mellitus type 2 in obese ([place]) 07/04/2014  . DDD (degenerative disc disease), lumbar 07/04/2014  . Gout 07/04/2014  . Essential hypertension 07/04/2014  . Flu vaccine need 07/04/2014  . Morbid obesity ([place]) 07/04/2014  . OA (osteoarthritis) of knee 07/04/2014  . Neuralgia neuritis, sciatic nerve 07/04/2014  . Arthralgia of multiple joints 07/04/2014  . H/O malignant neoplasm of skin 12/24/2013      Current Outpatient Medications:  .  ASPIRIN 81 PO, Take 81 mg by mouth daily. , Disp: , Rfl:  .  ciprofloxacin (CIPRO) 500 MG tablet, Take 1 tablet (500 mg total) by mouth 2 (two) times daily for 10 days., Disp: 20 tablet, Rfl: 0 .  Dulaglutide (TRULICITY) 1.5 PY/1.9JK SOPN, Inject 1.5 mg into the skin once a week., Disp: 6 mL, Rfl: 3 .  DULoxetine (CYMBALTA) 60 MG capsule, TAKE 1 CAPSULE EVERY DAY, Disp: 90 capsule, Rfl: 0 .  fluticasone (FLONASE) 50 MCG/ACT nasal spray, Place 2 sprays into both nostrils daily. 2 sprays, Disp: 16 g, Rfl: 6 .  gabapentin (NEURONTIN) 300 MG capsule, Take 1 capsule (300 mg total) by mouth 3 (three) times daily. Emergency Supply while mail order comes in., Disp: 270 capsule, Rfl: 3 .  HYDROcodone-acetaminophen (NORCO) 10-325 MG tablet, , Disp: , Rfl:  .  metFORMIN (GLUCOPHAGE) 1000 MG tablet, Take 1 tablet (1,000 mg total) by mouth 2 (two) times daily with a meal., Disp: 180 tablet, Rfl: 3 .  metoprolol succinate (TOPROL-XL) 50  MG 24 hr tablet, Take 1 tablet (50 mg total) by mouth daily. Take with or immediately following a meal., Disp: 90 tablet, Rfl: 3 .  spironolactone (ALDACTONE) 25 MG tablet, Take 2 tablets (50 mg) by mouth once daily, Disp: 270 tablet, Rfl: 3 .  tamsulosin (FLOMAX) 0.4 MG CAPS capsule, Take 1 capsule (0.4 mg total) by mouth daily after supper., Disp: 90 capsule, Rfl: 3 .  valsartan-hydrochlorothiazide (DIOVAN-HCT) 320-12.5 MG tablet, TAKE 1 TABLET EVERY DAY, Disp: 90 tablet, Rfl: 0 .  tiZANidine (ZANAFLEX) 4 MG tablet, Take 1 tablet (4 mg total) by mouth every 8 (eight) hours as needed for muscle spasms. (Patient not taking: Reported on 09/16/2019), Disp: 60 tablet, Rfl: 3   Allergies  Allergen  Reactions  . Celebrex  [Celecoxib]     GI  . Tetanus Toxoids Other (See Comments)    unkown  . Tetanus-Diphtheria Toxoids Td     Other reaction(s): Unknown     Past Surgical History:  Procedure Laterality Date  . ARTHROSCOPIC REPAIR ACL Left   . CYSTOSCOPY W/ URETERAL STENT PLACEMENT Right 09/07/2014   Procedure: CYSTOSCOPY WITH STENT REPLACEMENT;  Surgeon: Hollice Espy, MD;  Location: [facility] OR[facility];  Service: Urology;  Laterality: Right;  . CYSTOSCOPY/URETEROSCOPY/HOLMIUM LASER/STENT PLACEMENT Right 08/17/2014   Procedure: CYSTOSCOPY/URETEROSCOPY//STENT PLACEMENT/ attempt of lithotripsy;  Surgeon: Hollice Espy, MD;  Location: [facility];  Service: Urology;  Laterality: Right;  . HERNIA REPAIR    . MOHS SURGERY     lip  . TOTAL KNEE ARTHROPLASTY Left 04/24/2016   Procedure: LEFT TOTAL KNEE ARTHROPLASTY;  Surgeon: Gaynelle Arabian, MD;  Location: [facility];  Service: Orthopedics;  Laterality: Left;  Adductor Block  . TOTAL KNEE ARTHROPLASTY Right 10/23/2016   Procedure: RIGHT TOTAL KNEE ARTHROPLASTY;  Surgeon: Gaynelle Arabian, MD;  Location: [facility];  Service: Orthopedics;  Laterality: Right;  . URETEROSCOPY WITH HOLMIUM LASER LITHOTRIPSY Right 09/07/2014   Procedure: URETEROSCOPY WITH HOLMIUM LASER LITHOTRIPSY;   Surgeon: Hollice Espy, MD;  Location: [facility];  Service: Urology;  Laterality: Right;  [name] VASECTOMY       Family History  Problem Relation Age of Onset  . Hypertension Mother   . Cancer Mother   . Hyperlipidemia Mother   . Stroke Father   . Heart disease Father   . Depression Sister   . Hypertension Sister   . Hyperlipidemia Sister      Social History   Tobacco Use  . Smoking status: Never Smoker  . Smokeless tobacco: Never Used  Substance Use Topics  . Alcohol use: No    With staff assistance, above reviewed with the patient today.  ROS: As per HPI, otherwise no specific complaints on a limited and focused system review   No results found for this or any previous visit (from the past 72 hour(s)).   PHQ2/9: Depression screen [facility] 2/9 09/16/2019 06/27/2019 04/03/2019 01/01/2019 09/24/2018  Decreased Interest 0 1 0 0 0  Down, Depressed, Hopeless 0 1 0 1 1  PHQ - 2 Score 0 2 0 1 1  Altered sleeping 0 2 0 0 0  Tired, decreased energy 2 2 0 1 0  Change in appetite 0 1 0 0 0  Feeling bad or failure about yourself  0 0 0 0 0  Trouble concentrating 0 0 0 0 0  Moving slowly or fidgety/restless 0 0 0 0 0  Suicidal thoughts 0 0 0 0 0  PHQ-9 Score 2 7 0 2 1  Difficult doing work/chores Not difficult at all Somewhat difficult Not difficult at all Somewhat difficult -  Some recent data might be hidden   PHQ-2/9 Result reviewed   Fall Risk: Fall Risk  09/16/2019 06/27/2019 04/03/2019 01/01/2019 09/24/2018  Falls in the past year? 0 0 0 0 1  Number falls in past yr: 0 0 0 0 1  Injury with Fall? 0 0 0 0 1  Risk for fall due to : - - - - History of fall(s)  Follow up - - Falls evaluation completed - Falls prevention discussed      Objective:   Vitals:   09/16/19 1115  BP: 124/72  Pulse: 94  Resp: 16  Temp: 97.6 F (36.4 C)  TempSrc: Temporal  SpO2: 98%  Weight: 271 lb 11.2 oz (123.2 kg)  Height: 6\' 3"  (1.905 m)    Body mass index is 33.96 kg/m.  Physical Exam    NAD, masked, very pleasant HEENT - [place]/AT, sclera anicteric, PERRL, EOMI, conj - non-inj'ed, pharynx clear Neck - supple, no adenopathy, carotids 2+ and = without bruits bilat Car - RRR without m/g/r Pulm- RR and effort normal at rest, CTA without wheeze or rales Abd - soft, NT diffusely, obese,  Back - no CVA tenderness Skin- no rash noted on exposed areas,  Ext - trace  LE edema,  Neuro/psychiatric - affect was not flat, appropriate with conversation  Alert and oriented  Grossly non-focal   Speech  normal   Results for orders placed or performed during the hospital encounter of 09/04/19  Urine Culture   Specimen: Urine, Random  Result Value Ref Range   Specimen Description      URINE, RANDOM Performed at [facility], [address] [place], [place] [address]    Special Requests      NONE Performed at [facility], [address], [place], [place] [address]    Culture >=100,000 COLONIES/mL ESCHERICHIA COLI (A)    Report Status 09/07/2019 FINAL    Organism ID, Bacteria ESCHERICHIA COLI (A)       Susceptibility   Escherichia coli - MIC*    AMPICILLIN <=2 SENSITIVE Sensitive     CEFAZOLIN <=4 SENSITIVE Sensitive     CEFTRIAXONE <=1 SENSITIVE Sensitive     CIPROFLOXACIN <=0.25 SENSITIVE Sensitive     GENTAMICIN <=1 SENSITIVE Sensitive     IMIPENEM <=0.25 SENSITIVE Sensitive     NITROFURANTOIN <=16 SENSITIVE Sensitive     TRIMETH/SULFA <=20 SENSITIVE Sensitive     AMPICILLIN/SULBACTAM <=2 SENSITIVE Sensitive     PIP/TAZO <=4 SENSITIVE Sensitive     * >=100,000 COLONIES/mL ESCHERICHIA COLI  Blood culture (routine x 2)   Specimen: BLOOD  Result Value Ref Range   Specimen Description BLOOD LEFT ANTECUBITAL    Special Requests      BOTTLES DRAWN AEROBIC AND ANAEROBIC Blood Culture results may not be optimal due to an excessive volume of blood received in culture bottles   Culture      NO GROWTH 5 DAYS Performed at [facility], [address] [place], [place] [address]    Report Status 09/09/2019 FINAL   Blood culture (routine x 2)   Specimen: BLOOD  Result Value Ref Range   Specimen Description BLOOD RIGHT ANTECUBITAL    Special Requests      BOTTLES DRAWN AEROBIC AND ANAEROBIC Blood Culture results may not be optimal due to an excessive volume of blood received in culture bottles   Culture      NO GROWTH 5 DAYS Performed at [facility], [address] [place], [place] [address]    Report Status 09/09/2019 FINAL   SARS Coronavirus 2 by RT PCR (hospital order, performed in [place] hospital lab) Nasopharyngeal Nasopharyngeal Swab   Specimen: Nasopharyngeal Swab  Result Value Ref Range   SARS Coronavirus 2 NEGATIVE NEGATIVE  Urinalysis, Complete w Microscopic  Result Value Ref Range   Color, Urine YELLOW (A) YELLOW   APPearance CLOUDY (A) CLEAR   Specific Gravity, Urine 1.012 1.005 - 1.030   pH 5.0 5.0 - 8.0   Glucose, UA >=500 (A) NEGATIVE mg/dL   Hgb urine dipstick LARGE (A) NEGATIVE   Bilirubin Urine NEGATIVE NEGATIVE   Ketones, ur 5 (A) NEGATIVE mg/dL  Protein, ur 30 (A) NEGATIVE mg/dL   Nitrite POSITIVE (A) NEGATIVE   Leukocytes,Ua MODERATE (A) NEGATIVE   RBC / HPF >50 (H) 0 - 5 RBC/hpf   WBC, UA >50 (H) 0 - 5 WBC/hpf   Bacteria, UA MANY (A) NONE SEEN   Squamous Epithelial / LPF 0-5 0 - 5   WBC Clumps PRESENT    Mucus PRESENT   CBC  Result Value Ref Range   WBC 11.2 (H) 4.0 - 10.5 K/uL   RBC 3.98 (L) 4.22 - 5.81 MIL/uL   Hemoglobin 13.0 13.0 - 17.0 g/dL   HCT 38.1 (L) 39 - 52 %   MCV 95.7 80.0 - 100.0 fL   MCH 32.7 26.0 - 34.0 pg   MCHC 34.1 30.0 - 36.0 g/dL   RDW 13.1 11.5 - 15.5 %   Platelets 221 150 - 400 K/uL   nRBC 0.0 0.0 - 0.2 %  Basic metabolic panel  Result Value Ref Range   Sodium 138 135 - 145 mmol/L   Potassium 5.6 (H) 3.5 - 5.1 mmol/L   Chloride 106 98 - 111 mmol/L   CO2 22 22 - 32 mmol/L   Glucose, Bld 275 (H) 70 - 99 mg/dL   BUN 54 (H) 8 - 23 mg/dL    Creatinine, Ser 1.99 (H) 0.61 - 1.24 mg/dL   Calcium 9.6 8.9 - 10.3 mg/dL   GFR calc non Af Amer 34 (L) >60 mL/min   GFR calc Af Amer 39 (L) >60 mL/min   Anion gap 10 5 - 15  Lactic acid, plasma  Result Value Ref Range   Lactic Acid, Venous 2.2 (HH) 0.5 - 1.9 mmol/L  Lactic acid, plasma  Result Value Ref Range   Lactic Acid, Venous 2.5 (HH) 0.5 - 1.9 mmol/L  Hemoglobin A1c  Result Value Ref Range   Hgb A1c MFr Bld 8.9 (H) 4.8 - 5.6 %   Mean Plasma Glucose 208.73 mg/dL  Brain natriuretic peptide  Result Value Ref Range   B Natriuretic Peptide 182.0 (H) 0.0 - 100.0 pg/mL  CBC  Result Value Ref Range   WBC 15.3 (H) 4.0 - 10.5 K/uL   RBC 3.67 (L) 4.22 - 5.81 MIL/uL   Hemoglobin 12.0 (L) 13.0 - 17.0 g/dL   HCT 36.5 (L) 39 - 52 %   MCV 99.5 80.0 - 100.0 fL   MCH 32.7 26.0 - 34.0 pg   MCHC 32.9 30.0 - 36.0 g/dL   RDW 13.3 11.5 - 15.5 %   Platelets 233 150 - 400 K/uL   nRBC 0.0 0.0 - 0.2 %  Basic metabolic panel  Result Value Ref Range   Sodium 137 135 - 145 mmol/L   Potassium 5.5 (H) 3.5 - 5.1 mmol/L   Chloride 110 98 - 111 mmol/L   CO2 16 (L) 22 - 32 mmol/L   Glucose, Bld 323 (H) 70 - 99 mg/dL   BUN 50 (H) 8 - 23 mg/dL   Creatinine, Ser 2.14 (H) 0.61 - 1.24 mg/dL   Calcium 8.6 (L) 8.9 - 10.3 mg/dL   GFR calc non Af Amer 31 (L) >60 mL/min   GFR calc Af Amer 36 (L) >60 mL/min   Anion gap 11 5 - 15  HIV Antibody (routine testing w rflx)  Result Value Ref Range   HIV Screen 4th Generation wRfx Non Reactive Non Reactive  Glucose, capillary  Result Value Ref Range   Glucose-Capillary 273 (H) 70 - 99 mg/dL  Basic metabolic panel  Result Value Ref Range   Sodium 135 135 - 145 mmol/L   Potassium 4.9 3.5 - 5.1 mmol/L   Chloride 105 98 - 111 mmol/L   CO2 20 (L) 22 - 32 mmol/L   Glucose, Bld 265 (H) 70 - 99 mg/dL   BUN 46 (H) 8 - 23 mg/dL   Creatinine, Ser 1.78 (H) 0.61 - 1.24 mg/dL   Calcium 8.8 (L) 8.9 - 10.3 mg/dL   GFR calc non Af Amer 38 (L) >60 mL/min   GFR calc Af  Amer 44 (L) >60 mL/min   Anion gap 10 5 - 15  Glucose, capillary  Result Value Ref Range   Glucose-Capillary 297 (H) 70 - 99 mg/dL   Comment 1 Notify RN   Glucose, capillary  Result Value Ref Range   Glucose-Capillary 271 (H) 70 - 99 mg/dL   Comment 1 Notify RN   Glucose, capillary  Result Value Ref Range   Glucose-Capillary 132 (H) 70 - 99 mg/dL  Basic metabolic panel  Result Value Ref Range   Sodium 137 135 - 145 mmol/L   Potassium 4.8 3.5 - 5.1 mmol/L   Chloride 100 98 - 111 mmol/L   CO2 26 22 - 32 mmol/L   Glucose, Bld 222 (H) 70 - 99 mg/dL   BUN 37 (H) 8 - 23 mg/dL   Creatinine, Ser 1.70 (H) 0.61 - 1.24 mg/dL   Calcium 8.8 (L) 8.9 - 10.3 mg/dL   GFR calc non Af Amer 41 (L) >60 mL/min   GFR calc Af Amer 47 (L) >60 mL/min   Anion gap 11 5 - 15  Magnesium  Result Value Ref Range   Magnesium 1.5 (L) 1.7 - 2.4 mg/dL  CBC with Differential/Platelet  Result Value Ref Range   WBC 7.6 4.0 - 10.5 K/uL   RBC 3.28 (L) 4.22 - 5.81 MIL/uL   Hemoglobin 10.7 (L) 13.0 - 17.0 g/dL   HCT 32.2 (L) 39 - 52 %   MCV 98.2 80.0 - 100.0 fL   MCH 32.6 26.0 - 34.0 pg   MCHC 33.2 30.0 - 36.0 g/dL   RDW 13.0 11.5 - 15.5 %   Platelets 174 150 - 400 K/uL   nRBC 0.0 0.0 - 0.2 %   Neutrophils Relative % 73 %   Neutro Abs 5.5 1.7 - 7.7 K/uL   Lymphocytes Relative 17 %   Lymphs Abs 1.3 0.7 - 4.0 K/uL   Monocytes Relative 9 %   Monocytes Absolute 0.7 0 - 1 K/uL   Eosinophils Relative 1 %   Eosinophils Absolute 0.1 0 - 0 K/uL   Basophils Relative 0 %   Basophils Absolute 0.0 0 - 0 K/uL   Immature Granulocytes 0 %   Abs Immature Granulocytes 0.02 0.00 - 0.07 K/uL  Glucose, capillary  Result Value Ref Range   Glucose-Capillary 187 (H) 70 - 99 mg/dL   Comment 1 Notify RN   Glucose, capillary  Result Value Ref Range   Glucose-Capillary 213 (H) 70 - 99 mg/dL  Glucose, capillary  Result Value Ref Range   Glucose-Capillary 208 (H) 70 - 99 mg/dL  Glucose, capillary  Result Value Ref Range    Glucose-Capillary 238 (H) 70 - 99 mg/dL  CBC with Differential/Platelet  Result Value Ref Range   WBC 5.0 4.0 - 10.5 K/uL   RBC 3.26 (L) 4.22 - 5.81 MIL/uL   Hemoglobin 10.5 (L) 13.0 - 17.0 g/dL   HCT 31.3 (L) 39 - 52 %  MCV 96.0 80.0 - 100.0 fL   MCH 32.2 26.0 - 34.0 pg   MCHC 33.5 30.0 - 36.0 g/dL   RDW 12.9 11.5 - 15.5 %   Platelets 182 150 - 400 K/uL   nRBC 0.0 0.0 - 0.2 %   Neutrophils Relative % 58 %   Neutro Abs 2.9 1.7 - 7.7 K/uL   Lymphocytes Relative 25 %   Lymphs Abs 1.2 0.7 - 4.0 K/uL   Monocytes Relative 12 %   Monocytes Absolute 0.6 0 - 1 K/uL   Eosinophils Relative 4 %   Eosinophils Absolute 0.2 0 - 0 K/uL   Basophils Relative 1 %   Basophils Absolute 0.0 0 - 0 K/uL   Immature Granulocytes 0 %   Abs Immature Granulocytes 0.02 0.00 - 0.07 K/uL  Basic metabolic panel  Result Value Ref Range   Sodium 137 135 - 145 mmol/L   Potassium 4.0 3.5 - 5.1 mmol/L   Chloride 101 98 - 111 mmol/L   CO2 27 22 - 32 mmol/L   Glucose, Bld 230 (H) 70 - 99 mg/dL   BUN 28 (H) 8 - 23 mg/dL   Creatinine, Ser 1.40 (H) 0.61 - 1.24 mg/dL   Calcium 8.6 (L) 8.9 - 10.3 mg/dL   GFR calc non Af Amer 51 (L) >60 mL/min   GFR calc Af Amer 59 (L) >60 mL/min   Anion gap 9 5 - 15  Magnesium  Result Value Ref Range   Magnesium 2.0 1.7 - 2.4 mg/dL  Glucose, capillary  Result Value Ref Range   Glucose-Capillary 260 (H) 70 - 99 mg/dL  Glucose, capillary  Result Value Ref Range   Glucose-Capillary 216 (H) 70 - 99 mg/dL  Glucose, capillary  Result Value Ref Range   Glucose-Capillary 282 (H) 70 - 99 mg/dL  Glucose, capillary  Result Value Ref Range   Glucose-Capillary 191 (H) 70 - 99 mg/dL  Basic metabolic panel  Result Value Ref Range   Sodium 138 135 - 145 mmol/L   Potassium 4.1 3.5 - 5.1 mmol/L   Chloride 102 98 - 111 mmol/L   CO2 28 22 - 32 mmol/L   Glucose, Bld 246 (H) 70 - 99 mg/dL   BUN 27 (H) 8 - 23 mg/dL   Creatinine, Ser 1.44 (H) 0.61 - 1.24 mg/dL   Calcium 8.7 (L) 8.9 -  10.3 mg/dL   GFR calc non Af Amer 50 (L) >60 mL/min   GFR calc Af Amer 57 (L) >60 mL/min   Anion gap 8 5 - 15  Magnesium  Result Value Ref Range   Magnesium 1.9 1.7 - 2.4 mg/dL  Glucose, capillary  Result Value Ref Range   Glucose-Capillary 173 (H) 70 - 99 mg/dL  Glucose, capillary  Result Value Ref Range   Glucose-Capillary 287 (H) 70 - 99 mg/dL   Comment 1 Notify RN        Assessment & Plan:    1. Encounter for examination following treatment at hospital  2. Pyelonephritis 3. Emphysematous pyelitis Patient notes she is doing much better since discharge. To finish the Cipro course, which she has a couple days left. Denies any recent symptoms of concern since discharge. Keep his planned follow-up with nephrology in August as well.  4. Diabetes mellitus type 2 in obese [facility]) He is not on insulin since leaving the hospital, and return to his regimen of Trulicity, Metformin, and glipizide.  The Trulicity is on the lowest dose-0.75 mg injected once weekly. I  wanted to increase that dose to 1.5 mg today, and noted we likely may need to increase that further over time, although he noted he had help from the pharmacist here to get the limited amount that he has, and likely could not increase that dose without further help in that realm.  If not helped, he will not be able to do so as it was too expensive. Consulted pharmacy here through the [facility] services to help. His sugars probably were up some with the recent infection, although the A1c increase noted likely reflects his sugars have not been very well controlled in the more recent past prior to the hospitalization. Await his follow-up in a couple weeks time, and with help from the pharmacist here, hoping to increase the Trulicity dose, and can recheck an A1c and a BMP at that follow-up visit as well. Continuing to check his blood sugars at home as he is doing, and he does write them down and show me his notebook today with the results  well documented. - Ambulatory referral to [facility]  5. CKD stage G3b/A2, GFR 30-44 and albumin creatinine ratio 30-299 mg/g Noted to him today the importance of following kidney function over time, especially with the medications he is taking.  Also emphasized the importance of keeping his blood sugars very well controlled, also his blood pressure very well controlled, and avoiding chronic NSAIDs to help with the kidney function. He still sees nephrology as well to follow-up, and will keep that follow-up appointment in August.  6. Essential hypertension His blood pressure was good today, and he did have his blood pressure medicine changed on his last visit with Dr. Ancil Rivas. Continue his current medication regimen.  7. Mixed hyperlipidemia Emphasized the importance of continuing his statin product, and he has been off of that since his last visit with Dr. Ancil Rivas.  He will resume the statin product today and continue to take daily. Noted to him the importance of this with his diabetes diagnosis, and the goal of keeping his LDL less than 70.  8. Morbid obesity ([place]) He has been successful losing some weight in the recent past, and strongly encouraged this to continue over time.  Adding exercise to his improved dietary modifications can only be helpful, and strongly encouraged.   He will keep his planned follow-up in a couple weeks time here with Dr. Ancil Rivas, and hope with pharmacy's assistance, the Trulicity can be increased as felt needed on follow-up visits to help keep his blood sugars very well controlled. Follow-up sooner as needed.    Evan Malkin, MD 09/16/19 11:22 AM

## 2019-09-16 ENCOUNTER — Other Ambulatory Visit: Payer: Self-pay

## 2019-09-16 ENCOUNTER — Encounter: Payer: Self-pay | Admitting: Internal Medicine

## 2019-09-16 ENCOUNTER — Ambulatory Visit: Payer: Medicare PPO | Admitting: Internal Medicine

## 2019-09-16 VITALS — BP 124/72 | HR 94 | Temp 97.6°F | Resp 16 | Ht 75.0 in | Wt 271.7 lb

## 2019-09-16 DIAGNOSIS — E119 Type 2 diabetes mellitus without complications: Secondary | ICD-10-CM

## 2019-09-16 DIAGNOSIS — Z09 Encounter for follow-up examination after completed treatment for conditions other than malignant neoplasm: Secondary | ICD-10-CM | POA: Diagnosis not present

## 2019-09-16 DIAGNOSIS — N1832 Chronic kidney disease, stage 3b: Secondary | ICD-10-CM | POA: Diagnosis not present

## 2019-09-16 DIAGNOSIS — E782 Mixed hyperlipidemia: Secondary | ICD-10-CM

## 2019-09-16 DIAGNOSIS — I10 Essential (primary) hypertension: Secondary | ICD-10-CM

## 2019-09-16 DIAGNOSIS — N12 Tubulo-interstitial nephritis, not specified as acute or chronic: Secondary | ICD-10-CM | POA: Diagnosis not present

## 2019-09-16 DIAGNOSIS — E1169 Type 2 diabetes mellitus with other specified complication: Secondary | ICD-10-CM | POA: Diagnosis not present

## 2019-09-16 DIAGNOSIS — E669 Obesity, unspecified: Secondary | ICD-10-CM

## 2019-09-22 ENCOUNTER — Encounter: Payer: Self-pay | Admitting: Family

## 2019-09-22 ENCOUNTER — Ambulatory Visit: Payer: Medicare PPO | Admitting: Family

## 2019-09-22 ENCOUNTER — Other Ambulatory Visit: Payer: Self-pay

## 2019-09-22 VITALS — BP 140/62 | HR 93 | Ht 75.0 in | Wt 270.4 lb

## 2019-09-22 DIAGNOSIS — Z79899 Other long term (current) drug therapy: Secondary | ICD-10-CM

## 2019-09-22 DIAGNOSIS — R600 Localized edema: Secondary | ICD-10-CM | POA: Diagnosis not present

## 2019-09-22 DIAGNOSIS — I152 Hypertension secondary to endocrine disorders: Secondary | ICD-10-CM

## 2019-09-22 MED ORDER — SPIRONOLACTONE 25 MG PO TABS: 50.0000 mg | ORAL_TABLET | Freq: Every day | ORAL | 3 refills | Status: DC

## 2019-09-22 MED ORDER — METOPROLOL SUCCINATE ER 50 MG PO TB24: 50.0000 mg | ORAL_TABLET | Freq: Every day | ORAL | 3 refills | Status: DC

## 2019-09-22 NOTE — Progress Notes (Signed)
Office Visit    Patient Name: Evan Rivas Date of Encounter: 09/22/2019  Primary Care Provider:  Towanda Malkin, MD Primary Cardiologist:  Nelva Bush, MD Electrophysiologist:  None   Chief Complaint    Evan Rivas is a 69 y.o. 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 Anxiety    panic attacks  . Arthritis   . Cancer (HCC)    lip  . Depression   . Diabetes mellitus without complication ([place])    type 2  . GERD (gastroesophageal reflux disease)   . Gout   . History of kidney stones   . Hyperaldosteronism ([place])    bilateral adrenal hyperplasia  . Hyperlipidemia   . Hypertension   . Pneumonia    2014 and 2018 ( 2018-klebsiella pneumonia    Past Surgical History:  Procedure Laterality Date  . ARTHROSCOPIC REPAIR ACL Left   . CYSTOSCOPY W/ URETERAL STENT PLACEMENT Right 09/07/2014   Procedure: CYSTOSCOPY WITH STENT REPLACEMENT;  Surgeon: Hollice Espy, MD;  Location: [facility];  Service: Urology;  Laterality: Right;  . CYSTOSCOPY/URETEROSCOPY/HOLMIUM LASER/STENT PLACEMENT Right 08/17/2014   Procedure: CYSTOSCOPY/URETEROSCOPY//STENT PLACEMENT/ attempt of lithotripsy;  Surgeon: Hollice Espy, MD;  Location: [facility];  Service: Urology;  Laterality: Right;  . HERNIA REPAIR    . MOHS SURGERY     lip  . TOTAL KNEE ARTHROPLASTY Left 04/24/2016   Procedure: LEFT TOTAL KNEE ARTHROPLASTY;  Surgeon: Gaynelle Arabian, MD;  Location: [facility];  Service: Orthopedics;  Laterality: Left;  Adductor Block  . TOTAL KNEE ARTHROPLASTY Right 10/23/2016   Procedure: RIGHT TOTAL KNEE ARTHROPLASTY;  Surgeon: Gaynelle Arabian, MD;  Location: [facility];  Service: Orthopedics;  Laterality: Right;  . URETEROSCOPY WITH HOLMIUM LASER LITHOTRIPSY  Right 09/07/2014   Procedure: URETEROSCOPY WITH HOLMIUM LASER LITHOTRIPSY;  Surgeon: Hollice Espy, MD;  Location: [facility];  Service: Urology;  Laterality: Right;  [name] VASECTOMY      Allergies  Allergies  Allergen Reactions  . Celebrex  [Celecoxib]     GI  . Tetanus Toxoids Other (See Comments)    unkown  . Tetanus-Diphtheria Toxoids Td     Other reaction(s): Unknown    History of Present Illness    Evan Rivas is a 69 y.o. 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  In 11/2019 his Spironolactone was increased due to LE edema and Metoprol decreased due to new first degree AV block. Subsequently, Spironolactone decreased due to bump in creatinine and potassium. At last office visit 03/19/19 his Amlodipine was discontinued as his blood pressure was well controlled and he wished to consolidate his medications.  Seen by PCP 06/27/19. His home BP was averaging 140s/80s. His Losartan was transitioned to Diovan.  When seen in clinic 07/01/19 he was noted to be mildly hypertensive, but had not yet started his new medication.   Hen was admitted 09/04/19 for pyelonephritis. Treated with antibiotics. Due to A1c of 8.9 and CKD Lantus was added to med regimen.  He has since returned to his previous regimen of glipizide, metformin, Trelegy.  Continues to follow with primary care  Enjoys spending time  with his granddaughters.  He and his wife watch them during the daytime.  They are now 77 months and 69 years old.  They keep him very active.  Endorses eating a low-sodium, heart healthy diet.  Since his hospitalization he has moved to a more carb conscious diet and notices he has lost weight.  He plans to continue.  Reports no shortness of breath nor dyspnea on exertion. Reports no chest pain, pressure, or tightness. No edema,  orthopnea, PND. Reports no palpitations.   EKGs/Labs/Other Studies Reviewed:   The following studies were reviewed today:  EKG:  EKG is ordered today.  The ekg ordered today demonstrates NSR 93 bpm with low voltage QRS and no acute ST/T wave changes.  Recent Labs: 04/07/2019: ALT 24 09/04/2019: B Natriuretic Peptide 182.0 09/07/2019: Hemoglobin 10.5; Platelets 182 09/08/2019: BUN 27; Creatinine, Ser 1.44; Magnesium 1.9; Potassium 4.1; Sodium 138  Recent Lipid Panel    Component Value Date/Time   CHOL 147 06/28/2018 1019   CHOL 140 09/17/2017 1014   TRIG 305 (H) 06/28/2018 1019   HDL 32 (L) 06/28/2018 1019   HDL 32 (L) 09/17/2017 1014   CHOLHDL 4.6 06/28/2018 1019   LDLCALC 78 06/28/2018 1019    Home Medications   Current Meds  Medication Sig  . ASPIRIN 81 PO Take 81 mg by mouth daily.   [name] atorvastatin (LIPITOR) 40 MG tablet Take 40 mg by mouth daily.  . Dulaglutide (TRULICITY) 1.5 WC/5.8NI SOPN Inject 1.5 mg into the skin once a week.  . DULoxetine (CYMBALTA) 60 MG capsule TAKE 1 CAPSULE EVERY DAY  . fluticasone (FLONASE) 50 MCG/ACT nasal spray Place 2 sprays into both nostrils daily. 2 sprays  . gabapentin (NEURONTIN) 300 MG capsule Take 1 capsule (300 mg total) by mouth 3 (three) times daily. Emergency Supply while mail order comes in.  [name] HYDROcodone-acetaminophen (NORCO) 10-325 MG tablet   . metFORMIN (GLUCOPHAGE) 1000 MG tablet Take 1 tablet (1,000 mg total) by mouth 2 (two) times daily with a meal.  . metoprolol succinate (TOPROL-XL) 50 MG 24 hr tablet Take 1 tablet (50 mg total) by mouth daily. Take with or immediately following a meal.  . spironolactone (ALDACTONE) 25 MG tablet Take 2 tablets (50 mg) by mouth once daily  . tamsulosin (FLOMAX) 0.4 MG CAPS capsule Take 1 capsule (0.4 mg total) by mouth daily after supper.  [name] tiZANidine (ZANAFLEX) 4 MG tablet Take 1 tablet (4 mg total) by mouth every 8 (eight) hours as needed for muscle spasms.  . valsartan-hydrochlorothiazide  (DIOVAN-HCT) 320-12.5 MG tablet TAKE 1 TABLET EVERY DAY    Review of Systems   Review of Systems  Constitutional: Negative for chills, fever and malaise/fatigue.  Cardiovascular: Negative for chest pain, dyspnea on exertion, leg swelling, near-syncope, orthopnea, palpitations and syncope.  Respiratory: Negative for cough, shortness of breath and wheezing.   Gastrointestinal: Negative for nausea and vomiting.  Neurological: Negative for dizziness, light-headedness and weakness.   All other systems reviewed and are otherwise negative except as noted above.  Physical Exam    VS:  BP 140/62 (BP Location: Left Arm, Patient Position: Sitting, Cuff Size: Large)   Pulse 93   Ht 6\' 3"  (1.905 m)   Wt 270 lb 6 oz (122.6 kg)   SpO2 98%   BMI 33.79 kg/m  , BMI Body mass index is 33.79 kg/m. GEN: Well nourished, overweight, well developed, in no acute distress. HEENT: normal. Neck: Supple, no JVD, carotid bruits, or masses. Cardiac: RRR, no  murmurs, rubs, or gallops. No clubbing, cyanosis, edema.  Radials/DP/PT 2+ and equal bilaterally.  Respiratory:  Respirations regular and unlabored, clear to auscultation bilaterally. GI: Soft, nontender, nondistended, BS + x 4. MS: No deformity or atrophy. Skin: Warm and dry, no rash. Neuro:  Strength and sensation are intact. Psych: Normal affect.  Assessment & Plan    1. 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  Due to occasional hypotensive reading at home we will defer additional changes to his antihypertensive regimen. Goal blood pressure less than 130/80.  Continue Toprol 50 mg daily, spironolactone 50 mg daily, valsartan-hydrochlorothiazide 320-12.5 daily.  If additional control needed in the future could consider uptitrating his Toprol versus transition to Coreg.  2. Leg edema - Euvolemic on exam.  Continue spironolactone 50 mg daily.  3. DM2 - A1c of 8.9 on 09/04/19.  Continue to follow with primary  care.  4. CKD 3 -Careful titration of diuretics and antihypertensive agents.  5. OSA -has stopped wearing CPAP as he did not feel that it was really helping him.  CPAP compliance encouraged.  Encouraged him to call pulmonology to follow-up.  6. HLD -continue atorvastatin 40 mg daily.  7. Morbid obesity -he has lost 12 pounds since last seen.  He has been careful about his diet.  Encouraged continued weight loss and congratulated him on his success thus far.  Disposition:  Follow up in 4 month(s) with Dr. [name] or APP.    Loel Dubonnet, NP 09/22/2019, 10:20 AM

## 2019-09-22 NOTE — Patient Instructions (Addendum)
Medication Instructions:  No medication changes today.   Refill of Spironolactone and Toprol were sent to [facility] today.   *If you need a refill on your cardiac medications before your next appointment, please call your pharmacy*  Lab Work: None ordered today.   Testing/Procedures: Your EKG today shows normal sinus rhythm.   Follow-Up: At [facility], you and your health needs are our priority.  As part of our continuing mission to provide you with exceptional heart care, we have created designated Provider Care Teams.  These Care Teams include your primary Cardiologist (physician) and Advanced Practice Providers (APPs -  Physician Assistants and Nurse Practitioners) who all work together to provide you with the care you need, when you need it.  We recommend signing up for the patient portal called "MyChart".  Sign up information is provided on this After Visit Summary.  MyChart is used to connect with patients for Virtual Visits (Telemedicine).  Patients are able to view lab/test results, encounter notes, upcoming appointments, etc.  Non-urgent messages can be sent to your provider as well.   To learn more about what you can do with MyChart, go to [email or web address].    Your next appointment:   4 month(s)  The format for your next appointment:   In Person  Provider:   You may see Nelva Bush, MD or one of the following Advanced Practice Providers on your designated Care Team:    Murray Hodgkins, NP  Christell Faith, PA-C  Marrianne Mood, PA-C  [name], NP  Other Instructions  Keep up the good work with weight loss. You're doing an amazing job!

## 2019-09-23 ENCOUNTER — Encounter: Payer: Self-pay | Admitting: Physician Assistant

## 2019-09-23 ENCOUNTER — Ambulatory Visit (INDEPENDENT_AMBULATORY_CARE_PROVIDER_SITE_OTHER): Payer: Medicare PPO | Admitting: Physician Assistant

## 2019-09-23 VITALS — BP 139/86 | HR 116 | Ht 75.0 in | Wt 270.5 lb

## 2019-09-23 DIAGNOSIS — N12 Tubulo-interstitial nephritis, not specified as acute or chronic: Secondary | ICD-10-CM

## 2019-09-23 DIAGNOSIS — R3912 Poor urinary stream: Secondary | ICD-10-CM

## 2019-09-23 LAB — BLADDER SCAN AMB NON-IMAGING

## 2019-09-23 NOTE — Patient Instructions (Addendum)
Cystoscopy Cystoscopy is a procedure that is used to help diagnose and sometimes treat conditions that affect the lower urinary tract. The lower urinary tract includes the bladder and the urethra. The urethra is the tube that drains urine from the bladder. Cystoscopy is done using a thin, tube-shaped instrument with a light and camera at the end (cystoscope). The cystoscope may be hard or flexible, depending on the goal of the procedure. The cystoscope is inserted through the urethra, into the bladder. Cystoscopy may be recommended if you have:  Urinary tract infections that keep coming back.  Blood in the urine (hematuria).  An inability to control when you urinate (urinary incontinence) or an overactive bladder.  Unusual cells found in a urine sample.  A blockage in the urethra, such as a urinary stone.  Painful urination.  An abnormality in the bladder found during an intravenous pyelogram (IVP) or CT scan. Cystoscopy may also be done to remove a sample of tissue to be examined under a microscope (biopsy). What are the risks? Generally, this is a safe procedure. However, problems may occur, including:  Infection.  Bleeding.  What happens during the procedure?  1. You will be given one or more of the following: ? A medicine to numb the area (local anesthetic). 2. The area around the opening of your urethra will be cleaned. 3. The cystoscope will be passed through your urethra into your bladder. 4. Germ-free (sterile) fluid will flow through the cystoscope to fill your bladder. The fluid will stretch your bladder so that your health care provider can clearly examine your bladder walls. 5. Your doctor will look at the urethra and bladder. 6. The cystoscope will be removed The procedure may vary among health care providers  What can I expect after the procedure? After the procedure, it is common to have: 1. Some soreness or pain in your abdomen and urethra. 2. Urinary symptoms.  These include: ? Mild pain or burning when you urinate. Pain should stop within a few minutes after you urinate. This may last for up to 1 week. ? A small amount of blood in your urine for several days. ? Feeling like you need to urinate but producing only a small amount of urine. Follow these instructions at home: General instructions  Return to your normal activities as told by your health care provider.   Do not drive for 24 hours if you were given a sedative during your procedure.  Watch for any blood in your urine. If the amount of blood in your urine increases, call your health care provider.  If a tissue sample was removed for testing (biopsy) during your procedure, it is up to you to get your test results. Ask your health care provider, or the department that is doing the test, when your results will be ready.  Drink enough fluid to keep your urine pale yellow.  Keep all follow-up visits as told by your health care provider. This is important. Contact a health care provider if you:  Have pain that gets worse or does not get better with medicine, especially pain when you urinate.  Have trouble urinating.  Have more blood in your urine. Get help right away if you:  Have blood clots in your urine.  Have abdominal pain.  Have a fever or chills.  Are unable to urinate. Summary  Cystoscopy is a procedure that is used to help diagnose and sometimes treat conditions that affect the lower urinary tract.  Cystoscopy is done using   a thin, tube-shaped instrument with a light and camera at the end.  After the procedure, it is common to have some soreness or pain in your abdomen and urethra.  Watch for any blood in your urine. If the amount of blood in your urine increases, call your health care provider.  If you were prescribed an antibiotic medicine, take it as told by your health care provider. Do not stop taking the antibiotic even if you start to feel better. This  information is not intended to replace advice given to you by your health care provider. Make sure you discuss any questions you have with your health care provider. Document Revised: 03/12/2018 Document Reviewed: 03/12/2018 Elsevier Patient Education  2020 [employer]   

## 2019-09-23 NOTE — Progress Notes (Signed)
09/23/2019 9:54 AM   Evan Rivas Mar 20, 1951 [id number]  CC: Chief Complaint  Patient presents with  . Hospitalization Follow-up    HPI: Evan Rivas is a 69 y.o. male with PMH BPH on Flomax managed by PCP, nephrolithiasis requiring staged right ureteroscopy with Dr. 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  While hospitalized, patient reported a years long history of weak and intermittent urinary stream.  Operative note with Dr. Erlene Quan in 2016 with intraoperative findings of mild bulbar urethral stricture and enlarged prostatic urethra with slight asymmetry/enlargement of the left lateral lobe.  IPSS 14/4, as below, representing moderate prostate symptoms.  Patient reports overall feeling much better after recent kidney infection with no residual pain. He continues to take tamsulosin daily. He is most bothered by urinary dribbling, which occurs daily. He wears absorbant pads for management of this.  In-office UA today positive for trace-intact blood; urine microscopy with 6-10 WBCs/HPF. PVR 53mL.   IPSS    Row Name 09/23/19 0900         International Prostate Symptom Score   How often have you had the sensation of not emptying your bladder? Less than half the time     How often have you had to urinate less than every two hours? Less than half the time     How often have you found you stopped and started again several times when you urinated? More than half the time     How often have you found it difficult to postpone urination? Less than 1 in 5 times     How often have you had a weak urinary stream? More than half the time     How often have you had to strain to start urination? Not at All     How many times did you typically get up at night to urinate? 1 Time     Total IPSS Score 14       Quality  of Life due to urinary symptoms   If you were to spend the rest of your life with your urinary condition just the way it is now how would you feel about that? Mostly Disatisfied           PMH: Past Medical History:  Diagnosis Date  . Anxiety    panic attacks  . Arthritis   . Cancer (HCC)    lip  . Depression   . Diabetes mellitus without complication ([place])    type 2  . GERD (gastroesophageal reflux disease)   . Gout   . History of kidney stones   . Hyperaldosteronism ([place])    bilateral adrenal hyperplasia  . Hyperlipidemia   . Hypertension   . Pneumonia    2014 and 2018 ( 2018-klebsiella pneumonia     Surgical History: Past Surgical History:  Procedure Laterality Date  . ARTHROSCOPIC REPAIR ACL Left   . CYSTOSCOPY W/ URETERAL STENT PLACEMENT Right 09/07/2014   Procedure: CYSTOSCOPY WITH STENT REPLACEMENT;  Surgeon: Hollice Espy, MD;  Location: [facility];  Service: Urology;  Laterality: Right;  . CYSTOSCOPY/URETEROSCOPY/HOLMIUM LASER/STENT PLACEMENT Right 08/17/2014   Procedure: CYSTOSCOPY/URETEROSCOPY//STENT PLACEMENT/ attempt of lithotripsy;  Surgeon: Hollice Espy, MD;  Location: [facility];  Service: Urology;  Laterality: Right;  . HERNIA REPAIR    . MOHS SURGERY     lip  . TOTAL KNEE ARTHROPLASTY Left 04/24/2016  Procedure: LEFT TOTAL KNEE ARTHROPLASTY;  Surgeon: Gaynelle Arabian, MD;  Location: [facility];  Service: Orthopedics;  Laterality: Left;  Adductor Block  . TOTAL KNEE ARTHROPLASTY Right 10/23/2016   Procedure: RIGHT TOTAL KNEE ARTHROPLASTY;  Surgeon: Gaynelle Arabian, MD;  Location: [facility];  Service: Orthopedics;  Laterality: Right;  . URETEROSCOPY WITH HOLMIUM LASER LITHOTRIPSY Right 09/07/2014   Procedure: URETEROSCOPY WITH HOLMIUM LASER LITHOTRIPSY;  Surgeon: Hollice Espy, MD;  Location: [facility];  Service: Urology;  Laterality: Right;  [name] VASECTOMY      Home Medications:  Allergies as of 09/23/2019      Reactions   Celebrex  [celecoxib]    GI   Tetanus Toxoids  Other (See Comments)   unkown   Tetanus-diphtheria Toxoids Td    Other reaction(s): Unknown      Medication List       Accurate as of September 23, 2019  9:54 AM. If you have any questions, ask your nurse or doctor.        ASPIRIN 81 PO Take 81 mg by mouth daily.   atorvastatin 40 MG tablet Commonly known as: LIPITOR Take 40 mg by mouth daily.   DULoxetine 60 MG capsule Commonly known as: CYMBALTA TAKE 1 CAPSULE EVERY DAY   fluticasone 50 MCG/ACT nasal spray Commonly known as: FLONASE Place 2 sprays into both nostrils daily. 2 sprays   gabapentin 300 MG capsule Commonly known as: Neurontin Take 1 capsule (300 mg total) by mouth 3 (three) times daily. Emergency Supply while mail order comes in.   glipiZIDE 10 MG tablet Commonly known as: GLUCOTROL   HYDROcodone-acetaminophen 10-325 MG tablet Commonly known as: NORCO   metFORMIN 1000 MG tablet Commonly known as: GLUCOPHAGE Take 1 tablet (1,000 mg total) by mouth 2 (two) times daily with a meal.   metoprolol succinate 50 MG 24 hr tablet Commonly known as: TOPROL-XL Take 1 tablet (50 mg total) by mouth daily. Take with or immediately following a meal.   spironolactone 25 MG tablet Commonly known as: ALDACTONE Take 2 tablets (50 mg total) by mouth daily.   tamsulosin 0.4 MG Caps capsule Commonly known as: FLOMAX Take 1 capsule (0.4 mg total) by mouth daily after supper.   tiZANidine 4 MG tablet Commonly known as: Zanaflex Take 1 tablet (4 mg total) by mouth every 8 (eight) hours as needed for muscle spasms.   Trulicity 1.5 NW/2.9FA Sopn Generic drug: Dulaglutide Inject 1.5 mg into the skin once a week.   valsartan-hydrochlorothiazide 320-12.5 MG tablet Commonly known as: DIOVAN-HCT TAKE 1 TABLET EVERY DAY       Allergies:  Allergies  Allergen Reactions  . Celebrex  [Celecoxib]     GI  . Tetanus Toxoids Other (See Comments)    unkown  . Tetanus-Diphtheria Toxoids Td     Other reaction(s): Unknown     Family History: Family History  Problem Relation Age of Onset  . Hypertension Mother   . Cancer Mother   . Hyperlipidemia Mother   . Stroke Father   . Heart disease Father   . Depression Sister   . Hypertension Sister   . Hyperlipidemia Sister     Social History:   reports that he has never smoked. He has never used smokeless tobacco. He reports that he does not drink alcohol and does not use drugs.  Physical Exam: BP 139/86 (BP Location: Left Arm, Patient Position: Sitting, Cuff Size: Large)   Pulse (!) 116   Ht 6\' 3"  (1.905 m)  Wt 270 lb 8 oz (122.7 kg)   BMI 33.81 kg/m   Constitutional:  Alert and oriented, no acute distress, nontoxic appearing HEENT: [place], AT Cardiovascular: No clubbing, cyanosis, or edema Respiratory: Normal respiratory effort, no increased work of breathing GI: Abdomen is soft, nontender, nondistended, no abdominal masses GU: No CVA tenderness Lymph: No cervical or inguinal lymphadenopathy Skin: No rashes, bruises or suspicious lesions Neurologic: Grossly intact, no focal deficits, moving all 4 extremities Psychiatric: Normal mood and affect  Laboratory Data: Results for orders placed or performed in visit on 09/23/19  Microscopic Examination   Urine  Result Value Ref Range   WBC, UA 6-10 (A) 0 - 5 /hpf   RBC 0-2 0 - 2 /hpf   Epithelial Cells (non renal) 0-10 0 - 10 /hpf   Casts Present (A) None seen /lpf   Cast Type Hyaline casts N/A   Bacteria, UA None seen None seen/Few  Urinalysis, Complete  Result Value Ref Range   Specific Gravity, UA 1.020 1.005 - 1.030   pH, UA 5.0 5.0 - 7.5   Color, UA Yellow Yellow   Appearance Ur Clear Clear   Leukocytes,UA Negative Negative   Protein,UA Negative Negative/Trace   Glucose, UA Trace (A) Negative   Ketones, UA Negative Negative   RBC, UA Trace (A) Negative   Bilirubin, UA Negative Negative   Urobilinogen, Ur 0.2 0.2 - 1.0 mg/dL   Nitrite, UA Negative Negative   Microscopic Examination See  below:   Bladder Scan (Post Void Residual) in office  Result Value Ref Range   Scan Result 68mL    Assessment & Plan:   1. Weak urinary stream Symptoms persistent despite daily Flomax; adequate emptying today. Differential includes BPH vs. urethral stricture. Recommend cysto for further evaluation. Patient is in agreement with this plan. - Bladder Scan (Post Void Residual) in office - Urinalysis, Complete  2. Emphysematous pyelitis Symptoms resolved, UA reassuring for persistent infection. No further intervention indicated.  Return in about 2 weeks (around 10/07/2019) for Cystoscopy with Dr. Erlene Quan.  Debroah Loop, PA-C  [facility] [address] [place], [place] [address] [phone]

## 2019-09-25 LAB — MICROSCOPIC EXAMINATION: Bacteria, UA: NONE SEEN

## 2019-09-25 LAB — URINALYSIS, COMPLETE
Bilirubin, UA: NEGATIVE
Ketones, UA: NEGATIVE
Leukocytes,UA: NEGATIVE
Nitrite, UA: NEGATIVE
Protein,UA: NEGATIVE
Specific Gravity, UA: 1.02 (ref 1.005–1.030)
Urobilinogen, Ur: 0.2 mg/dL (ref 0.2–1.0)
pH, UA: 5 (ref 5.0–7.5)

## 2019-09-29 ENCOUNTER — Encounter: Payer: Self-pay | Admitting: Family Medicine

## 2019-09-29 ENCOUNTER — Encounter: Payer: Medicare PPO | Admitting: Family Medicine

## 2019-09-29 ENCOUNTER — Other Ambulatory Visit: Payer: Self-pay

## 2019-09-29 NOTE — Progress Notes (Signed)
Error

## 2019-09-29 NOTE — Progress Notes (Signed)
Patient ID: Evan Rivas, male    DOB: January 03, 1951, 69 y.o.   MRN: [id number]  PCP: Towanda Malkin, MD  Chief Complaint  Patient presents with  . Follow-up  . Medication Refill    Hydrocodone refill, pt needs new opiod treatment contract    Subjective:   Evan Rivas is a 69 y.o. male, presents to clinic with CC of the following:  Chief Complaint  Patient presents with  . Follow-up  . Medication Refill    Hydrocodone refill, pt needs new opiod treatment contract    HPI:  Patient is a 69 y.o.male who I saw 09/16/19 for post hospitalization f/u, was admitted for a kidney infection, was better on the f/u visit. His appt yesterday with [name] was canceled and rescheduled for today.  Recent Pyelonephritis/Emphysematous pyelitis Patient continues to be better since discharge finished the Cipro course,  Denies any recent symptoms of concern since discharge, with no fever,  flank pains, no difficulty urinating, no blood in the urine. Had recent f/u with urology 09/23/19 (see below) Keep his planned follow-up with nephrology in August as well.  DM - He had seen endocrinology in the past for his diabetes, but with his A1c at goal range, returned to the primary care provider for management.  He is takingTrulicity - 0.75mg  a day and not 1.5mg . Metformin 2000 mg daily, and glipizide 10 mg daily prior to the hospitalization.  He was also on an ARB and statin product. Insulin was added to his regimen during his hospitalization and noted on discharge (insulin glargine-20 units daily), but he did not resume insulin after leaving the hospital. He did resume the glipizide.(Insulin pen was over $100 and noted could not afford it)   He does check his blood sugars at home,  first thing in am when he gets up. Have still been running higher than desired noted. have been running in the 200+ range, none > 300. Was 211 this morning.   Lab Results  Component Value Date    HGBA1C 8.9 (H) 09/04/2019   HGBA1C 7.3 (H) 04/07/2019   HGBA1C 8.1 (H) 06/28/2018   Lab Results  Component Value Date   MICROALBUR 10.9 04/07/2019   LDLCALC 78 06/28/2018   CREATININE 1.44 (H) 09/08/2019   He denies increased thirst, increased urination. increased numbness or tingling in the ext's  I wanted to increase the trulicity dose to 1.5 mg last visit, and noted we likely may need to increase that further over time, although he noted he had help from the pharmacist here to get the limited amount that he has, and likely could not increase that dose without further help in that realm.  If not helped, he will not be able to do so as it was too expensive. Consulted pharmacy here through the [facility] services to help. He notes he has not yet been contacted through them to help with this. His sugars probably were up some with the recent infection, although the A1c increase noted likely reflects his sugars have not been very well controlled in the more recent past prior to the hospitalization.  Hyperlipidemia - Noted not taking statin last visit. Thought he was to stop after last visit with [name] when she changed BP med.  I informed him that he needs to continue to take that statin and he resumed after the 09/16/19 visit Noted to him the importance of this with his diabetes diagnosis, and the goal of keeping his LDL  less than 70. Lab Results  Component Value Date   CHOL 147 06/28/2018   HDL 32 (L) 06/28/2018   LDLCALC 78 06/28/2018   TRIG 305 (H) 06/28/2018   CHOLHDL 4.6 06/28/2018     Morbid Obesity Continues to try to lose weight, with his weight just up a little bit in the recent past. Trying his best to watch his diet recently. Not yet exercising regularly, used to go to gym frequently until closed with Covid, and encouraged to try to increase exercise again. Struggling to get back to the gym with caring for grandchildren taking a lot of his time recently  Wt Readings from Last  3 Encounters:  09/30/19 274 lb 1.6 oz (124.3 kg)  09/29/19 273 lb 3.2 oz (123.9 kg)  09/23/19 270 lb 8 oz (122.7 kg)    CKD stage G3b/A2, GFR 30-44 and albumin creatinine ratio 30-299 mg/g Noted  the importance of following kidney function over time, especially with the medications he is taking.  Also emphasized the importance of keeping his blood sugars well controlled, also his blood pressure very well controlled, and avoiding chronic NSAIDs to help with the kidney function. He still sees nephrology as well to follow-up, and will keep that follow-up appointment in August.  Hyperaldosteronism:He saw endocrinology lastinJuly, 2020 with the plan as noted; Plan * While he has hyperaldosteronism, this is felt not to be due to an adrenal adenoma. Rather likely has bilateral adrenal hyperplasia and this surgical intervention not warranted. Continue spironolactone. Encouraged follow up with Cardiology for hypertension mgmt with consideration of titrating spironolactone to ideal hypertension control. * Diabetes is well controlled. Continue current regimen. * Discussed weight loss. He is interested in weight loss and was encouraged to work on reducing portions and regularly exercising.  * He may follow up with me as needed.   Essential hypertension Med regimen -  Toprol 50 mg daily, spironolactone 50 mg daily, valsartan-hydrochlorothiazide 320-12.5 daily Taking meds regularly BP at home routinely 130s/80s with occasional hypotensive reading at home noted Goal blood pressure less than 130/80.  He denies any recent chest pains, shortness of breath, palpitations, and his legs have been less swollen in the past since his discharge from the hospital, although noted slightly swollen today due to socks is wearing Continues to get nephrology and cardiology input.  BLE Edema/Diastolic Dysfunction/HLD:Seeing cardiology and nephrology,on spironolactone, metoprolol XL 50mg . Swelling has improved since  stopping amlodipine back in Dec, changed ARB from losartan to diovan by [name] on visit prior to being hospitalized, on statin therapy, wearing compression stockings less with the heat.  Major Depression and Anxiety:was on Prozac but since January 2021 switched to duloxetine , was on 20 mg and [name]  increased his dose to help with his pain and mood last visit to 60 mg. Taking care of his grandchildren and increases his stresses. He notes he is doing well on the 60 mg dose presently.  Daytime Sleepiness/OSA:He noted the CPAP was not helping him feel better, and thus, stopped using. (started Jan 2020).  He stated today that he is overweight, and is going to snore.  Discussed the concerns with potential apneic episodes that hopefully CPAP could be helpful with, although he does not want to return to using it presently. Returning to pulmonology has been cost prohibitive for him to date  TDV:VOHY had recent f/u with urology after hospitalization on 09/23/19 A/P as follows: 1. Weak urinary stream Symptoms persistent despite daily Flomax; adequate emptying today. Differential includes  BPH vs. urethral stricture. Recommend cysto for further evaluation. Patient is in agreement with this plan. - Bladder Scan (Post Void Residual) in office 2. Emphysematous pyelitis Symptoms resolved, UA reassuring for persistent infection. No further intervention indicated. Return in about 2 weeks (around 10/07/2019) for Cystoscopy   Gout hx: allopurinol was D/C'ed due to recent kidney function decrease and no recent gout flare  Chronic Pain Management hx -DDD/chronic back pain:He has never had any spinal surgeries; Dr. Dot Been management)did not do any procedures with [name] as Mr. Alemu declined.Dr. [name] requested that the patient's narcotic pain medication Rx be transferred to our clinic to help with financial burden of specialty visits for the patient, therefore Mr. Byrnes has  been receiving his pain management medication from our clinic instead.Per last note by [name], at this time, we will maintain Rx's with our clinic, had controlled substance contract renewal last visit in March, 2021.  I noted we will need to do another contract today with me as the provider if I am going to prescribe this, and he was okay with that. -Medications: Taking  Tizanidine,  Norco - Taking 10-325mg  TID with 1/2 tablet for 1 dose 3 days a week. Gabapentin 300mg  TID. Has Narcan auto-injector at home and knows how to use it.  His pain at this time is 4/10 across his lumbar spine, occasionally goes down to right outer hip.He also has some bilaterally knee pain - s/p bilateral knee replacement for OA in 2018   Controlled Substance Database checked and no suspicious findings.Pt has been consistently very compliant. Last scripts written in March - tiZANidine (ZANAFLEX) 4 MG tablet; Take 1 tablet (4 mg total) by mouth every 8 (eight) hours as needed for muscle spasms.  Dispense: 60 tablet; Refill: 3 - HYDROcodone-acetaminophen (NORCO) 10-325 MG tablet; Take 1 tab BID; For 3rd daily dose: Take 1/2 tab Mon/Wed/Fri and take 1 tab Sun/Tues/Thurs/Sat  Dispense: 87 tablet; Refill: 0 - HYDROcodone-acetaminophen (NORCO) 10-325 MG tablet; Take 1 tablet by mouth in the morning, at noon, and at bedtime for 5 days. Take 1 tab BID; For 3rd daily dose: Take 1/2 tab Mon/Wed/Fri and take 1 tab Sun/Tues/Thurs/Sat  Dispense: 87 tablet; Refill: 0 - HYDROcodone-acetaminophen (NORCO) 10-325 MG tablet; Take 1 tablet by mouth in the morning, at noon, and at bedtime for 5 days. Take 1 tab BID; For 3rd daily dose: Take 1/2 tab Mon/Wed/Fri and take 1 tab Sun/Tues/Thurs/Sat  Dispense: 87 tablet; Refill: 0 He noted it is somewhat of an inconvenience trying to cut the pill in half to take that half a dose, and I do not feel it is unreasonable to just take 1 tablet 3 times a day as it is not significantly increased from the  current dosage.   He continues to be followed by nephrology and cardiology. His last visit with cardiology was 09/22/19 with the following A/P noted: 1.  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  Due to occasional hypotensive reading at home we will defer additional changes to his antihypertensive regimen. Goal blood pressure less than 130/80.  Continue Toprol 50 mg daily, spironolactone 50 mg daily, valsartan-hydrochlorothiazide 320-12.5 daily.  If additional control needed in the future could consider uptitrating his Toprol versus transition to Coreg.  2. Leg edema - Euvolemic on exam.  Continue spironolactone 50 mg daily. 3.  He has a follow-up with cardiology in the fall and a follow-up with nephrology in August planned.  Patient Active Problem List  Diagnosis Date Noted  . Acute renal failure with acute renal cortical necrosis superimposed on stage 3a chronic kidney disease ([place]) 09/07/2019  . Sepsis ([place]) 09/05/2019  . Emphysematous pyelitis 09/04/2019  . Pyelonephritis 09/04/2019  . CKD stage G3b/A2, GFR 30-44 and albumin creatinine ratio 30-299 mg/g 04/09/2019  . Hyperaldosteronism ([place]) 06/24/2018  . Diastolic dysfunction 16/01/9603  . OSA (obstructive sleep apnea) 06/24/2018  . Excessive daytime sleepiness 06/24/2018  . Aortic atherosclerosis ([place]) 06/17/2018  . Leg edema 12/18/2017  . BPH without urinary obstruction 06/03/2015  . Depression 03/05/2015  . Hyperlipidemia 09/03/2014  . Major depressive disorder, recurrent episode, moderate ([place]) 09/03/2014  . Chronic low back pain 09/03/2014  . Arthralgia of both knees 09/03/2014  . Type 2 diabetes mellitus without complication ([place]) 54/12/8117  . Allergic rhinitis 07/04/2014  . Anxiety 07/04/2014  . Continuous opioid dependence ([place]) 07/04/2014  . Diabetes mellitus type 2 in obese ([place]) 07/04/2014  . DDD (degenerative disc disease), lumbar 07/04/2014  . Gout 07/04/2014  .  Essential hypertension 07/04/2014  . Flu vaccine need 07/04/2014  . Morbid obesity ([place]) 07/04/2014  . OA (osteoarthritis) of knee 07/04/2014  . Neuralgia neuritis, sciatic nerve 07/04/2014  . Arthralgia of multiple joints 07/04/2014  . H/O malignant neoplasm of skin 12/24/2013      Current Outpatient Medications:  .  ASPIRIN 81 PO, Take 81 mg by mouth daily. , Disp: , Rfl:  .  atorvastatin (LIPITOR) 40 MG tablet, Take 40 mg by mouth daily., Disp: , Rfl:  .  Dulaglutide (TRULICITY) 1.5 JY/7.8GN SOPN, Inject 1.5 mg into the skin once a week., Disp: 6 mL, Rfl: 3 .  DULoxetine (CYMBALTA) 60 MG capsule, TAKE 1 CAPSULE EVERY DAY, Disp: 90 capsule, Rfl: 0 .  fluticasone (FLONASE) 50 MCG/ACT nasal spray, Place 2 sprays into both nostrils daily. 2 sprays, Disp: 16 g, Rfl: 6 .  glipiZIDE (GLUCOTROL) 10 MG tablet, , Disp: , Rfl:  .  HYDROcodone-acetaminophen (NORCO) 10-325 MG tablet, , Disp: , Rfl:  .  metFORMIN (GLUCOPHAGE) 1000 MG tablet, Take 1 tablet (1,000 mg total) by mouth 2 (two) times daily with a meal., Disp: 180 tablet, Rfl: 3 .  metoprolol succinate (TOPROL-XL) 50 MG 24 hr tablet, Take 1 tablet (50 mg total) by mouth daily. Take with or immediately following a meal., Disp: 90 tablet, Rfl: 3 .  spironolactone (ALDACTONE) 25 MG tablet, Take 2 tablets (50 mg total) by mouth daily., Disp: 180 tablet, Rfl: 3 .  tamsulosin (FLOMAX) 0.4 MG CAPS capsule, Take 1 capsule (0.4 mg total) by mouth daily after supper., Disp: 90 capsule, Rfl: 3 .  tiZANidine (ZANAFLEX) 4 MG tablet, Take 1 tablet (4 mg total) by mouth every 8 (eight) hours as needed for muscle spasms., Disp: 60 tablet, Rfl: 3 .  valsartan-hydrochlorothiazide (DIOVAN-HCT) 320-12.5 MG tablet, TAKE 1 TABLET EVERY DAY, Disp: 90 tablet, Rfl: 0 .  gabapentin (NEURONTIN) 300 MG capsule, Take 1 capsule (300 mg total) by mouth 3 (three) times daily. Emergency Supply while mail order comes in., Disp: 270 capsule, Rfl: 3   Allergies  Allergen  Reactions  . Celebrex  [Celecoxib]     GI  . Tetanus Toxoids Other (See Comments)    unkown  . Tetanus-Diphtheria Toxoids Td     Other reaction(s): Unknown     Past Surgical History:  Procedure Laterality Date  . ARTHROSCOPIC REPAIR ACL Left   . CYSTOSCOPY W/ URETERAL STENT PLACEMENT Right 09/07/2014   Procedure: CYSTOSCOPY WITH STENT REPLACEMENT;  Surgeon:  Hollice Espy, MD;  Location: [facility];  Service: Urology;  Laterality: Right;  . CYSTOSCOPY/URETEROSCOPY/HOLMIUM LASER/STENT PLACEMENT Right 08/17/2014   Procedure: CYSTOSCOPY/URETEROSCOPY//STENT PLACEMENT/ attempt of lithotripsy;  Surgeon: Hollice Espy, MD;  Location: [facility];  Service: Urology;  Laterality: Right;  . HERNIA REPAIR    . MOHS SURGERY     lip  . TOTAL KNEE ARTHROPLASTY Left 04/24/2016   Procedure: LEFT TOTAL KNEE ARTHROPLASTY;  Surgeon: Gaynelle Arabian, MD;  Location: [facility];  Service: Orthopedics;  Laterality: Left;  Adductor Block  . TOTAL KNEE ARTHROPLASTY Right 10/23/2016   Procedure: RIGHT TOTAL KNEE ARTHROPLASTY;  Surgeon: Gaynelle Arabian, MD;  Location: [facility];  Service: Orthopedics;  Laterality: Right;  . URETEROSCOPY WITH HOLMIUM LASER LITHOTRIPSY Right 09/07/2014   Procedure: URETEROSCOPY WITH HOLMIUM LASER LITHOTRIPSY;  Surgeon: Hollice Espy, MD;  Location: [facility];  Service: Urology;  Laterality: Right;  [name] VASECTOMY       Family History  Problem Relation Age of Onset  . Hypertension Mother   . Cancer Mother   . Hyperlipidemia Mother   . Stroke Father   . Heart disease Father   . Depression Sister   . Hypertension Sister   . Hyperlipidemia Sister      Social History   Tobacco Use  . Smoking status: Never Smoker  . Smokeless tobacco: Never Used  Substance Use Topics  . Alcohol use: No    With staff assistance, above reviewed with the patient today.  ROS: As per HPI, otherwise no specific complaints on a limited and focused system review   No results found for this or any previous  visit (from the past 72 hour(s)).   PHQ2/9: Depression screen [facility] 2/9 09/30/2019 09/29/2019 09/16/2019 06/27/2019 04/03/2019  Decreased Interest 0 0 0 1 0  Down, Depressed, Hopeless 0 0 0 1 0  PHQ - 2 Score 0 0 0 2 0  Altered sleeping 0 0 0 2 0  Tired, decreased energy 0 0 2 2 0  Change in appetite 0 0 0 1 0  Feeling bad or failure about yourself  0 0 0 0 0  Trouble concentrating 0 0 0 0 0  Moving slowly or fidgety/restless 0 0 0 0 0  Suicidal thoughts 0 0 0 0 0  PHQ-9 Score 0 0 2 7 0  Difficult doing work/chores Not difficult at all - Not difficult at all Somewhat difficult Not difficult at all  Some recent data might be hidden   PHQ-2/9 Result is neg  Fall Risk: Fall Risk  09/30/2019 09/29/2019 09/16/2019 06/27/2019 04/03/2019  Falls in the past year? 0 0 0 0 0  Number falls in past yr: 0 0 0 0 0  Injury with Fall? 0 0 0 0 0  Risk for fall due to : - - - - -  Follow up - - - - Falls evaluation completed      Objective:   Vitals:   09/30/19 1353  BP: 138/64  Pulse: 93  Resp: 16  Temp: 97.8 F (36.6 C)  TempSrc: Temporal  SpO2: 99%  Weight: 274 lb 1.6 oz (124.3 kg)  Height: 6\' 3"  (1.905 m)    Body mass index is 34.26 kg/m.  Physical Exam   NAD, masked, very pleasant HEENT - [place]/AT, sclera anicteric, PERRL, EOMI, conj - non-inj'ed, pharynx clear Neck - supple, no adenopathy, no TM, carotids 2+ and = without bruits bilat, no JVD Car - RRR without m/g/r Pulm- RR and effort normal at rest, CTA  without wheeze or rales Abd - soft, NT diffusely, obese,  Back - no CVA tenderness Skin- no rash noted on exposed areas,  Ext - 1+ pitting  LE edema bilateral,  Neuro/psychiatric - affect was not flat, appropriate with conversation             Alert             Grossly non-focal with good strength in the upper and lower extremities, good shoulder shrug, sensation intact to light touch in the distal extremities  Speech normal  Results for orders placed or performed in visit on  09/23/19  Microscopic Examination   Urine  Result Value Ref Range   WBC, UA 6-10 (A) 0 - 5 /hpf   RBC 0-2 0 - 2 /hpf   Epithelial Cells (non renal) 0-10 0 - 10 /hpf   Casts Present (A) None seen /lpf   Cast Type Hyaline casts N/A   Bacteria, UA None seen None seen/Few  Urinalysis, Complete  Result Value Ref Range   Specific Gravity, UA 1.020 1.005 - 1.030   pH, UA 5.0 5.0 - 7.5   Color, UA Yellow Yellow   Appearance Ur Clear Clear   Leukocytes,UA Negative Negative   Protein,UA Negative Negative/Trace   Glucose, UA Trace (A) Negative   Ketones, UA Negative Negative   RBC, UA Trace (A) Negative   Bilirubin, UA Negative Negative   Urobilinogen, Ur 0.2 0.2 - 1.0 mg/dL   Nitrite, UA Negative Negative   Microscopic Examination See below:   Bladder Scan (Post Void Residual) in office  Result Value Ref Range   Scan Result 70mL        Assessment & Plan:   1. Pyelonephritis 2. Emphysematous pyelitis Remains improved after his recent hospitalization.  He did finish the Cipro course. Keep his planned follow-up with nephrology in August.  3. Diabetes mellitus type 2 in obese ([place]) His blood sugars have not been as well controlled in the recent past, and would like to increase his Trulicity to the 1.5 dose.  Have consulted pharmacy to help with this, as they were very helpful in obtaining the medicine for him to use, and he is concerned if he doubles the medicine, running out too quickly and not being able to get refills. I have asked Melissa to try to get in touch with the pharmacist to help get this moving sooner than later to help him get the higher dose needed. He needs to continue the Metformin and glipizide presently. He also will continue to check his blood sugars at home, and I did note if they are increasing, to follow-up sooner than planned to help assess best steps at that point. Did not feel rechecking the A1c would be particularly helpful today, and it is hoped we can check  that after he is on the increased dose, hopefully starting in the very near future.  4. CKD stage G3b/A2, GFR 30-44 and albumin creatinine ratio 30-299 mg/g Continue with nephrology follow-up, planned in August.  5. Essential hypertension His blood pressure remains well controlled. He did just recently see cardiology who noted the same. Continue his current blood pressure medication regimen.  6. Mixed hyperlipidemia Remains on a statin product, which was re -started after our last visit, as he had some confusion when stopping it prior. Goal is to keep the LDL less than 70. We will recheck lipid panel in the near future when labs are checked again, as he has not been fasting prior  to this afternoon appointment.  7. Morbid obesity ([place]) Discussed the importance of healthy weight maintenance with him today, and he hopes to get more active again and increase his physical activity levels to help in combination with dietary modifications.  8. DDD (degenerative disc disease), lumbar 9. Continuous opioid dependence ([place]) 10. Controlled substance agreement signed As noted above, his chronic pain management has been handled through this office, and is to continue. He has been compliant with this medication, and emphasized the importance of this over time and the substances being very well controlled.  He was very understanding of this. Another contract was signed today with me as the provider. Did feel increasing the dose to 1 tablet 3 times daily was reasonable, as he was cutting 1 and half to take half a dose of one of the tablets 3 days a week, with the increase not significant, and easing of the difficulty in the dosing and cutting tablets in half. We will continue to closely monitor her usage, and he was made aware of the possibility of a drug test at some point over time as was noted in the contract as well. Did note if his symptoms are increasing and not able to be controlled with the current  pain medication regimen, will likely need to consult with the surgeon again for potential further recommendations. - HYDROcodone-acetaminophen ([place]) 10-325 MG tablet; Take 1 tablet by mouth 3 (three) times daily.  Dispense: 90 tablet; Refill: 0 - HYDROcodone-acetaminophen (NORCO) 10-325 MG tablet; Take 1 tablet by mouth 3 (three) times daily.  Dispense: 90 tablet; Refill: 0 - HYDROcodone-acetaminophen (NORCO) 10-325 MG tablet; Take 1 tablet by mouth 3 (three) times daily.  Dispense: 90 tablet; Refill: 0   11. Bilateral lower extremity edema He notes the swelling has not increased significantly in the recent past, although it is a little more prominent when he does not wear the compression stockings which have been challenging in the hotter weather. Encouraged to try to wear the compression stockings at times to help. Also continue his current medication regimen. Also has continued input with nephrology and cardiology.  12. Major depressive disorder, recurrent episode, moderate (HCC)/Anxiety He is doing well with the increase Cymbalta dose at 60 mg and continue presently.  13. Hyperaldosteronism ([place]) As noted on review of the last note with endocrine above. Follow-up with them was left as as needed in the future.  14. BPH without urinary obstruction Continues to see urology with the recent note reviewed. Has cystoscopy planned in about 2 weeks.  16. OSA (obstructive sleep apnea) He no longer uses CPAP, as he did not think it was helpful, despite some prior recommendations to try to continue.  17. Diastolic dysfunction Still follows with cardiology.  Keep the follow-up visit as planned  46. Chronic gout without tophus, unspecified cause, unspecified site He has had no recent gout flares, and is now off the allopurinol. Continue to monitor.    We will schedule follow-up in 2.5-3 months time at the latest, to follow-up sooner as needed.  Ideally, would like him to be on the higher  dose of Trulicity within the next couple weeks with pharmacy's help, and then be able to check an A1c and other labs approximately 8 weeks after to help assess his blood sugar control with the Trulicity increase. Have asked Melissa to help initiate the contact with pharmacy, with a formal referral provided last visit.     Towanda Malkin, MD 09/30/19 2:17 PM

## 2019-09-30 ENCOUNTER — Ambulatory Visit (INDEPENDENT_AMBULATORY_CARE_PROVIDER_SITE_OTHER): Payer: Medicare PPO | Admitting: Internal Medicine

## 2019-09-30 ENCOUNTER — Encounter: Payer: Self-pay | Admitting: Internal Medicine

## 2019-09-30 ENCOUNTER — Ambulatory Visit: Payer: Medicare PPO

## 2019-09-30 VITALS — BP 138/64 | HR 93 | Temp 97.8°F | Resp 16 | Ht 75.0 in | Wt 274.1 lb

## 2019-09-30 DIAGNOSIS — F1120 Opioid dependence, uncomplicated: Secondary | ICD-10-CM

## 2019-09-30 DIAGNOSIS — M1A9XX0 Chronic gout, unspecified, without tophus (tophi): Secondary | ICD-10-CM

## 2019-09-30 DIAGNOSIS — I10 Essential (primary) hypertension: Secondary | ICD-10-CM | POA: Diagnosis not present

## 2019-09-30 DIAGNOSIS — F419 Anxiety disorder, unspecified: Secondary | ICD-10-CM

## 2019-09-30 DIAGNOSIS — E782 Mixed hyperlipidemia: Secondary | ICD-10-CM

## 2019-09-30 DIAGNOSIS — E269 Hyperaldosteronism, unspecified: Secondary | ICD-10-CM

## 2019-09-30 DIAGNOSIS — E669 Obesity, unspecified: Secondary | ICD-10-CM

## 2019-09-30 DIAGNOSIS — E1169 Type 2 diabetes mellitus with other specified complication: Secondary | ICD-10-CM

## 2019-09-30 DIAGNOSIS — M5136 Other intervertebral disc degeneration, lumbar region: Secondary | ICD-10-CM

## 2019-09-30 DIAGNOSIS — R600 Localized edema: Secondary | ICD-10-CM

## 2019-09-30 DIAGNOSIS — N1832 Chronic kidney disease, stage 3b: Secondary | ICD-10-CM

## 2019-09-30 DIAGNOSIS — I5189 Other ill-defined heart diseases: Secondary | ICD-10-CM

## 2019-09-30 DIAGNOSIS — N12 Tubulo-interstitial nephritis, not specified as acute or chronic: Secondary | ICD-10-CM

## 2019-09-30 DIAGNOSIS — Z79899 Other long term (current) drug therapy: Secondary | ICD-10-CM

## 2019-09-30 DIAGNOSIS — F331 Major depressive disorder, recurrent, moderate: Secondary | ICD-10-CM

## 2019-09-30 DIAGNOSIS — N400 Enlarged prostate without lower urinary tract symptoms: Secondary | ICD-10-CM

## 2019-09-30 DIAGNOSIS — G4733 Obstructive sleep apnea (adult) (pediatric): Secondary | ICD-10-CM

## 2019-09-30 MED ORDER — HYDROCODONE-ACETAMINOPHEN 10-325 MG PO TABS: 1.0000 | ORAL_TABLET | Freq: Three times a day (TID) | ORAL | 0 refills | Status: DC

## 2019-09-30 NOTE — Patient Instructions (Signed)
Type 2 Diabetes Mellitus, Self Care, Adult When you have type 2 diabetes (type 2 diabetes mellitus), you must make sure your blood sugar (glucose) stays in a healthy range. You can do this with:  Nutrition.  Exercise.  Lifestyle changes.  Medicines or insulin, if needed.  Support from your doctors and others. How to stay aware of blood sugar   Check your blood sugar level every day, as often as told.  Have your A1c (hemoglobin A1c) level checked two or more times a year. Have it checked more often if your doctor tells you to. Your doctor will set personal treatment goals for you. Generally, you should have these blood sugar levels:  Before meals (preprandial): 80-130 mg/dL (4.4-7.2 mmol/L).  After meals (postprandial): below 180 mg/dL (10 mmol/L).  A1c level: less than 7%. How to manage high and low blood sugar Signs of high blood sugar High blood sugar is called hyperglycemia. Know the signs of high blood sugar. Signs may include:  Feeling: ? Thirsty. ? Hungry. ? Very tired.  Needing to pee (urinate) more than usual.  Blurry vision. Signs of low blood sugar Low blood sugar is called hypoglycemia. This is when blood sugar is at or below 70 mg/dL (3.9 mmol/L). Signs may include:  Feeling: ? Hungry. ? Worried or nervous (anxious). ? Sweaty and clammy. ? Confused. ? Dizzy. ? Sleepy. ? Sick to your stomach (nauseous).  Having: ? A fast heartbeat. ? A headache. ? A change in your vision. ? Jerky movements that you cannot control (seizure). ? Tingling or no feeling (numbness) around your mouth, lips, or tongue.  Having trouble with: ? Moving (coordination). ? Sleeping. ? Passing out (fainting). ? Getting upset easily (irritability). Treating low blood sugar To treat low blood sugar, eat or drink something sugary right away. If you can think clearly and swallow safely, follow the 15:15 rule:  Take 15 grams of a fast-acting carb (carbohydrate). Talk with your  doctor about how much you should take.  Some fast-acting carbs are: ? Sugar tablets (glucose pills). Take 3-4 pills. ? 6-8 pieces of hard candy. ? 4-6 oz (120-150 mL) of fruit juice. ? 4-6 oz (120-150 mL) of regular (not diet) soda. ? 1 Tbsp (15 mL) honey or sugar.  Check your blood sugar 15 minutes after you take the carb.  If your blood sugar is still at or below 70 mg/dL (3.9 mmol/L), take 15 grams of a carb again.  If your blood sugar does not go above 70 mg/dL (3.9 mmol/L) after 3 tries, get help right away.  After your blood sugar goes back to normal, eat a meal or a snack within 1 hour. Treating very low blood sugar If your blood sugar is at or below 54 mg/dL (3 mmol/L), you have very low blood sugar (severe hypoglycemia). This is an emergency. Do not wait to see if the symptoms will go away. Get medical help right away. Call your local emergency services (911 in the [place] If you have very low blood sugar and you cannot eat or drink, you may need a glucagon shot (injection). A family member or friend should learn how to check your blood sugar and how to give you a glucagon shot. Ask your doctor if you need to have a glucagon shot kit at home. Follow these instructions at home: Medicine  Take insulin and diabetes medicines as told.  If your doctor says you should take more or less insulin and medicines, do this exactly as told.  Do not run out of insulin or medicines. Having diabetes can raise your risk for other long-term conditions. These include heart disease and kidney disease. Your doctor may prescribe medicines to help you not have these problems. Food   Make healthy food choices. These include: ? Chicken, fish, egg whites, and beans. ? Oats, whole wheat, bulgur, brown rice, quinoa, and millet. ? Fresh fruits and vegetables. ? Low-fat dairy products. ? Nuts, avocado, olive oil, and canola oil.  Meet with a food specialist (dietitian). He or she can help you make an  eating plan that is right for you.  Follow instructions from your doctor about what you cannot eat or drink.  Drink enough fluid to keep your pee (urine) pale yellow.  Keep track of carbs that you eat. Do this by reading food labels and learning food serving sizes.  Follow your sick day plan when you cannot eat or drink normally. Make this plan with your doctor so it is ready to use. Activity  Exercise 3 or more times a week.  Do not go more than 2 days without exercising.  Talk with your doctor before you start a new exercise. Your doctor may need to tell you to change: ? How much insulin or medicines you take. ? How much food you eat. Lifestyle  Do not use any tobacco products. These include cigarettes, chewing tobacco, and e-cigarettes. If you need help quitting, ask your doctor.  Ask your doctor how much alcohol is safe for you.  Learn to deal with stress. If you need help with this, ask your doctor. Body care   Stay up to date with your shots (immunizations).  Have your eyes and feet checked by a doctor as often as told.  Check your skin and feet every day. Check for cuts, bruises, redness, blisters, or sores.  Brush your teeth and gums two times a day. Floss one or more times a day.  Go to the dentist one or more times every 6 months.  Stay at a healthy weight. General instructions  Take over-the-counter and prescription medicines only as told by your doctor.  Share your diabetes care plan with: ? Your work or school. ? People you live with.  Carry a card or wear jewelry that says you have diabetes.  Keep all follow-up visits as told by your doctor. This is important. Questions to ask your doctor  Do I need to meet with a diabetes educator?  Where can I find a support group for people with diabetes? Where to find more information To learn more about diabetes, visit:  American Diabetes Association: www.diabetes.org  American Association of Diabetes  Educators: www.diabeteseducator.org Summary  When you have type 2 diabetes, you must make sure your blood sugar (glucose) stays in a healthy range.  Check your blood sugar every day, as often as told.  Having diabetes can raise your risk for other conditions. Your doctor may prescribe medicines to help you not have these problems.  Keep all follow-up visits as told by your doctor. This is important. This information is not intended to replace advice given to you by your health care provider. Make sure you discuss any questions you have with your health care provider. Document Revised: 09/10/2017 Document Reviewed: 04/23/2015 Elsevier Patient Education  2020 [employer].

## 2019-10-02 ENCOUNTER — Other Ambulatory Visit: Payer: Self-pay

## 2019-10-02 ENCOUNTER — Ambulatory Visit: Payer: Medicare PPO | Admitting: Pharmacist

## 2019-10-02 ENCOUNTER — Ambulatory Visit: Payer: Medicare PPO

## 2019-10-02 DIAGNOSIS — E1169 Type 2 diabetes mellitus with other specified complication: Secondary | ICD-10-CM

## 2019-10-02 DIAGNOSIS — E7800 Pure hypercholesterolemia, unspecified: Secondary | ICD-10-CM

## 2019-10-02 NOTE — Chronic Care Management (AMB) (Signed)
Chronic Care Management Pharmacy  Name: Evan Rivas  MRN: [id number] DOB: 01-01-1951  Chief Complaint/ HPI  Evan Rivas,  69 y.o. , male presents for their Initial CCM visit with the clinical pharmacist via telephone due to COVID-19 Pandemic.  PCP : Evan Malkin, MD  Their chronic conditions include: HTN, HLD, DM, BPH, DDD  Office Visits: 6/29 Pyelonephritis, Evan Rivas, BP 138/64 P 93 Wt 274 BMI 34.3, Cipro complete, Trulicity 4.09BD, self d/c Lantus 20u/d added hosp,  FBG 200 - 300, denies statin, denies CPAP, urinary sx  Consult Visit: 6/21 HTN, Evan Rivas, BP 140/62 P 93 Wt 270.5 BMI 33.8, d/c amlodipine 12/20, A1c 8.9%, denies SOB, future inc Toprol (not switch to Coreg)  Medications: Outpatient Encounter Medications as of 10/02/2019  Medication Sig  . ASPIRIN 81 PO Take 81 mg by mouth daily.   [name] atorvastatin (LIPITOR) 40 MG tablet Take 40 mg by mouth daily.  . Dulaglutide (TRULICITY) 1.5 ZH/2.9JM SOPN Inject 1.5 mg into the skin once a week.  . DULoxetine (CYMBALTA) 60 MG capsule TAKE 1 CAPSULE EVERY DAY  . fluticasone (FLONASE) 50 MCG/ACT nasal spray Place 2 sprays into both nostrils daily. 2 sprays (Patient taking differently: Place 2 sprays into both nostrils daily. Using once weekly in summer)  . glipiZIDE (GLUCOTROL) 10 MG tablet daily before breakfast.   . HYDROcodone-acetaminophen (NORCO) 10-325 MG tablet Take 1 tablet by mouth 3 (three) times daily.  Derrill Memo ON 10/31/2019] HYDROcodone-acetaminophen (NORCO) 10-325 MG tablet Take 1 tablet by mouth 3 (three) times daily.  Derrill Memo ON 11/30/2019] HYDROcodone-acetaminophen (NORCO) 10-325 MG tablet Take 1 tablet by mouth 3 (three) times daily.  . hydrOXYzine (ATARAX/VISTARIL) 25 MG tablet Take 25 mg by mouth 3 (three) times daily as needed. Using about once monthly  . metFORMIN (GLUCOPHAGE) 1000 MG tablet Take 1 tablet (1,000 mg total) by mouth 2 (two) times daily with a meal.  . metoprolol succinate  (TOPROL-XL) 50 MG 24 hr tablet Take 1 tablet (50 mg total) by mouth daily. Take with or immediately following a meal.  . Multiple Vitamin (MULTIVITAMIN) tablet Take 1 tablet by mouth daily.  [name] spironolactone (ALDACTONE) 25 MG tablet Take 2 tablets (50 mg total) by mouth daily.  . tamsulosin (FLOMAX) 0.4 MG CAPS capsule Take 1 capsule (0.4 mg total) by mouth daily after supper.  [name] tiZANidine (ZANAFLEX) 4 MG tablet Take 1 tablet (4 mg total) by mouth every 8 (eight) hours as needed for muscle spasms. (Patient taking differently: Take 4 mg by mouth every 8 (eight) hours as needed for muscle spasms. About twice monthly)  . valsartan-hydrochlorothiazide (DIOVAN-HCT) 320-12.5 MG tablet TAKE 1 TABLET EVERY DAY (Patient taking differently: 1 tablet daily. )  . gabapentin (NEURONTIN) 300 MG capsule Take 1 capsule (300 mg total) by mouth 3 (three) times daily. Emergency Supply while mail order comes in.   No facility-administered encounter medications on file as of 10/02/2019.      Financial Resource Strain: High Risk  . Difficulty of Paying Living Expenses: Hard   Current Diagnosis/Assessment:  Goals Addressed            This Visit's Progress   . Chronic Care Management       CARE PLAN ENTRY (see longitudinal plan of care for additional care plan information)  Current Barriers:  . Chronic Disease Management support, education, and care coordination needs related to Hypertension, Hyperlipidemia, Diabetes, and BPH   Hypertension BP Readings from Last 3 Encounters:  09/30/19 138/64  09/29/19 118/70  09/23/19 139/86   . Pharmacist Clinical Goal(s): o Over the next 90 days, patient will work with PharmD and providers to maintain BP goal <130/80 . Current regimen:  o Diovan-HCT 320-59m daily o Toprol-XL 532mdaily with food o Spironolactone 5073maily . Interventions: o None . Patient self care activities - Over the next 90 days, patient will: o Check BP weekly, document, and provide at  future appointments o Ensure daily salt intake < 2300 mg/day  Hyperlipidemia Lab Results  Component Value Date/Time   LDLCALC 78 06/28/2018 10:19 AM   . Pharmacist Clinical Goal(s): o Over the next 90 days, patient will work with PharmD and providers to achieve LDL goal < 70 . Current regimen:  o Lipitor 46m46mily . Interventions: o Restart atorvastatin 46mg11mly  o Check fasting lipid profile and liver function in 6 weeks . Patient self care activities - Over the next 90 days, patient will: o Take atorvastatin 46mg 31my as directed  Diabetes Lab Results  Component Value Date/Time   HGBA1C 8.9 (H) 09/04/2019 07:12 PM   HGBA1C 7.3 (H) 04/07/2019 10:58 AM   HGBA1C 8.9 03/25/2018 10:00 AM   HGBA1C 8.9 (A) 03/25/2018 10:00 AM   HGBA1C 8.9 (A) 03/25/2018 10:00 AM   HGBA1C 8.0 (A) 12/18/2017 08:57 AM   . Pharmacist Clinical Goal(s): o Over the next 90 days, patient will work with PharmD and providers to achieve A1c goal <7% . Current regimen:  o Metformin 1000mg t43m daily o Trulicity 0.75mg 0.80EM weekly o Glipizide 10mg da26m. Interventions: o Verbal order to [facility] to increase Trulicity 1.5mg da3.3KP Patient self care activities - Over the next 90 days, patient will: o Check blood sugar once daily, document, and provide at future appointments o Contact provider with any episodes of hypoglycemia  BPH . Pharmacist Clinical Goal(s) o Over the next 90 days, patient will work with PharmD and providers to improve bladder control . Current regimen:  o Tamsulosin 0.4mg dail35m Interventions: o Consider increase tamsulosin 0.4mg daily45mPatient self care activities - Over the next 90 days, patient will: o Work with provider and PharmD to achieve optimal bladder control  Medication management . Pharmacist Clinical Goal(s): o Over the next 90 days, patient will work with PharmD and providers to maintain optimal medication adherence . Current pharmacy:  [facility] . Interventions o Comprehensive medication review performed. o Continue current medication management strategy . Patient self care activities - Over the next 90 days, patient will: o Focus on medication adherence by ensuring [facility] sends increased dose of Trulicity o Take medications as prescribed o Report any questions or concerns to PharmD and/or provider(s)  Initial goal documentation       Diabetes   Recent Relevant Labs: Lab Results  Component Value Date/Time   HGBA1C 8.9 (H) 09/04/2019 07:12 PM   HGBA1C 7.3 (H) 04/07/2019 10:58 AM   HGBA1C 8.9 03/25/2018 10:00 AM   HGBA1C 8.9 (A) 03/25/2018 10:00 AM   HGBA1C 8.9 (A) 03/25/2018 10:00 AM   HGBA1C 8.0 (A) 12/18/2017 08:57 AM   MICROALBUR 10.9 04/07/2019 10:58 AM   MICROALBUR 1.2 07/30/2018 09:21 AM     Checking BG: Daily  Recent FBG Readings: 170 - 220 Patient has failed these meds in past: Lantus Patient is currently uncontrolled on the following medications: glipizide, Trulicity, metformin  Last diabetic Foot exam: No results found for: HMDIABEYEEXA  Last diabetic Eye exam: No results found for: HMDIABFOOTEX   We discussed:  Hypoglycemia at 61 Not going to gym   Plan  Increase Trulicity 3.9JQ weekly Continue current medications   Hypertension    Office blood pressures are  BP Readings from Last 3 Encounters:  09/30/19 138/64  09/29/19 118/70  09/23/19 139/86    Patient has failed these meds in the past: NA  Patient checks BP at home daily  Patient home BP readings are ranging: 120-130/70 - 80  We discussed: At goal Denies hypotension  Plan  Continue current medications   Hyperlipidemia   LDL goal < 70  Lipid Panel     Component Value Date/Time   CHOL 147 06/28/2018 1019   CHOL 140 09/17/2017 1014   TRIG 305 (H) 06/28/2018 1019   HDL 32 (L) 06/28/2018 1019   HDL 32 (L) 09/17/2017 1014   LDLCALC 78 06/28/2018 1019    Hepatic Function Latest Ref Rng & Units 04/07/2019  09/17/2017 04/24/2017  Total Protein 6.1 - 8.1 g/dL 6.6 6.6 7.0  Albumin 3.6 - 4.8 g/dL - 4.0 4.4  AST 10 - 35 U/L '14 13 16  ' ALT 9 - 46 U/L '24 16 19  ' Alk Phosphatase 39 - 117 IU/L - 84 97  Total Bilirubin 0.2 - 1.2 mg/dL 0.3 0.3 0.4     The 10-year ASCVD risk score [name], et al., 2013) is: 37.3%   Values used to calculate the score:     Age: 62 years     Sex: Male     Is Non-Hispanic African American: No     Diabetic: Yes     Tobacco smoker: No     Systolic Blood Pressure: 734 mmHg     Is BP treated: Yes     HDL Cholesterol: 32 mg/dL     Total Cholesterol: 147 mg/dL   Patient has failed these meds in past: NA Patient is currently uncontrolled on the following medications:  . Lipitor 76m  We discussed:   Not quite at goal per labs. Admits non-adherence. Current values do not reflect restart Lipitor.  Plan  Recommend recheck FLP/LFT in 6 weeks Continue current medications   Medication Management   [facility] U[place] pill box? Yes Pt endorses 90% compliance Disadvantaged to [employer]  We discussed:  PAP for Trulicity, Rx [id number] Pt ID [id number], tried for year Tamsulosin, some benefit - discussed possible dose increase Gabapentin 3 times daily, not interested in dose increase Denies constipation  Plan  Consider increase tamsulosin 0.426mtwice daily Verbal order to increase Trulicity 1.2.6STeekly - complete Continue current medication management strategy  Follow up: 3 month phone visit  TeMilus HeightPharmD, BCRomilda GarretCTZimmerman Medical [phone]

## 2019-10-05 NOTE — Patient Instructions (Addendum)
Visit Information  Goals Addressed            This Visit's Progress   . Chronic Care Management       CARE PLAN ENTRY (see longitudinal plan of care for additional care plan information)  Current Barriers:  . Chronic Disease Management support, education, and care coordination needs related to Hypertension, Hyperlipidemia, Diabetes, and BPH   Hypertension BP Readings from Last 3 Encounters:  09/30/19 138/64  09/29/19 118/70  09/23/19 139/86   . Pharmacist Clinical Goal(s): o Over the next 90 days, patient will work with PharmD and providers to maintain BP goal <130/80 . Current regimen:  o Diovan-HCT 320-25mg  daily o Toprol-XL 50mg  daily with food o Spironolactone 50mg  daily . Interventions: o None . Patient self care activities - Over the next 90 days, patient will: o Check BP weekly, document, and provide at future appointments o Ensure daily salt intake < 2300 mg/day  Hyperlipidemia Lab Results  Component Value Date/Time   LDLCALC 78 06/28/2018 10:19 AM   . Pharmacist Clinical Goal(s): o Over the next 90 days, patient will work with PharmD and providers to achieve LDL goal < 70 . Current regimen:  o Lipitor 40mg  daily . Interventions: o Restart atorvastatin 40mg  daily  o Check fasting lipid profile and liver function in 6 weeks . Patient self care activities - Over the next 90 days, patient will: o Take atorvastatin 40mg  daily as directed  Diabetes Lab Results  Component Value Date/Time   HGBA1C 8.9 (H) 09/04/2019 07:12 PM   HGBA1C 7.3 (H) 04/07/2019 10:58 AM   HGBA1C 8.9 03/25/2018 10:00 AM   HGBA1C 8.9 (A) 03/25/2018 10:00 AM   HGBA1C 8.9 (A) 03/25/2018 10:00 AM   HGBA1C 8.0 (A) 12/18/2017 08:57 AM   . Pharmacist Clinical Goal(s): o Over the next 90 days, patient will work with PharmD and providers to achieve A1c goal <7% . Current regimen:  o Metformin 1000mg  twice daily o Trulicity 0.75mg  inject weekly o Glipizide 10mg   daily . Interventions: o Verbal order to [employer] to increase Trulicity 1.5mg  daily . Patient self care activities - Over the next 90 days, patient will: o Check blood sugar once daily, document, and provide at future appointments o Contact provider with any episodes of hypoglycemia  BPH . Pharmacist Clinical Goal(s) o Over the next 90 days, patient will work with PharmD and providers to improve bladder control . Current regimen:  o Tamsulosin 0.4mg  daily . Interventions: o Consider increase tamsulosin 0.4mg  daily . Patient self care activities - Over the next 90 days, patient will: o Work with provider and PharmD to achieve optimal bladder control  Medication management . Pharmacist Clinical Goal(s): o Over the next 90 days, patient will work with PharmD and providers to maintain optimal medication adherence . Current pharmacy: [facility] . Interventions o Comprehensive medication review performed. o Continue current medication management strategy . Patient self care activities - Over the next 90 days, patient will: o Focus on medication adherence by ensuring [employer] sends increased dose of Trulicity o Take medications as prescribed o Report any questions or concerns to PharmD and/or provider(s)  Initial goal documentation        Mr. Bula was given information about Chronic Care Management services today including:  1. CCM service includes personalized support from designated clinical staff supervised by his physician, including individualized plan of care and coordination with other care providers 2. 24/7 contact phone numbers for assistance for urgent and routine care needs. 3. Standard  insurance, coinsurance, copays and deductibles apply for chronic care management only during months in which we provide at least 20 minutes of these services. Most insurances cover these services at 100%, however patients may be responsible for any copay, coinsurance and/or deductible if applicable.  This service may help you avoid the need for more expensive face-to-face services. 4. Only one practitioner may furnish and bill the service in a calendar month. 5. The patient may stop CCM services at any time (effective at the end of the month) by phone call to the office staff.  Patient agreed to services and verbal consent obtained.   Print copy of patient instructions provided.  Telephone follow up appointment with pharmacy team member scheduled for: 3 months  Milus Height, PharmD, [place], [facility] [phone]  BMI for Adults What is BMI? Body mass index (BMI) is a number that is calculated from a person's weight and height. BMI can help estimate how much of a person's weight is composed of fat. BMI does not measure body fat directly. Rather, it is an alternative to procedures that directly measure body fat, which can be difficult and expensive. BMI can help identify people who may be at higher risk for certain medical problems. What are BMI measurements used for? BMI is used as a screening tool to identify possible weight problems. It helps determine whether a person is obese, overweight, a healthy weight, or underweight. BMI is useful for:  Identifying a weight problem that may be related to a medical condition or may increase the risk for medical problems.  Promoting changes, such as changes in diet and exercise, to help reach a healthy weight. BMI screening can be repeated to see if these changes are working. How is BMI calculated? BMI involves measuring your weight in relation to your height. Both height and weight are measured, and the BMI is calculated from those numbers. This can be done either in [place] ([place] or metric measurements. Note that charts and online BMI calculators are available to help you find your BMI quickly and easily without having to do these calculations yourself. To calculate your BMI in English ([place]  measurements:  1. Measure your weight in pounds (lb). 2. Multiply the number of pounds by 703. ? For example, for a person who weighs 180 lb, multiply that number by 703, which equals 126,540. 3. Measure your height in inches. Then multiply that number by itself to get a measurement called "inches squared." ? For example, for a person who is 70 inches tall, the "inches squared" measurement is 70 inches x 70 inches, which equals 4,900 inches squared. 4. Divide the total from step 2 (number of lb x 703) by the total from step 3 (inches squared): 126,540  4,900 = 25.8. This is your BMI. To calculate your BMI in metric measurements: 1. Measure your weight in kilograms (kg). 2. Measure your height in meters (m). Then multiply that number by itself to get a measurement called "meters squared." ? For example, for a person who is 1.75 m tall, the "meters squared" measurement is 1.75 m x 1.75 m, which is equal to 3.1 meters squared. 3. Divide the number of kilograms (your weight) by the meters squared number. In this example: 70  3.1 = 22.6. This is your BMI. What do the results mean? BMI charts are used to identify whether you are underweight, normal weight, overweight, or obese. The following guidelines will be used:  Underweight: BMI less than 18.5.  Normal weight: BMI between 18.5 and 24.9.  Overweight: BMI between 25 and 29.9.  Obese: BMI of 30 or above. Keep these notes in mind:  Weight includes both fat and muscle, so someone with a muscular build, such as an athlete, may have a BMI that is higher than 24.9. In cases like these, BMI is not an accurate measure of body fat.  To determine if excess body fat is the cause of a BMI of 25 or higher, further assessments may need to be done by a health care provider.  BMI is usually interpreted in the same way for men and women. Where to find more information For more information about BMI, including tools to quickly calculate your BMI, go to  these websites:  Centers for Disease Control and Prevention: http://www.wolf.info/  American Heart Association: www.heart.org  National Heart, Lung, and Blood Institute: https://wilson-eaton.com/ Summary  Body mass index (BMI) is a number that is calculated from a person's weight and height.  BMI may help estimate how much of a person's weight is composed of fat. BMI can help identify those who may be at higher risk for certain medical problems.  BMI can be measured using English measurements or metric measurements.  BMI charts are used to identify whether you are underweight, normal weight, overweight, or obese. This information is not intended to replace advice given to you by your health care provider. Make sure you discuss any questions you have with your health care provider. Document Revised: 12/11/2018 Document Reviewed: 10/18/2018 Elsevier Patient Education  [place].

## 2019-10-14 NOTE — Progress Notes (Signed)
   10/15/2019  CC:  Chief Complaint  Patient presents with  . Cysto    TGG:[id number] Evan Rivas is a 69 y.o. male with a history of BPH and nephrolithiasis returns for a cysto.  Nephrolithiasis requiring staged right ureteroscopy with me in 2016.  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  While hospitalized, patient reported a years long history of weak and intermittent urinary stream.  Operative note with Dr. Erlene Quan in 2016 with intraoperative findings of mild bulbar urethral stricture and enlarged prostatic urethra with slight asymmetry/enlargement of the left lateral lobe.  CT renal stone study on 09/04/2019 revealed new small foci of air within the bilateral renal collecting systems, consistent with early emphysematous pyelitis. Bilateral nonobstructive nephrolithiasis. Hepatic steatosis.  Today the patient was doing well.    Blood pressure 116/77, pulse 99, height 6\' 3"  (1.905 m), weight 272 lb (123.4 kg).   NED. A&Ox3.   No respiratory distress   Abd soft, NT, ND Normal phallus with bilateral descended testicles  Cystoscopy Procedure Note  Patient identification was confirmed, informed consent was obtained, and patient was prepped using Betadine solution.  Lidocaine jelly was administered per urethral meatus.     Pre-Procedure: - Inspection reveals a normal caliber ureteral meatus.  Procedure: The flexible cystoscope was introduced without difficulty -Mild concentric urethral strictures, just able to accommodate 16 [place] scope - Enlarged prostate with bilobar coaptation - Normal bladder neck - Bilateral ureteral orifices identified - Bladder mucosa  reveals no ulcers, tumors, or lesions - No bladder stones - Mild trabeculation   Post-Procedure: - Patient tolerated the procedure well   Assessment/ Plan:  1. Weak urinary stream Goal is to wean patient off Flomax.  We discussed  alternatives including TURP, PVP and holmium laser enucleation of the prostate for large glands. Minimally invasive options of UroLift and water vapor ablation were discussed. Differences between the surgical procedures were discussed as well as the risks and benefits of each.  He is most interested in Urolift.   Reviewed most recent CT scan indicates his prostate is no greater than about 60 cc based on the size estimate.  He is Candidate for UroLift. He was provided with literature today. He would like to schedule surgery.  Risk including risk of bleeding, damage surrounding structures, dysuria, failure of the procedure, need for further procedures, damage surrounding structures were all discussed.  Evan Rivas, am acting as a scribe for Evan Rivas.  I have reviewed the above documentation for accuracy and completeness, and I agree with the above.   Hollice Espy, MD

## 2019-10-15 ENCOUNTER — Ambulatory Visit: Payer: Medicare PPO | Admitting: Urology

## 2019-10-15 ENCOUNTER — Other Ambulatory Visit: Payer: Self-pay

## 2019-10-15 VITALS — BP 116/77 | HR 99 | Ht 75.0 in | Wt 272.0 lb

## 2019-10-15 DIAGNOSIS — R3912 Poor urinary stream: Secondary | ICD-10-CM | POA: Diagnosis not present

## 2019-10-16 ENCOUNTER — Other Ambulatory Visit: Payer: Self-pay | Admitting: Radiology

## 2019-10-16 DIAGNOSIS — N138 Other obstructive and reflux uropathy: Secondary | ICD-10-CM

## 2019-10-17 LAB — URINALYSIS, COMPLETE
Bilirubin, UA: NEGATIVE
Ketones, UA: NEGATIVE
Leukocytes,UA: NEGATIVE
Nitrite, UA: NEGATIVE
Protein,UA: NEGATIVE
RBC, UA: NEGATIVE
Specific Gravity, UA: 1.015 (ref 1.005–1.030)
Urobilinogen, Ur: 0.2 mg/dL (ref 0.2–1.0)
pH, UA: 5 (ref 5.0–7.5)

## 2019-10-17 LAB — MICROSCOPIC EXAMINATION: Bacteria, UA: NONE SEEN

## 2019-10-22 ENCOUNTER — Ambulatory Visit: Payer: Medicare PPO | Admitting: Internal Medicine

## 2019-10-24 DIAGNOSIS — G5791 Unspecified mononeuropathy of right lower limb: Secondary | ICD-10-CM

## 2019-10-24 DIAGNOSIS — M549 Dorsalgia, unspecified: Secondary | ICD-10-CM | POA: Insufficient documentation

## 2019-10-24 HISTORY — DX: Unspecified mononeuropathy of right lower limb: G57.91

## 2019-11-05 ENCOUNTER — Telehealth: Payer: Self-pay

## 2019-11-05 NOTE — Telephone Encounter (Signed)
Patient says he went yesterday afternoon for and your antigen test was negative and PCR came back last night Positive. Patient denies any covid symptoms. He got tested because his brother and sister-in-law he visited in [place] tested positive for covid. He wanted to call and let Dr. Roxan Hockey know.   Copied from [place] [id number]. Topic: General - Other >> Nov 05, 2019 11:37 AM Celene Kras wrote: Reason for CRM: Pt called and is requesting to have a call back from PCP regarding his covid test. He states that the PCR covid test came back positive, bu this antigen test came back negative. Please advise.

## 2019-11-05 NOTE — Telephone Encounter (Signed)
Melissa, Could you please try to call the health department, and ask them their advice as to how we should handle these calls. Do we need to notify them in the this type of situation, and is any type of contract tracing then needed. Patient is asymptomatic. Thanks

## 2019-11-07 NOTE — Telephone Encounter (Signed)
I spoke with the Health Department and was informed wherever the patient has their testing with will report any Positive test results to the health department and a case manager is then assigned to that person for contact tracing.

## 2019-11-10 ENCOUNTER — Encounter
Admission: RE | Admit: 2019-11-10 | Discharge: 2019-11-10 | Disposition: A | Discharge status: Home / Self Care | Payer: Medicare PPO | Source: Ambulatory Visit | Attending: Urology | Admitting: Urology

## 2019-11-10 ENCOUNTER — Other Ambulatory Visit: Payer: Self-pay

## 2019-11-10 HISTORY — DX: Sleep apnea, unspecified: G47.30

## 2019-11-10 NOTE — Patient Instructions (Addendum)
Your procedure is scheduled on: 11/24/19 Report to [place]. To find out your arrival time please call [phone] between 1PM - 3PM on8/20/21 .  Remember: Instructions that are not followed completely may result in serious medical risk, up to and including death, or upon the discretion of your surgeon and anesthesiologist your surgery may need to be rescheduled.     _X__ 1. Do not eat food after midnight the night before your procedure.                 No gum chewing or hard candies. You may drink clear liquids up to 2 hours                 before you are scheduled to arrive for your surgery- DO not drink clear                 liquids within 2 hours of the start of your surgery.                 Clear Liquids include:  water, apple juice without pulp, clear carbohydrate                 drink such as Clearfast or Gatorade, Black Coffee or Tea (Do not add                 anything to coffee or tea). Diabetics water only  __X__2.  On the morning of surgery brush your teeth with toothpaste and water, you                 may rinse your mouth with mouthwash if you wish.  Do not swallow any              toothpaste of mouthwash.     _X__ 3.  No Alcohol for 24 hours before or after surgery.   _X__ 4.  Do Not Smoke or use e-cigarettes For 24 Hours Prior to Your Surgery.                 Do not use any chewable tobacco products for at least 6 hours prior to                 surgery.  ____  5.  Bring all medications with you on the day of surgery if instructed.   __X__  6.  Notify your doctor if there is any change in your medical condition      (cold, fever, infections).     Do not wear jewelry, make-up, hairpins, clips or nail polish. Do not wear lotions, powders, or perfumes.  Do not shave 48 hours prior to surgery. Men may shave face and neck. Do not bring valuables to the hospital.    [facility] is not responsible for any belongings or  valuables.  Contacts, dentures/partials or body piercings may not be worn into surgery. Bring a case for your contacts, glasses or hearing aids, a denture cup will be supplied. Leave your suitcase in the car. After surgery it may be brought to your room. For patients admitted to the hospital, discharge time is determined by your treatment team.   Patients discharged the day of surgery will not be allowed to drive home.   Please read over the following fact sheets that you were given:   MRSA Information  __X__ Take these medicines the morning of surgery with A SIP OF WATER:  1. gabapentin (NEURONTIN) 300 MG capsule  2. HYDROcodone-acetaminophen (NORCO) 10-325 MG tablet  3. hydrOXYzine (ATARAX/VISTARIL) 25 MG tablet if needed  4.  5.  6.  ____ Fleet Enema (as directed)   __X__ Use CHG Soap/SAGE wipes as directed  ____ Use inhalers on the day of surgery  __X__ Stop metformin/Janumet/Farxiga 2 days prior to surgery    ____ Take 1/2 of usual insulin dose the night before surgery. No insulin the morning          of surgery.   ____ Stop Blood Thinners Coumadin/Plavix/Xarelto/Pleta/Pradaxa/Eliquis/Effient/Aspirin  on   Or contact your Surgeon, Cardiologist or Medical Doctor regarding  ability to stop your blood thinners  __X__ Stop Anti-inflammatories 7 days before surgery such as Advil, Ibuprofen, Motrin,  BC or Goodies Powder, Naprosyn, Naproxen, Aleve, Aspirin    __X__ Stop all herbal supplements, fish oil or vitamin E until after surgery.    ____ Bring C-Pap to the hospital.

## 2019-11-10 NOTE — Pre-Procedure Instructions (Signed)
Patient had + Covid PCR on August 3. Patient to bring copy of email sent to him from drive up Covid testing site that shows result.

## 2019-11-13 ENCOUNTER — Ambulatory Visit: Payer: Medicare PPO

## 2019-11-14 ENCOUNTER — Other Ambulatory Visit: Payer: Medicare PPO

## 2019-11-14 ENCOUNTER — Other Ambulatory Visit: Payer: Self-pay

## 2019-11-14 DIAGNOSIS — N12 Tubulo-interstitial nephritis, not specified as acute or chronic: Secondary | ICD-10-CM

## 2019-11-14 LAB — URINALYSIS, COMPLETE
Bilirubin, UA: NEGATIVE
Leukocytes,UA: NEGATIVE
Nitrite, UA: NEGATIVE
Protein,UA: NEGATIVE
Specific Gravity, UA: 1.02 (ref 1.005–1.030)
Urobilinogen, Ur: 0.2 mg/dL (ref 0.2–1.0)
pH, UA: 5 (ref 5.0–7.5)

## 2019-11-14 LAB — MICROSCOPIC EXAMINATION

## 2019-11-17 LAB — CULTURE, URINE COMPREHENSIVE

## 2019-11-20 ENCOUNTER — Other Ambulatory Visit: Payer: Self-pay

## 2019-11-20 ENCOUNTER — Other Ambulatory Visit
Admission: RE | Admit: 2019-11-20 | Discharge: 2019-11-20 | Disposition: A | Discharge status: Home / Self Care | Payer: Medicare PPO | Source: Ambulatory Visit | Attending: Urology | Admitting: Urology

## 2019-11-20 ENCOUNTER — Other Ambulatory Visit: Payer: Medicare PPO

## 2019-11-20 DIAGNOSIS — U071 COVID-19: Secondary | ICD-10-CM | POA: Insufficient documentation

## 2019-11-20 DIAGNOSIS — Z01812 Encounter for preprocedural laboratory examination: Secondary | ICD-10-CM | POA: Diagnosis present

## 2019-11-20 NOTE — Pre-Procedure Instructions (Signed)
Patient states that he was Covid + in early August 2021.  He was tested at an outside lab so we do not have any results within our system.  He was retested today for his surgery.  I explained that he may still show up as + and it could cancel his surgery.

## 2019-11-21 LAB — SARS CORONAVIRUS 2 (TAT 6-24 HRS): SARS Coronavirus 2: POSITIVE — AB

## 2019-11-24 ENCOUNTER — Encounter: Payer: Self-pay | Admitting: Urology

## 2019-11-24 ENCOUNTER — Ambulatory Visit
Admission: RE | Admit: 2019-11-24 | Discharge: 2019-11-24 | Disposition: A | Discharge status: Home / Self Care | Payer: Medicare PPO | Attending: Urology | Admitting: Urology

## 2019-11-24 ENCOUNTER — Ambulatory Visit: Payer: Medicare PPO | Admitting: Certified Registered Nurse Anesthetist

## 2019-11-24 ENCOUNTER — Other Ambulatory Visit: Payer: Self-pay

## 2019-11-24 ENCOUNTER — Encounter
Admission: RE | Disposition: A | Discharge status: Home / Self Care | Payer: Self-pay | Source: Home / Self Care | Attending: Urology

## 2019-11-24 DIAGNOSIS — N401 Enlarged prostate with lower urinary tract symptoms: Secondary | ICD-10-CM | POA: Diagnosis not present

## 2019-11-24 DIAGNOSIS — Z96653 Presence of artificial knee joint, bilateral: Secondary | ICD-10-CM | POA: Insufficient documentation

## 2019-11-24 DIAGNOSIS — Z6833 Body mass index (BMI) 33.0-33.9, adult: Secondary | ICD-10-CM | POA: Insufficient documentation

## 2019-11-24 DIAGNOSIS — F419 Anxiety disorder, unspecified: Secondary | ICD-10-CM | POA: Insufficient documentation

## 2019-11-24 DIAGNOSIS — K219 Gastro-esophageal reflux disease without esophagitis: Secondary | ICD-10-CM | POA: Diagnosis not present

## 2019-11-24 DIAGNOSIS — G709 Myoneural disorder, unspecified: Secondary | ICD-10-CM | POA: Diagnosis not present

## 2019-11-24 DIAGNOSIS — E269 Hyperaldosteronism, unspecified: Secondary | ICD-10-CM | POA: Insufficient documentation

## 2019-11-24 DIAGNOSIS — Z87442 Personal history of urinary calculi: Secondary | ICD-10-CM | POA: Diagnosis not present

## 2019-11-24 DIAGNOSIS — R3912 Poor urinary stream: Secondary | ICD-10-CM | POA: Insufficient documentation

## 2019-11-24 DIAGNOSIS — N200 Calculus of kidney: Secondary | ICD-10-CM | POA: Diagnosis not present

## 2019-11-24 DIAGNOSIS — G4730 Sleep apnea, unspecified: Secondary | ICD-10-CM | POA: Diagnosis not present

## 2019-11-24 DIAGNOSIS — E278 Other specified disorders of adrenal gland: Secondary | ICD-10-CM | POA: Diagnosis not present

## 2019-11-24 DIAGNOSIS — F329 Major depressive disorder, single episode, unspecified: Secondary | ICD-10-CM | POA: Diagnosis not present

## 2019-11-24 DIAGNOSIS — E669 Obesity, unspecified: Secondary | ICD-10-CM | POA: Diagnosis not present

## 2019-11-24 DIAGNOSIS — Z87898 Personal history of other specified conditions: Secondary | ICD-10-CM | POA: Insufficient documentation

## 2019-11-24 DIAGNOSIS — M1990 Unspecified osteoarthritis, unspecified site: Secondary | ICD-10-CM | POA: Diagnosis not present

## 2019-11-24 DIAGNOSIS — E785 Hyperlipidemia, unspecified: Secondary | ICD-10-CM | POA: Insufficient documentation

## 2019-11-24 DIAGNOSIS — I10 Essential (primary) hypertension: Secondary | ICD-10-CM | POA: Insufficient documentation

## 2019-11-24 DIAGNOSIS — N138 Other obstructive and reflux uropathy: Secondary | ICD-10-CM

## 2019-11-24 DIAGNOSIS — F410 Panic disorder [episodic paroxysmal anxiety] without agoraphobia: Secondary | ICD-10-CM | POA: Insufficient documentation

## 2019-11-24 DIAGNOSIS — N35912 Unspecified bulbous urethral stricture, male: Secondary | ICD-10-CM | POA: Diagnosis not present

## 2019-11-24 DIAGNOSIS — K760 Fatty (change of) liver, not elsewhere classified: Secondary | ICD-10-CM | POA: Diagnosis not present

## 2019-11-24 HISTORY — PX: CYSTOSCOPY WITH INSERTION OF UROLIFT: SHX6678

## 2019-11-24 LAB — GLUCOSE, CAPILLARY
Glucose-Capillary: 301 mg/dL — ABNORMAL HIGH (ref 70–99)
Glucose-Capillary: 316 mg/dL — ABNORMAL HIGH (ref 70–99)
Glucose-Capillary: 303 mg/dL — ABNORMAL HIGH (ref 70–99)

## 2019-11-24 SURGERY — CYSTOSCOPY WITH INSERTION OF UROLIFT
Anesthesia: General

## 2019-11-24 MED ORDER — FENTANYL CITRATE (PF) 100 MCG/2ML IJ SOLN: 25.0000 ug | INTRAMUSCULAR | Status: DC | PRN

## 2019-11-24 MED ORDER — PHENYLEPHRINE HCL (PRESSORS) 10 MG/ML IV SOLN
INTRAVENOUS | Status: DC | PRN
  Administered 2019-11-24 (×4): 100 ug via INTRAVENOUS
  Administered 2019-11-24: 200 ug via INTRAVENOUS

## 2019-11-24 MED ORDER — CHLORHEXIDINE GLUCONATE 0.12 % MT SOLN
OROMUCOSAL | Status: AC
  Administered 2019-11-24: 15 mL via OROMUCOSAL
  Filled 2019-11-24: qty 15

## 2019-11-24 MED ORDER — CHLORHEXIDINE GLUCONATE 0.12 % MT SOLN: 15.0000 mL | Freq: Once | OROMUCOSAL | Status: AC

## 2019-11-24 MED ORDER — LIDOCAINE HCL (CARDIAC) PF 100 MG/5ML IV SOSY
PREFILLED_SYRINGE | INTRAVENOUS | Status: DC | PRN
  Administered 2019-11-24: 100 mg via INTRAVENOUS

## 2019-11-24 MED ORDER — LACTATED RINGERS IV SOLN: INTRAVENOUS | Status: DC | PRN

## 2019-11-24 MED ORDER — ONDANSETRON HCL 4 MG/2ML IJ SOLN
INTRAMUSCULAR | Status: DC | PRN
  Administered 2019-11-24: 4 mg via INTRAVENOUS

## 2019-11-24 MED ORDER — CEFAZOLIN SODIUM-DEXTROSE 2-4 GM/100ML-% IV SOLN
2.0000 g | INTRAVENOUS | Status: AC
  Administered 2019-11-24: 2 g via INTRAVENOUS

## 2019-11-24 MED ORDER — PROPOFOL 10 MG/ML IV BOLUS
INTRAVENOUS | Status: DC | PRN
  Administered 2019-11-24: 160 mg via INTRAVENOUS

## 2019-11-24 MED ORDER — SODIUM CHLORIDE 0.9 % IV SOLN: INTRAVENOUS | Status: DC

## 2019-11-24 MED ORDER — FENTANYL CITRATE (PF) 100 MCG/2ML IJ SOLN
INTRAMUSCULAR | Status: AC
  Filled 2019-11-24: qty 2

## 2019-11-24 MED ORDER — ORAL CARE MOUTH RINSE: 15.0000 mL | Freq: Once | OROMUCOSAL | Status: AC

## 2019-11-24 MED ORDER — SUCCINYLCHOLINE CHLORIDE 20 MG/ML IJ SOLN
INTRAMUSCULAR | Status: DC | PRN
  Administered 2019-11-24: 140 mg via INTRAVENOUS

## 2019-11-24 MED ORDER — EPHEDRINE SULFATE 50 MG/ML IJ SOLN
INTRAMUSCULAR | Status: DC | PRN
  Administered 2019-11-24: 10 mg via INTRAVENOUS

## 2019-11-24 MED ORDER — ROCURONIUM BROMIDE 100 MG/10ML IV SOLN
INTRAVENOUS | Status: DC | PRN
  Administered 2019-11-24 (×2): 10 mg via INTRAVENOUS

## 2019-11-24 MED ORDER — SUGAMMADEX SODIUM 200 MG/2ML IV SOLN
INTRAVENOUS | Status: DC | PRN
  Administered 2019-11-24: 200 mg via INTRAVENOUS

## 2019-11-24 MED ORDER — OXYBUTYNIN CHLORIDE 5 MG PO TABS: 5.0000 mg | ORAL_TABLET | Freq: Three times a day (TID) | ORAL | 0 refills | Status: DC | PRN

## 2019-11-24 MED ORDER — CEFAZOLIN SODIUM-DEXTROSE 2-4 GM/100ML-% IV SOLN
INTRAVENOUS | Status: AC
  Filled 2019-11-24: qty 100

## 2019-11-24 MED ORDER — FENTANYL CITRATE (PF) 100 MCG/2ML IJ SOLN
INTRAMUSCULAR | Status: DC | PRN
Start: 2019-11-24 — End: 2019-11-24
  Administered 2019-11-24 (×2): 50 ug via INTRAVENOUS

## 2019-11-24 MED ORDER — ONDANSETRON HCL 4 MG/2ML IJ SOLN: 4.0000 mg | Freq: Once | INTRAMUSCULAR | Status: DC | PRN

## 2019-11-24 SURGICAL SUPPLY — 17 items
BAG DRAIN CYSTO-URO LG1000N (MISCELLANEOUS) ×3 IMPLANT
BAG DRN RND TRDRP ANRFLXCHMBR (UROLOGICAL SUPPLIES) ×1
BAG URINE DRAIN 2000ML AR STRL (UROLOGICAL SUPPLIES) ×3 IMPLANT
CATH FOL 2WAY LX 18X30 (CATHETERS) ×3 IMPLANT
GLOVE BIO SURGEON STRL SZ 6.5 (GLOVE) ×2 IMPLANT
GLOVE BIO SURGEONS STRL SZ 6.5 (GLOVE) ×1
GOWN STRL REUS W/ TWL LRG LVL3 (GOWN DISPOSABLE) ×2 IMPLANT
GOWN STRL REUS W/TWL LRG LVL3 (GOWN DISPOSABLE) ×6
KIT TURNOVER CYSTO (KITS) ×3 IMPLANT
PACK CYSTO AR (MISCELLANEOUS) ×3 IMPLANT
SENSORWIRE 0.038 NOT ANGLED (WIRE) ×3
SET CYSTO W/LG BORE CLAMP LF (SET/KITS/TRAYS/PACK) ×3 IMPLANT
SURGILUBE 2OZ TUBE FLIPTOP (MISCELLANEOUS) IMPLANT
SYSTEM UROLIFT (Male Continence) ×15 IMPLANT
WATER STERILE IRR 1000ML POUR (IV SOLUTION) ×3 IMPLANT
WATER STERILE IRR 3000ML UROMA (IV SOLUTION) ×3 IMPLANT
WIRE SENSOR 0.038 NOT ANGLED (WIRE) ×1 IMPLANT

## 2019-11-24 NOTE — H&P (Signed)
   H&P updated today, no change RRR CTAB  +covid, infection ~1 month ago, asymptomatic   CC:     Chief Complaint  Patient presents with  . Cysto    [id number] Evan Rivas is a 69 y.o. male with a history of BPH and nephrolithiasis returns for a cysto.  Nephrolithiasis requiring staged right ureteroscopy with me in 2016.  Patient was hospitalization earlier in 09/2019 with bilateral emphysematous pyelitiswith pansensitive E. Coli.  While hospitalized, patient reported a years long history of weak and intermittent urinary stream. Operative note with Dr. Erlene Quan in 2016 withintraoperative findings of mild bulbar urethral stricture and enlarged prostatic urethra with slight asymmetry/enlargement of the left lateral lobe.  CT renal stone study on 09/04/2019 revealed new small foci of air within the bilateral renal collecting systems, consistent with early emphysematous pyelitis. Bilateral nonobstructive nephrolithiasis. Hepatic steatosis.  Today the patient was doing well.    Blood pressure 116/77, pulse 99, height 6\' 3"  (1.905 m), weight 272 lb (123.4 kg).   NED. A&Ox3.   No respiratory distress   Abd soft, NT, ND Normal phallus with bilateral descended testicles  Cystoscopy Procedure Note  Patient identification was confirmed, informed consent was obtained, and patient was prepped using Betadine solution.  Lidocaine jelly was administered per urethral meatus.     Pre-Procedure: - Inspection reveals a normal caliber ureteral meatus.  Procedure: The flexible cystoscope was introduced without difficulty -Mild concentric urethral strictures, just able to accommodate 16 [place] scope - Enlarged prostate with bilobar coaptation - Normal bladder neck - Bilateral ureteral orifices identified - Bladder mucosa  reveals no ulcers, tumors, or lesions - No bladder stones - Mild trabeculation   Post-Procedure: - Patient tolerated the procedure  well   Assessment/ Plan:  1. Weak urinary stream Goal is to wean patient off Flomax.  We discussed alternatives including TURP,PVP and holmium laser enucleation of the prostatefor large glands.Minimally invasive options of UroLift and water vapor ablation were discussed. Differences between the surgical procedures were discussed as well as the risks and benefits of each. He is most interested in Urolift.  Reviewed most recent CT scan indicates his prostate is no greater than about 60 cc based on the size estimate.  He is Candidate for UroLift. He was provided with literature today. He would like to schedule surgery.  Risk including risk of bleeding, damage surrounding structures, dysuria, failure of the procedure, need for further procedures, damage surrounding structures were all discussed.  Fransico Him, am acting as a scribe for Dr. Hollice Espy.  I have reviewed the above documentation for accuracy and completeness, and I agree with the above.   Hollice Espy, MD

## 2019-11-24 NOTE — Discharge Instructions (Signed)
AMBULATORY SURGERY  DISCHARGE INSTRUCTIONS   1) The drugs that you were given will stay in your system until tomorrow so for the next 24 hours you should not:  A) Drive an automobile B) Make any legal decisions C) Drink any alcoholic beverage   2) You may resume regular meals tomorrow.  Today it is better to start with liquids and gradually work up to solid foods.  You may eat anything you prefer, but it is better to start with liquids, then soup and crackers, and gradually work up to solid foods.   3) Please notify your doctor immediately if you have any unusual bleeding, trouble breathing, redness and pain at the surgery site, drainage, fever, or pain not relieved by medication.    4) Additional Instructions:        Please contact your physician with any problems or Same Day Surgery at [phone], Monday through Friday 6 am to 4 pm, or [place] at [facility] number at [phone].Urolift Post-Operative Instructions     Patient Expectations   1. Mild blood in your urine for about 1 week.  2. Urinary buring, frequency, and urgency for 10 days.  3. Mild pelvic pain 1-2 weeks.     Return to Activity     1. Drink water post procedure.  2. Take meds as needed.  Tylenol and/or Motrin is most helpful.  You may also by Pyridium/Azo over-the-counter for urinary burning.  3. No lifting or straining 48hrs.  4. Other activity when they feel up to it.  5. Remove Foley Tuesday AM, call if you have any issues or concerns  5. OK to stop Flomax/ tamsulosin at one week post op

## 2019-11-24 NOTE — Anesthesia Postprocedure Evaluation (Signed)
Anesthesia Post Note  Patient: Evan Rivas  Procedure(s) Performed: CYSTOSCOPY WITH INSERTION OF UROLIFT (N/A )  Patient location during evaluation: PACU Anesthesia Type: General Level of consciousness: awake and alert Pain management: pain level controlled Vital Signs Assessment: post-procedure vital signs reviewed and stable Respiratory status: spontaneous breathing, nonlabored ventilation, respiratory function stable and patient connected to nasal cannula oxygen Cardiovascular status: blood pressure returned to baseline and stable Postop Assessment: no apparent nausea or vomiting Anesthetic complications: no   No complications documented.   Last Vitals:  Vitals:   11/24/19 1246 11/24/19 1327  BP: (!) 141/78 121/74  Pulse: 97 90  Resp: 18 18  Temp: (!) 36.2 C 36.6 C  SpO2: 95% 98%    Last Pain:  Vitals:   11/24/19 1327  TempSrc: Temporal  PainSc: 0-No pain                 Martha Clan

## 2019-11-24 NOTE — Transfer of Care (Signed)
Immediate Anesthesia Transfer of Care Note  Patient: Evan Rivas  Procedure(s) Performed: CYSTOSCOPY WITH INSERTION OF UROLIFT (N/A )  Patient Location: PACU  Anesthesia Type:General  Level of Consciousness: awake, alert  and oriented  Airway & Oxygen Therapy: Patient Spontanous Breathing and Patient connected to face mask oxygen  Post-op Assessment: Report given to RN and Post -op Vital signs reviewed and stable  Post vital signs: Reviewed and stable  Last Vitals:  Vitals Value Taken Time  BP 158/74 11/24/19 1137  Temp    Pulse 96 11/24/19 1139  Resp 12 11/24/19 1139  SpO2 100 % 11/24/19 1139  Vitals shown include unvalidated device data.  Last Pain:  Vitals:   11/24/19 1020  TempSrc: Oral  PainSc: 2          Complications: No complications documented.

## 2019-11-24 NOTE — OR Nursing (Signed)
Notified Dr Rosey Bath of blood sugar of 303, no treatment now may resume home meds.  Called into OR to clarify when patient can restart aspirin per Bethena Midget RN in [place] with DR Erlene Quan, may restart 11/25/19.

## 2019-11-24 NOTE — Anesthesia Procedure Notes (Signed)
Procedure Name: Intubation Date/Time: 11/24/2019 11:04 AM Performed by: Willette Alma, CRNA Pre-anesthesia Checklist: Patient identified, Patient being monitored, Timeout performed, Emergency Drugs available and Suction available Patient Re-evaluated:Patient Re-evaluated prior to induction Oxygen Delivery Method: Circle system utilized Preoxygenation: Pre-oxygenation with 100% oxygen Induction Type: IV induction Ventilation: Mask ventilation without difficulty Laryngoscope Size: 4 and McGraph Grade View: Grade I Tube type: Oral Tube size: 7.5 mm Number of attempts: 1 Airway Equipment and Method: Stylet Placement Confirmation: ETT inserted through vocal cords under direct vision,  positive ETCO2 and breath sounds checked- equal and bilateral Secured at: 21 cm Tube secured with: Tape Dental Injury: Teeth and Oropharynx as per pre-operative assessment

## 2019-11-24 NOTE — OR Nursing (Signed)
Patient and wife provided with foley care instructions and supplies for removal in am.  Instructions given as well on removal and number provided in case of any questions.  Patient and wife verbalize understanding and feel comfortable because they have removed stents in the past.

## 2019-11-24 NOTE — OR Nursing (Signed)
Reported 316 FSBS to Dr. Rosey Bath.  No treatment needed.

## 2019-11-24 NOTE — Op Note (Signed)
Preoperative diagnosis: BPH with obstructive symptomatology   Postoperative diagnosis: BPH with obstructive symptomatology, bulbar urethra stricture   Principal procedure: Urolift procedure, with the placement of 5 implants.   Surgeon: Hollice Espy   Anesthesia: LMA   Complications: None   Drains: 18 Fr Foley catheter   Estimated blood loss: < 5 mL   Indications: 69 year-old male with obstructive symptomatology secondary to BPH.  The patient's symptoms have progressed, and he has requested further management.  Management options including TURP with resection/ablation of the prostate as well as Urolift were discussed.  The patient has chosen to have a Urolift procedure.  He has been instructed to the procedure as well as risks and complications which include but are not limited to infection, bleeding, and inadequate treatment with the Urolift procedure alone, anesthetic complications, among others.  He understands these and desires to proceed.   Findings: Using the 17 French cystoscope, urethra and bladder were inspected.  Mild bulbar urethral stricture, required dilation using 21 French cystoscope and wire to traverse. Prostatic urethra was obstructed secondary to bilobar hypertrophy.  The bladder was inspected circumferentially.  This revealed normal findings.   Description of procedure: The patient was properly identified in the holding area.  He received preoperative IV antibiotics.  He was taken to the operating room where general anesthetic was administered with the LMA.  He is placed in the dorsolithotomy position.  Genitalia and perineum were prepped and draped.  Proper timeout was performed.   A 34F cystoscope was inserted into the bladder with findings as described above.  Very mild bulbar urethral stricture was encountered.  I was unable to pass the scope beyond this easily.  I exchange the scope for irregular 21 [place] scope and using the most precaution, place a wire through the  strictured area.  I then was able to navigate the wire across the stricture thereby dilating it somewhat.  I then exchanged back for the 17 [place] which time the scope passed easily.   The 1st pair of implants were placed at the bladder neck ~1.5 cm from the bladder Neck. The 2nd pair of implants were placed at the level of the verumontanum.   A repeat cysto was performed and another single implant was applied to the left distal urethra at the level of the verumontanum at this time sweeping up a small nodule.  There was some slight prostatic asymmetry at baseline and this helped create a nice anterior channel throughout the length of the fossa.   A final cystoscopy was conducted first to inspect the location and state of each implant and second, to confirm the presence of a continuous anterior channel was present through the prostatic urethra with irrigation flow turned off.    5 implants were delivered in total.   The need for urethral dilation, I did elect to place a 18 French Foley catheter at the end of the procedure with 30 cc in the balloon.   Following this, the scope was removed.  After anesthetic reversal he was transported to the PACU in stable condition.  He tolerated the procedure well.   Plan: He will follow-up with me in 4 to 6 weeks for IPSS/PVR.  We will plan to let him remove his own Foley tomorrow morning.  He will be provided with the equipment to remove it as well as taught by the nurses.  If he has any issues, call our office to schedule a visit.

## 2019-11-24 NOTE — Anesthesia Preprocedure Evaluation (Signed)
Anesthesia Evaluation  Patient identified by MRN, date of birth, ID band Patient awake    Reviewed: Allergy & Precautions, NPO status , Patient's Chart, lab work & pertinent test results  History of Anesthesia Complications Negative for: history of anesthetic complications  Airway Mallampati: II  TM Distance: >3 FB Neck ROM: Full    Dental  (+) Implants, Dental Advidsory Given, Teeth Intact,    Pulmonary neg shortness of breath, sleep apnea , neg COPD, neg recent URI,  Hx of Bronchitis and pneumonia in the past Also allergic rhinitis   Pulmonary exam normal breath sounds clear to auscultation       Cardiovascular hypertension, (-) angina(-) Past MI and (-) Cardiac Stents Normal cardiovascular exam(-) dysrhythmias (-) Valvular Problems/Murmurs     Neuro/Psych neg Seizures PSYCHIATRIC DISORDERS Anxiety Depression  Neuromuscular disease    GI/Hepatic Neg liver ROS, GERD  ,  Endo/Other  diabetes, Poorly Controlled, Type 2  Renal/GU Renal disease (kidney stones)stones  negative genitourinary   Musculoskeletal  (+) Arthritis , Osteoarthritis,    Abdominal (+) + obese,   Peds negative pediatric ROS (+)  Hematology negative hematology ROS (+)   Anesthesia Other Findings Past Medical History: No date: Anxiety     Comment:  panic attacks No date: Arthritis No date: Cancer ([place])     Comment:  lip/basal No date: Depression No date: Diabetes mellitus without complication (HCC)     Comment:  type 2 No date: GERD (gastroesophageal reflux disease) No date: Gout No date: History of kidney stones No date: Hyperaldosteronism (HCC)     Comment:  bilateral adrenal hyperplasia No date: Hyperlipidemia No date: Hypertension No date: Pneumonia     Comment:  2014 and 2018 ( 2018-klebsiella pneumonia  No date: Sleep apnea   Reproductive/Obstetrics negative OB ROS                             Anesthesia  Physical  Anesthesia Plan  ASA: III  Anesthesia Plan: General   Post-op Pain Management:    Induction: Intravenous  PONV Risk Score and Plan: 2 and Ondansetron, Dexamethasone and Treatment may vary due to age or medical condition  Airway Management Planned: Oral ETT and LMA  Additional Equipment:   Intra-op Plan:   Post-operative Plan: Extubation in OR  Informed Consent: I have reviewed the patients History and Physical, chart, labs and discussed the procedure including the risks, benefits and alternatives for the proposed anesthesia with the patient or authorized representative who has indicated his/her understanding and acceptance.     Dental advisory given  Plan Discussed with: CRNA and Surgeon  Anesthesia Plan Comments:         Anesthesia Quick Evaluation

## 2019-11-25 ENCOUNTER — Encounter: Payer: Self-pay | Admitting: Urology

## 2019-11-26 ENCOUNTER — Other Ambulatory Visit: Payer: Self-pay

## 2019-11-26 DIAGNOSIS — N400 Enlarged prostate without lower urinary tract symptoms: Secondary | ICD-10-CM

## 2019-11-26 MED ORDER — ATORVASTATIN CALCIUM 40 MG PO TABS
40.0000 mg | ORAL_TABLET | Freq: Every evening | ORAL | 3 refills | Status: DC
Start: 2019-11-26 — End: 2020-09-13

## 2019-11-26 MED ORDER — TAMSULOSIN HCL 0.4 MG PO CAPS: 0.4000 mg | ORAL_CAPSULE | Freq: Every day | ORAL | 0 refills | Status: DC

## 2019-11-26 NOTE — Telephone Encounter (Signed)
Noted by urology that after the procedure on 11/24/2019, the goal was to wean the patient from the Flomax. Did provide a 90-day supply with no refills as await urology's plan for the wean over time.

## 2019-11-27 DIAGNOSIS — M7061 Trochanteric bursitis, right hip: Secondary | ICD-10-CM | POA: Insufficient documentation

## 2019-12-01 ENCOUNTER — Other Ambulatory Visit: Payer: Self-pay | Admitting: Family Medicine

## 2019-12-01 ENCOUNTER — Other Ambulatory Visit: Payer: Self-pay

## 2019-12-01 DIAGNOSIS — F331 Major depressive disorder, recurrent, moderate: Secondary | ICD-10-CM

## 2019-12-01 DIAGNOSIS — I10 Essential (primary) hypertension: Secondary | ICD-10-CM

## 2019-12-01 DIAGNOSIS — F419 Anxiety disorder, unspecified: Secondary | ICD-10-CM

## 2019-12-01 MED ORDER — GLIPIZIDE 10 MG PO TABS
10.0000 mg | ORAL_TABLET | Freq: Every day | ORAL | 1 refills | Status: DC
Start: 2019-12-01 — End: 2020-03-04

## 2019-12-02 ENCOUNTER — Ambulatory Visit: Payer: Medicare PPO

## 2019-12-15 ENCOUNTER — Encounter: Payer: Self-pay | Admitting: Internal Medicine

## 2019-12-15 ENCOUNTER — Other Ambulatory Visit: Payer: Self-pay

## 2019-12-15 ENCOUNTER — Ambulatory Visit: Payer: Medicare PPO | Admitting: Internal Medicine

## 2019-12-15 VITALS — BP 100/68 | HR 119 | Temp 97.6°F | Resp 16 | Ht 75.0 in | Wt 267.6 lb

## 2019-12-15 DIAGNOSIS — Z23 Encounter for immunization: Secondary | ICD-10-CM

## 2019-12-15 DIAGNOSIS — M25551 Pain in right hip: Secondary | ICD-10-CM | POA: Diagnosis not present

## 2019-12-15 DIAGNOSIS — E669 Obesity, unspecified: Secondary | ICD-10-CM

## 2019-12-15 DIAGNOSIS — G8929 Other chronic pain: Secondary | ICD-10-CM | POA: Diagnosis not present

## 2019-12-15 DIAGNOSIS — E1169 Type 2 diabetes mellitus with other specified complication: Secondary | ICD-10-CM | POA: Diagnosis not present

## 2019-12-15 DIAGNOSIS — F1120 Opioid dependence, uncomplicated: Secondary | ICD-10-CM

## 2019-12-15 MED ORDER — ACCU-CHEK AVIVA PLUS VI STRP: ORAL_STRIP | 12 refills | Status: DC
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
## 2019-12-15 NOTE — Patient Instructions (Signed)
Please follow-up with the orthopedic doctor again as he noted was the next step if not improved with the injection.  Also, I do think direct contact with the pharmacist is again needed to help get the higher dose of Trulicity that is felt best presently.

## 2019-12-22 ENCOUNTER — Ambulatory Visit: Payer: Self-pay | Admitting: Pharmacist

## 2019-12-22 ENCOUNTER — Telehealth: Payer: Self-pay

## 2019-12-22 NOTE — Progress Notes (Signed)
Patient assistant paperwork mailed to patient

## 2019-12-22 NOTE — Progress Notes (Signed)
Performed chart review for medication changes. Filled out paperwork to apply for patient assistance program for Trulicity. Paperwork mailed to patient with instructions to return to [facility] with signatures and financial statement copies for [facility].

## 2019-12-23 NOTE — Progress Notes (Signed)
12/24/2019 10:04 AM   Evan Rivas 11-18-1950 [id number]  Referring provider: Towanda Malkin, MD [address] [place],  [place] [address] Chief Complaint  Patient presents with  . Benign Prostatic Hypertrophy    HPI: Evan Rivas is a 68 y.o. male who returns 4 weeks post op Urolift and follow up of weak urinary stream.   Nephrolithiasis requiring staged right ureteroscopy with me in 2016.  Patient was hospitalization earlier in 09/2019 with bilateral emphysematous pyelitiswith pansensitive E. Coli.  While hospitalized, patient reported a years long history of weak and intermittent urinary stream. Operative note with Dr. Erlene Quan in 2016 withintraoperative findings of mild bulbar urethral stricture and enlarged prostatic urethra with slight asymmetry/enlargement of the left lateral lobe.  CT renal stone study on 09/04/2019 revealed new small foci of air within the bilateral renal collecting systems, consistent with early emphysematous pyelitis. Bilateral nonobstructive nephrolithiasis. Hepatic steatosis.  Patient underwent Urolift on 11/24/2019. Using the 51 French cystoscope, urethra and bladder were inspected.  Mild bulbar urethral stricture, required dilation using 21 French cystoscope and wire to traverse.Prostatic urethra was obstructed secondary to bilobar hypertrophy. The bladder was inspected circumferentially. This revealed normal findings.  Today's PVR 12 mL. His stream is better with adequate emptying. His stream is much better when he pulls his foreskin back. He stopped Flomax 2 weeks ago.  He reports that he is thought about circumcision in the past. If foreskin is becoming increasingly bothersome to him. He has been considering circumcision.  He reports nerve pain that radiates down his BLE.    IPSS    Row Name 12/24/19 0900         International Prostate Symptom Score   How often have you had the sensation of not emptying  your bladder? Less than 1 in 5     How often have you had to urinate less than every two hours? Not at All     How often have you found you stopped and started again several times when you urinated? Less than 1 in 5 times     How often have you found it difficult to postpone urination? Not at All     How often have you had a weak urinary stream? Less than 1 in 5 times     How often have you had to strain to start urination? Not at All     How many times did you typically get up at night to urinate? None     Total IPSS Score 3       Quality of Life due to urinary symptoms   If you were to spend the rest of your life with your urinary condition just the way it is now how would you feel about that? Mixed            Score:  1-7 Mild 8-19 Moderate 20-35 Severe   PMH: Past Medical History:  Diagnosis Date  . Anxiety    panic attacks  . Arthritis   . Cancer (HCC)    lip/basal  . Depression   . Diabetes mellitus without complication ([place])    type 2  . GERD (gastroesophageal reflux disease)   . Gout   . History of kidney stones   . Hyperaldosteronism ([place])    bilateral adrenal hyperplasia  . Hyperlipidemia   . Hypertension   . Pneumonia    2014 and 2018 ( 2018-klebsiella pneumonia   . Sleep apnea     Surgical History:  Past Surgical History:  Procedure Laterality Date  . ARTHROSCOPIC REPAIR ACL Left   . CYSTOSCOPY W/ URETERAL STENT PLACEMENT Right 09/07/2014   Procedure: CYSTOSCOPY WITH STENT REPLACEMENT;  Surgeon: Hollice Espy, MD;  Location: [facility];  Service: Urology;  Laterality: Right;  . CYSTOSCOPY WITH INSERTION OF UROLIFT N/A 11/24/2019   Procedure: CYSTOSCOPY WITH INSERTION OF UROLIFT;  Surgeon: Hollice Espy, MD;  Location: [facility];  Service: Urology;  Laterality: N/A;  . CYSTOSCOPY/URETEROSCOPY/HOLMIUM LASER/STENT PLACEMENT Right 08/17/2014   Procedure: CYSTOSCOPY/URETEROSCOPY//STENT PLACEMENT/ attempt of lithotripsy;  Surgeon: Hollice Espy, MD;  Location:  [facility];  Service: Urology;  Laterality: Right;  . HERNIA REPAIR    . MOHS SURGERY     lip  . TOTAL KNEE ARTHROPLASTY Left 04/24/2016   Procedure: LEFT TOTAL KNEE ARTHROPLASTY;  Surgeon: Gaynelle Arabian, MD;  Location: [facility];  Service: Orthopedics;  Laterality: Left;  Adductor Block  . TOTAL KNEE ARTHROPLASTY Right 10/23/2016   Procedure: RIGHT TOTAL KNEE ARTHROPLASTY;  Surgeon: Gaynelle Arabian, MD;  Location: [facility];  Service: Orthopedics;  Laterality: Right;  . URETEROSCOPY WITH HOLMIUM LASER LITHOTRIPSY Right 09/07/2014   Procedure: URETEROSCOPY WITH HOLMIUM LASER LITHOTRIPSY;  Surgeon: Hollice Espy, MD;  Location: [facility];  Service: Urology;  Laterality: Right;  [name] VASECTOMY      Home Medications:  Allergies as of 12/24/2019      Reactions   Celebrex [celecoxib]    GI issues   Tetanus Toxoids Other (See Comments)   unkown   Tetanus-diphtheria Toxoids Td    Unknown      Medication List       Accurate as of December 24, 2019 10:04 AM. If you have any questions, ask your nurse or doctor.        Accu-Chek Aviva Plus test strip Generic drug: glucose blood Use as instructed   aspirin EC 81 MG tablet Take 81 mg by mouth daily. Swallow whole.   atorvastatin 40 MG tablet Commonly known as: LIPITOR Take 1 tablet (40 mg total) by mouth at bedtime.   DULoxetine 60 MG capsule Commonly known as: CYMBALTA TAKE 1 CAPSULE EVERY DAY   fluticasone 50 MCG/ACT nasal spray Commonly known as: FLONASE Place 2 sprays into both nostrils daily. 2 sprays What changed:   when to take this  reasons to take this  additional instructions   gabapentin 300 MG capsule Commonly known as: Neurontin Take 1 capsule (300 mg total) by mouth 3 (three) times daily. Emergency Supply while mail order comes in.   glipiZIDE 10 MG tablet Commonly known as: GLUCOTROL Take 1 tablet (10 mg total) by mouth daily before breakfast.   HYDROcodone-acetaminophen 10-325 MG tablet Commonly known as:  NORCO Take 1 tablet by mouth 3 (three) times daily.   hydrOXYzine 25 MG tablet Commonly known as: ATARAX/VISTARIL Take 25 mg by mouth 3 (three) times daily as needed for anxiety.   metFORMIN 1000 MG tablet Commonly known as: GLUCOPHAGE Take 1 tablet (1,000 mg total) by mouth 2 (two) times daily with a meal.   metoprolol succinate 50 MG 24 hr tablet Commonly known as: TOPROL-XL Take 1 tablet (50 mg total) by mouth daily. Take with or immediately following a meal. What changed: when to take this   multivitamin tablet Take 1 tablet by mouth daily.   spironolactone 25 MG tablet Commonly known as: ALDACTONE Take 2 tablets (50 mg total) by mouth daily.   tiZANidine 4 MG tablet Commonly known as: Zanaflex Take 1 tablet (4 mg total) by  mouth every 8 (eight) hours as needed for muscle spasms.   Trulicity 3.82 NK/5.3ZJ Sopn Generic drug: Dulaglutide Inject 0.75 mg into the skin every Wednesday.   Trulicity 1.5 QB/3.4LP Sopn Generic drug: Dulaglutide Inject 1.5 mg into the skin once a week.   valsartan-hydrochlorothiazide 320-12.5 MG tablet Commonly known as: DIOVAN-HCT TAKE 1 TABLET EVERY DAY       Allergies:  Allergies  Allergen Reactions  . Celebrex [Celecoxib]     GI issues  . Tetanus Toxoids Other (See Comments)    unkown  . Tetanus-Diphtheria Toxoids Td     Unknown    Family History: Family History  Problem Relation Age of Onset  . Hypertension Mother   . Cancer Mother   . Hyperlipidemia Mother   . Stroke Father   . Heart disease Father   . Depression Sister   . Hypertension Sister   . Hyperlipidemia Sister     Social History:  reports that he has never smoked. He has never used smokeless tobacco. He reports that he does not drink alcohol and does not use drugs.   Physical Exam: BP (!) 164/78   Pulse 88   Wt 267 lb (121.1 kg)   BMI 33.37 kg/m   Constitutional:  Alert and oriented, No acute distress. HEENT: [place] AT, moist mucus membranes.  Trachea  midline, no masses. Cardiovascular: No clubbing, cyanosis, or edema. Respiratory: Normal respiratory effort, no increased work of breathing. Skin: No rashes, bruises or suspicious lesions. Neurologic: Grossly intact, no focal deficits, moving all 4 extremities. Psychiatric: Normal mood and affect.  Laboratory Data:  Lab Results  Component Value Date   CREATININE 1.44 (H) 09/08/2019    Lab Results  Component Value Date   PSA 0.6 04/07/2019    Lab Results  Component Value Date   HGBA1C 8.9 (H) 09/04/2019     Pertinent Imaging: Results for orders placed or performed in visit on 12/24/19  BLADDER SCAN AMB NON-IMAGING  Result Value Ref Range   Scan Result 12 ML      Assessment & Plan:    1. BPH with outlet obstruction  Patient is doing well s/p Urolift. IPSS score: 3, mild. PVR 12 mL.  Discontinued Flomax 2 weeks ago.  2. Phimosis Foreskin is bothersome at times.  We had a discussion regarding circumcision, patient would like to hold off for now and pursue options later. He would like to focus on other health issues at this time.  We discussed the risk of the procedure at length including penile sensitivity, dissatisfaction with cosmesis, bleeding, infection amongst others. We discussed in the future, this can be done under local anesthetic in the office versus the operating room, operating room obvious preference. In the meantime, we had extensive discussion about foreskin hygiene.  RTC in 1 year for PVR/IPSS/PSA screening/DRE   [facility] [address] [place], [place] [address] [phone]  I, Selena Batten, am acting as a scribe for Dr. Hollice Espy.  I have reviewed the above documentation for accuracy and completeness, and I agree with the above.   Hollice Espy, MD  I spent 30 total minutes on the day of the encounter including pre-visit review of the medical record, face-to-face time with the patient, and  post visit ordering of labs/imaging/tests.

## 2019-12-24 ENCOUNTER — Ambulatory Visit (INDEPENDENT_AMBULATORY_CARE_PROVIDER_SITE_OTHER): Payer: Medicare PPO | Admitting: Urology

## 2019-12-24 ENCOUNTER — Other Ambulatory Visit: Payer: Self-pay

## 2019-12-24 VITALS — BP 164/78 | HR 88 | Wt 267.0 lb

## 2019-12-24 DIAGNOSIS — N138 Other obstructive and reflux uropathy: Secondary | ICD-10-CM

## 2019-12-24 DIAGNOSIS — N401 Enlarged prostate with lower urinary tract symptoms: Secondary | ICD-10-CM

## 2019-12-24 LAB — BLADDER SCAN AMB NON-IMAGING: Scan Result: 12

## 2019-12-25 ENCOUNTER — Telehealth: Payer: Self-pay

## 2019-12-29 NOTE — Progress Notes (Signed)
Patient assistance paperwork mailed to patient.  Patient will bring paperwork to office when complete.

## 2019-12-30 NOTE — Progress Notes (Signed)
.    Patient ID: Evan Rivas, male    DOB: Oct 26, 1950, 69 y.o.   MRN: [id number]  PCP: Towanda Malkin, MD  Chief Complaint  Patient presents with  . Follow-up  . Leg Pain    would like 847m ibuprofen for leg pain    Subjective:   Evan MADAYis a 69y.o. male, presents to clinic with CC of the following:  Chief Complaint  Patient presents with  . Follow-up  . Leg Pain    would like 8042mibuprofen for leg pain    HPI:  [name] is a [age].o. male Last visit was 12/03/2019 or leg pain  Follows up today as planned after his visit in June  He did follow-up with chronic care management-pharmacy on 10/02/2019 with recommendations to increase Trulicity to 1.5 mg weekly.  He noted last visit he had not started that increased dose, and was still waiting to get that medicine with the pharmacist help through the manufacturer.  (He noted it is a program through [employer] help due to the cost) On 12/22/2019, this was noted:  Performed chart review for medication changes. Filled out paperwork to apply for patient assistance program for Trulicity. Paperwork mailed to patient with [facility]  He had follow-up with urology who noted he was a candidate for UroLift, and he noted he would like to proceed with surgery.  This was done 11/24/2019.  He denies any concerns after that procedure and last f/u 12/24/2019 and noted continuing to do well after the urolift procedure  Leg pain  Exact source of his increased hip discomfort was unclear, as managed for trochanteric bursitis by his orthopedic doctor with an injection given, and he noted that was not helpful.  His pain did seem to involve the trochanteric bursa region, although extended towards the hip joint.  He noted the x-ray done by his orthopedist was unremarkable for increased arthritis issues.  It was recommended he  follow-up if his symptoms were not helped, and recommended he do that and he stated he can call the orthopedist to schedule that follow-up.  Would not increase the gabapentin any further presently, and can continue the tizanidine product in the short-term.  Is already on chronic opioids for pain management. Also recommended local measures to help, with cold best. Discussed may need further imaging, physical therapy to help over time and await further orthopedic input. Saw orthopedist and has MRI scheduled for Monday, and follow-up plan after the MRI is obtained. He notes it remains quite painful, and asked about having higher dose of ibuprofen or Tylenol potentially to help in the interim.  Felt best to use arthritis strength Tylenol product as needed in the short-term as felt safer than the higher dose ibuprofen presently  DM- He had seen endocrinology in the past for his diabetes, but with his A1c at goal range,returnedto the primary care provider for management.  Medication regimen-Trulicity-1.5 mg recommended after meeting with the pharmacist to help, but has not yet started,  metformin 2000 mg daily, and glipizide 10 mg daily. Signed forms for that today to help get the Trulicity He is also on an ARB and statin product.   Lab Results  Component Value Date   HGBA1C 8.9 (H) 09/04/2019   HGBA1C 7.3 (H) 04/07/2019   HGBA1C 8.1 (H) 06/28/2018   Lab Results  Component Value Date   MICROALBUR 10.9  04/07/2019   LDLCALC 78 06/28/2018   CREATININE 1.44 (H) 09/08/2019     Hyperlipidemia - heresumed atorvastatin after the 09/16/19 visit Noted to him the importance of this with his diabetes diagnosis, and the goal of keeping his LDL less than 70. Lab Results  Component Value Date   CHOL 147 06/28/2018   HDL 32 (L) 06/28/2018   LDLCALC 78 06/28/2018   TRIG 305 (H) 06/28/2018   CHOLHDL 4.6 06/28/2018     Morbid Obesity weight stable in the recent past, he noted he is trying to  lose weight presently and encouraged  Wt Readings from Last 3 Encounters:  12/31/19 268 lb 8 oz (121.8 kg)  12/24/19 267 lb (121.1 kg)  12/15/19 267 lb 9.6 oz (121.4 kg)      CKD stage G3b/A2, GFR 30-44 and albumin creatinine ratio 30-299 mg/g Previously, noted the importance of following kidney function over time, especially with the medications he is taking  Also emphasized the importance of keeping his blood sugars well controlled, also his blood pressure very well controlled, and avoiding chronic NSAIDs to help with the kidney function. He still sees nephrology as well to follow-up,and did not keep that follow-up appointment in August noted.  He noted he canceled due to Covid, and has not yet rescheduled as recommended Lab Results  Component Value Date   CREATININE 1.44 (H) 09/08/2019   BUN 27 (H) 09/08/2019   NA 138 09/08/2019   K 4.1 09/08/2019   CL 102 09/08/2019   CO2 28 09/08/2019     Hyperaldosteronism:He saw endocrinology lastinJuly, 2020 with the plan as noted; Plan * While he has hyperaldosteronism, this is felt not to be due to an adrenal adenoma. Rather likely has bilateral adrenal hyperplasia and this surgical intervention not warranted. Continue spironolactone. Encouraged follow up with Cardiology for hypertension mgmt with consideration of titrating spironolactone to ideal hypertension control. * Diabetes is well controlled. Continue current regimen. * Discussed weight loss. He is interested in weight loss and was encouraged to work on reducing portions and regularly exercising.  * He may follow up with me as needed.   Essential hypertension Med regimen -Toprol 50 mg daily, spironolactone 50 mg daily,valsartan-hydrochlorothiazide 320-12.5 daily Taking meds regularly BP Readings from Last 3 Encounters:  12/31/19 124/82  12/24/19 (!) 164/78  12/15/19 100/68     Continues to check blood pressures at home to monitor, running about the same as got  today Goal blood pressure less than 130/80. Continues to see nephrology (last visit 05/2019) and also gets cardiology input.  Chronic Pain Managementhx -DDD/chronic back pain:He has never had any spinal surgeries; Dr. Dot Been management)did not do any procedures with [name] as Mr. Craigie declined.Dr. [name] requested that the patient's narcotic pain medication Rx be transferred to our clinic to help with financial burden of specialty visits for the patient, therefore Mr. Deery has been receiving his pain management medication fromour clinic instead.Per last note by [name], at this time, we will maintain Rx's with our clinic, hadcontrolled substance contract renewallast visit in March, 2021.I noted we will need to do another contract today with me as the provider if I am going to prescribe this,and he was okay with that. -Medications: TakingTizanidine,Norco - Taking 10-346m TID, Gabapentin 3088mTID.HasNarcan auto-injector at home and knows how to use it. His chronic pain was noted to be about 4/10 across his lumbar spine, occasionally goes down to right outer hip.He also has some bilaterally knee pain - s/p bilateral knee  replacement for OA in 2018 Controlled Substance Database checked and no suspicious findings.Pt has been consistently very compliant.   He continues to be followed by nephrology and cardiology. His last visit with cardiology was 09/22/19 with the following A/P noted: 1. Secondary hypertension due to hyperaldosteronism -BP at home routinely 130s/80s.Due to occasional hypotensive reading at home we will defer additional changes to his antihypertensive regimen.Goal blood pressure less than 130/80.Continue Toprol 50 mg daily, spironolactone 50 mg daily,valsartan-hydrochlorothiazide 320-12.5 daily.If additional control needed in the future could consider uptitrating his Toprol versus transition to Coreg.  2. Leg edema  -Euvolemic on exam. Continue spironolactone 50 mg daily. He has a follow-up with cardiology inthe fall(October) and a follow-up with nephrology in August which was not completed, and not yet rescheduled     Patient Active Problem List   Diagnosis Date Noted  . Controlled substance agreement signed 09/30/2019  . Acute renal failure with acute renal cortical necrosis superimposed on stage 3a chronic kidney disease ([place]) 09/07/2019  . Sepsis ([place]) 09/05/2019  . Emphysematous pyelitis 09/04/2019  . Pyelonephritis 09/04/2019  . CKD stage G3b/A2, GFR 30-44 and albumin creatinine ratio 30-299 mg/g 04/09/2019  . Hyperaldosteronism ([place]) 06/24/2018  . Diastolic dysfunction 80/99/8338  . OSA (obstructive sleep apnea) 06/24/2018  . Excessive daytime sleepiness 06/24/2018  . Aortic atherosclerosis ([place]) 06/17/2018  . Leg edema 12/18/2017  . BPH without urinary obstruction 06/03/2015  . Depression 03/05/2015  . Hyperlipidemia 09/03/2014  . Major depressive disorder, recurrent episode, moderate ([place]) 09/03/2014  . Chronic low back pain 09/03/2014  . Arthralgia of both knees 09/03/2014  . Type 2 diabetes mellitus without complication ([place]) 25/08/3974  . Allergic rhinitis 07/04/2014  . Anxiety 07/04/2014  . Continuous opioid dependence ([place]) 07/04/2014  . Diabetes mellitus type 2 in obese ([place]) 07/04/2014  . DDD (degenerative disc disease), lumbar 07/04/2014  . Gout 07/04/2014  . Essential hypertension 07/04/2014  . Flu vaccine need 07/04/2014  . Morbid obesity ([place]) 07/04/2014  . OA (osteoarthritis) of knee 07/04/2014  . Neuralgia neuritis, sciatic nerve 07/04/2014  . Arthralgia of multiple joints 07/04/2014  . H/O malignant neoplasm of skin 12/24/2013      Current Outpatient Medications:  .  aspirin EC 81 MG tablet, Take 81 mg by mouth daily. Swallow whole., Disp: , Rfl:  .  atorvastatin (LIPITOR) 40 MG tablet, Take 1 tablet (40 mg total) by mouth at bedtime., Disp: 90 tablet,  Rfl: 3 .  Dulaglutide (TRULICITY) 7.34 LP/3.7TK SOPN, Inject 0.75 mg into the skin every Wednesday., Disp: , Rfl:  .  Dulaglutide (TRULICITY) 1.5 WI/0.9BD SOPN, Inject 1.5 mg into the skin once a week., Disp: 6 mL, Rfl: 3 .  DULoxetine (CYMBALTA) 60 MG capsule, TAKE 1 CAPSULE EVERY DAY, Disp: 90 capsule, Rfl: 0 .  fluticasone (FLONASE) 50 MCG/ACT nasal spray, Place 2 sprays into both nostrils daily. 2 sprays (Patient taking differently: Place 2 sprays into both nostrils daily as needed for allergies. ), Disp: 16 g, Rfl: 6 .  glipiZIDE (GLUCOTROL) 10 MG tablet, Take 1 tablet (10 mg total) by mouth daily before breakfast., Disp: 90 tablet, Rfl: 1 .  glucose blood (ACCU-CHEK AVIVA PLUS) test strip, Use as instructed, Disp: 100 each, Rfl: 12 .  hydrOXYzine (ATARAX/VISTARIL) 25 MG tablet, Take 25 mg by mouth 3 (three) times daily as needed for anxiety. , Disp: , Rfl:  .  metFORMIN (GLUCOPHAGE) 1000 MG tablet, Take 1 tablet (1,000 mg total) by mouth 2 (two) times daily with a meal., Disp:  180 tablet, Rfl: 3 .  metoprolol succinate (TOPROL-XL) 50 MG 24 hr tablet, Take 1 tablet (50 mg total) by mouth daily. Take with or immediately following a meal. (Patient taking differently: Take 50 mg by mouth at bedtime. Take with or immediately following a meal.), Disp: 90 tablet, Rfl: 3 .  Multiple Vitamin (MULTIVITAMIN) tablet, Take 1 tablet by mouth daily., Disp: , Rfl:  .  spironolactone (ALDACTONE) 25 MG tablet, Take 2 tablets (50 mg total) by mouth daily., Disp: 180 tablet, Rfl: 3 .  tiZANidine (ZANAFLEX) 4 MG tablet, Take 1 tablet (4 mg total) by mouth every 8 (eight) hours as needed for muscle spasms., Disp: 60 tablet, Rfl: 3 .  valsartan-hydrochlorothiazide (DIOVAN-HCT) 320-12.5 MG tablet, TAKE 1 TABLET EVERY DAY, Disp: 90 tablet, Rfl: 0 .  blood glucose meter kit and supplies, Dispense based on patient and insurance preference. Use up to four times daily as directed. (FOR ICD-10 E10.9, E11.9)., Disp: 1 each,  Rfl: 0 .  gabapentin (NEURONTIN) 300 MG capsule, Take 1 capsule (300 mg total) by mouth 3 (three) times daily. Emergency Supply while mail order comes in., Disp: 270 capsule, Rfl: 3   Allergies  Allergen Reactions  . Celebrex [Celecoxib]     GI issues  . Tetanus Toxoids Other (See Comments)    unkown  . Tetanus-Diphtheria Toxoids Td     Unknown     Past Surgical History:  Procedure Laterality Date  . ARTHROSCOPIC REPAIR ACL Left   . CYSTOSCOPY W/ URETERAL STENT PLACEMENT Right 09/07/2014   Procedure: CYSTOSCOPY WITH STENT REPLACEMENT;  Surgeon: Hollice Espy, MD;  Location: [facility];  Service: Urology;  Laterality: Right;  . CYSTOSCOPY WITH INSERTION OF UROLIFT N/A 11/24/2019   Procedure: CYSTOSCOPY WITH INSERTION OF UROLIFT;  Surgeon: Hollice Espy, MD;  Location: [facility];  Service: Urology;  Laterality: N/A;  . CYSTOSCOPY/URETEROSCOPY/HOLMIUM LASER/STENT PLACEMENT Right 08/17/2014   Procedure: CYSTOSCOPY/URETEROSCOPY//STENT PLACEMENT/ attempt of lithotripsy;  Surgeon: Hollice Espy, MD;  Location: [facility];  Service: Urology;  Laterality: Right;  . HERNIA REPAIR    . MOHS SURGERY     lip  . TOTAL KNEE ARTHROPLASTY Left 04/24/2016   Procedure: LEFT TOTAL KNEE ARTHROPLASTY;  Surgeon: Gaynelle Arabian, MD;  Location: [facility];  Service: Orthopedics;  Laterality: Left;  Adductor Block  . TOTAL KNEE ARTHROPLASTY Right 10/23/2016   Procedure: RIGHT TOTAL KNEE ARTHROPLASTY;  Surgeon: Gaynelle Arabian, MD;  Location: [facility];  Service: Orthopedics;  Laterality: Right;  . URETEROSCOPY WITH HOLMIUM LASER LITHOTRIPSY Right 09/07/2014   Procedure: URETEROSCOPY WITH HOLMIUM LASER LITHOTRIPSY;  Surgeon: Hollice Espy, MD;  Location: [facility];  Service: Urology;  Laterality: Right;  [name] VASECTOMY       Family History  Problem Relation Age of Onset  . Hypertension Mother   . Cancer Mother   . Hyperlipidemia Mother   . Stroke Father   . Heart disease Father   . Depression Sister   . Hypertension  Sister   . Hyperlipidemia Sister      Social History   Tobacco Use  . Smoking status: Never Smoker  . Smokeless tobacco: Never Used  Substance Use Topics  . Alcohol use: No    With staff assistance, above reviewed with the patient today.  ROS: As per HPI, otherwise no specific complaints on a limited and focused system review   No results found for this or any previous visit (from the past 72 hour(s)).   PHQ2/9: Depression screen [facility] 2/9 12/31/2019 12/15/2019 09/30/2019 09/29/2019  09/16/2019  Decreased Interest 0 0 0 0 0  Down, Depressed, Hopeless 0 0 0 0 0  PHQ - 2 Score 0 0 0 0 0  Altered sleeping - - 0 0 0  Tired, decreased energy - - 0 0 2  Change in appetite - - 0 0 0  Feeling bad or failure about yourself  - - 0 0 0  Trouble concentrating - - 0 0 0  Moving slowly or fidgety/restless - - 0 0 0  Suicidal thoughts - - 0 0 0  PHQ-9 Score - - 0 0 2  Difficult doing work/chores - - Not difficult at all - Not difficult at all  Some recent data might be hidden   PHQ-2/9 Result is neg  Fall Risk: Fall Risk  12/31/2019 12/15/2019 09/30/2019 09/29/2019 09/16/2019  Falls in the past year? 0 0 0 0 0  Number falls in past yr: 0 0 0 0 0  Injury with Fall? 0 0 0 0 0  Risk for fall due to : - - - - -  Follow up Falls evaluation completed Falls evaluation completed - - -      Objective:   Vitals:   12/31/19 0942  BP: 124/82  Pulse: (!) 128  Resp: 20  Temp: 98.1 F (36.7 C)  TempSrc: Oral  SpO2: 97%  Weight: 268 lb 8 oz (121.8 kg)  Height: '6\' 3"'  (1.905 m)    Body mass index is 33.56 kg/m.  Physical Exam   NAD, masked HEENT - [place]/AT, sclera anicteric,  Neck - supple, no adenopathy, no TM, carotids 2+ and = without bruits bilat Car -borderline tachy and regular, pulse approximately 100 on my exam Pulm -clear to auscultation Abd - soft, obese, nontender diffusely Back - no  CVA tenderness Ext -trace lower extremity edema, with some mild indentation at the sock line  noted bilaterally, not marked  Neuro/psychiatric - affect was not flat, appropriate with conversation             Alert            Grossly nonfocal with sensation intact to light touch in the distal extremities, and strength adequate in the extremities including with grip strength,             Speech normal   Results for orders placed or performed in visit on 12/24/19  BLADDER SCAN AMB NON-IMAGING  Result Value Ref Range   Scan Result 12 ML    Last labs reviewed    Assessment & Plan:    1. Diabetes mellitus type 2 in obese [facility]) Discussed the importance of getting better control of his blood sugars, and starting the Trulicity is an important component to that. Completed the paperwork today and I signed that paperwork to hopefully get the Trulicity very soon and start that as prescribed. We will recheck labs today, and I do expect his hemoglobin A1c to still be elevated, and then can hopefully assess the benefits of Trulicity when that is started. Continue the Metformin and sulfonylurea presently - COMPLETE METABOLIC PANEL WITH GFR - TSH - Hemoglobin A1c  2. Mixed hyperlipidemia Continue the statin product, and emphasized today the importance of continuing this medication, especially with his diabetes. We will recheck a lipid panel today  - COMPLETE METABOLIC PANEL WITH GFR - TSH - Lipid panel  3. Morbid obesity ([place]) The importance of a healthy weight maintenance was discussed today, and he states he is trying to lose weight presently and  strongly encouraged that. - TSH  4. CKD stage G3b/A2, GFR 30-44 and albumin creatinine ratio 30-299 mg/g ([facility]) Felt best not to add higher doses of NSAIDs presently like ibuprofen to add in addition to his chronic pain management with opioids, and can try an arthritis strength Tylenol product as needed in the short-term. We will check labs today. Recommended to follow-up with nephrology, and he needs to reschedule that. - COMPLETE METABOLIC  PANEL WITH GFR  5. Essential hypertension Blood pressure remains controlled presently Continue his current regimen. Also continue with nephrology and audiology input to help. - COMPLETE METABOLIC PANEL WITH GFR  6. Hip pain, right Continue with orthopedic follow-up, with an MRI ordered for Monday as part of that work-up  7. Other chronic pain 8. Continuous opioid dependence ([place]) 9. Controlled substance agreement signed Refill of the Norco product today, and he does have a contract and has been very compliant with this. He also requested a refill of the Neurontin product, which was also provided. - HYDROcodone-acetaminophen (NORCO) 10-325 MG tablet; Take 1 tablet by mouth 3 (three) times daily.  Dispense: 90 tablet; Refill: 0 - gabapentin (NEURONTIN) 300 MG capsule; Take 1 capsule (300 mg total) by mouth 3 (three) times daily. Emergency Supply while mail order comes in.  Dispense: 270 capsule; Refill: 3  10. Bilateral lower extremity edema Has remained controlled in the recent past with his current regimen. Continue to have cardiology input as well noted   11. DDD (degenerative disc disease), lumbar Is the reason for his chronic pain management need  - tiZANidine (ZANAFLEX) 4 MG tablet; Take 1 tablet (4 mg total) by mouth every 8 (eight) hours as needed for muscle spasms.  Dispense: 60 tablet; Refill: 3 - HYDROcodone-acetaminophen (NORCO) 10-325 MG tablet; Take 1 tablet by mouth 3 (three) times daily.  Dispense: 90 tablet; Refill: 0 - gabapentin (NEURONTIN) 300 MG capsule; Take 1 capsule (300 mg total) by mouth 3 (three) times daily. Emergency Supply while mail order comes in.  Dispense: 270 capsule; Refill: 3   Await lab results, and hopefully then the initiation of Trulicity to help with better blood pressure control.  He will continue to follow with orthopedics, with an MRI planned Monday, and tentatively scheduled to follow-up again in 3 months time, sooner as  needed.     Towanda Malkin, MD 12/31/19 10:04 AM

## 2019-12-31 ENCOUNTER — Encounter: Payer: Self-pay | Admitting: Internal Medicine

## 2019-12-31 ENCOUNTER — Other Ambulatory Visit: Payer: Self-pay

## 2019-12-31 ENCOUNTER — Ambulatory Visit: Payer: Medicare PPO | Admitting: Internal Medicine

## 2019-12-31 VITALS — BP 124/82 | HR 128 | Temp 98.1°F | Resp 20 | Ht 75.0 in | Wt 268.5 lb

## 2019-12-31 DIAGNOSIS — E782 Mixed hyperlipidemia: Secondary | ICD-10-CM | POA: Diagnosis not present

## 2019-12-31 DIAGNOSIS — G8929 Other chronic pain: Secondary | ICD-10-CM

## 2019-12-31 DIAGNOSIS — N1832 Chronic kidney disease, stage 3b: Secondary | ICD-10-CM

## 2019-12-31 DIAGNOSIS — I10 Essential (primary) hypertension: Secondary | ICD-10-CM

## 2019-12-31 DIAGNOSIS — N400 Enlarged prostate without lower urinary tract symptoms: Secondary | ICD-10-CM

## 2019-12-31 DIAGNOSIS — M5136 Other intervertebral disc degeneration, lumbar region: Secondary | ICD-10-CM

## 2019-12-31 DIAGNOSIS — Z79899 Other long term (current) drug therapy: Secondary | ICD-10-CM

## 2019-12-31 DIAGNOSIS — E1169 Type 2 diabetes mellitus with other specified complication: Secondary | ICD-10-CM | POA: Diagnosis not present

## 2019-12-31 DIAGNOSIS — M25551 Pain in right hip: Secondary | ICD-10-CM | POA: Insufficient documentation

## 2019-12-31 DIAGNOSIS — R600 Localized edema: Secondary | ICD-10-CM

## 2019-12-31 DIAGNOSIS — E669 Obesity, unspecified: Secondary | ICD-10-CM

## 2019-12-31 DIAGNOSIS — F1120 Opioid dependence, uncomplicated: Secondary | ICD-10-CM

## 2019-12-31 MED ORDER — GABAPENTIN 300 MG PO CAPS: 300.0000 mg | ORAL_CAPSULE | Freq: Three times a day (TID) | ORAL | 3 refills | Status: DC

## 2019-12-31 MED ORDER — HYDROCODONE-ACETAMINOPHEN 10-325 MG PO TABS: 1.0000 | ORAL_TABLET | Freq: Three times a day (TID) | ORAL | 0 refills | Status: AC

## 2019-12-31 MED ORDER — HYDROCODONE-ACETAMINOPHEN 10-325 MG PO TABS
1.0000 | ORAL_TABLET | Freq: Three times a day (TID) | ORAL | 0 refills | Status: AC
Start: 2019-12-31 — End: 2020-01-30

## 2019-12-31 MED ORDER — HYDROCODONE-ACETAMINOPHEN 10-325 MG PO TABS: 1.0000 | ORAL_TABLET | Freq: Three times a day (TID) | ORAL | 0 refills | Status: DC

## 2019-12-31 MED ORDER — TIZANIDINE HCL 4 MG PO TABS: 4.0000 mg | ORAL_TABLET | Freq: Three times a day (TID) | ORAL | 3 refills | Status: DC | PRN

## 2019-12-31 MED ORDER — BLOOD GLUCOSE METER KIT: PACK | 0 refills | Status: DC

## 2020-01-01 LAB — COMPLETE METABOLIC PANEL WITH GFR
AG Ratio: 1.7 (calc) (ref 1.0–2.5)
ALT: 24 U/L (ref 9–46)
AST: 14 U/L (ref 10–35)
Albumin: 4.3 g/dL (ref 3.6–5.1)
Alkaline phosphatase (APISO): 71 U/L (ref 35–144)
BUN/Creatinine Ratio: 23 (calc) — ABNORMAL HIGH (ref 6–22)
BUN: 46 mg/dL — ABNORMAL HIGH (ref 7–25)
CO2: 22 mmol/L (ref 20–32)
Calcium: 10 mg/dL (ref 8.6–10.3)
Chloride: 106 mmol/L (ref 98–110)
Creat: 1.96 mg/dL — ABNORMAL HIGH (ref 0.70–1.25)
GFR, Est African American: 39 mL/min/{1.73_m2} — ABNORMAL LOW (ref >=60)
GFR, Est Non African American: 34 mL/min/{1.73_m2} — ABNORMAL LOW (ref >=60)
Globulin: 2.6 g/dL (calc) (ref 1.9–3.7)
Glucose, Bld: 265 mg/dL — ABNORMAL HIGH (ref 65–99)
Potassium: 5.7 mmol/L — ABNORMAL HIGH (ref 3.5–5.3)
Sodium: 140 mmol/L (ref 135–146)
Total Bilirubin: 0.3 mg/dL (ref 0.2–1.2)
Total Protein: 6.9 g/dL (ref 6.1–8.1)

## 2020-01-01 LAB — LIPID PANEL
Cholesterol: 156 mg/dL (ref <200)
HDL: 34 mg/dL — ABNORMAL LOW (ref >=40)
LDL Cholesterol (Calc): 82 mg/dL (calc)
Non-HDL Cholesterol (Calc): 122 mg/dL (calc) (ref <130)
Total CHOL/HDL Ratio: 4.6 (calc) (ref <5.0)
Triglycerides: 336 mg/dL — ABNORMAL HIGH (ref <150)

## 2020-01-01 LAB — HEMOGLOBIN A1C
Hgb A1c MFr Bld: 9.2 % of total Hgb — ABNORMAL HIGH (ref <5.7)
Mean Plasma Glucose: 217 (calc)
eAG (mmol/L): 12 (calc)

## 2020-01-01 LAB — TSH: TSH: 3.98 mIU/L (ref 0.40–4.50)

## 2020-01-05 ENCOUNTER — Telehealth: Payer: Self-pay

## 2020-01-05 DIAGNOSIS — M25551 Pain in right hip: Secondary | ICD-10-CM | POA: Diagnosis not present

## 2020-01-05 NOTE — Progress Notes (Signed)
Patient contacted to change upcoming appointment to telephone. Patient is agreeable to telephone. He does express concern that he sees Dr Roxan Hockey so close to [facility] appointment's.

## 2020-01-07 DIAGNOSIS — N200 Calculus of kidney: Secondary | ICD-10-CM | POA: Diagnosis not present

## 2020-01-07 DIAGNOSIS — I10 Essential (primary) hypertension: Secondary | ICD-10-CM | POA: Diagnosis not present

## 2020-01-07 DIAGNOSIS — N179 Acute kidney failure, unspecified: Secondary | ICD-10-CM | POA: Diagnosis not present

## 2020-01-07 DIAGNOSIS — E875 Hyperkalemia: Secondary | ICD-10-CM | POA: Diagnosis not present

## 2020-01-07 DIAGNOSIS — E1122 Type 2 diabetes mellitus with diabetic chronic kidney disease: Secondary | ICD-10-CM | POA: Diagnosis not present

## 2020-01-07 DIAGNOSIS — N1832 Chronic kidney disease, stage 3b: Secondary | ICD-10-CM | POA: Diagnosis not present

## 2020-01-07 DIAGNOSIS — M1611 Unilateral primary osteoarthritis, right hip: Secondary | ICD-10-CM | POA: Insufficient documentation

## 2020-01-08 ENCOUNTER — Other Ambulatory Visit: Payer: Self-pay

## 2020-01-08 ENCOUNTER — Ambulatory Visit: Payer: Self-pay | Admitting: Pharmacist

## 2020-01-08 DIAGNOSIS — E119 Type 2 diabetes mellitus without complications: Secondary | ICD-10-CM

## 2020-01-08 DIAGNOSIS — E7800 Pure hypercholesterolemia, unspecified: Secondary | ICD-10-CM

## 2020-01-08 NOTE — Chronic Care Management (AMB) (Signed)
Chronic Care Management Pharmacy  Name: Evan Rivas  MRN: [id number] DOB: 1950/05/10  Chief Complaint/ HPI  Evan Rivas,  69 y.o. , male presents for their Follow-Up CCM visit with the clinical pharmacist via telephone due to COVID-19 Pandemic.  PCP : Evan Malkin, MD  Their chronic conditions include: HTN, HLD, DM  Office Visits: 9/29 DM, Evan Rivas, BP 124/82 P 128 Wt 268.5 BMI 33.6, hip pain, don't increase gabapentin, using Zanaflex, no ibuprofen CKD3, use APAP 654m prn,   Consult Visit: 9/22 BPH, Evan Rivas, BP 164/78 P 88 Wt 267 BMI 33.4, s/p Urolift, phimosis considering circumcision, stopped Flomax  Medications: Outpatient Encounter Medications as of 01/08/2020  Medication Sig Note  . aspirin EC 81 MG tablet Take 81 mg by mouth daily. Swallow whole.   .Marland Kitchenatorvastatin (LIPITOR) 40 MG tablet Take 1 tablet (40 mg total) by mouth at bedtime.   . blood glucose meter kit and supplies Dispense based on patient and insurance preference. Use up to four times daily as directed. (FOR ICD-10 E10.9, E11.9).   . Dulaglutide (TRULICITY) 02.50MIB/7.0WUSOPN Inject 0.75 mg into the skin every Wednesday. 10/31/2019: Pt still taking 0.75, will move up to 1.5 dose after pt runs out of the 0.75   . Dulaglutide (TRULICITY) 1.5 MGQ/9.1QXSOPN Inject 1.5 mg into the skin once a week. 10/31/2019: Pt still taking 0.75, will move up to 1.5 dose after pt runs out of the 0.75   . DULoxetine (CYMBALTA) 60 MG capsule TAKE 1 CAPSULE EVERY DAY   . fluticasone (FLONASE) 50 MCG/ACT nasal spray Place 2 sprays into both nostrils daily. 2 sprays (Patient taking differently: Place 2 sprays into both nostrils daily as needed for allergies. )   . gabapentin (NEURONTIN) 300 MG capsule Take 1 capsule (300 mg total) by mouth 3 (three) times daily. Emergency Supply while mail order comes in.   .Marland KitchenglipiZIDE (GLUCOTROL) 10 MG tablet Take 1 tablet (10 mg total) by mouth daily before breakfast.   . glucose  blood (ACCU-CHEK AVIVA PLUS) test strip Use as instructed   . HYDROcodone-acetaminophen (NORCO) 10-325 MG tablet Take 1 tablet by mouth 3 (three) times daily.   .Derrill MemoON 01/31/2020] HYDROcodone-acetaminophen (NORCO) 10-325 MG tablet Take 1 tablet by mouth 3 (three) times daily.   .Derrill MemoON 03/02/2020] HYDROcodone-acetaminophen (NORCO) 10-325 MG tablet Take 1 tablet by mouth 3 (three) times daily.   . hydrOXYzine (ATARAX/VISTARIL) 25 MG tablet Take 25 mg by mouth 3 (three) times daily as needed for anxiety.    . metFORMIN (GLUCOPHAGE) 1000 MG tablet Take 1 tablet (1,000 mg total) by mouth 2 (two) times daily with a meal.   . metoprolol succinate (TOPROL-XL) 50 MG 24 hr tablet Take 1 tablet (50 mg total) by mouth daily. Take with or immediately following a meal. (Patient taking differently: Take 50 mg by mouth at bedtime. Take with or immediately following a meal.)   . Multiple Vitamin (MULTIVITAMIN) tablet Take 1 tablet by mouth daily.   .Marland Kitchenspironolactone (ALDACTONE) 25 MG tablet Take 2 tablets (50 mg total) by mouth daily.   .Marland KitchentiZANidine (ZANAFLEX) 4 MG tablet Take 1 tablet (4 mg total) by mouth every 8 (eight) hours as needed for muscle spasms.   . valsartan-hydrochlorothiazide (DIOVAN-HCT) 320-12.5 MG tablet TAKE 1 TABLET EVERY DAY    No facility-administered encounter medications on file as of 01/08/2020.     Financial Resource Strain: High Risk  . Difficulty of Paying Living Expenses: Hard  Current Diagnosis/Assessment:  Goals Addressed            This Visit's Progress   . Chronic Care Management       CARE PLAN ENTRY (see longitudinal plan of care for additional care plan information)  Current Barriers:  . Chronic Disease Management support, education, and care coordination needs related to Hypertension, Hyperlipidemia, Diabetes, and BPH   Hypertension BP Readings from Last 3 Encounters:  09/30/19 138/64  09/29/19 118/70  09/23/19 139/86   . Pharmacist Clinical  Goal(s): o Over the next 90 days, patient will work with PharmD and providers to maintain BP goal <130/80 . Current regimen:  o Diovan-HCT 320-31m daily o Toprol-XL 536mdaily with food o Spironolactone 5057maily . Interventions: o None . Patient self care activities - Over the next 90 days, patient will: o Check BP weekly, document, and provide at future appointments o Ensure daily salt intake < 2300 mg/day  Hyperlipidemia Lab Results  Component Value Date/Time   LDLCALC 78 06/28/2018 10:19 AM   . Pharmacist Clinical Goal(s): o Over the next 90 days, patient will work with PharmD and providers to achieve LDL goal < 70 . Current regimen:  o Lipitor 29m42mily . Interventions: o None . Patient self care activities - Over the next 90 days, patient will: o Take atorvastatin 29mg20mly as directed  Diabetes Lab Results  Component Value Date/Time   HGBA1C 8.9 (H) 09/04/2019 07:12 PM   HGBA1C 7.3 (H) 04/07/2019 10:58 AM   HGBA1C 8.9 03/25/2018 10:00 AM   HGBA1C 8.9 (A) 03/25/2018 10:00 AM   HGBA1C 8.9 (A) 03/25/2018 10:00 AM   HGBA1C 8.0 (A) 12/18/2017 08:57 AM   . Pharmacist Clinical Goal(s): o Over the next 90 days, patient will work with PharmD and providers to achieve A1c goal <7% . Current regimen:  o Metformin 1000mg 30me daily o Trulicity 0.75mg8.88BVt weekly o Glipizide 10mg d56m . Interventions: o New PAP order to [facility] to increase Trulicity 1.5mg d6.9IH. Patient self care activities - Over the next 90 days, patient will: o Check blood sugar once daily, document, and provide at future appointments o Contact provider with any episodes of hypoglycemia  BPH . Pharmacist Clinical Goal(s) o Over the next 90 days, patient will work with PharmD and providers to improve bladder control . Current regimen:  o Tamsulosin 0.4mg dai32m. Interventions: o None . Patient self care activities - Over the next 90 days, patient will: o Work with provider and PharmD to  achieve optimal bladder control  Medication management . Pharmacist Clinical Goal(s): o Over the next 90 days, patient will work with PharmD and providers to maintain optimal medication adherence . Current pharmacy: [facility] . Interventions o Comprehensive medication review performed. o Continue current medication management strategy . Patient self care activities - Over the next 90 days, patient will: o Focus on medication adherence by ensuring [facility] sends increased dose of Trulicity o Take medications as prescribed o Report any questions or concerns to PharmD and/or provider(s)  Initial goal documentation       Diabetes   Recent Relevant Labs: Lab Results  Component Value Date/Time   HGBA1C 9.2 (H) 12/31/2019 10:30 AM   HGBA1C 8.9 (H) 09/04/2019 07:12 PM   HGBA1C 8.9 03/25/2018 10:00 AM   HGBA1C 8.9 (A) 03/25/2018 10:00 AM   HGBA1C 8.9 (A) 03/25/2018 10:00 AM   HGBA1C 8.0 (A) 12/18/2017 08:57 AM   MICROALBUR 10.9 04/07/2019 10:58 AM   MICROALBUR 1.2 07/30/2018  09:21 AM     Checking BG: Rarely  Patient has failed these meds in past: NA Patient is currently uncontrolled on the following medications: glipizide, metformin, Trulicity  Last diabetic Foot exam: No results found for: HMDIABEYEEXA  Last diabetic Eye exam: No results found for: HMDIABFOOTEX   We discussed: Completed new PAP from scratch for Trulicity Will increase dose soon.  Plan  Continue current medications  Hyperlipidemia   LDL goal < 70  Lipid Panel     Component Value Date/Time   CHOL 156 12/31/2019 1030   CHOL 140 09/17/2017 1014   TRIG 336 (H) 12/31/2019 1030   HDL 34 (L) 12/31/2019 1030   HDL 32 (L) 09/17/2017 1014   LDLCALC 82 12/31/2019 1030    Hepatic Function Latest Ref Rng & Units 12/31/2019 04/07/2019 09/17/2017  Total Protein 6.1 - 8.1 g/dL 6.9 6.6 6.6  Albumin 3.6 - 4.8 g/dL - - 4.0  AST 10 - 35 U/L '14 14 13  ' ALT 9 - 46 U/L '24 24 16  ' Alk Phosphatase 39 - 117 IU/L - - 84  Total  Bilirubin 0.2 - 1.2 mg/dL 0.3 0.3 0.3     The 10-year ASCVD risk score [name], et al., 2013) is: 38.1%   Values used to calculate the score:     Age: 71 years     Sex: Male     Is Non-Hispanic African American: No     Diabetic: Yes     Tobacco smoker: No     Systolic Blood Pressure: 481 mmHg     Is BP treated: Yes     HDL Cholesterol: 34 mg/dL     Total Cholesterol: 156 mg/dL   Patient has failed these meds in past: NA Patient is currently uncontrolled on the following medications:  . Lipitor 49m daily  We discussed: Not at goal Wants to improve diet and exercise  Plan  Continue current medications  Medication Management   We discussed:  APAP [id number] Couldn't find OA with X-ray Trouble with diet  Plan  Continue current medication management strategy  Follow up: 3 month phone visit  TeMilus HeightPharmD, BCRomilda GarretCTBlanchester Medical [phone]

## 2020-01-13 NOTE — Patient Instructions (Addendum)
Visit Information  Goals Addressed            This Visit's Progress   . Chronic Care Management       CARE PLAN ENTRY (see longitudinal plan of care for additional care plan information)  Current Barriers:  . Chronic Disease Management support, education, and care coordination needs related to Hypertension, Hyperlipidemia, Diabetes, and BPH   Hypertension BP Readings from Last 3 Encounters:  09/30/19 138/64  09/29/19 118/70  09/23/19 139/86   . Pharmacist Clinical Goal(s): o Over the next 90 days, patient will work with PharmD and providers to maintain BP goal <130/80 . Current regimen:  o Diovan-HCT 320-25mg  daily o Toprol-XL 50mg  daily with food o Spironolactone 50mg  daily . Interventions: o None . Patient self care activities - Over the next 90 days, patient will: o Check BP weekly, document, and provide at future appointments o Ensure daily salt intake < 2300 mg/day  Hyperlipidemia Lab Results  Component Value Date/Time   LDLCALC 78 06/28/2018 10:19 AM   . Pharmacist Clinical Goal(s): o Over the next 90 days, patient will work with PharmD and providers to achieve LDL goal < 70 . Current regimen:  o Lipitor 40mg  daily . Interventions: o None . Patient self care activities - Over the next 90 days, patient will: o Take atorvastatin 40mg  daily as directed  Diabetes Lab Results  Component Value Date/Time   HGBA1C 8.9 (H) 09/04/2019 07:12 PM   HGBA1C 7.3 (H) 04/07/2019 10:58 AM   HGBA1C 8.9 03/25/2018 10:00 AM   HGBA1C 8.9 (A) 03/25/2018 10:00 AM   HGBA1C 8.9 (A) 03/25/2018 10:00 AM   HGBA1C 8.0 (A) 12/18/2017 08:57 AM   . Pharmacist Clinical Goal(s): o Over the next 90 days, patient will work with PharmD and providers to achieve A1c goal <7% . Current regimen:  o Metformin 1000mg  twice daily o Trulicity 0.75mg  inject weekly o Glipizide 10mg  daily . Interventions: o New PAP order to [facility] to increase Trulicity 1.5mg  daily . Patient self care activities -  Over the next 90 days, patient will: o Check blood sugar once daily, document, and provide at future appointments o Contact provider with any episodes of hypoglycemia  BPH . Pharmacist Clinical Goal(s) o Over the next 90 days, patient will work with PharmD and providers to improve bladder control . Current regimen:  o Tamsulosin 0.4mg  daily . Interventions: o None . Patient self care activities - Over the next 90 days, patient will: o Work with provider and PharmD to achieve optimal bladder control  Medication management . Pharmacist Clinical Goal(s): o Over the next 90 days, patient will work with PharmD and providers to maintain optimal medication adherence . Current pharmacy: [facility] . Interventions o Comprehensive medication review performed. o Continue current medication management strategy . Patient self care activities - Over the next 90 days, patient will: o Focus on medication adherence by ensuring [facility] sends increased dose of Trulicity o Take medications as prescribed o Report any questions or concerns to PharmD and/or provider(s)  Initial goal documentation        Print copy of patient instructions provided.   Telephone follow up appointment with pharmacy team member scheduled for: 3 months  Milus Height, PharmD, [place], [facility] [phone]  Diabetes Mellitus and Nutrition, Adult When you have diabetes (diabetes mellitus), it is very important to have healthy eating habits because your blood sugar (glucose) levels are greatly affected by what you eat and drink. Eating healthy foods  in the appropriate amounts, at about the same times every day, can help you:  Control your blood glucose.  Lower your risk of heart disease.  Improve your blood pressure.  Reach or maintain a healthy weight. Every person with diabetes is different, and each person has different needs for a meal plan. Your health care provider may  recommend that you work with a diet and nutrition specialist (dietitian) to make a meal plan that is best for you. Your meal plan may vary depending on factors such as:  The calories you need.  The medicines you take.  Your weight.  Your blood glucose, blood pressure, and cholesterol levels.  Your activity level.  Other health conditions you have, such as heart or kidney disease. How do carbohydrates affect me? Carbohydrates, also called carbs, affect your blood glucose level more than any other type of food. Eating carbs naturally raises the amount of glucose in your blood. Carb counting is a method for keeping track of how many carbs you eat. Counting carbs is important to keep your blood glucose at a healthy level, especially if you use insulin or take certain oral diabetes medicines. It is important to know how many carbs you can safely have in each meal. This is different for every person. Your dietitian can help you calculate how many carbs you should have at each meal and for each snack. Foods that contain carbs include:  Bread, cereal, rice, pasta, and crackers.  Potatoes and corn.  Peas, beans, and lentils.  Milk and yogurt.  Fruit and juice.  Desserts, such as cakes, cookies, ice cream, and candy. How does alcohol affect me? Alcohol can cause a sudden decrease in blood glucose (hypoglycemia), especially if you use insulin or take certain oral diabetes medicines. Hypoglycemia can be a life-threatening condition. Symptoms of hypoglycemia (sleepiness, dizziness, and confusion) are similar to symptoms of having too much alcohol. If your health care provider says that alcohol is safe for you, follow these guidelines:  Limit alcohol intake to no more than 1 drink per day for nonpregnant women and 2 drinks per day for men. One drink equals 12 oz of beer, 5 oz of wine, or 1 oz of hard liquor.  Do not drink on an empty stomach.  Keep yourself hydrated with water, diet soda, or  unsweetened iced tea.  Keep in mind that regular soda, juice, and other mixers may contain a lot of sugar and must be counted as carbs. What are tips for following this plan?  Reading food labels  Start by checking the serving size on the "Nutrition Facts" label of packaged foods and drinks. The amount of calories, carbs, fats, and other nutrients listed on the label is based on one serving of the item. Many items contain more than one serving per package.  Check the total grams (g) of carbs in one serving. You can calculate the number of servings of carbs in one serving by dividing the total carbs by 15. For example, if a food has 30 g of total carbs, it would be equal to 2 servings of carbs.  Check the number of grams (g) of saturated and trans fats in one serving. Choose foods that have low or no amount of these fats.  Check the number of milligrams (mg) of salt (sodium) in one serving. Most people should limit total sodium intake to less than 2,300 mg per day.  Always check the nutrition information of foods labeled as "low-fat" or "nonfat". These foods  may be higher in added sugar or refined carbs and should be avoided.  Talk to your dietitian to identify your daily goals for nutrients listed on the label. Shopping  Avoid buying canned, premade, or processed foods. These foods tend to be high in fat, sodium, and added sugar.  Shop around the outside edge of the grocery store. This includes fresh fruits and vegetables, bulk grains, fresh meats, and fresh dairy. Cooking  Use low-heat cooking methods, such as baking, instead of high-heat cooking methods like deep frying.  Cook using healthy oils, such as olive, canola, or sunflower oil.  Avoid cooking with butter, cream, or high-fat meats. Meal planning  Eat meals and snacks regularly, preferably at the same times every day. Avoid going long periods of time without eating.  Eat foods high in fiber, such as fresh fruits,  vegetables, beans, and whole grains. Talk to your dietitian about how many servings of carbs you can eat at each meal.  Eat 4-6 ounces (oz) of lean protein each day, such as lean meat, chicken, fish, eggs, or tofu. One oz of lean protein is equal to: ? 1 oz of meat, chicken, or fish. ? 1 egg. ?  cup of tofu.  Eat some foods each day that contain healthy fats, such as avocado, nuts, seeds, and fish. Lifestyle  Check your blood glucose regularly.  Exercise regularly as told by your health care provider. This may include: ? 150 minutes of moderate-intensity or vigorous-intensity exercise each week. This could be brisk walking, biking, or water aerobics. ? Stretching and doing strength exercises, such as yoga or weightlifting, at least 2 times a week.  Take medicines as told by your health care provider.  Do not use any products that contain nicotine or tobacco, such as cigarettes and e-cigarettes. If you need help quitting, ask your health care provider.  Work with a Social worker or diabetes educator to identify strategies to manage stress and any emotional and social challenges. Questions to ask a health care provider  Do I need to meet with a diabetes educator?  Do I need to meet with a dietitian?  What number can I call if I have questions?  When are the best times to check my blood glucose? Where to find more information:  American Diabetes Association: diabetes.org  Academy of Nutrition and Dietetics: www.eatright.[employer] of Diabetes and Digestive and Kidney Diseases (NIH): [employer] Summary  A healthy meal plan will help you control your blood glucose and maintain a healthy lifestyle.  Working with a diet and nutrition specialist (dietitian) can help you make a meal plan that is best for you.  Keep in mind that carbohydrates (carbs) and alcohol have immediate effects on your blood glucose levels. It is important to count carbs and to use alcohol  carefully. This information is not intended to replace advice given to you by your health care provider. Make sure you discuss any questions you have with your health care provider. Document Revised: 03/02/2017 Document Reviewed: 04/24/2016 Elsevier Patient Education  2020 [employer].

## 2020-01-14 DIAGNOSIS — M1611 Unilateral primary osteoarthritis, right hip: Secondary | ICD-10-CM | POA: Diagnosis not present

## 2020-01-15 ENCOUNTER — Ambulatory Visit (INDEPENDENT_AMBULATORY_CARE_PROVIDER_SITE_OTHER): Payer: Medicare PPO

## 2020-01-15 DIAGNOSIS — Z0000 Encounter for general adult medical examination without abnormal findings: Secondary | ICD-10-CM | POA: Diagnosis not present

## 2020-01-15 NOTE — Progress Notes (Signed)
Subjective:   Evan Rivas is a 69 y.o. male who presents for Medicare Annual/Subsequent preventive examination.  Review of Systems           Objective:    There were no vitals filed for this visit. There is no height or weight on file to calculate BMI.  Advanced Directives 01/15/2020 11/24/2019 11/10/2019 09/05/2019 09/04/2019 09/24/2018 04/23/2018  Does Patient Have a Medical Advance Directive? No No No No No No No  Does patient want to make changes to medical advance directive? - - - - - - -  Copy of [place] in Chart? - - - - - - -  Would patient like information on creating a medical advance directive? No - Patient declined No - Patient declined No - Patient declined No - Patient declined - Yes (MAU/Ambulatory/Procedural Areas - Information given) No - Patient declined    Current Medications (verified) Outpatient Encounter Medications as of 01/15/2020  Medication Sig  . aspirin EC 81 MG tablet Take 81 mg by mouth daily. Swallow whole.  [name] atorvastatin (LIPITOR) 40 MG tablet Take 1 tablet (40 mg total) by mouth at bedtime.  . blood glucose meter kit and supplies Dispense based on patient and insurance preference. Use up to four times daily as directed. (FOR ICD-10 E10.9, E11.9).  . Dulaglutide (TRULICITY) 1.5 XN/2.3FT SOPN Inject 1.5 mg into the skin once a week.  . DULoxetine (CYMBALTA) 60 MG capsule TAKE 1 CAPSULE EVERY DAY  . fluticasone (FLONASE) 50 MCG/ACT nasal spray Place 2 sprays into both nostrils daily. 2 sprays (Patient taking differently: Place 2 sprays into both nostrils daily as needed for allergies. )  . gabapentin (NEURONTIN) 300 MG capsule Take 1 capsule (300 mg total) by mouth 3 (three) times daily. Emergency Supply while mail order comes in.  [name] glipiZIDE (GLUCOTROL) 10 MG tablet Take 1 tablet (10 mg total) by mouth daily before breakfast.  . glucose blood (ACCU-CHEK AVIVA PLUS) test strip Use as instructed  . glucose blood test strip True  Metrix Glucose Test Strip  . HYDROcodone-acetaminophen (NORCO) 10-325 MG tablet Take 1 tablet by mouth 3 (three) times daily.  . hydrOXYzine (ATARAX/VISTARIL) 25 MG tablet Take 25 mg by mouth 3 (three) times daily as needed for anxiety.   . metFORMIN (GLUCOPHAGE) 1000 MG tablet Take 1 tablet (1,000 mg total) by mouth 2 (two) times daily with a meal.  . metoprolol succinate (TOPROL-XL) 50 MG 24 hr tablet Take 1 tablet (50 mg total) by mouth daily. Take with or immediately following a meal. (Patient taking differently: Take 50 mg by mouth at bedtime. Take with or immediately following a meal.)  . Multiple Vitamin (MULTIVITAMIN) tablet Take 1 tablet by mouth daily.  [name] spironolactone (ALDACTONE) 25 MG tablet Take 2 tablets (50 mg total) by mouth daily.  [name] tiZANidine (ZANAFLEX) 4 MG tablet Take 1 tablet (4 mg total) by mouth every 8 (eight) hours as needed for muscle spasms.  . TRUEplus Lancets 33G MISC   . valsartan-hydrochlorothiazide (DIOVAN-HCT) 320-12.5 MG tablet TAKE 1 TABLET EVERY DAY  . [START ON 01/31/2020] HYDROcodone-acetaminophen (NORCO) 10-325 MG tablet Take 1 tablet by mouth 3 (three) times daily.  Derrill Memo ON 03/02/2020] HYDROcodone-acetaminophen (NORCO) 10-325 MG tablet Take 1 tablet by mouth 3 (three) times daily.  . [DISCONTINUED] Dulaglutide (TRULICITY) 7.32 KG/2.5KY SOPN Inject 0.75 mg into the skin every Wednesday.   No facility-administered encounter medications on file as of 01/15/2020.    Allergies (verified) Celebrex [celecoxib],  Tetanus toxoids, and Tetanus-diphtheria toxoids td   History: Past Medical History:  Diagnosis Date  . Anxiety    panic attacks  . Arthritis   . Cancer (HCC)    lip/basal  . Chronic kidney disease   . Depression   . Diabetes mellitus without complication ([place])    type 2  . GERD (gastroesophageal reflux disease)   . Gout   . History of kidney stones   . Hyperaldosteronism ([place])    bilateral adrenal hyperplasia  . Hyperlipidemia   .  Hypertension   . Pneumonia    2014 and 2018 ( 2018-klebsiella pneumonia   . Sleep apnea    Past Surgical History:  Procedure Laterality Date  . ARTHROSCOPIC REPAIR ACL Left   . CYSTOSCOPY W/ URETERAL STENT PLACEMENT Right 09/07/2014   Procedure: CYSTOSCOPY WITH STENT REPLACEMENT;  Surgeon: Hollice Espy, MD;  Location: [facility];  Service: Urology;  Laterality: Right;  . CYSTOSCOPY WITH INSERTION OF UROLIFT N/A 11/24/2019   Procedure: CYSTOSCOPY WITH INSERTION OF UROLIFT;  Surgeon: Hollice Espy, MD;  Location: [facility];  Service: Urology;  Laterality: N/A;  . CYSTOSCOPY/URETEROSCOPY/HOLMIUM LASER/STENT PLACEMENT Right 08/17/2014   Procedure: CYSTOSCOPY/URETEROSCOPY//STENT PLACEMENT/ attempt of lithotripsy;  Surgeon: Hollice Espy, MD;  Location: [facility];  Service: Urology;  Laterality: Right;  . HERNIA REPAIR    . JOINT REPLACEMENT    . MOHS SURGERY     lip  . TOTAL KNEE ARTHROPLASTY Left 04/24/2016   Procedure: LEFT TOTAL KNEE ARTHROPLASTY;  Surgeon: Gaynelle Arabian, MD;  Location: [facility];  Service: Orthopedics;  Laterality: Left;  Adductor Block  . TOTAL KNEE ARTHROPLASTY Right 10/23/2016   Procedure: RIGHT TOTAL KNEE ARTHROPLASTY;  Surgeon: Gaynelle Arabian, MD;  Location: [facility];  Service: Orthopedics;  Laterality: Right;  . URETEROSCOPY WITH HOLMIUM LASER LITHOTRIPSY Right 09/07/2014   Procedure: URETEROSCOPY WITH HOLMIUM LASER LITHOTRIPSY;  Surgeon: Hollice Espy, MD;  Location: [facility];  Service: Urology;  Laterality: Right;  [name] VASECTOMY     Family History  Problem Relation Age of Onset  . Hypertension Mother   . Cancer Mother   . Hyperlipidemia Mother   . Arthritis Mother   . Stroke Father   . Heart disease Father   . Obesity Father   . Depression Sister   . Hypertension Sister   . Hyperlipidemia Sister   . Learning disabilities Sister   . Obesity Sister   . Kidney disease Maternal Grandmother    Social History   Socioeconomic History  . Marital status: Married     Spouse name: Hoyle Sauer  . Number of children: 1  . Years of education: Not on file  . Highest education level: Not on file  Occupational History  . Occupation: retired  Tobacco Use  . Smoking status: Never Smoker  . Smokeless tobacco: Never Used  Vaping Use  . Vaping Use: Never used  Substance and Sexual Activity  . Alcohol use: No  . Drug use: No  . Sexual activity: Not Currently    Birth control/protection: None  Other Topics Concern  . Not on file  Social History Narrative  . Not on file   Social Determinants of Health   Financial Resource Strain: High Risk  . Difficulty of Paying Living Expenses: Hard  Food Insecurity: No Food Insecurity  . Worried About Charity fundraiser in the Last Year: Never true  . Ran Out of Food in the Last Year: Never true  Transportation Needs: No Transportation Needs  . Lack of  Transportation (Medical): No  . Lack of Transportation (Non-Medical): No  Physical Activity: Inactive  . Days of Exercise per Week: 0 days  . Minutes of Exercise per Session: 0 min  Stress: No Stress Concern Present  . Feeling of Stress : Only a little  Social Connections: Socially Integrated  . Frequency of Communication with Friends and Family: More than three times a week  . Frequency of Social Gatherings with Friends and Family: More than three times a week  . Attends Religious Services: More than 4 times per year  . Active Member of Clubs or Organizations: Yes  . Attends Archivist Meetings: 1 to 4 times per year  . Marital Status: Married    Tobacco Counseling Counseling given: Not Answered   Clinical Intake:  Pre-visit preparation completed: Yes  Pain : No/denies pain     Nutritional Risks: None Diabetes: Yes CBG done?: No Did pt. bring in CBG monitor from home?: No  How often do you need to have someone help you when you read instructions, pamphlets, or other written materials from your doctor or pharmacy?: 1 - Never  Nutrition  Risk Assessment:  Has the patient had any N/V/D within the last 2 months?  No  Does the patient have any non-healing wounds?  No  Has the patient had any unintentional weight loss or weight gain?  No   Diabetes:  Is the patient diabetic?  Yes  If diabetic, was a CBG obtained today?  No  Did the patient bring in their glucometer from home?  No  How often do you monitor your CBG's? daily.   Financial Strains and Diabetes Management:  Are you having any financial strains with the device, your supplies or your medication? Yes - receiving assistance for trulicity.  Does the patient want to be seen by Chronic Care Management for management of their diabetes?  Yes  - already enrolled Would the patient like to be referred to a Nutritionist or for Diabetic Management?  No   Diabetic Exams:  Diabetic Eye Exam:  Overdue for diabetic eye exam. Pt has been advised about the importance in completing this exam.   Diabetic Foot Exam: Pt has been advised about the importance in completing this exam. Pt is scheduled for diabetic foot exam on 03/16/20.    Interpreter Needed?: No  Information entered by :: Clemetine Marker LPN   Activities of Daily Living In your present state of health, do you have any difficulty performing the following activities: 12/31/2019 12/15/2019  Hearing? N N  Vision? N N  Difficulty concentrating or making decisions? N N  Walking or climbing stairs? Y Y  Dressing or bathing? N N  Doing errands, shopping? N N  Some recent data might be hidden    Patient Care Team: Towanda Malkin, MD as PCP - General (Internal Medicine) End, Harrell Gave, MD as PCP - Cardiology (Cardiology) Verdia Kuba, [facility] (Pharmacist)  Indicate any recent Medical Services you may have received from other than [facility] providers in the past year (date may be approximate).     Assessment:   This is a routine wellness examination for Ervin.  Hearing/Vision screen  Hearing Screening    '125Hz'  '250Hz'  '500Hz'  '1000Hz'  '2000Hz'  '3000Hz'  '4000Hz'  '6000Hz'  '8000Hz'   Right ear:           Left ear:           Comments: Pt denies hearing difficulty  Vision Screening Comments: Annual vision screenings with Dr. Gloriann Loan  Dietary  issues and exercise activities discussed:    Goals    . Chronic Care Management     CARE PLAN ENTRY (see longitudinal plan of care for additional care plan information)  Current Barriers:  . Chronic Disease Management support, education, and care coordination needs related to Hypertension, Hyperlipidemia, Diabetes, and BPH   Hypertension BP Readings from Last 3 Encounters:  09/30/19 138/64  09/29/19 118/70  09/23/19 139/86   . Pharmacist Clinical Goal(s): o Over the next 90 days, patient will work with PharmD and providers to maintain BP goal <130/80 . Current regimen:  o Diovan-HCT 320-74m daily o Toprol-XL 547mdaily with food o Spironolactone 5079maily . Interventions: o None . Patient self care activities - Over the next 90 days, patient will: o Check BP weekly, document, and provide at future appointments o Ensure daily salt intake < 2300 mg/day  Hyperlipidemia Lab Results  Component Value Date/Time   LDLCALC 78 06/28/2018 10:19 AM   . Pharmacist Clinical Goal(s): o Over the next 90 days, patient will work with PharmD and providers to achieve LDL goal < 70 . Current regimen:  o Lipitor 46m15mily . Interventions: o None . Patient self care activities - Over the next 90 days, patient will: o Take atorvastatin 46mg25mly as directed  Diabetes Lab Results  Component Value Date/Time   HGBA1C 8.9 (H) 09/04/2019 07:12 PM   HGBA1C 7.3 (H) 04/07/2019 10:58 AM   HGBA1C 8.9 03/25/2018 10:00 AM   HGBA1C 8.9 (A) 03/25/2018 10:00 AM   HGBA1C 8.9 (A) 03/25/2018 10:00 AM   HGBA1C 8.0 (A) 12/18/2017 08:57 AM   . Pharmacist Clinical Goal(s): o Over the next 90 days, patient will work with PharmD and providers to achieve A1c goal <7% . Current regimen:    o Metformin 1000mg 52me daily o Trulicity 0.75mg9.81XBt weekly o Glipizide 10mg d65m . Interventions: o New PAP order to [facility] to increase Trulicity 1.5mg d1.4NW. Patient self care activities - Over the next 90 days, patient will: o Check blood sugar once daily, document, and provide at future appointments o Contact provider with any episodes of hypoglycemia  BPH . Pharmacist Clinical Goal(s) o Over the next 90 days, patient will work with PharmD and providers to improve bladder control . Current regimen:  o Tamsulosin 0.4mg dai19m. Interventions: o None . Patient self care activities - Over the next 90 days, patient will: o Work with provider and PharmD to achieve optimal bladder control  Medication management . Pharmacist Clinical Goal(s): o Over the next 90 days, patient will work with PharmD and providers to maintain optimal medication adherence . Current pharmacy: [facility] . Interventions o Comprehensive medication review performed. o Continue current medication management strategy . Patient self care activities - Over the next 90 days, patient will: o Focus on medication adherence by ensuring [facility] sends increased dose of Trulicity o Take medications as prescribed o Report any questions or concerns to PharmD and/or provider(s)  Initial goal documentation       Depression Screen PHQ 2/9 Scores 01/15/2020 12/31/2019 12/15/2019 09/30/2019 09/29/2019 09/16/2019 06/27/2019  PHQ - 2 Score 2 0 0 0 0 0 2  PHQ- 9 Score 2 - - 0 0 2 7    Fall Risk Fall Risk  01/15/2020 12/31/2019 12/15/2019 09/30/2019 09/29/2019  Falls in the past year? 0 0 0 0 0  Number falls in past yr: 0 0 0 0 0  Injury with Fall? 0 0 0 0 0  Risk for  fall due to : No Fall Risks - - - -  Follow up Falls prevention discussed Falls evaluation completed Falls evaluation completed - -    Any stairs in or around the home? No  If so, are there any without handrails? No  Home free of loose throw rugs in walkways, pet  beds, electrical cords, etc? Yes  Adequate lighting in your home to reduce risk of falls? Yes   ASSISTIVE DEVICES UTILIZED TO PREVENT FALLS:  Life alert? No  Use of a cane, walker or w/c? No  Grab bars in the bathroom? No  Shower chair or bench in shower? Yes  Elevated toilet seat or a handicapped toilet? Yes   TIMED UP AND GO:  Was the test performed? No . Telephonic visit.   Cognitive Function:     6CIT Screen 09/24/2018  What Year? 0 points  What month? 0 points  What time? 0 points  Count back from 20 0 points  Months in reverse 0 points  Repeat phrase 0 points  Total Score 0    Immunizations Immunization History  Administered Date(s) Administered  . Fluad Quad(high Dose 65+) 11/20/2018, 12/15/2019  . Influenza, High Dose Seasonal PF 12/22/2015, 01/22/2017, 12/18/2017  . Influenza, Seasonal, Injecte, Preservative Fre 12/18/2011  . Influenza,inj,Quad PF,6+ Mos 01/06/2013, 12/05/2013, 01/06/2015  . Influenza-Unspecified 12/02/2013  . PFIZER SARS-COV-2 Vaccination 06/05/2019, 06/26/2019  . Pneumococcal Conjugate-13 09/17/2017  . Pneumococcal Polysaccharide-23 07/20/2016  . Tdap 06/05/2013    TDAP status: Up to date   Flu Vaccine status: Up to date   Pneumococcal vaccine status: Up to date   Covid-19 vaccine status: Completed vaccines  Qualifies for Shingles Vaccine? Yes   Zostavax completed No   Shingrix Completed?: No.    Education has been provided regarding the importance of this vaccine. Patient has been advised to call insurance company to determine out of pocket expense if they have not yet received this vaccine. Advised may also receive vaccine at local pharmacy or Health Dept. Verbalized acceptance and understanding.  Screening Tests Health Maintenance  Topic Date Due  . FOOT EXAM  05/08/2018  . Hepatitis C Screening  01/17/2024 (Originally 1950/09/03)  . TETANUS/TDAP  02/24/2028 (Originally 06/06/2023)  . OPHTHALMOLOGY EXAM  05/31/2020  . HEMOGLOBIN  A1C  06/29/2020  . COLONOSCOPY  02/01/2022  . INFLUENZA VACCINE  Completed  . COVID-19 Vaccine  Completed  . PNA vac Low Risk Adult  Completed    Health Maintenance  Health Maintenance Due  Topic Date Due  . FOOT EXAM  05/08/2018    Colorectal cancer screening: Completed 02/02/12. Repeat every 10 years  Lung Cancer Screening: (Low Dose CT Chest recommended if Age 42-80 years, 30 pack-year currently smoking OR have quit w/in 15years.) does not qualify.   Additional Screening:  Hepatitis C Screening: does qualify; postponed  Vision Screening: Recommended annual ophthalmology exams for early detection of glaucoma and other disorders of the eye. Is the patient up to date with their annual eye exam?  No  Who is the provider or what is the name of the office in which the patient attends annual eye exams? Dr. Gloriann Loan  Dental Screening: Recommended annual dental exams for proper oral hygiene  Community Resource Referral / Chronic Care Management: CRR required this visit?  No   CCM required this visit?  No      Plan:     I have personally reviewed and noted the following in the patient's chart:   . Medical and social history .  Use of alcohol, tobacco or illicit drugs  . Current medications and supplements . Functional ability and status . Nutritional status . Physical activity . Advanced directives . List of other physicians . Hospitalizations, surgeries, and ER visits in previous 12 months . Vitals . Screenings to include cognitive, depression, and falls . Referrals and appointments  In addition, I have reviewed and discussed with patient certain preventive protocols, quality metrics, and best practice recommendations. A written personalized care plan for preventive services as well as general preventive health recommendations were provided to patient.     Clemetine Marker, LPN   [id number]   Nurse Notes: none

## 2020-01-15 NOTE — Patient Instructions (Signed)
Evan Rivas , Thank you for taking time to come for your Medicare Wellness Visit. I appreciate your ongoing commitment to your health goals. Please review the following plan we discussed and let me know if I can assist you in the future.   Screening recommendations/referrals: Colonoscopy: done 02/08/12 Recommended yearly ophthalmology/optometry visit for glaucoma screening and checkup Recommended yearly dental visit for hygiene and checkup  Vaccinations: Influenza vaccine: done 12/15/19 Pneumococcal vaccine: done 09/17/17 Tdap vaccine: done 06/05/13 Shingles vaccine: Shingrix discussed. Please contact your pharmacy for coverage information.  Covid-19: done 06/05/19 7 06/26/19  Advanced directives: Please bring a copy of your health care power of attorney and living will to the office at your convenience once you have completed that paperwork  Conditions/risks identified: Recommend increasing physical activity as tolerated  Next appointment: Follow up in one year for your annual wellness visit.   Preventive Care 69 Years and Older, Male Preventive care refers to lifestyle choices and visits with your health care provider that can promote health and wellness. What does preventive care include?  A yearly physical exam. This is also called an annual well check.  Dental exams once or twice a year.  Routine eye exams. Ask your health care provider how often you should have your eyes checked.  Personal lifestyle choices, including:  Daily care of your teeth and gums.  Regular physical activity.  Eating a healthy diet.  Avoiding tobacco and drug use.  Limiting alcohol use.  Practicing safe sex.  Taking low doses of aspirin every day.  Taking vitamin and mineral supplements as recommended by your health care provider. What happens during an annual well check? The services and screenings done by your health care provider during your annual well check will depend on your age, overall  health, lifestyle risk factors, and family history of disease. Counseling  Your health care provider may ask you questions about your:  Alcohol use.  Tobacco use.  Drug use.  Emotional well-being.  Home and relationship well-being.  Sexual activity.  Eating habits.  History of falls.  Memory and ability to understand (cognition).  Work and work Statistician. Screening  You may have the following tests or measurements:  Height, weight, and BMI.  Blood pressure.  Lipid and cholesterol levels. These may be checked every 5 years, or more frequently if you are over 6 years old.  Skin check.  Lung cancer screening. You may have this screening every year starting at age 69 if you have a 30-pack-year history of smoking and currently smoke or have quit within the past 15 years.  Fecal occult blood test (FOBT) of the stool. You may have this test every year starting at age 69.  Flexible sigmoidoscopy or colonoscopy. You may have a sigmoidoscopy every 5 years or a colonoscopy every 10 years starting at age 69.  Prostate cancer screening. Recommendations will vary depending on your family history and other risks.  Hepatitis C blood test.  Hepatitis B blood test.  Sexually transmitted disease (STD) testing.  Diabetes screening. This is done by checking your blood sugar (glucose) after you have not eaten for a while (fasting). You may have this done every 1-3 years.  Abdominal aortic aneurysm (AAA) screening. You may need this if you are a current or former smoker.  Osteoporosis. You may be screened starting at age 42 if you are at high risk. Talk with your health care provider about your test results, treatment options, and if necessary, the need for more tests.  Vaccines  Your health care provider may recommend certain vaccines, such as:  Influenza vaccine. This is recommended every year.  Tetanus, diphtheria, and acellular pertussis (Tdap, Td) vaccine. You may need a Td  booster every 10 years.  Zoster vaccine. You may need this after age 69.  Pneumococcal 13-valent conjugate (PCV13) vaccine. One dose is recommended after age 69.  Pneumococcal polysaccharide (PPSV23) vaccine. One dose is recommended after age 69. Talk to your health care provider about which screenings and vaccines you need and how often you need them. This information is not intended to replace advice given to you by your health care provider. Make sure you discuss any questions you have with your health care provider. Document Released: 04/16/2015 Document Revised: 12/08/2015 Document Reviewed: 01/19/2015 Elsevier Interactive Patient Education  2017 [employer] Prevention in the Home Falls can cause injuries. They can happen to people of all ages. There are many things you can do to make your home safe and to help prevent falls. What can I do on the outside of my home?  Regularly fix the edges of walkways and driveways and fix any cracks.  Remove anything that might make you trip as you walk through a door, such as a raised step or threshold.  Trim any bushes or trees on the path to your home.  Use bright outdoor lighting.  Clear any walking paths of anything that might make someone trip, such as rocks or tools.  Regularly check to see if handrails are loose or broken. Make sure that both sides of any steps have handrails.  Any raised decks and porches should have guardrails on the edges.  Have any leaves, snow, or ice cleared regularly.  Use sand or salt on walking paths during winter.  Clean up any spills in your garage right away. This includes oil or grease spills. What can I do in the bathroom?  Use night lights.  Install grab bars by the toilet and in the tub and shower. Do not use towel bars as grab bars.  Use non-skid mats or decals in the tub or shower.  If you need to sit down in the shower, use a plastic, non-slip stool.  Keep the floor dry. Clean up  any water that spills on the floor as soon as it happens.  Remove soap buildup in the tub or shower regularly.  Attach bath mats securely with double-sided non-slip rug tape.  Do not have throw rugs and other things on the floor that can make you trip. What can I do in the bedroom?  Use night lights.  Make sure that you have a light by your bed that is easy to reach.  Do not use any sheets or blankets that are too big for your bed. They should not hang down onto the floor.  Have a firm chair that has side arms. You can use this for support while you get dressed.  Do not have throw rugs and other things on the floor that can make you trip. What can I do in the kitchen?  Clean up any spills right away.  Avoid walking on wet floors.  Keep items that you use a lot in easy-to-reach places.  If you need to reach something above you, use a strong step stool that has a grab bar.  Keep electrical cords out of the way.  Do not use floor polish or wax that makes floors slippery. If you must use wax, use non-skid floor wax.  Do not have throw rugs and other things on the floor that can make you trip. What can I do with my stairs?  Do not leave any items on the stairs.  Make sure that there are handrails on both sides of the stairs and use them. Fix handrails that are broken or loose. Make sure that handrails are as long as the stairways.  Check any carpeting to make sure that it is firmly attached to the stairs. Fix any carpet that is loose or worn.  Avoid having throw rugs at the top or bottom of the stairs. If you do have throw rugs, attach them to the floor with carpet tape.  Make sure that you have a light switch at the top of the stairs and the bottom of the stairs. If you do not have them, ask someone to add them for you. What else can I do to help prevent falls?  Wear shoes that:  Do not have high heels.  Have rubber bottoms.  Are comfortable and fit you well.  Are  closed at the toe. Do not wear sandals.  If you use a stepladder:  Make sure that it is fully opened. Do not climb a closed stepladder.  Make sure that both sides of the stepladder are locked into place.  Ask someone to hold it for you, if possible.  Clearly mark and make sure that you can see:  Any grab bars or handrails.  First and last steps.  Where the edge of each step is.  Use tools that help you move around (mobility aids) if they are needed. These include:  Canes.  Walkers.  Scooters.  Crutches.  Turn on the lights when you go into a dark area. Replace any light bulbs as soon as they burn out.  Set up your furniture so you have a clear path. Avoid moving your furniture around.  If any of your floors are uneven, fix them.  If there are any pets around you, be aware of where they are.  Review your medicines with your doctor. Some medicines can make you feel dizzy. This can increase your chance of falling. Ask your doctor what other things that you can do to help prevent falls. This information is not intended to replace advice given to you by your health care provider. Make sure you discuss any questions you have with your health care provider. Document Released: 01/14/2009 Document Revised: 08/26/2015 Document Reviewed: 04/24/2014 Elsevier Interactive Patient Education  2017 [employer].

## 2020-01-28 ENCOUNTER — Encounter: Payer: Self-pay | Admitting: Internal Medicine

## 2020-01-28 ENCOUNTER — Ambulatory Visit: Payer: Medicare PPO | Admitting: Internal Medicine

## 2020-01-28 ENCOUNTER — Other Ambulatory Visit: Payer: Self-pay

## 2020-01-28 ENCOUNTER — Other Ambulatory Visit: Payer: Self-pay | Admitting: *Deleted

## 2020-01-28 VITALS — BP 132/82 | HR 80 | Ht 75.0 in | Wt 265.0 lb

## 2020-01-28 DIAGNOSIS — E782 Mixed hyperlipidemia: Secondary | ICD-10-CM | POA: Diagnosis not present

## 2020-01-28 DIAGNOSIS — E269 Hyperaldosteronism, unspecified: Secondary | ICD-10-CM

## 2020-01-28 DIAGNOSIS — N1832 Chronic kidney disease, stage 3b: Secondary | ICD-10-CM | POA: Diagnosis not present

## 2020-01-28 DIAGNOSIS — I152 Hypertension secondary to endocrine disorders: Secondary | ICD-10-CM

## 2020-01-28 DIAGNOSIS — E1122 Type 2 diabetes mellitus with diabetic chronic kidney disease: Secondary | ICD-10-CM | POA: Diagnosis not present

## 2020-01-28 MED ORDER — METOPROLOL SUCCINATE ER 100 MG PO TB24: 100.0000 mg | ORAL_TABLET | Freq: Every day | ORAL | 2 refills | Status: DC

## 2020-01-28 NOTE — Patient Instructions (Signed)
Medication Instructions:  Your physician has recommended you make the following change in your medication:  1- INCREASE Metoprolol to 100 mg by mouth once a day.  *If you need a refill on your cardiac medications before your next appointment, please call your pharmacy*  Follow-Up: At [facility], you and your health needs are our priority.  As part of our continuing mission to provide you with exceptional heart care, we have created designated Provider Care Teams.  These Care Teams include your primary Cardiologist (physician) and Advanced Practice Providers (APPs -  Physician Assistants and Nurse Practitioners) who all work together to provide you with the care you need, when you need it.  We recommend signing up for the patient portal called "MyChart".  Sign up information is provided on this After Visit Summary.  MyChart is used to connect with patients for Virtual Visits (Telemedicine).  Patients are able to view lab/test results, encounter notes, upcoming appointments, etc.  Non-urgent messages can be sent to your provider as well.   To learn more about what you can do with MyChart, go to [email or web address].    Your next appointment:   6 month(s)  The format for your next appointment:   In Person  Provider:   You may see Nelva Bush, MD or one of the following Advanced Practice Providers on your designated Care Team:    Murray Hodgkins, NP  Christell Faith, PA-C  Marrianne Mood, PA-C  Cadence Agenda, [place]

## 2020-01-28 NOTE — Progress Notes (Signed)
Follow-up Outpatient Visit Date: 01/28/2020  Primary Care Provider: Towanda Malkin, MD [address] [name] [address]  Chief Complaint: Follow-up hypertension  HPI:  [name] is a 69 y.o. male with history of diastolic dysfunction, hyperaldosteronism secondary to adrenal hyperplasia, hypertension, hyperlipidemia, type 2 diabetes mellitus, cancer of the lip, anxiety, depression, gout, and arthritis with chronic pain, who presents for follow-up of hypertension and edema. He was last seen in our office by [name], NP, in June. No medication changes were made at that time.  Today, [name] reports that he has been feeling quite well.  He denies chest pain, shortness of breath, palpitations, lightheadedness, and edema.  He was seen by his nephrologist a couple of weeks ago and was told to decrease spironolactone to 25 mg daily due to renal insufficiency and hyperkalemia.  He has been trying to increase his water intake.  He notes that his diabetes has been suboptimally controlled with a recent hemoglobin A1c of 9.2%.  Home blood pressures are typically in the 878M to 767M systolic.  --------------------------------------------------------------------------------------------------  Past Medical History:  Diagnosis Date  . Anxiety    panic attacks  . Arthritis   . Cancer (HCC)    lip/basal  . Chronic kidney disease   . Depression   . Diabetes mellitus without complication ([place])    type 2  . GERD (gastroesophageal reflux disease)   . Gout   . History of kidney stones   . Hyperaldosteronism ([place])    bilateral adrenal hyperplasia  . Hyperlipidemia   . Hypertension   . Pneumonia    2014 and 2018 ( 2018-klebsiella pneumonia   . Sleep apnea    Past Surgical History:  Procedure Laterality Date  . ARTHROSCOPIC REPAIR ACL Left   . CYSTOSCOPY W/ URETERAL STENT PLACEMENT Right 09/07/2014   Procedure: CYSTOSCOPY WITH STENT REPLACEMENT;  Surgeon:  Hollice Espy, MD;  Location: [facility] OR[facility];  Service: Urology;  Laterality: Right;  . CYSTOSCOPY WITH INSERTION OF UROLIFT N/A 11/24/2019   Procedure: CYSTOSCOPY WITH INSERTION OF UROLIFT;  Surgeon: Hollice Espy, MD;  Location: [facility];  Service: Urology;  Laterality: N/A;  . CYSTOSCOPY/URETEROSCOPY/HOLMIUM LASER/STENT PLACEMENT Right 08/17/2014   Procedure: CYSTOSCOPY/URETEROSCOPY//STENT PLACEMENT/ attempt of lithotripsy;  Surgeon: Hollice Espy, MD;  Location: [facility];  Service: Urology;  Laterality: Right;  . HERNIA REPAIR    . JOINT REPLACEMENT    . MOHS SURGERY     lip  . TOTAL KNEE ARTHROPLASTY Left 04/24/2016   Procedure: LEFT TOTAL KNEE ARTHROPLASTY;  Surgeon: Gaynelle Arabian, MD;  Location: [facility];  Service: Orthopedics;  Laterality: Left;  Adductor Block  . TOTAL KNEE ARTHROPLASTY Right 10/23/2016   Procedure: RIGHT TOTAL KNEE ARTHROPLASTY;  Surgeon: Gaynelle Arabian, MD;  Location: [facility];  Service: Orthopedics;  Laterality: Right;  . URETEROSCOPY WITH HOLMIUM LASER LITHOTRIPSY Right 09/07/2014   Procedure: URETEROSCOPY WITH HOLMIUM LASER LITHOTRIPSY;  Surgeon: Hollice Espy, MD;  Location: [facility];  Service: Urology;  Laterality: Right;  [name] VASECTOMY      Current Meds  Medication Sig  . aspirin EC 81 MG tablet Take 81 mg by mouth daily. Swallow whole.  [name] atorvastatin (LIPITOR) 40 MG tablet Take 1 tablet (40 mg total) by mouth at bedtime.  . blood glucose meter kit and supplies Dispense based on patient and insurance preference. Use up to four times daily as directed. (FOR ICD-10 E10.9, E11.9).  . Dulaglutide (TRULICITY) 1.5 CN/4.7SJ SOPN Inject 1.5 mg into the skin once  a week.  . DULoxetine (CYMBALTA) 60 MG capsule TAKE 1 CAPSULE EVERY DAY  . fluticasone (FLONASE) 50 MCG/ACT nasal spray Place 2 sprays into both nostrils daily. 2 sprays (Patient taking differently: Place 2 sprays into both nostrils daily as needed for allergies. )  . gabapentin (NEURONTIN) 300 MG capsule Take 1  capsule (300 mg total) by mouth 3 (three) times daily. Emergency Supply while mail order comes in.  [name] glipiZIDE (GLUCOTROL) 10 MG tablet Take 1 tablet (10 mg total) by mouth daily before breakfast.  . glucose blood (ACCU-CHEK AVIVA PLUS) test strip Use as instructed  . glucose blood test strip True Metrix Glucose Test Strip  . HYDROcodone-acetaminophen (NORCO) 10-325 MG tablet Take 1 tablet by mouth 3 (three) times daily.  Derrill Memo ON 01/31/2020] HYDROcodone-acetaminophen (NORCO) 10-325 MG tablet Take 1 tablet by mouth 3 (three) times daily.  Derrill Memo ON 03/02/2020] HYDROcodone-acetaminophen (NORCO) 10-325 MG tablet Take 1 tablet by mouth 3 (three) times daily.  . hydrOXYzine (ATARAX/VISTARIL) 25 MG tablet Take 25 mg by mouth 3 (three) times daily as needed for anxiety.   . metFORMIN (GLUCOPHAGE) 1000 MG tablet Take 1 tablet (1,000 mg total) by mouth 2 (two) times daily with a meal.  . metoprolol succinate (TOPROL-XL) 50 MG 24 hr tablet Take 1 tablet (50 mg total) by mouth daily. Take with or immediately following a meal. (Patient taking differently: Take 50 mg by mouth at bedtime. Take with or immediately following a meal.)  . Multiple Vitamin (MULTIVITAMIN) tablet Take 1 tablet by mouth daily.  [name] spironolactone (ALDACTONE) 25 MG tablet Take 2 tablets (50 mg total) by mouth daily.  [name] tiZANidine (ZANAFLEX) 4 MG tablet Take 1 tablet (4 mg total) by mouth every 8 (eight) hours as needed for muscle spasms.  . TRUEplus Lancets 33G MISC   . valsartan-hydrochlorothiazide (DIOVAN-HCT) 320-12.5 MG tablet TAKE 1 TABLET EVERY DAY    Allergies: Celebrex [celecoxib], Tetanus toxoids, and Tetanus-diphtheria toxoids td  Social History   Tobacco Use  . Smoking status: Never Smoker  . Smokeless tobacco: Never Used  Vaping Use  . Vaping Use: Never used  Substance Use Topics  . Alcohol use: No  . Drug use: No    Family History  Problem Relation Age of Onset  . Hypertension Mother   . Cancer Mother     . Hyperlipidemia Mother   . Arthritis Mother   . Stroke Father   . Heart disease Father   . Obesity Father   . Depression Sister   . Hypertension Sister   . Hyperlipidemia Sister   . Learning disabilities Sister   . Obesity Sister   . Kidney disease Maternal Grandmother     Review of Systems: A 12-system review of systems was performed and was negative except as noted in the HPI.  --------------------------------------------------------------------------------------------------  Physical Exam: BP 132/82 (BP Location: Left Arm, Patient Position: Sitting, Cuff Size: Normal)   Pulse 80   Ht _0  (1.905 m)   Wt 265 lb (120.2 kg)   SpO2 97%   BMI 33.12 kg/m   General: NAD. Neck: No JVD or HJR. Lungs: Clear to auscultation bilateral without wheezes or crackles. Heart: Regular rate and rhythm without murmurs, rubs, or gallops. Abdomen: Soft, nontender, nondistended. Extremities: No lower extremity edema.   Lab Results  Component Value Date   WBC 5.0 09/07/2019   HGB 10.5 (L) 09/07/2019   HCT 31.3 (L) 09/07/2019   MCV 96.0 09/07/2019   PLT 182 09/07/2019  Lab Results  Component Value Date   NA 140 12/31/2019   K 5.7 (H) 12/31/2019   CL 106 12/31/2019   CO2 22 12/31/2019   BUN 46 (H) 12/31/2019   CREATININE 1.96 (H) 12/31/2019   GLUCOSE 265 (H) 12/31/2019   ALT 24 12/31/2019    Lab Results  Component Value Date   CHOL 156 12/31/2019   HDL 34 (L) 12/31/2019   LDLCALC 82 12/31/2019   TRIG 336 (H) 12/31/2019   CHOLHDL 4.6 12/31/2019    --------------------------------------------------------------------------------------------------  ASSESSMENT AND PLAN: Hypertension and hyperaldosteronism: Blood pressure remains mildly elevated today at 132/80 (goal less than 130/80).  Given recent de-escalation of spironolactone, I am concerned that blood pressure will increase further in the setting of hyperaldosteronism.  We have therefore agreed to increase  metoprolol to 100 mg daily.  I will defer additional medication changes today.  Ongoing follow-up and management of renal insufficiency will be deferred to nephrology.  I encouraged Mr. Bennette to continue limiting his sodium intake and to work on weight loss through diet and exercise.  Hyperlipidemia: LDL reasonable at 82 on last check.  Continue atorvastatin 40 mg daily.  Continue to work on lifestyle modifications to help bring down triglycerides.  Type 2 diabetes mellitus with chronic kidney disease stage IIIb: Glucose suboptimally controlled with recent hemoglobin A1c of 9.2%.  I encouraged Mr. Sarracino to work on lifestyle modifications as well as to follow closely with his PCP for ongoing management.  Follow-up: Return to clinic in 6 months.  Nelva Bush, MD 01/28/2020 1:52 PM

## 2020-02-09 ENCOUNTER — Other Ambulatory Visit: Payer: Self-pay

## 2020-02-09 DIAGNOSIS — E119 Type 2 diabetes mellitus without complications: Secondary | ICD-10-CM

## 2020-02-09 MED ORDER — METFORMIN HCL 1000 MG PO TABS: 1000.0000 mg | ORAL_TABLET | Freq: Two times a day (BID) | ORAL | 1 refills | Status: DC

## 2020-02-12 ENCOUNTER — Other Ambulatory Visit: Payer: Self-pay | Admitting: Family Medicine

## 2020-02-12 DIAGNOSIS — F419 Anxiety disorder, unspecified: Secondary | ICD-10-CM

## 2020-02-12 DIAGNOSIS — M1611 Unilateral primary osteoarthritis, right hip: Secondary | ICD-10-CM | POA: Diagnosis not present

## 2020-02-12 DIAGNOSIS — F331 Major depressive disorder, recurrent, moderate: Secondary | ICD-10-CM

## 2020-02-12 DIAGNOSIS — I10 Essential (primary) hypertension: Secondary | ICD-10-CM

## 2020-03-03 ENCOUNTER — Telehealth: Payer: Self-pay

## 2020-03-03 NOTE — Progress Notes (Signed)
° ° °  Chronic Care Management Pharmacy Assistant   Name: Evan Rivas  MRN: [id number] DOB: [birth year]  Reason for Encounter: Medication Management  Patient Questions:  1.  Have you seen any other providers since your last visit? Yes, Nelva Bush, MD and Suella Broad  2.  Any changes in your medicines or health? Yes, increase Metoprolol 100 mg daily   Evan Rivas,  69 y.o. , male  PCP : Towanda Malkin, MD  Allergies:   Allergies  Allergen Reactions   Celebrex [Celecoxib]     GI issues   Tetanus Toxoids Other (See Comments)    unkown   Tetanus-Diphtheria Toxoids Td     Unknown    Medications: Outpatient Encounter Medications as of 03/03/2020  Medication Sig   aspirin EC 81 MG tablet Take 81 mg by mouth daily. Swallow whole.   atorvastatin (LIPITOR) 40 MG tablet Take 1 tablet (40 mg total) by mouth at bedtime.   blood glucose meter kit and supplies Dispense based on patient and insurance preference. Use up to four times daily as directed. (FOR ICD-10 E10.9, E11.9).   Dulaglutide (TRULICITY) 1.5 FG/9.0SX SOPN Inject 1.5 mg into the skin once a week.   DULoxetine (CYMBALTA) 60 MG capsule TAKE 1 CAPSULE EVERY DAY   fluticasone (FLONASE) 50 MCG/ACT nasal spray Place 2 sprays into both nostrils daily. 2 sprays (Patient taking differently: Place 2 sprays into both nostrils daily as needed for allergies. )   gabapentin (NEURONTIN) 300 MG capsule Take 1 capsule (300 mg total) by mouth 3 (three) times daily. Emergency Supply while mail order comes in.   glipiZIDE (GLUCOTROL) 10 MG tablet Take 1 tablet (10 mg total) by mouth daily before breakfast.   glucose blood (ACCU-CHEK AVIVA PLUS) test strip Use as instructed   glucose blood test strip True Metrix Glucose Test Strip   HYDROcodone-acetaminophen (NORCO) 10-325 MG tablet Take 1 tablet by mouth 3 (three) times daily.   hydrOXYzine (ATARAX/VISTARIL) 25 MG tablet Take 25 mg by mouth 3 (three) times  daily as needed for anxiety.    metFORMIN (GLUCOPHAGE) 1000 MG tablet Take 1 tablet (1,000 mg total) by mouth 2 (two) times daily with a meal.   metoprolol succinate (TOPROL-XL) 100 MG 24 hr tablet Take 1 tablet (100 mg total) by mouth daily. Take with or immediately following a meal.   Multiple Vitamin (MULTIVITAMIN) tablet Take 1 tablet by mouth daily.   spironolactone (ALDACTONE) 25 MG tablet Take 2 tablets (50 mg total) by mouth daily.   tiZANidine (ZANAFLEX) 4 MG tablet Take 1 tablet (4 mg total) by mouth every 8 (eight) hours as needed for muscle spasms.   TRUEplus Lancets 33G MISC    valsartan-hydrochlorothiazide (DIOVAN-HCT) 320-12.5 MG tablet TAKE 1 TABLET EVERY DAY   No facility-administered encounter medications on file as of 03/03/2020.    Current Diagnosis: Patient Assistance Coordination   Follow-Up:  Pharmacy Review Mailed application for Trulicity patient assistance to patient. Patient to complete and return it and any needed supporting documents to office so that Towanda Malkin, MD can provide prescription for application. Patient may contact me if any questions, phone number provided.

## 2020-03-04 ENCOUNTER — Other Ambulatory Visit: Payer: Self-pay | Admitting: Internal Medicine

## 2020-03-04 DIAGNOSIS — F419 Anxiety disorder, unspecified: Secondary | ICD-10-CM

## 2020-03-04 DIAGNOSIS — F331 Major depressive disorder, recurrent, moderate: Secondary | ICD-10-CM

## 2020-03-08 NOTE — Progress Notes (Signed)
Duplicate documentation

## 2020-03-15 DIAGNOSIS — E875 Hyperkalemia: Secondary | ICD-10-CM | POA: Diagnosis not present

## 2020-03-15 DIAGNOSIS — I10 Essential (primary) hypertension: Secondary | ICD-10-CM | POA: Diagnosis not present

## 2020-03-15 DIAGNOSIS — N200 Calculus of kidney: Secondary | ICD-10-CM | POA: Diagnosis not present

## 2020-03-15 DIAGNOSIS — E1122 Type 2 diabetes mellitus with diabetic chronic kidney disease: Secondary | ICD-10-CM | POA: Diagnosis not present

## 2020-03-15 DIAGNOSIS — N1832 Chronic kidney disease, stage 3b: Secondary | ICD-10-CM | POA: Diagnosis not present

## 2020-03-15 NOTE — Progress Notes (Signed)
Patient ID: Evan Rivas, male    DOB: 12/01/1950, 69 y.o.   MRN: [id number]  PCP: Towanda Malkin, MD  Chief Complaint  Patient presents with  . Follow-up    Subjective:   Evan Rivas is a 69 y.o. male, presents to clinic with CC of the following:  Chief Complaint  Patient presents with  . Follow-up    [name] is a 69 y.o. male Last visit was 12/31/2019   Communication after the lab results obtained at that last visit was as follows;  The lab results from yesterday were reviewed. As expected, the glucose was elevated at 265, and the hemoglobin A1c remains elevated at 9.2. The unfortunate delay in starting the Trulicity product for better control of your blood sugars is a big contributor. In addition, the kidney function has worsened again with a creatinine 1.96. The potassium is slightly elevated as well at 5.7. The blood sugars need to be better controlled, and if the Trulicity cannot be started in the next week, I would recommend starting insulin products to help gain better control. You had been seeing endocrinology in the past, and following up again with them also would likely be helpful as well. You did miss the August appointment with nephrology, and please call them to reschedule that in the very near future as that is needed at this time. The TSH (thyroid screen) was normal. The lipid panel showed the triglycerides still slightly elevated at 336, with a total cholesterol 156, and the LDL cholesterol (lousy type) 82. The LDL cholesterol is just slightly above the goal of less than 70. If we are able to start the Trulicity product in the next week, we should follow-up and check the labs again in approximately 4 weeks. If we are unable to start the Trulicity product in the next week, we need to follow-up again to discuss some next steps in getting the blood sugars better controlled at that time. Follows up today   He hadfollow-up with  urology whonoted he was a candidate for UroLift, and he noted he would like to proceed with surgery. This was done 11/24/2019. He denies any concerns after that procedure and last f/u 12/24/2019 and noted continuing to do well after the urolift procedure  Leg pain  Exact source of his increased hip discomfort was unclear previously, he did see orthopedics, have an MRI, and had an injection of the hip and notes that his symptoms are much improved presently.   DM- He had seen endocrinology in the past for his diabetes, but with his A1c at goal range,returnedto the primary care provider for management. He did follow-up with chronic care management-pharmacy on 10/02/2019 with recommendations to increase Trulicity to 1.5 mg weekly.He noted last visit he had not started that increased dose, and was still waiting to get that medicine with the pharmacist help through the manufacturer. (He noted it is a program through [place] to help due to the cost) On 12/22/2019, this was noted:     Performed chart review for medication changes. Filled out paperwork to apply for patient assistance program for Trulicity. Paperwork mailed to patient with instructions to return to [facility] with signatures and financial statement copies for [facility].   Follow-Up:  Pharmacy Review Mailed application for Trulicity patient assistance to patient. Patient to complete and return it and any needed supporting documents to office so that Towanda Malkin, MD can provide prescription for application. Patient may contact  me if any questions, phone number provided.  Medication regimen-Trulicity-1.5 mg, Did get the higher dose of Trulicity and started 05/39/76, metformin 2000 mg daily, and glipizide 10 mg daily.  Heisalso on an ARB and statin product. Checks sugars at home - fasting, usually about 140 on average, nothing over 200, no low sugars Lab Results  Component Value Date   HGBA1C 9.2 (H)  12/31/2019   HGBA1C 8.9 (H) 09/04/2019   HGBA1C 7.3 (H) 04/07/2019   Lab Results  Component Value Date   MICROALBUR 10.9 04/07/2019   LDLCALC 82 12/31/2019   CREATININE 1.96 (H) 12/31/2019     No increased thirst, no increased urination, no numbness or tingling in ext's  Hyperlipidemia -heresumedatorvastatin6m daily after the 09/16/19 visit Noted to him the importance of this with his diabetes diagnosis, and the goal of keeping his LDL less than 70. Lab Results  Component Value Date   CHOL 156 12/31/2019   HDL 34 (L) 12/31/2019   LDLCALC 82 12/31/2019   TRIG 336 (H) 12/31/2019   CHOLHDL 4.6 12/31/2019    Morbid Obesityweight stable in the recent past, he noted he is trying to lose weight presently and encouraged, slight increase in the very recent past noted which he noted due to temptations on recent vacations and holiday  Wt Readings from Last 3 Encounters:  03/16/20 271 lb 12.8 oz (123.3 kg)  01/28/20 265 lb (120.2 kg)  12/31/19 268 lb 8 oz (121.8 kg)   Going to gym 5 days/week now, limited aerobic activity  CKD stage G3b/A2, GFR 30-44 and albumin creatinine ratio 30-299 mg/g Previously, noted the importance of following kidney function over time, especially with the medications he is taking  Also emphasized the importance of keeping his blood sugars well controlled, also his blood pressure very well controlled, and avoiding chronic NSAIDs to help with the kidney function. He still sees nephrology with last appointment yesterday and the following A/P noted:  Patient is a 69y.o. 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 [name] COVID-19 infection August 2021-did not require hospitalization Back pain/nonsteroidal use  1. Stage 3b chronic kidney disease ([place]  2. Hyperkalemia  3. Essential hypertension  4. Type 2 diabetes mellitus with diabetic chronic kidney disease ([place]  5. Calculus of kidney   # Stage 3b chronic kidney disease ([place] Cause of underlying CKD is multifactorial including diabetes, hypertension, significant history of kidney stones and significant history of using nonsteroidals. Recent worsening of creatinine can be attributed to use of nonsteroidals. Patient's creatinine was 1.44, GFR 50 in June 2021. Most recent labs from December 31, 2019 show increase of creatinine to 1.96, GFR 34. Potassium also mildly elevated at 5.7. Losartan and hydrochlorothiazide are on hold Repeat renal panel today - not yet done    #Hyperkalemia Continue reduced dose of spironolactone Labs are being drawn today  #Hypertension Blood pressure BP: 142/83  Volume control is acceptable Continue to monitor  #Diabetes type 2 with CKD Hemoglobin A1c is poorly controlled at 9.2% from December 31, 2019 Patient to work with PCP regarding improving glycemic control Currently managed with Trulicity, glipizide, Metformin  # Bilateral nonobstructive kidney stones Noted on CT in 2020 Patient required cystoscopy and stone retrieval in 2016 Stone analysis-calcium oxalate stone Advised patient  to keep the urine dilute and try to drink at least 2 L of water daily  Return in about 4 months (around 07/14/2020).        Lab Results  Component Value Date   CREATININE 1.96 (H) 12/31/2019   BUN 46 (H) 12/31/2019   NA 140 12/31/2019   K 5.7 (H) 12/31/2019   CL 106 12/31/2019   CO2 22 12/31/2019    Hyperaldosteronism:He saw endocrinology lastinJuly, 2020 with the plan as noted; Plan * While he has hyperaldosteronism, this is felt not to be due to an adrenal adenoma. Rather likely has  bilateral adrenal hyperplasia and this surgical intervention not warranted. Continue spironolactone. Encouraged follow up with Cardiology for hypertension mgmt with consideration of titrating spironolactone to ideal hypertension control. * Diabetes is well controlled. Continue current regimen. * Discussed weight loss. He is interested in weight loss and was encouraged to work on reducing portions and regularly exercising.  * He may follow up with me as needed.   Essential hypertension Med regimen -Toprol 100 mg daily, spironolactone 25 mg daily,valsartan-hydrochlorothiazide 320-12.5 daily Taking meds regularly BP Readings from Last 3 Encounters:  03/16/20 140/90  01/28/20 132/82  12/31/19 124/82    Continues to check blood pressures at home to monitor, have been slightly more labile, readings predominantly less than 140/90 on checks. Goal blood pressure less than 130/80. Continues to see nephrology (last visit 05/2019) and also gets cardiology input, with A/P from cardiology visit 01/28/20 as follows:  ASSESSMENT AND PLAN: Hypertension and hyperaldosteronism: Blood pressure remains mildly elevated today at 132/80 (goal less than 130/80).  Given recent de-escalation of spironolactone, I am concerned that blood pressure will increase further in the setting of hyperaldosteronism.  We have therefore agreed to increase metoprolol to 100 mg daily.  I will defer additional medication changes today.  Ongoing follow-up and management of renal insufficiency will be deferred to nephrology.  I encouraged Mr. Broady to continue limiting his sodium intake and to work on weight loss through diet and exercise.  Hyperlipidemia: LDL reasonable at 82 on last check.  Continue atorvastatin 40 mg daily.  Continue to work on lifestyle modifications to help bring down triglycerides.  Type 2 diabetes mellitus with chronic kidney disease stage IIIb: Glucose suboptimally controlled with recent hemoglobin A1c  of 9.2%.  I encouraged Mr. Frost to work on lifestyle modifications as well as to follow closely with his PCP for ongoing management.  Follow-up: Return to clinic in 6 months.  Chronic Pain Managementhx -DDD/chronic back pain:He has never had any spinal surgeries; Dr. Dot Been management)did not do any procedures with [name] as Mr. Froman declined.Dr. [name] requested that the patient's narcotic pain medication Rx be transferred to our clinic to help with financial burden of specialty visits for the patient, therefore Mr. Rosencrans has been receiving his pain management medication fromour clinic instead.Per last note by [name], at this time, we will maintain Rx's with our clinic, hadcontrolled substance contract renewallast visit in March, 2021.He did do another contract with me as the provider in the recent past. -Medications: TakingTizanidine,Norco - Taking 10-327m TID, Gabapentin 3021mTID.HasNarcan auto-injector at home and knows how to use it. Hischronicpainwas noted across his lumbar spine, occasionally goes down to right outer hip.He also has some bilateral knee pain - s/p bilateral knee replacement for OA in 2018 Controlled Substance Database checked and no suspicious findings.Pt has been consistently very compliant.      Patient Active Problem List   Diagnosis Date Noted  .  Hip pain, right 12/31/2019  . Controlled substance agreement signed 09/30/2019  . Acute renal failure with acute renal cortical necrosis superimposed on stage 3a chronic kidney disease ([place]) 09/07/2019  . Sepsis ([place]) 09/05/2019  . Emphysematous pyelitis 09/04/2019  . Pyelonephritis 09/04/2019  . CKD stage G3b/A2, GFR 30-44 and albumin creatinine ratio 30-299 mg/g (HCC) 04/09/2019  . Hyperaldosteronism ([place]) 06/24/2018  . Diastolic dysfunction 45/06/8880  . OSA (obstructive sleep apnea) 06/24/2018  . Excessive daytime sleepiness 06/24/2018  . Aortic  atherosclerosis ([place]) 06/17/2018  . Leg edema 12/18/2017  . BPH without urinary obstruction 06/03/2015  . Depression 03/05/2015  . Hyperlipidemia 09/03/2014  . Major depressive disorder, recurrent episode, moderate ([place]) 09/03/2014  . Chronic low back pain 09/03/2014  . Arthralgia of both knees 09/03/2014  . Type 2 diabetes mellitus without complication ([place]) 80/06/4915  . Allergic rhinitis 07/04/2014  . Anxiety 07/04/2014  . Continuous opioid dependence ([place]) 07/04/2014  . Diabetes mellitus type 2 in obese ([place]) 07/04/2014  . DDD (degenerative disc disease), lumbar 07/04/2014  . Gout 07/04/2014  . Essential hypertension 07/04/2014  . Flu vaccine need 07/04/2014  . Morbid obesity ([place]) 07/04/2014  . OA (osteoarthritis) of knee 07/04/2014  . Neuralgia neuritis, sciatic nerve 07/04/2014  . Arthralgia of multiple joints 07/04/2014  . H/O malignant neoplasm of skin 12/24/2013      Current Outpatient Medications:  .  aspirin EC 81 MG tablet, Take 81 mg by mouth daily. Swallow whole., Disp: , Rfl:  .  atorvastatin (LIPITOR) 40 MG tablet, Take 1 tablet (40 mg total) by mouth at bedtime., Disp: 90 tablet, Rfl: 3 .  blood glucose meter kit and supplies, Dispense based on patient and insurance preference. Use up to four times daily as directed. (FOR ICD-10 E10.9, E11.9)., Disp: 1 each, Rfl: 0 .  Dulaglutide (TRULICITY) 1.5 HX/5.0VW SOPN, Inject 1.5 mg into the skin once a week., Disp: 6 mL, Rfl: 3 .  DULoxetine (CYMBALTA) 60 MG capsule, TAKE 1 CAPSULE EVERY DAY, Disp: 90 capsule, Rfl: 0 .  fluticasone (FLONASE) 50 MCG/ACT nasal spray, Place 2 sprays into both nostrils daily. 2 sprays (Patient taking differently: Place 2 sprays into both nostrils daily as needed for allergies.), Disp: 16 g, Rfl: 6 .  gabapentin (NEURONTIN) 300 MG capsule, Take 1 capsule (300 mg total) by mouth 3 (three) times daily. Emergency Supply while mail order comes in., Disp: 270 capsule, Rfl: 3 .  glipiZIDE (GLUCOTROL)  10 MG tablet, TAKE 1 TABLET (10 MG TOTAL) BY MOUTH DAILY BEFORE BREAKFAST., Disp: 90 tablet, Rfl: 1 .  hydrOXYzine (ATARAX/VISTARIL) 25 MG tablet, Take 25 mg by mouth 3 (three) times daily as needed for anxiety. , Disp: , Rfl:  .  metFORMIN (GLUCOPHAGE) 1000 MG tablet, Take 1 tablet (1,000 mg total) by mouth 2 (two) times daily with a meal., Disp: 180 tablet, Rfl: 1 .  metoprolol succinate (TOPROL-XL) 100 MG 24 hr tablet, Take 1 tablet (100 mg total) by mouth daily. Take with or immediately following a meal., Disp: 90 tablet, Rfl: 2 .  Multiple Vitamin (MULTIVITAMIN) tablet, Take 1 tablet by mouth daily., Disp: , Rfl:  .  spironolactone (ALDACTONE) 25 MG tablet, Take 2 tablets (50 mg total) by mouth daily., Disp: 180 tablet, Rfl: 3 .  tiZANidine (ZANAFLEX) 4 MG tablet, Take 1 tablet (4 mg total) by mouth every 8 (eight) hours as needed for muscle spasms., Disp: 60 tablet, Rfl: 3 .  TRUEplus Lancets 33G MISC, , Disp: , Rfl:  .  valsartan-hydrochlorothiazide (DIOVAN-HCT) 320-12.5 MG tablet, TAKE 1 TABLET EVERY DAY, Disp: 90 tablet, Rfl: 1 .  glucose blood (ACCU-CHEK AVIVA PLUS) test strip, Use as instructed, Disp: 100 each, Rfl: 12 .  [START ON 04/01/2020] HYDROcodone-acetaminophen (NORCO) 10-325 MG tablet, Take 1 tablet by mouth 3 (three) times daily., Disp: 90 tablet, Rfl: 0 .  [START ON 05/02/2020] HYDROcodone-acetaminophen (NORCO) 10-325 MG tablet, Take 1 tablet by mouth 3 (three) times daily., Disp: 90 tablet, Rfl: 0 .  [START ON 06/02/2020] HYDROcodone-acetaminophen (NORCO) 10-325 MG tablet, Take 1 tablet by mouth 3 (three) times daily., Disp: 90 tablet, Rfl: 0   Allergies  Allergen Reactions  . Celebrex [Celecoxib]     GI issues  . Tetanus Toxoids Other (See Comments)    unkown  . Tetanus-Diphtheria Toxoids Td     Unknown     Past Surgical History:  Procedure Laterality Date  . ARTHROSCOPIC REPAIR ACL Left   . CYSTOSCOPY W/ URETERAL STENT PLACEMENT Right 09/07/2014   Procedure:  CYSTOSCOPY WITH STENT REPLACEMENT;  Surgeon: Hollice Espy, MD;  Location: [facility];  Service: Urology;  Laterality: Right;  . CYSTOSCOPY WITH INSERTION OF UROLIFT N/A 11/24/2019   Procedure: CYSTOSCOPY WITH INSERTION OF UROLIFT;  Surgeon: Hollice Espy, MD;  Location: [facility];  Service: Urology;  Laterality: N/A;  . CYSTOSCOPY/URETEROSCOPY/HOLMIUM LASER/STENT PLACEMENT Right 08/17/2014   Procedure: CYSTOSCOPY/URETEROSCOPY//STENT PLACEMENT/ attempt of lithotripsy;  Surgeon: Hollice Espy, MD;  Location: [facility];  Service: Urology;  Laterality: Right;  . HERNIA REPAIR    . JOINT REPLACEMENT    . MOHS SURGERY     lip  . TOTAL KNEE ARTHROPLASTY Left 04/24/2016   Procedure: LEFT TOTAL KNEE ARTHROPLASTY;  Surgeon: Gaynelle Arabian, MD;  Location: [facility];  Service: Orthopedics;  Laterality: Left;  Adductor Block  . TOTAL KNEE ARTHROPLASTY Right 10/23/2016   Procedure: RIGHT TOTAL KNEE ARTHROPLASTY;  Surgeon: Gaynelle Arabian, MD;  Location: [facility];  Service: Orthopedics;  Laterality: Right;  . URETEROSCOPY WITH HOLMIUM LASER LITHOTRIPSY Right 09/07/2014   Procedure: URETEROSCOPY WITH HOLMIUM LASER LITHOTRIPSY;  Surgeon: Hollice Espy, MD;  Location: [facility];  Service: Urology;  Laterality: Right;  [name] VASECTOMY       Family History  Problem Relation Age of Onset  . Hypertension Mother   . Cancer Mother   . Hyperlipidemia Mother   . Arthritis Mother   . Stroke Father   . Heart disease Father   . Obesity Father   . Depression Sister   . Hypertension Sister   . Hyperlipidemia Sister   . Learning disabilities Sister   . Obesity Sister   . Kidney disease Maternal Grandmother      Social History   Tobacco Use  . Smoking status: Never Smoker  . Smokeless tobacco: Never Used  Substance Use Topics  . Alcohol use: No    With staff assistance, above reviewed with the patient today.  ROS: As per HPI, otherwise no specific complaints on a limited and focused system review   No results found  for this or any previous visit (from the past 72 hour(s)).   PHQ2/9: Depression screen [facility] 2/9 01/15/2020 12/31/2019 12/15/2019 09/30/2019 09/29/2019  Decreased Interest 1 0 0 0 0  Down, Depressed, Hopeless 1 0 0 0 0  PHQ - 2 Score 2 0 0 0 0  Altered sleeping 0 - - 0 0  Tired, decreased energy 0 - - 0 0  Change in appetite 0 - - 0 0  Feeling bad or failure about  yourself  0 - - 0 0  Trouble concentrating 0 - - 0 0  Moving slowly or fidgety/restless 0 - - 0 0  Suicidal thoughts 0 - - 0 0  PHQ-9 Score 2 - - 0 0  Difficult doing work/chores - - - Not difficult at all -  Some recent data might be hidden   PHQ-2 's today are 0's  Fall Risk: Fall Risk  03/16/2020 01/15/2020 12/31/2019 12/15/2019 09/30/2019  Falls in the past year? 0 0 0 0 0  Number falls in past yr: 0 0 0 0 0  Injury with Fall? 0 0 0 0 0  Risk for fall due to : - No Fall Risks - - -  Follow up - Falls prevention discussed Falls evaluation completed Falls evaluation completed -      Objective:   Vitals:   03/16/20 1055  BP: 140/90  Pulse: (!) 109  Resp: 16  Temp: 98 F (36.7 C)  TempSrc: Oral  SpO2: 96%  Weight: 271 lb 12.8 oz (123.3 kg)  Height: '6\' 3"'  (1.905 m)    Body mass index is 33.97 kg/m.   Recheck blood pressure was 132/82 by myself on the left with a large adult cuff Physical Exam   NAD, masked, pleasant HEENT - [place]/AT, sclera anicteric, PERRL Neck - supple, no adenopathy, carotids 2+ and = without bruits bilat Car -RRR without murmur/gallop/rub,  pulse approximately 92 on my exam Pulm -clear to auscultation Abd - soft, obese, nontender diffusely Back - no CVA tenderness Ext -trace lower extremity edema, with some mild indentation at the sock line noted bilaterally, not marked  Neuro/psychiatric - affect was not flat, appropriate with conversation Alert              Grossly nonfocal with sensation intact to light touch in the distal extremities, and strength adequate in the  extremities including with grip strength, Speech normal    Results for orders placed or performed in visit on 12/31/19  COMPLETE METABOLIC PANEL WITH GFR  Result Value Ref Range   Glucose, Bld 265 (H) 65 - 99 mg/dL   BUN 46 (H) 7 - 25 mg/dL   Creat 1.96 (H) 0.70 - 1.25 mg/dL   GFR, Est Non African American 34 (L) > OR = 60 mL/min/1.52m   GFR, Est African American 39 (L) > OR = 60 mL/min/1.75m  BUN/Creatinine Ratio 23 (H) 6 - 22 (calc)   Sodium 140 135 - 146 mmol/L   Potassium 5.7 (H) 3.5 - 5.3 mmol/L   Chloride 106 98 - 110 mmol/L   CO2 22 20 - 32 mmol/L   Calcium 10.0 8.6 - 10.3 mg/dL   Total Protein 6.9 6.1 - 8.1 g/dL   Albumin 4.3 3.6 - 5.1 g/dL   Globulin 2.6 1.9 - 3.7 g/dL (calc)   AG Ratio 1.7 1.0 - 2.5 (calc)   Total Bilirubin 0.3 0.2 - 1.2 mg/dL   Alkaline phosphatase (APISO) 71 35 - 144 U/L   AST 14 10 - 35 U/L   ALT 24 9 - 46 U/L  TSH  Result Value Ref Range   TSH 3.98 0.40 - 4.50 mIU/L  Lipid panel  Result Value Ref Range   Cholesterol 156 <200 mg/dL   HDL 34 (L) > OR = 40 mg/dL   Triglycerides 336 (H) <150 mg/dL   LDL Cholesterol (Calc) 82 mg/dL (calc)   Total CHOL/HDL Ratio 4.6 <5.0 (calc)   Non-HDL Cholesterol (Calc) 122 <130 mg/dL (calc)  Hemoglobin A1c  Result Value Ref Range   Hgb A1c MFr Bld 9.2 (H) <5.7 % of total Hgb   Mean Plasma Glucose 217 (calc)   eAG (mmol/L) 12.0 (calc)   Last labs reviewed.    Assessment & Plan:     1. Diabetes mellitus type 2 in obese [facility]) Discussed the importance of getting better control of his blood sugars, and and was able to get on the higher dose of Trulicity, also continues the Metformin product and a sulfonylurea. We will recheck labs today,  Also continue with checking blood sugars at home, and he has been keeping a nice log in the notebook which he had with him today and is helpful. Continue the Trulicity, Metformin and sulfonylurea presently - Hemoglobin A1c Also will check a urine  microalbumin  2. Mixed hyperlipidemia Continue the statin product, and emphasized today the importance of continuing this medication, especially with his diabetes. Will not recheck a lipid panel today but will again in the future.  3. Morbid obesity ([place]) The importance of a healthy weight maintenance was discussed today. Dietary modifications in addition to exercise including an aerobic component encouraged to help, with exercise in the water as one option encouraged as he noted some limitations with exercise due to chronic pain and orthopedic issues.  4. CKD stage G3b/A2, GFR 30-44 and albumin creatinine ratio 30-299 mg/g (HCC) We will check labs today, as was not done with nephrology visit yesterday Recommended continuing to follow-up with nephrology,   5. Essential hypertension Do feel his blood pressure remains controlled presently Continue his current regimen. Also continue with nephrology and cardiology input   6. Hip pain, right Continue with orthopedic follow-up, has improved significantly after the injection by orthopedics.  7. Other chronic pain 8. Continuous opioid dependence ([place]) 9. Controlled substance agreement signed Refill of the Norco product today, and he does have a contract and has been very compliant with this. We will continue to do with monthly refills for a 53-monthperiod, and continue to closely monitor his use.  10. Bilateral lower extremity edema Has remained controlled in the recent past with his current regimen. Continue to have cardiology input as well noted  11. DDD (degenerative disc disease), lumbar Is a reason for his chronic pain management need    follow-up again in 3 months time, sooner as needed He is aware that his follow-up will be with a different provider, as I will be leaving this practice prior to that planned follow-up.       CTowanda Malkin MD 03/16/20 12:50 PM

## 2020-03-16 ENCOUNTER — Other Ambulatory Visit: Payer: Self-pay

## 2020-03-16 ENCOUNTER — Encounter: Payer: Self-pay | Admitting: Internal Medicine

## 2020-03-16 ENCOUNTER — Ambulatory Visit: Payer: Medicare PPO | Admitting: Internal Medicine

## 2020-03-16 VITALS — BP 140/90 | HR 109 | Temp 98.0°F | Resp 16 | Ht 75.0 in | Wt 271.8 lb

## 2020-03-16 DIAGNOSIS — M25551 Pain in right hip: Secondary | ICD-10-CM | POA: Diagnosis not present

## 2020-03-16 DIAGNOSIS — Z79899 Other long term (current) drug therapy: Secondary | ICD-10-CM

## 2020-03-16 DIAGNOSIS — E782 Mixed hyperlipidemia: Secondary | ICD-10-CM

## 2020-03-16 DIAGNOSIS — M5136 Other intervertebral disc degeneration, lumbar region: Secondary | ICD-10-CM

## 2020-03-16 DIAGNOSIS — M51369 Other intervertebral disc degeneration, lumbar region without mention of lumbar back pain or lower extremity pain: Secondary | ICD-10-CM

## 2020-03-16 DIAGNOSIS — I10 Essential (primary) hypertension: Secondary | ICD-10-CM | POA: Diagnosis not present

## 2020-03-16 DIAGNOSIS — N1832 Chronic kidney disease, stage 3b: Secondary | ICD-10-CM | POA: Diagnosis not present

## 2020-03-16 DIAGNOSIS — F1120 Opioid dependence, uncomplicated: Secondary | ICD-10-CM

## 2020-03-16 DIAGNOSIS — G8929 Other chronic pain: Secondary | ICD-10-CM | POA: Diagnosis not present

## 2020-03-16 DIAGNOSIS — E119 Type 2 diabetes mellitus without complications: Secondary | ICD-10-CM

## 2020-03-16 DIAGNOSIS — R600 Localized edema: Secondary | ICD-10-CM

## 2020-03-16 MED ORDER — HYDROCODONE-ACETAMINOPHEN 10-325 MG PO TABS: 1.0000 | ORAL_TABLET | Freq: Three times a day (TID) | ORAL | 0 refills | Status: AC

## 2020-03-16 MED ORDER — HYDROCODONE-ACETAMINOPHEN 10-325 MG PO TABS: 1.0000 | ORAL_TABLET | Freq: Three times a day (TID) | ORAL | 0 refills | Status: DC

## 2020-03-16 NOTE — Patient Instructions (Signed)
Diabetes Mellitus and Nutrition, Adult When you have diabetes (diabetes mellitus), it is very important to have healthy eating habits because your blood sugar (glucose) levels are greatly affected by what you eat and drink. Eating healthy foods in the appropriate amounts, at about the same times every day, can help you:  Control your blood glucose.  Lower your risk of heart disease.  Improve your blood pressure.  Reach or maintain a healthy weight. Every person with diabetes is different, and each person has different needs for a meal plan. Your health care provider may recommend that you work with a diet and nutrition specialist (dietitian) to make a meal plan that is best for you. Your meal plan may vary depending on factors such as:  The calories you need.  The medicines you take.  Your weight.  Your blood glucose, blood pressure, and cholesterol levels.  Your activity level.  Other health conditions you have, such as heart or kidney disease. How do carbohydrates affect me? Carbohydrates, also called carbs, affect your blood glucose level more than any other type of food. Eating carbs naturally raises the amount of glucose in your blood. Carb counting is a method for keeping track of how many carbs you eat. Counting carbs is important to keep your blood glucose at a healthy level, especially if you use insulin or take certain oral diabetes medicines. It is important to know how many carbs you can safely have in each meal. This is different for every person. Your dietitian can help you calculate how many carbs you should have at each meal and for each snack. Foods that contain carbs include:  Bread, cereal, rice, pasta, and crackers.  Potatoes and corn.  Peas, beans, and lentils.  Milk and yogurt.  Fruit and juice.  Desserts, such as cakes, cookies, ice cream, and candy. How does alcohol affect me? Alcohol can cause a sudden decrease in blood glucose (hypoglycemia),  especially if you use insulin or take certain oral diabetes medicines. Hypoglycemia can be a life-threatening condition. Symptoms of hypoglycemia (sleepiness, dizziness, and confusion) are similar to symptoms of having too much alcohol. If your health care provider says that alcohol is safe for you, follow these guidelines:  Limit alcohol intake to no more than 1 drink per day for nonpregnant women and 2 drinks per day for men. One drink equals 12 oz of beer, 5 oz of wine, or 1 oz of hard liquor.  Do not drink on an empty stomach.  Keep yourself hydrated with water, diet soda, or unsweetened iced tea.  Keep in mind that regular soda, juice, and other mixers may contain a lot of sugar and must be counted as carbs. What are tips for following this plan?  Reading food labels  Start by checking the serving size on the "Nutrition Facts" label of packaged foods and drinks. The amount of calories, carbs, fats, and other nutrients listed on the label is based on one serving of the item. Many items contain more than one serving per package.  Check the total grams (g) of carbs in one serving. You can calculate the number of servings of carbs in one serving by dividing the total carbs by 15. For example, if a food has 30 g of total carbs, it would be equal to 2 servings of carbs.  Check the number of grams (g) of saturated and trans fats in one serving. Choose foods that have low or no amount of these fats.  Check the number of   milligrams (mg) of salt (sodium) in one serving. Most people should limit total sodium intake to less than 2,300 mg per day.  Always check the nutrition information of foods labeled as "low-fat" or "nonfat". These foods may be higher in added sugar or refined carbs and should be avoided.  Talk to your dietitian to identify your daily goals for nutrients listed on the label. Shopping  Avoid buying canned, premade, or processed foods. These foods tend to be high in fat, sodium,  and added sugar.  Shop around the outside edge of the grocery store. This includes fresh fruits and vegetables, bulk grains, fresh meats, and fresh dairy. Cooking  Use low-heat cooking methods, such as baking, instead of high-heat cooking methods like deep frying.  Cook using healthy oils, such as olive, canola, or sunflower oil.  Avoid cooking with butter, cream, or high-fat meats. Meal planning  Eat meals and snacks regularly, preferably at the same times every day. Avoid going long periods of time without eating.  Eat foods high in fiber, such as fresh fruits, vegetables, beans, and whole grains. Talk to your dietitian about how many servings of carbs you can eat at each meal.  Eat 4-6 ounces (oz) of lean protein each day, such as lean meat, chicken, fish, eggs, or tofu. One oz of lean protein is equal to: ? 1 oz of meat, chicken, or fish. ? 1 egg. ?  cup of tofu.  Eat some foods each day that contain healthy fats, such as avocado, nuts, seeds, and fish. Lifestyle  Check your blood glucose regularly.  Exercise regularly as told by your health care provider. This may include: ? 150 minutes of moderate-intensity or vigorous-intensity exercise each week. This could be brisk walking, biking, or water aerobics. ? Stretching and doing strength exercises, such as yoga or weightlifting, at least 2 times a week.  Take medicines as told by your health care provider.  Do not use any products that contain nicotine or tobacco, such as cigarettes and e-cigarettes. If you need help quitting, ask your health care provider.  Work with a counselor or diabetes educator to identify strategies to manage stress and any emotional and social challenges. Questions to ask a health care provider  Do I need to meet with a diabetes educator?  Do I need to meet with a dietitian?  What number can I call if I have questions?  When are the best times to check my blood glucose? Where to find more  information:  American Diabetes Association: diabetes.org  Academy of Nutrition and Dietetics: www.eatright.org  National Institute of Diabetes and Digestive and Kidney Diseases (NIH): www.niddk.nih.gov Summary  A healthy meal plan will help you control your blood glucose and maintain a healthy lifestyle.  Working with a diet and nutrition specialist (dietitian) can help you make a meal plan that is best for you.  Keep in mind that carbohydrates (carbs) and alcohol have immediate effects on your blood glucose levels. It is important to count carbs and to use alcohol carefully. This information is not intended to replace advice given to you by your health care provider. Make sure you discuss any questions you have with your health care provider. Document Revised: 03/02/2017 Document Reviewed: 04/24/2016 Elsevier Patient Education  2020 [employer].   Type 2 Diabetes Mellitus, Self Care, Adult When you have type 2 diabetes (type 2 diabetes mellitus), you must make sure your blood sugar (glucose) stays in a healthy range. You can do this with:  Nutrition.    Exercise.  Lifestyle changes.  Medicines or insulin, if needed.  Support from your doctors and others. How to stay aware of blood sugar   Check your blood sugar level every day, as often as told.  Have your A1c (hemoglobin A1c) level checked two or more times a year. Have it checked more often if your doctor tells you to. Your doctor will set personal treatment goals for you. Generally, you should have these blood sugar levels:  Before meals (preprandial): 80-130 mg/dL (4.4-7.2 mmol/L).  After meals (postprandial): below 180 mg/dL (10 mmol/L).  A1c level: less than 7%. How to manage high and low blood sugar Signs of high blood sugar High blood sugar is called hyperglycemia. Know the signs of high blood sugar. Signs may include:  Feeling: ? Thirsty. ? Hungry. ? Very tired.  Needing to pee (urinate) more than  usual.  Blurry vision. Signs of low blood sugar Low blood sugar is called hypoglycemia. This is when blood sugar is at or below 70 mg/dL (3.9 mmol/L). Signs may include:  Feeling: ? Hungry. ? Worried or nervous (anxious). ? Sweaty and clammy. ? Confused. ? Dizzy. ? Sleepy. ? Sick to your stomach (nauseous).  Having: ? A fast heartbeat. ? A headache. ? A change in your vision. ? Jerky movements that you cannot control (seizure). ? Tingling or no feeling (numbness) around your mouth, lips, or tongue.  Having trouble with: ? Moving (coordination). ? Sleeping. ? Passing out (fainting). ? Getting upset easily (irritability). Treating low blood sugar To treat low blood sugar, eat or drink something sugary right away. If you can think clearly and swallow safely, follow the 15:15 rule:  Take 15 grams of a fast-acting carb (carbohydrate). Talk with your doctor about how much you should take.  Some fast-acting carbs are: ? Sugar tablets (glucose pills). Take 3-4 pills. ? 6-8 pieces of hard candy. ? 4-6 oz (120-150 mL) of fruit juice. ? 4-6 oz (120-150 mL) of regular (not diet) soda. ? 1 Tbsp (15 mL) honey or sugar.  Check your blood sugar 15 minutes after you take the carb.  If your blood sugar is still at or below 70 mg/dL (3.9 mmol/L), take 15 grams of a carb again.  If your blood sugar does not go above 70 mg/dL (3.9 mmol/L) after 3 tries, get help right away.  After your blood sugar goes back to normal, eat a meal or a snack within 1 hour. Treating very low blood sugar If your blood sugar is at or below 54 mg/dL (3 mmol/L), you have very low blood sugar (severe hypoglycemia). This is an emergency. Do not wait to see if the symptoms will go away. Get medical help right away. Call your local emergency services (911 in the [place] If you have very low blood sugar and you cannot eat or drink, you may need a glucagon shot (injection). A family member or friend should learn how to  check your blood sugar and how to give you a glucagon shot. Ask your doctor if you need to have a glucagon shot kit at home. Follow these instructions at home: Medicine  Take insulin and diabetes medicines as told.  If your doctor says you should take more or less insulin and medicines, do this exactly as told.  Do not run out of insulin or medicines. Having diabetes can raise your risk for other long-term conditions. These include heart disease and kidney disease. Your doctor may prescribe medicines to help you not have these   problems. Food   Make healthy food choices. These include: ? Chicken, fish, egg whites, and beans. ? Oats, whole wheat, bulgur, brown rice, quinoa, and millet. ? Fresh fruits and vegetables. ? Low-fat dairy products. ? Nuts, avocado, olive oil, and canola oil.  Meet with a food specialist (dietitian). He or she can help you make an eating plan that is right for you.  Follow instructions from your doctor about what you cannot eat or drink.  Drink enough fluid to keep your pee (urine) pale yellow.  Keep track of carbs that you eat. Do this by reading food labels and learning food serving sizes.  Follow your sick day plan when you cannot eat or drink normally. Make this plan with your doctor so it is ready to use. Activity  Exercise 3 or more times a week.  Do not go more than 2 days without exercising.  Talk with your doctor before you start a new exercise. Your doctor may need to tell you to change: ? How much insulin or medicines you take. ? How much food you eat. Lifestyle  Do not use any tobacco products. These include cigarettes, chewing tobacco, and e-cigarettes. If you need help quitting, ask your doctor.  Ask your doctor how much alcohol is safe for you.  Learn to deal with stress. If you need help with this, ask your doctor. Body care   Stay up to date with your shots (immunizations).  Have your eyes and feet checked by a doctor as often  as told.  Check your skin and feet every day. Check for cuts, bruises, redness, blisters, or sores.  Brush your teeth and gums two times a day. Floss one or more times a day.  Go to the dentist one or more times every 6 months.  Stay at a healthy weight. General instructions  Take over-the-counter and prescription medicines only as told by your doctor.  Share your diabetes care plan with: ? Your work or school. ? People you live with.  Carry a card or wear jewelry that says you have diabetes.  Keep all follow-up visits as told by your doctor. This is important. Questions to ask your doctor  Do I need to meet with a diabetes educator?  Where can I find a support group for people with diabetes? Where to find more information To learn more about diabetes, visit:  American Diabetes Association: www.diabetes.org  American Association of Diabetes Educators: www.diabeteseducator.org Summary  When you have type 2 diabetes, you must make sure your blood sugar (glucose) stays in a healthy range.  Check your blood sugar every day, as often as told.  Having diabetes can raise your risk for other conditions. Your doctor may prescribe medicines to help you not have these problems.  Keep all follow-up visits as told by your doctor. This is important. This information is not intended to replace advice given to you by your health care provider. Make sure you discuss any questions you have with your health care provider. Document Revised: 09/10/2017 Document Reviewed: 04/23/2015 Elsevier Patient Education  2020 Elsevier Inc.   

## 2020-03-17 LAB — BASIC METABOLIC PANEL WITH GFR
BUN/Creatinine Ratio: 25 (calc) — ABNORMAL HIGH (ref 6–22)
BUN: 39 mg/dL — ABNORMAL HIGH (ref 7–25)
CO2: 24 mmol/L (ref 20–32)
Calcium: 9.6 mg/dL (ref 8.6–10.3)
Chloride: 106 mmol/L (ref 98–110)
Creat: 1.58 mg/dL — ABNORMAL HIGH (ref 0.70–1.25)
GFR, Est African American: 51 mL/min/{1.73_m2} — ABNORMAL LOW (ref >=60)
GFR, Est Non African American: 44 mL/min/{1.73_m2} — ABNORMAL LOW (ref >=60)
Glucose, Bld: 163 mg/dL — ABNORMAL HIGH (ref 65–99)
Potassium: 5.6 mmol/L — ABNORMAL HIGH (ref 3.5–5.3)
Sodium: 139 mmol/L (ref 135–146)

## 2020-03-17 LAB — MICROALBUMIN / CREATININE URINE RATIO
Creatinine, Urine: 113 mg/dL (ref 20–320)
Microalb Creat Ratio: 3 mcg/mg creat (ref <30)
Microalb, Ur: 0.3 mg/dL

## 2020-03-17 LAB — HEMOGLOBIN A1C
Hgb A1c MFr Bld: 7.6 % of total Hgb — ABNORMAL HIGH (ref <5.7)
Mean Plasma Glucose: 171 mg/dL
eAG (mmol/L): 9.5 mmol/L

## 2020-04-14 DIAGNOSIS — M25551 Pain in right hip: Secondary | ICD-10-CM | POA: Diagnosis not present

## 2020-04-22 DIAGNOSIS — M1611 Unilateral primary osteoarthritis, right hip: Secondary | ICD-10-CM | POA: Diagnosis not present

## 2020-04-28 ENCOUNTER — Telehealth: Payer: Self-pay | Admitting: Internal Medicine

## 2020-04-28 NOTE — Telephone Encounter (Signed)
   Primary Cardiologist: Nelva Bush, MD  Chart reviewed and patient contacted by phone today as part of pre-operative protocol coverage. Given past medical history and time since last visit, based on ACC/AHA guidelines, Evan Rivas would be at acceptable risk for the planned procedure without further cardiovascular testing.   OK to hold aspirin 3-5 days pre op if needed.  The patient was advised that if he develops new symptoms prior to surgery to contact our office to arrange for a follow-up visit, and he verbalized understanding.  I will route this recommendation to the requesting party via Epic fax function and remove from pre-op pool.  Please call with questions.  Kerin Ransom, PA-C 04/28/2020, 11:32 AM

## 2020-04-28 NOTE — Telephone Encounter (Signed)
   [facility] Pre-operative Risk Assessment    HEARTCARE STAFF: - Please ensure there is not already an duplicate clearance open for this procedure. - Under Visit Info/Reason for Call, type in Other and utilize the format Clearance MM/DD/YY or Clearance TBD. Do not use dashes or single digits. - If request is for dental extraction, please clarify the # of teeth to be extracted.  Request for surgical clearance:  1. What type of surgery is being performed? Anterior right total hip arthoplasty   2. When is this surgery scheduled? TBD  3. What type of clearance is required (medical clearance vs. Pharmacy clearance to hold med vs. Both)? pharm  4. Are there any medications that need to be held prior to surgery and how long? Any anticoagulants   5. Practice name and name of physician performing surgery? [facility] Dr. Synthia Innocent  6. What is the office phone number? [phone]   7.   What is the office fax number? [phone]  8.   Anesthesia type (None, local, MAC, general) ? choice   Marykay Lex 04/28/2020, 11:24 AM  _________________________________________________________________   (provider comments below)

## 2020-04-30 NOTE — Progress Notes (Signed)
Name: Evan Rivas   MRN: [id number]    DOB: [birth year]   Date:05/03/2020       Progress Note  Subjective  Chief Complaint  Surgery Clearance- Right Hip  HPI  Right hip OA: Dr Maureen Ralphs at [facility] in [place]. He had both knee replaced in the past, never had complications from surgeries in the past. He has tolerated anesthesia. He states pain is intense pain. He takes hydrocodone three times daily chronically - he has low back pain and knee pain. He states the pain on right hip is so intense that pain has not bene under control with medication. He states pain affects his ability to walk, it is sharp and intense and radiates down to left anterior lower leg. Improves when laying down, pain is worse with movement of hip or walking . He is on a waiting list to have hip replaced , currently scheduled for March 2022. Labs have been ordered but not due yet    DMII: last A1C was last month and it was 7.6 %, he states usually fasting glucose has been in the 130's, however this morning it went up to 174 due to splurging on chips and candy bars last night. Explained importance of keeping A1C down to decrease risk os post-op complications   HTN: bp is usually at goal, today is elevated likely from increase in pain. He sees Dr. [name]. Denies sob or decrease in exercise tolerance except for pain . He has hypoaldosteronism and is on Spironolactone   Continues use of opioids: explained that since he has a pain contract with our clinic he needs to let Ortho know about notifying us if need to adjust dose in the post-op period. He will be admitted and may be okay to go home with TID hydrocodone. Also explained that he will need to discuss with next provider if she will feel comfortable managing his chronic pain medications or if he will need to go back to pain clinic.     Patient Active Problem List   Diagnosis Date Noted  . Osteoarthritis of right hip 01/07/2020  . Hip pain, right 12/31/2019  .  Trochanteric bursitis of right hip 11/27/2019  . History of total knee replacement, bilateral 11/24/2019  . Nerve entrapment syndrome of lower extremity, right 10/24/2019  . Spine pain, multilevel 10/24/2019  . Controlled substance agreement signed 09/30/2019  . Emphysematous pyelitis 09/04/2019  . CKD stage G3b/A2, GFR 30-44 and albumin creatinine ratio 30-299 mg/g (HCC) 04/09/2019  . Hyperaldosteronism ([place]) 06/24/2018  . Diastolic dysfunction 26/94/8546  . OSA (obstructive sleep apnea) 06/24/2018  . Excessive daytime sleepiness 06/24/2018  . Aortic atherosclerosis ([place]) 06/17/2018  . Leg edema 12/18/2017  . BPH without urinary obstruction 06/03/2015  . Depression 03/05/2015  . Hyperlipidemia 09/03/2014  . Major depressive disorder, recurrent episode, moderate ([place]) 09/03/2014  . Chronic low back pain 09/03/2014  . Type 2 diabetes mellitus without complication ([place]) 27/06/5007  . Allergic rhinitis 07/04/2014  . Anxiety 07/04/2014  . Continuous opioid dependence ([place]) 07/04/2014  . Diabetes mellitus type 2 in obese ([place]) 07/04/2014  . DDD (degenerative disc disease), lumbar 07/04/2014  . Gout 07/04/2014  . Essential hypertension 07/04/2014  . Morbid obesity ([employer]) 07/04/2014  . Neuralgia neuritis, sciatic nerve 07/04/2014  . H/O malignant neoplasm of skin 12/24/2013    Past Surgical History:  Procedure Laterality Date  . ARTHROSCOPIC REPAIR ACL Left   . CYSTOSCOPY W/ URETERAL STENT PLACEMENT Right 09/07/2014   Procedure: CYSTOSCOPY WITH STENT REPLACEMENT;  Surgeon: Hollice Espy, MD;  Location: [facility];  Service: Urology;  Laterality: Right;  . CYSTOSCOPY WITH INSERTION OF UROLIFT N/A 11/24/2019   Procedure: CYSTOSCOPY WITH INSERTION OF UROLIFT;  Surgeon: Hollice Espy, MD;  Location: [facility];  Service: Urology;  Laterality: N/A;  . CYSTOSCOPY/URETEROSCOPY/HOLMIUM LASER/STENT PLACEMENT Right 08/17/2014   Procedure: CYSTOSCOPY/URETEROSCOPY//STENT PLACEMENT/ attempt of  lithotripsy;  Surgeon: Hollice Espy, MD;  Location: [facility];  Service: Urology;  Laterality: Right;  . HERNIA REPAIR    . JOINT REPLACEMENT    . MOHS SURGERY     lip  . TOTAL KNEE ARTHROPLASTY Left 04/24/2016   Procedure: LEFT TOTAL KNEE ARTHROPLASTY;  Surgeon: Gaynelle Arabian, MD;  Location: [facility];  Service: Orthopedics;  Laterality: Left;  Adductor Block  . TOTAL KNEE ARTHROPLASTY Right 10/23/2016   Procedure: RIGHT TOTAL KNEE ARTHROPLASTY;  Surgeon: Gaynelle Arabian, MD;  Location: [facility];  Service: Orthopedics;  Laterality: Right;  . URETEROSCOPY WITH HOLMIUM LASER LITHOTRIPSY Right 09/07/2014   Procedure: URETEROSCOPY WITH HOLMIUM LASER LITHOTRIPSY;  Surgeon: Hollice Espy, MD;  Location: [facility];  Service: Urology;  Laterality: Right;  [name] VASECTOMY      Family History  Problem Relation Age of Onset  . Hypertension Mother   . Cancer Mother   . Hyperlipidemia Mother   . Arthritis Mother   . Stroke Father   . Heart disease Father   . Obesity Father   . Depression Sister   . Hypertension Sister   . Hyperlipidemia Sister   . Learning disabilities Sister   . Obesity Sister   . Kidney disease Maternal Grandmother     Social History   Tobacco Use  . Smoking status: Never Smoker  . Smokeless tobacco: Never Used  Substance Use Topics  . Alcohol use: No     Current Outpatient Medications:  .  aspirin EC 81 MG tablet, Take 81 mg by mouth daily. Swallow whole., Disp: , Rfl:  .  atorvastatin (LIPITOR) 40 MG tablet, Take 1 tablet (40 mg total) by mouth at bedtime., Disp: 90 tablet, Rfl: 3 .  blood glucose meter kit and supplies, Dispense based on patient and insurance preference. Use up to four times daily as directed. (FOR ICD-10 E10.9, E11.9)., Disp: 1 each, Rfl: 0 .  Dulaglutide (TRULICITY) 1.5 NI/7.7OE SOPN, Inject 1.5 mg into the skin once a week., Disp: 6 mL, Rfl: 3 .  DULoxetine (CYMBALTA) 60 MG capsule, TAKE 1 CAPSULE EVERY DAY, Disp: 90 capsule, Rfl: 0 .  fluticasone  (FLONASE) 50 MCG/ACT nasal spray, Place 2 sprays into both nostrils daily. 2 sprays (Patient taking differently: Place 2 sprays into both nostrils daily as needed for allergies.), Disp: 16 g, Rfl: 6 .  glipiZIDE (GLUCOTROL) 10 MG tablet, TAKE 1 TABLET (10 MG TOTAL) BY MOUTH DAILY BEFORE BREAKFAST., Disp: 90 tablet, Rfl: 1 .  glucose blood (ACCU-CHEK AVIVA PLUS) test strip, Use as instructed, Disp: 100 each, Rfl: 12 .  HYDROcodone-acetaminophen (NORCO) 10-325 MG tablet, Take 1 tablet by mouth 3 (three) times daily., Disp: 90 tablet, Rfl: 0 .  [START ON 06/02/2020] HYDROcodone-acetaminophen (NORCO) 10-325 MG tablet, Take 1 tablet by mouth 3 (three) times daily., Disp: 90 tablet, Rfl: 0 .  hydrOXYzine (ATARAX/VISTARIL) 25 MG tablet, Take 25 mg by mouth 3 (three) times daily as needed for anxiety. , Disp: , Rfl:  .  metFORMIN (GLUCOPHAGE) 1000 MG tablet, Take 1 tablet (1,000 mg total) by mouth 2 (two) times daily with a meal., Disp: 180 tablet, Rfl: 1 .  Multiple Vitamin (MULTIVITAMIN) tablet, Take 1 tablet by mouth daily., Disp: , Rfl:  .  spironolactone (ALDACTONE) 25 MG tablet, Take 2 tablets (50 mg total) by mouth daily. (Patient taking differently: Take 50 mg by mouth daily. Taking 1 tablet), Disp: 180 tablet, Rfl: 3 .  tiZANidine (ZANAFLEX) 4 MG tablet, Take 1 tablet (4 mg total) by mouth every 8 (eight) hours as needed for muscle spasms., Disp: 60 tablet, Rfl: 3 .  TRUEplus Lancets 33G MISC, , Disp: , Rfl:  .  valsartan-hydrochlorothiazide (DIOVAN-HCT) 320-12.5 MG tablet, TAKE 1 TABLET EVERY DAY, Disp: 90 tablet, Rfl: 1 .  gabapentin (NEURONTIN) 300 MG capsule, Take 1 capsule (300 mg total) by mouth 3 (three) times daily. Emergency Supply while mail order comes in., Disp: 270 capsule, Rfl: 3 .  metoprolol succinate (TOPROL-XL) 100 MG 24 hr tablet, Take 1 tablet (100 mg total) by mouth daily. Take with or immediately following a meal., Disp: 90 tablet, Rfl: 2  Allergies  Allergen Reactions  .  Celebrex [Celecoxib]     GI issues  . Tetanus Toxoids Other (See Comments)    unkown  . Tetanus-Diphtheria Toxoids Td     Unknown    I personally reviewed active problem list, medication list, allergies, family history, social history, health maintenance with the patient/caregiver today.   ROS  Constitutional: Negative for fever or weight change.  Respiratory: Negative for cough and shortness of breath.   Cardiovascular: Negative for chest pain or palpitations.  Gastrointestinal: Negative for abdominal pain, no bowel changes.  Musculoskeletal: positive for gait problem, joint swelling.  Skin: Negative for rash.  Neurological: Negative for dizziness or headache.  No other specific complaints in a complete review of systems (except as listed in HPI above).  Objective  Vitals:   05/03/20 1335  BP: (!) 146/90  Pulse: 96  Resp: 16  Temp: 97.7 F (36.5 C)  TempSrc: Oral  SpO2: 99%  Weight: 266 lb 12.8 oz (121 kg)  Height: '6\' 3"'  (1.905 m)    Body mass index is 33.35 kg/m.  Physical Exam  Constitutional: Patient appears well-developed and well-nourished. Obese  No distress.  HEENT: head atraumatic, normocephalic, pupils equal and reactive to light,  neck supple Cardiovascular: Normal rate, regular rhythm and normal heart sounds.  No murmur heard. No BLE edema. Pulmonary/Chest: Effort normal and breath sounds normal. No respiratory distress. Abdominal: Soft.  There is no tenderness. Muscular skeletal: pain with rom of right hip , negative straight leg raise. Antalgic gait  Psychiatric: Patient has a normal mood and affect. behavior is normal. Judgment and thought content normal.  Recent Results (from the past 2160 hour(s))  BASIC METABOLIC PANEL WITH GFR     Status: Abnormal   Collection Time: 03/16/20 12:00 AM  Result Value Ref Range   Glucose, Bld 163 (H) 65 - 99 mg/dL    Comment: .            Fasting reference interval . For someone without known diabetes, a  glucose value >125 mg/dL indicates that they may have diabetes and this should be confirmed with a follow-up test. .    BUN 39 (H) 7 - 25 mg/dL   Creat 1.58 (H) 0.70 - 1.25 mg/dL    Comment: For patients >25 years of age, the reference limit for Creatinine is approximately 13% higher for people identified as African-American. .    GFR, Est Non African American 44 (L) > OR = 60 mL/min/1.46m   GFR, Est African American  51 (L) > OR = 60 mL/min/1.70m   BUN/Creatinine Ratio 25 (H) 6 - 22 (calc)   Sodium 139 135 - 146 mmol/L   Potassium 5.6 (H) 3.5 - 5.3 mmol/L   Chloride 106 98 - 110 mmol/L   CO2 24 20 - 32 mmol/L   Calcium 9.6 8.6 - 10.3 mg/dL  Hemoglobin A1c     Status: Abnormal   Collection Time: 03/16/20 12:00 AM  Result Value Ref Range   Hgb A1c MFr Bld 7.6 (H) <5.7 % of total Hgb    Comment: For someone without known diabetes, a hemoglobin A1c value of 6.5% or greater indicates that they may have  diabetes and this should be confirmed with a follow-up  test. . For someone with known diabetes, a value <7% indicates  that their diabetes is well controlled and a value  greater than or equal to 7% indicates suboptimal  control. A1c targets should be individualized based on  duration of diabetes, age, comorbid conditions, and  other considerations. . Currently, no consensus exists regarding use of hemoglobin A1c for diagnosis of diabetes for children. .    Mean Plasma Glucose 171 mg/dL   eAG (mmol/L) 9.5 mmol/L  Microalbumin / creatinine urine ratio     Status: None   Collection Time: 03/16/20 12:00 AM  Result Value Ref Range   Creatinine, Urine 113 20 - 320 mg/dL   Microalb, Ur 0.3 mg/dL    Comment: Reference Range Not established    Microalb Creat Ratio 3 <30 mcg/mg creat    Comment: . The ADA defines abnormalities in albumin excretion as follows: .Marland KitchenAlbuminuria Category        Result (mcg/mg creatinine) . Normal to Mildly increased   <30 Moderately increased          30-299  Severely increased           > OR = 300 . The ADA recommends that at least two of three specimens collected within a 3-6 month period be abnormal before considering a patient to be within a diagnostic category.      PHQ2/9: [facility] 05/03/2020 01/15/2020 12/31/2019 12/15/2019 09/30/2019  Decreased Interest 0 1 0 0 0  Down, Depressed, Hopeless 0 1 0 0 0  PHQ - 2 Score 0 2 0 0 0  Altered sleeping - 0 - - 0  Tired, decreased energy - 0 - - 0  Change in appetite - 0 - - 0  Feeling bad or failure about yourself  - 0 - - 0  Trouble concentrating - 0 - - 0  Moving slowly or fidgety/restless - 0 - - 0  Suicidal thoughts - 0 - - 0  PHQ-9 Score - 2 - - 0  Difficult doing work/chores - - - - Not difficult at all  Some recent data might be hidden    phq 9 is negative   Fall Risk: Fall Risk  05/03/2020 03/16/2020 01/15/2020 12/31/2019 12/15/2019  Falls in the past year? 0 0 0 0 0  Number falls in past yr: 0 0 0 0 0  Injury with Fall? 0 0 0 0 0  Risk for fall due to : - - No Fall Risks - -  Follow up - - Falls prevention discussed Falls evaluation completed Falls evaluation completed     Functional Status Survey: Is the patient deaf or have difficulty hearing?: No Does the patient have difficulty seeing, even when wearing glasses/contacts?: No Does the patient have difficulty  concentrating, remembering, or making decisions?: No Does the patient have difficulty walking or climbing stairs?: No Does the patient have difficulty dressing or bathing?: No Does the patient have difficulty doing errands alone such as visiting a doctor's office or shopping?: No   Assessment & Plan  1. Primary osteoarthritis of right hip  May proceed to surgery, pending HCT level  2. Type 2 diabetes mellitus in obese  [facility])  Explained importance of keeping glucose under control   3. Pre-op evaluation  - Hemoglobin and hematocrit, blood  4. Essential  hypertension  Slightly higher today, pain is high, we will continue current medications for now   5. Chronic narcotic use

## 2020-05-03 ENCOUNTER — Other Ambulatory Visit: Payer: Self-pay

## 2020-05-03 ENCOUNTER — Encounter: Payer: Self-pay | Admitting: Family Medicine

## 2020-05-03 ENCOUNTER — Ambulatory Visit: Payer: Medicare PPO | Admitting: Dermatology

## 2020-05-03 ENCOUNTER — Ambulatory Visit: Payer: Medicare PPO | Admitting: Family Medicine

## 2020-05-03 VITALS — BP 146/90 | HR 96 | Temp 97.7°F | Resp 16 | Ht 75.0 in | Wt 266.8 lb

## 2020-05-03 DIAGNOSIS — I10 Essential (primary) hypertension: Secondary | ICD-10-CM

## 2020-05-03 DIAGNOSIS — M1611 Unilateral primary osteoarthritis, right hip: Secondary | ICD-10-CM

## 2020-05-03 DIAGNOSIS — Z01818 Encounter for other preprocedural examination: Secondary | ICD-10-CM | POA: Diagnosis not present

## 2020-05-03 DIAGNOSIS — E1169 Type 2 diabetes mellitus with other specified complication: Secondary | ICD-10-CM

## 2020-05-03 DIAGNOSIS — E669 Obesity, unspecified: Secondary | ICD-10-CM

## 2020-05-03 DIAGNOSIS — F1190 Opioid use, unspecified, uncomplicated: Secondary | ICD-10-CM | POA: Diagnosis not present

## 2020-05-03 LAB — HEMOGLOBIN AND HEMATOCRIT, BLOOD
HCT: 44.1 % (ref 38.5–50.0)
Hemoglobin: 14.8 g/dL (ref 13.2–17.1)

## 2020-05-11 ENCOUNTER — Telehealth: Payer: Self-pay

## 2020-05-12 ENCOUNTER — Telehealth: Payer: Self-pay

## 2020-05-12 NOTE — Progress Notes (Signed)
Spoke to patient to confirmed patient telephone appointment on 05/13/2020 for CCM at 10:00 am with [name] the Clinical pharmacist.   Patient verbalized understanding  [place] Pharmacist Assistant [phone]

## 2020-05-13 ENCOUNTER — Ambulatory Visit (INDEPENDENT_AMBULATORY_CARE_PROVIDER_SITE_OTHER): Payer: Medicare PPO

## 2020-05-13 DIAGNOSIS — E119 Type 2 diabetes mellitus without complications: Secondary | ICD-10-CM

## 2020-05-13 DIAGNOSIS — I152 Hypertension secondary to endocrine disorders: Secondary | ICD-10-CM

## 2020-05-13 DIAGNOSIS — E785 Hyperlipidemia, unspecified: Secondary | ICD-10-CM | POA: Diagnosis not present

## 2020-05-13 DIAGNOSIS — E1159 Type 2 diabetes mellitus with other circulatory complications: Secondary | ICD-10-CM

## 2020-05-13 DIAGNOSIS — E1169 Type 2 diabetes mellitus with other specified complication: Secondary | ICD-10-CM | POA: Diagnosis not present

## 2020-05-13 NOTE — Progress Notes (Signed)
Chronic Care Management Pharmacy Note  05/13/2020 Name:  Evan Rivas MRN:  [id number] DOB:  May 11, 1950  Subjective: Evan Rivas is an 70 y.o. year old male who is a primary patient of Towanda Malkin, MD (Inactive).  The CCM team was consulted for assistance with disease management and care coordination needs.    Engaged with patient by telephone for follow up visit in response to provider referral for pharmacy case management and/or care coordination services.   Consent to Services:  The patient was given the following information about Chronic Care Management services today, agreed to services, and gave verbal consent: 1. CCM service includes personalized support from designated clinical staff supervised by the primary care provider, including individualized plan of care and coordination with other care providers 2. 24/7 contact phone numbers for assistance for urgent and routine care needs. 3. Service will only be billed when office clinical staff spend 20 minutes or more in a month to coordinate care. 4. Only one practitioner may furnish and bill the service in a calendar month. 5.The patient may stop CCM services at any time (effective at the end of the month) by phone call to the office staff. 6. The patient will be responsible for cost sharing (co-pay) of up to 20% of the service fee (after annual deductible is met). Patient agreed to services and consent obtained.  Patient Care Team: Towanda Malkin, MD (Inactive) as PCP - General (Internal Medicine) End, Harrell Gave, MD as PCP - Cardiology (Cardiology) Germaine Pomfret, [facility] (Pharmacist) Murlean Iba, MD as Consulting Physician (Nephrology)  Recent office visits: 05/03/20: Patient presented to [name] for follow-up. No medication changes made.  03/16/20: Patient presented to Dr. Roxan Hockey for follow-up. A1c improved to 7.6%. Hydrocodone-APAP refilled.   Recent consult visits: 03/15/20: Patient  presented to Dr. Candiss Norse (Nephrology) for follow-up. No medication changes made. 01/28/20: Patient presented to Dr. [name] for follow-up. Metoprolol increased to 100 mg daily.   Hospital visits: None in previous 6 months  Objective:  Lab Results  Component Value Date   CREATININE 1.58 (H) 03/16/2020   BUN 39 (H) 03/16/2020   GFRNONAA 44 (L) 03/16/2020   GFRAA 51 (L) 03/16/2020   NA 139 03/16/2020   K 5.6 (H) 03/16/2020   CALCIUM 9.6 03/16/2020   CO2 24 03/16/2020    Lab Results  Component Value Date/Time   HGBA1C 7.6 (H) 03/16/2020 12:00 AM   HGBA1C 9.2 (H) 12/31/2019 10:30 AM   HGBA1C 8.9 03/25/2018 10:00 AM   HGBA1C 8.9 (A) 03/25/2018 10:00 AM   HGBA1C 8.9 (A) 03/25/2018 10:00 AM   HGBA1C 8.0 (A) 12/18/2017 08:57 AM   MICROALBUR 0.3 03/16/2020 12:00 AM   MICROALBUR 10.9 04/07/2019 10:58 AM    Last diabetic Eye exam: No results found for: HMDIABEYEEXA  Last diabetic Foot exam: No results found for: HMDIABFOOTEX   Lab Results  Component Value Date   CHOL 156 12/31/2019   HDL 34 (L) 12/31/2019   LDLCALC 82 12/31/2019   TRIG 336 (H) 12/31/2019   CHOLHDL 4.6 12/31/2019    Hepatic Function Latest Ref Rng & Units 12/31/2019 04/07/2019 09/17/2017  Total Protein 6.1 - 8.1 g/dL 6.9 6.6 6.6  Albumin 3.6 - 4.8 g/dL - - 4.0  AST 10 - 35 U/L '14 14 13  ' ALT 9 - 46 U/L '24 24 16  ' Alk Phosphatase 39 - 117 IU/L - - 84  Total Bilirubin 0.2 - 1.2 mg/dL 0.3 0.3 0.3    Lab Results  Component Value Date/Time   TSH 3.98 12/31/2019 10:30 AM    CBC Latest Ref Rng & Units 05/03/2020 09/07/2019 09/06/2019  WBC 4.0 - 10.5 K/uL - 5.0 7.6  Hemoglobin 13.2 - 17.1 g/dL 14.8 10.5(L) 10.7(L)  Hematocrit 38.5 - 50.0 % 44.1 31.3(L) 32.2(L)  Platelets 150 - 400 K/uL - 182 174    No results found for: VD25OH  Clinical ASCVD: Yes  The 10-year ASCVD risk score [name], et al., 2013) is: 42.8%   Values used to calculate the score:     Age: 50 years     Sex: Male     Is Non-Hispanic African  American: No     Diabetic: Yes     Tobacco smoker: No     Systolic Blood Pressure: 299 mmHg     Is BP treated: Yes     HDL Cholesterol: 34 mg/dL     Total Cholesterol: 156 mg/dL    Depression screen [facility] 2/9 05/03/2020 01/15/2020 12/31/2019  Decreased Interest 0 1 0  Down, Depressed, Hopeless 0 1 0  PHQ - 2 Score 0 2 0  Altered sleeping - 0 -  Tired, decreased energy - 0 -  Change in appetite - 0 -  Feeling bad or failure about yourself  - 0 -  Trouble concentrating - 0 -  Moving slowly or fidgety/restless - 0 -  Suicidal thoughts - 0 -  PHQ-9 Score - 2 -  Difficult doing work/chores - - -  Some recent data might be hidden    Social History   Tobacco Use  Smoking Status Never Smoker  Smokeless Tobacco Never Used   BP Readings from Last 3 Encounters:  05/03/20 (!) 146/90  03/16/20 140/90  01/28/20 132/82   Pulse Readings from Last 3 Encounters:  05/03/20 96  03/16/20 (!) 109  01/28/20 80   Wt Readings from Last 3 Encounters:  05/03/20 266 lb 12.8 oz (121 kg)  03/16/20 271 lb 12.8 oz (123.3 kg)  01/28/20 265 lb (120.2 kg)    Assessment/Interventions: Review of patient past medical history, allergies, medications, health status, including review of consultants reports, laboratory and other test data, was performed as part of comprehensive evaluation and provision of chronic care management services.   SDOH:  (Social Determinants of Health) assessments and interventions performed: Yes SDOH Interventions   Flowsheet Row Most Recent Value  SDOH Interventions   Financial Strain Interventions Intervention Not Indicated  Transportation Interventions Intervention Not Indicated      CCM Care Plan  Allergies  Allergen Reactions  . Celebrex [Celecoxib]     GI issues  . Tetanus Toxoids Other (See Comments)    unkown  . Tetanus-Diphtheria Toxoids Td     Unknown    Medications Reviewed Today    Reviewed by Germaine Pomfret, [facility] (Pharmacist) on 05/13/20 at [place] Status: <None>  Medication Order Taking? Sig Documenting Provider Last Dose Status Informant  acetaminophen (TYLENOL) 650 MG CR tablet [id number] Yes Take 1,300 mg by mouth every 8 (eight) hours as needed for pain. [provider] Taking Active   aspirin EC 81 MG tablet [id number] Yes Take 81 mg by mouth daily. Swallow whole. [provider] Taking Active Self  atorvastatin (LIPITOR) 40 MG tablet [id number] Yes Take 1 tablet (40 mg total) by mouth at bedtime. Towanda Malkin, MD Taking Active   blood glucose meter kit and supplies [id number]  Dispense based on patient and insurance preference. Use up to  four times daily as directed. (FOR ICD-10 E10.9, E11.9). Towanda Malkin, MD  Active   Dulaglutide (TRULICITY) 1.5 NO/0.3BC [facility] [id number] Yes Inject 1.5 mg into the skin once a week. Steele Sizer, MD Taking Active Self           Med Note Riley Churches Jan 28, 2020  1:29 PM)    DULoxetine (CYMBALTA) 60 MG capsule [id number] Yes TAKE 1 CAPSULE EVERY DAY Towanda Malkin, MD Taking Active   fluticasone [facility]) 50 MCG/ACT nasal spray [id number]  Place 2 sprays into both nostrils daily. 2 sprays  Patient taking differently: Place 2 sprays into both nostrils daily as needed for allergies.   Hubbard Hartshorn, FNP  Active   gabapentin (NEURONTIN) 300 MG capsule [id number]  Take 1 capsule (300 mg total) by mouth 3 (three) times daily. Emergency Supply while mail order comes in. Lebron Conners D, MD  Expired 03/30/20 2359   glipiZIDE (GLUCOTROL) 10 MG tablet [id number] Yes TAKE 1 TABLET (10 MG TOTAL) BY MOUTH DAILY BEFORE BREAKFAST. Towanda Malkin, MD Taking Active   glucose blood (ACCU-CHEK AVIVA PLUS) test strip [id number]  Use as instructed Towanda Malkin, MD  Active   HYDROcodone-acetaminophen [facility]) 10-325 MG tablet [id number]  Take 1 tablet by mouth 3 (three) times daily. Towanda Malkin, MD  Active    HYDROcodone-acetaminophen [facility]) 10-325 MG tablet [id number]  Take 1 tablet by mouth 3 (three) times daily. Towanda Malkin, MD  Active   hydrOXYzine (ATARAX/VISTARIL) 25 MG tablet [id number]  Take 25 mg by mouth 3 (three) times daily as needed for anxiety.  [provider]  Active Self  metFORMIN (GLUCOPHAGE) 1000 MG tablet [id number]  Take 1 tablet (1,000 mg total) by mouth 2 (two) times daily with a meal. Towanda Malkin, MD  Active   metoprolol succinate (TOPROL-XL) 100 MG 24 hr tablet [id number]  Take 1 tablet (100 mg total) by mouth daily. Take with or immediately following a meal. End, Harrell Gave, MD  Expired 04/27/20 2359   Multiple Vitamin (MULTIVITAMIN) tablet [id number]  Take 1 tablet by mouth daily. [provider]  Active Self  spironolactone (ALDACTONE) 25 MG tablet [id number] Yes Take 25 mg by mouth daily. [provider] Taking Active            Med Note Michaelle Birks, Cathe Mons A   Thu May 13, 2020 10:36 AM) Prescribed by Dr. Candiss Norse  tiZANidine (ZANAFLEX) 4 MG tablet [id number]  Take 1 tablet (4 mg total) by mouth every 8 (eight) hours as needed for muscle spasms. Towanda Malkin, MD  Active   TRUEplus Lancets 33G [place] [id number]   [provider]  Active   valsartan-hydrochlorothiazide (DIOVAN-HCT) 320-12.5 MG tablet [id number] Yes TAKE 1 TABLET EVERY DAY Towanda Malkin, MD Taking Active   Med List Note Rise Patience, RN 04/23/18 1019): UDS 02/26/18 MR 08/06/2018 Opiod contract signed by pt.           Patient Active Problem List   Diagnosis Date Noted  . Osteoarthritis of right hip 01/07/2020  . Hip pain, right 12/31/2019  . Trochanteric bursitis of right hip 11/27/2019  . History of total knee replacement, bilateral 11/24/2019  . Nerve entrapment syndrome of lower extremity, right 10/24/2019  . Spine pain, multilevel 10/24/2019  . Controlled substance agreement signed 09/30/2019  . Emphysematous pyelitis  09/04/2019  . CKD stage G3b/A2, GFR 30-44 and albumin creatinine ratio 30-299 mg/g (HCC) 04/09/2019  . Hyperaldosteronism ([place]) 06/24/2018  .  Diastolic dysfunction 10/62/6948  . OSA (obstructive sleep apnea) 06/24/2018  . Excessive daytime sleepiness 06/24/2018  . Aortic atherosclerosis ([place]) 06/17/2018  . Leg edema 12/18/2017  . BPH without urinary obstruction 06/03/2015  . Depression 03/05/2015  . Hyperlipidemia 09/03/2014  . Major depressive disorder, recurrent episode, moderate ([place]) 09/03/2014  . Chronic low back pain 09/03/2014  . Allergic rhinitis 07/04/2014  . Anxiety 07/04/2014  . Continuous opioid dependence ([place]) 07/04/2014  . Diabetes mellitus type 2 in obese ([place]) 07/04/2014  . DDD (degenerative disc disease), lumbar 07/04/2014  . Gout 07/04/2014  . Essential hypertension 07/04/2014  . Morbid obesity ([place]) 07/04/2014  . Neuralgia neuritis, sciatic nerve 07/04/2014  . H/O malignant neoplasm of skin 12/24/2013    Immunization History  Administered Date(s) Administered  . Fluad Quad(high Dose 65+) 11/20/2018, 12/15/2019  . Influenza, High Dose Seasonal PF 12/22/2015, 01/22/2017, 12/18/2017  . Influenza, Seasonal, Injecte, Preservative Fre 12/18/2011  . Influenza,inj,Quad PF,6+ Mos 01/06/2013, 12/05/2013, 01/06/2015  . Influenza-Unspecified 12/02/2013  . PFIZER(Purple Top)SARS-COV-2 Vaccination 06/05/2019, 06/26/2019, 03/03/2020  . Pneumococcal Conjugate-13 09/17/2017  . Pneumococcal Polysaccharide-23 07/20/2016  . Tdap 06/05/2013    Conditions to be addressed/monitored:  Hypertension, Hyperlipidemia, Diabetes, Coronary Artery Disease, Chronic Kidney Disease, Depression, Anxiety, Gout and Chronic Pain  Care Plan : General Pharmacy (Adult)  Updates made by Germaine Pomfret, RPH since 05/13/2020 12:00 AM    Problem: Hypertension, Hyperlipidemia, Diabetes, Coronary Artery Disease, Chronic Kidney Disease, Depression, Anxiety, Gout and Chronic Pain   Priority: High     Long-Range Goal: Patient-Specific Goal   Start Date: 05/13/2020  Expected End Date: 11/11/2020  This Visit's Progress: On track  Priority: High  Note:   Current Barriers:  . Unable to independently afford treatment regimen . Unable to maintain control of Diabetes  Pharmacist Clinical Goal(s):  [name] Over the next 90 days, patient will verbalize ability to afford treatment regimen . maintain control of diabetes as evidenced by A1c less than 8%  through collaboration with PharmD and provider.   Interventions: . 1:1 collaboration with Towanda Malkin, MD (Inactive) regarding development and update of comprehensive plan of care as evidenced by provider attestation and co-signature . Inter-disciplinary care team collaboration (see longitudinal plan of care) . Comprehensive medication review performed; medication list updated in electronic medical record  Hypertension (BP goal <130/80) -uncontrolled -Current treatment: . Metoprolol XL 100 mg daily  . Spironolactone 25 mg daily  . Valsartan-HCTZ 320-12.5 mg daily  -Medications previously tried: NA  -Current home readings: 144/92, 144/84, 160,89, 170/100,  -Patient reports blood pressures have been elevated recently due to pain -Denies hypotensive/hypertensive symptoms -Educated on Daily salt intake goal < 2300 mg; Exercise goal of 150 minutes per week; -Counseled to monitor BP at home 1-2 times weekly, document, and provide log at future appointments -Recommended to continue current medication, anticipate blood pressures improving as pain management improves   Hyperlipidemia: (LDL goal < 70) -controlled -Current treatment: . Atorvastatin 40 mg daily  -Medications previously tried: NA  -Educated on Importance of limiting foods high in cholesterol; -Recommended to continue current medication  Diabetes (A1c goal <8%) -controlled -Current medications: . Trulicity 1.5 mg weekly  . Glipizide 10 mg daily  . Metformin 1000 mg  twice daily  -Medications previously tried: NA  -Current home glucose readings . fasting glucose: 144, 150, 130, 145, 147, 133, 141, 114  . post prandial glucose: 214, 252 (received steroid shot at that time).  -Denies hypoglycemic/hyperglycemic symptoms -Current meal patterns:  . breakfast: NA  .  lunch: NA  . dinner: NA . snacks: cutting back on chips and sweets. Pistachios, strawberries  . drinks: Water, diet soda  -Current exercise: Patient was previously exercising at the gym more frequently, but has been unable to go to do his severe hip pain   -Educated on Exercise goal of 150 minutes per week; Carbohydrate counting and/or plate method -Counseled to check feet daily and get yearly eye exams -Recommended to continue current medication  Depression/Anxiety (Goal: maintain stable mood) -controlled -Current treatment: . Duloxetine 60 mg daily  . Hydroxyzine 25 mg three times daily as needed for anxiety  -Medications previously tried/failed: NA -PHQ9: 2 -GAD7: 5 -Educated on Benefits of medication for symptom control -Recommended to continue current medication  Chronic Pain (Goal: achieve adequate pain control) -uncontrolled -Current treatment  . Acetaminophen CR 650 mg 2 tablets 2-3 times daily as needed  . Duloxetine 60 mg daily  . Gabapentin 300 mg three times daily  . Hydrocodone-APAP 10-325 mg three times daily  . Tizanidine 4 mg every 8 hours as needed- Muscle Spasms  -Medications previously tried: NA  -Patient scheduled for hip surgery in March, hoping for his appointment to be moved up.  -Last steroid injection was ineffective at helping his pain. -Recommended to continue current medication Counseled on limiting APAP use to less than 3000 mg daily from ALL sources  Patient Goals/Self-Care Activities . Over the next 90 days, patient will:  - check glucose daily (fasting), document, and provide at future appointments check blood pressure 1-2 times weekly, document,  and provide at future appointments  Follow Up Plan: Telephone follow up appointment with care management team member scheduled for: 11/11/2020 at 9:00 AM      Medication Assistance: Trulicity obtained through [employer] medication assistance program.  Enrollment ends 04/02/2021  Patient's preferred pharmacy is:  [place], [place] [address] Phone: [phone] Fax: [phone]  [id number] [place], [place] - [address] [place] [address] Phone: [phone] Fax: [phone]  Uses pill box? Yes Pt endorses 100% compliance  We discussed: Current pharmacy is preferred with insurance plan and patient is satisfied with pharmacy services Patient decided to: Continue current medication management strategy  Care Plan and Follow Up Patient Decision:  Patient agrees to Care Plan and Follow-up.  Plan: Telephone follow up appointment with care management team member scheduled for:  11/11/2020 at 9:00 AM  [facility] [phone]

## 2020-05-13 NOTE — Patient Instructions (Signed)
Visit Information It was great speaking with you today!  Please let me know if you have any questions about our visit.  Goals Addressed            This Visit's Progress   . Monitor and Manage My Blood Sugar-Diabetes Type 2       Timeframe:  Long-Range Goal Priority:  High Start Date:   05/13/2020                          Expected End Date:    11/11/2020                   Follow Up Date 07/31/2020    - check blood sugar at prescribed times - check blood sugar if I feel it is too high or too low - take the blood sugar meter to all doctor visits    Why is this important?    Checking your blood sugar at home helps to keep it from getting very high or very low.   Writing the results in a diary or log helps the doctor know how to care for you.   Your blood sugar log should have the time, date and the results.   Also, write down the amount of insulin or other medicine that you take.   Other information, like what you ate, exercise done and how you were feeling, will also be helpful.     Notes:        Patient Care Plan: General Pharmacy (Adult)    Problem Identified: Hypertension, Hyperlipidemia, Diabetes, Coronary Artery Disease, Chronic Kidney Disease, Depression, Anxiety, Gout and Chronic Pain   Priority: High    Long-Range Goal: Patient-Specific Goal   Start Date: 05/13/2020  Expected End Date: 11/11/2020  This Visit's Progress: On track  Priority: High  Note:   Current Barriers:  . Unable to independently afford treatment regimen . Unable to maintain control of Diabetes  Pharmacist Clinical Goal(s):  [name] Over the next 90 days, patient will verbalize ability to afford treatment regimen . maintain control of diabetes as evidenced by A1c less than 8%  through collaboration with PharmD and provider.   Interventions: . 1:1 collaboration with Towanda Malkin, MD (Inactive) regarding development and update of comprehensive plan of care as evidenced by provider  attestation and co-signature . Inter-disciplinary care team collaboration (see longitudinal plan of care) . Comprehensive medication review performed; medication list updated in electronic medical record  Hypertension (BP goal <130/80) -uncontrolled -Current treatment: . Metoprolol XL 100 mg daily  . Spironolactone 25 mg daily  . Valsartan-HCTZ 320-12.5 mg daily  -Medications previously tried: NA  -Current home readings: 144/92, 144/84, 160,89, 170/100,  -Patient reports blood pressures have been elevated recently due to pain -Denies hypotensive/hypertensive symptoms -Educated on Daily salt intake goal < 2300 mg; Exercise goal of 150 minutes per week; -Counseled to monitor BP at home 1-2 times weekly, document, and provide log at future appointments -Recommended to continue current medication, anticipate blood pressures improving as pain management improves   Hyperlipidemia: (LDL goal < 70) -controlled -Current treatment: . Atorvastatin 40 mg daily  -Medications previously tried: NA  -Educated on Importance of limiting foods high in cholesterol; -Recommended to continue current medication  Diabetes (A1c goal <8%) -controlled -Current medications: . Trulicity 1.5 mg weekly  . Glipizide 10 mg daily  . Metformin 1000 mg twice daily  -Medications previously tried: NA  -Current home glucose readings . fasting  glucose: 144, 150, 130, 145, 147, 133, 141, 114  . post prandial glucose: 214, 252 (received steroid shot at that time).  -Denies hypoglycemic/hyperglycemic symptoms -Current meal patterns:  . breakfast: NA  . lunch: NA  . dinner: NA . snacks: cutting back on chips and sweets. Pistachios, strawberries  . drinks: Water, diet soda  -Current exercise: Patient was previously exercising at the gym more frequently, but has been unable to go to do his severe hip pain   -Educated on Exercise goal of 150 minutes per week; Carbohydrate counting and/or plate method -Counseled to  check feet daily and get yearly eye exams -Recommended to continue current medication  Depression/Anxiety (Goal: maintain stable mood) -controlled -Current treatment: . Duloxetine 60 mg daily  . Hydroxyzine 25 mg three times daily as needed for anxiety  -Medications previously tried/failed: NA -PHQ9: 2 -GAD7: 5 -Educated on Benefits of medication for symptom control -Recommended to continue current medication  Chronic Pain (Goal: achieve adequate pain control) -uncontrolled -Current treatment  . Acetaminophen CR 650 mg 2 tablets 2-3 times daily as needed  . Duloxetine 60 mg daily  . Gabapentin 300 mg three times daily  . Hydrocodone-APAP 10-325 mg three times daily  . Tizanidine 4 mg every 8 hours as needed- Muscle Spasms  -Medications previously tried: NA  -Patient scheduled for hip surgery in March, hoping for his appointment to be moved up.  -Last steroid injection was ineffective at helping his pain. -Recommended to continue current medication Counseled on limiting APAP use to less than 3000 mg daily from ALL sources  Patient Goals/Self-Care Activities . Over the next 90 days, patient will:  - check glucose daily (fasting), document, and provide at future appointments check blood pressure 1-2 times weekly, document, and provide at future appointments  Follow Up Plan: Telephone follow up appointment with care management team member scheduled for: 11/11/2020 at 9:00 AM      Patient agreed to services and verbal consent obtained.   The patient verbalized understanding of instructions, educational materials, and care plan provided today and declined offer to receive copy of patient instructions, educational materials, and care plan.   [facility] [phone]

## 2020-06-11 NOTE — H&P (Signed)
TOTAL HIP ADMISSION H&P  Patient is admitted for right total hip arthroplasty.  Subjective:  Chief Complaint: Right hip pain  HPI: Evan Rivas, 70 y.o. male, has a history of pain and functional disability in the right hip due to arthritis and patient has failed non-surgical conservative treatments for greater than 12 weeks to include corticosteriod injections and activity modification. Onset of symptoms was gradual, starting several years ago with gradually worsening course since that time. The patient noted no past surgery on the right hip. Patient currently rates pain in the right hip at 7 out of 10 with activity. Patient has worsening of pain with activity and weight bearing, trendelenberg gait, pain with passive range of motion and crepitus. Patient has evidence of periarticular osteophytes and joint space narrowing by imaging studies. This condition presents safety issues increasing the risk of falls. There is no current active infection.  Patient Active Problem List   Diagnosis Date Noted  . Osteoarthritis of right hip 01/07/2020  . Hip pain, right 12/31/2019  . Trochanteric bursitis of right hip 11/27/2019  . History of total knee replacement, bilateral 11/24/2019  . Nerve entrapment syndrome of lower extremity, right 10/24/2019  . Spine pain, multilevel 10/24/2019  . Controlled substance agreement signed 09/30/2019  . Emphysematous pyelitis 09/04/2019  . CKD stage G3b/A2, GFR 30-44 and albumin creatinine ratio 30-299 mg/g (HCC) 04/09/2019  . Hyperaldosteronism ([place]) 06/24/2018  . Diastolic dysfunction 80/32/1224  . OSA (obstructive sleep apnea) 06/24/2018  . Excessive daytime sleepiness 06/24/2018  . Aortic atherosclerosis ([place]) 06/17/2018  . Leg edema 12/18/2017  . BPH without urinary obstruction 06/03/2015  . Depression 03/05/2015  . Hyperlipidemia 09/03/2014  . Major depressive disorder, recurrent episode, moderate ([place]) 09/03/2014  . Chronic low back pain 09/03/2014   . Allergic rhinitis 07/04/2014  . Anxiety 07/04/2014  . Continuous opioid dependence ([place]) 07/04/2014  . Diabetes mellitus type 2 in obese ([place]) 07/04/2014  . DDD (degenerative disc disease), lumbar 07/04/2014  . Gout 07/04/2014  . Essential hypertension 07/04/2014  . Morbid obesity ([place]) 07/04/2014  . Neuralgia neuritis, sciatic nerve 07/04/2014  . H/O malignant neoplasm of skin 12/24/2013    Past Medical History:  Diagnosis Date  . Anxiety    panic attacks  . Arthritis   . Cancer (HCC)    lip/basal  . Chronic kidney disease   . Depression   . Diabetes mellitus without complication ([place])    type 2  . GERD (gastroesophageal reflux disease)   . Gout   . History of kidney stones   . Hyperaldosteronism ([place])    bilateral adrenal hyperplasia  . Hyperlipidemia   . Hypertension   . Nerve entrapment syndrome of lower extremity, right 10/24/2019  . Pneumonia    2014 and 2018 ( 2018-klebsiella pneumonia   . Sleep apnea     Past Surgical History:  Procedure Laterality Date  . ARTHROSCOPIC REPAIR ACL Left   . CYSTOSCOPY W/ URETERAL STENT PLACEMENT Right 09/07/2014   Procedure: CYSTOSCOPY WITH STENT REPLACEMENT;  Surgeon: Hollice Espy, MD;  Location: [facility];  Service: Urology;  Laterality: Right;  . CYSTOSCOPY WITH INSERTION OF UROLIFT N/A 11/24/2019   Procedure: CYSTOSCOPY WITH INSERTION OF UROLIFT;  Surgeon: Hollice Espy, MD;  Location: [facility];  Service: Urology;  Laterality: N/A;  . CYSTOSCOPY/URETEROSCOPY/HOLMIUM LASER/STENT PLACEMENT Right 08/17/2014   Procedure: CYSTOSCOPY/URETEROSCOPY//STENT PLACEMENT/ attempt of lithotripsy;  Surgeon: Hollice Espy, MD;  Location: [facility];  Service: Urology;  Laterality: Right;  . HERNIA REPAIR    .  JOINT REPLACEMENT    . MOHS SURGERY     lip  . TOTAL KNEE ARTHROPLASTY Left 04/24/2016   Procedure: LEFT TOTAL KNEE ARTHROPLASTY;  Surgeon: Gaynelle Arabian, MD;  Location: [facility];  Service: Orthopedics;  Laterality: Left;  Adductor Block   . TOTAL KNEE ARTHROPLASTY Right 10/23/2016   Procedure: RIGHT TOTAL KNEE ARTHROPLASTY;  Surgeon: Gaynelle Arabian, MD;  Location: [facility];  Service: Orthopedics;  Laterality: Right;  . URETEROSCOPY WITH HOLMIUM LASER LITHOTRIPSY Right 09/07/2014   Procedure: URETEROSCOPY WITH HOLMIUM LASER LITHOTRIPSY;  Surgeon: Hollice Espy, MD;  Location: [facility];  Service: Urology;  Laterality: Right;  [name] VASECTOMY      Prior to Admission medications   Medication Sig Start Date End Date Taking? Authorizing Provider  acetaminophen (TYLENOL) 650 MG CR tablet Take 1,300 mg by mouth every 8 (eight) hours as needed for pain.   Yes [provider]  aspirin EC 81 MG tablet Take 81 mg by mouth daily. Swallow whole.   Yes [provider]  atorvastatin (LIPITOR) 40 MG tablet Take 1 tablet (40 mg total) by mouth at bedtime. 11/26/19  Yes Towanda Malkin, MD  Dulaglutide (TRULICITY) 1.5 JK/9.3OI SOPN Inject 1.5 mg into the skin once a week. 06/27/19  Yes Sowles, Drue Stager, MD  DULoxetine (CYMBALTA) 60 MG capsule TAKE 1 CAPSULE EVERY DAY Patient taking differently: Take 60 mg by mouth daily. 02/12/20  Yes Towanda Malkin, MD  fluticasone (FLONASE) 50 MCG/ACT nasal spray Place 2 sprays into both nostrils daily. 2 sprays Patient taking differently: Place 2 sprays into both nostrils daily as needed for allergies. 03/25/18  Yes Hubbard Hartshorn, FNP  gabapentin (NEURONTIN) 300 MG capsule Take 300 mg by mouth 3 (three) times daily.   Yes [provider]  glipiZIDE (GLUCOTROL) 10 MG tablet TAKE 1 TABLET (10 MG TOTAL) BY MOUTH DAILY BEFORE BREAKFAST. 03/04/20  Yes Towanda Malkin, MD  HYDROcodone-acetaminophen (NORCO) 10-325 MG tablet Take 1 tablet by mouth 3 (three) times daily. 06/02/20 07/02/20 Yes Towanda Malkin, MD  hydrOXYzine (ATARAX/VISTARIL) 25 MG tablet Take 25 mg by mouth 3 (three) times daily as needed for anxiety.    Yes [provider]  metFORMIN (GLUCOPHAGE)  1000 MG tablet Take 1 tablet (1,000 mg total) by mouth 2 (two) times daily with a meal. 02/09/20  Yes Lebron Conners D, MD  metoprolol succinate (TOPROL-XL) 100 MG 24 hr tablet Take 100 mg by mouth daily. Take with or immediately following a meal.   Yes [provider]  Multiple Vitamin (MULTIVITAMIN) tablet Take 1 tablet by mouth daily.   Yes [provider]  spironolactone (ALDACTONE) 25 MG tablet Take 25 mg by mouth daily.   Yes [provider]  tiZANidine (ZANAFLEX) 4 MG tablet Take 1 tablet (4 mg total) by mouth every 8 (eight) hours as needed for muscle spasms. 12/31/19  Yes Towanda Malkin, MD  valsartan-hydrochlorothiazide (DIOVAN-HCT) 320-12.5 MG tablet TAKE 1 TABLET EVERY DAY Patient taking differently: Take 1 tablet by mouth daily. 02/12/20  Yes Towanda Malkin, MD  blood glucose meter kit and supplies Dispense based on patient and insurance preference. Use up to four times daily as directed. (FOR ICD-10 E10.9, E11.9). 12/31/19   Towanda Malkin, MD  glucose blood (ACCU-CHEK AVIVA PLUS) test strip Use as instructed 12/15/19   Towanda Malkin, MD  TRUEplus Lancets 33G [place]  01/05/20   [provider]    Allergies  Allergen Reactions  . Celebrex [Celecoxib]  GI issues  . Tetanus Toxoids Other (See Comments)    unkown  . Tetanus-Diphtheria Toxoids Td     Unknown    Social History   Socioeconomic History  . Marital status: Married    Spouse name: Hoyle Sauer  . Number of children: 1  . Years of education: Not on file  . Highest education level: Not on file  Occupational History  . Occupation: retired  Tobacco Use  . Smoking status: Never Smoker  . Smokeless tobacco: Never Used  Vaping Use  . Vaping Use: Never used  Substance and Sexual Activity  . Alcohol use: No  . Drug use: No  . Sexual activity: Not Currently    Birth control/protection: None  Other Topics Concern  . Not on file  Social History  Narrative  . Not on file   Social Determinants of Health   Financial Resource Strain: Low Risk   . Difficulty of Paying Living Expenses: Not hard at all  Food Insecurity: No Food Insecurity  . Worried About Charity fundraiser in the Last Year: Never true  . Ran Out of Food in the Last Year: Never true  Transportation Needs: No Transportation Needs  . Lack of Transportation (Medical): No  . Lack of Transportation (Non-Medical): No  Physical Activity: Inactive  . Days of Exercise per Week: 0 days  . Minutes of Exercise per Session: 0 min  Stress: No Stress Concern Present  . Feeling of Stress : Only a little  Social Connections: Socially Integrated  . Frequency of Communication with Friends and Family: More than three times a week  . Frequency of Social Gatherings with Friends and Family: More than three times a week  . Attends Religious Services: More than 4 times per year  . Active Member of Clubs or Organizations: Yes  . Attends Archivist Meetings: 1 to 4 times per year  . Marital Status: Married  Human resources officer Violence: Not At Risk  . Fear of Current or Ex-Partner: No  . Emotionally Abused: No  . Physically Abused: No  . Sexually Abused: No    Tobacco Use: Low Risk   . Smoking Tobacco Use: Never Smoker  . Smokeless Tobacco Use: Never Used   Social History   Substance and Sexual Activity  Alcohol Use No    Family History  Problem Relation Age of Onset  . Hypertension Mother   . Cancer Mother   . Hyperlipidemia Mother   . Arthritis Mother   . Stroke Father   . Heart disease Father   . Obesity Father   . Depression Sister   . Hypertension Sister   . Hyperlipidemia Sister   . Learning disabilities Sister   . Obesity Sister   . Kidney disease Maternal Grandmother     Review of Systems  Constitutional: Negative for chills and fever.  HENT: Negative for congestion, sore throat and tinnitus.   Eyes: Negative for double vision, photophobia and  pain.  Respiratory: Negative for cough, shortness of breath and wheezing.   Cardiovascular: Negative for chest pain, palpitations and orthopnea.  Gastrointestinal: Negative for heartburn, nausea and vomiting.  Genitourinary: Negative for dysuria, frequency and urgency.  Musculoskeletal: Positive for joint pain.  Neurological: Negative for dizziness, weakness and headaches.     Objective:  Physical Exam: Well nourished and well developed.  General: Alert and oriented x3, cooperative and pleasant, no acute distress.  Head: normocephalic, atraumatic, neck supple.  Eyes: EOMI.  Respiratory: breath sounds clear in all  fields, no wheezing, rales, or rhonchi. Cardiovascular: Regular rate and rhythm, no murmurs, gallops or rubs.  Abdomen: non-tender to palpation and soft, normoactive bowel sounds. Musculoskeletal:  Right Hip Exam:  ROM: Flexion is to 95 with pain. Internal rotation is to 10, which is worse than it has been and is very painful. External rotation is 25. Abduction is 25.  There is no tenderness over the greater trochanter.   Calves soft and nontender. Motor function intact in LE. Strength 5/5 LE bilaterally. Neuro: Distal pulses 2+. Sensation to light touch intact in LE.  Imaging Review Plain radiographs demonstrate severe degenerative joint disease of the right hip. The bone quality appears to be adequate for age and reported activity level.  Assessment/Plan:  End stage arthritis, right hip  The patient history, physical examination, clinical judgement of the provider and imaging studies are consistent with end stage degenerative joint disease of the right hip and total hip arthroplasty is deemed medically necessary. The treatment options including medical management, injection therapy, arthroscopy and arthroplasty were discussed at length. The risks and benefits of total hip arthroplasty were presented and reviewed. The risks due to aseptic loosening, infection, stiffness,  dislocation/subluxation, thromboembolic complications and other imponderables were discussed. The patient acknowledged the explanation, agreed to proceed with the plan and consent was signed. Patient is being admitted for inpatient treatment for surgery, pain control, PT, OT, prophylactic antibiotics, VTE prophylaxis, progressive ambulation and ADLs and discharge planning.The patient is planning to be discharged home.   Patient's anticipated LOS is less than 2 midnights, meeting these requirements: - Younger than 21 - Lives within 1 hour of care - Has a competent adult at home to recover with post-op recover - NO history of  - Chronic pain requiring opiods  - Diabetes  - Coronary Artery Disease  - Heart failure  - Heart attack  - Stroke  - DVT/VTE  - Cardiac arrhythmia  - Respiratory Failure/COPD  - Renal failure  - Anemia  - Advanced Liver disease  Therapy Plans: HEP Disposition: Home with wife Planned DVT Prophylaxis: Aspirin 325 mg BID DME Needed: None PCP: Steele Sizer, MD (clearance received) Cardiologist: Nelva Bush, MD (clearance received) TXA: IV Allergies: NKDA Anesthesia Concerns: None BMI: 34.2 Last HgbA1c: 7.6% (will recheck with preop labs) Pharmacy: [facility] ([place])  Other: - Norco 10-325 TID for chronic pain. Discussed oxycodone for postoperative pain management.   - Patient was instructed on what medications to stop prior to surgery. - Follow-up visit in 2 weeks with Dr. Wynelle Link - Begin physical therapy following surgery - Pre-operative lab work as pre-surgical testing - Prescriptions will be provided in hospital at time of discharge  Theresa Duty, PA-C Orthopedic Surgery [facility] [place]

## 2020-06-12 ENCOUNTER — Other Ambulatory Visit (HOSPITAL_COMMUNITY)
Admission: RE | Admit: 2020-06-12 | Discharge: 2020-06-12 | Disposition: A | Discharge status: Home / Self Care | Payer: Medicare PPO | Source: Ambulatory Visit | Attending: Orthopedic Surgery | Admitting: Orthopedic Surgery

## 2020-06-12 DIAGNOSIS — Z20822 Contact with and (suspected) exposure to covid-19: Secondary | ICD-10-CM | POA: Insufficient documentation

## 2020-06-12 DIAGNOSIS — Z01812 Encounter for preprocedural laboratory examination: Secondary | ICD-10-CM | POA: Diagnosis present

## 2020-06-12 LAB — SARS CORONAVIRUS 2 (TAT 6-24 HRS): SARS Coronavirus 2: NEGATIVE

## 2020-06-14 ENCOUNTER — Ambulatory Visit: Payer: Medicare PPO | Admitting: Family Medicine

## 2020-06-14 ENCOUNTER — Other Ambulatory Visit: Payer: Self-pay

## 2020-06-14 ENCOUNTER — Encounter: Payer: Self-pay | Admitting: Family Medicine

## 2020-06-14 VITALS — BP 130/80 | HR 110 | Temp 97.5°F | Resp 16 | Ht 75.0 in | Wt 262.1 lb

## 2020-06-14 DIAGNOSIS — M545 Low back pain, unspecified: Secondary | ICD-10-CM

## 2020-06-14 DIAGNOSIS — E119 Type 2 diabetes mellitus without complications: Secondary | ICD-10-CM | POA: Diagnosis not present

## 2020-06-14 DIAGNOSIS — E669 Obesity, unspecified: Secondary | ICD-10-CM

## 2020-06-14 DIAGNOSIS — E1169 Type 2 diabetes mellitus with other specified complication: Secondary | ICD-10-CM | POA: Diagnosis not present

## 2020-06-14 DIAGNOSIS — M5136 Other intervertebral disc degeneration, lumbar region: Secondary | ICD-10-CM

## 2020-06-14 DIAGNOSIS — N1832 Chronic kidney disease, stage 3b: Secondary | ICD-10-CM | POA: Diagnosis not present

## 2020-06-14 DIAGNOSIS — I10 Essential (primary) hypertension: Secondary | ICD-10-CM

## 2020-06-14 DIAGNOSIS — M51369 Other intervertebral disc degeneration, lumbar region without mention of lumbar back pain or lower extremity pain: Secondary | ICD-10-CM

## 2020-06-14 DIAGNOSIS — F1120 Opioid dependence, uncomplicated: Secondary | ICD-10-CM

## 2020-06-14 DIAGNOSIS — N400 Enlarged prostate without lower urinary tract symptoms: Secondary | ICD-10-CM

## 2020-06-14 DIAGNOSIS — G8929 Other chronic pain: Secondary | ICD-10-CM

## 2020-06-14 DIAGNOSIS — F331 Major depressive disorder, recurrent, moderate: Secondary | ICD-10-CM

## 2020-06-14 DIAGNOSIS — G4719 Other hypersomnia: Secondary | ICD-10-CM

## 2020-06-14 DIAGNOSIS — Z79899 Other long term (current) drug therapy: Secondary | ICD-10-CM

## 2020-06-14 LAB — POCT GLYCOSYLATED HEMOGLOBIN (HGB A1C): Hemoglobin A1C: 6.6 % — AB (ref 4.0–5.6)

## 2020-06-14 MED ORDER — HYDROCODONE-ACETAMINOPHEN 10-325 MG PO TABS
1.0000 | ORAL_TABLET | Freq: Three times a day (TID) | ORAL | 0 refills | Status: DC
Start: 2020-06-28 — End: 2020-07-22

## 2020-06-14 NOTE — Assessment & Plan Note (Signed)
Recommend compliance with CPAP ?

## 2020-06-14 NOTE — Assessment & Plan Note (Signed)
Doing well on current regimen, no changes made today. 

## 2020-06-14 NOTE — Patient Instructions (Addendum)
DUE TO COVID-19 ONLY ONE VISITOR IS ALLOWED TO COME WITH YOU AND STAY IN THE WAITING ROOM ONLY DURING PRE OP AND PROCEDURE DAY OF SURGERY. THE 1 VISITOR  MAY VISIT WITH YOU AFTER SURGERY IN YOUR PRIVATE ROOM DURING VISITING HOURS ONLY!  YOU NEED TO HAVE A COVID 19 TEST ON: 06/12/20, THIS TEST MUST BE DONE BEFORE SURGERY,  COVID TESTING SITE [address] [place] [id number], IT IS ON THE RIGHT GOING OUT [address] APPROXIMATELY  2 MINUTES PAST [employer] ON THE RIGHT. ONCE YOUR COVID TEST IS COMPLETED,  PLEASE BEGIN THE QUARANTINE INSTRUCTIONS AS OUTLINED IN YOUR HANDOUT.                [name]    Your procedure is scheduled on: 06/16/20   Report to [facility] Main  Entrance   Report to admitting at: 7:45 AM     Call this number if you have problems the morning of surgery [phone]    Remember:  NO SOLID FOOD AFTER MIDNIGHT THE NIGHT PRIOR TO SURGERY. NOTHING BY MOUTH EXCEPT CLEAR LIQUIDS UNTIL: 7:15 AM . PLEASE FINISH ENSURE DRINK PER SURGEON ORDER  WHICH NEEDS TO BE COMPLETED AT: 7:15 AM .  CLEAR LIQUID DIET  Foods Allowed                                                                     Foods Excluded  Coffee and tea, regular and decaf                             liquids that you cannot  Plain Jell-O any favor except red or purple                                           see through such as: Fruit ices (not with fruit pulp)                                     milk, soups, orange juice  Iced Popsicles                                    All solid food Carbonated beverages, regular and diet                                    Cranberry, grape and apple juices Sports drinks like Gatorade Lightly seasoned clear broth or consume(fat free) Sugar, honey syrup  Sample Menu Breakfast                                Lunch  Supper Cranberry juice                    Beef broth                             Chicken broth Jell-O                                     Grape juice                           Apple juice Coffee or tea                        Jell-O                                      Popsicle                                                Coffee or tea                        Coffee or tea  _____________________________________________________________________   BRUSH YOUR TEETH MORNING OF SURGERY AND RINSE YOUR MOUTH OUT, NO CHEWING GUM CANDY OR MINTS.    Take these medicines the morning of surgery with A SIP OF WATER: duloxetine,gabapentin.       How to Manage Your Diabetes Before and After Surgery  Why is it important to control my blood sugar before and after surgery? . Improving blood sugar levels before and after surgery helps healing and can limit problems. . A way of improving blood sugar control is eating a healthy diet by: o  Eating less sugar and carbohydrates o  Increasing activity/exercise o  Talking with your doctor about reaching your blood sugar goals . High blood sugars (greater than 180 mg/dL) can raise your risk of infections and slow your recovery, so you will need to focus on controlling your diabetes during the weeks before surgery. . Make sure that the doctor who takes care of your diabetes knows about your planned surgery including the date and location.  How do I manage my blood sugar before surgery? . Check your blood sugar at least 4 times a day, starting 2 days before surgery, to make sure that the level is not too high or low. o Check your blood sugar the morning of your surgery when you wake up and every 2 hours until you get to the Short Stay unit. . If your blood sugar is less than 70 mg/dL, you will need to treat for low blood sugar: o Do not take insulin. o Treat a low blood sugar (less than 70 mg/dL) with  cup of clear juice (cranberry or apple), 4 glucose tablets, OR glucose gel. o Recheck blood sugar in 15 minutes after treatment (to make sure it  is greater than 70 mg/dL). If your blood sugar is not greater than 70 mg/dL on recheck, call [phone] for further instructions. . Report your blood sugar to the short stay nurse when you get to Short Stay.  [name]  If you are admitted to the hospital after surgery: o Your blood sugar will be checked by the staff and you will probably be given insulin after surgery (instead of oral diabetes medicines) to make sure you have good blood sugar levels. o The goal for blood sugar control after surgery is 80-180 mg/dL.   WHAT DO I DO ABOUT MY DIABETES MEDICATION?  [name] Do not take oral diabetes medicines (pills) the morning of surgery.  . THE DAY BEFORE SURGERY, take Metformin as usual. Take ONLY the morning dose of Glipizid.       . THE MORNING OF SURGERY, DO NOT TAKE ANY DIABETES MEDICINE.  [name] The day of surgery, do not take other diabetes injectables, including Byetta (exenatide), Bydureon (exenatide ER), Victoza (liraglutide), or Trulicity (dulaglutide).                          You may not have any metal on your body including hair pins and              piercings  Do not wear jewelry, lotions, powders or perfumes, deodorant             Men may shave face and neck.   Do not bring valuables to the hospital. [place].  Contacts, dentures or bridgework may not be worn into surgery.  Leave suitcase in the car. After surgery it may be brought to your room.     Patients discharged the day of surgery will not be allowed to drive home. IF YOU ARE HAVING SURGERY AND GOING HOME THE SAME DAY, YOU MUST HAVE AN ADULT TO DRIVE YOU HOME AND BE WITH YOU FOR 24 HOURS. YOU MAY GO HOME BY TAXI OR UBER OR ORTHERWISE, BUT AN ADULT MUST ACCOMPANY YOU HOME AND STAY WITH YOU FOR 24 HOURS.  Name and phone number of your driver:  Special Instructions: N/A              Please read over the following fact sheets you were  given: ____________________________________________________________________          [facility] - Preparing for Surgery Before surgery, you can play an important role.  Because skin is not sterile, your skin needs to be as free of germs as possible.  You can reduce the number of germs on your skin by washing with CHG (chlorahexidine gluconate) soap before surgery.  CHG is an antiseptic cleaner which kills germs and bonds with the skin to continue killing germs even after washing. Please DO NOT use if you have an allergy to CHG or antibacterial soaps.  If your skin becomes reddened/irritated stop using the CHG and inform your nurse when you arrive at Short Stay. Do not shave (including legs and underarms) for at least 48 hours prior to the first CHG shower.  You may shave your face/neck. Please follow these instructions carefully:  1.  Shower with CHG Soap the night before surgery and the  morning of Surgery.  2.  If you choose to wash your hair, wash your hair first as usual with your  normal  shampoo.  3.  After you shampoo, rinse your hair and body thoroughly to remove the  shampoo.  4.  Use CHG as you would any other liquid soap.  You can apply chg directly  to the skin and wash                       Gently with a scrungie or clean washcloth.  5.  Apply the CHG Soap to your body ONLY FROM THE NECK DOWN.   Do not use on face/ open                           Wound or open sores. Avoid contact with eyes, ears mouth and genitals (private parts).                       Wash face,  Genitals (private parts) with your normal soap.             6.  Wash thoroughly, paying special attention to the area where your surgery  will be performed.  7.  Thoroughly rinse your body with warm water from the neck down.  8.  DO NOT shower/wash with your normal soap after using and rinsing off  the CHG Soap.                9.  Pat yourself dry with a clean towel.            10.  Wear clean  pajamas.            11.  Place clean sheets on your bed the night of your first shower and do not  sleep with pets. Day of Surgery : Do not apply any lotions/deodorants the morning of surgery.  Please wear clean clothes to the hospital/surgery center.  FAILURE TO FOLLOW THESE INSTRUCTIONS MAY RESULT IN THE CANCELLATION OF YOUR SURGERY PATIENT SIGNATURE_________________________________  NURSE SIGNATURE__________________________________  ________________________________________________________________________   Adam Phenix  An incentive spirometer is a tool that can help keep your lungs clear and active. This tool measures how well you are filling your lungs with each breath. Taking long deep breaths may help reverse or decrease the chance of developing breathing (pulmonary) problems (especially infection) following:  A long period of time when you are unable to move or be active. BEFORE THE PROCEDURE   If the spirometer includes an indicator to show your best effort, your nurse or respiratory therapist will set it to a desired goal.  If possible, sit up straight or lean slightly forward. Try not to slouch.  Hold the incentive spirometer in an upright position. INSTRUCTIONS FOR USE  1. Sit on the edge of your bed if possible, or sit up as far as you can in bed or on a chair. 2. Hold the incentive spirometer in an upright position. 3. Breathe out normally. 4. Place the mouthpiece in your mouth and seal your lips tightly around it. 5. Breathe in slowly and as deeply as possible, raising the piston or the ball toward the top of the column. 6. Hold your breath for 3-5 seconds or for as long as possible. Allow the piston or ball to fall to the bottom of the column. 7. Remove the mouthpiece from your mouth and breathe out normally. 8. Rest for a few seconds and repeat Steps 1 through 7 at least 10 times every 1-2 hours when you are awake. Take your time and take a few normal breaths  between deep breaths. 9. The spirometer may include an indicator to  show your best effort. Use the indicator as a goal to work toward during each repetition. 10. After each set of 10 deep breaths, practice coughing to be sure your lungs are clear. If you have an incision (the cut made at the time of surgery), support your incision when coughing by placing a pillow or rolled up towels firmly against it. Once you are able to get out of bed, walk around indoors and cough well. You may stop using the incentive spirometer when instructed by your caregiver.  RISKS AND COMPLICATIONS  Take your time so you do not get dizzy or light-headed.  If you are in pain, you may need to take or ask for pain medication before doing incentive spirometry. It is harder to take a deep breath if you are having pain. AFTER USE  Rest and breathe slowly and easily.  It can be helpful to keep track of a log of your progress. Your caregiver can provide you with a simple table to help with this. If you are using the spirometer at home, follow these instructions: [place] IF:   You are having difficultly using the spirometer.  You have trouble using the spirometer as often as instructed.  Your pain medication is not giving enough relief while using the spirometer.  You develop fever of 100.5 F (38.1 C) or higher. SEEK IMMEDIATE MEDICAL CARE IF:   You cough up bloody sputum that had not been present before.  You develop fever of 102 F (38.9 C) or greater.  You develop worsening pain at or near the incision site. MAKE SURE YOU:   Understand these instructions.  Will watch your condition.  Will get help right away if you are not doing well or get worse. Document Released: 07/31/2006 Document Revised: 06/12/2011 Document Reviewed: 10/01/2006 [facility] Patient Information 2014 [place], [place].   ________________________________________________________________________

## 2020-06-14 NOTE — Assessment & Plan Note (Signed)
A1c 6.6 today. Doing well on current regimen, no changes made today. Foot exam completed, wnl.

## 2020-06-14 NOTE — Patient Instructions (Addendum)
It was great to see you!  Our plans for today:  - Your A1c is better, 6.6 today. No changes to your medications.  - If you choose not to follow up with pain management, we will start to wean your pain medication down after surgery at followup. Come back in 3 months for follow up for this.  - Losing weight will help offload stress from your back. Yoga has also been helping at strengthening core muscles and supporting your back, I recommend trying this.  We are checking some labs today, we will release these results to your MyChart.  Take care and seek immediate care sooner if you develop any concerns.   Dr. Ky Barban

## 2020-06-14 NOTE — Progress Notes (Signed)
   SUBJECTIVE:   CHIEF COMPLAINT / HPI:   Hypertension/OSA: - Medications: metoprolol 100mg  daily, spironolactone 25mg  daily, valsartan-HCTZ 320-12.5mg  daily - Compliance: good - Checking BP at home: yes, 120-150s SBP - Denies any SOB, CP, vision changes, LE edema, medication SEs, or symptoms of hypotension - compliant with CPAP: no  Diabetes, Type 2 - Last A1c 7.6 03/2020 - Medications: trulicity 1.5mg  weekly, glipizide 10mg  daily, metformin 1000mg  BID - Compliance: good - Checking BG at home: yes, 100-140s. - Eye exam: due - Foot exam: due - Statin: yes - Denies symptoms of hypoglycemia, polyuria, polydipsia, numbness extremities, foot ulcers/trauma   Benign Prostatic Hypertrophy - Medications: none - had urolift in August - goes back in September  Anxiety/Depression - Medications: cymbalta 60mg  daily, atarax 25mg  TID prn - Taking: not taking atarax as often. - Counseling: no - Symptoms: none - Current stressors: none currently.   Chronic narcotics - for chronic low back pain and knee pain. S/p b/l knee replacements. Undergoing R THR 06/16/20. Due for refill 06/27/20. Previously saw pain management, declines referral.    OBJECTIVE:   BP 130/80   Pulse (!) 110   Temp (!) 97.5 F (36.4 C) (Oral)   Resp 16   Ht 6\' 3"  (1.905 m)   Wt 262 lb 1.6 oz (118.9 kg)   SpO2 98%   BMI 32.76 kg/m   Gen: well appearing, overweight, in NAD Card: RRR Lungs: CTAB Ext: WWP, no edema. Foot exam wnl.  ASSESSMENT/PLAN:   Essential hypertension Doing well on current regimen, no changes made today.  Diabetes mellitus type 2 in obese (HCC) A1c 6.6 today. Doing well on current regimen, no changes made today. Foot exam completed, wnl.   CKD stage G3b/A2, GFR 30-44 and albumin creatinine ratio 30-299 mg/g Rechecking labs today.  Continuous opioid dependence For chronic low back pain, knee pain. S/p b/l knee replacements, due for THR this week. Much discussion had regarding risks  of ongoing chronic narcotics, especially with aging and potential less need for ongoing narcotics with joint replacements. Previously seen pain management, hesitant for re-referral. Advised if he is unwilling to see pain management, we will start to wean his chronic narcotics after surgery at follow up. Recommend weight loss and core strengthening to better support back and lessen pain. F/u in 3 months.  Morbid obesity ([place]) Contributing to HTN, DM, chronic back pain. Recommend weight loss through diet and exercise.  Excessive daytime sleepiness Recommend compliance with CPAP.   Major depressive disorder, recurrent episode, moderate Doing well on current regimen, no changes made today.  BPH without urinary obstruction Doing well on current regimen, no changes made today.    Myles Gip, DO

## 2020-06-14 NOTE — Assessment & Plan Note (Signed)
Contributing to HTN, DM, chronic back pain. Recommend weight loss through diet and exercise.

## 2020-06-14 NOTE — Assessment & Plan Note (Addendum)
For chronic low back pain, knee pain. S/p b/l knee replacements, due for THR this week. Much discussion had regarding risks of ongoing chronic narcotics, especially with aging and potential less need for ongoing narcotics with joint replacements. Previously seen pain management, hesitant for re-referral. Advised if he is unwilling to see pain management, we will start to wean his chronic narcotics after surgery at follow up. Recommend weight loss and core strengthening to better support back and lessen pain. F/u in 3 months.

## 2020-06-14 NOTE — Assessment & Plan Note (Signed)
Rechecking labs today.

## 2020-06-15 ENCOUNTER — Other Ambulatory Visit: Payer: Self-pay

## 2020-06-15 ENCOUNTER — Encounter (HOSPITAL_COMMUNITY): Payer: Self-pay

## 2020-06-15 ENCOUNTER — Encounter (HOSPITAL_COMMUNITY): Payer: Self-pay | Admitting: Orthopedic Surgery

## 2020-06-15 ENCOUNTER — Encounter (HOSPITAL_COMMUNITY)
Admission: RE | Admit: 2020-06-15 | Discharge: 2020-06-15 | Disposition: A | Discharge status: Home / Self Care | Payer: Medicare PPO | Source: Ambulatory Visit | Attending: Orthopedic Surgery | Admitting: Orthopedic Surgery

## 2020-06-15 DIAGNOSIS — Z01812 Encounter for preprocedural laboratory examination: Secondary | ICD-10-CM | POA: Insufficient documentation

## 2020-06-15 DIAGNOSIS — E119 Type 2 diabetes mellitus without complications: Secondary | ICD-10-CM

## 2020-06-15 LAB — COMPREHENSIVE METABOLIC PANEL
ALT: 19 U/L (ref 0–44)
AST: 19 U/L (ref 15–41)
Albumin: 3.8 g/dL (ref 3.5–5.0)
Alkaline Phosphatase: 51 U/L (ref 38–126)
Anion gap: 8 (ref 5–15)
BUN: 36 mg/dL — ABNORMAL HIGH (ref 8–23)
CO2: 24 mmol/L (ref 22–32)
Calcium: 9.3 mg/dL (ref 8.9–10.3)
Chloride: 107 mmol/L (ref 98–111)
Creatinine, Ser: 1.67 mg/dL — ABNORMAL HIGH (ref 0.61–1.24)
GFR, Estimated: 44 mL/min — ABNORMAL LOW (ref >60)
Glucose, Bld: 155 mg/dL — ABNORMAL HIGH (ref 70–99)
Potassium: 5.1 mmol/L (ref 3.5–5.1)
Sodium: 139 mmol/L (ref 135–145)
Total Bilirubin: 0.7 mg/dL (ref 0.3–1.2)
Total Protein: 6.9 g/dL (ref 6.5–8.1)

## 2020-06-15 LAB — CBC
HCT: 39.4 % (ref 39.0–52.0)
Hemoglobin: 12.8 g/dL — ABNORMAL LOW (ref 13.0–17.0)
MCH: 32.9 pg (ref 26.0–34.0)
MCHC: 32.5 g/dL (ref 30.0–36.0)
MCV: 101.3 fL — ABNORMAL HIGH (ref 80.0–100.0)
Platelets: 225 10*3/uL (ref 150–400)
RBC: 3.89 MIL/uL — ABNORMAL LOW (ref 4.22–5.81)
RDW: 12.9 % (ref 11.5–15.5)
WBC: 6.4 10*3/uL (ref 4.0–10.5)
nRBC: 0 % (ref 0.0–0.2)

## 2020-06-15 LAB — BASIC METABOLIC PANEL
BUN/Creatinine Ratio: 24 (calc) — ABNORMAL HIGH (ref 6–22)
BUN: 32 mg/dL — ABNORMAL HIGH (ref 7–25)
CO2: 26 mmol/L (ref 20–32)
Calcium: 9.6 mg/dL (ref 8.6–10.3)
Chloride: 106 mmol/L (ref 98–110)
Creat: 1.35 mg/dL — ABNORMAL HIGH (ref 0.70–1.25)
Glucose, Bld: 150 mg/dL — ABNORMAL HIGH (ref 65–99)
Potassium: 5.6 mmol/L — ABNORMAL HIGH (ref 3.5–5.3)
Sodium: 142 mmol/L (ref 135–146)

## 2020-06-15 LAB — SURGICAL PCR SCREEN
MRSA, PCR: NEGATIVE
Staphylococcus aureus: POSITIVE — AB

## 2020-06-15 LAB — APTT: aPTT: 27 seconds (ref 24–36)

## 2020-06-15 LAB — PROTIME-INR
INR: 1 (ref 0.8–1.2)
Prothrombin Time: 12.7 seconds (ref 11.4–15.2)

## 2020-06-15 LAB — HEMOGLOBIN A1C
Hgb A1c MFr Bld: 7.4 % — ABNORMAL HIGH (ref 4.8–5.6)
Mean Plasma Glucose: 165.68 mg/dL

## 2020-06-15 LAB — GLUCOSE, CAPILLARY: Glucose-Capillary: 143 mg/dL — ABNORMAL HIGH (ref 70–99)

## 2020-06-15 MED ORDER — TRUE METRIX BLOOD GLUCOSE TEST VI STRP: ORAL_STRIP | 12 refills | Status: DC

## 2020-06-15 NOTE — Progress Notes (Addendum)
COVID Vaccine Completed: Yes Date COVID Vaccine completed: 03/03/20 [place] COVID vaccine manufacturer: Pfizer     PCP - Dr. Lebron Conners Cardiologist - Dr. Harrell Gave End. Clearance: Kerin Ransom: PAC: 04/28/20: EPIC  Chest x-ray -  EKG - 09/22/19 Stress Test -  ECHO - 02/19/18 Cardiac Cath -  Pacemaker/ICD device last checked:  Sleep Study - Yes CPAP - No  Fasting Blood Sugar -  Checks Blood Sugar _____ times a day  Blood Thinner Instructions: Aspirin on hold since saturday Aspirin Instructions: By Dr. Wynelle Link Last Dose:  Anesthesia review: Hx: DIA,HTN,OSA(NO CPAP)  Patient denies shortness of breath, fever, cough and chest pain at PAT appointment   Patient verbalized understanding of instructions that were given to them at the PAT appointment. Patient was also instructed that they will need to review over the PAT instructions again at home before surgery.

## 2020-06-15 NOTE — Progress Notes (Signed)
PCR: POSITIVE:STAPH. Cr: 1.67

## 2020-06-16 ENCOUNTER — Ambulatory Visit (HOSPITAL_COMMUNITY): Payer: Medicare PPO | Admitting: Anesthesiology

## 2020-06-16 ENCOUNTER — Encounter (HOSPITAL_COMMUNITY)
Admission: RE | Disposition: A | Discharge status: Home / Self Care | Payer: Self-pay | Source: Ambulatory Visit | Attending: Orthopedic Surgery

## 2020-06-16 ENCOUNTER — Observation Stay (HOSPITAL_COMMUNITY)
Admission: RE | Admit: 2020-06-16 | Discharge: 2020-06-17 | Disposition: A | Discharge status: Home / Self Care | Payer: Medicare PPO | Source: Ambulatory Visit | Attending: Orthopedic Surgery | Admitting: Orthopedic Surgery

## 2020-06-16 ENCOUNTER — Observation Stay (HOSPITAL_COMMUNITY): Payer: Medicare PPO

## 2020-06-16 ENCOUNTER — Ambulatory Visit (HOSPITAL_COMMUNITY): Payer: Medicare PPO

## 2020-06-16 ENCOUNTER — Encounter (HOSPITAL_COMMUNITY): Payer: Self-pay | Admitting: Orthopedic Surgery

## 2020-06-16 DIAGNOSIS — Z7984 Long term (current) use of oral hypoglycemic drugs: Secondary | ICD-10-CM | POA: Insufficient documentation

## 2020-06-16 DIAGNOSIS — Z79899 Other long term (current) drug therapy: Secondary | ICD-10-CM | POA: Insufficient documentation

## 2020-06-16 DIAGNOSIS — Z7982 Long term (current) use of aspirin: Secondary | ICD-10-CM | POA: Diagnosis not present

## 2020-06-16 DIAGNOSIS — E1122 Type 2 diabetes mellitus with diabetic chronic kidney disease: Secondary | ICD-10-CM | POA: Diagnosis not present

## 2020-06-16 DIAGNOSIS — Z85820 Personal history of malignant melanoma of skin: Secondary | ICD-10-CM | POA: Insufficient documentation

## 2020-06-16 DIAGNOSIS — N1832 Chronic kidney disease, stage 3b: Secondary | ICD-10-CM | POA: Insufficient documentation

## 2020-06-16 DIAGNOSIS — Z96653 Presence of artificial knee joint, bilateral: Secondary | ICD-10-CM | POA: Insufficient documentation

## 2020-06-16 DIAGNOSIS — Z419 Encounter for procedure for purposes other than remedying health state, unspecified: Secondary | ICD-10-CM

## 2020-06-16 DIAGNOSIS — I129 Hypertensive chronic kidney disease with stage 1 through stage 4 chronic kidney disease, or unspecified chronic kidney disease: Secondary | ICD-10-CM | POA: Insufficient documentation

## 2020-06-16 DIAGNOSIS — M1611 Unilateral primary osteoarthritis, right hip: Secondary | ICD-10-CM | POA: Diagnosis present

## 2020-06-16 DIAGNOSIS — Z96649 Presence of unspecified artificial hip joint: Secondary | ICD-10-CM

## 2020-06-16 HISTORY — PX: TOTAL HIP ARTHROPLASTY: SHX124

## 2020-06-16 LAB — GLUCOSE, CAPILLARY
Glucose-Capillary: 189 mg/dL — ABNORMAL HIGH (ref 70–99)
Glucose-Capillary: 130 mg/dL — ABNORMAL HIGH (ref 70–99)
Glucose-Capillary: 225 mg/dL — ABNORMAL HIGH (ref 70–99)
Glucose-Capillary: 247 mg/dL — ABNORMAL HIGH (ref 70–99)

## 2020-06-16 LAB — TYPE AND SCREEN
ABO/RH(D): A POS
Antibody Screen: NEGATIVE

## 2020-06-16 SURGERY — ARTHROPLASTY, HIP, TOTAL, ANTERIOR APPROACH
Anesthesia: Spinal | Site: Hip | Laterality: Right

## 2020-06-16 MED ORDER — POVIDONE-IODINE 10 % EX SWAB
2.0000 "application " | Freq: Once | CUTANEOUS | Status: AC
  Administered 2020-06-16: 2 via TOPICAL

## 2020-06-16 MED ORDER — PHENOL 1.4 % MT LIQD: 1.0000 | OROMUCOSAL | Status: DC | PRN

## 2020-06-16 MED ORDER — CEFAZOLIN SODIUM-DEXTROSE 2-4 GM/100ML-% IV SOLN
2.0000 g | Freq: Four times a day (QID) | INTRAVENOUS | Status: AC
  Administered 2020-06-16 (×2): 2 g via INTRAVENOUS
  Filled 2020-06-16 (×2): qty 100

## 2020-06-16 MED ORDER — ACETAMINOPHEN 325 MG PO TABS
325.0000 mg | ORAL_TABLET | Freq: Four times a day (QID) | ORAL | Status: DC | PRN
Start: 2020-06-17 — End: 2020-06-17

## 2020-06-16 MED ORDER — METHOCARBAMOL 500 MG IVPB - SIMPLE MED
500.0000 mg | Freq: Four times a day (QID) | INTRAVENOUS | Status: DC | PRN
  Filled 2020-06-16: qty 50

## 2020-06-16 MED ORDER — OXYCODONE HCL 5 MG PO TABS
5.0000 mg | ORAL_TABLET | ORAL | Status: DC | PRN
  Administered 2020-06-16 (×2): 10 mg via ORAL
  Administered 2020-06-16: 5 mg via ORAL
  Administered 2020-06-17 (×2): 10 mg via ORAL
  Filled 2020-06-16 (×2): qty 2
  Filled 2020-06-16: qty 1
  Filled 2020-06-16 (×3): qty 2

## 2020-06-16 MED ORDER — ONDANSETRON HCL 4 MG PO TABS: 4.0000 mg | ORAL_TABLET | Freq: Four times a day (QID) | ORAL | Status: DC | PRN

## 2020-06-16 MED ORDER — LACTATED RINGERS IV SOLN: INTRAVENOUS | Status: DC

## 2020-06-16 MED ORDER — POLYETHYLENE GLYCOL 3350 17 G PO PACK: 17.0000 g | PACK | Freq: Every day | ORAL | Status: DC | PRN

## 2020-06-16 MED ORDER — HYDROMORPHONE HCL 1 MG/ML IJ SOLN: 0.2500 mg | INTRAMUSCULAR | Status: DC | PRN

## 2020-06-16 MED ORDER — INSULIN ASPART 100 UNIT/ML ~~LOC~~ SOLN
0.0000 [IU] | Freq: Three times a day (TID) | SUBCUTANEOUS | Status: DC
  Administered 2020-06-16: 5 [IU] via SUBCUTANEOUS
  Administered 2020-06-17: 3 [IU] via SUBCUTANEOUS

## 2020-06-16 MED ORDER — ACETAMINOPHEN 10 MG/ML IV SOLN
1000.0000 mg | Freq: Four times a day (QID) | INTRAVENOUS | Status: DC
  Administered 2020-06-16: 1000 mg via INTRAVENOUS
  Filled 2020-06-16: qty 100

## 2020-06-16 MED ORDER — METOCLOPRAMIDE HCL 5 MG/ML IJ SOLN
5.0000 mg | Freq: Three times a day (TID) | INTRAMUSCULAR | Status: DC | PRN
Start: 2020-06-16 — End: 2020-06-17

## 2020-06-16 MED ORDER — METOCLOPRAMIDE HCL 5 MG PO TABS
5.0000 mg | ORAL_TABLET | Freq: Three times a day (TID) | ORAL | Status: DC | PRN
Start: 2020-06-16 — End: 2020-06-17

## 2020-06-16 MED ORDER — BUPIVACAINE HCL (PF) 0.25 % IJ SOLN
INTRAMUSCULAR | Status: AC
  Filled 2020-06-16: qty 30

## 2020-06-16 MED ORDER — TRANEXAMIC ACID-NACL 1000-0.7 MG/100ML-% IV SOLN
1000.0000 mg | INTRAVENOUS | Status: AC
  Administered 2020-06-16: 1000 mg via INTRAVENOUS
  Filled 2020-06-16: qty 100

## 2020-06-16 MED ORDER — GLIPIZIDE 10 MG PO TABS
10.0000 mg | ORAL_TABLET | Freq: Every day | ORAL | Status: DC
  Administered 2020-06-17: 10 mg via ORAL
  Filled 2020-06-16: qty 1

## 2020-06-16 MED ORDER — ASPIRIN EC 325 MG PO TBEC
325.0000 mg | DELAYED_RELEASE_TABLET | Freq: Two times a day (BID) | ORAL | Status: DC
  Administered 2020-06-17: 325 mg via ORAL
  Filled 2020-06-16: qty 1

## 2020-06-16 MED ORDER — ONDANSETRON HCL 4 MG/2ML IJ SOLN: 4.0000 mg | Freq: Four times a day (QID) | INTRAMUSCULAR | Status: DC | PRN

## 2020-06-16 MED ORDER — FLUTICASONE PROPIONATE 50 MCG/ACT NA SUSP
2.0000 | Freq: Every day | NASAL | Status: DC
  Filled 2020-06-16: qty 16

## 2020-06-16 MED ORDER — DEXAMETHASONE SODIUM PHOSPHATE 10 MG/ML IJ SOLN
INTRAMUSCULAR | Status: AC
  Filled 2020-06-16: qty 2

## 2020-06-16 MED ORDER — PROPOFOL 1000 MG/100ML IV EMUL
INTRAVENOUS | Status: AC
  Filled 2020-06-16: qty 200

## 2020-06-16 MED ORDER — BUPIVACAINE-EPINEPHRINE (PF) 0.25% -1:200000 IJ SOLN
INTRAMUSCULAR | Status: DC | PRN
  Administered 2020-06-16: 30 mL via PERINEURAL

## 2020-06-16 MED ORDER — MENTHOL 3 MG MT LOZG: 1.0000 | LOZENGE | OROMUCOSAL | Status: DC | PRN

## 2020-06-16 MED ORDER — SODIUM CHLORIDE 0.9 % IV SOLN: INTRAVENOUS | Status: DC

## 2020-06-16 MED ORDER — ONDANSETRON HCL 4 MG/2ML IJ SOLN: 4.0000 mg | Freq: Once | INTRAMUSCULAR | Status: DC | PRN

## 2020-06-16 MED ORDER — DEXAMETHASONE SODIUM PHOSPHATE 10 MG/ML IJ SOLN
10.0000 mg | Freq: Once | INTRAMUSCULAR | Status: AC
  Administered 2020-06-17: 10 mg via INTRAVENOUS
  Filled 2020-06-16: qty 1

## 2020-06-16 MED ORDER — ORAL CARE MOUTH RINSE: 15.0000 mL | Freq: Once | OROMUCOSAL | Status: AC

## 2020-06-16 MED ORDER — DEXAMETHASONE SODIUM PHOSPHATE 10 MG/ML IJ SOLN: 8.0000 mg | Freq: Once | INTRAMUSCULAR | Status: DC

## 2020-06-16 MED ORDER — METOPROLOL SUCCINATE ER 50 MG PO TB24
100.0000 mg | ORAL_TABLET | Freq: Every day | ORAL | Status: DC
  Administered 2020-06-16: 100 mg via ORAL
  Filled 2020-06-16: qty 2

## 2020-06-16 MED ORDER — HYDROXYZINE HCL 25 MG PO TABS: 25.0000 mg | ORAL_TABLET | Freq: Three times a day (TID) | ORAL | Status: DC | PRN

## 2020-06-16 MED ORDER — HYDROCHLOROTHIAZIDE 12.5 MG PO CAPS
12.5000 mg | ORAL_CAPSULE | Freq: Every day | ORAL | Status: DC
  Administered 2020-06-17: 12.5 mg via ORAL
  Filled 2020-06-16: qty 1

## 2020-06-16 MED ORDER — WATER FOR IRRIGATION, STERILE IR SOLN
Status: DC | PRN
  Administered 2020-06-16: 2000 mL

## 2020-06-16 MED ORDER — DEXAMETHASONE SODIUM PHOSPHATE 10 MG/ML IJ SOLN
INTRAMUSCULAR | Status: DC | PRN
  Administered 2020-06-16: 4 mg via INTRAVENOUS

## 2020-06-16 MED ORDER — MIDAZOLAM HCL 2 MG/2ML IJ SOLN
INTRAMUSCULAR | Status: AC
  Filled 2020-06-16: qty 2

## 2020-06-16 MED ORDER — BISACODYL 10 MG RE SUPP: 10.0000 mg | Freq: Every day | RECTAL | Status: DC | PRN

## 2020-06-16 MED ORDER — METHOCARBAMOL 500 MG PO TABS
500.0000 mg | ORAL_TABLET | Freq: Four times a day (QID) | ORAL | Status: DC | PRN
  Administered 2020-06-16 – 2020-06-17 (×3): 500 mg via ORAL
  Filled 2020-06-16 (×3): qty 1

## 2020-06-16 MED ORDER — ONDANSETRON HCL 4 MG/2ML IJ SOLN
INTRAMUSCULAR | Status: DC | PRN
  Administered 2020-06-16: 4 mg via INTRAVENOUS

## 2020-06-16 MED ORDER — FENTANYL CITRATE (PF) 100 MCG/2ML IJ SOLN
INTRAMUSCULAR | Status: DC | PRN
  Administered 2020-06-16 (×2): 50 ug via INTRAVENOUS

## 2020-06-16 MED ORDER — 0.9 % SODIUM CHLORIDE (POUR BTL) OPTIME
TOPICAL | Status: DC | PRN
  Administered 2020-06-16: 1000 mL

## 2020-06-16 MED ORDER — VALSARTAN-HYDROCHLOROTHIAZIDE 320-12.5 MG PO TABS: 1.0000 | ORAL_TABLET | Freq: Every day | ORAL | Status: DC

## 2020-06-16 MED ORDER — SPIRONOLACTONE 25 MG PO TABS
25.0000 mg | ORAL_TABLET | Freq: Every day | ORAL | Status: DC
  Administered 2020-06-17: 25 mg via ORAL
  Filled 2020-06-16: qty 1

## 2020-06-16 MED ORDER — PROPOFOL 500 MG/50ML IV EMUL
INTRAVENOUS | Status: DC | PRN
  Administered 2020-06-16: 75 ug/kg/min via INTRAVENOUS

## 2020-06-16 MED ORDER — ATORVASTATIN CALCIUM 40 MG PO TABS
40.0000 mg | ORAL_TABLET | Freq: Every day | ORAL | Status: DC
  Administered 2020-06-17: 40 mg via ORAL
  Filled 2020-06-16: qty 1

## 2020-06-16 MED ORDER — CHLORHEXIDINE GLUCONATE 0.12 % MT SOLN
15.0000 mL | Freq: Once | OROMUCOSAL | Status: AC
  Administered 2020-06-16: 15 mL via OROMUCOSAL

## 2020-06-16 MED ORDER — GABAPENTIN 300 MG PO CAPS
300.0000 mg | ORAL_CAPSULE | Freq: Three times a day (TID) | ORAL | Status: DC
  Administered 2020-06-16 – 2020-06-17 (×3): 300 mg via ORAL
  Filled 2020-06-16 (×3): qty 1

## 2020-06-16 MED ORDER — PROPOFOL 10 MG/ML IV BOLUS
INTRAVENOUS | Status: AC
  Filled 2020-06-16: qty 40

## 2020-06-16 MED ORDER — ONDANSETRON HCL 4 MG/2ML IJ SOLN
INTRAMUSCULAR | Status: AC
  Filled 2020-06-16: qty 4

## 2020-06-16 MED ORDER — CEFAZOLIN SODIUM-DEXTROSE 2-4 GM/100ML-% IV SOLN
2.0000 g | INTRAVENOUS | Status: AC
  Administered 2020-06-16: 2 g via INTRAVENOUS
  Filled 2020-06-16: qty 100

## 2020-06-16 MED ORDER — DULOXETINE HCL 60 MG PO CPEP
60.0000 mg | ORAL_CAPSULE | Freq: Every day | ORAL | Status: DC
  Administered 2020-06-17: 60 mg via ORAL
  Filled 2020-06-16: qty 1

## 2020-06-16 MED ORDER — HYDROCODONE-ACETAMINOPHEN 10-325 MG PO TABS
1.0000 | ORAL_TABLET | Freq: Three times a day (TID) | ORAL | Status: DC
  Administered 2020-06-16 – 2020-06-17 (×3): 1 via ORAL
  Filled 2020-06-16 (×3): qty 1

## 2020-06-16 MED ORDER — PHENYLEPHRINE HCL-NACL 10-0.9 MG/250ML-% IV SOLN
INTRAVENOUS | Status: DC | PRN
Start: 2020-06-16 — End: 2020-06-16
  Administered 2020-06-16: 40 ug/min via INTRAVENOUS

## 2020-06-16 MED ORDER — MIDAZOLAM HCL 5 MG/5ML IJ SOLN
INTRAMUSCULAR | Status: DC | PRN
  Administered 2020-06-16: 2 mg via INTRAVENOUS

## 2020-06-16 MED ORDER — MAGNESIUM CITRATE PO SOLN: 1.0000 | Freq: Once | ORAL | Status: DC | PRN

## 2020-06-16 MED ORDER — BUPIVACAINE IN DEXTROSE 0.75-8.25 % IT SOLN
INTRATHECAL | Status: DC | PRN
  Administered 2020-06-16: 1.8 mL via INTRATHECAL

## 2020-06-16 MED ORDER — PROPOFOL 10 MG/ML IV BOLUS
INTRAVENOUS | Status: DC | PRN
  Administered 2020-06-16: 10 mg via INTRAVENOUS

## 2020-06-16 MED ORDER — IRBESARTAN 150 MG PO TABS
300.0000 mg | ORAL_TABLET | Freq: Every day | ORAL | Status: DC
  Administered 2020-06-17: 300 mg via ORAL
  Filled 2020-06-16: qty 2

## 2020-06-16 MED ORDER — DOCUSATE SODIUM 100 MG PO CAPS
100.0000 mg | ORAL_CAPSULE | Freq: Two times a day (BID) | ORAL | Status: DC
  Administered 2020-06-16 – 2020-06-17 (×2): 100 mg via ORAL
  Filled 2020-06-16 (×2): qty 1

## 2020-06-16 MED ORDER — FENTANYL CITRATE (PF) 100 MCG/2ML IJ SOLN
INTRAMUSCULAR | Status: AC
  Filled 2020-06-16: qty 2

## 2020-06-16 MED ORDER — PHENYLEPHRINE HCL-NACL 10-0.9 MG/250ML-% IV SOLN
INTRAVENOUS | Status: AC
  Filled 2020-06-16: qty 500

## 2020-06-16 MED ORDER — SODIUM CHLORIDE 0.9 % IV SOLN
INTRAVENOUS | Status: DC | PRN
Start: 2020-06-16 — End: 2020-06-16

## 2020-06-16 SURGICAL SUPPLY — 43 items
BAG DECANTER FOR FLEXI CONT (MISCELLANEOUS) IMPLANT
BAG ZIPLOCK 12X15 (MISCELLANEOUS) IMPLANT
BLADE SAG 18X100X1.27 (BLADE) ×2 IMPLANT
COVER PERINEAL POST (MISCELLANEOUS) ×2 IMPLANT
COVER SURGICAL LIGHT HANDLE (MISCELLANEOUS) ×2 IMPLANT
COVER WAND RF STERILE (DRAPES) IMPLANT
CUP ACET PINNACLE SECTR 56MM (Hips) ×1 IMPLANT
DECANTER SPIKE VIAL GLASS SM (MISCELLANEOUS) ×2 IMPLANT
DRAPE STERI IOBAN 125X83 (DRAPES) ×2 IMPLANT
DRAPE U-SHAPE 47X51 STRL (DRAPES) ×4 IMPLANT
DRSG AQUACEL AG ADV 3.5X10 (GAUZE/BANDAGES/DRESSINGS) ×2 IMPLANT
DURAPREP 26ML APPLICATOR (WOUND CARE) ×2 IMPLANT
ELECT REM PT RETURN 15FT ADLT (MISCELLANEOUS) ×2 IMPLANT
EVACUATOR 1/8 PVC DRAIN (DRAIN) IMPLANT
GLOVE SRG 8 PF TXTR STRL LF DI (GLOVE) ×1 IMPLANT
GLOVE SURG ENC MOIS LTX SZ6 (GLOVE) ×12 IMPLANT
GLOVE SURG ENC MOIS LTX SZ7 (GLOVE) ×2 IMPLANT
GLOVE SURG ENC MOIS LTX SZ8 (GLOVE) ×2 IMPLANT
GLOVE SURG ENC TEXT LTX SZ7 (GLOVE) IMPLANT
GLOVE SURG UNDER POLY LF SZ6.5 (GLOVE) IMPLANT
GLOVE SURG UNDER POLY LF SZ8 (GLOVE) ×1
GLOVE SURG UNDER POLY LF SZ8.5 (GLOVE) IMPLANT
GOWN STRL REUS W/TWL LRG LVL3 (GOWN DISPOSABLE) ×8 IMPLANT
GOWN STRL REUS W/TWL XL LVL3 (GOWN DISPOSABLE) IMPLANT
HEAD CERAMIC DELTA 36 PLUS 1.5 (Hips) ×2 IMPLANT
HOLDER FOLEY CATH W/STRAP (MISCELLANEOUS) ×2 IMPLANT
KIT TURNOVER KIT A (KITS) ×2 IMPLANT
LINER MARATHON 4 NEUTRAL 36X56 (Hips) ×2 IMPLANT
MANIFOLD NEPTUNE II (INSTRUMENTS) ×2 IMPLANT
PACK ANTERIOR HIP CUSTOM (KITS) ×2 IMPLANT
PENCIL SMOKE EVACUATOR COATED (MISCELLANEOUS) ×2 IMPLANT
PINNACLE SECTOR CUP 56MM (Hips) ×2 IMPLANT
STEM FEM ACTIS STD SZ7 (Nail) ×2 IMPLANT
STRIP CLOSURE SKIN 1/2X4 (GAUZE/BANDAGES/DRESSINGS) ×2 IMPLANT
SUT ETHIBOND NAB CT1 #1 30IN (SUTURE) ×2 IMPLANT
SUT MNCRL AB 4-0 PS2 18 (SUTURE) ×2 IMPLANT
SUT STRATAFIX 0 PDS 27 VIOLET (SUTURE) ×2
SUT VIC AB 2-0 CT1 27 (SUTURE) ×2
SUT VIC AB 2-0 CT1 TAPERPNT 27 (SUTURE) ×2 IMPLANT
SUTURE STRATFX 0 PDS 27 VIOLET (SUTURE) ×1 IMPLANT
SYR 50ML LL SCALE MARK (SYRINGE) ×2 IMPLANT
TRAY FOLEY MTR SLVR 16FR STAT (SET/KITS/TRAYS/PACK) ×2 IMPLANT
TUBE SUCTION HIGH CAP CLEAR NV (SUCTIONS) ×2 IMPLANT

## 2020-06-16 NOTE — Discharge Instructions (Signed)
Evan Arabian, MD Total Joint Specialist [facility] [place] [address], [address] [place], [place] [address] [phone]  ANTERIOR APPROACH TOTAL HIP REPLACEMENT POSTOPERATIVE DIRECTIONS     Hip Rehabilitation, Guidelines Following Surgery  The results of a hip operation are greatly improved after range of motion and muscle strengthening exercises. Follow all safety measures which are given to protect your hip. If any of these exercises cause increased pain or swelling in your joint, decrease the amount until you are comfortable again. Then slowly increase the exercises. Call your caregiver if you have problems or questions.   BLOOD CLOT PREVENTION . Take a 325 mg Aspirin two times a day for three weeks following surgery. Then resume one 81 mg Aspirin once a day. Dennis Bast may resume your vitamins/supplements upon discharge from the hospital. . Do not take any NSAIDs (Advil, Aleve, Ibuprofen, Meloxicam, etc.) until you have discontinued the 325 mg Aspirin.  HOME CARE INSTRUCTIONS  . Remove items at home which could result in a fall. This includes throw rugs or furniture in walking pathways.   ICE to the affected hip as frequently as 20-30 minutes an hour and then as needed for pain and swelling. Continue to use ice on the hip for pain and swelling from surgery. You may notice swelling that will progress down to the foot and ankle. This is normal after surgery. Elevate the leg when you are not up walking on it.    Continue to use the breathing machine which will help keep your temperature down.  It is common for your temperature to cycle up and down following surgery, especially at night when you are not up moving around and exerting yourself.  The breathing machine keeps your lungs expanded and your temperature down.  DIET You may resume your previous home diet once your are discharged from the hospital.  DRESSING / WOUND CARE / SHOWERING . You have an adhesive waterproof  bandage over the incision. Leave this in place until your first follow-up appointment. Once you remove this you will not need to place another bandage.  . You may begin showering 3 days following surgery, but do not submerge the incision under water.  ACTIVITY . For the first 3-5 days, it is important to rest and keep the operative leg elevated. You should, as a general rule, rest for 50 minutes and walk/stretch for 10 minutes per hour. After 5 days, you may slowly increase activity as tolerated.  [name] Perform the exercises you were provided twice a day for about 15-20 minutes each session. Begin these 2 days following surgery. . Walk with your walker as instructed. Use the walker until you are comfortable transitioning to a cane. Walk with the cane in the opposite hand of the operative leg. You may discontinue the cane once you are comfortable and walking steadily. . Avoid periods of inactivity such as sitting longer than an hour when not asleep. This helps prevent blood clots.  . Do not drive a car for 6 weeks or until released by your surgeon.  . Do not drive while taking narcotics.  TED HOSE STOCKINGS Wear the elastic stockings on both legs for three weeks following surgery during the day. You may remove them at night while sleeping.  WEIGHT BEARING Weight bearing as tolerated with assist device (walker, cane, etc) as directed, use it as long as suggested by your surgeon or therapist, typically at least 4-6 weeks.  POSTOPERATIVE CONSTIPATION PROTOCOL Constipation - defined medically as fewer than  three stools per week and severe constipation as less than one stool per week.  One of the most common issues patients have following surgery is constipation.  Even if you have a regular bowel pattern at home, your normal regimen is likely to be disrupted due to multiple reasons following surgery.  Combination of anesthesia, postoperative narcotics, change in appetite and fluid intake all can affect  your bowels.  In order to avoid complications following surgery, here are some recommendations in order to help you during your recovery period.  . Colace (docusate) - Pick up an over-the-counter form of Colace or another stool softener and take twice a day as long as you are requiring postoperative pain medications.  Take with a full glass of water daily.  If you experience loose stools or diarrhea, hold the colace until you stool forms back up.  If your symptoms do not get better within 1 week or if they get worse, check with your doctor. . Dulcolax (bisacodyl) - Pick up over-the-counter and take as directed by the product packaging as needed to assist with the movement of your bowels.  Take with a full glass of water.  Use this product as needed if not relieved by Colace only.  . MiraLax (polyethylene glycol) - Pick up over-the-counter to have on hand.  MiraLax is a solution that will increase the amount of water in your bowels to assist with bowel movements.  Take as directed and can mix with a glass of water, juice, soda, coffee, or tea.  Take if you go more than two days without a movement.Do not use MiraLax more than once per day. Call your doctor if you are still constipated or irregular after using this medication for 7 days in a row.  If you continue to have problems with postoperative constipation, please contact the office for further assistance and recommendations.  If you experience "the worst abdominal pain ever" or develop nausea or vomiting, please contact the office immediatly for further recommendations for treatment.  ITCHING  If you experience itching with your medications, try taking only a single pain pill, or even half a pain pill at a time.  You can also use Benadryl over the counter for itching or also to help with sleep.   MEDICATIONS See your medication summary on the "After Visit Summary" that the nursing staff will review with you prior to discharge.  You may have some home  medications which will be placed on hold until you complete the course of blood thinner medication.  It is important for you to complete the blood thinner medication as prescribed by your surgeon.  Continue your approved medications as instructed at time of discharge.  PRECAUTIONS If you experience chest pain or shortness of breath - call 911 immediately for transfer to the hospital emergency department.  If you develop a fever greater that 101 F, purulent drainage from wound, increased redness or drainage from wound, foul odor from the wound/dressing, or calf pain - CONTACT YOUR SURGEON.                                                   FOLLOW-UP APPOINTMENTS Make sure you keep all of your appointments after your operation with your surgeon and caregivers. You should call the office at the above phone number and make an appointment for approximately  two weeks after the date of your surgery or on the date instructed by your surgeon outlined in the "After Visit Summary".  RANGE OF MOTION AND STRENGTHENING EXERCISES  These exercises are designed to help you keep full movement of your hip joint. Follow your caregiver's or physical therapist's instructions. Perform all exercises about fifteen times, three times per day or as directed. Exercise both hips, even if you have had only one joint replacement. These exercises can be done on a training (exercise) mat, on the floor, on a table or on a bed. Use whatever works the best and is most comfortable for you. Use music or television while you are exercising so that the exercises are a pleasant break in your day. This will make your life better with the exercises acting as a break in routine you can look forward to.  . Lying on your back, slowly slide your foot toward your buttocks, raising your knee up off the floor. Then slowly slide your foot back down until your leg is straight again.  . Lying on your back spread your legs as far apart as you can without  causing discomfort.  . Lying on your side, raise your upper leg and foot straight up from the floor as far as is comfortable. Slowly lower the leg and repeat.  . Lying on your back, tighten up the muscle in the front of your thigh (quadriceps muscles). You can do this by keeping your leg straight and trying to raise your heel off the floor. This helps strengthen the largest muscle supporting your knee.  . Lying on your back, tighten up the muscles of your buttocks both with the legs straight and with the knee bent at a comfortable angle while keeping your heel on the floor.   IF YOU ARE TRANSFERRED TO A SKILLED REHAB FACILITY If the patient is transferred to a skilled rehab facility following release from the hospital, a list of the current medications will be sent to the facility for the patient to continue.  When discharged from the skilled rehab facility, please have the facility set up the patient's [place] prior to being released. Also, the skilled facility will be responsible for providing the patient with their medications at time of release from the facility to include their pain medication, the muscle relaxants, and their blood thinner medication. If the patient is still at the rehab facility at time of the two week follow up appointment, the skilled rehab facility will also need to assist the patient in arranging follow up appointment in our office and any transportation needs.  MAKE SURE YOU:  . Understand these instructions.  . Get help right away if you are not doing well or get worse.    DENTAL ANTIBIOTICS:  In most cases prophylactic antibiotics for Dental procdeures after total joint surgery are not necessary.  Exceptions are as follows:  1. History of prior total joint infection  2. Severely immunocompromised (Organ Transplant, cancer chemotherapy, Rheumatoid biologic meds such as [place])  3. Poorly controlled diabetes (A1C &gt; 8.0, blood glucose over  200)  If you have one of these conditions, contact your surgeon for an antibiotic prescription, prior to your dental procedure.    Pick up stool softner and laxative for home use following surgery while on pain medications. Do not submerge incision under water. Please use good hand washing techniques while changing dressing each day. May shower starting three days after surgery. Please use a clean towel to  pat the incision dry following showers. Continue to use ice for pain and swelling after surgery. Do not use any lotions or creams on the incision until instructed by your surgeon.

## 2020-06-16 NOTE — Anesthesia Postprocedure Evaluation (Signed)
Anesthesia Post Note  Patient: Evan Rivas  Procedure(s) Performed: TOTAL HIP ARTHROPLASTY ANTERIOR APPROACH (Right Hip)     Patient location during evaluation: PACU Anesthesia Type: Spinal Level of consciousness: oriented and awake and alert Pain management: pain level controlled Vital Signs Assessment: post-procedure vital signs reviewed and stable Respiratory status: spontaneous breathing, respiratory function stable and patient connected to nasal cannula oxygen Cardiovascular status: blood pressure returned to baseline and stable Postop Assessment: no headache, no backache and no apparent nausea or vomiting Anesthetic complications: no   No complications documented.  Last Vitals:  Vitals:   06/16/20 1326 06/16/20 1551  BP: 137/80 (!) 145/73  Pulse: 73 93  Resp: 16 16  Temp: 36.4 C 36.5 C  SpO2: 94% 95%    Last Pain:  Vitals:   06/16/20 1551  TempSrc: Oral  PainSc:                  Carrol Hougland S

## 2020-06-16 NOTE — Anesthesia Preprocedure Evaluation (Signed)
Anesthesia Evaluation  Patient identified by MRN, date of birth, ID band Patient awake    Reviewed: Allergy & Precautions, NPO status , Patient's Chart, lab work & pertinent test results  Airway Mallampati: II  TM Distance: >3 FB Neck ROM: Full    Dental no notable dental hx.    Pulmonary sleep apnea ,    Pulmonary exam normal breath sounds clear to auscultation       Cardiovascular hypertension, Pt. on medications and Pt. on home beta blockers Normal cardiovascular exam Rhythm:Regular Rate:Normal     Neuro/Psych negative neurological ROS  negative psych ROS   GI/Hepatic Neg liver ROS, GERD  ,  Endo/Other  diabetes, Type 2Hyperaldosteronism (HCC  Renal/GU Renal InsufficiencyRenal disease  negative genitourinary   Musculoskeletal negative musculoskeletal ROS (+)   Abdominal   Peds negative pediatric ROS (+)  Hematology negative hematology ROS (+)   Anesthesia Other Findings   Reproductive/Obstetrics negative OB ROS                             Anesthesia Physical Anesthesia Plan  ASA: III  Anesthesia Plan: Spinal   Post-op Pain Management:    Induction: Intravenous  PONV Risk Score and Plan: 2 and Ondansetron and Dexamethasone  Airway Management Planned: Simple Face Mask  Additional Equipment:   Intra-op Plan:   Post-operative Plan:   Informed Consent: I have reviewed the patients History and Physical, chart, labs and discussed the procedure including the risks, benefits and alternatives for the proposed anesthesia with the patient or authorized representative who has indicated his/her understanding and acceptance.     Dental advisory given  Plan Discussed with: CRNA and Surgeon  Anesthesia Plan Comments:         Anesthesia Quick Evaluation

## 2020-06-16 NOTE — Evaluation (Signed)
Physical Therapy Evaluation Patient Details Name: Evan Rivas MRN: [id number] DOB: [birth year] Today's Date: 06/16/2020   History of Present Illness  patient is a 70 y.o. male s/p Rt THA on 06/16/2020 with PMH significant for HTN, HLD, GERD, DM, hyperaldosteronism, depression, anxiety, and B TKA (both in 2018).  Clinical Impression  Pt is a 70y.o. male s/p Rt THA POD 0. Pt reports that he is modified independent with use of cane for mobility at baseline. Pt required MIN guard with cues for safe hand placement for sit to stand transfer. Pt required MIN assist progressing to MIN guard for safety with ambulation 58ft with verbal cues for step to gait pattern with no LOB. PT reviewed therapeutic intervention for promotion of DVT prevention, pt demonstrated understanding. Pt will have assistance from his wife and other family members upon discharge. Pt will benefit from skilled PT to increase independence and safety with mobility. Acute therapy to follow up during stay to progress functional mobility as able to ensure safe discharge home.       Follow Up Recommendations Follow surgeon's recommendation for DC plan and follow-up therapies    Equipment Recommendations  None recommended by PT (pt owns RW)    Recommendations for Other Services       Precautions / Restrictions Precautions Precautions: Fall Restrictions Weight Bearing Restrictions: No Other Position/Activity Restrictions: WBAT      Mobility  Bed Mobility Overal bed mobility: Needs Assistance Bed Mobility: Supine to Sit     Supine to sit: Supervision;HOB elevated     General bed mobility comments: pt with use of B UEs to scoot to EOB with supervision for safety.    Transfers Overall transfer level: Needs assistance Equipment used: Rolling walker (2 wheeled) Transfers: Sit to/from Stand Sit to Stand: Min guard;From elevated surface         General transfer comment: MIN guard for safety with cues for safe hand  placement  Ambulation/Gait Ambulation/Gait assistance: Min assist;Min guard Gait Distance (Feet): 50 Feet Assistive device: Rolling walker (2 wheeled) Gait Pattern/deviations: Step-to pattern;Decreased stride length;Decreased weight shift to right Gait velocity: decr   General Gait Details: Pt performed pre gait marching with use of B UEs on RW. MIN assist progressing to MIN guard for safety with cues for step to gait pattern with no LOB.  Stairs            Wheelchair Mobility    Modified Rankin (Stroke Patients Only)       Balance Overall balance assessment: Needs assistance Sitting-balance support: Feet supported Sitting balance-Leahy Scale: Good     Standing balance support: Bilateral upper extremity supported;During functional activity Standing balance-Leahy Scale: Poor Standing balance comment: use of RW to maintain balance. Pt was able to maintain standing balance with single UE support on RW.                             Pertinent Vitals/Pain Pain Assessment: 0-10 Pain Score: 8  Pain Location: Rt hip Pain Descriptors / Indicators: Pressure;Throbbing Pain Intervention(s): Limited activity within patient's tolerance;Monitored during session;Repositioned;Ice applied    Home Living Family/patient expects to be discharged to:: Private residence Living Arrangements: Spouse/significant other Available Help at Discharge: Family Type of Home: House Home Access: Other (comment) (small threshold less than 6")     Home Layout: One level Home Equipment: Cane - single point;Bedside commode;Walker - 2 wheels Additional Comments: Pt's wife is available to assist at home and  his daughter and her husband live close.    Prior Function Level of Independence: Independent with assistive device(s)         Comments: use of cane     Hand Dominance   Dominant Hand: Right    Extremity/Trunk Assessment   Upper Extremity Assessment Upper Extremity  Assessment: Overall WFL for tasks assessed    Lower Extremity Assessment Lower Extremity Assessment: RLE deficits/detail RLE Deficits / Details: pt with good Rt quad set strength and 4+/5 B dorsi/plantar flexion strength. RLE Sensation: WNL RLE Coordination: WNL    Cervical / Trunk Assessment Cervical / Trunk Assessment: Normal  Communication   Communication: No difficulties  Cognition Arousal/Alertness: Awake/alert Behavior During Therapy: WFL for tasks assessed/performed Overall Cognitive Status: Within Functional Limits for tasks assessed                                        General Comments      Exercises Total Joint Exercises Ankle Circles/Pumps: AROM;Both;20 reps;Seated   Assessment/Plan    PT Assessment Patient needs continued PT services  PT Problem List Decreased strength;Decreased range of motion;Decreased activity tolerance;Decreased balance;Decreased mobility;Decreased knowledge of use of DME;Pain       PT Treatment Interventions DME instruction;Gait training;Stair training;Functional mobility training;Therapeutic activities;Therapeutic exercise;Balance training;Patient/family education    PT Goals (Current goals can be found in the Care Plan section)  Acute Rehab PT Goals Patient Stated Goal: get back to golfing and going to the pool with his granddaughter PT Goal Formulation: With patient/family Time For Goal Achievement: 06/23/20 Potential to Achieve Goals: Good    Frequency 7X/week   Barriers to discharge        Co-evaluation               AM-PAC PT "6 Clicks" Mobility  Outcome Measure Help needed turning from your back to your side while in a flat bed without using bedrails?: None Help needed moving from lying on your back to sitting on the side of a flat bed without using bedrails?: None Help needed moving to and from a bed to a chair (including a wheelchair)?: A Little Help needed standing up from a chair using your  arms (e.g., wheelchair or bedside chair)?: A Little Help needed to walk in hospital room?: A Little Help needed climbing 3-5 steps with a railing? : A Little 6 Click Score: 20    End of Session Equipment Utilized During Treatment: Gait belt Activity Tolerance: Patient tolerated treatment well Patient left: in chair;with call bell/phone within reach;with chair alarm set;with family/visitor present Nurse Communication: Mobility status PT Visit Diagnosis: Unsteadiness on feet (R26.81);Muscle weakness (generalized) (M62.81);Pain Pain - Right/Left: Right Pain - part of body: Hip    Time: 1709-1730 PT Time Calculation (min) (ACUTE ONLY): 21 min   Charges:              Elna Breslow, SPT  Acute rehab   Elna Breslow 06/16/2020, 6:26 PM

## 2020-06-16 NOTE — Care Plan (Signed)
Ortho Bundle Case Management Note  Patient Details  Name: Evan Rivas MRN: [id number] Date of Birth: Aug 02, 1950                  R THA on 06/30/20. DCP: Home with spouse. 1 story home with 0 steps. DME: No needs. Has RW & 3in1. PT: HEP   DME Arranged:  N/A DME Agency:     HH Arranged:    [place] Agency:     Additional Comments: Please contact me with any questions of if this plan should need to change.  Marianne Sofia, RN,CCM [facility]  [phone] 06/16/2020, 9:11 AM

## 2020-06-16 NOTE — Transfer of Care (Signed)
Immediate Anesthesia Transfer of Care Note  Patient: Evan Rivas  Procedure(s) Performed: Procedure(s) with comments: TOTAL HIP ARTHROPLASTY ANTERIOR APPROACH (Right) - 146min  Patient Location: PACU  Anesthesia Type:Spinal  Level of Consciousness:  sedated, patient cooperative and responds to stimulation  Airway & Oxygen Therapy:Patient Spontanous Breathing and Patient connected to face mask oxgen  Post-op Assessment:  Report given to PACU RN and Post -op Vital signs reviewed and stable  Post vital signs:  Reviewed and stable  Last Vitals:  Vitals:   06/16/20 0802  BP: (!) 168/95  Pulse: 91  Resp: 18  Temp: 36.6 C  SpO2: 04%    Complications: No apparent anesthesia complications

## 2020-06-16 NOTE — Op Note (Signed)
OPERATIVE REPORT- TOTAL HIP ARTHROPLASTY   PREOPERATIVE DIAGNOSIS: Osteoarthritis of the Right hip.   POSTOPERATIVE DIAGNOSIS: Osteoarthritis of the Right  hip.   PROCEDURE: Right total hip arthroplasty, anterior approach.   SURGEON: Gaynelle Arabian, MD   ASSISTANT: Theresa Duty, PA-C  ANESTHESIA:  Spinal  ESTIMATED BLOOD LOSS:-250 mL    DRAINS: Hemovac x1.   COMPLICATIONS: None   CONDITION: PACU - hemodynamically stable.   BRIEF CLINICAL NOTE: Evan Rivas is a 70 y.o. male who has advanced end-  stage arthritis of their Right  hip with progressively worsening pain and  dysfunction.The patient has failed nonoperative management and presents for  total hip arthroplasty.   PROCEDURE IN DETAIL: After successful administration of spinal  anesthetic, the traction boots for the [facility] bed were placed on both  feet and the patient was placed onto the [facility] bed, boots placed into the leg  holders. The Right hip was then isolated from the perineum with plastic  drapes and prepped and draped in the usual sterile fashion. ASIS and  greater trochanter were marked and a oblique incision was made, starting  at about 1 cm lateral and 2 cm distal to the ASIS and coursing towards  the anterior cortex of the femur. The skin was cut with a 10 blade  through subcutaneous tissue to the level of the fascia overlying the  tensor fascia lata muscle. The fascia was then incised in line with the  incision at the junction of the anterior third and posterior 2/3rd. The  muscle was teased off the fascia and then the interval between the TFL  and the rectus was developed. The Hohmann retractor was then placed at  the top of the femoral neck over the capsule. The vessels overlying the  capsule were cauterized and the fat on top of the capsule was removed.  A Hohmann retractor was then placed anterior underneath the rectus  femoris to give exposure to the entire anterior capsule. A T-shaped   capsulotomy was performed. The edges were tagged and the femoral head  was identified.       Osteophytes are removed off the superior acetabulum.  The femoral neck was then cut in situ with an oscillating saw. Traction  was then applied to the left lower extremity utilizing the [facility]  traction. The femoral head was then removed. Retractors were placed  around the acetabulum and then circumferential removal of the labrum was  performed. Osteophytes were also removed. Reaming starts at 49 mm to  medialize and  Increased in 2 mm increments to 55 mm. We reamed in  approximately 40 degrees of abduction, 20 degrees anteversion. A 56 mm  pinnacle acetabular shell was then impacted in anatomic position under  fluoroscopic guidance with excellent purchase. We did not need to place  any additional dome screws. A 36 mm neutral + 4 marathon liner was then  placed into the acetabular shell.       The femoral lift was then placed along the lateral aspect of the femur  just distal to the vastus ridge. The leg was  externally rotated and capsule  was stripped off the inferior aspect of the femoral neck down to the  level of the lesser trochanter, this was done with electrocautery. The femur was lifted after this was performed. The  leg was then placed in an extended and adducted position essentially delivering the femur. We also removed the capsule superiorly and the piriformis from the piriformis  fossa to gain excellent exposure of the  proximal femur. Rongeur was used to remove some cancellous bone to get  into the lateral portion of the proximal femur for placement of the  initial starter reamer. The starter broaches was placed  the starter broach  and was shown to go down the center of the canal. Broaching  with the Actis system was then performed starting at size 0  coursing  Up to size 7. A size 7 had excellent torsional and rotational  and axial stability. The trial standard offset neck was then  placed  with a 36 + 1.5  trial head. The hip was then reduced. We confirmed that  the stem was in the canal both on AP and lateral x-rays. It also has excellent sizing. The hip was reduced with outstanding stability through full extension and full external rotation.. AP pelvis was taken and the leg lengths were measured and found to be equal. Hip was then dislocated again and the femoral head and neck removed. The  femoral broach was removed. Size 7 Actis stem with a standard offset  neck was then impacted into the femur following native anteversion. Has  excellent purchase in the canal. Excellent torsional and rotational and  axial stability. It is confirmed to be in the canal on AP and lateral  fluoroscopic views. The 36 + 1.5 ceramic head was placed and the hip  reduced with outstanding stability. Again AP pelvis was taken and it  confirmed that the leg lengths were equal. The wound was then copiously  irrigated with saline solution and the capsule reattached and repaired  with Ethibond suture. 30 ml of .25% Bupivicaine was  injected into the capsule and into the edge of the tensor fascia lata as well as subcutaneous tissue. The fascia overlying the tensor fascia lata was then closed with a running #1 V-Loc. Subcu was closed with interrupted 2-0 Vicryl and subcuticular running 4-0 Monocryl. Incision was cleaned  and dried. Steri-Strips and a bulky sterile dressing applied. Hemovac  drain was hooked to suction and then the patient was awakened and transported to  recovery in stable condition.        Please note that a surgical assistant was a medical necessity for this procedure to perform it in a safe and expeditious manner. Assistant was necessary to provide appropriate retraction of vital neurovascular structures and to prevent femoral fracture and allow for anatomic placement of the prosthesis.  Gaynelle Arabian, M.D.

## 2020-06-16 NOTE — Anesthesia Procedure Notes (Signed)
Spinal  Patient location during procedure: OR Start time: 06/16/2020 10:01 AM End time: 06/16/2020 10:06 AM Reason for block: surgical anesthesia Staffing Anesthesiologist: Myrtie Soman, MD Preanesthetic Checklist Completed: patient identified, IV checked, site marked, risks and benefits discussed, surgical consent, monitors and equipment checked, pre-op evaluation and timeout performed Spinal Block Patient position: sitting Prep: DuraPrep Patient monitoring: heart rate, cardiac monitor, continuous pulse ox and blood pressure Approach: midline Location: L3-4 Injection technique: single-shot Needle Needle type: Sprotte  Needle gauge: 24 G Needle length: 9 cm Assessment Sensory level: T6 Events: CSF return

## 2020-06-16 NOTE — Interval H&P Note (Signed)
History and Physical Interval Note:  06/16/2020 8:12 AM  Evan Rivas  has presented today for surgery, with the diagnosis of right hip osteoarthritis.  The various methods of treatment have been discussed with the patient and family. After consideration of risks, benefits and other options for treatment, the patient has consented to  Procedure(s) with comments: Crystal (Right) - 171min as a surgical intervention.  The patient's history has been reviewed, patient examined, no change in status, stable for surgery.  I have reviewed the patient's chart and labs.  Questions were answered to the patient's satisfaction.     Pilar Plate Lauryl Seyer

## 2020-06-17 ENCOUNTER — Encounter (HOSPITAL_COMMUNITY): Payer: Self-pay | Admitting: Orthopedic Surgery

## 2020-06-17 DIAGNOSIS — M1611 Unilateral primary osteoarthritis, right hip: Secondary | ICD-10-CM | POA: Diagnosis not present

## 2020-06-17 LAB — CBC
HCT: 35 % — ABNORMAL LOW (ref 39.0–52.0)
Hemoglobin: 11.1 g/dL — ABNORMAL LOW (ref 13.0–17.0)
MCH: 31.9 pg (ref 26.0–34.0)
MCHC: 31.7 g/dL (ref 30.0–36.0)
MCV: 100.6 fL — ABNORMAL HIGH (ref 80.0–100.0)
Platelets: 201 10*3/uL (ref 150–400)
RBC: 3.48 MIL/uL — ABNORMAL LOW (ref 4.22–5.81)
RDW: 12.9 % (ref 11.5–15.5)
WBC: 9 10*3/uL (ref 4.0–10.5)
nRBC: 0 % (ref 0.0–0.2)

## 2020-06-17 LAB — BASIC METABOLIC PANEL
Anion gap: 11 (ref 5–15)
BUN: 29 mg/dL — ABNORMAL HIGH (ref 8–23)
CO2: 21 mmol/L — ABNORMAL LOW (ref 22–32)
Calcium: 9 mg/dL (ref 8.9–10.3)
Chloride: 107 mmol/L (ref 98–111)
Creatinine, Ser: 1.24 mg/dL (ref 0.61–1.24)
GFR, Estimated: >60 mL/min (ref >60)
Glucose, Bld: 224 mg/dL — ABNORMAL HIGH (ref 70–99)
Potassium: 5.3 mmol/L — ABNORMAL HIGH (ref 3.5–5.1)
Sodium: 139 mmol/L (ref 135–145)

## 2020-06-17 LAB — GLUCOSE, CAPILLARY: Glucose-Capillary: 185 mg/dL — ABNORMAL HIGH (ref 70–99)

## 2020-06-17 MED ORDER — OXYCODONE HCL 5 MG PO TABS: 5.0000 mg | ORAL_TABLET | Freq: Four times a day (QID) | ORAL | 0 refills | Status: DC | PRN

## 2020-06-17 MED ORDER — ASPIRIN 325 MG PO TBEC: 325.0000 mg | DELAYED_RELEASE_TABLET | Freq: Two times a day (BID) | ORAL | 0 refills | Status: DC

## 2020-06-17 MED ORDER — METHOCARBAMOL 500 MG PO TABS: 500.0000 mg | ORAL_TABLET | Freq: Four times a day (QID) | ORAL | 0 refills | Status: DC | PRN

## 2020-06-17 NOTE — Plan of Care (Signed)
  Problem: Education: Goal: Knowledge of General Education information will improve Description: Including pain rating scale, medication(s)/side effects and non-pharmacologic comfort measures Outcome: Adequate for Discharge   Problem: Health Behavior/Discharge Planning: Goal: Ability to manage health-related needs will improve Outcome: Adequate for Discharge   Problem: Clinical Measurements: Goal: Ability to maintain clinical measurements within normal limits will improve Outcome: Adequate for Discharge Goal: Will remain free from infection Outcome: Adequate for Discharge Goal: Diagnostic test results will improve Outcome: Adequate for Discharge Goal: Respiratory complications will improve Outcome: Adequate for Discharge Goal: Cardiovascular complication will be avoided Outcome: Adequate for Discharge   Problem: Activity: Goal: Risk for activity intolerance will decrease Outcome: Adequate for Discharge   Problem: Nutrition: Goal: Adequate nutrition will be maintained Outcome: Adequate for Discharge   Problem: Coping: Goal: Level of anxiety will decrease Outcome: Adequate for Discharge   Problem: Elimination: Goal: Will not experience complications related to bowel motility Outcome: Adequate for Discharge Goal: Will not experience complications related to urinary retention Outcome: Adequate for Discharge   Problem: Pain Managment: Goal: General experience of comfort will improve Outcome: Adequate for Discharge   Problem: Safety: Goal: Ability to remain free from injury will improve Outcome: Adequate for Discharge   Problem: Skin Integrity: Goal: Risk for impaired skin integrity will decrease Outcome: Adequate for Discharge   Problem: Education: Goal: Knowledge of the prescribed therapeutic regimen will improve Outcome: Adequate for Discharge Goal: Understanding of discharge needs will improve Outcome: Adequate for Discharge Goal: Individualized Educational  Video(s) Outcome: Adequate for Discharge   Problem: Activity: Goal: Ability to avoid complications of mobility impairment will improve Outcome: Adequate for Discharge Goal: Ability to tolerate increased activity will improve Outcome: Adequate for Discharge   Problem: Clinical Measurements: Goal: Postoperative complications will be avoided or minimized Outcome: Adequate for Discharge   Problem: Pain Management: Goal: Pain level will decrease with appropriate interventions Outcome: Adequate for Discharge   Problem: Skin Integrity: Goal: Will show signs of wound healing Outcome: Adequate for Discharge   Problem: Acute Rehab PT Goals(only PT should resolve) Goal: Patient Will Transfer Sit To/From Stand Outcome: Adequate for Discharge Goal: Pt Will Ambulate Outcome: Adequate for Discharge Goal: Pt/caregiver will Perform Home Exercise Program Outcome: Adequate for Discharge

## 2020-06-17 NOTE — Progress Notes (Signed)
Physical Therapy Treatment Patient Details Name: Evan Rivas MRN: [id number] DOB: 07-19-1950 Today's Date: 06/17/2020    History of Present Illness patient is a 70 y.o. male s/p Rt THA on 06/16/2020 with PMH significant for HTN, HLD, GERD, DM, hyperaldosteronism, depression, anxiety, and B TKA (both in 2018).    PT Comments    Pt continues very cooperative but fatigues fairly easily.  Pt up to ambulate limited distance in hall, negotiated step, reviewed car transfers and reviewed HEP with written instruction provided and reviewed.  Pt eager for dc home.   Follow Up Recommendations  Follow surgeon's recommendation for DC plan and follow-up therapies     Equipment Recommendations  None recommended by PT    Recommendations for Other Services       Precautions / Restrictions Precautions Precautions: Fall Restrictions Weight Bearing Restrictions: No Other Position/Activity Restrictions: WBAT    Mobility  Bed Mobility Overal bed mobility: Needs Assistance Bed Mobility: Supine to Sit     Supine to sit: Supervision;HOB elevated     General bed mobility comments: Pt up in chair and requests back to same    Transfers Overall transfer level: Needs assistance Equipment used: Rolling walker (2 wheeled) Transfers: Sit to/from Stand Sit to Stand: Min guard         General transfer comment: cues for LE management and use of UEs to self assist  Ambulation/Gait Ambulation/Gait assistance: Min guard;Supervision Gait Distance (Feet): 55 Feet Assistive device: Rolling walker (2 wheeled) Gait Pattern/deviations: Decreased stride length;Decreased weight shift to right;Step-to pattern;Step-through pattern;Shuffle Gait velocity: decr   General Gait Details: cues for posture, position from RW and initial sequence   Stairs Stairs: Yes Stairs assistance: Min assist Stair Management: No rails Number of Stairs: 1 General stair comments: cues for sequence   Wheelchair  Mobility    Modified Rankin (Stroke Patients Only)       Balance Overall balance assessment: Needs assistance Sitting-balance support: Feet supported Sitting balance-Leahy Scale: Good     Standing balance support: During functional activity;No upper extremity supported Standing balance-Leahy Scale: Fair                              Cognition Arousal/Alertness: Awake/alert Behavior During Therapy: WFL for tasks assessed/performed Overall Cognitive Status: Within Functional Limits for tasks assessed                                        Exercises Total Joint Exercises Ankle Circles/Pumps: AROM;Both;20 reps;Seated Quad Sets: AROM;Both;10 reps;Supine Heel Slides: AAROM;Right;Supine;10 reps Hip ABduction/ADduction: AAROM;Right;Supine;5 reps Long Arc Quad: AAROM;Right;10 reps;Seated    General Comments        Pertinent Vitals/Pain Pain Assessment: 0-10 Pain Score: 6  Pain Location: Rt hip Pain Descriptors / Indicators: Aching;Sore Pain Intervention(s): Limited activity within patient's tolerance;Monitored during session;Premedicated before session;Ice applied    Home Living                      Prior Function            PT Goals (current goals can now be found in the care plan section) Acute Rehab PT Goals Patient Stated Goal: get back to golfing and going to the pool with his granddaughter PT Goal Formulation: With patient/family Time For Goal Achievement: 06/23/20 Potential to Achieve Goals: Good Progress towards PT  goals: Progressing toward goals    Frequency    7X/week      PT Plan Current plan remains appropriate    Co-evaluation              AM-PAC PT "6 Clicks" Mobility   Outcome Measure  Help needed turning from your back to your side while in a flat bed without using bedrails?: A Little Help needed moving from lying on your back to sitting on the side of a flat bed without using bedrails?: A  Little Help needed moving to and from a bed to a chair (including a wheelchair)?: A Little Help needed standing up from a chair using your arms (e.g., wheelchair or bedside chair)?: A Little Help needed to walk in hospital room?: A Little Help needed climbing 3-5 steps with a railing? : A Little 6 Click Score: 18    End of Session Equipment Utilized During Treatment: Gait belt Activity Tolerance: Patient tolerated treatment well;Patient limited by fatigue Patient left: in chair;with call bell/phone within reach;with chair alarm set;with family/visitor present Nurse Communication: Mobility status PT Visit Diagnosis: Unsteadiness on feet (R26.81);Muscle weakness (generalized) (M62.81);Pain Pain - Right/Left: Right Pain - part of body: Hip     Time: 1017-1050 PT Time Calculation (min) (ACUTE ONLY): 33 min  Charges:  $Gait Training: 8-22 mins $Therapeutic Exercise: 8-22 mins $Therapeutic Activity: 8-22 mins                     [place] Pager [phone] Office [phone]    Chawn Spraggins 06/17/2020, 12:44 PM

## 2020-06-17 NOTE — Progress Notes (Signed)
Physical Therapy Treatment Patient Details Name: Evan Rivas MRN: [id number] DOB: 29-Sep-1950 Today's Date: 06/17/2020    History of Present Illness patient is a 70 y.o. male s/p Rt THA on 06/16/2020 with PMH significant for HTN, HLD, GERD, DM, hyperaldosteronism, depression, anxiety, and B TKA (both in 2018).    PT Comments    Pt very cooperative and progressing with functional mobility but fatigues fairly easily with c/o SOB and feeling very hot but denies dizziness - BP 151/75.  HEP initiated.   Follow Up Recommendations  Follow surgeon's recommendation for DC plan and follow-up therapies     Equipment Recommendations  None recommended by PT    Recommendations for Other Services       Precautions / Restrictions Precautions Precautions: Fall Restrictions Weight Bearing Restrictions: No Other Position/Activity Restrictions: WBAT    Mobility  Bed Mobility Overal bed mobility: Needs Assistance Bed Mobility: Supine to Sit     Supine to sit: Supervision;HOB elevated     General bed mobility comments: cues for sequence and use of L LE to self assist.  Pt self assisting R LE with belt.    Transfers Overall transfer level: Needs assistance Equipment used: Rolling walker (2 wheeled) Transfers: Sit to/from Stand Sit to Stand: Min guard;From elevated surface         General transfer comment: MIN guard for safety with cues for safe hand placement  Ambulation/Gait Ambulation/Gait assistance: Min assist;Min guard Gait Distance (Feet): 150 Feet Assistive device: Rolling walker (2 wheeled) Gait Pattern/deviations: Decreased stride length;Decreased weight shift to right;Step-to pattern;Step-through pattern;Shuffle Gait velocity: decr   General Gait Details: cues for posture, position from RW and initial sequence   Stairs             Wheelchair Mobility    Modified Rankin (Stroke Patients Only)       Balance Overall balance assessment: Needs  assistance Sitting-balance support: Feet supported Sitting balance-Leahy Scale: Good     Standing balance support: During functional activity;No upper extremity supported Standing balance-Leahy Scale: Fair                              Cognition Arousal/Alertness: Awake/alert Behavior During Therapy: WFL for tasks assessed/performed Overall Cognitive Status: Within Functional Limits for tasks assessed                                        Exercises Total Joint Exercises Ankle Circles/Pumps: AROM;Both;20 reps;Seated Quad Sets: AROM;Both;10 reps;Supine Heel Slides: AAROM;Right;20 reps;Supine Hip ABduction/ADduction: AAROM;Right;15 reps;Supine Long Arc Quad: AAROM;Right;10 reps;Seated    General Comments        Pertinent Vitals/Pain Pain Assessment: 0-10 Pain Score: 6  Pain Location: Rt hip Pain Descriptors / Indicators: Aching;Sore Pain Intervention(s): Limited activity within patient's tolerance;Monitored during session;Premedicated before session;Ice applied    Home Living                      Prior Function            PT Goals (current goals can now be found in the care plan section) Acute Rehab PT Goals Patient Stated Goal: get back to golfing and going to the pool with his granddaughter PT Goal Formulation: With patient/family Time For Goal Achievement: 06/23/20 Potential to Achieve Goals: Good Progress towards PT goals: Progressing toward goals  Frequency    7X/week      PT Plan Current plan remains appropriate    Co-evaluation              AM-PAC PT "6 Clicks" Mobility   Outcome Measure  Help needed turning from your back to your side while in a flat bed without using bedrails?: A Little Help needed moving from lying on your back to sitting on the side of a flat bed without using bedrails?: A Little Help needed moving to and from a bed to a chair (including a wheelchair)?: A Little Help needed  standing up from a chair using your arms (e.g., wheelchair or bedside chair)?: A Little Help needed to walk in hospital room?: A Little Help needed climbing 3-5 steps with a railing? : A Little 6 Click Score: 18    End of Session Equipment Utilized During Treatment: Gait belt Activity Tolerance: Patient tolerated treatment well Patient left: in chair;with call bell/phone within reach;with chair alarm set;with family/visitor present Nurse Communication: Mobility status PT Visit Diagnosis: Unsteadiness on feet (R26.81);Muscle weakness (generalized) (M62.81);Pain Pain - Right/Left: Right Pain - part of body: Hip     Time: 2831-5176 PT Time Calculation (min) (ACUTE ONLY): 39 min  Charges:  $Gait Training: 8-22 mins $Therapeutic Exercise: 8-22 mins $Therapeutic Activity: 8-22 mins                     [name] PT Acute Rehabilitation Services Pager [phone] Office [phone]    [name] 06/17/2020, 12:34 PM

## 2020-06-17 NOTE — TOC Transition Note (Signed)
Transition of Care [facility]) - CM/SW Discharge Note   Patient Details  Name: Evan Rivas MRN: [id number] Date of Birth: June 14, 1950  [facility] CM/SW Contact:  Lennart Pall, LCSW Phone Number: 06/17/2020, 10:24 AM   Clinical Narrative:    Met briefly with pt and confirming has needed DME.  Plan HEP.  No TOC needs.   Final next level of care: Home/Self Care Barriers to Discharge: No Barriers Identified   Patient Goals and CMS Choice Patient states their goals for this hospitalization and ongoing recovery are:: return home      Discharge Placement                       Discharge Plan and Services                DME Arranged: N/A                    Social Determinants of Health (SDOH) Interventions     Readmission Risk Interventions No flowsheet data found.

## 2020-06-17 NOTE — Progress Notes (Signed)
   Subjective: 1 Day Post-Op Procedure(s) (LRB): TOTAL HIP ARTHROPLASTY ANTERIOR APPROACH (Right) Patient reports pain as mild.   Patient seen in rounds by Dr. Wynelle Link. Patient is well, and has had no acute complaints or problems. No acute overnight events. Ambulated 50 feet with PT yesterday. Denies chest pain, SOB, or calf pain.  We will continue therapy today.   Objective: Vital signs in last 24 hours: Temp:  [97.5 F (36.4 C)-98.6 F (37 C)] 97.9 F (36.6 C) (03/17 0511) Pulse Rate:  [73-105] 82 (03/17 0511) Resp:  [7-18] 18 (03/17 0511) BP: (91-168)/(54-95) 158/84 (03/17 0511) SpO2:  [93 %-100 %] 95 % (03/17 0511) Weight:  [118.8 kg] 118.8 kg (03/16 0826)  Intake/Output from previous day:  Intake/Output Summary (Last 24 hours) at 06/17/2020 0736 Last data filed at 06/17/2020 0726 Gross per 24 hour  Intake 4668.59 ml  Output 3825 ml  Net 843.59 ml     Intake/Output this shift: Total I/O In: -  Out: 200 [Urine:200]  Labs: Recent Labs    06/15/20 0940 06/17/20 0255  HGB 12.8* 11.1*   Recent Labs    06/15/20 0940 06/17/20 0255  WBC 6.4 9.0  RBC 3.89* 3.48*  HCT 39.4 35.0*  PLT 225 201   Recent Labs    06/15/20 0940 06/17/20 0255  NA 139 139  K 5.1 5.3*  CL 107 107  CO2 24 21*  BUN 36* 29*  CREATININE 1.67* 1.24  GLUCOSE 155* 224*  CALCIUM 9.3 9.0   Recent Labs    06/15/20 0940  INR 1.0    Exam: General - Patient is Alert and Oriented Extremity - Neurologically intact Neurovascular intact Intact pulses distally Dorsiflexion/Plantar flexion intact Dressing - dressing C/D/I Motor Function - intact, moving foot and toes well on exam.   Past Medical History:  Diagnosis Date  . Anxiety    panic attacks  . Arthritis   . Cancer (HCC)    lip/basal  . Chronic kidney disease   . Depression   . Diabetes mellitus without complication ([place])    type 2  . GERD (gastroesophageal reflux disease)   . Gout   . History of kidney stones   .  Hyperaldosteronism ([place])    bilateral adrenal hyperplasia  . Hyperlipidemia   . Hypertension   . Nerve entrapment syndrome of lower extremity, right 10/24/2019  . Pneumonia    2014 and 2018 ( 2018-klebsiella pneumonia   . Sleep apnea     Assessment/Plan: 1 Day Post-Op Procedure(s) (LRB): TOTAL HIP ARTHROPLASTY ANTERIOR APPROACH (Right) Active Problems:   Osteoarthritis of right hip  Estimated body mass index is 32.75 kg/m as calculated from the following:   Height as of this encounter: 6\' 3"  (1.905 m).   Weight as of this encounter: 118.8 kg. Up with therapy  DVT Prophylaxis - Aspirin and TED hose Weight bearing as tolerated. Continue therapy.  Plan for one session of PT today, and if meeting goals, will plan for discharge this afternoon.   Patient to follow up with Dr. Wynelle Link in clinic in two weeks.   Plan is to go Home after hospital stay.  The PDMP database was reviewed today prior to any opioid medications being prescribed to this patient.  Fenton Foy, MBA, PA-C Orthopedic Surgery 06/17/2020, 7:36 AM

## 2020-06-18 ENCOUNTER — Encounter (HOSPITAL_COMMUNITY): Admission: RE | Admit: 2020-06-18 | Payer: Medicare PPO | Source: Ambulatory Visit

## 2020-06-21 ENCOUNTER — Ambulatory Visit: Payer: Medicare PPO | Admitting: Dermatology

## 2020-06-23 NOTE — Discharge Summary (Signed)
Physician Discharge Summary   Patient ID: Evan Rivas MRN: [id number] DOB/AGE: 70-Jul-1952 70 y.o.  Admit date: 06/16/2020 Discharge date: 06/17/2020  Primary Diagnosis: Osteoarthritis of right hip; s/p right THA   Admission Diagnoses:  Past Medical History:  Diagnosis Date   Anxiety    panic attacks   Arthritis    Cancer ([place])    lip/basal   Chronic kidney disease    Depression    Diabetes mellitus without complication ([place])    type 2   GERD (gastroesophageal reflux disease)    Gout    History of kidney stones    Hyperaldosteronism ([place])    bilateral adrenal hyperplasia   Hyperlipidemia    Hypertension    Nerve entrapment syndrome of lower extremity, right 10/24/2019   Pneumonia    2014 and 2018 ( 2018-klebsiella pneumonia    Sleep apnea    Discharge Diagnoses:   Active Problems:   Osteoarthritis of right hip  Estimated body mass index is 32.75 kg/m as calculated from the following:   Height as of this encounter: '6\' 3"'  (1.905 m).   Weight as of this encounter: 118.8 kg.  Procedure:  Procedure(s) (LRB): TOTAL HIP ARTHROPLASTY ANTERIOR APPROACH (Right)   Consults: None  HPI: Evan Rivas is a 70 y.o. male who has advanced end-  stage arthritis of their Right  hip with progressively worsening pain and  dysfunction.The patient has failed nonoperative management and presents for  total hip arthroplasty.   Laboratory Data: Admission on 06/16/2020, Discharged on 06/17/2020  Component Date Value Ref Range Status   Glucose-Capillary 06/16/2020 189* 70 - 99 mg/dL Final   Glucose reference range applies only to samples taken after fasting for at least 8 hours.   Glucose-Capillary 06/16/2020 130* 70 - 99 mg/dL Final   Glucose reference range applies only to samples taken after fasting for at least 8 hours.   Glucose-Capillary 06/16/2020 225* 70 - 99 mg/dL Final   Glucose reference range applies only to samples taken after fasting for at  least 8 hours.   WBC 06/17/2020 9.0  4.0 - 10.5 K/uL Final   RBC 06/17/2020 3.48* 4.22 - 5.81 MIL/uL Final   Hemoglobin 06/17/2020 11.1* 13.0 - 17.0 g/dL Final   HCT 06/17/2020 35.0* 39.0 - 52.0 % Final   MCV 06/17/2020 100.6* 80.0 - 100.0 fL Final   MCH 06/17/2020 31.9  26.0 - 34.0 pg Final   MCHC 06/17/2020 31.7  30.0 - 36.0 g/dL Final   RDW 06/17/2020 12.9  11.5 - 15.5 % Final   Platelets 06/17/2020 201  150 - 400 K/uL Final   nRBC 06/17/2020 0.0  0.0 - 0.2 % Final   Performed at [facility], [place] [address] [place], [place] [address]   Sodium 06/17/2020 139  135 - 145 mmol/L Final   Potassium 06/17/2020 5.3* 3.5 - 5.1 mmol/L Final   Chloride 06/17/2020 107  98 - 111 mmol/L Final   CO2 06/17/2020 21* 22 - 32 mmol/L Final   Glucose, Bld 06/17/2020 224* 70 - 99 mg/dL Final   Glucose reference range applies only to samples taken after fasting for at least 8 hours.   BUN 06/17/2020 29* 8 - 23 mg/dL Final   Creatinine, Ser 06/17/2020 1.24  0.61 - 1.24 mg/dL Final   Calcium 06/17/2020 9.0  8.9 - 10.3 mg/dL Final   GFR, Estimated 06/17/2020 >60  >60 mL/min Final   Comment: (NOTE) Calculated using the CKD-EPI Creatinine Equation (2021)  Anion gap 06/17/2020 11  5 - 15 Final   Performed at [facility], [place] [address] [place], [place] [address]   Glucose-Capillary 06/16/2020 247* 70 - 99 mg/dL Final   Glucose reference range applies only to samples taken after fasting for at least 8 hours.   Glucose-Capillary 06/17/2020 185* 70 - 99 mg/dL Final   Glucose reference range applies only to samples taken after fasting for at least 8 hours.  Hospital Outpatient Visit on 06/15/2020  Component Date Value Ref Range Status   MRSA, PCR 06/15/2020 NEGATIVE  NEGATIVE Final   Staphylococcus aureus 06/15/2020 POSITIVE* NEGATIVE Final   Comment: (NOTE) The Xpert SA Assay (FDA approved for NASAL specimens in patients 81 years of age and  older), is one component of a comprehensive surveillance program. It is not intended to diagnose infection nor to guide or monitor treatment. Performed at [facility], [place] [address], [place], [place] [address]    Hgb A1c MFr Bld 06/15/2020 7.4* 4.8 - 5.6 % Final   Comment: (NOTE) Pre diabetes:          5.7%-6.4%  Diabetes:              >6.4%  Glycemic control for   <7.0% adults with diabetes    Mean Plasma Glucose 06/15/2020 165.68  mg/dL Final   Performed at [facility], [place] [address], [place], [place] [address]   WBC 06/15/2020 6.4  4.0 - 10.5 K/uL Final   RBC 06/15/2020 3.89* 4.22 - 5.81 MIL/uL Final   Hemoglobin 06/15/2020 12.8* 13.0 - 17.0 g/dL Final   HCT 06/15/2020 39.4  39.0 - 52.0 % Final   MCV 06/15/2020 101.3* 80.0 - 100.0 fL Final   MCH 06/15/2020 32.9  26.0 - 34.0 pg Final   MCHC 06/15/2020 32.5  30.0 - 36.0 g/dL Final   RDW 06/15/2020 12.9  11.5 - 15.5 % Final   Platelets 06/15/2020 225  150 - 400 K/uL Final   nRBC 06/15/2020 0.0  0.0 - 0.2 % Final   Performed at [facility], [place] [address] [place], [place] [address]   Sodium 06/15/2020 139  135 - 145 mmol/L Final   Potassium 06/15/2020 5.1  3.5 - 5.1 mmol/L Final   Chloride 06/15/2020 107  98 - 111 mmol/L Final   CO2 06/15/2020 24  22 - 32 mmol/L Final   Glucose, Bld 06/15/2020 155* 70 - 99 mg/dL Final   Glucose reference range applies only to samples taken after fasting for at least 8 hours.   BUN 06/15/2020 36* 8 - 23 mg/dL Final   Creatinine, Ser 06/15/2020 1.67* 0.61 - 1.24 mg/dL Final   Calcium 06/15/2020 9.3  8.9 - 10.3 mg/dL Final   Total Protein 06/15/2020 6.9  6.5 - 8.1 g/dL Final   Albumin 06/15/2020 3.8  3.5 - 5.0 g/dL Final   AST 06/15/2020 19  15 - 41 U/L Final   ALT 06/15/2020 19  0 - 44 U/L Final   Alkaline Phosphatase 06/15/2020 51  38 - 126 U/L Final   Total Bilirubin 06/15/2020 0.7  0.3 - 1.2 mg/dL Final   GFR,  Estimated 06/15/2020 44* >60 mL/min Final   Comment: (NOTE) Calculated using the CKD-EPI Creatinine Equation (2021)    Anion gap 06/15/2020 8  5 - 15 Final   Performed at [facility], [place] [address] [place], [place] [address]   Prothrombin Time 06/15/2020 12.7  11.4 - 15.2 seconds  Final   INR 06/15/2020 1.0  0.8 - 1.2 Final   Comment: (NOTE) INR goal varies based on device and disease states. Performed at [facility], [place] [address], [place], [place] [address]    aPTT 06/15/2020 27  24 - 36 seconds Final   Performed at [facility], [place] [address] [place], [place] [address]   ABO/RH(D) 06/15/2020 A POS   Final   Antibody Screen 06/15/2020 NEG   Final   Sample Expiration 06/15/2020 06/19/2020,2359   Final   Extend sample reason 06/15/2020    Final                   Value:NO TRANSFUSIONS OR PREGNANCY IN THE PAST 3 MONTHS Performed at [place] [address] [place], [place] [address]    Glucose-Capillary 06/15/2020 143* 70 - 99 mg/dL Final   Glucose reference range applies only to samples taken after fasting for at least 8 hours.  Office Visit on 06/14/2020  Component Date Value Ref Range Status   Glucose, Bld 06/14/2020 150* 65 - 99 mg/dL Final   Comment: .            Fasting reference interval . For someone without known diabetes, a glucose value >125 mg/dL indicates that they may have diabetes and this should be confirmed with a follow-up test. .    BUN 06/14/2020 32* 7 - 25 mg/dL Final   Creat 06/14/2020 1.35* 0.70 - 1.25 mg/dL Final   Comment: For patients >44 years of age, the reference limit for Creatinine is approximately 13% higher for people identified as African-American. .    BUN/Creatinine Ratio 06/14/2020 24* 6 - 22 (calc) Final   Sodium 06/14/2020 142  135 - 146 mmol/L Final   Potassium 06/14/2020 5.6* 3.5 - 5.3 mmol/L Final   Chloride 06/14/2020 106  98 -  110 mmol/L Final   CO2 06/14/2020 26  20 - 32 mmol/L Final   Calcium 06/14/2020 9.6  8.6 - 10.3 mg/dL Final   Hemoglobin A1C 06/14/2020 6.6* 4.0 - 5.6 % Final  Hospital Outpatient Visit on 06/12/2020  Component Date Value Ref Range Status   SARS Coronavirus 2 06/12/2020 NEGATIVE  NEGATIVE Final   Comment: (NOTE) SARS-CoV-2 target nucleic acids are NOT DETECTED.  The SARS-CoV-2 RNA is generally detectable in upper and lower respiratory specimens during the acute phase of infection. Negative results do not preclude SARS-CoV-2 infection, do not rule out co-infections with other pathogens, and should not be used as the sole basis for treatment or other patient management decisions. Negative results must be combined with clinical observations, patient history, and epidemiological information. The expected result is Negative.  Fact Sheet for Patients: SugarRoll.be  Fact Sheet for Healthcare Providers: https://www.woods-mathews.com/  This test is not yet approved or cleared by the [place] FDA and  has been authorized for detection and/or diagnosis of SARS-CoV-2 by FDA under an Emergency Use Authorization (EUA). This EUA will remain  in effect (meaning this test can be used) for the duration of the COVID-19 declaration under Se                          ction 564(b)(1) of the Act, 21 U.S.C. section 360bbb-3(b)(1), unless the authorization is terminated or revoked sooner.  Performed at [facility], [place] [address], [place],  [address]   Office Visit on 05/03/2020  Component Date Value Ref Range Status  Hemoglobin 05/03/2020 14.8  13.2 - 17.1 g/dL Final   HCT 05/03/2020 44.1  38.5 - 50.0 % Final     X-Rays:DG Pelvis Portable  Result Date: 06/16/2020 CLINICAL DATA:  RIGHT hip replacement EXAM: PORTABLE PELVIS 1-2 VIEWS COMPARISON:  None. FINDINGS: RIGHT total arthroplasty of the RIGHT hip. No complicating features.  Brachytherapy seeds in the prostate gland noted. IMPRESSION: No complication following total hip arthroplasty Electronically Signed   By: Suzy Bouchard M.D.   On: 06/16/2020 12:46   DG C-Arm 1-60 Min-No Report  Result Date: 06/16/2020 Fluoroscopy was utilized by the requesting physician.  No radiographic interpretation.   DG HIP OPERATIVE UNILAT W OR W/O PELVIS RIGHT  Result Date: 06/16/2020 CLINICAL DATA:  Right hip arthroplasty EXAM: OPERATIVE RIGHT HIP (WITH PELVIS IF PERFORMED) AP VIEWS TECHNIQUE: Fluoroscopic spot image(s) were submitted for interpretation post-operatively. COMPARISON:  09/04/2019 FINDINGS: 2 C-arm fluoroscopic images were obtained intraoperatively and submitted for post operative interpretation. Radiopaque marker lap sponge projects over the greater trochanter on initial image. Hardware appears appropriately position without evidence of periprosthetic fracture. Please see the performing provider's procedural report for further detail. IMPRESSION: 1. Intraoperative fluoroscopy from right total hip arthroplasty. 2. Radiopaque marker of a lap sponge projects over the greater trochanter on initial image. Electronically Signed   By: Davina Poke D.O.   On: 06/16/2020 11:56   DG HIP OPERATIVE UNILAT WITH PELVIS RIGHT  Result Date: 06/16/2020 CLINICAL DATA:  Right hip arthroplasty EXAM: OPERATIVE RIGHT HIP (WITH PELVIS IF PERFORMED) AP VIEWS TECHNIQUE: Fluoroscopic spot image(s) were submitted for interpretation post-operatively. COMPARISON:  09/04/2019 FINDINGS: 2 C-arm fluoroscopic images were obtained intraoperatively and submitted for post operative interpretation. Radiopaque marker lap sponge projects over the greater trochanter on initial image. Hardware appears appropriately position without evidence of periprosthetic fracture. Please see the performing provider's procedural report for further detail. IMPRESSION: 1. Intraoperative fluoroscopy from right total hip  arthroplasty. 2. Radiopaque marker of a lap sponge projects over the greater trochanter on initial image. Electronically Signed   By: Davina Poke D.O.   On: 06/16/2020 11:56    EKG: Orders placed or performed in visit on 09/22/19   EKG 12-Lead     Hospital Course: Evan Rivas is a 70 y.o. who was admitted to [facility]. They were brought to the operating room on 06/16/2020 and underwent Procedure(s): [place].  Patient tolerated the procedure well and was later transferred to the recovery room and then to the orthopaedic floor for postoperative care. They were given PO and IV analgesics for pain control following their surgery. They were given 24 hours of postoperative antibiotics of  Anti-infectives (From admission, onward)   Start     Dose/Rate Route Frequency Ordered Stop   06/16/20 1600  ceFAZolin (ANCEF) IVPB 2g/100 mL premix        2 g 200 mL/hr over 30 Minutes Intravenous Every 6 hours 06/16/20 1349 06/16/20 2141   06/16/20 0800  ceFAZolin (ANCEF) IVPB 2g/100 mL premix        2 g 200 mL/hr over 30 Minutes Intravenous On call to O.R. 06/16/20 0160 06/16/20 1006     and started on DVT prophylaxis in the form of Aspirin and TED hose.   PT and OT were ordered for total joint protocol. Discharge planning consulted to help with postop disposition and equipment needs. Patient had an uneventful night on the evening of surgery. They started to get up OOB with therapy on 06/16/20. Pt  worked with therapy for two additional sessions on 06/17/20, and was meeting their goals. He was discharged to home later that day in stable condition.  Diet: Regular diet Activity: WBAT Follow-up: in two weeks Disposition: Home Discharged Condition: good   Discharge Instructions    Call MD / Call 911   Complete by: As directed    If you experience chest pain or shortness of breath, CALL 911 and be transported to the hospital emergency room.  If you develope  a fever above 101 F, pus (white drainage) or increased drainage or redness at the wound, or calf pain, call your surgeon's office.   Call MD / Call 911   Complete by: As directed    If you experience chest pain or shortness of breath, CALL 911 and be transported to the hospital emergency room.  If you develope a fever above 101 F, pus (white drainage) or increased drainage or redness at the wound, or calf pain, call your surgeon's office.   Change dressing   Complete by: As directed    You have an adhesive waterproof bandage over the incision. Leave this in place until your first follow-up appointment. Once you remove this you will not need to place another bandage.   Change dressing   Complete by: As directed    You have an adhesive waterproof bandage over the incision. Leave this in place until your first follow-up appointment. Once you remove this you will not need to place another bandage.   Constipation Prevention   Complete by: As directed    Drink plenty of fluids.  Prune juice may be helpful.  You may use a stool softener, such as Colace (over the counter) 100 mg twice a day.  Use MiraLax (over the counter) for constipation as needed.   Constipation Prevention   Complete by: As directed    Drink plenty of fluids.  Prune juice may be helpful.  You may use a stool softener, such as Colace (over the counter) 100 mg twice a day.  Use MiraLax (over the counter) for constipation as needed.   Diet - low sodium heart healthy   Complete by: As directed    Diet - low sodium heart healthy   Complete by: As directed    Do not sit on low chairs, stoools or toilet seats, as it may be difficult to get up from low surfaces   Complete by: As directed    Do not sit on low chairs, stoools or toilet seats, as it may be difficult to get up from low surfaces   Complete by: As directed    Driving restrictions   Complete by: As directed    No driving for two weeks   Driving restrictions   Complete by: As  directed    No driving for two weeks   TED hose   Complete by: As directed    Use stockings (TED hose) for three weeks on both leg(s).  You may remove them at night for sleeping.   TED hose   Complete by: As directed    Use stockings (TED hose) for three weeks on both leg(s).  You may remove them at night for sleeping.   Weight bearing as tolerated   Complete by: As directed    Weight bearing as tolerated   Complete by: As directed      Allergies as of 06/17/2020      Reactions   Celebrex [celecoxib]    GI issues  Tetanus Toxoids Other (See Comments)   unkown   Tetanus-diphtheria Toxoids Td    Unknown      Medication List    STOP taking these medications   acetaminophen 650 MG CR tablet Commonly known as: TYLENOL   tiZANidine 4 MG tablet Commonly known as: Zanaflex     TAKE these medications   Accu-Chek Aviva Plus test strip Generic drug: glucose blood Use as instructed   True Metrix Blood Glucose Test test strip Generic drug: glucose blood Use as instructed   aspirin 325 MG EC tablet Take 1 tablet (325 mg total) by mouth 2 (two) times daily. Then take one 81 mg aspirin once a day for three weeks. Then discontinue aspirin. What changed:   medication strength  how much to take  when to take this  additional instructions   atorvastatin 40 MG tablet Commonly known as: LIPITOR Take 1 tablet (40 mg total) by mouth at bedtime.   blood glucose meter kit and supplies Dispense based on patient and insurance preference. Use up to four times daily as directed. (FOR ICD-10 E10.9, E11.9).   DULoxetine 60 MG capsule Commonly known as: CYMBALTA TAKE 1 CAPSULE EVERY DAY   fluticasone 50 MCG/ACT nasal spray Commonly known as: FLONASE Place 2 sprays into both nostrils daily. 2 sprays What changed:   when to take this  reasons to take this  additional instructions   gabapentin 300 MG capsule Commonly known as: NEURONTIN Take 300 mg by mouth 3 (three) times  daily.   glipiZIDE 10 MG tablet Commonly known as: GLUCOTROL TAKE 1 TABLET (10 MG TOTAL) BY MOUTH DAILY BEFORE BREAKFAST.   HYDROcodone-acetaminophen 10-325 MG tablet Commonly known as: NORCO Take 1 tablet by mouth 3 (three) times daily. Start taking on: June 28, 2020   hydrOXYzine 25 MG tablet Commonly known as: ATARAX/VISTARIL Take 25 mg by mouth 3 (three) times daily as needed for anxiety.   metFORMIN 1000 MG tablet Commonly known as: GLUCOPHAGE Take 1 tablet (1,000 mg total) by mouth 2 (two) times daily with a meal.   methocarbamol 500 MG tablet Commonly known as: ROBAXIN Take 1 tablet (500 mg total) by mouth every 6 (six) hours as needed for muscle spasms.   metoprolol succinate 100 MG 24 hr tablet Commonly known as: TOPROL-XL Take 100 mg by mouth daily. Take with or immediately following a meal.   multivitamin tablet Take 1 tablet by mouth daily.   oxyCODONE 5 MG immediate release tablet Commonly known as: Oxy IR/ROXICODONE Take 1-2 tablets (5-10 mg total) by mouth every 6 (six) hours as needed for severe pain.   spironolactone 25 MG tablet Commonly known as: ALDACTONE Take 25 mg by mouth daily.   TRUEplus Lancets 97Q Misc   Trulicity 1.5 BH/4.1PF Sopn Generic drug: Dulaglutide Inject 1.5 mg into the skin once a week.   valsartan-hydrochlorothiazide 320-12.5 MG tablet Commonly known as: DIOVAN-HCT TAKE 1 TABLET EVERY DAY            Discharge Care Instructions  (From admission, onward)         Start     Ordered   06/17/20 0000  Weight bearing as tolerated        06/17/20 0744   06/17/20 0000  Change dressing       Comments: You have an adhesive waterproof bandage over the incision. Leave this in place until your first follow-up appointment. Once you remove this you will not need to place another bandage.   06/17/20 7902  06/17/20 0000  Weight bearing as tolerated        06/17/20 1100   06/17/20 0000  Change dressing       Comments: You have an  adhesive waterproof bandage over the incision. Leave this in place until your first follow-up appointment. Once you remove this you will not need to place another bandage.   06/17/20 1100          Follow-up Information    Gaynelle Arabian, MD. Schedule an appointment as soon as possible for a visit on 06/29/2020.   Specialty: Orthopedic Surgery Contact information: [address] [place] [address] [phone]               Signed: Fenton Foy, MBA, PA-C Orthopedic Surgery 06/23/2020, 7:24 AM

## 2020-07-02 ENCOUNTER — Other Ambulatory Visit: Payer: Self-pay | Admitting: Internal Medicine

## 2020-07-02 ENCOUNTER — Telehealth: Payer: Self-pay | Admitting: Internal Medicine

## 2020-07-02 DIAGNOSIS — F331 Major depressive disorder, recurrent, moderate: Secondary | ICD-10-CM

## 2020-07-02 DIAGNOSIS — F419 Anxiety disorder, unspecified: Secondary | ICD-10-CM

## 2020-07-02 MED ORDER — DULOXETINE HCL 60 MG PO CPEP: 60.0000 mg | ORAL_CAPSULE | Freq: Every day | ORAL | 0 refills | Status: DC

## 2020-07-02 NOTE — Telephone Encounter (Signed)
Pt called appt setup per Dr. Ky Barban

## 2020-07-02 NOTE — Telephone Encounter (Signed)
Medication Refill - Medication: DULoxetine (CYMBALTA) 60 MG capsule   Has the patient contacted their pharmacy? Yes.   (Agent: If no, request that the patient contact the pharmacy for the refill.) (Agent: If yes, when and what did the pharmacy advise?)  Preferred Pharmacy (with phone number or street name): [facility]   Agent: Please be advised that RX refills may take up to 3 business days. We ask that you follow-up with your pharmacy.

## 2020-07-02 NOTE — Telephone Encounter (Signed)
Copied from [place] [phone]. Topic: General - Other >> Jul 02, 2020  9:42 AM Alanda Slim E wrote: Reason for CRM: Pt had a discussion with Dr. Ky Barban about hydrocodone after his hip surgery / he needed to speak with a provider about this asap/ pt has had his surgery/ please advise

## 2020-07-05 ENCOUNTER — Other Ambulatory Visit: Payer: Self-pay

## 2020-07-05 DIAGNOSIS — E119 Type 2 diabetes mellitus without complications: Secondary | ICD-10-CM

## 2020-07-05 MED ORDER — METFORMIN HCL 1000 MG PO TABS: 1000.0000 mg | ORAL_TABLET | Freq: Two times a day (BID) | ORAL | 1 refills | Status: DC

## 2020-07-08 ENCOUNTER — Other Ambulatory Visit: Payer: Self-pay | Admitting: Family Medicine

## 2020-07-08 DIAGNOSIS — I10 Essential (primary) hypertension: Secondary | ICD-10-CM

## 2020-07-12 ENCOUNTER — Telehealth: Payer: Self-pay

## 2020-07-12 NOTE — Progress Notes (Signed)
Chronic Care Management Pharmacy Assistant   Name: Evan Rivas  MRN: [id number] DOB: 09-May-1950  Reason for Encounter:Diabetes Disease State Call.  Recent office visits:  06/14/2020 PCP office [facility]   Recent consult visits:  No consult Visit  Hospital visits:  Medication Reconciliation was completed by comparing discharge summary, patient's EMR and Pharmacy list, and upon discussion with patient.  Admitted to the hospital on 06/16/2020 due to Status post hip replacement surgery. Discharge date was 06/17/2020. Discharged from [facility]?Medications Started at [facility] Discharge:?? -started Oxycodone 5-10 mg due to Chronic Pain  Medication Changes at Hospital Discharge: -Changed None   Medications Discontinued at Hospital Discharge: -Stopped none   Medications that remain the same after Hospital Discharge:??  -All other medications will remain the same.    Medications: Outpatient Encounter Medications as of 07/12/2020  Medication Sig Note  . aspirin EC 325 MG EC tablet Take 1 tablet (325 mg total) by mouth 2 (two) times daily. Then take one 81 mg aspirin once a day for three weeks. Then discontinue aspirin.   [name] atorvastatin (LIPITOR) 40 MG tablet Take 1 tablet (40 mg total) by mouth at bedtime.   . blood glucose meter kit and supplies Dispense based on patient and insurance preference. Use up to four times daily as directed. (FOR ICD-10 E10.9, E11.9).   . Dulaglutide (TRULICITY) 1.5 VQ/0.0QQ SOPN Inject 1.5 mg into the skin once a week. 06/07/2020: Wednesdays   . DULoxetine (CYMBALTA) 60 MG capsule Take 1 capsule (60 mg total) by mouth daily.   . fluticasone (FLONASE) 50 MCG/ACT nasal spray Place 2 sprays into both nostrils daily. 2 sprays (Patient taking differently: Place 2 sprays into both nostrils daily as needed for allergies.)   . gabapentin (NEURONTIN) 300 MG capsule Take 300 mg by mouth 3 (three) times daily.   [name] glipiZIDE  (GLUCOTROL) 10 MG tablet TAKE 1 TABLET (10 MG TOTAL) BY MOUTH DAILY BEFORE BREAKFAST.   [name] glucose blood (ACCU-CHEK AVIVA PLUS) test strip Use as instructed   . glucose blood (TRUE METRIX BLOOD GLUCOSE TEST) test strip Use as instructed   . HYDROcodone-acetaminophen (NORCO) 10-325 MG tablet Take 1 tablet by mouth 3 (three) times daily.   . hydrOXYzine (ATARAX/VISTARIL) 25 MG tablet Take 25 mg by mouth 3 (three) times daily as needed for anxiety.    . metFORMIN (GLUCOPHAGE) 1000 MG tablet Take 1 tablet (1,000 mg total) by mouth 2 (two) times daily with a meal.   . methocarbamol (ROBAXIN) 500 MG tablet Take 1 tablet (500 mg total) by mouth every 6 (six) hours as needed for muscle spasms.   . metoprolol succinate (TOPROL-XL) 100 MG 24 hr tablet Take 100 mg by mouth daily. Take with or immediately following a meal.   . Multiple Vitamin (MULTIVITAMIN) tablet Take 1 tablet by mouth daily.   [name] oxyCODONE (OXY IR/ROXICODONE) 5 MG immediate release tablet Take 1-2 tablets (5-10 mg total) by mouth every 6 (six) hours as needed for severe pain.   [name] spironolactone (ALDACTONE) 25 MG tablet Take 25 mg by mouth daily. 05/13/2020: Prescribed by Dr. Candiss Norse  . TRUEplus Lancets 33G MISC    . valsartan-hydrochlorothiazide (DIOVAN-HCT) 320-12.5 MG tablet TAKE 1 TABLET EVERY DAY    No facility-administered encounter medications on file as of 07/12/2020.    Star Rating Drugs: Atorvastatin 40 mg last filled on 04/22/2020 for 90 day supply at [facility]. Trulicity 7.6PP last filled on 10/03/2018 for 84  day supply at [facility]. Glipizide 10 mg last filled on 04/21/2020 for 90 day supply at [facility]. Metformin 1000 mg last filled on 04/23/2020 for 90 day supply at [facility]. Valsartan-HCTZ 320-12.5 mg last filled on 04/26/2020 for 90 day supply at [facility]  Recent Relevant Labs: Lab Results  Component Value Date/Time   HGBA1C 7.4 (H) 06/15/2020 09:40 AM   HGBA1C 6.6 (A) 06/14/2020 11:26  AM   HGBA1C 7.6 (H) 03/16/2020 12:00 AM   HGBA1C 8.9 03/25/2018 10:00 AM   HGBA1C 8.9 (A) 03/25/2018 10:00 AM   HGBA1C 8.9 (A) 03/25/2018 10:00 AM   HGBA1C 8.0 (A) 12/18/2017 08:57 AM   MICROALBUR 0.3 03/16/2020 12:00 AM   MICROALBUR 10.9 04/07/2019 10:58 AM    Kidney Function Lab Results  Component Value Date/Time   CREATININE 1.24 06/17/2020 02:55 AM   CREATININE 1.67 (H) 06/15/2020 09:40 AM   CREATININE 1.35 (H) 06/14/2020 11:29 AM   CREATININE 1.58 (H) 03/16/2020 12:00 AM   GFRNONAA >60 06/17/2020 02:55 AM   GFRNONAA 44 (L) 03/16/2020 12:00 AM   GFRAA 51 (L) 03/16/2020 12:00 AM    . Current antihyperglycemic regimen:   Trulicity 1.5 mg weekly   Glipizide 10 mg daily   Metformin 1000 mg twice daily  . What recent interventions/DTPs have been made to improve glycemic control:  o None ID . Have there been any recent hospitalizations or ED visits since last visit with CPP? Yes . Patient denies hypoglycemic symptoms, including Pale, Sweaty, Shaky, Hungry, Nervous/irritable and Vision changes . Patient denies hyperglycemic symptoms, including blurry vision, excessive thirst, fatigue, polyuria and weakness . How often are you checking your blood sugar? once daily . What are your blood sugars ranging?  o Fasting: N/A o Before meals:  - On 07/07/2020 it was 156. - On 07/08/2020 it was 149. - On 07/09/2020 it was 155. - On 07/10/2020 it was 152. - On 07/11/2020 it was 153. - On 07/12/2020 it was 137. o After meals: N/A o Bedtime: N/A . During the week, how often does your blood glucose drop below 70? Never . Are you checking your feet daily/regularly?   Patient denies numbness,pain,burning or tingling in his feet.  Adherence Review: Is the patient currently on a STATIN medication? Yes Is the patient currently on ACE/ARB medication? Yes Does the patient have >5 day gap between last estimated fill dates? No   [place] Clinical Set designer [id number]

## 2020-07-22 ENCOUNTER — Other Ambulatory Visit: Payer: Self-pay

## 2020-07-22 ENCOUNTER — Encounter: Payer: Self-pay | Admitting: Family Medicine

## 2020-07-22 ENCOUNTER — Ambulatory Visit: Payer: Medicare PPO | Admitting: Family Medicine

## 2020-07-22 DIAGNOSIS — F1120 Opioid dependence, uncomplicated: Secondary | ICD-10-CM

## 2020-07-22 DIAGNOSIS — G8929 Other chronic pain: Secondary | ICD-10-CM | POA: Diagnosis not present

## 2020-07-22 DIAGNOSIS — Z79899 Other long term (current) drug therapy: Secondary | ICD-10-CM | POA: Diagnosis not present

## 2020-07-22 MED ORDER — HYDROCODONE-ACETAMINOPHEN 10-325 MG PO TABS: ORAL_TABLET | ORAL | 0 refills | Status: DC

## 2020-07-22 NOTE — Progress Notes (Signed)
4/21/202211:17 AM  Evan Rivas 08-22-1950, 70 y.o., male [id number]  Chief Complaint  Patient presents with  . Pain Management    HPI:   Patient is a 70 y.o. male with past medical history significant for chronic pain, HTN, DM who presents today for medication weaning.  Last seen by Dr. Ky Barban on 06/14/20 At that time refused chronic pain referral Chronic narcotics - for chronic low back pain and knee pain.  S/p b/l knee replacements.  Undergoing R THR 06/16/20.  Due for refill 06/27/20.   Oxy 95m last filled 3/29 (Started in the hospital post surgery) Hydrocodone/Acetaminophen 10-325 last filled 3/28 PDMP reviewed  Everything is going well post surgery Had surgery 3/16 Was released from ortho  Takes Hydrocodone/acetaminophen 10/325 tid Pain management is more expensive for him to see daily Continues to have knee, hip, and back pain Has been at this dose over 20 years   [facility] 07/22/2020 06/14/2020 05/03/2020  Decreased Interest 0 0 0  Down, Depressed, Hopeless 0 0 0  PHQ - 2 Score 0 0 0  Altered sleeping 0 - -  Tired, decreased energy 0 - -  Change in appetite 0 - -  Feeling bad or failure about yourself  0 - -  Trouble concentrating 0 - -  Moving slowly or fidgety/restless 0 - -  Suicidal thoughts 0 - -  PHQ-9 Score 0 - -  Difficult doing work/chores - - -  Some recent data might be hidden    Fall Risk  07/22/2020 06/14/2020 05/03/2020 03/16/2020 01/15/2020  Falls in the past year? 0 0 0 0 0  Number falls in past yr: 0 0 0 0 0  Injury with Fall? 0 0 0 0 0  Risk for fall due to : - - - - No Fall Risks  Follow up - - - - Falls prevention discussed     Allergies  Allergen Reactions  . Celebrex [Celecoxib]     GI issues  . Tetanus Toxoids Other (See Comments)    unkown  . Tetanus-Diphtheria Toxoids Td     Unknown    Prior to Admission medications   Medication Sig Start Date End Date Taking? Authorizing Provider  aspirin EC 325  MG EC tablet Take 1 tablet (325 mg total) by mouth 2 (two) times daily. Then take one 81 mg aspirin once a day for three weeks. Then discontinue aspirin. 06/17/20   CFenton FoyD, PA-C  atorvastatin (LIPITOR) 40 MG tablet Take 1 tablet (40 mg total) by mouth at bedtime. 11/26/19   HTowanda Malkin MD  blood glucose meter kit and supplies Dispense based on patient and insurance preference. Use up to four times daily as directed. (FOR ICD-10 E10.9, E11.9). 12/31/19   HTowanda Malkin MD  Dulaglutide (TRULICITY) 1.5 MBX/0.3YBSOPN Inject 1.5 mg into the skin once a week. 06/27/19   SSteele Sizer MD  DULoxetine (CYMBALTA) 60 MG capsule Take 1 capsule (60 mg total) by mouth daily. 07/02/20   PTrinna Post PA-C  fluticasone (FLONASE) 50 MCG/ACT nasal spray Place 2 sprays into both nostrils daily. 2 sprays Patient taking differently: Place 2 sprays into both nostrils daily as needed for allergies. 03/25/18   BHubbard Hartshorn FNP  gabapentin (NEURONTIN) 300 MG capsule Take 300 mg by mouth 3 (three) times daily.    [provider]  glipiZIDE (GLUCOTROL) 10 MG tablet TAKE 1 TABLET (10 MG TOTAL) BY MOUTH DAILY BEFORE BREAKFAST. 03/04/20  Lebron Conners D, MD  glucose blood (ACCU-CHEK AVIVA PLUS) test strip Use as instructed 12/15/19   Lebron Conners D, MD  glucose blood (TRUE METRIX BLOOD GLUCOSE TEST) test strip Use as instructed 06/15/20   Myles Gip, DO  HYDROcodone-acetaminophen (NORCO) 10-325 MG tablet Take 1 tablet by mouth 3 (three) times daily. 06/28/20 07/28/20  Myles Gip, DO  hydrOXYzine (ATARAX/VISTARIL) 25 MG tablet Take 25 mg by mouth 3 (three) times daily as needed for anxiety.     [provider]  metFORMIN (GLUCOPHAGE) 1000 MG tablet Take 1 tablet (1,000 mg total) by mouth 2 (two) times daily with a meal. 07/05/20   Delsa Grana, PA-C  methocarbamol (ROBAXIN) 500 MG tablet Take 1 tablet (500 mg total) by mouth every 6 (six) hours as  needed for muscle spasms. 06/17/20   Fenton Foy D, PA-C  metoprolol succinate (TOPROL-XL) 100 MG 24 hr tablet Take 100 mg by mouth daily. Take with or immediately following a meal.    [provider]  Multiple Vitamin (MULTIVITAMIN) tablet Take 1 tablet by mouth daily.    [provider]  oxyCODONE (OXY IR/ROXICODONE) 5 MG immediate release tablet Take 1-2 tablets (5-10 mg total) by mouth every 6 (six) hours as needed for severe pain. 06/17/20   Jonnie Kind, PA-C  spironolactone (ALDACTONE) 25 MG tablet Take 25 mg by mouth daily.    [provider]  TRUEplus Lancets 33G [place]  01/05/20   [provider]  valsartan-hydrochlorothiazide (DIOVAN-HCT) 320-12.5 MG tablet TAKE 1 TABLET EVERY DAY 07/08/20   Myles Gip, DO    Past Medical History:  Diagnosis Date  . Anxiety    panic attacks  . Arthritis   . Cancer (HCC)    lip/basal  . Chronic kidney disease   . Depression   . Diabetes mellitus without complication ([place])    type 2  . GERD (gastroesophageal reflux disease)   . Gout   . History of kidney stones   . Hyperaldosteronism ([place])    bilateral adrenal hyperplasia  . Hyperlipidemia   . Hypertension   . Nerve entrapment syndrome of lower extremity, right 10/24/2019  . Pneumonia    2014 and 2018 ( 2018-klebsiella pneumonia   . Sleep apnea     Past Surgical History:  Procedure Laterality Date  . ARTHROSCOPIC REPAIR ACL Left   . CYSTOSCOPY W/ URETERAL STENT PLACEMENT Right 09/07/2014   Procedure: CYSTOSCOPY WITH STENT REPLACEMENT;  Surgeon: Hollice Espy, MD;  Location: [facility];  Service: Urology;  Laterality: Right;  . CYSTOSCOPY WITH INSERTION OF UROLIFT N/A 11/24/2019   Procedure: CYSTOSCOPY WITH INSERTION OF UROLIFT;  Surgeon: Hollice Espy, MD;  Location: [facility];  Service: Urology;  Laterality: N/A;  . CYSTOSCOPY/URETEROSCOPY/HOLMIUM LASER/STENT PLACEMENT Right 08/17/2014   Procedure: CYSTOSCOPY/URETEROSCOPY//STENT PLACEMENT/  attempt of lithotripsy;  Surgeon: Hollice Espy, MD;  Location: [facility];  Service: Urology;  Laterality: Right;  . HERNIA REPAIR    . JOINT REPLACEMENT    . MOHS SURGERY     lip  . TOTAL HIP ARTHROPLASTY Right 06/16/2020   Procedure: TOTAL HIP ARTHROPLASTY ANTERIOR APPROACH;  Surgeon: Gaynelle Arabian, MD;  Location: [facility];  Service: Orthopedics;  Laterality: Right;  148mn  . TOTAL KNEE ARTHROPLASTY Left 04/24/2016   Procedure: LEFT TOTAL KNEE ARTHROPLASTY;  Surgeon: FGaynelle Arabian MD;  Location: [facility];  Service: Orthopedics;  Laterality: Left;  Adductor Block  . TOTAL KNEE ARTHROPLASTY Right 10/23/2016   Procedure: RIGHT TOTAL KNEE ARTHROPLASTY;  Surgeon: Gaynelle Arabian, MD;  Location: [facility];  Service: Orthopedics;  Laterality: Right;  . URETEROSCOPY WITH HOLMIUM LASER LITHOTRIPSY Right 09/07/2014   Procedure: URETEROSCOPY WITH HOLMIUM LASER LITHOTRIPSY;  Surgeon: Hollice Espy, MD;  Location: [facility];  Service: Urology;  Laterality: Right;  [name] VASECTOMY      Social History   Tobacco Use  . Smoking status: Never Smoker  . Smokeless tobacco: Never Used  Substance Use Topics  . Alcohol use: No    Family History  Problem Relation Age of Onset  . Hypertension Mother   . Cancer Mother   . Hyperlipidemia Mother   . Arthritis Mother   . Stroke Father   . Heart disease Father   . Obesity Father   . Depression Sister   . Hypertension Sister   . Hyperlipidemia Sister   . Learning disabilities Sister   . Obesity Sister   . Kidney disease Maternal Grandmother     Review of Systems  Respiratory: Negative.   Cardiovascular: Negative.   Musculoskeletal: Positive for back pain and joint pain.  Neurological: Negative.      OBJECTIVE:  Today's Vitals   07/22/20 1059  BP: 138/70  Pulse: 94  Resp: 16  Temp: 97.7 F (36.5 C)  SpO2: 99%  Weight: 264 lb 4.8 oz (119.9 kg)  Height: '6\' 3"'  (1.905 m)   Body mass index is 33.04 kg/m.   Physical Exam Vitals reviewed.   Constitutional:      Appearance: Normal appearance.  HENT:     Head: Normocephalic and atraumatic.  Eyes:     Conjunctiva/sclera: Conjunctivae normal.     Pupils: Pupils are equal, round, and reactive to light.  Cardiovascular:     Rate and Rhythm: Normal rate and regular rhythm.     Pulses: Normal pulses.     Heart sounds: Normal heart sounds. No murmur heard. No friction rub. No gallop.   Pulmonary:     Effort: Pulmonary effort is normal. No respiratory distress.     Breath sounds: Normal breath sounds. No stridor. No wheezing or rales.  Abdominal:     General: Bowel sounds are normal.     Palpations: Abdomen is soft.     Tenderness: There is no abdominal tenderness.  Musculoskeletal:     Right lower leg: No edema.     Left lower leg: No edema.  Skin:    General: Skin is warm and dry.  Neurological:     General: No focal deficit present.     Mental Status: He is alert and oriented to person, place, and time.  Psychiatric:        Mood and Affect: Mood normal.        Behavior: Behavior normal.      No results found for this or any previous visit (from the past 24 hour(s)).  No results found.   ASSESSMENT and PLAN  Problem List Items Addressed This Visit      Other   Continuous opioid dependence ([place])   Relevant Medications   HYDROcodone-acetaminophen (NORCO) 10-325 MG tablet   Controlled substance agreement signed   Relevant Medications   HYDROcodone-acetaminophen (NORCO) 10-325 MG tablet    Other Visit Diagnoses    Other chronic pain       Relevant Medications   HYDROcodone-acetaminophen (NORCO) 10-325 MG tablet      Plan . Medication refills sent . Discussed weaning will transition from 3 tabs tid to every other day 3 tabs alternating with 2.5 tabs Norco .  Will continue to wean as able   Return in about 2 months (around 09/21/2020).    Huston Foley Erabella Kuipers, FNP-BC [facility]

## 2020-07-22 NOTE — Patient Instructions (Signed)

## 2020-07-26 ENCOUNTER — Encounter: Payer: Self-pay | Admitting: Family

## 2020-07-26 ENCOUNTER — Ambulatory Visit: Payer: Medicare PPO | Admitting: Family

## 2020-07-26 ENCOUNTER — Other Ambulatory Visit: Payer: Self-pay

## 2020-07-26 VITALS — BP 120/78 | HR 80 | Ht 75.0 in | Wt 265.0 lb

## 2020-07-26 DIAGNOSIS — E1122 Type 2 diabetes mellitus with diabetic chronic kidney disease: Secondary | ICD-10-CM | POA: Diagnosis not present

## 2020-07-26 DIAGNOSIS — Z6833 Body mass index (BMI) 33.0-33.9, adult: Secondary | ICD-10-CM | POA: Diagnosis not present

## 2020-07-26 DIAGNOSIS — G4733 Obstructive sleep apnea (adult) (pediatric): Secondary | ICD-10-CM

## 2020-07-26 DIAGNOSIS — I152 Hypertension secondary to endocrine disorders: Secondary | ICD-10-CM | POA: Diagnosis not present

## 2020-07-26 DIAGNOSIS — E782 Mixed hyperlipidemia: Secondary | ICD-10-CM

## 2020-07-26 DIAGNOSIS — N1832 Chronic kidney disease, stage 3b: Secondary | ICD-10-CM

## 2020-07-26 NOTE — Patient Instructions (Addendum)
Medication Instructions:  Continue your current medications.   *If you need a refill on your cardiac medications before your next appointment, please call your pharmacy*  Lab Work: None ordered today.   Testing/Procedures: Your EKG today showed normal sinus rhythm.   Follow-Up: At [facility], you and your health needs are our priority.  As part of our continuing mission to provide you with exceptional heart care, we have created designated Provider Care Teams.  These Care Teams include your primary Cardiologist (physician) and Advanced Practice Providers (APPs -  Physician Assistants and Nurse Practitioners) who all work together to provide you with the care you need, when you need it.  We recommend signing up for the patient portal called "MyChart".  Sign up information is provided on this After Visit Summary.  MyChart is used to connect with patients for Virtual Visits (Telemedicine).  Patients are able to view lab/test results, encounter notes, upcoming appointments, etc.  Non-urgent messages can be sent to your provider as well.   To learn more about what you can do with MyChart, go to [email or web address]://[email or web address].com.    Your next appointment:   6 month(s)  The format for your next appointment:   In Person  Provider:   You may see Christopher End, MD or one of the following Advanced Practice Providers on your designated Care Team:    Christopher Berge, NP  Ryan Dunn, PA-C  Jacquelyn Visser, PA-C  Cadence Furth, PA-C  Aldine Chakraborty, NP   Other Instructions  Heart Healthy Diet Recommendations: A low-salt diet is recommended. Meats should be grilled, baked, or boiled. Avoid fried foods. Focus on lean protein sources like fish or chicken with vegetables and fruits. The American Heart Association is a GREAT resource!  American Heart Association Diet and Lifeystyle Recommendations   Exercise recommendations: The American Heart Association recommends 150 minutes of moderate  intensity exercise weekly. Try 30 minutes of moderate intensity exercise 4-5 times per week. This could include walking, jogging, or swimming.   

## 2020-07-26 NOTE — Progress Notes (Signed)
Office Visit    Patient Name: Evan Rivas Date of Encounter: 07/26/2020  Primary Care Provider:  Towanda Malkin, MD Primary Cardiologist:  Nelva Bush, MD Electrophysiologist:  None   Chief Complaint    Evan Rivas is a 70 y.o. 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 Anxiety    panic attacks  . Arthritis   . Cancer (HCC)    lip/basal  . Chronic kidney disease   . Depression   . Diabetes mellitus without complication ([place])    type 2  . GERD (gastroesophageal reflux disease)   . Gout   . History of kidney stones   . Hyperaldosteronism ([place])    bilateral adrenal hyperplasia  . Hyperlipidemia   . Hypertension   . Nerve entrapment syndrome of lower extremity, right 10/24/2019  . Pneumonia    2014 and 2018 ( 2018-klebsiella pneumonia   . Sleep apnea    Past Surgical History:  Procedure Laterality Date  . ARTHROSCOPIC REPAIR ACL Left   . CYSTOSCOPY W/ URETERAL STENT PLACEMENT Right 09/07/2014   Procedure: CYSTOSCOPY WITH STENT REPLACEMENT;  Surgeon: Hollice Espy, MD;  Location: [facility];  Service: Urology;  Laterality: Right;  . CYSTOSCOPY WITH INSERTION OF UROLIFT N/A 11/24/2019   Procedure: CYSTOSCOPY WITH INSERTION OF UROLIFT;  Surgeon: Hollice Espy, MD;  Location: [facility];  Service: Urology;  Laterality: N/A;  . CYSTOSCOPY/URETEROSCOPY/HOLMIUM LASER/STENT PLACEMENT Right 08/17/2014   Procedure: CYSTOSCOPY/URETEROSCOPY//STENT PLACEMENT/ attempt of lithotripsy;  Surgeon: Hollice Espy, MD;  Location: [facility];  Service: Urology;  Laterality: Right;  . HERNIA REPAIR    . JOINT REPLACEMENT    . MOHS SURGERY     lip  . TOTAL HIP ARTHROPLASTY Right 06/16/2020   Procedure: TOTAL HIP ARTHROPLASTY  ANTERIOR APPROACH;  Surgeon: Gaynelle Arabian, MD;  Location: [facility];  Service: Orthopedics;  Laterality: Right;  164mn  . TOTAL KNEE ARTHROPLASTY Left 04/24/2016   Procedure: LEFT TOTAL KNEE ARTHROPLASTY;  Surgeon: FGaynelle Arabian MD;  Location: [facility];  Service: Orthopedics;  Laterality: Left;  Adductor Block  . TOTAL KNEE ARTHROPLASTY Right 10/23/2016   Procedure: RIGHT TOTAL KNEE ARTHROPLASTY;  Surgeon: AGaynelle Arabian MD;  Location: [facility];  Service: Orthopedics;  Laterality: Right;  . URETEROSCOPY WITH HOLMIUM LASER LITHOTRIPSY Right 09/07/2014   Procedure: URETEROSCOPY WITH HOLMIUM LASER LITHOTRIPSY;  Surgeon: AHollice Espy MD;  Location: [facility];  Service: Urology;  Laterality: Right;  .Marland KitchenVASECTOMY      Allergies  Allergies  Allergen Reactions  . Celebrex [Celecoxib]     GI issues  . Tetanus Toxoids Other (See Comments)    unkown  . Tetanus-Diphtheria Toxoids Td     Unknown    History of Present Illness    Evan MACBRIDEis a 70y.o. male with a hx of HTN, diastolic dysfunction, hyperaldosteronism secondary to adrenal hyperplasia, HTN, HLD, DM2, CKDIIIa, cancer of the lip, anxiety, depression, gout, arthritis with chronic pain last seen 01/28/20 by Dr. ESaunders Revel  In 11/2019 his Spironolactone was increased due to LE edema and Metoprol decreased due to new first degree AV block. Subsequently, Spironolactone decreased due to bump in creatinine and potassium. At last office visit 03/19/19 his Amlodipine was discontinued as his blood pressure was well controlled and  he wished to consolidate his medications.  Seen by PCP 06/27/19. His home BP was averaging 140s/80s. His Losartan was transitioned to Diovan.  When seen in clinic 07/01/19 he was noted to be mildly hypertensive, but had not yet started his new medication.   He was admitted 09/04/19 for pyelonephritis. Treated with antibiotics.   Clinic visit 09/2019 at our office with BP at goal and no changes. When seen 01/28/20 his nephrologist  had adjusted Spironolactone due to renal dysfunction. His metoprolol was increased to 155m daily.   On 06/16/20 he underwent hip athroplasty.   He presents today for follow up. He is very pleased with his healing from his hip surgery and feels his mobility is much improved. Continues to enjoy spending time with his granddaughters.  Endorses eating a low-sodium, heart healthy diet. Continues to work on weight loss and is glad to be more mobile. Reports no shortness of breath nor dyspnea on exertion. Reports no chest pain, pressure, or tightness. No edema, orthopnea, PND. Reports no palpitations. Reports home BP readings are generally at goal of <130/80 and he does note it is elevated when he eats more salt than usual.   EKGs/Labs/Other Studies Reviewed:   The following studies were reviewed today:  EKG:  EKG is ordered today.  The ekg ordered today demonstrates NSR 80 bpm with low voltage QRS, poor R wave progression and no acute ST/T wave changes.  Recent Labs: 09/04/2019: B Natriuretic Peptide 182.0 09/08/2019: Magnesium 1.9 12/31/2019: TSH 3.98 06/15/2020: ALT 19 06/17/2020: BUN 29; Creatinine, Ser 1.24; Hemoglobin 11.1; Platelets 201; Potassium 5.3; Sodium 139  Recent Lipid Panel    Component Value Date/Time   CHOL 156 12/31/2019 1030   CHOL 140 09/17/2017 1014   TRIG 336 (H) 12/31/2019 1030   HDL 34 (L) 12/31/2019 1030   HDL 32 (L) 09/17/2017 1014   CHOLHDL 4.6 12/31/2019 1030   LDLCALC 82 12/31/2019 1030    Home Medications   Current Meds  Medication Sig  . aspirin EC 325 MG EC tablet Take 1 tablet (325 mg total) by mouth 2 (two) times daily. Then take one 81 mg aspirin once a day for three weeks. Then discontinue aspirin.  .Marland Kitchenatorvastatin (LIPITOR) 40 MG tablet Take 1 tablet (40 mg total) by mouth at bedtime.  . blood glucose meter kit and supplies Dispense based on patient and insurance preference. Use up to four times daily as directed. (FOR ICD-10 E10.9, E11.9).  . Dulaglutide  (TRULICITY) 1.5 MNA/3.5TDSOPN Inject 1.5 mg into the skin once a week.  . DULoxetine (CYMBALTA) 60 MG capsule Take 1 capsule (60 mg total) by mouth daily.  .Marland Kitchengabapentin (NEURONTIN) 300 MG capsule Take 300 mg by mouth 3 (three) times daily.  .Marland KitchenglipiZIDE (GLUCOTROL) 10 MG tablet TAKE 1 TABLET (10 MG TOTAL) BY MOUTH DAILY BEFORE BREAKFAST.  .Marland Kitchenglucose blood (ACCU-CHEK AVIVA PLUS) test strip Use as instructed  . glucose blood (TRUE METRIX BLOOD GLUCOSE TEST) test strip Use as instructed  . HYDROcodone-acetaminophen (NORCO) 10-325 MG tablet Every other day 3 tabs one day, 2.5 tabs the next day  . hydrOXYzine (ATARAX/VISTARIL) 25 MG tablet Take 25 mg by mouth 3 (three) times daily as needed for anxiety.   . metFORMIN (GLUCOPHAGE) 1000 MG tablet Take 1 tablet (1,000 mg total) by mouth 2 (two) times daily with a meal.  . metoprolol succinate (TOPROL-XL) 100 MG 24 hr tablet Take 100 mg by mouth daily. Take with or immediately following a meal.  .  Multiple Vitamin (MULTIVITAMIN) tablet Take 1 tablet by mouth daily.  [name] spironolactone (ALDACTONE) 25 MG tablet Take 25 mg by mouth daily.  [name] tiZANidine (ZANAFLEX) 4 MG tablet Take 4 mg by mouth every 6 (six) hours as needed for muscle spasms.  . TRUEplus Lancets 33G MISC   . valsartan-hydrochlorothiazide (DIOVAN-HCT) 320-12.5 MG tablet TAKE 1 TABLET EVERY DAY    Review of Systems   All other systems reviewed and are otherwise negative except as noted above.  Physical Exam    VS:  BP 120/78 (BP Location: Left Arm, Patient Position: Sitting, Cuff Size: Normal)   Pulse 80   Ht '6\' 3"'  (1.905 m)   Wt 265 lb (120.2 kg)   SpO2 97%   BMI 33.12 kg/m  , BMI Body mass index is 33.12 kg/m. GEN: Well nourished, overweight, well developed, in no acute distress. HEENT: normal. Neck: Supple, no JVD, carotid bruits, or masses. Cardiac: RRR, no murmurs, rubs, or gallops. No clubbing, cyanosis, edema.  Radials/PT 2+ and equal bilaterally.  Respiratory:  Respirations  regular and unlabored, clear to auscultation bilaterally. GI: Soft, nontender, nondistended MS: No deformity or atrophy. Skin: Warm and dry, no rash. Neuro:  Strength and sensation are intact. Psych: Normal affect.  Assessment & Plan    1. Secondary hypertension due to hyperaldosteronism - BP well controlled. Continue current antihypertensive regimen including Toprol 177m QD, Spironolactone 2138mQD, Valsartan-HCTZ 320-12.38m86mD. Heart healthy diet and regular cardiovascular exercise encouraged.   2. DM2 - Improved control by most recent A1c. Continue to follow with primary care.  3. CKD 3 -Careful titration of diuretics and antihypertensive agents. Continue to follow with nephrology.   4. OSA  - Not wearing CPAP. CPAP compliance encouraged.  5. HLD - Continue atorvastatin 40 mg daily.  6. Obesity - Weight loss via diet and exercise encouraged. Discussed the impact being overweight would have on cardiovascular risk.  Disposition:  Follow up in 6 month(s) with Dr. EndSaunders Revel APP.    CaiLoel DubonnetP 07/26/2020, 11:56 AM

## 2020-08-10 ENCOUNTER — Telehealth: Payer: Self-pay

## 2020-08-10 NOTE — Progress Notes (Signed)
Chronic Care Management Pharmacy Assistant   Name: Evan Rivas  MRN: [id number] DOB: 1950-07-11  Reason for Encounter:Hypertension Disease State Call.   Recent office visits:  07/22/2020 Huston Foley Just FNP (PCP Office)  Recent consult visits:  08/02/2020 Dr Candiss Norse MD (Nephrology) 07/26/2020 [name] NP (cardiology)   Hospital visits:  None in previous 6 months  Medications: Outpatient Encounter Medications as of 08/10/2020  Medication Sig  . aspirin EC 325 MG EC tablet Take 1 tablet (325 mg total) by mouth 2 (two) times daily. Then take one 81 mg aspirin once a day for three weeks. Then discontinue aspirin.  [name] atorvastatin (LIPITOR) 40 MG tablet Take 1 tablet (40 mg total) by mouth at bedtime.  . blood glucose meter kit and supplies Dispense based on patient and insurance preference. Use up to four times daily as directed. (FOR ICD-10 E10.9, E11.9).  . Dulaglutide (TRULICITY) 1.5 CL/2.7NT SOPN Inject 1.5 mg into the skin once a week.  . DULoxetine (CYMBALTA) 60 MG capsule Take 1 capsule (60 mg total) by mouth daily.  . fluticasone (FLONASE) 50 MCG/ACT nasal spray Place 2 sprays into both nostrils daily. 2 sprays (Patient not taking: Reported on 07/26/2020)  . gabapentin (NEURONTIN) 300 MG capsule Take 300 mg by mouth 3 (three) times daily.  [name] glipiZIDE (GLUCOTROL) 10 MG tablet TAKE 1 TABLET (10 MG TOTAL) BY MOUTH DAILY BEFORE BREAKFAST.  [name] glucose blood (ACCU-CHEK AVIVA PLUS) test strip Use as instructed  . glucose blood (TRUE METRIX BLOOD GLUCOSE TEST) test strip Use as instructed  . HYDROcodone-acetaminophen (NORCO) 10-325 MG tablet Every other day 3 tabs one day, 2.5 tabs the next day  . hydrOXYzine (ATARAX/VISTARIL) 25 MG tablet Take 25 mg by mouth 3 (three) times daily as needed for anxiety.   . metFORMIN (GLUCOPHAGE) 1000 MG tablet Take 1 tablet (1,000 mg total) by mouth 2 (two) times daily with a meal.  . metoprolol succinate (TOPROL-XL) 100 MG 24 hr tablet Take 100  mg by mouth daily. Take with or immediately following a meal.  . Multiple Vitamin (MULTIVITAMIN) tablet Take 1 tablet by mouth daily.  [name] spironolactone (ALDACTONE) 25 MG tablet Take 25 mg by mouth daily.  [name] tiZANidine (ZANAFLEX) 4 MG tablet Take 4 mg by mouth every 6 (six) hours as needed for muscle spasms.  . TRUEplus Lancets 33G MISC   . valsartan-hydrochlorothiazide (DIOVAN-HCT) 320-12.5 MG tablet TAKE 1 TABLET EVERY DAY   No facility-administered encounter medications on file as of 08/10/2020.     Star Rating Drugs: Atorvastatin 40 mg last filled on 04/22/2020 for 90 day supply at [facility]. Trulicity 7.0YF last filled on 10/03/2018 for 84 day supply at [facility]. Glipizide 10 mg last filled on 04/21/2020 for 90 day supply at [facility]. Metformin 1000 mg last filled on 04/23/2020 for 90 day supply at [facility]. Valsartan-HCTZ 320-12.5 mg last filled on 04/26/2020 for 90 day supply at [facility].  Reviewed chart prior to disease state call. Spoke with patient regarding BP  Recent Office Vitals: BP Readings from Last 3 Encounters:  07/26/20 120/78  07/22/20 138/70  06/17/20 134/68   Pulse Readings from Last 3 Encounters:  07/26/20 80  07/22/20 94  06/17/20 92    Wt Readings from Last 3 Encounters:  07/26/20 265 lb (120.2 kg)  07/22/20 264 lb 4.8 oz (119.9 kg)  06/16/20 262 lb (118.8 kg)     Kidney Function Lab Results  Component Value Date/Time   CREATININE 1.24 06/17/2020  02:55 AM   CREATININE 1.67 (H) 06/15/2020 09:40 AM   CREATININE 1.35 (H) 06/14/2020 11:29 AM   CREATININE 1.58 (H) 03/16/2020 12:00 AM   GFRNONAA >60 06/17/2020 02:55 AM   GFRNONAA 44 (L) 03/16/2020 12:00 AM   GFRAA 51 (L) 03/16/2020 12:00 AM    BMP Latest Ref Rng & Units 06/17/2020 06/15/2020 06/14/2020  Glucose 70 - 99 mg/dL 224(H) 155(H) 150(H)  BUN 8 - 23 mg/dL 29(H) 36(H) 32(H)  Creatinine 0.61 - 1.24 mg/dL 1.24 1.67(H) 1.35(H)  BUN/Creat Ratio 6 - 22 (calc) - -  24(H)  Sodium 135 - 145 mmol/L 139 139 142  Potassium 3.5 - 5.1 mmol/L 5.3(H) 5.1 5.6(H)  Chloride 98 - 111 mmol/L 107 107 106  CO2 22 - 32 mmol/L 21(L) 24 26  Calcium 8.9 - 10.3 mg/dL 9.0 9.3 9.6    . Current antihypertensive regimen:   Metoprolol XL 100 mg daily   Spironolactone 25 mg daily   Valsartan-HCTZ 320-12.5 mg daily  . How often are you checking your Blood Pressure? infrequently . Current home BP readings:  o Patient states his blood pressure was 120/78 at his cardiologist appointment and EKG was good.Patient states he believe his blood pressure is doing good. . What recent interventions/DTPs have been made by any provider to improve Blood Pressure control since last CPP Visit:  Patient states his nephrologist hold his sprionolactone for 1 week due to his potassium levels. Patient states he has a follow up to get his labs redone and will access if he needs to restart his spironolactone. . Any recent hospitalizations or ED visits since last visit with CPP? No . What diet changes have been made to improve Blood Pressure Control?  o No changes indicated. . What exercise is being done to improve your Blood Pressure Control?  o No Changes indicated.  Adherence Review: Is the patient currently on ACE/ARB medication? Yes Does the patient have >5 day gap between last estimated fill dates? Yes  [place] Pharmacist Assistant [phone]

## 2020-08-17 ENCOUNTER — Telehealth: Payer: Self-pay

## 2020-08-17 NOTE — Progress Notes (Signed)
Left a voice message to inform patient per clinical pharmacist, PCP office received a supply of his Trulicity on 02/77/4128.Patient can pick it up at the front desk.Inform patient patient I will call [employer] to have them change the delivery location to patient home address.  Per [facility], next shipment of Trulicity will go to patient home address.  [place] Pharmacist Assistant [phone]

## 2020-08-24 ENCOUNTER — Encounter: Payer: Self-pay | Admitting: Unknown Physician Specialty

## 2020-08-24 ENCOUNTER — Ambulatory Visit: Payer: Medicare PPO | Admitting: Unknown Physician Specialty

## 2020-08-24 ENCOUNTER — Other Ambulatory Visit: Payer: Self-pay

## 2020-08-24 DIAGNOSIS — Z79899 Other long term (current) drug therapy: Secondary | ICD-10-CM | POA: Diagnosis not present

## 2020-08-24 DIAGNOSIS — F1120 Opioid dependence, uncomplicated: Secondary | ICD-10-CM | POA: Diagnosis not present

## 2020-08-24 DIAGNOSIS — M545 Low back pain, unspecified: Secondary | ICD-10-CM

## 2020-08-24 DIAGNOSIS — G8929 Other chronic pain: Secondary | ICD-10-CM | POA: Diagnosis not present

## 2020-08-24 MED ORDER — HYDROCODONE-ACETAMINOPHEN 10-325 MG PO TABS: ORAL_TABLET | ORAL | 0 refills | Status: DC

## 2020-08-24 MED ORDER — GABAPENTIN 600 MG PO TABS: 600.0000 mg | ORAL_TABLET | Freq: Three times a day (TID) | ORAL | 1 refills | Status: DC

## 2020-08-24 NOTE — Assessment & Plan Note (Signed)
Pt on chronic opioids but willing to taper.  Agree to taper 10% per month.  Last month was #90 to #75 and as a more rapid taper than we agreed on, will stick to the #75 dose this month and taper to #68 next month.  Increase Gabapentin from 300 mg TID to 600 mg TID. [place] data base PMP aware checked.

## 2020-08-24 NOTE — Progress Notes (Signed)
BP 132/70   Pulse 96   Temp 98.2 F (36.8 C) (Oral)   Resp 18   Ht 6\' 3"  (1.905 m)   Wt 272 lb 4.8 oz (123.5 kg)   SpO2 98%   BMI 34.04 kg/m    Subjective:    Patient ID: Evan Rivas, male    DOB: 1950/07/28, 70 y.o.   MRN: [id number]  HPI: Evan Rivas is a 70 y.o. male  Chief Complaint  Patient presents with  . Pain    Follow up, medication refill   Pain Pt is s/p bilateral knee replacements and rt hip replacement.  Pt has been on chronic opioids for 20 years.  He states he mostly takes it for his back pain.  He is compliant with monitoring.  Taking Gabapentin 300 mg TID from pain management. He would like to try to taper as opposed to going to pain management.  Last visit Norco got tapered from #90-#75.  States the pain meds allow him to do things such as go to the store  Relevant past medical, surgical, family and social history reviewed and updated as indicated. Interim medical history since our last visit reviewed. Allergies and medications reviewed and updated.  Review of Systems  Constitutional: Negative.   Respiratory: Negative.   Cardiovascular: Negative.   Neurological: Negative.   Psychiatric/Behavioral: Negative.     Per HPI unless specifically indicated above     Objective:    BP 132/70   Pulse 96   Temp 98.2 F (36.8 C) (Oral)   Resp 18   Ht 6\' 3"  (1.905 m)   Wt 272 lb 4.8 oz (123.5 kg)   SpO2 98%   BMI 34.04 kg/m   Wt Readings from Last 3 Encounters:  08/24/20 272 lb 4.8 oz (123.5 kg)  07/26/20 265 lb (120.2 kg)  07/22/20 264 lb 4.8 oz (119.9 kg)    Physical Exam Constitutional:      General: He is not in acute distress.    Appearance: Normal appearance. He is well-developed.  HENT:     Head: Normocephalic and atraumatic.  Eyes:     General: Lids are normal. No scleral icterus.       Right eye: No discharge.        Left eye: No discharge.     Conjunctiva/sclera: Conjunctivae normal.  Neck:     Vascular: No carotid  bruit or JVD.  Cardiovascular:     Rate and Rhythm: Normal rate and regular rhythm.     Heart sounds: Normal heart sounds.  Pulmonary:     Effort: Pulmonary effort is normal. No respiratory distress.     Breath sounds: Normal breath sounds.  Abdominal:     Palpations: There is no hepatomegaly or splenomegaly.  Musculoskeletal:        General: Normal range of motion.     Cervical back: Normal range of motion and neck supple.  Skin:    General: Skin is warm and dry.     Coloration: Skin is not pale.     Findings: No rash.  Neurological:     Mental Status: He is alert and oriented to person, place, and time.  Psychiatric:        Behavior: Behavior normal.        Thought Content: Thought content normal.        Judgment: Judgment normal.     Results for orders placed or performed during the hospital encounter of 06/16/20  Glucose, capillary  Result Value Ref Range   Glucose-Capillary 189 (H) 70 - 99 mg/dL  Glucose, capillary  Result Value Ref Range   Glucose-Capillary 130 (H) 70 - 99 mg/dL  Glucose, capillary  Result Value Ref Range   Glucose-Capillary 225 (H) 70 - 99 mg/dL  CBC  Result Value Ref Range   WBC 9.0 4.0 - 10.5 K/uL   RBC 3.48 (L) 4.22 - 5.81 MIL/uL   Hemoglobin 11.1 (L) 13.0 - 17.0 g/dL   HCT 35.0 (L) 39.0 - 52.0 %   MCV 100.6 (H) 80.0 - 100.0 fL   MCH 31.9 26.0 - 34.0 pg   MCHC 31.7 30.0 - 36.0 g/dL   RDW 12.9 11.5 - 15.5 %   Platelets 201 150 - 400 K/uL   nRBC 0.0 0.0 - 0.2 %  Basic metabolic panel  Result Value Ref Range   Sodium 139 135 - 145 mmol/L   Potassium 5.3 (H) 3.5 - 5.1 mmol/L   Chloride 107 98 - 111 mmol/L   CO2 21 (L) 22 - 32 mmol/L   Glucose, Bld 224 (H) 70 - 99 mg/dL   BUN 29 (H) 8 - 23 mg/dL   Creatinine, Ser 1.24 0.61 - 1.24 mg/dL   Calcium 9.0 8.9 - 10.3 mg/dL   GFR, Estimated >60 >60 mL/min   Anion gap 11 5 - 15  Glucose, capillary  Result Value Ref Range   Glucose-Capillary 247 (H) 70 - 99 mg/dL  Glucose, capillary  Result  Value Ref Range   Glucose-Capillary 185 (H) 70 - 99 mg/dL      Assessment & Plan:   Problem List Items Addressed This Visit      Unprioritized   Chronic low back pain    Pt on chronic opioids but willing to taper.  Agree to taper 10% per month.  Last month was #90 to #75 and as a more rapid taper than we agreed on, will stick to the #75 dose this month and taper to #68 next month.  Increase Gabapentin from 300 mg TID to 600 mg TID. [place] data base PMP aware checked.        Relevant Medications   HYDROcodone-acetaminophen (NORCO) 10-325 MG tablet (Start on 08/28/2020)   Continuous opioid dependence ([place])   Relevant Medications   HYDROcodone-acetaminophen (NORCO) 10-325 MG tablet (Start on 08/28/2020)   Controlled substance agreement signed   Relevant Medications   HYDROcodone-acetaminophen (NORCO) 10-325 MG tablet (Start on 08/28/2020)    Other Visit Diagnoses    Other chronic pain       Relevant Medications   HYDROcodone-acetaminophen (NORCO) 10-325 MG tablet (Start on 08/28/2020)   gabapentin (NEURONTIN) 600 MG tablet      F/u in 4 days for diabetes and pain management  Follow up plan: Return in about 4 weeks (around 09/21/2020), or f/u of DM and chronic pain - 2 time slots please.

## 2020-08-25 ENCOUNTER — Encounter: Payer: Self-pay | Admitting: Dermatology

## 2020-08-25 ENCOUNTER — Ambulatory Visit: Payer: Medicare PPO | Admitting: Dermatology

## 2020-08-25 DIAGNOSIS — L820 Inflamed seborrheic keratosis: Secondary | ICD-10-CM

## 2020-08-25 DIAGNOSIS — B353 Tinea pedis: Secondary | ICD-10-CM | POA: Diagnosis not present

## 2020-08-25 DIAGNOSIS — D229 Melanocytic nevi, unspecified: Secondary | ICD-10-CM

## 2020-08-25 DIAGNOSIS — L821 Other seborrheic keratosis: Secondary | ICD-10-CM

## 2020-08-25 DIAGNOSIS — L570 Actinic keratosis: Secondary | ICD-10-CM | POA: Diagnosis not present

## 2020-08-25 DIAGNOSIS — D1800 Hemangioma unspecified site: Secondary | ICD-10-CM

## 2020-08-25 DIAGNOSIS — Z85828 Personal history of other malignant neoplasm of skin: Secondary | ICD-10-CM

## 2020-08-25 DIAGNOSIS — Z1283 Encounter for screening for malignant neoplasm of skin: Secondary | ICD-10-CM

## 2020-08-25 DIAGNOSIS — L814 Other melanin hyperpigmentation: Secondary | ICD-10-CM

## 2020-08-25 DIAGNOSIS — L578 Other skin changes due to chronic exposure to nonionizing radiation: Secondary | ICD-10-CM

## 2020-08-25 MED ORDER — KETOCONAZOLE 2 % EX CREA: TOPICAL_CREAM | CUTANEOUS | 6 refills | Status: DC

## 2020-08-25 NOTE — Patient Instructions (Signed)

## 2020-08-25 NOTE — Progress Notes (Addendum)
New Patient Visit  Subjective  Evan Rivas is a 70 y.o. male who presents for the following: Annual Exam (Hx BCC - R upper lip tx with MOHS in 2015). The patient presents for Total-Body Skin Exam (TBSE) for skin cancer screening and mole check.  The following portions of the chart were reviewed this encounter and updated as appropriate:   Tobacco  Allergies  Meds  Problems  Med Hx  Surg Hx  Fam Hx     Review of Systems:  No other skin or systemic complaints except as noted in HPI or Assessment and Plan.  Objective  Well appearing patient in no apparent distress; mood and affect are within normal limits.  A full examination was performed including scalp, head, eyes, ears, nose, lips, neck, chest, axillae, abdomen, back, buttocks, bilateral upper extremities, bilateral lower extremities, hands, feet, fingers, toes, fingernails, and toenails. All findings within normal limits unless otherwise noted below.  Objective  Nose x 2 (2): Erythematous thin papules/macules with gritty scale.   Objective  R nasolabial x 1: Erythematous keratotic or waxy stuck-on papule or plaque.   Objective  B/L feet: Scale of the feet.  Assessment & Plan  AK (actinic keratosis) (2) Nose x 2  Destruction of lesion - Nose x 2 Complexity: simple   Destruction method: cryotherapy   Informed consent: discussed and consent obtained   Timeout:  patient name, date of birth, surgical site, and procedure verified Lesion destroyed using liquid nitrogen: Yes   Region frozen until ice ball extended beyond lesion: Yes   Outcome: patient tolerated procedure well with no complications   Post-procedure details: wound care instructions given    Inflamed seborrheic keratosis R nasolabial x 1  Destruction of lesion - R nasolabial x 1 Complexity: simple   Destruction method: cryotherapy   Informed consent: discussed and consent obtained   Timeout:  patient name, date of birth, surgical site, and  procedure verified Lesion destroyed using liquid nitrogen: Yes   Region frozen until ice ball extended beyond lesion: Yes   Outcome: patient tolerated procedure well with no complications   Post-procedure details: wound care instructions given    Tinea pedis of left foot B/L feet Chronic, persistent -  Start Ketoconazole 2% cream QHS.  Reordered Medications ketoconazole (NIZORAL) 2 % cream  Lentigines - Scattered tan macules - Due to sun exposure - Benign-appering, observe - Recommend daily broad spectrum sunscreen SPF 30+ to sun-exposed areas, reapply every 2 hours as needed. - Call for any changes  Seborrheic Keratoses - Stuck-on, waxy, tan-brown papules and/or plaques  - Benign-appearing - Discussed benign etiology and prognosis. - Observe - Call for any changes  Melanocytic Nevi - Tan-brown and/or pink-flesh-colored symmetric macules and papules - Benign appearing on exam today - Observation - Call clinic for new or changing moles - Recommend daily use of broad spectrum spf 30+ sunscreen to sun-exposed areas.   Hemangiomas - Red papules - Discussed benign nature - Observe - Call for any changes  Actinic Damage - Chronic condition, secondary to cumulative UV/sun exposure - diffuse scaly erythematous macules with underlying dyspigmentation - Recommend daily broad spectrum sunscreen SPF 30+ to sun-exposed areas, reapply every 2 hours as needed.  - Staying in the shade or wearing long sleeves, sun glasses (UVA+UVB protection) and wide brim hats (4-inch brim around the entire circumference of the hat) are also recommended for sun protection.  - Call for new or changing lesions.  History of Basal Cell Carcinoma of the  Skin - No evidence of recurrence today - Recommend regular full body skin exams - Recommend daily broad spectrum sunscreen SPF 30+ to sun-exposed areas, reapply every 2 hours as needed.  - Call if any new or changing lesions are noted between office  visits  Skin cancer screening performed today.  Return in about 1 year (around 08/25/2021) for TBSE.  Luther Redo, CMA, am acting as scribe for Sarina Ser, MD .  Documentation: I have reviewed the above documentation for accuracy and completeness, and I agree with the above.  Sarina Ser, MD

## 2020-08-30 ENCOUNTER — Encounter: Payer: Self-pay | Admitting: Dermatology

## 2020-08-31 ENCOUNTER — Telehealth: Payer: Self-pay

## 2020-08-31 NOTE — Progress Notes (Signed)
08/24/2020 Kathrine Haddock NP (PCP Office) Increase Gabapentin from 300 mg TID to 600 mg TID.Hydrocodone-acetaminophen 10-325 mg , patient agree to  taper 10% per month.  Last month was #90 to #75 and as a more rapid taper than we agreed on, will stick to the #75 dose this month and taper to #68 next month  Spoke to patient to see if he was doing okay with medication change of his Gabapentin and with the tapering of Hydrocodone-acetaminophen.  Patient states he is doing good with tampering his Hydrocodone-acetaminophen but is unsure of any issue with gabapentin because he has not receive his medication in the mail from [facility].  [place] Pharmacist Assistant [phone]

## 2020-09-02 NOTE — Addendum Note (Signed)
Addended by: Ralene Bathe on: 09/02/2020 10:28 AM   Modules accepted: Level of Service

## 2020-09-04 ENCOUNTER — Other Ambulatory Visit: Payer: Self-pay | Admitting: Internal Medicine

## 2020-09-08 ENCOUNTER — Other Ambulatory Visit: Payer: Self-pay | Admitting: Family

## 2020-09-08 NOTE — Telephone Encounter (Signed)
Please advise if ok to refill Historical Provider. Pt taking Spironolactone 25 mg qd.

## 2020-09-11 ENCOUNTER — Other Ambulatory Visit: Payer: Self-pay | Admitting: Family Medicine

## 2020-09-11 DIAGNOSIS — I10 Essential (primary) hypertension: Secondary | ICD-10-CM

## 2020-09-13 ENCOUNTER — Other Ambulatory Visit: Payer: Self-pay

## 2020-09-14 ENCOUNTER — Ambulatory Visit: Payer: Medicare PPO | Admitting: Family Medicine

## 2020-09-14 ENCOUNTER — Ambulatory Visit: Payer: Medicare PPO | Admitting: Physician Assistant

## 2020-09-14 MED ORDER — GLIPIZIDE 10 MG PO TABS
10.0000 mg | ORAL_TABLET | Freq: Every day | ORAL | 1 refills | Status: DC
Start: 2020-09-14 — End: 2021-02-09

## 2020-09-14 MED ORDER — ATORVASTATIN CALCIUM 40 MG PO TABS: 40.0000 mg | ORAL_TABLET | Freq: Every evening | ORAL | 3 refills | Status: DC

## 2020-09-21 ENCOUNTER — Ambulatory Visit: Payer: Medicare PPO | Admitting: Unknown Physician Specialty

## 2020-09-21 ENCOUNTER — Other Ambulatory Visit: Payer: Self-pay

## 2020-09-21 VITALS — BP 130/80 | HR 87 | Temp 98.1°F | Resp 16 | Ht 75.0 in | Wt 272.5 lb

## 2020-09-21 DIAGNOSIS — E1169 Type 2 diabetes mellitus with other specified complication: Secondary | ICD-10-CM | POA: Diagnosis not present

## 2020-09-21 DIAGNOSIS — E669 Obesity, unspecified: Secondary | ICD-10-CM

## 2020-09-21 DIAGNOSIS — G4733 Obstructive sleep apnea (adult) (pediatric): Secondary | ICD-10-CM | POA: Diagnosis not present

## 2020-09-21 DIAGNOSIS — G8929 Other chronic pain: Secondary | ICD-10-CM

## 2020-09-21 DIAGNOSIS — I10 Essential (primary) hypertension: Secondary | ICD-10-CM

## 2020-09-21 DIAGNOSIS — E269 Hyperaldosteronism, unspecified: Secondary | ICD-10-CM

## 2020-09-21 DIAGNOSIS — E119 Type 2 diabetes mellitus without complications: Secondary | ICD-10-CM

## 2020-09-21 DIAGNOSIS — Z1211 Encounter for screening for malignant neoplasm of colon: Secondary | ICD-10-CM

## 2020-09-21 DIAGNOSIS — R251 Tremor, unspecified: Secondary | ICD-10-CM

## 2020-09-21 DIAGNOSIS — F1120 Opioid dependence, uncomplicated: Secondary | ICD-10-CM

## 2020-09-21 DIAGNOSIS — Z79899 Other long term (current) drug therapy: Secondary | ICD-10-CM

## 2020-09-21 LAB — COMPREHENSIVE METABOLIC PANEL
AG Ratio: 1.5 (calc) (ref 1.0–2.5)
ALT: 30 U/L (ref 9–46)
AST: 17 U/L (ref 10–35)
Albumin: 4 g/dL (ref 3.6–5.1)
Alkaline phosphatase (APISO): 84 U/L (ref 35–144)
BUN/Creatinine Ratio: 20 (calc) (ref 6–22)
BUN: 28 mg/dL — ABNORMAL HIGH (ref 7–25)
CO2: 28 mmol/L (ref 20–32)
Calcium: 9.3 mg/dL (ref 8.6–10.3)
Chloride: 104 mmol/L (ref 98–110)
Creat: 1.39 mg/dL — ABNORMAL HIGH (ref 0.70–1.25)
Globulin: 2.6 g/dL (calc) (ref 1.9–3.7)
Glucose, Bld: 198 mg/dL — ABNORMAL HIGH (ref 65–99)
Potassium: 5.3 mmol/L (ref 3.5–5.3)
Sodium: 140 mmol/L (ref 135–146)
Total Bilirubin: 0.3 mg/dL (ref 0.2–1.2)
Total Protein: 6.6 g/dL (ref 6.1–8.1)

## 2020-09-21 LAB — POCT GLYCOSYLATED HEMOGLOBIN (HGB A1C): Hemoglobin A1C: 7.6 % — AB (ref 4.0–5.6)

## 2020-09-21 LAB — LIPID PANEL
Cholesterol: 154 mg/dL (ref <200)
HDL: 35 mg/dL — ABNORMAL LOW (ref >=40)
LDL Cholesterol (Calc): 86 mg/dL (calc)
Non-HDL Cholesterol (Calc): 119 mg/dL (calc) (ref <130)
Total CHOL/HDL Ratio: 4.4 (calc) (ref <5.0)
Triglycerides: 237 mg/dL — ABNORMAL HIGH (ref <150)

## 2020-09-21 MED ORDER — HYDROCODONE-ACETAMINOPHEN 10-325 MG PO TABS: ORAL_TABLET | ORAL | 0 refills | Status: DC

## 2020-09-21 NOTE — Assessment & Plan Note (Signed)
Working with nephrology

## 2020-09-21 NOTE — Assessment & Plan Note (Addendum)
Not at goal.  Trouble affording medications.  Would like to start SGLT2 but unnable to afford.  Will refer to pharmacy

## 2020-09-21 NOTE — Progress Notes (Signed)
BP 130/80   Pulse 87   Temp 98.1 F (36.7 C) (Oral)   Resp 16   Ht 6\' 3"  (1.905 m)   Wt 272 lb 8 oz (123.6 kg)   SpO2 95%   BMI 34.06 kg/m    Subjective:    Patient ID: Evan Rivas, male    DOB: 02-Apr-1951, 70 y.o.   MRN: [id number]  HPI: Evan Rivas is a 70 y.o. male  Chief Complaint  Patient presents with   Pain   Diabetes   Chronic low back pain Pt is here for f/u.  Working on weaning off his narcotic medications.  Taking #75 of Norco (down from #90 at the start).  Agreed to wean 10%/month.  Pt states he is doing well without problems with weaning.  Agreed to cut back to #68 this month  Tremor Developed a tremor in his hand that comes and goes.  States it happens most when driving and holding arms outstretched. Worse when tired.  Better when rested or gross motor movement.    Diabetes: On Trulicity, Glucophage, Metformin.  Recent increase of Trulicity Using medications without difficulties No hypoglycemic episodes No hyperglycemic episodes Blood Sugars averaging: 146 this morning eye exam within last year Last Hgb A1C: 7.4 and 7.6 today  Hypertension Using medications without difficulty Average home BPs: 130/88 this AM which is typical  No problems or lightheadedness No chest pain with exertion or shortness of breath No Edema   Hyperlipidemia Using medications without problems: No Muscle aches    Relevant past medical, surgical, family and social history reviewed and updated as indicated. Interim medical history since our last visit reviewed. Allergies and medications reviewed and updated.  Review of Systems  Per HPI unless specifically indicated above     Objective:    BP 130/80   Pulse 87   Temp 98.1 F (36.7 C) (Oral)   Resp 16   Ht 6\' 3"  (1.905 m)   Wt 272 lb 8 oz (123.6 kg)   SpO2 95%   BMI 34.06 kg/m   Wt Readings from Last 3 Encounters:  09/21/20 272 lb 8 oz (123.6 kg)  08/24/20 272 lb 4.8 oz (123.5 kg)  07/26/20 265  lb (120.2 kg)    Physical Exam Constitutional:      General: He is not in acute distress.    Appearance: Normal appearance. He is well-developed.  HENT:     Head: Normocephalic and atraumatic.  Eyes:     General: Lids are normal. No scleral icterus.       Right eye: No discharge.        Left eye: No discharge.     Conjunctiva/sclera: Conjunctivae normal.  Cardiovascular:     Rate and Rhythm: Normal rate.  Pulmonary:     Effort: Pulmonary effort is normal.  Abdominal:     Palpations: There is no hepatomegaly or splenomegaly.  Musculoskeletal:        General: Normal range of motion.  Skin:    Coloration: Skin is not pale.     Findings: No rash.  Neurological:     Mental Status: He is alert and oriented to person, place, and time.     Comments: Fine tremor with fine motor movements  Psychiatric:        Behavior: Behavior normal.        Thought Content: Thought content normal.        Judgment: Judgment normal.    Results for orders placed or  performed in visit on 09/21/20  POCT HgB A1C  Result Value Ref Range   Hemoglobin A1C 7.6 (A) 4.0 - 5.6 %   HbA1c POC (<> result, manual entry)     HbA1c, POC (prediabetic range)     HbA1c, POC (controlled diabetic range)        Assessment & Plan:   Problem List Items Addressed This Visit       Unprioritized   Continuous opioid dependence ([place])    Decrease Norco to #68.  PMP data base reviewed       Relevant Medications   HYDROcodone-acetaminophen (NORCO) 10-325 MG tablet   Controlled substance agreement signed   Relevant Medications   HYDROcodone-acetaminophen ([place]) 10-325 MG tablet   Diabetes mellitus type 2 in obese [facility])    Not at goal.  Trouble affording medications.  Would like to start SGLT2 but unnable to afford.  Will refer to pharmacy       Essential hypertension    Stable, continue present medications.         Relevant Orders   Lipid panel   Hyperaldosteronism ([place])    Working with nephrology        OSA (obstructive sleep apnea)    CPAP not in use.  States it never did anything for him.  Referring to neurology for tremor.  Will also get opinion on sleep apnea       Relevant Orders   Ambulatory referral to Neurology   Other Visit Diagnoses     Type 2 diabetes mellitus without complication, without long-term current use of insulin ([place])    -  Primary   Relevant Orders   POCT HgB A1C (Completed)   AMB Referral to [facility]   Comprehensive metabolic panel   Lipid panel   Tremor       New problem.  Will refer to neurology for futher assessment   Relevant Orders   Ambulatory referral to Neurology   Other chronic pain       Relevant Medications   HYDROcodone-acetaminophen (NORCO) 10-325 MG tablet   Colon cancer screening       Relevant Orders   Cologuard        Follow up plan: Return in about 4 weeks (around 10/19/2020).

## 2020-09-21 NOTE — Assessment & Plan Note (Addendum)
CPAP not in use.  States it never did anything for him.  Referring to neurology for tremor.  Will also get opinion on sleep apnea

## 2020-09-21 NOTE — Assessment & Plan Note (Signed)
Decrease Norco to #68.  PMP data base reviewed

## 2020-09-21 NOTE — Assessment & Plan Note (Signed)
Stable, continue present medications.   

## 2020-09-22 ENCOUNTER — Other Ambulatory Visit: Payer: Self-pay | Admitting: Family Medicine

## 2020-09-22 ENCOUNTER — Telehealth: Payer: Self-pay | Admitting: *Deleted

## 2020-09-22 DIAGNOSIS — F419 Anxiety disorder, unspecified: Secondary | ICD-10-CM

## 2020-09-22 DIAGNOSIS — F331 Major depressive disorder, recurrent, moderate: Secondary | ICD-10-CM

## 2020-09-22 MED ORDER — DULOXETINE HCL 60 MG PO CPEP: 60.0000 mg | ORAL_CAPSULE | Freq: Every day | ORAL | 0 refills | Status: DC

## 2020-09-22 NOTE — Telephone Encounter (Signed)
Copied from [place] [id number]. Topic: Quick Communication - Rx Refill/Question >> Sep 22, 2020  9:02 AM Leward Quan A wrote: Medication: DULoxetine (CYMBALTA) 60 MG capsule  Has the patient contacted their pharmacy? Yes.   (Agent: If no, request that the patient contact the pharmacy for the refill.) (Agent: If yes, when and what did the pharmacy advise?)  Preferred Pharmacy (with phone number or street name): [facility] ([facility] - [place], [place]  Phone:  [phone] Fax:  [phone]     Agent: Please be advised that RX refills may take up to 3 business days. We ask that you follow-up with your pharmacy.

## 2020-09-22 NOTE — Chronic Care Management (AMB) (Signed)
  Chronic Care Management   Note  09/22/2020 Name: KALEM ROCKWELL MRN: [id number] DOB: [birth year]  AMIEL MCCAFFREY is a 70 y.o. year old male who is a primary care patient of [facility] reached out to [name] by phone today in response to a referral sent by Mr. Lottie Mussel Torrens's PCP, Kathrine Haddock      Mr. Salls was given information about Chronic Care Management services today including:  CCM service includes personalized support from designated clinical staff supervised by his physician, including individualized plan of care and coordination with other care providers 24/7 contact phone numbers for assistance for urgent and routine care needs. Service will only be billed when office clinical staff spend 20 minutes or more in a month to coordinate care. Only one practitioner may furnish and bill the service in a calendar month. The patient may stop CCM services at any time (effective at the end of the month) by phone call to the office staff. The patient will be responsible for cost sharing (co-pay) of up to 20% of the service fee (after annual deductible is met).  Patient agreed to services and verbal consent obtained.   Follow up plan: Telephone appointment with care management team member scheduled for: 10/14/2020  Julian Hy, [place] Management  Direct Dial: [phone]

## 2020-10-07 ENCOUNTER — Telehealth: Payer: Self-pay

## 2020-10-07 DIAGNOSIS — E119 Type 2 diabetes mellitus without complications: Secondary | ICD-10-CM

## 2020-10-07 MED ORDER — DAPAGLIFLOZIN PROPANEDIOL 5 MG PO TABS
5.0000 mg | ORAL_TABLET | Freq: Every day | ORAL | 0 refills | Status: DC
Start: 2020-10-07 — End: 2021-02-10

## 2020-10-07 NOTE — Progress Notes (Signed)
Chronic Care Management Pharmacy Assistant   Name: RIP HAWES  MRN: [id number] DOB: February 09, 1951  Reason for Encounter:Diabetes Disease State Call/ Patient assistance for Farxiga.   Recent office visits:  09/21/2020 Kathrine Haddock NP (PCP Office) Decrease Norco to #68,Would like to start SGLT2 but unnable to afford.  Will refer to pharmacy,Ambulatory referral to Neurology 08/24/2020 Kathrine Haddock NP (PCP Office) Increase Gabapentin from 300 mg TID to 600 mg TID.Agree to taper 10% per month for  HYDROcodone-acetaminophen (NORCO) 10-325 MG tablet #75 dose this month and taper to #68 next month  Recent consult visits:  08/25/2020 Dr. Nehemiah Massed MD (Dermatology) 08/24/2020 Dr.Singh MD (Nephrology) Only take spironolactone if systolic number is consistently over 140's and to take MWF only  Hospital visits:  None in previous 6 months  Medications: Outpatient Encounter Medications as of 10/07/2020  Medication Sig   aspirin EC 325 MG EC tablet Take 1 tablet (325 mg total) by mouth 2 (two) times daily. Then take one 81 mg aspirin once a day for three weeks. Then discontinue aspirin.   atorvastatin (LIPITOR) 40 MG tablet Take 1 tablet (40 mg total) by mouth at bedtime.   blood glucose meter kit and supplies Dispense based on patient and insurance preference. Use up to four times daily as directed. (FOR ICD-10 E10.9, E11.9).   Dulaglutide (TRULICITY) 1.5 WI/2.0BT SOPN Inject 1.5 mg into the skin once a week.   DULoxetine (CYMBALTA) 60 MG capsule Take 1 capsule (60 mg total) by mouth daily.   fluticasone (FLONASE) 50 MCG/ACT nasal spray Place 2 sprays into both nostrils daily. 2 sprays (Patient not taking: No sig reported)   gabapentin (NEURONTIN) 600 MG tablet Take 1 tablet (600 mg total) by mouth 3 (three) times daily.   glipiZIDE (GLUCOTROL) 10 MG tablet Take 1 tablet (10 mg total) by mouth daily before breakfast.   glucose blood (ACCU-CHEK AVIVA PLUS) test strip Use as instructed (Patient  not taking: No sig reported)   glucose blood (TRUE METRIX BLOOD GLUCOSE TEST) test strip Use as instructed (Patient not taking: No sig reported)   HYDROcodone-acetaminophen (NORCO) 10-325 MG tablet Every other day 3 tabs one day, 2.5 tabs the next day   hydrOXYzine (ATARAX/VISTARIL) 25 MG tablet Take 25 mg by mouth 3 (three) times daily as needed for anxiety.    ketoconazole (NIZORAL) 2 % cream Apply to the feet QHS.   metFORMIN (GLUCOPHAGE) 1000 MG tablet Take 1 tablet (1,000 mg total) by mouth 2 (two) times daily with a meal.   metoprolol succinate (TOPROL-XL) 100 MG 24 hr tablet TAKE 1 TABLET (100 MG TOTAL) BY MOUTH DAILY. TAKE WITH OR IMMEDIATELY FOLLOWING A MEAL.   Multiple Vitamin (MULTIVITAMIN) tablet Take 1 tablet by mouth daily.   spironolactone (ALDACTONE) 25 MG tablet Take 25 mg by mouth daily.   tiZANidine (ZANAFLEX) 4 MG tablet Take 4 mg by mouth every 6 (six) hours as needed for muscle spasms.   TRUEplus Lancets 33G MISC  (Patient not taking: Reported on 09/21/2020)   valsartan-hydrochlorothiazide (DIOVAN-HCT) 320-12.5 MG tablet TAKE 1 TABLET EVERY DAY   No facility-administered encounter medications on file as of 10/07/2020.    Care Gaps: None ID Star Rating Drugs: Atorvastatin 40 mg last filled on 04/22/2020 for 90 day supply at [facility]. Trulicity 5.9RC last filled on 10/03/2018 for 84 day supply at [facility]. Glipizide 10 mg last filled on 04/21/2020 for 90 day supply at [facility]. Metformin 1000 mg last filled on 04/23/2020 for 90  day supply at [facility]. Valsartan-HCTZ 320-12.5 mg last filled on 04/26/2020 for 90 day supply at [facility].  Recent Relevant Labs: Lab Results  Component Value Date/Time   HGBA1C 7.6 (A) 09/21/2020 10:04 AM   HGBA1C 7.4 (H) 06/15/2020 09:40 AM   HGBA1C 6.6 (A) 06/14/2020 11:26 AM   HGBA1C 7.6 (H) 03/16/2020 12:00 AM   HGBA1C 8.9 03/25/2018 10:00 AM   HGBA1C 8.9 (A) 03/25/2018 10:00 AM   HGBA1C 8.9 (A)  03/25/2018 10:00 AM   HGBA1C 8.0 (A) 12/18/2017 08:57 AM   MICROALBUR 0.3 03/16/2020 12:00 AM   MICROALBUR 10.9 04/07/2019 10:58 AM    Kidney Function Lab Results  Component Value Date/Time   CREATININE 1.39 (H) 09/21/2020 11:10 AM   CREATININE 1.24 06/17/2020 02:55 AM   CREATININE 1.67 (H) 06/15/2020 09:40 AM   CREATININE 1.35 (H) 06/14/2020 11:29 AM   GFRNONAA >60 06/17/2020 02:55 AM   GFRNONAA 44 (L) 03/16/2020 12:00 AM   GFRAA 51 (L) 03/16/2020 12:00 AM    Current antihyperglycemic regimen:  Trulicity 1.5 mg once weekly. Glipizide 10 mg 1 tablet daily Metformin 1000 mg 1 tablet 2 times daily. What recent interventions/DTPs have been made to improve glycemic control:  None ID Have there been any recent hospitalizations or ED visits since last visit with CPP? No Patient denies hypoglycemic symptoms, including Pale, Sweaty, Shaky, Hungry, Nervous/irritable, and Vision changes Patient denies hyperglycemic symptoms, including blurry vision, excessive thirst, fatigue, polyuria, and weakness How often are you checking your blood sugar? once daily What are your blood sugars ranging?  Before meals:  Patient states his blood sugars ranges from 140-150. Patient states on 10/07/2020 it was 171.Patient states he had chicken noodle soup on 10/06/2020 for dinner, and his blood sugar always seems to be elevated  after eating noodles.   During the week, how often does your blood glucose drop below 70? Never Are you checking your feet daily/regularly?   Patient denies numbness,pain or tingling in the bottom of his feet.  Adherence Review: Is the patient currently on a STATIN medication? Yes Is the patient currently on ACE/ARB medication? Yes Does the patient have >5 day gap between last estimated fill dates? Yes  Per Clinical Pharmacist, If patient is still interested we can start him with PAP for Farxiga and get him a voucher for the first month of medication.I will send in a prescription  for Wilder Glade to his pharmacy. He can drop by the front desk to pick up the voucher which will get him the first 30 days free while we work on the PAP paperwork. If he would like I can have the PAP paperwork ready for him Patient states he will give it a try as long as it can afford it.Patient states it is okay to cancel appointment in July and keep his follow up in [place] to scheduler.   Spoke to patient to inform him that we will have his  Patient assistance form  for Farxiga at the front desk of his PCP office..Informed patient to include a copy of his proof of income AND a copy of his Explanation of Benefits (EOB) statement from her insurance.  Patient asking what dose would he start on, and if he has to stop any other medication he is currently taking. Per clinical pharmacist, 5 mg will be the dose and he can continue all other medications for now. Patient verbalized understanding and will pick up the voucher and finish the patient assistance application.Notified Clinical Pharmacist.  [place] Pharmacist Assistant [phone]

## 2020-10-07 NOTE — Addendum Note (Signed)
Addended by: Daron Offer A on: 10/07/2020 11:44 AM   Modules accepted: Orders

## 2020-10-13 ENCOUNTER — Ambulatory Visit: Payer: Self-pay

## 2020-10-13 ENCOUNTER — Telehealth: Payer: Self-pay

## 2020-10-13 DIAGNOSIS — E119 Type 2 diabetes mellitus without complications: Secondary | ICD-10-CM | POA: Diagnosis not present

## 2020-10-13 NOTE — Progress Notes (Signed)
    Chronic Care Management Pharmacy Assistant   Name: Evan Rivas  MRN: [id number] DOB: 05/06/1950  Reason for Encounter: Hypertension Disease State Call  After careful review of the patients chart it was noted that [name] has already done a DM assessment on this patient on 10/07/2020 so no new assessment will be done this month as it was added to my work as a mistake per [name], PTM.   I will call patient next month to complete his HTN monthly assessment.  [name], CPA/CMA Clinical Pharmacist Assistant Phone: [phone]

## 2020-10-13 NOTE — Progress Notes (Signed)
[place] Patient [place] form for Wilder Glade completed by patient and faxed for review on 10/13/20

## 2020-10-14 ENCOUNTER — Telehealth: Payer: Medicare PPO

## 2020-10-19 ENCOUNTER — Ambulatory Visit: Payer: Medicare PPO | Admitting: Unknown Physician Specialty

## 2020-10-19 ENCOUNTER — Other Ambulatory Visit: Payer: Self-pay

## 2020-10-19 ENCOUNTER — Encounter: Payer: Self-pay | Admitting: Unknown Physician Specialty

## 2020-10-19 VITALS — BP 130/76 | HR 92 | Temp 97.8°F | Resp 18 | Ht 75.0 in | Wt 268.7 lb

## 2020-10-19 DIAGNOSIS — Z79899 Other long term (current) drug therapy: Secondary | ICD-10-CM

## 2020-10-19 DIAGNOSIS — G8929 Other chronic pain: Secondary | ICD-10-CM

## 2020-10-19 DIAGNOSIS — F1120 Opioid dependence, uncomplicated: Secondary | ICD-10-CM

## 2020-10-19 DIAGNOSIS — R531 Weakness: Secondary | ICD-10-CM | POA: Diagnosis not present

## 2020-10-19 MED ORDER — HYDROCODONE-ACETAMINOPHEN 10-325 MG PO TABS: ORAL_TABLET | ORAL | 0 refills | Status: DC

## 2020-10-19 NOTE — Assessment & Plan Note (Signed)
Per previous tapering agreement.  Will decrease Hydrocodone to #62.  PMP aware data base reviewed.

## 2020-10-19 NOTE — Progress Notes (Signed)
BP 130/76   Pulse 92   Temp 97.8 F (36.6 C) (Oral)   Resp 18   Ht 6\' 3"  (1.905 m)   Wt 268 lb 11.2 oz (121.9 kg)   SpO2 96%   BMI 33.59 kg/m    Subjective:    Patient ID: Evan Rivas, male    DOB: 02/16/1951, 70 y.o.   MRN: [id number]  HPI: Evan Rivas is a 70 y.o. male  Chief Complaint  Patient presents with   Follow-up    4 week   Pt is here to f/u of opioid tapering.  Last month was prescribed #68.  We are decreasing approx 7-10% per month.  Had an episisode with back after driving to [place] without stops.  Took Tizanidine and heating pad.    He is somewhat concerned due to unable to tolerate long bouts of golf.  However, he was out for 2 hours without fluids with him.    Taking Farxiga arranged through pharmacy.  Tolerating well.    Relevant past medical, surgical, family and social history reviewed and updated as indicated. Interim medical history since our last visit reviewed. Allergies and medications reviewed and updated.  Review of Systems  Per HPI unless specifically indicated above     Objective:    BP 130/76   Pulse 92   Temp 97.8 F (36.6 C) (Oral)   Resp 18   Ht 6\' 3"  (1.905 m)   Wt 268 lb 11.2 oz (121.9 kg)   SpO2 96%   BMI 33.59 kg/m   Wt Readings from Last 3 Encounters:  10/19/20 268 lb 11.2 oz (121.9 kg)  09/21/20 272 lb 8 oz (123.6 kg)  08/24/20 272 lb 4.8 oz (123.5 kg)    Physical Exam Constitutional:      General: He is not in acute distress.    Appearance: Normal appearance. He is well-developed.  HENT:     Head: Normocephalic and atraumatic.  Eyes:     General: Lids are normal. No scleral icterus.       Right eye: No discharge.        Left eye: No discharge.     Conjunctiva/sclera: Conjunctivae normal.  Neck:     Vascular: No carotid bruit or JVD.  Cardiovascular:     Rate and Rhythm: Normal rate and regular rhythm.     Heart sounds: Normal heart sounds.  Pulmonary:     Effort: Pulmonary effort is normal. No  respiratory distress.     Breath sounds: Normal breath sounds.  Abdominal:     Palpations: There is no hepatomegaly or splenomegaly.  Musculoskeletal:        General: Normal range of motion.     Cervical back: Normal range of motion and neck supple.  Skin:    General: Skin is warm and dry.     Coloration: Skin is not pale.     Findings: No rash.  Neurological:     Mental Status: He is alert and oriented to person, place, and time.  Psychiatric:        Behavior: Behavior normal.        Thought Content: Thought content normal.        Judgment: Judgment normal.    Results for orders placed or performed in visit on 09/21/20  Comprehensive metabolic panel  Result Value Ref Range   Glucose, Bld 198 (H) 65 - 99 mg/dL   BUN 28 (H) 7 - 25 mg/dL   Creat 1.39 (H)  0.70 - 1.25 mg/dL   BUN/Creatinine Ratio 20 6 - 22 (calc)   Sodium 140 135 - 146 mmol/L   Potassium 5.3 3.5 - 5.3 mmol/L   Chloride 104 98 - 110 mmol/L   CO2 28 20 - 32 mmol/L   Calcium 9.3 8.6 - 10.3 mg/dL   Total Protein 6.6 6.1 - 8.1 g/dL   Albumin 4.0 3.6 - 5.1 g/dL   Globulin 2.6 1.9 - 3.7 g/dL (calc)   AG Ratio 1.5 1.0 - 2.5 (calc)   Total Bilirubin 0.3 0.2 - 1.2 mg/dL   Alkaline phosphatase (APISO) 84 35 - 144 U/L   AST 17 10 - 35 U/L   ALT 30 9 - 46 U/L  Lipid panel  Result Value Ref Range   Cholesterol 154 <200 mg/dL   HDL 35 (L) > OR = 40 mg/dL   Triglycerides 237 (H) <150 mg/dL   LDL Cholesterol (Calc) 86 mg/dL (calc)   Total CHOL/HDL Ratio 4.4 <5.0 (calc)   Non-HDL Cholesterol (Calc) 119 <130 mg/dL (calc)  POCT HgB A1C  Result Value Ref Range   Hemoglobin A1C 7.6 (A) 4.0 - 5.6 %   HbA1c POC (<> result, manual entry)     HbA1c, POC (prediabetic range)     HbA1c, POC (controlled diabetic range)        Assessment & Plan:   Problem List Items Addressed This Visit       Unprioritized   Continuous opioid dependence ([place])    Per previous tapering agreement.  Will decrease Hydrocodone to #62.  PMP aware  data base reviewed.         Other Visit Diagnoses     Weakness    -  Primary   Suspect related to heat and activity.  Pt will need to always have water and set goal of 2 days/week with 30 minutes and increase 30 minutes per week.           Follow up plan: Return in about 4 weeks (around 11/16/2020).

## 2020-10-20 ENCOUNTER — Other Ambulatory Visit: Payer: Self-pay | Admitting: Family Medicine

## 2020-10-20 DIAGNOSIS — I10 Essential (primary) hypertension: Secondary | ICD-10-CM

## 2020-10-20 NOTE — Telephone Encounter (Signed)
Requested Prescriptions  Pending Prescriptions Disp Refills  . valsartan-hydrochlorothiazide (DIOVAN-HCT) 320-12.5 MG tablet [Pharmacy Med Name: VALSARTAN/HYDROCHLOROTHIAZIDE 320-12.5 MG Tablet] 90 tablet 0    Sig: TAKE 1 TABLET EVERY DAY     Cardiovascular: ARB + Diuretic Combos Failed - 10/20/2020  4:27 PM      Failed - Cr in normal range and within 180 days    Creat  Date Value Ref Range Status  09/21/2020 1.39 (H) 0.70 - 1.25 mg/dL Final    Comment:    For patients >77 years of age, the reference limit for Creatinine is approximately 13% higher for people identified as African-American. .    Creatinine, Urine  Date Value Ref Range Status  03/16/2020 113 20 - 320 mg/dL Final         Passed - K in normal range and within 180 days    Potassium  Date Value Ref Range Status  09/21/2020 5.3 3.5 - 5.3 mmol/L Final         Passed - Na in normal range and within 180 days    Sodium  Date Value Ref Range Status  09/21/2020 140 135 - 146 mmol/L Final  03/19/2019 142 134 - 144 mmol/L Final         Passed - Ca in normal range and within 180 days    Calcium  Date Value Ref Range Status  09/21/2020 9.3 8.6 - 10.3 mg/dL Final         Passed - Patient is not pregnant      Passed - Last BP in normal range    BP Readings from Last 1 Encounters:  10/19/20 130/76         Passed - Valid encounter within last 6 months    Recent Outpatient Visits          Yesterday Weakness   [facility] Kathrine Haddock, NP   4 weeks ago Type 2 diabetes mellitus without complication, without long-term current use of insulin [facility])   [facility] Kathrine Haddock, NP   1 month ago Continuous opioid dependence [facility])   [facility] [place], Malachy Mood, NP   3 months ago Continuous opioid dependence [facility])   [facility] Just, Laurita Quint, FNP   4 months ago Type 2 diabetes mellitus without complication, without long-term current use of  insulin [facility])   [facility] Myles Gip, DO      Future Appointments            In 3 weeks Delsa Grana, PA-C [facility], [place]   In 2 months [facility], [place]   In 3 months  [facility], [facility]

## 2020-10-27 LAB — COLOGUARD: Cologuard: NEGATIVE

## 2020-10-31 LAB — COLOGUARD: COLOGUARD: NEGATIVE

## 2020-11-10 ENCOUNTER — Telehealth: Payer: Self-pay

## 2020-11-10 NOTE — Progress Notes (Signed)
    Chronic Care Management Pharmacy Assistant   Name: Evan Rivas  MRN: [id number] DOB: 02-26-1951  08/25 I have tried 3 times with no success to reach this patient in order to get him rescheduled with [name], CPP. I have left messages requesting a call back  08/18 LVM requesting patient to return my call in order to get him rescheduled with [name], CPP  08/10 Patient had an appointment with [name], CPP on 11/11/2020 but has cancelled the appointment as he has a PCP appointment on this date.  I reached out to the patient to get him rescheduled with Cristie Hem, but I had to leave a VM at this time requesting patient to return my call so I can get his appointment rescheduled with Cristie Hem.

## 2020-11-11 ENCOUNTER — Encounter: Payer: Self-pay | Admitting: Family Medicine

## 2020-11-11 ENCOUNTER — Other Ambulatory Visit: Payer: Self-pay

## 2020-11-11 ENCOUNTER — Telehealth: Payer: Self-pay

## 2020-11-11 ENCOUNTER — Ambulatory Visit: Payer: Medicare PPO | Admitting: Family Medicine

## 2020-11-11 VITALS — BP 134/82 | HR 92 | Temp 98.2°F | Resp 16 | Ht 75.0 in | Wt 264.9 lb

## 2020-11-11 DIAGNOSIS — N400 Enlarged prostate without lower urinary tract symptoms: Secondary | ICD-10-CM

## 2020-11-11 DIAGNOSIS — G8929 Other chronic pain: Secondary | ICD-10-CM

## 2020-11-11 DIAGNOSIS — R531 Weakness: Secondary | ICD-10-CM

## 2020-11-11 DIAGNOSIS — Z79899 Other long term (current) drug therapy: Secondary | ICD-10-CM

## 2020-11-11 DIAGNOSIS — I10 Essential (primary) hypertension: Secondary | ICD-10-CM | POA: Diagnosis not present

## 2020-11-11 DIAGNOSIS — E119 Type 2 diabetes mellitus without complications: Secondary | ICD-10-CM

## 2020-11-11 DIAGNOSIS — I700 Atherosclerosis of aorta: Secondary | ICD-10-CM | POA: Diagnosis not present

## 2020-11-11 DIAGNOSIS — F331 Major depressive disorder, recurrent, moderate: Secondary | ICD-10-CM

## 2020-11-11 DIAGNOSIS — F419 Anxiety disorder, unspecified: Secondary | ICD-10-CM

## 2020-11-11 DIAGNOSIS — F1120 Opioid dependence, uncomplicated: Secondary | ICD-10-CM

## 2020-11-11 DIAGNOSIS — E7800 Pure hypercholesterolemia, unspecified: Secondary | ICD-10-CM

## 2020-11-11 DIAGNOSIS — E669 Obesity, unspecified: Secondary | ICD-10-CM

## 2020-11-11 DIAGNOSIS — E1169 Type 2 diabetes mellitus with other specified complication: Secondary | ICD-10-CM

## 2020-11-11 MED ORDER — DULOXETINE HCL 60 MG PO CPEP: 60.0000 mg | ORAL_CAPSULE | Freq: Every day | ORAL | 3 refills | Status: DC

## 2020-11-11 MED ORDER — METFORMIN HCL 1000 MG PO TABS: 1000.0000 mg | ORAL_TABLET | Freq: Two times a day (BID) | ORAL | 1 refills | Status: DC

## 2020-11-11 MED ORDER — VALSARTAN-HYDROCHLOROTHIAZIDE 320-12.5 MG PO TABS: 1.0000 | ORAL_TABLET | Freq: Every day | ORAL | 3 refills | Status: DC

## 2020-11-11 MED ORDER — METOPROLOL SUCCINATE ER 100 MG PO TB24: 100.0000 mg | ORAL_TABLET | Freq: Every day | ORAL | 3 refills | Status: DC

## 2020-11-11 MED ORDER — HYDROCODONE-ACETAMINOPHEN 10-325 MG PO TABS: ORAL_TABLET | ORAL | 0 refills | Status: DC

## 2020-11-11 NOTE — Progress Notes (Signed)
Name: Evan Rivas   MRN: [id number]    DOB: 1951-01-15   Date:11/11/2020       Progress Note  Chief Complaint  Patient presents with   Hyperlipidemia   Hypertension   Diabetes   Depression   Pain    Weaning down to stop     Subjective:   Evan Rivas is a 70 y.o. male, presents to clinic for 4 week f/up - last saw Kathrine Haddock NP I have not seen before, previously he saw Dr. Verda Cumins in Jan 2022, Dr. Ky Barban in March 2022, Merleen Nicely Just NP April 2022    Last OV, and severe prior OV for narcotic management/refills for chronic pain  Pain Pt is s/p bilateral knee replacements and rt hip replacement.  Pt has been on chronic opioids for 20 years.  He states he mostly takes it for his back pain.  He is compliant with monitoring.  Taking Gabapentin 300 mg TID from pain management. He would like to try to taper as opposed to going to pain management.  Last visit Norco got tapered from #90-#75.  States the pain meds allow him to do things such as go to the store  [place] further decreased pain meds by sending in hydrocodone 10-325 in May #75, June #68, then July #62  Reviewed PDMP -  Multiple prescribers over the past two years and even in the past 6 months additional prescribers besides from this primary care office -  [name], [name], Dr. Roxan Hockey, Dr, Parthenia Ames Just, Kathrine Haddock and additionally Fenton Foy- who prescribed Oxycodone twice in March while pt was still filling #90 of hydrocodone   Cardiology/Nephrology  Dr. [name] and Dr. Candiss Norse  On and off spironolactone between the specialist  Currently taking it 2x a week and it manages his LE edema In June 3 x a week potassium was 5.3     Current Outpatient Medications:    aspirin EC 325 MG EC tablet, Take 1 tablet (325 mg total) by mouth 2 (two) times daily. Then take one 81 mg aspirin once a day for three weeks. Then discontinue aspirin. (Patient taking differently: Take 81 mg by mouth daily. Then  take one 81 mg aspirin once a day for three weeks. Then discontinue aspirin.), Disp: 42 tablet, Rfl: 0   atorvastatin (LIPITOR) 40 MG tablet, Take 1 tablet (40 mg total) by mouth at bedtime., Disp: 90 tablet, Rfl: 3   blood glucose meter kit and supplies, Dispense based on patient and insurance preference. Use up to four times daily as directed. (FOR ICD-10 E10.9, E11.9)., Disp: 1 each, Rfl: 0   dapagliflozin propanediol (FARXIGA) 5 MG TABS tablet, Take 1 tablet (5 mg total) by mouth daily before breakfast., Disp: 30 tablet, Rfl: 0   Dulaglutide (TRULICITY) 1.5 RA/0.7MA SOPN, Inject 1.5 mg into the skin once a week., Disp: 6 mL, Rfl: 3   DULoxetine (CYMBALTA) 60 MG capsule, Take 1 capsule (60 mg total) by mouth daily., Disp: 90 capsule, Rfl: 0   gabapentin (NEURONTIN) 600 MG tablet, Take 1 tablet (600 mg total) by mouth 3 (three) times daily., Disp: 270 tablet, Rfl: 1   glipiZIDE (GLUCOTROL) 10 MG tablet, Take 1 tablet (10 mg total) by mouth daily before breakfast., Disp: 90 tablet, Rfl: 1   glucose blood (ACCU-CHEK AVIVA PLUS) test strip, Use as instructed, Disp: 100 each, Rfl: 12   glucose blood (TRUE METRIX BLOOD GLUCOSE TEST) test strip, Use as instructed, Disp: 100 each, Rfl: 12  HYDROcodone-acetaminophen (NORCO) 10-325 MG tablet, Every other day 3 tabs one day, 2.5 tabs the next day, Disp: 62 tablet, Rfl: 0   hydrOXYzine (ATARAX/VISTARIL) 25 MG tablet, Take 25 mg by mouth 3 (three) times daily as needed for anxiety. , Disp: , Rfl:    ketoconazole (NIZORAL) 2 % cream, Apply to the feet QHS., Disp: 60 g, Rfl: 6   metFORMIN (GLUCOPHAGE) 1000 MG tablet, Take 1 tablet (1,000 mg total) by mouth 2 (two) times daily with a meal., Disp: 180 tablet, Rfl: 1   metoprolol succinate (TOPROL-XL) 100 MG 24 hr tablet, TAKE 1 TABLET (100 MG TOTAL) BY MOUTH DAILY. TAKE WITH OR IMMEDIATELY FOLLOWING A MEAL., Disp: 90 tablet, Rfl: 0   Multiple Vitamin (MULTIVITAMIN) tablet, Take 1 tablet by mouth daily., Disp: ,  Rfl:    spironolactone (ALDACTONE) 25 MG tablet, Take 25 mg by mouth 2 (two) times a week., Disp: , Rfl:    tiZANidine (ZANAFLEX) 4 MG tablet, Take 4 mg by mouth every 6 (six) hours as needed for muscle spasms., Disp: , Rfl:    TRUEplus Lancets 33G MISC, , Disp: , Rfl:    valsartan-hydrochlorothiazide (DIOVAN-HCT) 320-12.5 MG tablet, TAKE 1 TABLET EVERY DAY, Disp: 90 tablet, Rfl: 0  Patient Active Problem List   Diagnosis Date Noted   Osteoarthritis of right hip 01/07/2020   Hip pain, right 12/31/2019   Trochanteric bursitis of right hip 11/27/2019   History of total knee replacement, bilateral 11/24/2019   Nerve entrapment syndrome of lower extremity, right 10/24/2019   Spine pain, multilevel 10/24/2019   Controlled substance agreement signed 09/30/2019   Emphysematous pyelitis 09/04/2019   CKD stage G3b/A2, GFR 30-44 and albumin creatinine ratio 30-299 mg/g ([place]) 04/09/2019   Hyperaldosteronism ([place]) 79/48/0165   Diastolic dysfunction 53/74/8270   OSA (obstructive sleep apnea) 06/24/2018   Excessive daytime sleepiness 06/24/2018   Aortic atherosclerosis ([place]) 06/17/2018   Leg edema 12/18/2017   BPH without urinary obstruction 06/03/2015   Depression 03/05/2015   Hyperlipidemia 09/03/2014   Major depressive disorder, recurrent episode, moderate ([place]) 09/03/2014   Chronic low back pain 09/03/2014   Allergic rhinitis 07/04/2014   Anxiety 07/04/2014   Continuous opioid dependence ([place]) 07/04/2014   Diabetes mellitus type 2 in obese ([place]) 07/04/2014   DDD (degenerative disc disease), lumbar 07/04/2014   Gout 07/04/2014   Essential hypertension 07/04/2014   Morbid obesity ([place]) 07/04/2014   Neuralgia neuritis, sciatic nerve 07/04/2014   H/O malignant neoplasm of skin 12/24/2013    Past Surgical History:  Procedure Laterality Date   ARTHROSCOPIC REPAIR ACL Left    CYSTOSCOPY W/ URETERAL STENT PLACEMENT Right 09/07/2014   Procedure: CYSTOSCOPY WITH STENT REPLACEMENT;  Surgeon:  Hollice Espy, MD;  Location: [facility];  Service: Urology;  Laterality: Right;   CYSTOSCOPY WITH INSERTION OF UROLIFT N/A 11/24/2019   Procedure: CYSTOSCOPY WITH INSERTION OF UROLIFT;  Surgeon: Hollice Espy, MD;  Location: [facility];  Service: Urology;  Laterality: N/A;   CYSTOSCOPY/URETEROSCOPY/HOLMIUM LASER/STENT PLACEMENT Right 08/17/2014   Procedure: CYSTOSCOPY/URETEROSCOPY//STENT PLACEMENT/ attempt of lithotripsy;  Surgeon: Hollice Espy, MD;  Location: [facility];  Service: Urology;  Laterality: Right;   HERNIA REPAIR     JOINT REPLACEMENT     MOHS SURGERY     lip   TOTAL HIP ARTHROPLASTY Right 06/16/2020   Procedure: TOTAL HIP ARTHROPLASTY ANTERIOR APPROACH;  Surgeon: Gaynelle Arabian, MD;  Location: [facility];  Service: Orthopedics;  Laterality: Right;  121mn   TOTAL KNEE ARTHROPLASTY Left 04/24/2016   Procedure:  LEFT TOTAL KNEE ARTHROPLASTY;  Surgeon: Gaynelle Arabian, MD;  Location: [facility];  Service: Orthopedics;  Laterality: Left;  Adductor Block   TOTAL KNEE ARTHROPLASTY Right 10/23/2016   Procedure: RIGHT TOTAL KNEE ARTHROPLASTY;  Surgeon: Gaynelle Arabian, MD;  Location: [facility];  Service: Orthopedics;  Laterality: Right;   URETEROSCOPY WITH HOLMIUM LASER LITHOTRIPSY Right 09/07/2014   Procedure: URETEROSCOPY WITH HOLMIUM LASER LITHOTRIPSY;  Surgeon: Hollice Espy, MD;  Location: [facility];  Service: Urology;  Laterality: Right;   VASECTOMY      Family History  Problem Relation Age of Onset   Hypertension Mother    Cancer Mother    Hyperlipidemia Mother    Arthritis Mother    Stroke Father    Heart disease Father    Obesity Father    Depression Sister    Hypertension Sister    Hyperlipidemia Sister    Learning disabilities Sister    Obesity Sister    Kidney disease Maternal Grandmother     Social History   Tobacco Use   Smoking status: Never   Smokeless tobacco: Never  Vaping Use   Vaping Use: Never used  Substance Use Topics   Alcohol use: No   Drug use: No      Allergies  Allergen Reactions   Celebrex [Celecoxib]     GI issues   Tetanus Toxoids Other (See Comments)    unkown   Tetanus-Diphtheria Toxoids Td     Unknown    Health Maintenance  Topic Date Due   COVID-19 Vaccine (4 - Booster for Pfizer series) 11/27/2020 (Originally 06/01/2020)   Zoster Vaccines- Shingrix (1 of 2) 12/22/2020 (Originally [birth year]16/1969)   INFLUENZA VACCINE  12/28/2020 (Originally 11/01/2020)   OPHTHALMOLOGY EXAM  12/31/2020 (Originally 05/31/2020)   Hepatitis C Screening  01/17/2024 (Originally 11/16/1968)   HEMOGLOBIN A1C  03/23/2021   FOOT EXAM  06/14/2021   COLONOSCOPY (Pts 45-73yr Insurance coverage will need to be confirmed)  10/26/2030   PNA vac Low Risk Adult  Completed   HPV VACCINES  Aged Out   TETANUS/TDAP  Discontinued    Chart Review Today: I personally reviewed active problem list, medication list, allergies, family history, social history, health maintenance, notes from last encounter, lab results, imaging with the patient/caregiver today.   Review of Systems  Constitutional: Negative.   HENT: Negative.    Eyes: Negative.   Respiratory: Negative.    Cardiovascular: Negative.   Gastrointestinal: Negative.   Endocrine: Negative.   Genitourinary: Negative.   Musculoskeletal: Negative.   Skin: Negative.   Allergic/Immunologic: Negative.   Neurological: Negative.   Hematological: Negative.   Psychiatric/Behavioral: Negative.    All other systems reviewed and are negative.   Objective:   Vitals:   11/11/20 1449  BP: 140/82  Pulse: 92  Resp: 16  Temp: 98.2 F (36.8 C)  SpO2: 97%  Weight: 264 lb 14.4 oz (120.2 kg)  Height: '6\' 3"'  (1.905 m)    Body mass index is 33.11 kg/m.  Physical Exam Vitals and nursing note reviewed.  Constitutional:      General: He is not in acute distress.    Appearance: Normal appearance. He is well-developed. He is obese. He is not ill-appearing, toxic-appearing or diaphoretic.  HENT:     Head:  Normocephalic and atraumatic.     Nose: Nose normal.  Eyes:     General:        Right eye: No discharge.        Left eye: No discharge.  Conjunctiva/sclera: Conjunctivae normal.  Neck:     Trachea: No tracheal deviation.  Cardiovascular:     Rate and Rhythm: Normal rate and regular rhythm.     Pulses: Normal pulses.     Heart sounds: Normal heart sounds.  Pulmonary:     Effort: Pulmonary effort is normal. No respiratory distress.     Breath sounds: Normal breath sounds. No stridor.  Musculoskeletal:        General: Normal range of motion.  Skin:    General: Skin is warm and dry.     Findings: No rash.  Neurological:     Mental Status: He is alert.     Motor: No abnormal muscle tone.     Coordination: Coordination normal.  Psychiatric:        Behavior: Behavior normal.        Assessment & Plan:     ICD-10-CM   1. Major depressive disorder, recurrent episode, moderate (HCC)  F33.1 DULoxetine (CYMBALTA) 60 MG capsule    2. Pure hypercholesterolemia  E78.00    labs recently done, reviewed, continue statin    3. Essential hypertension  I10 metoprolol succinate (TOPROL-XL) 100 MG 24 hr tablet    valsartan-hydrochlorothiazide (DIOVAN-HCT) 320-12.5 MG tablet   stable, well controlled, BP at goal, recent labs, renal function, potassium reviewed, meds refilled    4. Aortic atherosclerosis (HCC)  I70.0    on statin, monitoring    5. Diabetes mellitus type 2 in obese (HCC)  E11.69 metFORMIN (GLUCOPHAGE) 1000 MG tablet   E66.9    uncontrolled, recent A1C 7.6, work on diet, continue same meds    6. BPH without urinary obstruction  N40.0     7. Morbid obesity ([place])  E66.01     8. Controlled substance agreement signed  Z79.899 Drugs of abuse screen w/o alc, rtn urine-sln    DRUG MONITOR, PANEL 1, W/CONF, URINE    HYDROcodone-acetaminophen (NORCO) 10-325 MG tablet    DRUG MONITOR, PANEL 1, W/CONF, URINE    DM TEMPLATE   reviewed contract, guidelines, pt signed will be  uploaded to chart - planning on gradually weaning off pain meds 7% each month, doing well    9. Other chronic pain  G89.29 Drugs of abuse screen w/o alc, rtn urine-sln    DRUG MONITOR, PANEL 1, W/CONF, URINE    HYDROcodone-acetaminophen (NORCO) 10-325 MG tablet    DRUG MONITOR, PANEL 1, W/CONF, URINE    DM TEMPLATE    10. Continuous opioid dependence (HCC)  F11.20 Drugs of abuse screen w/o alc, rtn urine-sln    DRUG MONITOR, PANEL 1, W/CONF, URINE    HYDROcodone-acetaminophen (NORCO) 10-325 MG tablet    DRUG MONITOR, PANEL 1, W/CONF, URINE    DM TEMPLATE   see above, pt motivated to wean off    11. Anxiety  F41.9 DULoxetine (CYMBALTA) 60 MG capsule       Return in about 3 months (around 02/11/2021) for pain med HTN DM f/up in office .   Delsa Grana, PA-C 11/11/20 3:42 PM

## 2020-11-11 NOTE — Patient Instructions (Signed)
Controlled substance rule and prescribing guidelines:   Opioid Pain Medicine Management Opioid pain medicines are strong medicines that are used to treat bad or very bad pain. When you take them for a short time, they can help you: Sleep better. Do better in physical therapy. Feel better during the first few days after you get hurt. Recover from surgery. Only take these medicines if a doctor says that you can. You should only take them for a short time. This is because opioids can be hard to stop taking (they are addictive). The longer you take opioids, the harder it may be to stop taking them (opioid use disorder). What are the risks? Opioids can cause problems (side effects). Taking them for more than 3 days raises your chance of problems, such as: Trouble pooping (constipation). Feeling sick to your stomach (nausea). Vomiting. Feeling very sleepy. Confusion. Not being able to stop taking the medicine. Breathing problems. Taking opioids for a long time can make it hard for you to do daily tasks. It can also put you at risk for: Car accidents. Depression. Suicide. Heart attack. Taking too much of the medicine (overdose), which can sometimes lead to death. What is a pain treatment plan? A pain treatment plan is a plan made by you and your doctor. Work with your doctor to make a plan for treating your pain. To help you do this: Talk about the goals of your treatment, including: How much pain you might expect to have. How you will manage the pain. Talk about the risks and benefits of taking these medicines for your condition. Remember that a good treatment plan uses more than one approach and lowers the risks of side effects. Tell your doctor about the amount of medicines you take and about any drug or alcohol use. Get your pain medicine prescriptions from only one doctor. Pain can be managed with other treatments. Work with your doctor to find other ways to help your pain, such  as: Physical therapy. Counseling. Eating healthy foods. Brain exercises. Massage. Meditation. Other pain medicines. Doing gentle exercises. Tapering your use of opioids If you have been taking opioids for more than a few weeks, you may need to slowly decrease (taper) how much you take until you stop taking them. Doing this can lower your chance of having symptoms, such as: Pain and cramping in your belly (abdomen). Feeling sick to your stomach. Sweating. Feeling very sleepy. Feeling restless. Shaking you cannot control (tremors). Cravings for the medicine. Do not try to stop taking them by yourself. Work with your doctor to stop. Your doctorwill help you take less until you are not taking the medicine at all. Follow these instructions at home: Safety and storage  While you are taking opioids: Do not drive. Do not use machines or power tools. Do not sign important papers (legal documents). Do not drink alcohol. Do not take sleeping pills. Do not take care of children by yourself. Do not do activities where you need to climb or be in high places, like working on a ladder. Do not go into any water, such as a lake, river, ocean, swimming pool, or hot tub. Keep your opioids locked up or in a place where children cannot reach them. Do not share your pain medicine with anyone.  Getting rid of leftover pills Do not save any leftover pills. Get rid of leftover pills safely by: Taking them to a take-back program in your area. Bringing them to a pharmacy that has a container for  throwing away pills (pill disposal). Flushing them down the toilet. Check the label or package insert of your medicine to see whether this is safe to do. Throwing them in the trash. Check the label or package insert of your medicine to see whether this is safe to do. If it is safe to throw them out: Take the pills out of their container. Put the pills into a container you can seal. Mix the pills with used  coffee grounds, food scraps, dirt, or cat litter. Put this in the trash. Activity Return to your normal activities as told by your doctor. Ask your doctor what activities are safe for you. Avoid doing things that make your pain worse. Do exercises as told by your doctor. General instructions You may need to take these actions to prevent or treat trouble pooping: Drink enough fluid to keep your pee (urine) pale yellow. Take over-the-counter or prescription medicines. Eat foods that are high in fiber. These include beans, whole grains, and fresh fruits and vegetables. Limit foods that are high in fat and sugar. These include fried or sweet foods. Keep all follow-up visits as told by your doctor. This is important. Where to find support If you have been taking opioids for a long time, think about getting helpquitting from a local support group or counselor. Ask your doctor about this. Where to find more information Centers for Disease Control and Prevention (CDC): http://www.wolf.info/ U.S. Food and Drug Administration (FDA): [employer].ch Get help right away if: Seek medical care right away if you are taking opioids and you, or people close to you, notice any of the following: You have trouble breathing. Your breathing is slower or more shallow than normal. You have a very slow heartbeat. You feel very confused. You pass out (faint). You are very sleepy. Your speech is not normal. You feel sick to your stomach and vomit. You have cold skin. You have blue lips or fingernails. Your muscles are weak (limp) and your body seems floppy. The black centers of your eyes (pupils) are smaller than normal. If you think that you or someone else may have taken too much of an opioid medicine, get medical help right away. Call your local emergency services (911 in the [place] Do not drive yourself to the hospital. If you ever feel like you may hurt yourself or others, or have thoughts about taking your own life,  get help right away. You can go to your nearest emergency department or call: Your local emergency services (911 in the [place] The hotline of the [facility] 819 427 0443 in the [place].). A suicide crisis helpline, such as the [place] at (386)821-6069. This is open 24 hours a day. Summary Opioid are strong medicines that are used to treat bad or very bad pain. A pain treatment plan is a plan made by you and your doctor. Work with your doctor to make a plan for treating your pain. Work with your doctor to find other ways to help your pain. If you think that you or someone else may have taken too much of an opioid, get help right away. This information is not intended to replace advice given to you by your health care provider. Make sure you discuss any questions you have with your healthcare provider. Document Revised: 03/12/2020 Document Reviewed: 04/19/2018 Elsevier Patient Education  [place].

## 2020-11-14 LAB — DRUG MONITOR, PANEL 1, W/CONF, URINE
Amphetamines: NEGATIVE ng/mL (ref <500)
Barbiturates: NEGATIVE ng/mL (ref <300)
Benzodiazepines: NEGATIVE ng/mL (ref <100)
Cocaine Metabolite: NEGATIVE ng/mL (ref <150)
Codeine: NEGATIVE ng/mL (ref <50)
Creatinine: 113.5 mg/dL (ref >=20.0)
Hydrocodone: 466 ng/mL — ABNORMAL HIGH (ref <50)
Hydromorphone: 369 ng/mL — ABNORMAL HIGH (ref <50)
Marijuana Metabolite: NEGATIVE ng/mL (ref <20)
Methadone Metabolite: NEGATIVE ng/mL (ref <100)
Morphine: NEGATIVE ng/mL (ref <50)
Norhydrocodone: 1563 ng/mL — ABNORMAL HIGH (ref <50)
Opiates: POSITIVE ng/mL — AB (ref <100)
Oxidant: NEGATIVE ug/mL (ref <200)
Oxycodone: NEGATIVE ng/mL (ref <100)
Phencyclidine: NEGATIVE ng/mL (ref <25)
pH: 5.4 (ref 4.5–9.0)

## 2020-11-14 LAB — DM TEMPLATE

## 2020-11-22 MED ORDER — HYDROCODONE-ACETAMINOPHEN 10-325 MG PO TABS: 0.5000 | ORAL_TABLET | Freq: Three times a day (TID) | ORAL | 0 refills | Status: DC | PRN

## 2020-11-22 MED ORDER — HYDROCODONE-ACETAMINOPHEN 10-325 MG PO TABS: 0.5000 | ORAL_TABLET | Freq: Two times a day (BID) | ORAL | 0 refills | Status: DC | PRN

## 2020-11-29 ENCOUNTER — Other Ambulatory Visit: Payer: Self-pay

## 2020-11-29 ENCOUNTER — Other Ambulatory Visit: Payer: Self-pay | Admitting: Family Medicine

## 2020-11-29 DIAGNOSIS — E1169 Type 2 diabetes mellitus with other specified complication: Secondary | ICD-10-CM

## 2020-11-29 DIAGNOSIS — E669 Obesity, unspecified: Secondary | ICD-10-CM

## 2020-11-29 MED ORDER — SPIRONOLACTONE 25 MG PO TABS: 25.0000 mg | ORAL_TABLET | ORAL | 2 refills | Status: DC

## 2020-12-21 ENCOUNTER — Telehealth: Payer: Self-pay

## 2020-12-21 NOTE — Progress Notes (Signed)
Chronic Care Management Pharmacy Assistant   Name: Evan Rivas  MRN: [id number] DOB: 1950/05/24  Reason for Encounter: Diabetes Disease State Call/Schedule follow-up with CPP   Recent office visits:  11/11/2020 Delsa Grana, PA-C (PCP Office Visit) for HLD, DM HTN, Depression, Pain- Hydrocodone-Acetaminophen 10-325 is still in the process of being gradually decreased to taper patient off medication, Fluticasone Propionate 50 mcg discontinued due to patient no longer needs it; labs ordered; patient instructed to follow-up in 3 months  10/19/2020 Kathrine Haddock, NP (PCP Office Visit) for Follow-up- Provider in the process of tapering patient off of Hydrocodone; no medication changes noted; patient instructed to follow-up in 4 weeks  Recent consult visits:  11/24/2020 Vickki Hearing, MD (Neurology) for Initial Consult (Tremors & Sleep Apnea)- No medication changes noted; Referral sent to [facility]; no follow-up noted  Hospital visits:  None in previous 6 months  Medications: Outpatient Encounter Medications as of 12/21/2020  Medication Sig   aspirin EC 325 MG EC tablet Take 1 tablet (325 mg total) by mouth 2 (two) times daily. Then take one 81 mg aspirin once a day for three weeks. Then discontinue aspirin. (Patient taking differently: Take 81 mg by mouth daily. Then take one 81 mg aspirin once a day for three weeks. Then discontinue aspirin.)   atorvastatin (LIPITOR) 40 MG tablet Take 1 tablet (40 mg total) by mouth at bedtime.   blood glucose meter kit and supplies Dispense based on patient and insurance preference. Use up to four times daily as directed. (FOR ICD-10 E10.9, E11.9).   dapagliflozin propanediol (FARXIGA) 5 MG TABS tablet Take 1 tablet (5 mg total) by mouth daily before breakfast.   Dulaglutide (TRULICITY) 1.5 HW/3.8UE SOPN Inject 1.5 mg into the skin once a week.   DULoxetine (CYMBALTA) 60 MG capsule Take 1 capsule (60 mg total) by mouth daily.    gabapentin (NEURONTIN) 600 MG tablet Take 1 tablet (600 mg total) by mouth 3 (three) times daily.   glipiZIDE (GLUCOTROL) 10 MG tablet Take 1 tablet (10 mg total) by mouth daily before breakfast.   glucose blood (ACCU-CHEK AVIVA PLUS) test strip Use as instructed   glucose blood (TRUE METRIX BLOOD GLUCOSE TEST) test strip Use as instructed   HYDROcodone-acetaminophen (NORCO) 10-325 MG tablet Every other day 3 tabs one day, 2.5 tabs the next day   HYDROcodone-acetaminophen (NORCO) 10-325 MG tablet Take 0.5-1 tablets by mouth every 8 (eight) hours as needed for severe pain. max #54 for the month - gradually decrease dose   [START ON 01/16/2021] HYDROcodone-acetaminophen (NORCO) 10-325 MG tablet Take 0.5-1 tablets by mouth 2 (two) times daily as needed for severe pain. Max #48 for the month - gradually decrease dose   hydrOXYzine (ATARAX/VISTARIL) 25 MG tablet Take 25 mg by mouth 3 (three) times daily as needed for anxiety.    ketoconazole (NIZORAL) 2 % cream Apply to the feet QHS.   metFORMIN (GLUCOPHAGE) 1000 MG tablet TAKE 1 TABLET TWICE DAILY WITH MEALS   metoprolol succinate (TOPROL-XL) 100 MG 24 hr tablet Take 1 tablet (100 mg total) by mouth daily. Take with or immediately following a meal.   Multiple Vitamin (MULTIVITAMIN) tablet Take 1 tablet by mouth daily.   spironolactone (ALDACTONE) 25 MG tablet Take 1 tablet (25 mg total) by mouth 2 (two) times a week.   tiZANidine (ZANAFLEX) 4 MG tablet Take 4 mg by mouth every 6 (six) hours as needed for muscle spasms.   TRUEplus Lancets 33G MISC  valsartan-hydrochlorothiazide (DIOVAN-HCT) 320-12.5 MG tablet Take 1 tablet by mouth daily.   No facility-administered encounter medications on file as of 12/21/2020.   Care Gaps: COVID-19 Booster 4  Star Rating Drugs: Metformin 1000 mg last filled on 04/23/2020 for a 90-Day supply with [facility]- Spoke with [facility] and they verified the last time filled was 12/01/2020 Valsartan-HCTZ   320-12.5 mg last filled on 04/26/2020 for a 90-Day supply with [facility]- Spoke with [facility] and they verified last time filled was 10/21/2020 Farxiga 5 mg possible PAP with [facility] Atorvastatin 40 mg last filled on 04/22/2020 for a 90-Day supply with [facility]-Spoke with [facility] and they verified the last time filled was 11/27/2020 Glipizide 10 mg last filled on 04/21/2020 for a 90-Day supply with [facility]-Spoke with [facility] and they verified the last time filled was 11/27/2020 Trulicity 1.5 mg PAP with [facility]   Recent Relevant Labs: Lab Results  Component Value Date/Time   HGBA1C 7.6 (A) 09/21/2020 10:04 AM   HGBA1C 7.4 (H) 06/15/2020 09:40 AM   HGBA1C 6.6 (A) 06/14/2020 11:26 AM   HGBA1C 7.6 (H) 03/16/2020 12:00 AM   HGBA1C 8.9 03/25/2018 10:00 AM   HGBA1C 8.9 (A) 03/25/2018 10:00 AM   HGBA1C 8.9 (A) 03/25/2018 10:00 AM   HGBA1C 8.0 (A) 12/18/2017 08:57 AM   MICROALBUR 0.3 03/16/2020 12:00 AM   MICROALBUR 10.9 04/07/2019 10:58 AM    Kidney Function Lab Results  Component Value Date/Time   CREATININE 1.39 (H) 09/21/2020 11:10 AM   CREATININE 1.24 06/17/2020 02:55 AM   CREATININE 1.67 (H) 06/15/2020 09:40 AM   CREATININE 1.35 (H) 06/14/2020 11:29 AM   GFRNONAA >60 06/17/2020 02:55 AM   GFRNONAA 44 (L) 03/16/2020 12:00 AM   GFRAA 51 (L) 03/16/2020 12:00 AM    Current antihyperglycemic regimen:  Farxiga 5 mg 1 tablet daily Glipizide 10 mg 1 tablet daily Trulicity 1.5 mg inject once weekly Metformin 1000 mg 1 tablet twice daily  What recent interventions/DTPs have been made to improve glycemic control:  None ID  Have there been any recent hospitalizations or ED visits since last visit with CPP? No  Patient denies hypoglycemic symptoms, including Pale, Sweaty, Shaky, Hungry, Nervous/irritable, and Vision changes  Patient denies hyperglycemic symptoms, including blurry vision, excessive thirst, fatigue, polyuria, and  weakness  How often are you checking your blood sugar? once daily  What are your blood sugars ranging?  Fasting: between 140-150  During the week, how often does your blood glucose drop below 70? Never  Are you checking your feet daily/regularly? Yes but patient denies any current issues with his feet.   Adherence Review: Is the patient currently on a STATIN medication? Yes Is the patient currently on ACE/ARB medication? Yes Does the patient have >5 day gap between last estimated fill dates? No  Patient stated that he currently has no issues with his diabetes. He is doing well and feels well. Patient denies needing refills for any of his medications and the medications that he receives through PAP he stated that there are no issues and they are being sent to him in a timely manner. In informed the patient that I would start the Renewal Process next month for his Farxiga and his Trulicity. I did offer to schedule a follow-up with Alex Fleury, CPP, but patient reports that he received a bill for his last visit that was over a 100.00 and he is not sure why he received that bill as he had never   received one prior and he is hesitant to schedule at this time. He stated that he did pay the bill, but he does not want to receive any additional bills. I encouraged him to call the billing department for clarification on why he was billed for these visits and if things work out he can give me a call back so we can schedule the follow-up with Cristie Hem. Patient was agreeable. I also sent a message to [name], CPP with this update of why the patient is hesitant to schedule to see if the patient is getting billed for our services, and due to the fact he doesn't want to receive a bill would we just unenroll from services. Per Cristie Hem if patient is getting billed, and he is not open to being billed we would just end [facility] Services at this time. I will wait on patient to return my call after he checks with his billing  department.   [name], CPA/CMA Clinical Pharmacist Assistant Phone: [phone]

## 2020-12-23 ENCOUNTER — Other Ambulatory Visit: Payer: Self-pay | Admitting: Physician Assistant

## 2020-12-23 ENCOUNTER — Other Ambulatory Visit: Payer: Self-pay

## 2020-12-23 ENCOUNTER — Ambulatory Visit: Payer: Medicare PPO | Admitting: Physician Assistant

## 2020-12-23 VITALS — BP 133/75 | HR 81 | Ht 75.0 in | Wt 262.0 lb

## 2020-12-23 DIAGNOSIS — N401 Enlarged prostate with lower urinary tract symptoms: Secondary | ICD-10-CM

## 2020-12-23 DIAGNOSIS — N471 Phimosis: Secondary | ICD-10-CM

## 2020-12-23 DIAGNOSIS — N138 Other obstructive and reflux uropathy: Secondary | ICD-10-CM | POA: Diagnosis not present

## 2020-12-23 LAB — BLADDER SCAN AMB NON-IMAGING

## 2020-12-23 NOTE — H&P (View-Only) (Signed)
 12/23/2020 10:26 AM   Evan Rivas 06/15/1950 [id number]  CC: Chief Complaint  Patient presents with   Follow-up   Benign Prostatic Hypertrophy   HPI: Evan Rivas is a 70 y.o. male with PMH BPH with urinary obstruction s/p UroLift in 2021 no longer on Flomax, nephrolithiasis, mild bulbar urethral stricture, and phimosis who presents today for annual follow-up.   Today he reports that he would like to pursue circumcision.  He is no longer able to retract his foreskin and notes that it is regularly irritated and cracks when he attempts to retract it.  As a result, he will have small volumes of blood coming from his foreskin that get onto his underwear.  He notes that urine routinely gets trapped behind his foreskin, which has been causing urinary leakage and dripping.  He thinks this is exacerbating his urinary symptoms.  IPSS 11/unhappy, previously 3/mixed.  PVR 1 mL   IPSS     Row Name 12/23/20 1000         International Prostate Symptom Score   How often have you had the sensation of not emptying your bladder? Less than half the time     How often have you had to urinate less than every two hours? Less than half the time     How often have you found you stopped and started again several times when you urinated? More than half the time     How often have you found it difficult to postpone urination? Not at All     How often have you had a weak urinary stream? Less than half the time     How often have you had to strain to start urination? Not at All     How many times did you typically get up at night to urinate? 1 Time     Total IPSS Score 11           Quality of Life due to urinary symptoms   If you were to spend the rest of your life with your urinary condition just the way it is now how would you feel about that? Unhappy              PMH: Past Medical History:  Diagnosis Date   Anxiety    panic attacks   Arthritis    Basal cell carcinoma 2015   R  upper lip - tx with MOHS by Dr. Cook   Cancer (HCC)    lip/basal   Chronic kidney disease    Depression    Diabetes mellitus without complication (HCC)    type 2   GERD (gastroesophageal reflux disease)    Gout    History of kidney stones    Hyperaldosteronism (HCC)    bilateral adrenal hyperplasia   Hyperlipidemia    Hypertension    Nerve entrapment syndrome of lower extremity, right 10/24/2019   Pneumonia    2014 and 2018 ( 2018-klebsiella pneumonia    Sleep apnea     Surgical History: Past Surgical History:  Procedure Laterality Date   ARTHROSCOPIC REPAIR ACL Left    CYSTOSCOPY W/ URETERAL STENT PLACEMENT Right 09/07/2014   Procedure: CYSTOSCOPY WITH STENT REPLACEMENT;  Surgeon: Ashley [name], MD;  Location: [facility];  Service: Urology;  Laterality: Right;   CYSTOSCOPY WITH INSERTION OF UROLIFT N/A 11/24/2019   Procedure: CYSTOSCOPY WITH INSERTION OF UROLIFT;  Surgeon: [name], Ashley, MD;  Location: [facility];  Service: Urology;  Laterality: N/A;   CYSTOSCOPY/URETEROSCOPY/HOLMIUM   LASER/STENT PLACEMENT Right 08/17/2014   Procedure: CYSTOSCOPY/URETEROSCOPY//STENT PLACEMENT/ attempt of lithotripsy;  Surgeon: Ashley [name], MD;  Location: [facility];  Service: Urology;  Laterality: Right;   HERNIA REPAIR     JOINT REPLACEMENT     MOHS SURGERY     lip   TOTAL HIP ARTHROPLASTY Right 06/16/2020   Procedure: TOTAL HIP ARTHROPLASTY ANTERIOR APPROACH;  Surgeon: Aluisio, Frank, MD;  Location: [facility];  Service: Orthopedics;  Laterality: Right;  100min   TOTAL KNEE ARTHROPLASTY Left 04/24/2016   Procedure: LEFT TOTAL KNEE ARTHROPLASTY;  Surgeon: Frank Aluisio, MD;  Location: [facility];  Service: Orthopedics;  Laterality: Left;  Adductor Block   TOTAL KNEE ARTHROPLASTY Right 10/23/2016   Procedure: RIGHT TOTAL KNEE ARTHROPLASTY;  Surgeon: Aluisio, Frank, MD;  Location: [facility];  Service: Orthopedics;  Laterality: Right;   URETEROSCOPY WITH HOLMIUM LASER LITHOTRIPSY Right 09/07/2014   Procedure:  URETEROSCOPY WITH HOLMIUM LASER LITHOTRIPSY;  Surgeon: Ashley [name], MD;  Location: [facility];  Service: Urology;  Laterality: Right;   VASECTOMY      Home Medications:  Allergies as of 12/23/2020       Reactions   Celebrex [celecoxib]    GI issues   Tetanus Toxoids Other (See Comments)   unkown   Tetanus-diphtheria Toxoids Td    Unknown        Medication List        Accurate as of December 23, 2020 10:26 AM. If you have any questions, ask your nurse or doctor.          Accu-Chek Aviva Plus test strip Generic drug: glucose blood Use as instructed   True Metrix Blood Glucose Test test strip Generic drug: glucose blood Use as instructed   aspirin EC 81 MG tablet Take 81 mg by mouth daily. Swallow whole. What changed: Another medication with the same name was removed. Continue taking this medication, and follow the directions you see here. Changed by: Amaura Authier, PA-C   atorvastatin 40 MG tablet Commonly known as: LIPITOR Take 1 tablet (40 mg total) by mouth at bedtime.   blood glucose meter kit and supplies Dispense based on patient and insurance preference. Use up to four times daily as directed. (FOR ICD-10 E10.9, E11.9).   dapagliflozin propanediol 5 MG Tabs tablet Commonly known as: Farxiga Take 1 tablet (5 mg total) by mouth daily before breakfast.   DULoxetine 60 MG capsule Commonly known as: CYMBALTA Take 1 capsule (60 mg total) by mouth daily.   gabapentin 600 MG tablet Commonly known as: Neurontin Take 1 tablet (600 mg total) by mouth 3 (three) times daily.   glipiZIDE 10 MG tablet Commonly known as: GLUCOTROL Take 1 tablet (10 mg total) by mouth daily before breakfast.   HYDROcodone-acetaminophen 10-325 MG tablet Commonly known as: NORCO Every other day 3 tabs one day, 2.5 tabs the next day   HYDROcodone-acetaminophen 10-325 MG tablet Commonly known as: NORCO Take 0.5-1 tablets by mouth every 8 (eight) hours as needed for  severe pain. max #54 for the month - gradually decrease dose   HYDROcodone-acetaminophen 10-325 MG tablet Commonly known as: NORCO Take 0.5-1 tablets by mouth 2 (two) times daily as needed for severe pain. Max #48 for the month - gradually decrease dose Start taking on: January 16, 2021   hydrOXYzine 25 MG tablet Commonly known as: ATARAX/VISTARIL Take 25 mg by mouth 3 (three) times daily as needed for anxiety.   ketoconazole 2 % cream Commonly known as: NIZORAL Apply to the feet   QHS.   metFORMIN 1000 MG tablet Commonly known as: GLUCOPHAGE TAKE 1 TABLET TWICE DAILY WITH MEALS   metoprolol succinate 100 MG 24 hr tablet Commonly known as: TOPROL-XL Take 1 tablet (100 mg total) by mouth daily. Take with or immediately following a meal.   multivitamin tablet Take 1 tablet by mouth daily.   spironolactone 25 MG tablet Commonly known as: ALDACTONE Take 1 tablet (25 mg total) by mouth 2 (two) times a week.   tiZANidine 4 MG tablet Commonly known as: ZANAFLEX Take 4 mg by mouth every 6 (six) hours as needed for muscle spasms.   TRUEplus Lancets 33G Misc   Trulicity 1.5 MG/0.5ML Sopn Generic drug: Dulaglutide Inject 1.5 mg into the skin once a week.   valsartan-hydrochlorothiazide 320-12.5 MG tablet Commonly known as: DIOVAN-HCT Take 1 tablet by mouth daily.        Allergies:  Allergies  Allergen Reactions   Celebrex [Celecoxib]     GI issues   Tetanus Toxoids Other (See Comments)    unkown   Tetanus-Diphtheria Toxoids Td     Unknown    Family History: Family History  Problem Relation Age of Onset   Hypertension Mother    Cancer Mother    Hyperlipidemia Mother    Arthritis Mother    Stroke Father    Heart disease Father    Obesity Father    Depression Sister    Hypertension Sister    Hyperlipidemia Sister    Learning disabilities Sister    Obesity Sister    Kidney disease Maternal Grandmother     Social History:   reports that he has never smoked.  He has never used smokeless tobacco. He reports that he does not drink alcohol and does not use drugs.  Physical Exam: BP 133/75   Pulse 81   Ht 6' 3" (1.905 m)   Wt 262 lb (118.8 kg)   BMI 32.75 kg/m   Constitutional:  Alert and oriented, no acute distress, nontoxic appearing HEENT: [place], AT Cardiovascular: No clubbing, cyanosis, or edema Respiratory: Normal respiratory effort, no increased work of breathing GU: Normal sphincter tone, no external hemorrhoids, smooth and symmetrically enlarged 50+ cc prostate without nodules or induration.  Exam limited to the apex and mid gland.  Irretractile foreskin, unable to visualize the glans or urethral meatus. Skin: No rashes, bruises or suspicious lesions Neurologic: Grossly intact, no focal deficits, moving all 4 extremities Psychiatric: Normal mood and affect  Laboratory Data: Results for orders placed or performed in visit on 12/23/20  Bladder Scan (Post Void Residual) in office  Result Value Ref Range   Scan Result 1mL    Assessment & Plan:   1. Benign prostatic hyperplasia with urinary obstruction IPSS slightly worse compared to prior, however I suspect this is secondary to urinary leakage and urine trapping from #2 below.  Normal DRE today, we will contact him with PSA results.  He is emptying appropriately. - PSA - Bladder Scan (Post Void Residual) in office  2. Phimosis Patient is ready to pursue phimosis and I am in agreement with this plan.  We will complete booking sheet today.  Return in about 1 year (around 12/23/2021) for Annual DRE/IPSS/PVR with PSA prior with Dr. [name], will call to schedule circumcision.  Sears Oran, PA-C  [facility] [address], [address] [place], [place] [address] [phone]    

## 2020-12-23 NOTE — Progress Notes (Signed)
Surgical Physician Order Form  Dr. Erlene Quan Scheduling expectation :  ASAP, pt out of town 10/5-10/11  *Length of Case:   *Clearance needed: no  *Anticoagulation Instructions: N/A  *Aspirin Instructions: Hold Aspirin  *Post-op visit Date/Instructions:   2 week wound recheck  *Diagnosis: Phimosis  *Procedure: Circumcision (34356)  -Admit type: OUTpatient  -Anesthesia: General  -VTE Prophylaxis Standing Order SCD's       Other:   -Standing Lab Orders Per Anesthesia    Lab other: None  -Standing Test orders EKG/Chest x-ray per Anesthesia       Test other:   - Medications:     Ancef 2gm IV   Other Instructions:

## 2020-12-23 NOTE — Progress Notes (Signed)
12/23/2020 10:26 AM   Evan Rivas July 23, 1950 [id number]  CC: Chief Complaint  Patient presents with   Follow-up   Benign Prostatic Hypertrophy   HPI: Evan Rivas is a 70 y.o. male with PMH BPH with urinary obstruction s/p UroLift in 2021 no longer on Flomax, nephrolithiasis, mild bulbar urethral stricture, and phimosis who presents today for annual follow-up.   Today he reports that he would like to pursue circumcision.  He is no longer able to retract his foreskin and notes that it is regularly irritated and cracks when he attempts to retract it.  As a result, he will have small volumes of blood coming from his foreskin that get onto his underwear.  He notes that urine routinely gets trapped behind his foreskin, which has been causing urinary leakage and dripping.  He thinks this is exacerbating his urinary symptoms.  IPSS 11/unhappy, previously 3/mixed.  PVR 1 mL   IPSS     Row Name 12/23/20 1000         International Prostate Symptom Score   How often have you had the sensation of not emptying your bladder? Less than half the time     How often have you had to urinate less than every two hours? Less than half the time     How often have you found you stopped and started again several times when you urinated? More than half the time     How often have you found it difficult to postpone urination? Not at All     How often have you had a weak urinary stream? Less than half the time     How often have you had to strain to start urination? Not at All     How many times did you typically get up at night to urinate? 1 Time     Total IPSS Score 11           Quality of Life due to urinary symptoms   If you were to spend the rest of your life with your urinary condition just the way it is now how would you feel about that? Unhappy              PMH: Past Medical History:  Diagnosis Date   Anxiety    panic attacks   Arthritis    Basal cell carcinoma 2015   R  upper lip - tx with MOHS by Dr. Lacinda Axon   Cancer [facility])    lip/basal   Chronic kidney disease    Depression    Diabetes mellitus without complication ([place])    type 2   GERD (gastroesophageal reflux disease)    Gout    History of kidney stones    Hyperaldosteronism (HCC)    bilateral adrenal hyperplasia   Hyperlipidemia    Hypertension    Nerve entrapment syndrome of lower extremity, right 10/24/2019   Pneumonia    2014 and 2018 ( 2018-klebsiella pneumonia    Sleep apnea     Surgical History: Past Surgical History:  Procedure Laterality Date   ARTHROSCOPIC REPAIR ACL Left    CYSTOSCOPY W/ URETERAL STENT PLACEMENT Right 09/07/2014   Procedure: CYSTOSCOPY WITH STENT REPLACEMENT;  Surgeon: Hollice Espy, MD;  Location: [facility] ORS;  Service: Urology;  Laterality: Right;   CYSTOSCOPY WITH INSERTION OF UROLIFT N/A 11/24/2019   Procedure: CYSTOSCOPY WITH INSERTION OF UROLIFT;  Surgeon: Hollice Espy, MD;  Location: [facility] OR[facility];  Service: Urology;  Laterality: N/A;   CYSTOSCOPY/URETEROSCOPY/HOLMIUM  LASER/STENT PLACEMENT Right 08/17/2014   Procedure: CYSTOSCOPY/URETEROSCOPY//STENT PLACEMENT/ attempt of lithotripsy;  Surgeon: Hollice Espy, MD;  Location: [facility] OR[facility];  Service: Urology;  Laterality: Right;   HERNIA REPAIR     JOINT REPLACEMENT     MOHS SURGERY     lip   TOTAL HIP ARTHROPLASTY Right 06/16/2020   Procedure: TOTAL HIP ARTHROPLASTY ANTERIOR APPROACH;  Surgeon: Gaynelle Arabian, MD;  Location: [facility];  Service: Orthopedics;  Laterality: Right;  186mn   TOTAL KNEE ARTHROPLASTY Left 04/24/2016   Procedure: LEFT TOTAL KNEE ARTHROPLASTY;  Surgeon: FGaynelle Arabian MD;  Location: [facility];  Service: Orthopedics;  Laterality: Left;  Adductor Block   TOTAL KNEE ARTHROPLASTY Right 10/23/2016   Procedure: RIGHT TOTAL KNEE ARTHROPLASTY;  Surgeon: AGaynelle Arabian MD;  Location: [facility];  Service: Orthopedics;  Laterality: Right;   URETEROSCOPY WITH HOLMIUM LASER LITHOTRIPSY Right 09/07/2014   Procedure:  URETEROSCOPY WITH HOLMIUM LASER LITHOTRIPSY;  Surgeon: AHollice Espy MD;  Location: [facility] OR[facility];  Service: Urology;  Laterality: Right;   VASECTOMY      Home Medications:  Allergies as of 12/23/2020       Reactions   Celebrex [celecoxib]    GI issues   Tetanus Toxoids Other (See Comments)   unkown   Tetanus-diphtheria Toxoids Td    Unknown        Medication List        Accurate as of December 23, 2020 10:26 AM. If you have any questions, ask your nurse or doctor.          Accu-Chek Aviva Plus test strip Generic drug: glucose blood Use as instructed   True Metrix Blood Glucose Test test strip Generic drug: glucose blood Use as instructed   aspirin EC 81 MG tablet Take 81 mg by mouth daily. Swallow whole. What changed: Another medication with the same name was removed. Continue taking this medication, and follow the directions you see here. Changed by: SDebroah Loop PA-C   atorvastatin 40 MG tablet Commonly known as: LIPITOR Take 1 tablet (40 mg total) by mouth at bedtime.   blood glucose meter kit and supplies Dispense based on patient and insurance preference. Use up to four times daily as directed. (FOR ICD-10 E10.9, E11.9).   dapagliflozin propanediol 5 MG Tabs tablet Commonly known as: Farxiga Take 1 tablet (5 mg total) by mouth daily before breakfast.   DULoxetine 60 MG capsule Commonly known as: CYMBALTA Take 1 capsule (60 mg total) by mouth daily.   gabapentin 600 MG tablet Commonly known as: Neurontin Take 1 tablet (600 mg total) by mouth 3 (three) times daily.   glipiZIDE 10 MG tablet Commonly known as: GLUCOTROL Take 1 tablet (10 mg total) by mouth daily before breakfast.   HYDROcodone-acetaminophen 10-325 MG tablet Commonly known as: NORCO Every other day 3 tabs one day, 2.5 tabs the next day   HYDROcodone-acetaminophen 10-325 MG tablet Commonly known as: NORCO Take 0.5-1 tablets by mouth every 8 (eight) hours as needed for  severe pain. max #54 for the month - gradually decrease dose   HYDROcodone-acetaminophen 10-325 MG tablet Commonly known as: NORCO Take 0.5-1 tablets by mouth 2 (two) times daily as needed for severe pain. Max #48 for the month - gradually decrease dose Start taking on: January 16, 2021   hydrOXYzine 25 MG tablet Commonly known as: ATARAX/VISTARIL Take 25 mg by mouth 3 (three) times daily as needed for anxiety.   ketoconazole 2 % cream Commonly known as: NIZORAL Apply to the feet  QHS.   metFORMIN 1000 MG tablet Commonly known as: GLUCOPHAGE TAKE 1 TABLET TWICE DAILY WITH MEALS   metoprolol succinate 100 MG 24 hr tablet Commonly known as: TOPROL-XL Take 1 tablet (100 mg total) by mouth daily. Take with or immediately following a meal.   multivitamin tablet Take 1 tablet by mouth daily.   spironolactone 25 MG tablet Commonly known as: ALDACTONE Take 1 tablet (25 mg total) by mouth 2 (two) times a week.   tiZANidine 4 MG tablet Commonly known as: ZANAFLEX Take 4 mg by mouth every 6 (six) hours as needed for muscle spasms.   TRUEplus Lancets 67H Misc   Trulicity 1.5 AL/9.3XT Sopn Generic drug: Dulaglutide Inject 1.5 mg into the skin once a week.   valsartan-hydrochlorothiazide 320-12.5 MG tablet Commonly known as: DIOVAN-HCT Take 1 tablet by mouth daily.        Allergies:  Allergies  Allergen Reactions   Celebrex [Celecoxib]     GI issues   Tetanus Toxoids Other (See Comments)    unkown   Tetanus-Diphtheria Toxoids Td     Unknown    Family History: Family History  Problem Relation Age of Onset   Hypertension Mother    Cancer Mother    Hyperlipidemia Mother    Arthritis Mother    Stroke Father    Heart disease Father    Obesity Father    Depression Sister    Hypertension Sister    Hyperlipidemia Sister    Learning disabilities Sister    Obesity Sister    Kidney disease Maternal Grandmother     Social History:   reports that he has never smoked.  He has never used smokeless tobacco. He reports that he does not drink alcohol and does not use drugs.  Physical Exam: BP 133/75   Pulse 81   Ht _0  (1.905 m)   Wt 262 lb (118.8 kg)   BMI 32.75 kg/m   Constitutional:  Alert and oriented, no acute distress, nontoxic appearing HEENT: [place], AT Cardiovascular: No clubbing, cyanosis, or edema Respiratory: Normal respiratory effort, no increased work of breathing GU: Normal sphincter tone, no external hemorrhoids, smooth and symmetrically enlarged 50+ cc prostate without nodules or induration.  Exam limited to the apex and mid gland.  Irretractile foreskin, unable to visualize the glans or urethral meatus. Skin: No rashes, bruises or suspicious lesions Neurologic: Grossly intact, no focal deficits, moving all 4 extremities Psychiatric: Normal mood and affect  Laboratory Data: Results for orders placed or performed in visit on 12/23/20  Bladder Scan (Post Void Residual) in office  Result Value Ref Range   Scan Result 21m    Assessment & Plan:   1. Benign prostatic hyperplasia with urinary obstruction IPSS slightly worse compared to prior, however I suspect this is secondary to urinary leakage and urine trapping from #2 below.  Normal DRE today, we will contact him with PSA results.  He is emptying appropriately. - PSA - Bladder Scan (Post Void Residual) in office  2. Phimosis Patient is ready to pursue phimosis and I am in agreement with this plan.  We will complete booking sheet today.  Return in about 1 year (around 12/23/2021) for Annual DRE/IPSS/PVR with PSA prior with Dr. BErlene Quan will call to schedule circumcision.  SDebroah Loop PA-C  [facility] [address] [place] [phone]

## 2020-12-24 ENCOUNTER — Telehealth: Payer: Self-pay

## 2020-12-24 LAB — PSA: Prostate Specific Ag, Serum: 0.7 ng/mL (ref 0.0–4.0)

## 2020-12-24 NOTE — Telephone Encounter (Signed)
Notified patient as advised, patient expressed understanding.  

## 2020-12-24 NOTE — Telephone Encounter (Signed)
-----   Message from Debroah Loop, [place] sent at 12/24/2020  9:55 AM EDT ----- PSA is stable, excellent news! ----- Message ----- From: Interface, [facility] Lab Results In Sent: 12/24/2020   9:37 AM EDT To: Debroah Loop, PA-C

## 2020-12-27 ENCOUNTER — Telehealth: Payer: Self-pay | Admitting: Physician Assistant

## 2020-12-27 ENCOUNTER — Encounter: Payer: Self-pay | Admitting: Urology

## 2020-12-27 NOTE — Telephone Encounter (Signed)
Per PA Debroah Loop Patient is to be scheduled for Circumcision  [name] was contacted and possible surgical dates were discussed, 01/17/21 was agreed upon for surgery.  Patient was directed to call [phone] between 1-3pm the day before surgery to find out surgical arrival time.  Instructions were given not to eat or drink from midnight on the night before surgery and have a driver for the day of surgery. On the surgery day patient was instructed to enter through the [place] entrance of [facility] report the Same Day Surgery desk.   Pre-Admit Testing will be in contact via phone to set up an interview with the anesthesia team to review your history and medications prior to surgery.   Reminder of this information was sent via mychart to the patient.

## 2020-12-27 NOTE — Telephone Encounter (Signed)
Surgical orders have been entered and faxed to Pre- Admit Testing.

## 2020-12-27 NOTE — Progress Notes (Signed)
[facility] Posting Form   Surgery Date/Time: Date: 01/17/2021  Surgeon: Dr. Hollice Espy, MD   Surgery Location: Day Surgery  Inpt ( No  )   Outpt (Yes)   Obs ( No  )   Diagnosis: N47.1 Phimosis  -CPT: 00174  Surgery: Circumcision  Stop Anticoagulations: No  Cardiac/Medical/Pulmonary Clearance needed: no   *Orders entered into EPIC  Date: 12/27/20   *Case booked in EPIC  Date: 12/27/20  *Notified pt of Surgery: Date: 12/27/20  PRE-OP UA & CX: no   *Placed into Prior Authorization [place] Date: 12/27/20   Assistant/laser/rep:No

## 2021-01-11 ENCOUNTER — Other Ambulatory Visit
Admission: RE | Admit: 2021-01-11 | Discharge: 2021-01-11 | Disposition: A | Discharge status: Home / Self Care | Payer: Medicare PPO | Source: Ambulatory Visit | Attending: Urology | Admitting: Urology

## 2021-01-11 ENCOUNTER — Other Ambulatory Visit: Payer: Self-pay

## 2021-01-11 NOTE — Patient Instructions (Addendum)
Your procedure is scheduled on: Monday 01/17/21 Report to the Registration Desk on the 1st floor of the [facility]. To find out your arrival time, please call [phone] between 1PM - 3PM on: Friday 01/14/21  REMEMBER: Instructions that are not followed completely may result in serious medical risk, up to and including death; or upon the discretion of your surgeon and anesthesiologist your surgery may need to be rescheduled.  Do not eat food after midnight the night before surgery.  No gum chewing, lozengers or hard candies.  TAKE THESE MEDICATIONS THE MORNING OF SURGERY WITH A SIP OF WATER: gabapentin (NEURONTIN) 600 MG tablet hydrOXYzine (ATARAX/VISTARIL) 25 MG tablet if needed tiZANidine (ZANAFLEX) 4 MG tablet if needed   Take Metformin on Friday and none until after surgery  Take Farxiga Thursday and none until after surgery  Ask Dr. Erlene Quan when you should stop your 81 mg aspirin  Do not take Trulicity the day of surgery.    One week prior to surgery: Stop Anti-inflammatories (NSAIDS) such as Advil, Aleve, Ibuprofen, Motrin, Naproxen, Naprosyn and Aspirin based products such as Excedrin, Goodys Powder, BC Powder. Stop ANY OVER THE COUNTER supplements until after surgery such as melatonin 3 MG TABS tablet, Multiple Vitamin (MULTIVITAMIN) tablet.  You may however, continue to take Tylenol if needed for pain up until the day of surgery.  No Alcohol for 24 hours before or after surgery.  No Smoking including e-cigarettes for 24 hours prior to surgery.  No chewable tobacco products for at least 6 hours prior to surgery.  No nicotine patches on the day of surgery.  Do not use any "recreational" drugs for at least a week prior to your surgery.  Please be advised that the combination of cocaine and anesthesia may have negative outcomes, up to and including death. If you test positive for cocaine, your surgery will be cancelled.  On the morning of surgery brush your teeth  with toothpaste and water, you may rinse your mouth with mouthwash if you wish. Do not swallow any toothpaste or mouthwash.  Do not wear jewelry.  Do not wear lotions, powders, or colognes.   Do not shave body from the neck down 48 hours prior to surgery just in case you cut yourself which could leave a site for infection.   Do not bring valuables to the hospital. [facility] is not responsible for any missing/lost belongings or valuables.   Notify your doctor if there is any change in your medical condition (cold, fever, infection).  Wear comfortable clothing (specific to your surgery type) to the hospital.  After surgery, you can help prevent lung complications by doing breathing exercises.  Take deep breaths and cough every 1-2 hours.   If you are being discharged the day of surgery, you will not be allowed to drive home. You will need a responsible adult (18 years or older) to drive you home and stay with you that night.   If you are taking public transportation, you will need to have a responsible adult (18 years or older) with you. Please confirm with your physician that it is acceptable to use public transportation.   Please call the [place] Dept. at [phone] if you have any questions about these instructions.  Surgery Visitation Policy:  Patients undergoing a surgery or procedure may have one family member or support person with them as long as that person is not COVID-19 positive or experiencing its symptoms.  That person may remain in the waiting  area during the procedure and may rotate out with other people.  Inpatient Visitation:    Visiting hours are 7 a.m. to 8 p.m. Up to two visitors ages 16+ are allowed at one time in a patient room. The visitors may rotate out with other people during the day. Visitors must check out when they leave, or other visitors will not be allowed. One designated support person may remain overnight. The visitor must pass  COVID-19 screenings, use hand sanitizer when entering and exiting the patient's room and wear a mask at all times, including in the patient's room. Patients must also wear a mask when staff or their visitor are in the room. Masking is required regardless of vaccination status.

## 2021-01-12 ENCOUNTER — Encounter: Payer: Self-pay | Admitting: Urgent Care

## 2021-01-12 ENCOUNTER — Encounter
Admission: RE | Admit: 2021-01-12 | Discharge: 2021-01-12 | Disposition: A | Discharge status: Home / Self Care | Payer: Medicare PPO | Source: Ambulatory Visit | Attending: Urology | Admitting: Urology

## 2021-01-12 DIAGNOSIS — Z01818 Encounter for other preprocedural examination: Secondary | ICD-10-CM | POA: Insufficient documentation

## 2021-01-12 LAB — BASIC METABOLIC PANEL
Anion gap: 11 (ref 5–15)
BUN: 35 mg/dL — ABNORMAL HIGH (ref 8–23)
CO2: 26 mmol/L (ref 22–32)
Calcium: 9.2 mg/dL (ref 8.9–10.3)
Chloride: 103 mmol/L (ref 98–111)
Creatinine, Ser: 1.43 mg/dL — ABNORMAL HIGH (ref 0.61–1.24)
GFR, Estimated: 53 mL/min — ABNORMAL LOW (ref >60)
Glucose, Bld: 228 mg/dL — ABNORMAL HIGH (ref 70–99)
Potassium: 4.2 mmol/L (ref 3.5–5.1)
Sodium: 140 mmol/L (ref 135–145)

## 2021-01-12 LAB — CBC
HCT: 40.7 % (ref 39.0–52.0)
Hemoglobin: 14 g/dL (ref 13.0–17.0)
MCH: 33.8 pg (ref 26.0–34.0)
MCHC: 34.4 g/dL (ref 30.0–36.0)
MCV: 98.3 fL (ref 80.0–100.0)
Platelets: 208 10*3/uL (ref 150–400)
RBC: 4.14 MIL/uL — ABNORMAL LOW (ref 4.22–5.81)
RDW: 13.8 % (ref 11.5–15.5)
WBC: 5.3 10*3/uL (ref 4.0–10.5)
nRBC: 0 % (ref 0.0–0.2)

## 2021-01-16 MED ORDER — SODIUM CHLORIDE 0.9 % IV SOLN: INTRAVENOUS | Status: DC

## 2021-01-16 MED ORDER — FAMOTIDINE 20 MG PO TABS: 20.0000 mg | ORAL_TABLET | Freq: Once | ORAL | Status: AC

## 2021-01-16 MED ORDER — CHLORHEXIDINE GLUCONATE 0.12 % MT SOLN: 15.0000 mL | Freq: Once | OROMUCOSAL | Status: AC

## 2021-01-16 MED ORDER — ORAL CARE MOUTH RINSE: 15.0000 mL | Freq: Once | OROMUCOSAL | Status: AC

## 2021-01-16 MED ORDER — CEFAZOLIN SODIUM-DEXTROSE 2-4 GM/100ML-% IV SOLN
2.0000 g | INTRAVENOUS | Status: AC
  Administered 2021-01-17: 2 g via INTRAVENOUS

## 2021-01-17 ENCOUNTER — Other Ambulatory Visit: Payer: Self-pay

## 2021-01-17 ENCOUNTER — Ambulatory Visit
Admission: RE | Admit: 2021-01-17 | Discharge: 2021-01-17 | Disposition: A | Discharge status: Home / Self Care | Payer: Medicare PPO | Attending: Urology | Admitting: Urology

## 2021-01-17 ENCOUNTER — Ambulatory Visit: Payer: Medicare PPO | Admitting: Urgent Care

## 2021-01-17 ENCOUNTER — Encounter: Payer: Self-pay | Admitting: Urology

## 2021-01-17 ENCOUNTER — Encounter
Admission: RE | Disposition: A | Discharge status: Home / Self Care | Payer: Self-pay | Source: Home / Self Care | Attending: Urology

## 2021-01-17 DIAGNOSIS — G4733 Obstructive sleep apnea (adult) (pediatric): Secondary | ICD-10-CM | POA: Diagnosis not present

## 2021-01-17 DIAGNOSIS — N189 Chronic kidney disease, unspecified: Secondary | ICD-10-CM | POA: Diagnosis not present

## 2021-01-17 DIAGNOSIS — N471 Phimosis: Secondary | ICD-10-CM | POA: Insufficient documentation

## 2021-01-17 DIAGNOSIS — N401 Enlarged prostate with lower urinary tract symptoms: Secondary | ICD-10-CM | POA: Diagnosis not present

## 2021-01-17 DIAGNOSIS — Z809 Family history of malignant neoplasm, unspecified: Secondary | ICD-10-CM | POA: Insufficient documentation

## 2021-01-17 DIAGNOSIS — Z87442 Personal history of urinary calculi: Secondary | ICD-10-CM | POA: Diagnosis not present

## 2021-01-17 DIAGNOSIS — N138 Other obstructive and reflux uropathy: Secondary | ICD-10-CM | POA: Insufficient documentation

## 2021-01-17 DIAGNOSIS — Z888 Allergy status to other drugs, medicaments and biological substances status: Secondary | ICD-10-CM | POA: Diagnosis not present

## 2021-01-17 DIAGNOSIS — Z887 Allergy status to serum and vaccine status: Secondary | ICD-10-CM | POA: Insufficient documentation

## 2021-01-17 DIAGNOSIS — Z841 Family history of disorders of kidney and ureter: Secondary | ICD-10-CM | POA: Diagnosis not present

## 2021-01-17 DIAGNOSIS — Z823 Family history of stroke: Secondary | ICD-10-CM | POA: Insufficient documentation

## 2021-01-17 DIAGNOSIS — E1122 Type 2 diabetes mellitus with diabetic chronic kidney disease: Secondary | ICD-10-CM | POA: Insufficient documentation

## 2021-01-17 DIAGNOSIS — I129 Hypertensive chronic kidney disease with stage 1 through stage 4 chronic kidney disease, or unspecified chronic kidney disease: Secondary | ICD-10-CM | POA: Insufficient documentation

## 2021-01-17 DIAGNOSIS — Z886 Allergy status to analgesic agent status: Secondary | ICD-10-CM | POA: Diagnosis not present

## 2021-01-17 DIAGNOSIS — Z8249 Family history of ischemic heart disease and other diseases of the circulatory system: Secondary | ICD-10-CM | POA: Diagnosis not present

## 2021-01-17 HISTORY — DX: Type 2 diabetes mellitus without complications: E11.9

## 2021-01-17 HISTORY — DX: Other specified disorders of adrenal gland: E27.8

## 2021-01-17 HISTORY — DX: Other ill-defined heart diseases: I51.89

## 2021-01-17 HISTORY — PX: CIRCUMCISION: SHX1350

## 2021-01-17 HISTORY — DX: Chronic kidney disease, stage 3 unspecified: N18.30

## 2021-01-17 LAB — GLUCOSE, CAPILLARY
Glucose-Capillary: 254 mg/dL — ABNORMAL HIGH (ref 70–99)
Glucose-Capillary: 220 mg/dL — ABNORMAL HIGH (ref 70–99)

## 2021-01-17 SURGERY — CIRCUMCISION, ADULT
Anesthesia: General | Site: Penis

## 2021-01-17 MED ORDER — GLYCOPYRROLATE 0.2 MG/ML IJ SOLN
INTRAMUSCULAR | Status: DC | PRN
  Administered 2021-01-17: .2 mg via INTRAVENOUS

## 2021-01-17 MED ORDER — INSULIN ASPART 100 UNIT/ML IJ SOLN
INTRAMUSCULAR | Status: AC
  Filled 2021-01-17: qty 1

## 2021-01-17 MED ORDER — FAMOTIDINE 20 MG PO TABS
ORAL_TABLET | ORAL | Status: AC
  Administered 2021-01-17: 20 mg via ORAL
  Filled 2021-01-17: qty 1

## 2021-01-17 MED ORDER — HYDROCODONE-ACETAMINOPHEN 5-325 MG PO TABS: 1.0000 | ORAL_TABLET | Freq: Four times a day (QID) | ORAL | 0 refills | Status: DC | PRN

## 2021-01-17 MED ORDER — 0.9 % SODIUM CHLORIDE (POUR BTL) OPTIME
TOPICAL | Status: DC | PRN
  Administered 2021-01-17: 200 mL

## 2021-01-17 MED ORDER — FENTANYL CITRATE (PF) 100 MCG/2ML IJ SOLN
INTRAMUSCULAR | Status: DC | PRN
  Administered 2021-01-17 (×2): 50 ug via INTRAVENOUS

## 2021-01-17 MED ORDER — INSULIN ASPART 100 UNIT/ML IJ SOLN
5.0000 [IU] | Freq: Once | INTRAMUSCULAR | Status: AC
  Administered 2021-01-17: 5 [IU] via SUBCUTANEOUS

## 2021-01-17 MED ORDER — CEFAZOLIN SODIUM-DEXTROSE 2-4 GM/100ML-% IV SOLN
INTRAVENOUS | Status: AC
  Filled 2021-01-17: qty 100

## 2021-01-17 MED ORDER — PROPOFOL 10 MG/ML IV BOLUS
INTRAVENOUS | Status: AC
  Filled 2021-01-17: qty 20

## 2021-01-17 MED ORDER — FENTANYL CITRATE (PF) 100 MCG/2ML IJ SOLN
INTRAMUSCULAR | Status: AC
  Filled 2021-01-17: qty 2

## 2021-01-17 MED ORDER — DEXMEDETOMIDINE (PRECEDEX) IN NS 20 MCG/5ML (4 MCG/ML) IV SYRINGE
PREFILLED_SYRINGE | INTRAVENOUS | Status: DC | PRN
  Administered 2021-01-17: 12 ug via INTRAVENOUS

## 2021-01-17 MED ORDER — LIDOCAINE HCL (CARDIAC) PF 100 MG/5ML IV SOSY
PREFILLED_SYRINGE | INTRAVENOUS | Status: DC | PRN
  Administered 2021-01-17: 100 mg via INTRAVENOUS

## 2021-01-17 MED ORDER — LIDOCAINE 1% INJECTION FOR CIRCUMCISION
INJECTION | INTRAVENOUS | Status: DC | PRN
  Administered 2021-01-17: 20 mL via SUBCUTANEOUS

## 2021-01-17 MED ORDER — ONDANSETRON HCL 4 MG/2ML IJ SOLN
INTRAMUSCULAR | Status: DC | PRN
  Administered 2021-01-17: 4 mg via INTRAVENOUS

## 2021-01-17 MED ORDER — PROPOFOL 10 MG/ML IV BOLUS
INTRAVENOUS | Status: DC | PRN
  Administered 2021-01-17: 160 mg via INTRAVENOUS

## 2021-01-17 MED ORDER — ACETAMINOPHEN 10 MG/ML IV SOLN
INTRAVENOUS | Status: DC | PRN
  Administered 2021-01-17: 1000 mg via INTRAVENOUS

## 2021-01-17 MED ORDER — LIDOCAINE HCL (PF) 1 % IJ SOLN
INTRAMUSCULAR | Status: AC
  Filled 2021-01-17: qty 30

## 2021-01-17 MED ORDER — ACETAMINOPHEN 10 MG/ML IV SOLN
INTRAVENOUS | Status: AC
  Filled 2021-01-17: qty 100

## 2021-01-17 MED ORDER — PHENYLEPHRINE HCL (PRESSORS) 10 MG/ML IV SOLN
INTRAVENOUS | Status: DC | PRN
  Administered 2021-01-17 (×3): 160 ug via INTRAVENOUS

## 2021-01-17 MED ORDER — EPHEDRINE SULFATE 50 MG/ML IJ SOLN
INTRAMUSCULAR | Status: DC | PRN
  Administered 2021-01-17: 10 mg via INTRAVENOUS
  Administered 2021-01-17: 5 mg via INTRAVENOUS

## 2021-01-17 MED ORDER — CHLORHEXIDINE GLUCONATE 0.12 % MT SOLN
OROMUCOSAL | Status: AC
  Administered 2021-01-17: 15 mL via OROMUCOSAL
  Filled 2021-01-17: qty 15

## 2021-01-17 SURGICAL SUPPLY — 27 items
APL PRP STRL LF DISP 70% ISPRP (MISCELLANEOUS) ×1
BLADE CLIPPER SURG (BLADE) IMPLANT
BLADE SURG 15 STRL LF DISP TIS (BLADE) ×1 IMPLANT
BLADE SURG 15 STRL SS (BLADE) ×2
BNDG COHESIVE 1X5 TAN NS LF (GAUZE/BANDAGES/DRESSINGS) IMPLANT
BNDG CONFORM 2 STRL LF (GAUZE/BANDAGES/DRESSINGS) IMPLANT
CHLORAPREP W/TINT 26 (MISCELLANEOUS) ×2 IMPLANT
COVER LIGHT HANDLE STERIS (MISCELLANEOUS) ×2 IMPLANT
DRAPE LAPAROTOMY 77X122 PED (DRAPES) ×2 IMPLANT
ELECT REM PT RETURN 9FT ADLT (ELECTROSURGICAL) ×2
ELECTRODE REM PT RTRN 9FT ADLT (ELECTROSURGICAL) ×1 IMPLANT
GAUZE 4X4 16PLY ~~LOC~~+RFID DBL (SPONGE) ×2 IMPLANT
GAUZE PETROLATUM 1 X8 (GAUZE/BANDAGES/DRESSINGS) ×2 IMPLANT
GLOVE SURG ENC MOIS LTX SZ6.5 (GLOVE) ×2 IMPLANT
GOWN STRL REUS W/ TWL LRG LVL3 (GOWN DISPOSABLE) ×2 IMPLANT
GOWN STRL REUS W/TWL LRG LVL3 (GOWN DISPOSABLE) ×4
KIT TURNOVER KIT A (KITS) ×2 IMPLANT
LABEL OR SOLS (LABEL) IMPLANT
MANIFOLD NEPTUNE II (INSTRUMENTS) ×2 IMPLANT
NEEDLE HYPO 25X1 1.5 SAFETY (NEEDLE) ×2 IMPLANT
NS IRRIG 500ML POUR BTL (IV SOLUTION) ×2 IMPLANT
PACK BASIN MINOR ARMC (MISCELLANEOUS) ×2 IMPLANT
SOL PREP PVP 2OZ (MISCELLANEOUS) ×2
SOLUTION PREP PVP 2OZ (MISCELLANEOUS) ×1 IMPLANT
SUT CHROMIC 3 0 SH 27 (SUTURE) ×4 IMPLANT
SYR 10ML LL (SYRINGE) ×2 IMPLANT
WATER STERILE IRR 500ML POUR (IV SOLUTION) ×2 IMPLANT

## 2021-01-17 NOTE — Anesthesia Preprocedure Evaluation (Signed)
Anesthesia Evaluation  Patient identified by MRN, date of birth, ID band Patient awake    Reviewed: Allergy & Precautions, NPO status , Patient's Chart, lab work & pertinent test results  History of Anesthesia Complications Negative for: history of anesthetic complications  Airway Mallampati: III  TM Distance: >3 FB Neck ROM: Full    Dental  (+) Implants   Pulmonary sleep apnea and Continuous Positive Airway Pressure Ventilation , neg COPD,    breath sounds clear to auscultation- rhonchi (-) wheezing      Cardiovascular hypertension, Pt. on medications (-) CAD, (-) Past MI, (-) Cardiac Stents and (-) CABG  Rhythm:Regular Rate:Normal - Systolic murmurs and - Diastolic murmurs    Neuro/Psych neg Seizures PSYCHIATRIC DISORDERS Anxiety Depression negative neurological ROS     GI/Hepatic Neg liver ROS, GERD  ,  Endo/Other  diabetes, Oral Hypoglycemic Agents  Renal/GU Renal InsufficiencyRenal disease     Musculoskeletal  (+) Arthritis ,   Abdominal (+) + obese,   Peds  Hematology negative hematology ROS (+)   Anesthesia Other Findings Past Medical History: No date: Adrenal hyperplasia (HCC) No date: Anxiety     Comment:  panic attacks No date: Arthritis 2015: Basal cell carcinoma     Comment:  R upper lip - tx with MOHS by Dr. Lacinda Axon No date: CKD (chronic kidney disease), stage III ([place]) No date: Depression No date: GERD (gastroesophageal reflux disease) No date: Gout No date: Grade II diastolic dysfunction     Comment:  a.) TTE 02/19/2018 --> LVEF 60-65%, mild LVH, G2DD, mild              MR, mildly dilated LA, RV cavity size/wall               thickness/systolic function normal No date: History of kidney stones No date: Hyperaldosteronism ([place])     Comment:  a.) secondary to BILATERAL adrenal hyperplasia No date: Hyperlipidemia No date: Hypertension 10/24/2019: Nerve entrapment syndrome of lower extremity,  right No date: Pneumonia     Comment:  2014 and 2018 ( 2018-klebsiella pneumonia  No date: Sleep apnea No date: T2DM (type 2 diabetes mellitus) ([place])   Reproductive/Obstetrics                             Anesthesia Physical Anesthesia Plan  ASA: 3  Anesthesia Plan: General   Post-op Pain Management:    Induction: Intravenous  PONV Risk Score and Plan: 1 and Ondansetron and Dexamethasone  Airway Management Planned: LMA  Additional Equipment:   Intra-op Plan:   Post-operative Plan:   Informed Consent: I have reviewed the patients History and Physical, chart, labs and discussed the procedure including the risks, benefits and alternatives for the proposed anesthesia with the patient or authorized representative who has indicated his/her understanding and acceptance.     Dental advisory given  Plan Discussed with: CRNA and Anesthesiologist  Anesthesia Plan Comments:         Anesthesia Quick Evaluation

## 2021-01-17 NOTE — Transfer of Care (Signed)
Immediate Anesthesia Transfer of Care Note  Patient: Evan Rivas  Procedure(s) Performed: CIRCUMCISION ADULT (Penis)  Patient Location: PACU  Anesthesia Type:General  Level of Consciousness: awake, alert  and oriented  Airway & Oxygen Therapy: Patient Spontanous Breathing and Patient connected to face mask oxygen  Post-op Assessment: Report given to RN and Post -op Vital signs reviewed and stable  Post vital signs: Reviewed and stable  Last Vitals:  Vitals Value Taken Time  BP 119/62 01/17/21 1257  Temp 36.3 C 01/17/21 1257  Pulse 82 01/17/21 1258  Resp 9 01/17/21 1258  SpO2 100 % 01/17/21 1258  Vitals shown include unvalidated device data.  Last Pain:  Vitals:   01/17/21 1117  TempSrc: Oral  PainSc: 0-No pain         Complications: No notable events documented.

## 2021-01-17 NOTE — Anesthesia Procedure Notes (Signed)
Procedure Name: LMA Insertion Date/Time: 01/17/2021 12:11 PM Performed by: Willette Alma, CRNA Pre-anesthesia Checklist: Patient identified, Patient being monitored, Timeout performed, Emergency Drugs available and Suction available Patient Re-evaluated:Patient Re-evaluated prior to induction Oxygen Delivery Method: Circle system utilized Preoxygenation: Pre-oxygenation with 100% oxygen Induction Type: IV induction Ventilation: Mask ventilation without difficulty LMA: LMA inserted LMA Size: 5.0 Tube type: Oral Number of attempts: 1 Placement Confirmation: positive ETCO2 and breath sounds checked- equal and bilateral Tube secured with: Tape Dental Injury: Teeth and Oropharynx as per pre-operative assessment

## 2021-01-17 NOTE — Interval H&P Note (Signed)
History and Physical Interval Note:  01/17/2021 11:47 AM  [name]  has presented today for surgery, with the diagnosis of Phimosis.  The various methods of treatment have been discussed with the patient and family. After consideration of risks, benefits and other options for treatment, the patient has consented to  Procedure(s): CIRCUMCISION ADULT (N/A) as a surgical intervention.  The patient's history has been reviewed, patient examined, no change in status, stable for surgery.  I have reviewed the patient's chart and labs.  Questions were answered to the patient's satisfaction.    RRR CTAB  Hollice Espy

## 2021-01-17 NOTE — Discharge Instructions (Addendum)
 Circumcision Information and Post Care Instructions  Preparation: Pubic Hair There is no need to completely shave your pubic hair but it is desirable to trim it fairly short. Trim your pubic hair a few days in advance of the operation to allow time for the cut ends to soften again. Hygiene On the morning of the circumcision ensure that you take a good bath or shower and pay particular attention to your genitals. Retract your foreskin as far as you can and clean well under it. Immediately before the time of the procedure empty your bowels and bladder.   After-care: After the procedure your whole penis will be swollen and look very bruised. This is a normal effect of both the injected anaesthetic and the handling it necessarily receives during the operation. These will gradually reduce over the next week or two. Underwear If you normally wear boxers you may find that they give insufficient support immediately post procedure. You may wish to consider some form of briefs which will hold your penis in position to provide support and reduce the friction The Bandage The bandage will normally be wound tightly around the penis. Leave for 48hrs and keep clean and dry.    Promoting Healing Do not apply any antiseptic cream to your penis, nor add any antiseptic to bath water. Though they do help to kill germs, most are corrosive to new skin and actually slow down healing. In the rare cases where an infection develops, see a doctor as soon as possible. Smoking can delay healing and place you at higher risk of infection. You should quit or significantly reduce the amount of cigarettes you smoke prior to and after procedure.  Pain Killers Everyone reacts differently in respect of pain. For most people circumcision will not be truly painful, but a degree of discomfort is to be expected during the first few days. If you choose to take pain killing tablets like Tylenol then follow the instructions precisely.  Do not take more than the recommended maximum dose. Do not take Aspirin or any Aspirin based product since these thin the blood and have an anti-clotting action which can increase bleeding from a wound.  The Stitches Stitches need to remain in place long enough for the cut edges to knit together but not so long as to allow the skin around them to fully heal. In practice this usually means they should remain for between 1 and 2 weeks. Although the doctor will normally use soluble (or self-dissolving) stitches and will dissolve/ fall out on their own.    Time off School or Work There is no absolute need to take time off school or work after circumcision, but you may find it very hard to concentrate on work for the first few days and so may find it useful to take a week off. A week (or even two) off work is very desirable if you do heavy lifting or if your job keeps you seated and unable to move around freely for long periods. You should naturally avoid energetic or contact sports, sexual activity, cycling and swimming until your circumcision has fully healed.    AMBULATORY SURGERY  DISCHARGE INSTRUCTIONS   The drugs that you were given will stay in your system until tomorrow so for the next 24 hours you should not:  Drive an automobile Make any legal decisions Drink any alcoholic beverage   You may resume regular meals tomorrow.  Today it is better to start with liquids and gradually work up   to solid foods.  You may eat anything you prefer, but it is better to start with liquids, then soup and crackers, and gradually work up to solid foods.   Please notify your doctor immediately if you have any unusual bleeding, trouble breathing, redness and pain at the surgery site, drainage, fever, or pain not relieved by medication.    Additional Instructions:        Please contact your physician with any problems or Same Day Surgery at [phone], Monday through Friday 6 am to 4 pm, or  [facility] number at [phone].  

## 2021-01-17 NOTE — Op Note (Signed)
Date of procedure: 01/17/21  Preoperative diagnosis:  Phimosis  Postoperative diagnosis:  Phimosis  Procedure: Circumcision with local anesthesia block  Surgeon: Hollice Espy, MD  Anesthesia: General  Complications: None  Intraoperative findings: Severely scarred foreskin, difficult to retract.  Excellent postoperative cosmesis.  EBL: Minimal  Specimens: Foreskin  Drains: None  Indication: Evan Rivas is a 70 y.o. patient with phimosis electing circumcision.  After reviewing the management options for treatment, he elected to proceed with the above surgical procedure(s). We have discussed the potential benefits and risks of the procedure, side effects of the proposed treatment, the likelihood of the patient achieving the goals of the procedure, and any potential problems that might occur during the procedure or recuperation. Informed consent has been obtained.  Description of procedure:  The patient was taken to the operating room and general anesthesia was induced.  The patient was placed in the supine position, prepped and draped in the usual sterile fashion, and preoperative antibiotics were administered. A preoperative time-out was performed.   A penile block was performed with 10 cc of 1% lidocaine.  An additional 10 cc was used in a ring block at the base of the penis for local anesthetic.  I marked an area circumferentially around the midshaft where the disease skin was noted to be distal of this.  I bluntly reduce the foreskin which was quite difficult due to the degree of phimosis and clean the glans using additional Betadine solution.  A second circumferential line was drawn approximately 1 cm proximal to the coronal margin.  Each of these lines was then incised and the foreskin was removed in a sleevelike fashion.  Careful hemostasis was achieved.  The frenulum was reapproximated using 4-0 chromic suture x1 in interrupted fashion.  U stitch was then used at the  ventral aspect of the penis and the remainder of the shaft skin was reapproximated using simple interrupted 4-0 chromic suture.  This brought the skin edges together quite nicely cosmesis was excellent.  There is no bleeding noted.  A dressing of Vaseline gauze, conformer and Coban was applied and secured to the suprapubic area using tape.  Additional bacitracin was applied to the glans.  He was then reversed from anesthesia and taken the PACU in stable condition.  Plan: He will keep his dressing for 2 days unless he has difficulty voiding.  We will see him in 1 month for wound check.  Hollice Espy, M.D.

## 2021-01-17 NOTE — Anesthesia Postprocedure Evaluation (Signed)
Anesthesia Post Note  Patient: NUMA SCHROETER  Procedure(s) Performed: CIRCUMCISION ADULT (Penis)  Patient location during evaluation: PACU Anesthesia Type: General Level of consciousness: awake and alert, oriented and patient cooperative Pain management: pain level controlled Vital Signs Assessment: post-procedure vital signs reviewed and stable Respiratory status: spontaneous breathing, nonlabored ventilation and respiratory function stable Cardiovascular status: blood pressure returned to baseline and stable Postop Assessment: adequate PO intake Anesthetic complications: no   No notable events documented.   Last Vitals:  Vitals:   01/17/21 1340 01/17/21 1350  BP: 123/67 140/68  Pulse: 80 71  Resp: 20 18  Temp:  36.9 C  SpO2: 99% 98%    Last Pain:  Vitals:   01/17/21 1350  TempSrc: Oral  PainSc: 2                  Darrin Nipper

## 2021-01-18 ENCOUNTER — Ambulatory Visit: Payer: Medicare PPO

## 2021-01-19 LAB — SURGICAL PATHOLOGY

## 2021-01-27 DIAGNOSIS — M5451 Vertebrogenic low back pain: Secondary | ICD-10-CM | POA: Diagnosis not present

## 2021-02-01 ENCOUNTER — Other Ambulatory Visit: Payer: Self-pay

## 2021-02-01 ENCOUNTER — Ambulatory Visit (INDEPENDENT_AMBULATORY_CARE_PROVIDER_SITE_OTHER): Payer: Medicare PPO | Admitting: Physician Assistant

## 2021-02-01 ENCOUNTER — Encounter: Payer: Self-pay | Admitting: Physician Assistant

## 2021-02-01 VITALS — BP 141/82 | HR 87 | Ht 75.0 in | Wt 259.0 lb

## 2021-02-01 DIAGNOSIS — N481 Balanitis: Secondary | ICD-10-CM | POA: Diagnosis not present

## 2021-02-01 DIAGNOSIS — N471 Phimosis: Secondary | ICD-10-CM

## 2021-02-01 MED ORDER — CLOTRIMAZOLE 1 % EX CREA: 1.0000 "application " | TOPICAL_CREAM | Freq: Two times a day (BID) | CUTANEOUS | 1 refills | Status: DC

## 2021-02-01 NOTE — Progress Notes (Signed)
02/01/2021 10:35 AM   Evan Rivas 09-13-1950 [id number]  CC: Chief Complaint  Patient presents with   Routine Post Op   HPI: Evan Rivas is a 70 y.o. male with PMH BPH with urinary obstruction s/p UroLift in 2021, nephrolithasis, mild bulbar urethral stricture, and phimosis who underwent circumcision with Dr. Erlene Quan on 01/17/2021 who presents today for postop wound check.   Today he reports he has been healing well with no acute concerns. He has noticed some sensitivity in his glans penis following circumcision, however this is already improving.  Surgical pathology negative for high-grade squamous intraepithelial lesion and malignancy.  PMH: Past Medical History:  Diagnosis Date   Adrenal hyperplasia ([place])    Anxiety    panic attacks   Arthritis    Basal cell carcinoma 2015   R upper lip - tx with MOHS by Dr. Lacinda Axon   CKD (chronic kidney disease), stage III ([place])    Depression    GERD (gastroesophageal reflux disease)    Gout    Grade II diastolic dysfunction    a.) TTE 02/19/2018 --> LVEF 60-65%, mild LVH, G2DD, mild MR, mildly dilated LA, RV cavity size/wall thickness/systolic function normal   History of kidney stones    Hyperaldosteronism ([place])    a.) secondary to BILATERAL adrenal hyperplasia   Hyperlipidemia    Hypertension    Nerve entrapment syndrome of lower extremity, right 10/24/2019   Pneumonia    2014 and 2018 ( 2018-klebsiella pneumonia    Sleep apnea    T2DM (type 2 diabetes mellitus) ([place])     Surgical History: Past Surgical History:  Procedure Laterality Date   ARTHROSCOPIC REPAIR ACL Left    CIRCUMCISION N/A 01/17/2021   Procedure: CIRCUMCISION ADULT;  Surgeon: Hollice Espy, MD;  Location: [facility];  Service: Urology;  Laterality: N/A;   CYSTOSCOPY W/ URETERAL STENT PLACEMENT Right 09/07/2014   Procedure: CYSTOSCOPY WITH STENT REPLACEMENT;  Surgeon: Hollice Espy, MD;  Location: [facility];  Service: Urology;  Laterality: Right;    CYSTOSCOPY WITH INSERTION OF UROLIFT N/A 11/24/2019   Procedure: CYSTOSCOPY WITH INSERTION OF UROLIFT;  Surgeon: Hollice Espy, MD;  Location: [facility];  Service: Urology;  Laterality: N/A;   CYSTOSCOPY/URETEROSCOPY/HOLMIUM LASER/STENT PLACEMENT Right 08/17/2014   Procedure: CYSTOSCOPY/URETEROSCOPY//STENT PLACEMENT/ attempt of lithotripsy;  Surgeon: Hollice Espy, MD;  Location: [facility];  Service: Urology;  Laterality: Right;   HERNIA REPAIR     JOINT REPLACEMENT     Bilat knees and right hip   MOHS SURGERY     lip   TOTAL HIP ARTHROPLASTY Right 06/16/2020   Procedure: TOTAL HIP ARTHROPLASTY ANTERIOR APPROACH;  Surgeon: Gaynelle Arabian, MD;  Location: [facility];  Service: Orthopedics;  Laterality: Right;  143mn   TOTAL KNEE ARTHROPLASTY Left 04/24/2016   Procedure: LEFT TOTAL KNEE ARTHROPLASTY;  Surgeon: FGaynelle Arabian MD;  Location: [facility];  Service: Orthopedics;  Laterality: Left;  Adductor Block   TOTAL KNEE ARTHROPLASTY Right 10/23/2016   Procedure: RIGHT TOTAL KNEE ARTHROPLASTY;  Surgeon: AGaynelle Arabian MD;  Location: [facility];  Service: Orthopedics;  Laterality: Right;   URETEROSCOPY WITH HOLMIUM LASER LITHOTRIPSY Right 09/07/2014   Procedure: URETEROSCOPY WITH HOLMIUM LASER LITHOTRIPSY;  Surgeon: AHollice Espy MD;  Location: [facility];  Service: Urology;  Laterality: Right;   VASECTOMY      Home Medications:  Allergies as of 02/01/2021       Reactions   Celebrex [celecoxib]    GI issues   Tetanus Toxoids Other (See Comments)  Unkown   Tetanus-diphtheria Toxoids Td    Unknown        Medication List        Accurate as of February 01, 2021 10:35 AM. If you have any questions, ask your nurse or doctor.          Accu-Chek Aviva Plus test strip Generic drug: glucose blood Use as instructed   True Metrix Blood Glucose Test test strip Generic drug: glucose blood Use as instructed   aspirin EC 81 MG tablet Take 81 mg by mouth daily. Swallow whole.    atorvastatin 40 MG tablet Commonly known as: LIPITOR Take 1 tablet (40 mg total) by mouth at bedtime.   blood glucose meter kit and supplies Dispense based on patient and insurance preference. Use up to four times daily as directed. (FOR ICD-10 E10.9, E11.9).   clotrimazole 1 % cream Commonly known as: LOTRIMIN Apply 1 application topically 2 (two) times daily. Apply for 7-10 days. Started by: Debroah Loop, PA-C   dapagliflozin propanediol 5 MG Tabs tablet Commonly known as: Farxiga Take 1 tablet (5 mg total) by mouth daily before breakfast. What changed: when to take this   DULoxetine 60 MG capsule Commonly known as: CYMBALTA Take 1 capsule (60 mg total) by mouth daily. What changed: when to take this   fluticasone 50 MCG/ACT nasal spray Commonly known as: FLONASE Place 2 sprays into both nostrils daily.   gabapentin 600 MG tablet Commonly known as: Neurontin Take 1 tablet (600 mg total) by mouth 3 (three) times daily.   glipiZIDE 10 MG tablet Commonly known as: GLUCOTROL Take 1 tablet (10 mg total) by mouth daily before breakfast.   HYDROcodone-acetaminophen 10-325 MG tablet Commonly known as: NORCO Take 0.5-1 tablets by mouth every 8 (eight) hours as needed for severe pain. max #54 for the month - gradually decrease dose   HYDROcodone-acetaminophen 10-325 MG tablet Commonly known as: NORCO Take 0.5-1 tablets by mouth 2 (two) times daily as needed for severe pain. Max #48 for the month - gradually decrease dose   HYDROcodone-acetaminophen 5-325 MG tablet Commonly known as: NORCO/VICODIN Take 1-2 tablets by mouth every 6 (six) hours as needed for moderate pain.   hydrOXYzine 25 MG tablet Commonly known as: ATARAX/VISTARIL Take 25 mg by mouth 3 (three) times daily as needed for anxiety.   ketoconazole 2 % cream Commonly known as: NIZORAL Apply to the feet QHS. What changed:  how much to take how to take this when to take this reasons to take  this additional instructions   melatonin 3 MG Tabs tablet Take 3 mg by mouth at bedtime.   metFORMIN 1000 MG tablet Commonly known as: GLUCOPHAGE TAKE 1 TABLET TWICE DAILY WITH MEALS   metoprolol succinate 100 MG 24 hr tablet Commonly known as: TOPROL-XL Take 1 tablet (100 mg total) by mouth daily. Take with or immediately following a meal. What changed: when to take this   multivitamin tablet Take 1 tablet by mouth daily.   spironolactone 25 MG tablet Commonly known as: ALDACTONE Take 1 tablet (25 mg total) by mouth 2 (two) times a week. What changed: additional instructions   tiZANidine 4 MG tablet Commonly known as: ZANAFLEX Take 4 mg by mouth every 6 (six) hours as needed for muscle spasms.   TRUEplus Lancets 37C Misc   Trulicity 1.5 HY/8.5OY Sopn Generic drug: Dulaglutide Inject 1.5 mg into the skin once a week.   valsartan-hydrochlorothiazide 320-12.5 MG tablet Commonly known as: DIOVAN-HCT Take 1 tablet  by mouth daily. What changed: when to take this        Allergies:  Allergies  Allergen Reactions   Celebrex [Celecoxib]     GI issues   Tetanus Toxoids Other (See Comments)    Unkown   Tetanus-Diphtheria Toxoids Td     Unknown    Family History: Family History  Problem Relation Age of Onset   Hypertension Mother    Cancer Mother    Hyperlipidemia Mother    Arthritis Mother    Stroke Father    Heart disease Father    Obesity Father    Depression Sister    Hypertension Sister    Hyperlipidemia Sister    Learning disabilities Sister    Obesity Sister    Kidney disease Maternal Grandmother     Social History:   reports that he has never smoked. He has never used smokeless tobacco. He reports that he does not drink alcohol and does not use drugs.  Physical Exam: BP (!) 141/82   Pulse 87   Ht '6\' 3"'  (1.905 m)   Wt 259 lb (117.5 kg)   BMI 32.37 kg/m   Constitutional:  Alert and oriented, no acute distress, nontoxic appearing HEENT: [place],  AT Cardiovascular: No clubbing, cyanosis, or edema Respiratory: Normal respiratory effort, no increased work of breathing GU: Well healing circumcision; some dissolvable sutures still in place. Erythematous glans penis with cheesy white discharge. Skin: No rashes, bruises or suspicious lesions Neurologic: Grossly intact, no focal deficits, moving all 4 extremities Psychiatric: Normal mood and affect  Assessment & Plan:   1. Phimosis Well healing. We discussed that postop glans sensitivity is normal and will continue to improve. Shared negative pathology with patient.  2. Balanitis Noted on physical exam today, will treat with empiric clotrimazole cream. - clotrimazole (LOTRIMIN) 1 % cream; Apply 1 application topically 2 (two) times daily. Apply for 7-10 days.  Dispense: 28 g; Refill: 1  Return if symptoms worsen or fail to improve.  Debroah Loop, PA-C  [facility] [address] [place], [place] [address] [phone]

## 2021-02-09 ENCOUNTER — Other Ambulatory Visit: Payer: Self-pay | Admitting: Unknown Physician Specialty

## 2021-02-09 NOTE — Telephone Encounter (Signed)
Requested Prescriptions  Pending Prescriptions Disp Refills  . glipiZIDE (GLUCOTROL) 10 MG tablet [Pharmacy Med Name: GLIPIZIDE 10 MG Tablet] 90 tablet 0    Sig: TAKE 1 TABLET EVERY DAY BEFORE BREAKFAST     Endocrinology:  Diabetes - Sulfonylureas Passed - 02/09/2021  3:16 AM      Passed - HBA1C is between 0 and 7.9 and within 180 days    Hemoglobin A1C  Date Value Ref Range Status  09/21/2020 7.6 (A) 4.0 - 5.6 % Final   HbA1c, POC (prediabetic range)  Date Value Ref Range Status  03/25/2018 8.9 (A) 5.7 - 6.4 % Final   HbA1c, POC (controlled diabetic range)  Date Value Ref Range Status  03/25/2018 8.9 (A) 0.0 - 7.0 % Final   HbA1c POC (<> result, manual entry)  Date Value Ref Range Status  03/25/2018 8.9 4.0 - 5.6 % Final   Hgb A1c MFr Bld  Date Value Ref Range Status  06/15/2020 7.4 (H) 4.8 - 5.6 % Final    Comment:    (NOTE) Pre diabetes:          5.7%-6.4%  Diabetes:              >6.4%  Glycemic control for   <7.0% adults with diabetes          Passed - Valid encounter within last 6 months    Recent Outpatient Visits          3 months ago Major depressive disorder, recurrent episode, moderate ([place])   [facility] Delsa Grana, PA-C   3 months ago Weakness   [facility] Chase, [place], NP   4 months ago Type 2 diabetes mellitus without complication, without long-term current use of insulin [facility])   [facility] Kathrine Haddock, NP   5 months ago Continuous opioid dependence [facility])   [facility] Kathrine Haddock, NP   6 months ago Continuous opioid dependence [facility])   [facility] Just, Laurita Quint, FNP      Future Appointments            Tomorrow Kathrine Haddock, NP [facility], [place]   In 10 months Hollice Espy, MD [place]

## 2021-02-10 ENCOUNTER — Ambulatory Visit: Payer: Medicare PPO | Admitting: Unknown Physician Specialty

## 2021-02-10 ENCOUNTER — Other Ambulatory Visit: Payer: Self-pay

## 2021-02-10 ENCOUNTER — Encounter: Payer: Self-pay | Admitting: Unknown Physician Specialty

## 2021-02-10 VITALS — BP 130/74 | HR 98 | Temp 98.1°F | Resp 16 | Ht 75.0 in | Wt 262.0 lb

## 2021-02-10 DIAGNOSIS — E1169 Type 2 diabetes mellitus with other specified complication: Secondary | ICD-10-CM

## 2021-02-10 DIAGNOSIS — E669 Obesity, unspecified: Secondary | ICD-10-CM | POA: Diagnosis not present

## 2021-02-10 DIAGNOSIS — F1120 Opioid dependence, uncomplicated: Secondary | ICD-10-CM

## 2021-02-10 DIAGNOSIS — I10 Essential (primary) hypertension: Secondary | ICD-10-CM | POA: Diagnosis not present

## 2021-02-10 DIAGNOSIS — Z23 Encounter for immunization: Secondary | ICD-10-CM

## 2021-02-10 LAB — POCT GLYCOSYLATED HEMOGLOBIN (HGB A1C): Hemoglobin A1C: 8.9 % — AB (ref 4.0–5.6)

## 2021-02-10 MED ORDER — DAPAGLIFLOZIN PROPANEDIOL 10 MG PO TABS: 10.0000 mg | ORAL_TABLET | Freq: Every day | ORAL | 1 refills | Status: DC

## 2021-02-10 MED ORDER — TIZANIDINE HCL 4 MG PO TABS: 4.0000 mg | ORAL_TABLET | Freq: Four times a day (QID) | ORAL | 1 refills | Status: DC | PRN

## 2021-02-10 MED ORDER — HYDROCODONE-ACETAMINOPHEN 10-325 MG PO TABS: 0.5000 | ORAL_TABLET | Freq: Two times a day (BID) | ORAL | 0 refills | Status: DC | PRN

## 2021-02-10 MED ORDER — HYDROCODONE-ACETAMINOPHEN 10-325 MG PO TABS: 0.5000 | ORAL_TABLET | Freq: Three times a day (TID) | ORAL | 0 refills | Status: DC | PRN

## 2021-02-10 NOTE — Progress Notes (Signed)
BP 130/74   Pulse 98   Temp 98.1 F (36.7 C) (Oral)   Resp 16   Ht 6\' 3"  (1.905 m)   Wt 262 lb (118.8 kg)   SpO2 96%   BMI 32.75 kg/m    Subjective:    Patient ID: Evan Rivas, male    DOB: 1951/02/27, 70 y.o.   MRN: [id number]  HPI: Evan Rivas is a 70 y.o. male  Chief Complaint  Patient presents with   Follow-up   Hypertension   Diabetes   Back Pain   Diabetes: Using medications without difficulties.  However admits that he has been sedentary with back problems following long distance driving due to deaths in family.  Taking Trulicity and Metformin No hypoglycemic episodes No hyperglycemic episodes Feet problems:none  Blood Sugars averaging: they were in the 200's but now coming down.   eye exam within last year Last Hgb A1C:7.6 Note GFR of 53  Hypertension  Using medications without difficulty Average home BPs: 120/80   Using medication without problems or lightheadedness No chest pain with exertion or shortness of breath No Edema  Elevated Cholesterol Using medications without problems No Muscle aches  Diet: Exercise: none   Opioid taper Pt is here for prescriptions to continue his opioid taper of Norco.  Last saw Delsa Grana 3 months ago and given monthly prescriptions for continued taper 58,54,48 over a 3 month period.  He did receive an additional 10 pills with urologic surgery.  He is restarting Tizanidine and needs a script.  Feeling not ready to taper further today due to recent flare.  Planning on PT who can't see him until later.  Opioids help him with ADLs.    Depression screen [facility] 2/9 02/10/2021 11/11/2020 10/19/2020 09/21/2020 08/24/2020  Decreased Interest 1 1 0 0 0  Down, Depressed, Hopeless 1 1 1  0 0  PHQ - 2 Score 2 2 1  0 0  Altered sleeping 0 3 0 - 0  Tired, decreased energy 0 2 0 - 0  Change in appetite 0 0 0 - 0  Feeling bad or failure about yourself  0 0 0 - 0  Trouble concentrating 0 0 0 - 0  Moving slowly or  fidgety/restless 0 1 0 - 0  Suicidal thoughts 0 0 0 - 0  PHQ-9 Score 2 8 1  - 0  Difficult doing work/chores Not difficult at all Somewhat difficult Not difficult at all - Not difficult at all  Some recent data might be hidden     Relevant past medical, surgical, family and social history reviewed and updated as indicated. Interim medical history since our last visit reviewed. Allergies and medications reviewed and updated.  Review of Systems  Per HPI unless specifically indicated above     Objective:    BP 130/74   Pulse 98   Temp 98.1 F (36.7 C) (Oral)   Resp 16   Ht 6\' 3"  (1.905 m)   Wt 262 lb (118.8 kg)   SpO2 96%   BMI 32.75 kg/m   Wt Readings from Last 3 Encounters:  02/10/21 262 lb (118.8 kg)  02/01/21 259 lb (117.5 kg)  01/17/21 262 lb (118.8 kg)    Physical Exam Constitutional:      General: He is not in acute distress.    Appearance: Normal appearance. He is well-developed.  HENT:     Head: Normocephalic and atraumatic.  Eyes:     General: Lids are normal. No scleral icterus.  Right eye: No discharge.        Left eye: No discharge.     Conjunctiva/sclera: Conjunctivae normal.  Neck:     Vascular: No carotid bruit or JVD.  Cardiovascular:     Rate and Rhythm: Normal rate and regular rhythm.     Heart sounds: Normal heart sounds.  Pulmonary:     Effort: Pulmonary effort is normal. No respiratory distress.     Breath sounds: Normal breath sounds.  Abdominal:     Palpations: There is no hepatomegaly or splenomegaly.  Musculoskeletal:        General: Normal range of motion.     Cervical back: Normal range of motion and neck supple.  Skin:    General: Skin is warm and dry.     Coloration: Skin is not pale.     Findings: No rash.  Neurological:     Mental Status: He is alert and oriented to person, place, and time.  Psychiatric:        Behavior: Behavior normal.        Thought Content: Thought content normal.        Judgment: Judgment normal.     Results for orders placed or performed in visit on 02/10/21  POCT HgB A1C  Result Value Ref Range   Hemoglobin A1C 8.9 (A) 4.0 - 5.6 %   HbA1c POC (<> result, manual entry)     HbA1c, POC (prediabetic range)     HbA1c, POC (controlled diabetic range)        Assessment & Plan:   Problem List Items Addressed This Visit       Unprioritized   Continuous opioid dependence ([place])    Due to back flare, will hold taper to 48 for 2 months while working with PT.  2 rx's written for #48 Norco for November and December.        Diabetes mellitus type 2 in obese ([facility])    Hgb A1C 8.9.  Increase Farxiga to 10 mg.  Note sent to pharmacist as has to go through [facility].        Relevant Medications   dapagliflozin propanediol (FARXIGA) 10 MG TABS tablet   Other Relevant Orders   POCT HgB A1C (Completed)   Essential hypertension    Stable, continue present medications.        Other Visit Diagnoses     Need for influenza vaccination    -  Primary   Relevant Orders   Flu Vaccine QUAD High Dose(Fluad) (Completed)        Follow up plan: Return in about 2 months (around 04/12/2021).

## 2021-02-10 NOTE — Assessment & Plan Note (Signed)
Stable, continue present medications.   

## 2021-02-10 NOTE — Assessment & Plan Note (Signed)
Due to back flare, will hold taper to 48 for 2 months while working with PT.  2 rx's written for #48 Norco for November and December.

## 2021-02-10 NOTE — Assessment & Plan Note (Signed)
Hgb A1C 8.9.  Increase Farxiga to 10 mg.  Note sent to pharmacist as has to go through [facility].

## 2021-02-11 ENCOUNTER — Ambulatory Visit: Payer: Medicare PPO | Admitting: Family Medicine

## 2021-02-15 ENCOUNTER — Ambulatory Visit: Payer: Self-pay

## 2021-02-15 DIAGNOSIS — E1169 Type 2 diabetes mellitus with other specified complication: Secondary | ICD-10-CM

## 2021-02-15 MED ORDER — DAPAGLIFLOZIN PROPANEDIOL 10 MG PO TABS: 10.0000 mg | ORAL_TABLET | Freq: Every day | ORAL | 3 refills | Status: DC

## 2021-02-15 NOTE — Progress Notes (Signed)
Updated dose of Farxiga sent into [facility] for delivery via [place] Patient assistance program  Doristine Section, Para March, [place] Pharmacist Practitioner  [facility] [phone]

## 2021-02-17 DIAGNOSIS — R293 Abnormal posture: Secondary | ICD-10-CM | POA: Diagnosis not present

## 2021-02-17 DIAGNOSIS — M5459 Other low back pain: Secondary | ICD-10-CM | POA: Diagnosis not present

## 2021-03-07 DIAGNOSIS — M5459 Other low back pain: Secondary | ICD-10-CM | POA: Diagnosis not present

## 2021-03-07 DIAGNOSIS — R293 Abnormal posture: Secondary | ICD-10-CM | POA: Diagnosis not present

## 2021-03-10 ENCOUNTER — Ambulatory Visit (INDEPENDENT_AMBULATORY_CARE_PROVIDER_SITE_OTHER): Payer: Medicare PPO

## 2021-03-10 DIAGNOSIS — Z0000 Encounter for general adult medical examination without abnormal findings: Secondary | ICD-10-CM

## 2021-03-10 NOTE — Patient Instructions (Signed)
Evan Rivas , Thank you for taking time to come for your Medicare Wellness Visit. I appreciate your ongoing commitment to your health goals. Please review the following plan we discussed and let me know if I can assist you in the future.   Screening recommendations/referrals: Colonoscopy: done 2013; repeat 2023 Recommended yearly ophthalmology/optometry visit for glaucoma screening and checkup Recommended yearly dental visit for hygiene and checkup  Vaccinations: Influenza vaccine: done 02/10/21  Pneumococcal vaccine: done 09/17/17 Tdap vaccine: n/a due to allergy Shingles vaccine: Shingrix discussed. Please contact your pharmacy for coverage information.  Covid-19: done 06/05/19, 06/26/19 & 03/03/20  Advanced directives: Advance directive discussed with you today. Even though you declined this today please call our office should you change your mind and we can give you the proper paperwork for you to fill out.   Conditions/risks identified: Keep up the great work!  Next appointment: Follow up in one year for your annual wellness visit.   Preventive Care 70 Years and Older, Male Preventive care refers to lifestyle choices and visits with your health care provider that can promote health and wellness. What does preventive care include? A yearly physical exam. This is also called an annual well check. Dental exams once or twice a year. Routine eye exams. Ask your health care provider how often you should have your eyes checked. Personal lifestyle choices, including: Daily care of your teeth and gums. Regular physical activity. Eating a healthy diet. Avoiding tobacco and drug use. Limiting alcohol use. Practicing safe sex. Taking low doses of aspirin every day. Taking vitamin and mineral supplements as recommended by your health care provider. What happens during an annual well check? The services and screenings done by your health care provider during your annual well check will depend  on your age, overall health, lifestyle risk factors, and family history of disease. Counseling  Your health care provider may ask you questions about your: Alcohol use. Tobacco use. Drug use. Emotional well-being. Home and relationship well-being. Sexual activity. Eating habits. History of falls. Memory and ability to understand (cognition). Work and work Statistician. Screening  You may have the following tests or measurements: Height, weight, and BMI. Blood pressure. Lipid and cholesterol levels. These may be checked every 5 years, or more frequently if you are over 37 years old. Skin check. Lung cancer screening. You may have this screening every year starting at age 25 if you have a 30-pack-year history of smoking and currently smoke or have quit within the past 15 years. Fecal occult blood test (FOBT) of the stool. You may have this test every year starting at age 24. Flexible sigmoidoscopy or colonoscopy. You may have a sigmoidoscopy every 5 years or a colonoscopy every 10 years starting at age 14. Prostate cancer screening. Recommendations will vary depending on your family history and other risks. Hepatitis C blood test. Hepatitis B blood test. Sexually transmitted disease (STD) testing. Diabetes screening. This is done by checking your blood sugar (glucose) after you have not eaten for a while (fasting). You may have this done every 1-3 years. Abdominal aortic aneurysm (AAA) screening. You may need this if you are a current or former smoker. Osteoporosis. You may be screened starting at age 11 if you are at high risk. Talk with your health care provider about your test results, treatment options, and if necessary, the need for more tests. Vaccines  Your health care provider may recommend certain vaccines, such as: Influenza vaccine. This is recommended every year. Tetanus, diphtheria, and  acellular pertussis (Tdap, Td) vaccine. You may need a Td booster every 10  years. Zoster vaccine. You may need this after age 30. Pneumococcal 13-valent conjugate (PCV13) vaccine. One dose is recommended after age 82. Pneumococcal polysaccharide (PPSV23) vaccine. One dose is recommended after age 45. Talk to your health care provider about which screenings and vaccines you need and how often you need them. This information is not intended to replace advice given to you by your health care provider. Make sure you discuss any questions you have with your health care provider. Document Released: 04/16/2015 Document Revised: 12/08/2015 Document Reviewed: 01/19/2015 Elsevier Interactive Patient Education  2017 [employer] Prevention in the Home Falls can cause injuries. They can happen to people of all ages. There are many things you can do to make your home safe and to help prevent falls. What can I do on the outside of my home? Regularly fix the edges of walkways and driveways and fix any cracks. Remove anything that might make you trip as you walk through a door, such as a raised step or threshold. Trim any bushes or trees on the path to your home. Use bright outdoor lighting. Clear any walking paths of anything that might make someone trip, such as rocks or tools. Regularly check to see if handrails are loose or broken. Make sure that both sides of any steps have handrails. Any raised decks and porches should have guardrails on the edges. Have any leaves, snow, or ice cleared regularly. Use sand or salt on walking paths during winter. Clean up any spills in your garage right away. This includes oil or grease spills. What can I do in the bathroom? Use night lights. Install grab bars by the toilet and in the tub and shower. Do not use towel bars as grab bars. Use non-skid mats or decals in the tub or shower. If you need to sit down in the shower, use a plastic, non-slip stool. Keep the floor dry. Clean up any water that spills on the floor as soon as it  happens. Remove soap buildup in the tub or shower regularly. Attach bath mats securely with double-sided non-slip rug tape. Do not have throw rugs and other things on the floor that can make you trip. What can I do in the bedroom? Use night lights. Make sure that you have a light by your bed that is easy to reach. Do not use any sheets or blankets that are too big for your bed. They should not hang down onto the floor. Have a firm chair that has side arms. You can use this for support while you get dressed. Do not have throw rugs and other things on the floor that can make you trip. What can I do in the kitchen? Clean up any spills right away. Avoid walking on wet floors. Keep items that you use a lot in easy-to-reach places. If you need to reach something above you, use a strong step stool that has a grab bar. Keep electrical cords out of the way. Do not use floor polish or wax that makes floors slippery. If you must use wax, use non-skid floor wax. Do not have throw rugs and other things on the floor that can make you trip. What can I do with my stairs? Do not leave any items on the stairs. Make sure that there are handrails on both sides of the stairs and use them. Fix handrails that are broken or loose. Make sure that  handrails are as long as the stairways. Check any carpeting to make sure that it is firmly attached to the stairs. Fix any carpet that is loose or worn. Avoid having throw rugs at the top or bottom of the stairs. If you do have throw rugs, attach them to the floor with carpet tape. Make sure that you have a light switch at the top of the stairs and the bottom of the stairs. If you do not have them, ask someone to add them for you. What else can I do to help prevent falls? Wear shoes that: Do not have high heels. Have rubber bottoms. Are comfortable and fit you well. Are closed at the toe. Do not wear sandals. If you use a stepladder: Make sure that it is fully opened.  Do not climb a closed stepladder. Make sure that both sides of the stepladder are locked into place. Ask someone to hold it for you, if possible. Clearly mark and make sure that you can see: Any grab bars or handrails. First and last steps. Where the edge of each step is. Use tools that help you move around (mobility aids) if they are needed. These include: Canes. Walkers. Scooters. Crutches. Turn on the lights when you go into a dark area. Replace any light bulbs as soon as they burn out. Set up your furniture so you have a clear path. Avoid moving your furniture around. If any of your floors are uneven, fix them. If there are any pets around you, be aware of where they are. Review your medicines with your doctor. Some medicines can make you feel dizzy. This can increase your chance of falling. Ask your doctor what other things that you can do to help prevent falls. This information is not intended to replace advice given to you by your health care provider. Make sure you discuss any questions you have with your health care provider. Document Released: 01/14/2009 Document Revised: 08/26/2015 Document Reviewed: 04/24/2014 Elsevier Interactive Patient Education  2017 [employer].

## 2021-03-10 NOTE — Progress Notes (Signed)
Subjective:   Evan Rivas is a 70 y.o. male who presents for Medicare Annual/Subsequent preventive examination.  Virtual Visit via Telephone Note  I connected with  Evan Rivas on 03/10/21 at  8:00 AM EST by telephone and verified that I am speaking with the correct person using two identifiers.  Location: Patient: home Provider: [place] Persons participating in the virtual visit: [place]   I discussed the limitations, risks, security and privacy concerns of performing an evaluation and management service by telephone and the availability of in person appointments. The patient expressed understanding and agreed to proceed.  Interactive audio and video telecommunications were attempted between this nurse and patient, however failed, due to patient having technical difficulties OR patient did not have access to video capability.  We continued and completed visit with audio only.  Some vital signs may be absent or patient reported.   Clemetine Marker, LPN   Review of Systems     Cardiac Risk Factors include: advanced age (>28mn, >>37women);dyslipidemia;male gender;hypertension;diabetes mellitus;obesity (BMI >30kg/m2)     Objective:    There were no vitals filed for this visit. There is no height or weight on file to calculate BMI.  Advanced Directives 03/10/2021 01/17/2021 01/11/2021 06/16/2020 06/15/2020 01/15/2020 11/24/2019  Does Patient Have a Medical Advance Directive? No No No No No No No  Does patient want to make changes to medical advance directive? - - - - - - -  Copy of [place]? - - - - - - -  Would patient like information on creating a medical advance directive? No - Patient declined No - Patient declined No - Patient declined No - Patient declined - No - Patient declined No - Patient declined    Current Medications (verified) Outpatient Encounter Medications as of 03/10/2021  Medication Sig   aspirin EC 81 MG  tablet Take 81 mg by mouth daily. Swallow whole.   atorvastatin (LIPITOR) 40 MG tablet Take 1 tablet (40 mg total) by mouth at bedtime.   blood glucose meter kit and supplies Dispense based on patient and insurance preference. Use up to four times daily as directed. (FOR ICD-10 E10.9, E11.9).   clotrimazole (LOTRIMIN) 1 % cream Apply 1 application topically 2 (two) times daily. Apply for 7-10 days.   dapagliflozin propanediol (FARXIGA) 10 MG TABS tablet Take 1 tablet (10 mg total) by mouth daily before breakfast. Patient receives via [place] Patient Assistance Program   Dulaglutide (TRULICITY) 1.5 MHT/9.7FSSOPN Inject 1.5 mg into the skin once a week.   DULoxetine (CYMBALTA) 60 MG capsule Take 1 capsule (60 mg total) by mouth daily. (Patient taking differently: Take 60 mg by mouth at bedtime.)   fluticasone (FLONASE) 50 MCG/ACT nasal spray Place 2 sprays into both nostrils daily.   gabapentin (NEURONTIN) 600 MG tablet Take 1 tablet (600 mg total) by mouth 3 (three) times daily.   glipiZIDE (GLUCOTROL) 10 MG tablet TAKE 1 TABLET EVERY DAY BEFORE BREAKFAST   glucose blood (ACCU-CHEK AVIVA PLUS) test strip Use as instructed   HYDROcodone-acetaminophen (NORCO) 10-325 MG tablet Take 0.5-1 tablets by mouth 2 (two) times daily as needed for severe pain. Max #48 for the month - gradually decrease dose   hydrOXYzine (ATARAX/VISTARIL) 25 MG tablet Take 25 mg by mouth 3 (three) times daily as needed for anxiety.    ketoconazole (NIZORAL) 2 % cream Apply to the feet QHS. (Patient taking differently: Apply 1 application topically at bedtime as needed  for irritation.)   melatonin 3 MG TABS tablet Take 3 mg by mouth at bedtime.   metFORMIN (GLUCOPHAGE) 1000 MG tablet TAKE 1 TABLET TWICE DAILY WITH MEALS   metoprolol succinate (TOPROL-XL) 100 MG 24 hr tablet Take 1 tablet (100 mg total) by mouth daily. Take with or immediately following a meal. (Patient taking differently: Take 100 mg by mouth at bedtime. Take with or  immediately following a meal.)   Multiple Vitamin (MULTIVITAMIN) tablet Take 1 tablet by mouth daily.   spironolactone (ALDACTONE) 25 MG tablet Take 1 tablet (25 mg total) by mouth 2 (two) times a week. (Patient taking differently: Take 25 mg by mouth 2 (two) times a week. On Wednesdays and Saturdays only.)   tiZANidine (ZANAFLEX) 4 MG tablet Take 1 tablet (4 mg total) by mouth every 6 (six) hours as needed for muscle spasms.   TRUEplus Lancets 33G MISC    valsartan-hydrochlorothiazide (DIOVAN-HCT) 320-12.5 MG tablet Take 1 tablet by mouth daily. (Patient taking differently: Take 1 tablet by mouth at bedtime.)   [START ON 03/14/2021] HYDROcodone-acetaminophen (NORCO) 10-325 MG tablet Take 0.5-1 tablets by mouth every 8 (eight) hours as needed for severe pain. max #54 for the month - gradually decrease dose   [DISCONTINUED] glucose blood (TRUE METRIX BLOOD GLUCOSE TEST) test strip Use as instructed   No facility-administered encounter medications on file as of 03/10/2021.    Allergies (verified) Celebrex [celecoxib], Tetanus toxoids, and Tetanus-diphtheria toxoids td   History: Past Medical History:  Diagnosis Date   Adrenal hyperplasia ([place])    Anxiety    panic attacks   Arthritis    Basal cell carcinoma 2015   R upper lip - tx with MOHS by Dr. Lacinda Axon   CKD (chronic kidney disease), stage III ([place])    Depression    GERD (gastroesophageal reflux disease)    Gout    Grade II diastolic dysfunction    a.) TTE 02/19/2018 --> LVEF 60-65%, mild LVH, G2DD, mild MR, mildly dilated LA, RV cavity size/wall thickness/systolic function normal   History of kidney stones    Hyperaldosteronism ([place])    a.) secondary to BILATERAL adrenal hyperplasia   Hyperlipidemia    Hypertension    Nerve entrapment syndrome of lower extremity, right 10/24/2019   Pneumonia    2014 and 2018 ( 2018-klebsiella pneumonia    Sleep apnea    T2DM (type 2 diabetes mellitus) ([place])    Past Surgical History:  Procedure  Laterality Date   ARTHROSCOPIC REPAIR ACL Left    CIRCUMCISION N/A 01/17/2021   Procedure: CIRCUMCISION ADULT;  Surgeon: Hollice Espy, MD;  Location: [facility];  Service: Urology;  Laterality: N/A;   CYSTOSCOPY W/ URETERAL STENT PLACEMENT Right 09/07/2014   Procedure: CYSTOSCOPY WITH STENT REPLACEMENT;  Surgeon: Hollice Espy, MD;  Location: [facility];  Service: Urology;  Laterality: Right;   CYSTOSCOPY WITH INSERTION OF UROLIFT N/A 11/24/2019   Procedure: CYSTOSCOPY WITH INSERTION OF UROLIFT;  Surgeon: Hollice Espy, MD;  Location: [facility];  Service: Urology;  Laterality: N/A;   CYSTOSCOPY/URETEROSCOPY/HOLMIUM LASER/STENT PLACEMENT Right 08/17/2014   Procedure: CYSTOSCOPY/URETEROSCOPY//STENT PLACEMENT/ attempt of lithotripsy;  Surgeon: Hollice Espy, MD;  Location: [facility];  Service: Urology;  Laterality: Right;   HERNIA REPAIR     JOINT REPLACEMENT     Bilat knees and right hip   MOHS SURGERY     lip   TOTAL HIP ARTHROPLASTY Right 06/16/2020   Procedure: TOTAL HIP ARTHROPLASTY ANTERIOR APPROACH;  Surgeon: Gaynelle Arabian, MD;  Location: [facility];  Service: Orthopedics;  Laterality: Right;  129mn   TOTAL KNEE ARTHROPLASTY Left 04/24/2016   Procedure: LEFT TOTAL KNEE ARTHROPLASTY;  Surgeon: FGaynelle Arabian MD;  Location: [facility];  Service: Orthopedics;  Laterality: Left;  Adductor Block   TOTAL KNEE ARTHROPLASTY Right 10/23/2016   Procedure: RIGHT TOTAL KNEE ARTHROPLASTY;  Surgeon: AGaynelle Arabian MD;  Location: [facility];  Service: Orthopedics;  Laterality: Right;   URETEROSCOPY WITH HOLMIUM LASER LITHOTRIPSY Right 09/07/2014   Procedure: URETEROSCOPY WITH HOLMIUM LASER LITHOTRIPSY;  Surgeon: AHollice Espy MD;  Location: [facility];  Service: Urology;  Laterality: Right;   VASECTOMY     Family History  Problem Relation Age of Onset   Hypertension Mother    Cancer Mother    Hyperlipidemia Mother    Arthritis Mother    Stroke Father    Heart disease Father    Obesity Father     Depression Sister    Hypertension Sister    Hyperlipidemia Sister    Learning disabilities Sister    Obesity Sister    Kidney disease Maternal Grandmother    Social History   Socioeconomic History   Marital status: Married    Spouse name: CHoyle Sauer  Number of children: 1   Years of education: Not on file   Highest education level: Not on file  Occupational History   Occupation: retired  Tobacco Use   Smoking status: Never   Smokeless tobacco: Never  Vaping Use   Vaping Use: Never used  Substance and Sexual Activity   Alcohol use: No   Drug use: No   Sexual activity: Not Currently    Birth control/protection: None  Other Topics Concern   Not on file  Social History Narrative   Not on file   Social Determinants of Health   Financial Resource Strain: Low Risk    Difficulty of Paying Living Expenses: Not hard at all  Food Insecurity: No Food Insecurity   Worried About RCharity fundraiserin the Last Year: Never true   [place] the Last Year: Never true  Transportation Needs: No Transportation Needs   Lack of Transportation (Medical): No   Lack of Transportation (Non-Medical): No  Physical Activity: Sufficiently Active   Days of Exercise per Week: 3 days   Minutes of Exercise per Session: 60 min  Stress: No Stress Concern Present   Feeling of Stress : Not at all  Social Connections: Socially Integrated   Frequency of Communication with Friends and Family: More than three times a week   Frequency of Social Gatherings with Friends and Family: More than three times a week   Attends Religious Services: More than 4 times per year   Active Member of [employer] Organizations: Yes   Attends CArchivistMeetings: 1 to 4 times per year   Marital Status: Married    Tobacco Counseling Counseling given: Not Answered   Clinical Intake:  Pre-visit preparation completed: Yes  Pain : No/denies pain     Nutritional Risks: None Diabetes: Yes CBG done?:  No Did pt. bring in CBG monitor from home?: No  How often do you need to have someone help you when you read instructions, pamphlets, or other written materials from your doctor or pharmacy?: 1 - Never  Nutrition Risk Assessment:  Has the patient had any N/V/D within the last 2 months?  No  Does the patient have any non-healing wounds?  No  Has the patient had any unintentional weight  loss or weight gain?  No   Diabetes:  Is the patient diabetic?  Yes  If diabetic, was a CBG obtained today?  No  Did the patient bring in their glucometer from home?  No  How often do you monitor your CBG's? daily.   Financial Strains and Diabetes Management:  Are you having any financial strains with the device, your supplies or your medication? No .  Does the patient want to be seen by Chronic Care Management for management of their diabetes?  No  Would the patient like to be referred to a Nutritionist or for Diabetic Management?  No   Diabetic Exams:  Diabetic Eye Exam: Overdue for diabetic eye exam. Pt has been advised about the importance in completing this exam. Scheduled for Jan 2023 with Dr. Gloriann Loan  Diabetic Foot Exam: Completed 06/14/20.   Interpreter Needed?: No  Information entered by :: Clemetine Marker LPN   Activities of Daily Living In your present state of health, do you have any difficulty performing the following activities: 03/10/2021 02/10/2021  Hearing? N N  Vision? N N  Difficulty concentrating or making decisions? N N  Walking or climbing stairs? N N  Dressing or bathing? N N  Doing errands, shopping? N N  Preparing Food and eating ? N -  Using the Toilet? N -  In the past six months, have you accidently leaked urine? N -  Do you have problems with loss of bowel control? N -  Managing your Medications? N -  Managing your Finances? N -  Housekeeping or managing your Housekeeping? N -  Some recent data might be hidden    Patient Care Team: [place]  as PCP - General (Family Medicine) Germaine Pomfret, [facility] (Pharmacist) Murlean Iba, MD as Consulting Physician (Nephrology) End, Harrell Gave, MD as Consulting Physician (Cardiology) Hollice Espy, MD as Consulting Physician (Urology) Ralene Bathe, MD (Dermatology)  Indicate any recent Medical Services you may have received from other than [facility] providers in the past year (date may be approximate).     Assessment:   This is a routine wellness examination for Jeanette.  Hearing/Vision screen Hearing Screening - Comments:: Pt denies hearing difficulty Vision Screening - Comments:: Annual vision screenings with Dr. Gloriann Loan  Dietary issues and exercise activities discussed: Current Exercise Habits: Home exercise routine, Type of exercise: walking;strength training/weights;calisthenics;stretching, Time (Minutes): 60, Frequency (Times/Week): 3, Weekly Exercise (Minutes/Week): 180, Intensity: Moderate, Exercise limited by: orthopedic condition(s)   Goals Addressed             This Visit's Progress    Increase physical activity   On track    Recommend increasing physical activity to at least 3 days per week       Depression Screen PHQ 2/9 Scores 03/10/2021 02/10/2021 11/11/2020 10/19/2020 09/21/2020 08/24/2020 07/22/2020  PHQ - 2 Score '2 2 2 1 ' 0 0 0  PHQ- 9 Score '3 2 8 1 ' - 0 0    Fall Risk Fall Risk  03/10/2021 02/10/2021 11/11/2020 10/19/2020 09/21/2020  Falls in the past year? 0 0 0 0 0  Number falls in past yr: 0 0 0 0 0  Injury with Fall? 0 0 0 0 0  Risk for fall due to : No Fall Risks No Fall Risks - - -  Follow up Falls prevention discussed Falls prevention discussed - Falls evaluation completed Falls evaluation completed    [place]:  Any stairs in or around the  home? No  If so, are there any without handrails? No  Home free of loose throw rugs in walkways, pet beds, electrical cords, etc? Yes  Adequate lighting in your home to reduce  risk of falls? Yes   ASSISTIVE DEVICES UTILIZED TO PREVENT FALLS:  Life alert? No  Use of a cane, walker or w/c? No  Grab bars in the bathroom? Yes  Shower chair or bench in shower? Yes  Elevated toilet seat or a handicapped toilet? Yes   TIMED UP AND GO:  Was the test performed? No . Telephonic visit   Cognitive Function: Normal cognitive status assessed by direct observation by this Nurse Health Advisor. No abnormalities found.       6CIT Screen 09/24/2018  What Year? 0 points  What month? 0 points  What time? 0 points  Count back from 20 0 points  Months in reverse 0 points  Repeat phrase 0 points  Total Score 0    Immunizations Immunization History  Administered Date(s) Administered   Fluad Quad(high Dose 65+) 11/20/2018, 12/15/2019, 02/10/2021   Influenza, High Dose Seasonal PF 12/22/2015, 01/22/2017, 12/18/2017   Influenza, Seasonal, Injecte, Preservative Fre 12/18/2011   Influenza,inj,Quad PF,6+ Mos 01/06/2013, 12/05/2013, 01/06/2015   Influenza-Unspecified 12/02/2013   PFIZER(Purple Top)SARS-COV-2 Vaccination 06/05/2019, 06/26/2019, 03/03/2020   Pneumococcal Conjugate-13 09/17/2017   Pneumococcal Polysaccharide-23 07/20/2016   Tdap 06/05/2013    TDAP status: Up to date  Flu Vaccine status: Up to date  Pneumococcal vaccine status: Up to date  Covid-19 vaccine status: Completed vaccines  Qualifies for Shingles Vaccine? Yes   Zostavax completed No   Shingrix Completed?: No.    Education has been provided regarding the importance of this vaccine. Patient has been advised to call insurance company to determine out of pocket expense if they have not yet received this vaccine. Advised may also receive vaccine at local pharmacy or Health Dept. Verbalized acceptance and understanding.  Screening Tests Health Maintenance  Topic Date Due   Zoster Vaccines- Shingrix (1 of 2) Never done   COVID-19 Vaccine (4 - Booster for Pfizer series) 04/28/2020   OPHTHALMOLOGY  EXAM  05/31/2020   Hepatitis C Screening  01/17/2024 (Originally 11/16/1968)   FOOT EXAM  06/14/2021   HEMOGLOBIN A1C  08/10/2021   COLONOSCOPY (Pts 45-8yr Insurance coverage will need to be confirmed)  10/26/2030   Pneumonia Vaccine 70 Years old  Completed   INFLUENZA VACCINE  Completed   HPV VACCINES  Aged Out   TETANUS/TDAP  Discontinued    Health Maintenance  Health Maintenance Due  Topic Date Due   Zoster Vaccines- Shingrix (1 of 2) Never done   COVID-19 Vaccine (4 - Booster for Pfizer series) 04/28/2020   OPHTHALMOLOGY EXAM  05/31/2020    Colorectal cancer screening: Type of screening: Colonoscopy. Completed 2013. Repeat every 10 years  Lung Cancer Screening: (Low Dose CT Chest recommended if Age 70-80years, 30 pack-year currently smoking OR have quit w/in 15years.) does not qualify.   Additional Screening:  Hepatitis C Screening: does qualify; postponed  Vision Screening: Recommended annual ophthalmology exams for early detection of glaucoma and other disorders of the eye. Is the patient up to date with their annual eye exam?  No  Who is the provider or what is the name of the office in which the patient attends annual eye exams? Dr. BGloriann Loan   Dental Screening: Recommended annual dental exams for proper oral hygiene  Community Resource Referral / Chronic Care Management: CRR required this visit?  No  CCM required this visit?  No      Plan:     I have personally reviewed and noted the following in the patient's chart:   Medical and social history Use of alcohol, tobacco or illicit drugs  Current medications and supplements including opioid prescriptions. Patient is currently taking opioid prescriptions. Information provided to patient regarding non-opioid alternatives. Patient advised to discuss non-opioid treatment plan with their provider. Functional ability and status Nutritional status Physical activity Advanced directives List of other  physicians Hospitalizations, surgeries, and ER visits in previous 12 months Vitals Screenings to include cognitive, depression, and falls Referrals and appointments  In addition, I have reviewed and discussed with patient certain preventive protocols, quality metrics, and best practice recommendations. A written personalized care plan for preventive services as well as general preventive health recommendations were provided to patient.     Clemetine Marker, LPN   [id number]   Nurse Notes: none

## 2021-03-21 DIAGNOSIS — M5459 Other low back pain: Secondary | ICD-10-CM | POA: Diagnosis not present

## 2021-03-21 DIAGNOSIS — R293 Abnormal posture: Secondary | ICD-10-CM | POA: Diagnosis not present

## 2021-04-05 DIAGNOSIS — M5459 Other low back pain: Secondary | ICD-10-CM | POA: Diagnosis not present

## 2021-04-05 DIAGNOSIS — R293 Abnormal posture: Secondary | ICD-10-CM | POA: Diagnosis not present

## 2021-04-12 ENCOUNTER — Ambulatory Visit (INDEPENDENT_AMBULATORY_CARE_PROVIDER_SITE_OTHER): Payer: Medicare Other | Admitting: Unknown Physician Specialty

## 2021-04-12 ENCOUNTER — Encounter: Payer: Self-pay | Admitting: Unknown Physician Specialty

## 2021-04-12 DIAGNOSIS — I10 Essential (primary) hypertension: Secondary | ICD-10-CM

## 2021-04-12 DIAGNOSIS — E669 Obesity, unspecified: Secondary | ICD-10-CM

## 2021-04-12 DIAGNOSIS — F1120 Opioid dependence, uncomplicated: Secondary | ICD-10-CM

## 2021-04-12 DIAGNOSIS — F331 Major depressive disorder, recurrent, moderate: Secondary | ICD-10-CM

## 2021-04-12 DIAGNOSIS — E1169 Type 2 diabetes mellitus with other specified complication: Secondary | ICD-10-CM

## 2021-04-12 DIAGNOSIS — F419 Anxiety disorder, unspecified: Secondary | ICD-10-CM

## 2021-04-12 MED ORDER — METFORMIN HCL 1000 MG PO TABS: 1000.0000 mg | ORAL_TABLET | Freq: Two times a day (BID) | ORAL | 1 refills | Status: DC

## 2021-04-12 MED ORDER — ATORVASTATIN CALCIUM 40 MG PO TABS: 40.0000 mg | ORAL_TABLET | Freq: Every evening | ORAL | 3 refills | Status: DC

## 2021-04-12 MED ORDER — TIZANIDINE HCL 4 MG PO TABS: 4.0000 mg | ORAL_TABLET | Freq: Four times a day (QID) | ORAL | 1 refills | Status: DC | PRN

## 2021-04-12 MED ORDER — GABAPENTIN 600 MG PO TABS: 600.0000 mg | ORAL_TABLET | Freq: Three times a day (TID) | ORAL | 1 refills | Status: DC

## 2021-04-12 MED ORDER — GLIPIZIDE 10 MG PO TABS: 10.0000 mg | ORAL_TABLET | Freq: Every day | ORAL | 0 refills | Status: DC

## 2021-04-12 MED ORDER — DULOXETINE HCL 60 MG PO CPEP: 60.0000 mg | ORAL_CAPSULE | Freq: Every day | ORAL | 3 refills | Status: DC

## 2021-04-12 MED ORDER — METOPROLOL SUCCINATE ER 100 MG PO TB24: 100.0000 mg | ORAL_TABLET | Freq: Every day | ORAL | 3 refills | Status: DC

## 2021-04-12 MED ORDER — VALSARTAN-HYDROCHLOROTHIAZIDE 320-12.5 MG PO TABS: 1.0000 | ORAL_TABLET | Freq: Every day | ORAL | 3 refills | Status: DC

## 2021-04-12 MED ORDER — HYDROCODONE-ACETAMINOPHEN 10-325 MG PO TABS: 0.5000 | ORAL_TABLET | Freq: Three times a day (TID) | ORAL | 0 refills | Status: DC | PRN

## 2021-04-12 NOTE — Assessment & Plan Note (Signed)
Pt is seeing [place] instead of pain management as is willing to taper off of opioids.  We are tapering 5-10% a month.  Down to 51 today, and plan is #40 next month.

## 2021-04-12 NOTE — Progress Notes (Signed)
BP 136/82    Pulse 95    Temp 97.6 F (36.4 C) (Oral)    Resp 16    Ht 6\' 3"  (1.905 m)    Wt 261 lb 12.8 oz (118.8 kg)    SpO2 98%    BMI 32.72 kg/m    Subjective:    Patient ID: Evan Rivas, male    DOB: 12/15/1950, 71 y.o.   MRN: [id number]  HPI: Evan Rivas is a 71 y.o. male  Chief Complaint  Patient presents with   Follow-up   Hypertension   Diabetes  - Opioid dependence  Last visit increased  Farxiga to 10 mg.  Blood sugars 140's in the AM which is an improvement.  Due to check HgbA1C in 1 months  Chronic back and opioid dependence - we are working on a 10% taper monthly, on hold for about 2 month, but just completed PT and going to the gym.  The opioids and the taper allows him to be continually active.  No excessive somnolence.    Needs refills of all his meds due to change of insurance.    Depression screen [facility] 2/9 04/12/2021 03/10/2021 02/10/2021 11/11/2020 10/19/2020  Decreased Interest 1 1 1 1  0  Down, Depressed, Hopeless 0 1 1 1 1   PHQ - 2 Score 1 2 2 2 1   Altered sleeping 0 0 0 3 0  Tired, decreased energy 0 1 0 2 0  Change in appetite 0 0 0 0 0  Feeling bad or failure about yourself  0 0 0 0 0  Trouble concentrating 0 0 0 0 0  Moving slowly or fidgety/restless 0 0 0 1 0  Suicidal thoughts 0 0 0 0 0  PHQ-9 Score 1 3 2 8 1   Difficult doing work/chores Not difficult at all Not difficult at all Not difficult at all Somewhat difficult Not difficult at all  Some recent data might be hidden     Relevant past medical, surgical, family and social history reviewed and updated as indicated. Interim medical history since our last visit reviewed. Allergies and medications reviewed and updated.  Review of Systems  Per HPI unless specifically indicated above     Objective:    BP 136/82    Pulse 95    Temp 97.6 F (36.4 C) (Oral)    Resp 16    Ht 6\' 3"  (1.905 m)    Wt 261 lb 12.8 oz (118.8 kg)    SpO2 98%    BMI 32.72 kg/m   Wt Readings from Last 3  Encounters:  04/12/21 261 lb 12.8 oz (118.8 kg)  02/10/21 262 lb (118.8 kg)  02/01/21 259 lb (117.5 kg)    Physical Exam Constitutional:      General: He is not in acute distress.    Appearance: Normal appearance. He is well-developed.  HENT:     Head: Normocephalic and atraumatic.  Eyes:     General: Lids are normal. No scleral icterus.       Right eye: No discharge.        Left eye: No discharge.     Conjunctiva/sclera: Conjunctivae normal.  Neck:     Vascular: No carotid bruit or JVD.  Cardiovascular:     Rate and Rhythm: Normal rate and regular rhythm.     Heart sounds: Normal heart sounds.  Pulmonary:     Effort: Pulmonary effort is normal. No respiratory distress.     Breath sounds: Normal breath sounds.  Abdominal:     Palpations: There is no hepatomegaly or splenomegaly.  Musculoskeletal:        General: Normal range of motion.     Cervical back: Normal range of motion and neck supple.  Skin:    General: Skin is warm and dry.     Coloration: Skin is not pale.     Findings: No rash.  Neurological:     Mental Status: He is alert and oriented to person, place, and time.  Psychiatric:        Behavior: Behavior normal.        Thought Content: Thought content normal.        Judgment: Judgment normal.    Results for orders placed or performed in visit on 02/10/21  POCT HgB A1C  Result Value Ref Range   Hemoglobin A1C 8.9 (A) 4.0 - 5.6 %   HbA1c POC (<> result, manual entry)     HbA1c, POC (prediabetic range)     HbA1c, POC (controlled diabetic range)        Assessment & Plan:   Problem List Items Addressed This Visit       Unprioritized   Anxiety   Relevant Medications   DULoxetine (CYMBALTA) 60 MG capsule   Continuous opioid dependence ([place])    Pt is seeing [place] instead of pain management as is willing to taper off of opioids.  We are tapering 5-10% a month.  Down to 41 today, and plan is #40 next month.        Diabetes mellitus type 2 in obese [place] [facility] [place])     Doing better with Farxiga 10 mg.  Due for A1C check next month      Relevant Medications   atorvastatin (LIPITOR) 40 MG tablet   valsartan-hydrochlorothiazide (DIOVAN-HCT) 320-12.5 MG tablet   glipiZIDE (GLUCOTROL) 10 MG tablet   metFORMIN (GLUCOPHAGE) 1000 MG tablet   Essential hypertension    Stable, continue present medications.        Relevant Medications   atorvastatin (LIPITOR) 40 MG tablet   metoprolol succinate (TOPROL-XL) 100 MG 24 hr tablet   valsartan-hydrochlorothiazide (DIOVAN-HCT) 320-12.5 MG tablet   Major depressive disorder, recurrent episode, moderate (HCC)   Relevant Medications   DULoxetine (CYMBALTA) 60 MG capsule    Eye and dental visits pending this month  Follow up plan: Return in about 4 weeks (around 05/10/2021).

## 2021-04-12 NOTE — Assessment & Plan Note (Signed)
Stable, continue present medications.   

## 2021-04-12 NOTE — Assessment & Plan Note (Signed)
Doing better with Farxiga 10 mg.  Due for A1C check next month

## 2021-04-19 DIAGNOSIS — E113393 Type 2 diabetes mellitus with moderate nonproliferative diabetic retinopathy without macular edema, bilateral: Secondary | ICD-10-CM | POA: Diagnosis not present

## 2021-04-19 DIAGNOSIS — H524 Presbyopia: Secondary | ICD-10-CM | POA: Diagnosis not present

## 2021-04-20 DIAGNOSIS — H524 Presbyopia: Secondary | ICD-10-CM | POA: Diagnosis not present

## 2021-05-09 ENCOUNTER — Telehealth: Payer: Self-pay | Admitting: Nurse Practitioner

## 2021-05-09 ENCOUNTER — Ambulatory Visit (INDEPENDENT_AMBULATORY_CARE_PROVIDER_SITE_OTHER): Payer: Medicare Other | Admitting: Nurse Practitioner

## 2021-05-09 ENCOUNTER — Encounter: Payer: Self-pay | Admitting: Nurse Practitioner

## 2021-05-09 VITALS — BP 120/80 | HR 100 | Temp 97.9°F | Resp 18 | Ht 75.0 in | Wt 266.1 lb

## 2021-05-09 DIAGNOSIS — M545 Low back pain, unspecified: Secondary | ICD-10-CM

## 2021-05-09 DIAGNOSIS — F3341 Major depressive disorder, recurrent, in partial remission: Secondary | ICD-10-CM

## 2021-05-09 DIAGNOSIS — E669 Obesity, unspecified: Secondary | ICD-10-CM

## 2021-05-09 DIAGNOSIS — I10 Essential (primary) hypertension: Secondary | ICD-10-CM

## 2021-05-09 DIAGNOSIS — E7800 Pure hypercholesterolemia, unspecified: Secondary | ICD-10-CM | POA: Diagnosis not present

## 2021-05-09 DIAGNOSIS — E1169 Type 2 diabetes mellitus with other specified complication: Secondary | ICD-10-CM

## 2021-05-09 DIAGNOSIS — I700 Atherosclerosis of aorta: Secondary | ICD-10-CM | POA: Diagnosis not present

## 2021-05-09 DIAGNOSIS — F419 Anxiety disorder, unspecified: Secondary | ICD-10-CM

## 2021-05-09 DIAGNOSIS — M5136 Other intervertebral disc degeneration, lumbar region: Secondary | ICD-10-CM

## 2021-05-09 DIAGNOSIS — J301 Allergic rhinitis due to pollen: Secondary | ICD-10-CM

## 2021-05-09 DIAGNOSIS — G8929 Other chronic pain: Secondary | ICD-10-CM

## 2021-05-09 DIAGNOSIS — F1120 Opioid dependence, uncomplicated: Secondary | ICD-10-CM

## 2021-05-09 MED ORDER — HYDROCODONE-ACETAMINOPHEN 10-325 MG PO TABS: 0.5000 | ORAL_TABLET | Freq: Three times a day (TID) | ORAL | 0 refills | Status: DC | PRN

## 2021-05-09 MED ORDER — FLUTICASONE PROPIONATE 50 MCG/ACT NA SUSP: 2.0000 | Freq: Every day | NASAL | 2 refills | Status: DC

## 2021-05-09 NOTE — Telephone Encounter (Signed)
Medication Refill - Medication:  fluticasone (FLONASE) 50 MCG/ACT nasal spray   Has the patient contacted their pharmacy? Yes.   The pt needed the medication sent to [facility], not [facility], so he is now needing it canceled from [facility] and a new script to [facility].  Preferred Pharmacy (with phone number or street name):  [place], [place]  Phone:  [phone] Fax:  [phone]   Has the patient been seen for an appointment in the last year OR does the patient have an upcoming appointment? Yes.    Agent: Please be advised that RX refills may take up to 3 business days. We ask that you follow-up with your pharmacy.

## 2021-05-09 NOTE — Progress Notes (Signed)
BP 120/80    Pulse 100    Temp 97.9 F (36.6 C)    Resp 18    Ht 6\' 3"  (1.905 m)    Wt 266 lb 1.6 oz (120.7 kg)    SpO2 98%    BMI 33.26 kg/m    Subjective:    Patient ID: Evan Rivas, male    DOB: 02-20-1951, 71 y.o.   MRN: [id number]  HPI: STEFAN MARKARIAN is a 71 y.o. male  Chief Complaint  Patient presents with   Follow-up   Depression   Hyperlipidemia   Hypertension   Diabetes   Back Pain   Medication Refill   Depression/anxiety: His PHQ9 and GAD screening was negative. He is currently taking Cymbalta 60 mg daily and hydroxyzine 25 mg TID if needed. He denies any suicidal thoughts and says he is doing well with his mood.  Depression screen [facility] 2/9 05/09/2021 04/12/2021 03/10/2021 02/10/2021 11/11/2020  Decreased Interest 0 1 1 1 1   Down, Depressed, Hopeless 0 0 1 1 1   PHQ - 2 Score 0 1 2 2 2   Altered sleeping 0 0 0 0 3  Tired, decreased energy 0 0 1 0 2  Change in appetite 0 0 0 0 0  Feeling bad or failure about yourself  0 0 0 0 0  Trouble concentrating 0 0 0 0 0  Moving slowly or fidgety/restless 0 0 0 0 1  Suicidal thoughts 0 0 0 0 0  PHQ-9 Score 0 1 3 2 8   Difficult doing work/chores Not difficult at all Not difficult at all Not difficult at all Not difficult at all Somewhat difficult  Some recent data might be hidden    GAD 7 : Generalized Anxiety Score 05/09/2021 11/11/2020 08/24/2020 06/27/2019  Nervous, Anxious, on Edge 0 2 0 1  Control/stop worrying 0 1 0 1  Worry too much - different things 0 1 0 1  Trouble relaxing 0 0 0 1  Restless 0 0 0 0  Easily annoyed or irritable 0 1 0 1  Afraid - awful might happen 0 0 0 0  Total GAD 7 Score 0 5 0 5  Anxiety Difficulty Not difficult at all Somewhat difficult Not difficult at all Somewhat difficult     Hyperlipidemia/Aortic atherosclerosis: He is currently taking atorvastatin 40 mg nightly.  His last LDL was 86 on 09/21/2020.  Will get labs at next visit.   Hypertension: His blood pressure today is 120/80.  He  says his blood pressure at home has been around 120s/80s. He is currently taking valsartan-hydrochlorothiazide 320-12.5 mg daily, metoprolol 100 mg daily, and spironolactone 25 mg twice a week, on Wednesday and Saturday. He denies any chest pain, shortness of breath, headaches or blurred vision. He says he has been going to the gym lately.   Diabetes: His last A1C was 8.9 on 02/10/2021.  It is not quite time to recheck, will recheck at next visit.  His blood sugars have been  150s at home. He is currently taking metformin 1000 mg BID, glipizide 10 mg daily, trulicity 1.5 mg weekly and farxiga 10 mg daily. He denies any polyuria, polydipsia and polyphagia.   Back pain/DDD/continuous opioid dependence: We are currently tapering off of opioids, he is not seen at pain management. The taper is 5-10% each month. He was previously getting 44 hydrocodone, we are decreasing to 40 for this month. He said this past month he did well and he is currently still going to  the gym. Plan will be to taper down to 36 next month.   Allergies: He says spring is right around the corner and he wants a refill of his flonase to get ready for spring. Refill sent.  Not currently having symptoms.   Relevant past medical, surgical, family and social history reviewed and updated as indicated. Interim medical history since our last visit reviewed. Allergies and medications reviewed and updated.  Review of Systems  Constitutional: Negative for fever or weight change.  Respiratory: Negative for cough and shortness of breath.   Cardiovascular: Negative for chest pain or palpitations.  Gastrointestinal: Negative for abdominal pain, no bowel changes.  Musculoskeletal: Negative for gait problem or joint swelling. Positive for back pain Skin: Negative for rash.  Neurological: Negative for dizziness or headache.  No other specific complaints in a complete review of systems (except as listed in HPI above).      Objective:    BP  120/80    Pulse 100    Temp 97.9 F (36.6 C)    Resp 18    Ht 6\' 3"  (1.905 m)    Wt 266 lb 1.6 oz (120.7 kg)    SpO2 98%    BMI 33.26 kg/m   Wt Readings from Last 3 Encounters:  05/09/21 266 lb 1.6 oz (120.7 kg)  04/12/21 261 lb 12.8 oz (118.8 kg)  02/10/21 262 lb (118.8 kg)    Physical Exam  Constitutional: Patient appears well-developed and well-nourished.  No distress.  HEENT: head atraumatic, normocephalic, pupils equal and reactive to light, neck supple Cardiovascular: Normal rate, regular rhythm and normal heart sounds.  No murmur heard. No BLE edema. Pulmonary/Chest: Effort normal and breath sounds normal. No respiratory distress. Abdominal: Soft.  There is no tenderness. Psychiatric: Patient has a normal mood and affect. behavior is normal. Judgment and thought content normal.   Results for orders placed or performed in visit on 02/10/21  POCT HgB A1C  Result Value Ref Range   Hemoglobin A1C 8.9 (A) 4.0 - 5.6 %   HbA1c POC (<> result, manual entry)     HbA1c, POC (prediabetic range)     HbA1c, POC (controlled diabetic range)        Assessment & Plan:   1. Recurrent major depressive disorder, in partial remission ([place]) -continue current treatment plan, doing well  2. Anxiety -continue current treatment plan, doing well  3. Pure hypercholesterolemia -continue current treatment, will get labs next visit  4. Aortic atherosclerosis ([place]) -continue current treatment  5. Essential hypertension -continue current treatment, blood pressure at goal  6. Diabetes mellitus type 2 in obese [facility]) -continue current treatment, will get A1C at next visit  7. Chronic bilateral low back pain without sciatica -continue working on tapering off opioids. - HYDROcodone-acetaminophen (NORCO) 10-325 MG tablet; Take 0.5-1 tablets by mouth every 8 (eight) hours as needed for severe pain. max #40 for the month - gradually decrease dose  Dispense: 40 tablet; Refill: 0  8. DDD (degenerative  disc disease), lumbar -continue working on tapering off opioids - HYDROcodone-acetaminophen (NORCO) 10-325 MG tablet; Take 0.5-1 tablets by mouth every 8 (eight) hours as needed for severe pain. max #40 for the month - gradually decrease dose  Dispense: 40 tablet; Refill: 0  9. Continuous opioid dependence ([place]) -continue working on tapering off opioids - HYDROcodone-acetaminophen (NORCO) 10-325 MG tablet; Take 0.5-1 tablets by mouth every 8 (eight) hours as needed for severe pain. max #40 for the month - gradually decrease dose  Dispense: 40 tablet; Refill: 0  10. Seasonal allergic rhinitis due to pollen  - fluticasone (FLONASE) 50 MCG/ACT nasal spray; Place 2 sprays into both nostrils daily.  Dispense: 11.1 mL; Refill: 2   Follow up plan: Return in about 4 weeks (around 06/06/2021) for follow up.

## 2021-05-10 ENCOUNTER — Other Ambulatory Visit: Payer: Self-pay | Admitting: Nurse Practitioner

## 2021-05-10 DIAGNOSIS — J301 Allergic rhinitis due to pollen: Secondary | ICD-10-CM

## 2021-05-10 MED ORDER — FLUTICASONE PROPIONATE 50 MCG/ACT NA SUSP: 2.0000 | Freq: Every day | NASAL | 2 refills | Status: DC

## 2021-05-10 NOTE — Telephone Encounter (Signed)
I have cancelled this script out at [facility]. [facility]

## 2021-06-06 ENCOUNTER — Ambulatory Visit (INDEPENDENT_AMBULATORY_CARE_PROVIDER_SITE_OTHER): Payer: Medicare Other | Admitting: Nurse Practitioner

## 2021-06-06 ENCOUNTER — Encounter: Payer: Self-pay | Admitting: Nurse Practitioner

## 2021-06-06 ENCOUNTER — Other Ambulatory Visit: Payer: Self-pay

## 2021-06-06 VITALS — BP 112/60 | HR 92 | Temp 97.9°F | Resp 16 | Ht 75.0 in | Wt 262.5 lb

## 2021-06-06 DIAGNOSIS — F3341 Major depressive disorder, recurrent, in partial remission: Secondary | ICD-10-CM

## 2021-06-06 DIAGNOSIS — E7800 Pure hypercholesterolemia, unspecified: Secondary | ICD-10-CM

## 2021-06-06 DIAGNOSIS — I10 Essential (primary) hypertension: Secondary | ICD-10-CM | POA: Diagnosis not present

## 2021-06-06 DIAGNOSIS — G8929 Other chronic pain: Secondary | ICD-10-CM

## 2021-06-06 DIAGNOSIS — E669 Obesity, unspecified: Secondary | ICD-10-CM

## 2021-06-06 DIAGNOSIS — M545 Low back pain, unspecified: Secondary | ICD-10-CM | POA: Diagnosis not present

## 2021-06-06 DIAGNOSIS — E1169 Type 2 diabetes mellitus with other specified complication: Secondary | ICD-10-CM | POA: Diagnosis not present

## 2021-06-06 DIAGNOSIS — F1120 Opioid dependence, uncomplicated: Secondary | ICD-10-CM

## 2021-06-06 DIAGNOSIS — F419 Anxiety disorder, unspecified: Secondary | ICD-10-CM

## 2021-06-06 DIAGNOSIS — M5136 Other intervertebral disc degeneration, lumbar region: Secondary | ICD-10-CM

## 2021-06-06 MED ORDER — HYDROCODONE-ACETAMINOPHEN 10-325 MG PO TABS: 0.5000 | ORAL_TABLET | Freq: Three times a day (TID) | ORAL | 0 refills | Status: DC | PRN

## 2021-06-06 NOTE — Progress Notes (Signed)
? ?BP 112/60 (BP Location: Right Arm, Patient Position: Sitting)   Pulse 92   Temp 97.9 ?F (36.6 ?C) (Oral)   Resp 16   Ht '6\' 3"'$  (1.905 m)   Wt 262 lb 8 oz (119.1 kg)   SpO2 98%   BMI 32.81 kg/m?   ? ?Subjective:  ? ? Patient ID: Evan Rivas, male    DOB: 05/11/1950, 71 y.o.   MRN: [id number] ? ?HPI: ?Evan Rivas is a 71 y.o. male ? ?Chief Complaint  ?Patient presents with  ? Follow-up  ? ?Hyperlipidemia/Aortic atherosclerosis: He has been taking his atorvastatin.  He denies any myalgia.  His last LDL was 86 on 09/21/2020.  Will get labs today.  ?  ?Hypertension:  He is currently taking valsartan-hydrochlorothiazide 320-12.5 mg daily, metoprolol 100 mg daily, and spironolactone 25 mg twice a week, on Wednesday and Saturday. His blood pressure today was 112/60.  He is going to the gym more regularly. He denies any chest pain, shortness of breath headaches or blurred vision.  ?  ?Diabetes: His last A1C was 8.9 on 02/10/2021. His blood sugars have been  150s at home. He is currently taking metformin 1000 mg BID, glipizide 10 mg daily, trulicity 1.5 mg weekly and farxiga 10 mg daily. He denies any hypoglycemic episodes, polyuria, polydipsia or polyphagia. He says his blood sugar was in the 300s this morning. He says last night he had a sweet tea and cereal.  Will get A1C today.  ?  ?Back pain/DDD/continuous opioid dependence: We are currently tapering off of opioids, he is not seen at pain management. The taper is 5-10% each month. Last month he received 40 . He says he feels like he needs one more month getting the #40 hydrocodone.  He says that has really increased going to the gym and is having break through pain. Will do #40 this month and drop down to #36 next month.  ? ?Depression/anxiety: His depression and anxiety screening were negative. He is doing well on current treatment plan. He is currently on cymbalta 60 mg daily.  ?Depression screen [facility] 2/9 05/09/2021 04/12/2021 03/10/2021 02/10/2021  11/11/2020  ?Decreased Interest 0 '1 1 1 1  '$ ?Down, Depressed, Hopeless 0 0 '1 1 1  '$ ?PHQ - 2 Score 0 '1 2 2 2  '$ ?Altered sleeping 0 0 0 0 3  ?Tired, decreased energy 0 0 1 0 2  ?Change in appetite 0 0 0 0 0  ?Feeling bad or failure about yourself  0 0 0 0 0  ?Trouble concentrating 0 0 0 0 0  ?Moving slowly or fidgety/restless 0 0 0 0 1  ?Suicidal thoughts 0 0 0 0 0  ?PHQ-9 Score 0 '1 3 2 8  '$ ?Difficult doing work/chores Not difficult at all Not difficult at all Not difficult at all Not difficult at all Somewhat difficult  ?Some recent data might be hidden  ?  ?GAD 7 : Generalized Anxiety Score 05/09/2021 11/11/2020 08/24/2020 06/27/2019  ?Nervous, Anxious, on Edge 0 2 0 1  ?Control/stop worrying 0 1 0 1  ?Worry too much - different things 0 1 0 1  ?Trouble relaxing 0 0 0 1  ?Restless 0 0 0 0  ?Easily annoyed or irritable 0 1 0 1  ?Afraid - awful might happen 0 0 0 0  ?Total GAD 7 Score 0 5 0 5  ?Anxiety Difficulty Not difficult at all Somewhat difficult Not difficult at all Somewhat difficult  ? ?  ? ?Relevant past  medical, surgical, family and social history reviewed and updated as indicated. Interim medical history since our last visit reviewed. ?Allergies and medications reviewed and updated. ? ?Review of Systems ? ?Constitutional: Negative for fever or weight change.  ?Respiratory: Negative for cough and shortness of breath.   ?Cardiovascular: Negative for chest pain or palpitations.  ?Gastrointestinal: Negative for abdominal pain, no bowel changes.  ?Musculoskeletal: Negative for gait problem or joint swelling.  ?Skin: Negative for rash.  ?Neurological: Negative for dizziness or headache.  ?No other specific complaints in a complete review of systems (except as listed in HPI above).  ? ?   ?Objective:  ?  ?BP 112/60 (BP Location: Right Arm, Patient Position: Sitting)   Pulse 92   Temp 97.9 ?F (36.6 ?C) (Oral)   Resp 16   Ht '6\' 3"'$  (1.905 m)   Wt 262 lb 8 oz (119.1 kg)   SpO2 98%   BMI 32.81 kg/m?   ?Wt Readings from  Last 3 Encounters:  ?06/06/21 262 lb 8 oz (119.1 kg)  ?05/09/21 266 lb 1.6 oz (120.7 kg)  ?04/12/21 261 lb 12.8 oz (118.8 kg)  ?  ?Physical Exam ? ?Constitutional: Patient appears well-developed and well-nourished. Obese  No distress.  ?HEENT: head atraumatic, normocephalic, pupils equal and reactive to light,  neck supple ?Cardiovascular: Normal rate, regular rhythm and normal heart sounds.  No murmur heard. No BLE edema. ?Pulmonary/Chest: Effort normal and breath sounds normal. No respiratory distress. ?Abdominal: Soft.  There is no tenderness. ?Psychiatric: Patient has a normal mood and affect. behavior is normal. Judgment and thought content normal.  ? ?Results for orders placed or performed in visit on 02/10/21  ?POCT HgB A1C  ?Result Value Ref Range  ? Hemoglobin A1C 8.9 (A) 4.0 - 5.6 %  ? HbA1c POC (<> result, manual entry)    ? HbA1c, POC (prediabetic range)    ? HbA1c, POC (controlled diabetic range)    ? ?   ?Assessment & Plan:  ? ?1. Pure hypercholesterolemia ?-continue current treatment plan ?- Lipid panel ?- Hemoglobin A1c ? ?2. Essential hypertension ?-continue current treatment plan ?- CBC with Differential/Platelet ?- COMPLETE METABOLIC PANEL WITH GFR ? ?3. Diabetes mellitus type 2 in obese [facility]) ?-continue current treatment plan ?- COMPLETE METABOLIC PANEL WITH GFR ?- Hemoglobin A1c ? ?4. Chronic bilateral low back pain without sciatica ? ?- HYDROcodone-acetaminophen (NORCO) 10-325 MG tablet; Take 0.5-1 tablets by mouth every 8 (eight) hours as needed for severe pain. max #40 for the month - gradually decrease dose  Dispense: 40 tablet; Refill: 0 ? ?5. DDD (degenerative disc disease), lumbar ? ?- HYDROcodone-acetaminophen (NORCO) 10-325 MG tablet; Take 0.5-1 tablets by mouth every 8 (eight) hours as needed for severe pain. max #40 for the month - gradually decrease dose  Dispense: 40 tablet; Refill: 0 ? ?6. Continuous opioid dependence ([place]) ? ?- HYDROcodone-acetaminophen (NORCO) 10-325 MG tablet;  Take 0.5-1 tablets by mouth every 8 (eight) hours as needed for severe pain. max #40 for the month - gradually decrease dose  Dispense: 40 tablet; Refill: 0 ? ?7. Recurrent major depressive disorder, in partial remission ([place]) ?-continue current treatment plan ? ?8. Anxiety ?-continue current treatment plan ?Follow up plan: ?Return in about 4 weeks (around 07/04/2021) for follow up. ? ? ? ? ? ?

## 2021-06-07 LAB — CBC WITH DIFFERENTIAL/PLATELET
Absolute Monocytes: 458 cells/uL (ref 200–950)
Basophils Absolute: 52 cells/uL (ref 0–200)
Basophils Relative: 1 %
Eosinophils Absolute: 307 cells/uL (ref 15–500)
Eosinophils Relative: 5.9 %
HCT: 41.5 % (ref 38.5–50.0)
Hemoglobin: 13.9 g/dL (ref 13.2–17.1)
Lymphs Abs: 1882 cells/uL (ref 850–3900)
MCH: 32.9 pg (ref 27.0–33.0)
MCHC: 33.5 g/dL (ref 32.0–36.0)
MCV: 98.1 fL (ref 80.0–100.0)
MPV: 11.1 fL (ref 7.5–12.5)
Monocytes Relative: 8.8 %
Neutro Abs: 2501 cells/uL (ref 1500–7800)
Neutrophils Relative %: 48.1 %
Platelets: 211 10*3/uL (ref 140–400)
RBC: 4.23 10*6/uL (ref 4.20–5.80)
RDW: 13.2 % (ref 11.0–15.0)
Total Lymphocyte: 36.2 %
WBC: 5.2 10*3/uL (ref 3.8–10.8)

## 2021-06-07 LAB — LIPID PANEL
Cholesterol: 131 mg/dL (ref <200)
HDL: 30 mg/dL — ABNORMAL LOW (ref >=40)
LDL Cholesterol (Calc): 70 mg/dL (calc)
Non-HDL Cholesterol (Calc): 101 mg/dL (calc) (ref <130)
Total CHOL/HDL Ratio: 4.4 (calc) (ref <5.0)
Triglycerides: 224 mg/dL — ABNORMAL HIGH (ref <150)

## 2021-06-07 LAB — COMPLETE METABOLIC PANEL WITH GFR
AG Ratio: 1.7 (calc) (ref 1.0–2.5)
ALT: 14 U/L (ref 9–46)
AST: 12 U/L (ref 10–35)
Albumin: 4 g/dL (ref 3.6–5.1)
Alkaline phosphatase (APISO): 86 U/L (ref 35–144)
BUN/Creatinine Ratio: 20 (calc) (ref 6–22)
BUN: 37 mg/dL — ABNORMAL HIGH (ref 7–25)
CO2: 28 mmol/L (ref 20–32)
Calcium: 9.4 mg/dL (ref 8.6–10.3)
Chloride: 105 mmol/L (ref 98–110)
Creat: 1.85 mg/dL — ABNORMAL HIGH (ref 0.70–1.28)
Globulin: 2.4 g/dL (calc) (ref 1.9–3.7)
Glucose, Bld: 266 mg/dL — ABNORMAL HIGH (ref 65–99)
Potassium: 5.2 mmol/L (ref 3.5–5.3)
Sodium: 142 mmol/L (ref 135–146)
Total Bilirubin: 0.5 mg/dL (ref 0.2–1.2)
Total Protein: 6.4 g/dL (ref 6.1–8.1)
eGFR: 39 mL/min/{1.73_m2} — ABNORMAL LOW (ref >=60)

## 2021-06-07 LAB — HEMOGLOBIN A1C
Hgb A1c MFr Bld: 8.1 % of total Hgb — ABNORMAL HIGH (ref <5.7)
Mean Plasma Glucose: 186 mg/dL
eAG (mmol/L): 10.3 mmol/L

## 2021-06-09 ENCOUNTER — Ambulatory Visit: Payer: Self-pay | Admitting: *Deleted

## 2021-06-09 NOTE — Telephone Encounter (Signed)
Pt returned call and was given his lab result message from Serafina Royals, [place] dated 06/07/2021 at 12:53 PM. ? ?No questions from pt. ?Reason for Disposition ? Health Information question, no triage required and triager able to answer question ? ?Answer Assessment - Initial Assessment Questions ?1. REASON FOR CALL or QUESTION: "What is your reason for calling today?" or "How can I best help you?" or "What question do you have that I can help answer?" ?    Lab results message read to him.   He also saw it on MyChart. ? ?Protocols used: Information Only Call - No Triage-A-AH ? ?

## 2021-07-04 ENCOUNTER — Other Ambulatory Visit: Payer: Self-pay

## 2021-07-04 ENCOUNTER — Ambulatory Visit (INDEPENDENT_AMBULATORY_CARE_PROVIDER_SITE_OTHER): Payer: Medicare Other | Admitting: Nurse Practitioner

## 2021-07-04 ENCOUNTER — Encounter: Payer: Self-pay | Admitting: Nurse Practitioner

## 2021-07-04 VITALS — BP 118/72 | HR 85 | Temp 97.8°F | Resp 16 | Ht 75.0 in | Wt 261.5 lb

## 2021-07-04 DIAGNOSIS — M5136 Other intervertebral disc degeneration, lumbar region: Secondary | ICD-10-CM | POA: Diagnosis not present

## 2021-07-04 DIAGNOSIS — G8929 Other chronic pain: Secondary | ICD-10-CM

## 2021-07-04 DIAGNOSIS — M545 Low back pain, unspecified: Secondary | ICD-10-CM | POA: Diagnosis not present

## 2021-07-04 DIAGNOSIS — F1120 Opioid dependence, uncomplicated: Secondary | ICD-10-CM | POA: Diagnosis not present

## 2021-07-04 MED ORDER — HYDROCODONE-ACETAMINOPHEN 10-325 MG PO TABS: 0.5000 | ORAL_TABLET | Freq: Three times a day (TID) | ORAL | 0 refills | Status: AC | PRN

## 2021-07-04 MED ORDER — HYDROCODONE-ACETAMINOPHEN 10-325 MG PO TABS: 0.5000 | ORAL_TABLET | Freq: Three times a day (TID) | ORAL | 0 refills | Status: DC | PRN

## 2021-07-04 NOTE — Progress Notes (Signed)
? ?BP 118/72   Pulse 85   Temp 97.8 ?F (36.6 ?C) (Oral)   Resp 16   Ht '6\' 3"'  (1.905 m)   Wt 261 lb 8 oz (118.6 kg)   SpO2 96%   BMI 32.69 kg/m?   ? ?Subjective:  ? ? Patient ID: Evan Rivas, male    DOB: December 20, 1950, 71 y.o.   MRN: [id number] ? ?HPI: ?Evan Rivas is a 71 y.o. male ? ?Chief Complaint  ?Patient presents with  ? Follow-up  ?  4 week recheck  ? ?Back pain/DDD/continuous opioid dependence: He is working on being tapered off of opioids.  He does not see pain management.  The taper is 5-10% each month.  Last month we stayed at #40 hydrocodone, due to him increasing his physical activity at the gym.  Will decrease to #36 this month. PDMP checked, last fill was 06/06/2021,  he has a current drug test and pain management contract on file, due in September. He is doing well and working out in [employer] and playing golf.  Will continue with current treatment plan. ? ?Relevant past medical, surgical, family and social history reviewed and updated as indicated. Interim medical history since our last visit reviewed. ?Allergies and medications reviewed and updated. ? ?Review of Systems ? ?Constitutional: Negative for fever or weight change.  ?Respiratory: Negative for cough and shortness of breath.   ?Cardiovascular: Negative for chest pain or palpitations.  ?Gastrointestinal: Negative for abdominal pain, no bowel changes.  ?Musculoskeletal: Negative for gait problem or joint swelling.  ?Skin: Negative for rash.  ?Neurological: Negative for dizziness or headache.  ?No other specific complaints in a complete review of systems (except as listed in HPI above).  ? ?   ?Objective:  ?  ?BP 118/72   Pulse 85   Temp 97.8 ?F (36.6 ?C) (Oral)   Resp 16   Ht '6\' 3"'  (1.905 m)   Wt 261 lb 8 oz (118.6 kg)   SpO2 96%   BMI 32.69 kg/m?   ?Wt Readings from Last 3 Encounters:  ?07/04/21 261 lb 8 oz (118.6 kg)  ?06/06/21 262 lb 8 oz (119.1 kg)  ?05/09/21 266 lb 1.6 oz (120.7 kg)  ?  ?Physical  Exam ? ?Constitutional: Patient appears well-developed and well-nourished. Obese  No distress.  ?HEENT: head atraumatic, normocephalic, pupils equal and reactive to light,  neck supple ?Cardiovascular: Normal rate, regular rhythm and normal heart sounds.  No murmur heard. No BLE edema. ?Pulmonary/Chest: Effort normal and breath sounds normal. No respiratory distress. ?Abdominal: Soft.  There is no tenderness. ?Psychiatric: Patient has a normal mood and affect. behavior is normal. Judgment and thought content normal.  ? ?Results for orders placed or performed in visit on 06/06/21  ?Lipid panel  ?Result Value Ref Range  ? Cholesterol 131 <200 mg/dL  ? HDL 30 (L) > OR = 40 mg/dL  ? Triglycerides 224 (H) <150 mg/dL  ? LDL Cholesterol (Calc) 70 mg/dL (calc)  ? Total CHOL/HDL Ratio 4.4 <5.0 (calc)  ? Non-HDL Cholesterol (Calc) 101 <130 mg/dL (calc)  ?CBC with Differential/Platelet  ?Result Value Ref Range  ? WBC 5.2 3.8 - 10.8 Thousand/uL  ? RBC 4.23 4.20 - 5.80 Million/uL  ? Hemoglobin 13.9 13.2 - 17.1 g/dL  ? HCT 41.5 38.5 - 50.0 %  ? MCV 98.1 80.0 - 100.0 fL  ? MCH 32.9 27.0 - 33.0 pg  ? MCHC 33.5 32.0 - 36.0 g/dL  ? RDW 13.2 11.0 - 15.0 %  ?  Platelets 211 140 - 400 Thousand/uL  ? MPV 11.1 7.5 - 12.5 fL  ? Neutro Abs 2,501 1,500 - 7,800 cells/uL  ? Lymphs Abs 1,882 850 - 3,900 cells/uL  ? Absolute Monocytes 458 200 - 950 cells/uL  ? Eosinophils Absolute 307 15 - 500 cells/uL  ? Basophils Absolute 52 0 - 200 cells/uL  ? Neutrophils Relative % 48.1 %  ? Total Lymphocyte 36.2 %  ? Monocytes Relative 8.8 %  ? Eosinophils Relative 5.9 %  ? Basophils Relative 1.0 %  ?COMPLETE METABOLIC PANEL WITH GFR  ?Result Value Ref Range  ? Glucose, Bld 266 (H) 65 - 99 mg/dL  ? BUN 37 (H) 7 - 25 mg/dL  ? Creat 1.85 (H) 0.70 - 1.28 mg/dL  ? eGFR 39 (L) > OR = 60 mL/min/1.64m  ? BUN/Creatinine Ratio 20 6 - 22 (calc)  ? Sodium 142 135 - 146 mmol/L  ? Potassium 5.2 3.5 - 5.3 mmol/L  ? Chloride 105 98 - 110 mmol/L  ? CO2 28 20 - 32 mmol/L  ?  Calcium 9.4 8.6 - 10.3 mg/dL  ? Total Protein 6.4 6.1 - 8.1 g/dL  ? Albumin 4.0 3.6 - 5.1 g/dL  ? Globulin 2.4 1.9 - 3.7 g/dL (calc)  ? AG Ratio 1.7 1.0 - 2.5 (calc)  ? Total Bilirubin 0.5 0.2 - 1.2 mg/dL  ? Alkaline phosphatase (APISO) 86 35 - 144 U/L  ? AST 12 10 - 35 U/L  ? ALT 14 9 - 46 U/L  ?Hemoglobin A1c  ?Result Value Ref Range  ? Hgb A1c MFr Bld 8.1 (H) <5.7 % of total Hgb  ? Mean Plasma Glucose 186 mg/dL  ? eAG (mmol/L) 10.3 mmol/L  ? ?   ?Assessment & Plan:  ? ?1. DDD (degenerative disc disease), lumbar ? ?- HYDROcodone-acetaminophen (NORCO) 10-325 MG tablet; Take 0.5-1 tablets by mouth every 8 (eight) hours as needed for severe pain. max #36 for the month - gradually decrease dose  Dispense: 36 tablet; Refill: 0 ?- HYDROcodone-acetaminophen (NORCO) 10-325 MG tablet; Take 0.5-1 tablets by mouth every 8 (eight) hours as needed. Fill 08/02/2021 max #36 for the month - gradually decrease dose  Dispense: 36 tablet; Refill: 0 ? ?2. Chronic bilateral low back pain without sciatica ? ?- HYDROcodone-acetaminophen (NORCO) 10-325 MG tablet; Take 0.5-1 tablets by mouth every 8 (eight) hours as needed for severe pain. max #36 for the month - gradually decrease dose  Dispense: 36 tablet; Refill: 0 ?- HYDROcodone-acetaminophen (NORCO) 10-325 MG tablet; Take 0.5-1 tablets by mouth every 8 (eight) hours as needed. Fill 08/02/2021 max #36 for the month - gradually decrease dose  Dispense: 36 tablet; Refill: 0 ? ?3. Continuous opioid dependence ([place] ? ?- HYDROcodone-acetaminophen (NORCO) 10-325 MG tablet; Take 0.5-1 tablets by mouth every 8 (eight) hours as needed for severe pain. max #36 for the month - gradually decrease dose  Dispense: 36 tablet; Refill: 0 ?- HYDROcodone-acetaminophen (NORCO) 10-325 MG tablet; Take 0.5-1 tablets by mouth every 8 (eight) hours as needed. Fill 08/02/2021 max #36 for the month - gradually decrease dose  Dispense: 36 tablet; Refill: 0  ? ?Follow up plan: ?Return in about 2 months (around  09/03/2021) for follow up. ? ? ? ? ? ?

## 2021-07-13 ENCOUNTER — Other Ambulatory Visit: Payer: Self-pay | Admitting: Unknown Physician Specialty

## 2021-07-13 NOTE — Telephone Encounter (Signed)
Requested medication (s) are due for refill today: yes ? ?Requested medication (s) are on the active medication list: yes ? ?Last refill:  04/12/21 #90 with 0 RF ? ?Future visit scheduled:09/01/21 ? ?Notes to clinic:  I know labs are within date but creat is more elevated each time over the last three draws, please assess. ? ? ?  ? ?Requested Prescriptions  ?Pending Prescriptions Disp Refills  ? glipiZIDE (GLUCOTROL) 10 MG tablet [Pharmacy Med Name: GLIPIZIDE TABS '10MG'$ ] 90 tablet 3  ?  Sig: TAKE 1 TABLET DAILY BEFORE BREAKFAST  ?  ? Endocrinology:  Diabetes - Sulfonylureas Failed - 07/13/2021  8:37 AM  ?  ?  Failed - HBA1C is between 0 and 7.9 and within 180 days  ?  HbA1c, POC (prediabetic range)  ?Date Value Ref Range Status  ?03/25/2018 8.9 (A) 5.7 - 6.4 % Final  ? ?HbA1c, POC (controlled diabetic range)  ?Date Value Ref Range Status  ?03/25/2018 8.9 (A) 0.0 - 7.0 % Final  ? ?HbA1c POC (<> result, manual entry)  ?Date Value Ref Range Status  ?03/25/2018 8.9 4.0 - 5.6 % Final  ? ?Hgb A1c MFr Bld  ?Date Value Ref Range Status  ?06/06/2021 8.1 (H) <5.7 % of total Hgb Final  ?  Comment:  ?  For someone without known diabetes, a hemoglobin A1c ?value of 6.5% or greater indicates that they may have  ?diabetes and this should be confirmed with a follow-up  ?test. ?. ?For someone with known diabetes, a value <7% indicates  ?that their diabetes is well controlled and a value  ?greater than or equal to 7% indicates suboptimal  ?control. A1c targets should be individualized based on  ?duration of diabetes, age, comorbid conditions, and  ?other considerations. ?. ?Currently, no consensus exists regarding use of ?hemoglobin A1c for diagnosis of diabetes for children. ?. ?  ?  ?  ?  ?  Failed - Cr in normal range and within 360 days  ?  Creat  ?Date Value Ref Range Status  ?06/06/2021 1.85 (H) 0.70 - 1.28 mg/dL Final  ? ?Creatinine, Urine  ?Date Value Ref Range Status  ?03/16/2020 113 20 - 320 mg/dL Final  ?  ?  ?  ?  Passed -  Valid encounter within last 6 months  ?  Recent Outpatient Visits   ? ?      ? 1 week ago DDD (degenerative disc disease), lumbar  ? [place], FNP  ? 1 month ago Pure hypercholesterolemia  ? [facility] Serafina Royals F, FNP  ? 2 months ago Recurrent major depressive disorder, in partial remission ([place])  ? [facility] Serafina Royals F, FNP  ? 3 months ago Major depressive disorder, recurrent episode, moderate ([place])  ? [place], NP  ? 5 months ago Need for influenza vaccination  ? [facility] Kathrine Haddock, NP  ? ?  ?  ?Future Appointments   ? ?        ? In 1 month Reece Packer, Myna Hidalgo, [facility] [place]  ? In 5 months Hollice Espy, MD [place]  ? In 8 months  [place]  ? ?  ? ?  ?  ?  ? ? ?

## 2021-08-05 DIAGNOSIS — I10 Essential (primary) hypertension: Secondary | ICD-10-CM | POA: Diagnosis not present

## 2021-08-05 DIAGNOSIS — S0990XA Unspecified injury of head, initial encounter: Secondary | ICD-10-CM | POA: Diagnosis not present

## 2021-08-05 DIAGNOSIS — R55 Syncope and collapse: Secondary | ICD-10-CM | POA: Diagnosis not present

## 2021-08-05 DIAGNOSIS — R42 Dizziness and giddiness: Secondary | ICD-10-CM | POA: Diagnosis not present

## 2021-08-05 DIAGNOSIS — E1165 Type 2 diabetes mellitus with hyperglycemia: Secondary | ICD-10-CM | POA: Diagnosis not present

## 2021-08-05 DIAGNOSIS — R739 Hyperglycemia, unspecified: Secondary | ICD-10-CM | POA: Diagnosis not present

## 2021-08-06 DIAGNOSIS — R55 Syncope and collapse: Secondary | ICD-10-CM | POA: Diagnosis not present

## 2021-08-06 DIAGNOSIS — R42 Dizziness and giddiness: Secondary | ICD-10-CM | POA: Diagnosis not present

## 2021-08-10 ENCOUNTER — Telehealth: Payer: Self-pay

## 2021-08-10 NOTE — Telephone Encounter (Signed)
Called and LVM asking for patient to please return my call. Patient is coming up on our reports but has not been seen here. Looks as if patient sees Serafina Royals, FNP at [facility].  ? ?OK for the [facility] to find out and confirm the above with the patient if he calls back.  ?

## 2021-08-11 NOTE — Telephone Encounter (Signed)
Called and LVM asking for patient to please return my call.  

## 2021-08-15 NOTE — Telephone Encounter (Signed)
Called and spoke to the patient. Confirmed that he is not a patient here at [facility]. States he sees Serafina Royals, FNP at [facility].  ? ?Reattribution spreadsheet updated with the above information.  ?

## 2021-08-16 DIAGNOSIS — E875 Hyperkalemia: Secondary | ICD-10-CM | POA: Diagnosis not present

## 2021-08-16 DIAGNOSIS — I10 Essential (primary) hypertension: Secondary | ICD-10-CM | POA: Diagnosis not present

## 2021-08-16 DIAGNOSIS — N182 Chronic kidney disease, stage 2 (mild): Secondary | ICD-10-CM | POA: Diagnosis not present

## 2021-08-16 DIAGNOSIS — E1122 Type 2 diabetes mellitus with diabetic chronic kidney disease: Secondary | ICD-10-CM | POA: Diagnosis not present

## 2021-08-18 DIAGNOSIS — I10 Essential (primary) hypertension: Secondary | ICD-10-CM | POA: Diagnosis not present

## 2021-08-18 DIAGNOSIS — E875 Hyperkalemia: Secondary | ICD-10-CM | POA: Diagnosis not present

## 2021-08-18 DIAGNOSIS — N1831 Chronic kidney disease, stage 3a: Secondary | ICD-10-CM | POA: Diagnosis not present

## 2021-08-18 DIAGNOSIS — E1122 Type 2 diabetes mellitus with diabetic chronic kidney disease: Secondary | ICD-10-CM | POA: Diagnosis not present

## 2021-08-22 ENCOUNTER — Telehealth: Payer: Self-pay

## 2021-08-22 NOTE — Telephone Encounter (Unsigned)
Copied from [place] [phone]. Topic: General - Other >> Aug 22, 2021  1:39 PM Yvette Rack wrote: Reason for CRM: Pt stated [facility] is sending him an application for [employer]. Pt requests that Cristie Hem return his call to discuss. Cb# [phone].

## 2021-08-25 ENCOUNTER — Ambulatory Visit: Payer: Medicare Other | Admitting: Dermatology

## 2021-08-25 DIAGNOSIS — Z5181 Encounter for therapeutic drug level monitoring: Secondary | ICD-10-CM

## 2021-08-25 DIAGNOSIS — Z1283 Encounter for screening for malignant neoplasm of skin: Secondary | ICD-10-CM

## 2021-08-25 DIAGNOSIS — L820 Inflamed seborrheic keratosis: Secondary | ICD-10-CM | POA: Diagnosis not present

## 2021-08-25 DIAGNOSIS — L814 Other melanin hyperpigmentation: Secondary | ICD-10-CM

## 2021-08-25 DIAGNOSIS — C44311 Basal cell carcinoma of skin of nose: Secondary | ICD-10-CM | POA: Diagnosis not present

## 2021-08-25 DIAGNOSIS — L578 Other skin changes due to chronic exposure to nonionizing radiation: Secondary | ICD-10-CM

## 2021-08-25 DIAGNOSIS — D489 Neoplasm of uncertain behavior, unspecified: Secondary | ICD-10-CM

## 2021-08-25 DIAGNOSIS — D1800 Hemangioma unspecified site: Secondary | ICD-10-CM

## 2021-08-25 DIAGNOSIS — D229 Melanocytic nevi, unspecified: Secondary | ICD-10-CM

## 2021-08-25 DIAGNOSIS — B353 Tinea pedis: Secondary | ICD-10-CM

## 2021-08-25 DIAGNOSIS — L821 Other seborrheic keratosis: Secondary | ICD-10-CM

## 2021-08-25 DIAGNOSIS — Z85828 Personal history of other malignant neoplasm of skin: Secondary | ICD-10-CM

## 2021-08-25 MED ORDER — TERBINAFINE HCL 250 MG PO TABS: 250.0000 mg | ORAL_TABLET | Freq: Every day | ORAL | 0 refills | Status: DC

## 2021-08-25 MED ORDER — KETOCONAZOLE 2 % EX CREA: TOPICAL_CREAM | CUTANEOUS | 6 refills | Status: DC

## 2021-08-25 NOTE — Progress Notes (Signed)
Follow-Up Visit   Subjective  Evan Rivas is a 71 y.o. male who presents for the following: Annual Exam (1 year tbse, dry places at left flank , hx of aks, some spots at nose). The patient presents for Total-Body Skin Exam (TBSE) for skin cancer screening and mole check.  The patient has spots, moles and lesions to be evaluated, some may be new or changing and the patient has concerns that these could be cancer.  Crust on right nose for a year isk photo check later Spot at nasal tip for several months Bx removal  R/o bcc   The following portions of the chart were reviewed this encounter and updated as appropriate:  Tobacco  Allergies  Meds  Problems  Med Hx  Surg Hx  Fam Hx      Review of Systems: No other skin or systemic complaints except as noted in HPI or Assessment and Plan.  Objective  Well appearing patient in no apparent distress; mood and affect are within normal limits.  A full examination was performed including scalp, head, eyes, ears, nose, lips, neck, chest, axillae, abdomen, back, buttocks, bilateral upper extremities, bilateral lower extremities, hands, feet, fingers, toes, fingernails, and toenails. All findings within normal limits unless otherwise noted below.  right nose x 1 Erythematous stuck-on, waxy papule or plaque  nasal tip 1.1 cm pink patch          b/l feet Scaling and maceration web spaces and over distal and lateral soles.    Assessment & Plan  Inflamed seborrheic keratosis right nose x 1 Will recheck at next follow up -if persistent consider biopsy Destruction of lesion - right nose x 1 Complexity: simple   Destruction method: cryotherapy   Informed consent: discussed and consent obtained   Timeout:  patient name, date of birth, surgical site, and procedure verified Lesion destroyed using liquid nitrogen: Yes   Region frozen until ice ball extended beyond lesion: Yes   Outcome: patient tolerated procedure well with no  complications   Post-procedure details: wound care instructions given   Additional details:  Prior to procedure, discussed risks of blister formation, small wound, skin dyspigmentation, or rare scar following cryotherapy. Recommend Vaseline ointment to treated areas while healing.  Neoplasm of uncertain behavior nasal tip Skin / nail biopsy Type of biopsy: tangential   Informed consent: discussed and consent obtained   Timeout: patient name, date of birth, surgical site, and procedure verified   Procedure prep:  Patient was prepped and draped in usual sterile fashion Prep type:  Isopropyl alcohol Anesthesia: the lesion was anesthetized in a standard fashion   Anesthetic:  1% lidocaine w/ epinephrine 1-100,000 buffered w/ 8.4% NaHCO3 Instrument used: flexible razor blade   Hemostasis achieved with: pressure, aluminum chloride and electrodesiccation   Outcome: patient tolerated procedure well   Post-procedure details: sterile dressing applied and wound care instructions given   Dressing type: petrolatum and bandage    Specimen 1 - Surgical pathology Differential Diagnosis: r/o bcc  Check Margins: No R/o BCC  Tinea pedis of both feet b/l feet Chronic and persistent condition with duration or expected duration over one year. Condition is symptomatic / bothersome to patient. Not to goal. Continue  Ketoconazole 2% cream QHS.   Patient denies hx of liver problems  Reviewed labs cmp  from 3 / 23 normal results  Start Terbinafine 250 mg tablet - take 1 tab by mouth qd  Will recheck in 6 - 8 weeks  Terbinafine Counseling  Terbinafine is an anti-fungal medicine that can be applied to the skin (over the counter) or taken by mouth (prescription) to treat fungal infections. The pill version is often used to treat fungal infections of the nails or scalp. While most people do not have any side effects from taking terbinafine pills, some possible side effects of the medicine can include taste  changes, headache, loss of smell, vision changes, nausea, vomiting, or diarrhea.   Rare side effects can include irritation of the liver, allergic reaction, or decrease in blood counts (which may show up as not feeling well or developing an infection). If you are concerned about any of these side effects, please stop the medicine and call your doctor, or in the case of an emergency such as feeling very unwell, seek immediate medical care.  Patient denies hx of liver problems  Reviewed labs cmp and cbc from 3 / 23 normal results  Start  terbinafine (LAMISIL) 250 MG tablet - b/l feet Take 1 tablet (250 mg total) by mouth daily. For tinea pedis  Skin cancer screening  Lentigines - Scattered tan macules - Due to sun exposure - Benign-appearing, observe - Recommend daily broad spectrum sunscreen SPF 30+ to sun-exposed areas, reapply every 2 hours as needed. - Call for any changes  Seborrheic Keratoses - Stuck-on, waxy, tan-brown papules and/or plaques  - Benign-appearing - Discussed benign etiology and prognosis. - Observe - Call for any changes  Melanocytic Nevi - Tan-brown and/or pink-flesh-colored symmetric macules and papules - Benign appearing on exam today - Observation - Call clinic for new or changing moles - Recommend daily use of broad spectrum spf 30+ sunscreen to sun-exposed areas.   Hemangiomas - Red papules - Discussed benign nature - Observe - Call for any changes  Actinic Damage - Chronic condition, secondary to cumulative UV/sun exposure - diffuse scaly erythematous macules with underlying dyspigmentation - Recommend daily broad spectrum sunscreen SPF 30+ to sun-exposed areas, reapply every 2 hours as needed.  - Staying in the shade or wearing long sleeves, sun glasses (UVA+UVB protection) and wide brim hats (4-inch brim around the entire circumference of the hat) are also recommended for sun protection.  - Call for new or changing lesions.  History of Basal  Cell Carcinoma of the Skin - No evidence of recurrence today right upper lip (2015) - Recommend regular full body skin exams - Recommend daily broad spectrum sunscreen SPF 30+ to sun-exposed areas, reapply every 2 hours as needed.  - Call if any new or changing lesions are noted between office visits  Skin cancer screening performed today. Return for 6 - 8  week tinea pedis and isk followup, 1 year tbse . IRuthell Rummage, CMA, am acting as scribe for Sarina Ser, MD. Documentation: I have reviewed the above documentation for accuracy and completeness, and I agree with the above.  Sarina Ser, MD

## 2021-08-25 NOTE — Patient Instructions (Signed)
Biopsy Wound Care Instructions  Leave the original bandage on for 24 hours if possible.  If the bandage becomes soaked or soiled before that time, it is OK to remove it and examine the wound.  A small amount of post-operative bleeding is normal.  If excessive bleeding occurs, remove the bandage, place gauze over the site and apply continuous pressure (no peeking) over the area for 30 minutes. If this does not work, please call our clinic as soon as possible or page your doctor if it is after hours.   Once a day, cleanse the wound with soap and water. It is fine to shower. If a thick crust develops you may use a Q-tip dipped into dilute hydrogen peroxide (mix 1:1 with water) to dissolve it.  Hydrogen peroxide can slow the healing process, so use it only as needed.    After washing, apply petroleum jelly (Vaseline) or an antibiotic ointment if your doctor prescribed one for you, followed by a bandage.    For best healing, the wound should be covered with a layer of ointment at all times. If you are not able to keep the area covered with a bandage to hold the ointment in place, this may mean re-applying the ointment several times a day.  Continue this wound care until the wound has healed and is no longer open.   Itching and mild discomfort is normal during the healing process. However, if you develop pain or severe itching, please call our office.   If you have any discomfort, you can take Tylenol (acetaminophen) or ibuprofen as directed on the bottle. (Please do not take these if you have an allergy to them or cannot take them for another reason).  Some redness, tenderness and white or yellow material in the wound is normal healing.  If the area becomes very sore and red, or develops a thick yellow-green material (pus), it may be infected; please notify us.    If you have stitches, return to clinic as directed to have the stitches removed. You will continue wound care for 2-3 days after the stitches  are removed.   Wound healing continues for up to one year following surgery. It is not unusual to experience pain in the scar from time to time during the interval.  If the pain becomes severe or the scar thickens, you should notify the office.    A slight amount of redness in a scar is expected for the first six months.  After six months, the redness will fade and the scar will soften and fade.  The color difference becomes less noticeable with time.  If there are any problems, return for a post-op surgery check at your earliest convenience.  To improve the appearance of the scar, you can use silicone scar gel, cream, or sheets (such as Mederma or Serica) every night for up to one year. These are available over the counter (without a prescription).  Please call our office at [phone] for any questions or concerns.    Seborrheic Keratosis  What causes seborrheic keratoses? Seborrheic keratoses are harmless, common skin growths that first appear during adult life.  As time goes by, more growths appear.  Some people may develop a large number of them.  Seborrheic keratoses appear on both covered and uncovered body parts.  They are not caused by sunlight.  The tendency to develop seborrheic keratoses can be inherited.  They vary in color from skin-colored to gray, brown, or even black.  They can  be either smooth or have a rough, warty surface.   Seborrheic keratoses are superficial and look as if they were stuck on the skin.  Under the microscope this type of keratosis looks like layers upon layers of skin.  That is why at times the top layer may seem to fall off, but the rest of the growth remains and re-grows.    Treatment Seborrheic keratoses do not need to be treated, but can easily be removed in the office.  Seborrheic keratoses often cause symptoms when they rub on clothing or jewelry.  Lesions can be in the way of shaving.  If they become inflamed, they can cause itching, soreness, or  burning.  Removal of a seborrheic keratosis can be accomplished by freezing, burning, or surgery. If any spot bleeds, scabs, or grows rapidly, please return to have it checked, as these can be an indication of a skin cancer.  Cryotherapy Aftercare  Wash gently with soap and water everyday.   Apply Vaseline and Band-Aid daily until healed.   Melanoma ABCDEs  Melanoma is the most dangerous type of skin cancer, and is the leading cause of death from skin disease.  You are more likely to develop melanoma if you: Have light-colored skin, light-colored eyes, or red or blond hair Spend a lot of time in the sun Tan regularly, either outdoors or in a tanning bed Have had blistering sunburns, especially during childhood Have a close family member who has had a melanoma Have atypical moles or large birthmarks  Early detection of melanoma is key since treatment is typically straightforward and cure rates are extremely high if we catch it early.   The first sign of melanoma is often a change in a mole or a new dark spot.  The ABCDE system is a way of remembering the signs of melanoma.  A for asymmetry:  The two halves do not match. B for border:  The edges of the growth are irregular. C for color:  A mixture of colors are present instead of an even brown color. D for diameter:  Melanomas are usually (but not always) greater than 63m - the size of a pencil eraser. E for evolution:  The spot keeps changing in size, shape, and color.  Please check your skin once per month between visits. You can use a small mirror in front and a large mirror behind you to keep an eye on the back side or your body.   If you see any new or changing lesions before your next follow-up, please call to schedule a visit.  Please continue daily skin protection including broad spectrum sunscreen SPF 30+ to sun-exposed areas, reapplying every 2 hours as needed when you're outdoors.    If You Need Anything After Your  Visit  If you have any questions or concerns for your doctor, please call our main line at [phone] press option 4 to reach your doctor's medical assistant. If no one answers, please leave a voicemail as directed and we will return your call as soon as possible. Messages left after 4 pm will be answered the following business day.   You may also send [place] message via [place] We typically respond to MyChart messages within 1-2 business days.  For prescription refills, please ask your pharmacy to contact our office. Our fax number is [phone]  If you have an urgent issue when the clinic is closed that cannot wait until the next business day, you can page your doctor at  the number below.    Please note that while we do our best to be available for urgent issues outside of office hours, we are not available 24/7.   If you have an urgent issue and are unable to reach [place], you may choose to seek medical care at your doctor's office, retail clinic, urgent care center, or emergency room.  If you have a medical emergency, please immediately call 911 or go to the emergency department.  Pager Numbers  - Dr. Nehemiah Massed: [phone]  - Dr. Laurence Ferrari: [phone]  - Dr. Nicole Kindred: [phone]  In the event of inclement weather, please call our main line at [phone] for an update on the status of any delays or closures.  Dermatology Medication Tips: Please keep the boxes that topical medications come in in order to help keep track of the instructions about where and how to use these. Pharmacies typically print the medication instructions only on the boxes and not directly on the medication tubes.   If your medication is too expensive, please contact our office at [phone] option 4 or send [place] a message through [place].   We are unable to tell what your co-pay for medications will be in advance as this is different depending on your insurance coverage. However, we may be able to find a  substitute medication at lower cost or fill out paperwork to get insurance to cover a needed medication.   If a prior authorization is required to get your medication covered by your insurance company, please allow [place] 1-2 business days to complete this process.  Drug prices often vary depending on where the prescription is filled and some pharmacies may offer cheaper prices.  The website www.goodrx.com contains coupons for medications through different pharmacies. The prices here do not account for what the cost may be with help from insurance (it may be cheaper with your insurance), but the website can give you the price if you did not use any insurance.  - You can print the associated coupon and take it with your prescription to the pharmacy.  - You may also stop by our office during regular business hours and pick up a GoodRx coupon card.  - If you need your prescription sent electronically to a different pharmacy, notify our office through [facility] or by phone at [phone] option 4.     Si Usted Necesita Algo Despus de Su Visita  Tambin puede enviarnos un mensaje a travs de Pharmacist, community. Por lo general respondemos a los mensajes de MyChart en el transcurso de 1 a 2 das hbiles.  Para renovar recetas, por favor pida a su farmacia que se ponga en contacto con nuestra oficina. Harland Dingwall de fax es [place] [phone].  Si tiene un asunto urgente cuando la clnica est cerrada y que no puede esperar hasta el siguiente da hbil, puede llamar/localizar a su doctor(a) al nmero que aparece a continuacin.   Por favor, tenga en cuenta que aunque hacemos todo lo posible para estar disponibles para asuntos urgentes fuera del horario de [place], no estamos disponibles las 24 horas del da, los 7 das de la [place].   Si tiene un problema urgente y no puede comunicarse con nosotros, puede optar por buscar atencin mdica  en el consultorio de su doctor(a), en una clnica privada, en un  centro de atencin urgente o en una sala de emergencias.  Si tiene Engineering geologist, por favor llame inmediatamente al 911 o vaya a la sala de emergencias.  Nmeros  de bper  - Dr. Nehemiah Massed: [phone]  - Dra. Moye: [phone]  - Dra. Nicole Kindred: [phone]  En caso de inclemencias del [place], por favor llame a Johnsie Kindred principal al [phone] para una actualizacin sobre el [place] de cualquier retraso o cierre.  Consejos para la medicacin en dermatologa: Por favor, guarde las cajas en las que vienen los medicamentos de uso tpico para ayudarle a seguir las instrucciones sobre dnde y cmo usarlos. Las farmacias generalmente imprimen las instrucciones del medicamento slo en las cajas y no directamente en los tubos del [place].   Si su medicamento es muy caro, por favor, pngase en contacto con Zigmund Daniel llamando al [phone] y presione la opcin 4 o envenos un mensaje a travs de Pharmacist, community.   No podemos decirle cul ser su copago por los medicamentos por adelantado ya que esto es diferente dependiendo de la cobertura de su seguro. Sin embargo, es posible que podamos encontrar un medicamento sustituto a Electrical engineer un formulario para que el seguro cubra el medicamento que se considera necesario.   Si se requiere una autorizacin previa para que su compaa de seguros [place] su medicamento, por favor permtanos de 1 a 2 das hbiles para completar este proceso.  Los precios de los medicamentos varan con frecuencia dependiendo del Environmental consultant de dnde se surte la receta y alguna farmacias pueden ofrecer precios ms baratos.  El sitio web www.goodrx.com tiene cupones para medicamentos de Airline pilot. Los precios aqu no tienen en cuenta lo que podra costar con la ayuda del seguro (puede ser ms barato con su seguro), pero el sitio web puede darle el precio si no utiliz Research scientist (physical sciences).  - Puede imprimir el cupn correspondiente y llevarlo con su receta  a la farmacia.  - Tambin puede pasar por nuestra oficina durante el horario de atencin regular y Charity fundraiser una tarjeta de cupones de GoodRx.  - Si necesita que su receta se enve electrnicamente a una farmacia diferente, informe a nuestra oficina a travs de MyChart de [place] o por telfono llamando al [phone] y presione la opcin 4.

## 2021-08-29 ENCOUNTER — Encounter: Payer: Self-pay | Admitting: Dermatology

## 2021-08-30 ENCOUNTER — Telehealth: Payer: Self-pay

## 2021-08-30 DIAGNOSIS — C44311 Basal cell carcinoma of skin of nose: Secondary | ICD-10-CM

## 2021-08-30 NOTE — Telephone Encounter (Signed)
Patient informed of pathology results and referral e-mailed to Dr. Jonathon Jordan office.

## 2021-08-30 NOTE — Telephone Encounter (Signed)
-----   Message from Ralene Bathe, MD sent at 08/26/2021  8:00 PM EDT ----- Diagnosis Skin , nasal tip BASAL CELL CARCINOMA, NODULAR AND INFILTRATIVE PATTERNS, BASE INVOLVED  Cancer - BCC Schedule MOHS

## 2021-08-31 ENCOUNTER — Telehealth: Payer: Self-pay

## 2021-08-31 NOTE — Progress Notes (Signed)
Chronic Care Management Pharmacy Assistant   Name: Evan Rivas  MRN: [id number] DOB: Sep 02, 1950  Patient Assistance Application  I spoke with the patient regarding his renewal application for his [employer]. Patient is no longer part of the CCM program with [name], CPP, but I informed patient that once he completes the form that was mailed to him to make sure he provides proof of income. I advised patient he would need to give the form to the provider for her to fill out her portion of the form, and once the form has been completely filled out the providers office can fax the application over to [name] @ [phone] along with his proof of income for processing. I informed patient once application has been faxed it can take up to 48 hours for it to hit [employer] system for them to process. Advised patient he can call [name] on Monday @ [phone] to check to see if they received the fax and make sure nothing else is needed. I encouraged the patient to give me a call if he needs any further assistance with this process.  Patient verbalized understanding to all.   Medications: Outpatient Encounter Medications as of 08/31/2021  Medication Sig   aspirin EC 81 MG tablet Take 81 mg by mouth daily. Swallow whole.   atorvastatin (LIPITOR) 40 MG tablet Take 1 tablet (40 mg total) by mouth at bedtime.   blood glucose meter kit and supplies Dispense based on patient and insurance preference. Use up to four times daily as directed. (FOR ICD-10 E10.9, E11.9).   clotrimazole (LOTRIMIN) 1 % cream Apply 1 application topically 2 (two) times daily. Apply for 7-10 days.   dapagliflozin propanediol (FARXIGA) 10 MG TABS tablet Take 1 tablet (10 mg total) by mouth daily before breakfast. Patient receives via [place] Patient Assistance Program   Dulaglutide (TRULICITY) 1.5 TS/1.7BL SOPN Inject 1.5 mg into the skin once a week.   DULoxetine (CYMBALTA) 60 MG capsule Take 1 capsule  (60 mg total) by mouth daily.   fluticasone (FLONASE) 50 MCG/ACT nasal spray Place 2 sprays into both nostrils daily.   gabapentin (NEURONTIN) 600 MG tablet Take 1 tablet (600 mg total) by mouth 3 (three) times daily.   glipiZIDE (GLUCOTROL) 10 MG tablet TAKE 1 TABLET DAILY BEFORE BREAKFAST   glucose blood (ACCU-CHEK AVIVA PLUS) test strip Use as instructed   HYDROcodone-acetaminophen (NORCO) 10-325 MG tablet Take 0.5-1 tablets by mouth every 8 (eight) hours as needed. Fill 08/02/2021 max #36 for the month - gradually decrease dose   hydrOXYzine (ATARAX/VISTARIL) 25 MG tablet Take 25 mg by mouth 3 (three) times daily as needed for anxiety.    ketoconazole (NIZORAL) 2 % cream Apply to the feet QHS.   melatonin 3 MG TABS tablet Take 3 mg by mouth at bedtime.   metFORMIN (GLUCOPHAGE) 1000 MG tablet Take 1 tablet (1,000 mg total) by mouth 2 (two) times daily with a meal.   metoprolol succinate (TOPROL-XL) 100 MG 24 hr tablet Take 1 tablet (100 mg total) by mouth daily. Take with or immediately following a meal.   Multiple Vitamin (MULTIVITAMIN) tablet Take 1 tablet by mouth daily.   spironolactone (ALDACTONE) 25 MG tablet Take 1 tablet (25 mg total) by mouth 2 (two) times a week. (Patient taking differently: Take 25 mg by mouth 2 (two) times a week. On Wednesdays and Saturdays only.)   terbinafine (LAMISIL) 250 MG tablet Take 1 tablet (250 mg total) by mouth  daily. For tinea pedis   tiZANidine (ZANAFLEX) 4 MG tablet Take 1 tablet (4 mg total) by mouth every 6 (six) hours as needed for muscle spasms.   TRUEplus Lancets 33G MISC    valsartan-hydrochlorothiazide (DIOVAN-HCT) 320-12.5 MG tablet Take 1 tablet by mouth daily.   No facility-administered encounter medications on file as of 08/31/2021.    [name], CPA/CMA Clinical Pharmacist Assistant Phone: [phone]

## 2021-09-01 ENCOUNTER — Ambulatory Visit (INDEPENDENT_AMBULATORY_CARE_PROVIDER_SITE_OTHER): Payer: Medicare Other | Admitting: Nurse Practitioner

## 2021-09-01 ENCOUNTER — Other Ambulatory Visit: Payer: Self-pay

## 2021-09-01 ENCOUNTER — Encounter: Payer: Self-pay | Admitting: Nurse Practitioner

## 2021-09-01 VITALS — BP 124/78 | HR 88 | Temp 97.8°F | Resp 16 | Ht 75.0 in | Wt 254.1 lb

## 2021-09-01 DIAGNOSIS — F1120 Opioid dependence, uncomplicated: Secondary | ICD-10-CM | POA: Diagnosis not present

## 2021-09-01 DIAGNOSIS — G8929 Other chronic pain: Secondary | ICD-10-CM | POA: Diagnosis not present

## 2021-09-01 DIAGNOSIS — M5136 Other intervertebral disc degeneration, lumbar region: Secondary | ICD-10-CM

## 2021-09-01 DIAGNOSIS — M545 Low back pain, unspecified: Secondary | ICD-10-CM | POA: Diagnosis not present

## 2021-09-01 MED ORDER — HYDROCODONE-ACETAMINOPHEN 10-325 MG PO TABS: 0.5000 | ORAL_TABLET | Freq: Three times a day (TID) | ORAL | 0 refills | Status: DC | PRN

## 2021-09-01 MED ORDER — HYDROCODONE-ACETAMINOPHEN 10-325 MG PO TABS
0.5000 | ORAL_TABLET | Freq: Three times a day (TID) | ORAL | 0 refills | Status: DC | PRN
Start: 2021-09-01 — End: 2021-09-15

## 2021-09-01 NOTE — Assessment & Plan Note (Addendum)
Chronic back pain.  Currently working on decreasing hydrocodone use. last month he was on 74 for the month.  However he did experience a fall which caused more back pain.  He would like to continue with the 36 for the next 2 months.

## 2021-09-01 NOTE — Progress Notes (Signed)
BP 124/78   Pulse 88   Temp 97.8 F (36.6 C) (Oral)   Resp 16   Ht _0  (1.905 m)   Wt 254 lb 1.6 oz (115.3 kg)   SpO2 95%   BMI 31.76 kg/m    Subjective:    Patient ID: Evan Rivas, male    DOB: July 25, 1950, 71 y.o.   MRN: [id number]  HPI: Evan Rivas is a 71 y.o. male  Chief Complaint  Patient presents with   Follow-up    2 month recheck   Back pain/DDD/continuous opioid dependence: He is working on being tapered off of his hydrocodone.  He is not seeing pain management.  The taper is 5 to 10% each month.  Last month we did #36 hydrocodone.  He has a current drug test and pain management contract on file renewal is due in September.  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  PDMP checked last fill was 08/02/2021.  He says early last month he went to [place] and was doing well his pain was well controlled.  He says he went golfing with sitting down in chair and had a syncopal episode.  He was seen in the emergency department.  He says that his head CT was negative and his chest x-ray was negative his heart was fine his brain was fine he said the provider told him it was likely because he had not eaten all day but had still taken his diabetes medication.  Patient states he has been fine since.  However he says that since falling his back has been hurting more.  She is requesting to stay on the 36 a month for the next 2 months.  We will continue with this current treatment at next visit we will drop down to 33 a month.  Relevant past medical, surgical, family and social history reviewed and updated as indicated. Interim medical history since our last visit reviewed. Allergies and medications reviewed and updated.  Review of Systems  Constitutional: Negative for fever or weight change.  Respiratory: Negative for cough and shortness of breath.   Cardiovascular: Negative for chest pain or palpitations.  Gastrointestinal: Negative for abdominal pain, no bowel  changes.  Musculoskeletal: Negative for gait problem or joint swelling.  Positive low back pain Skin: Negative for rash.  Neurological: Negative for dizziness or headache.  No other specific complaints in a complete review of systems (except as listed in HPI above).      Objective:    BP 124/78   Pulse 88   Temp 97.8 F (36.6 C) (Oral)   Resp 16   Ht _1  (1.905 m)   Wt 254 lb 1.6 oz (115.3 kg)   SpO2 95%   BMI 31.76 kg/m   Wt Readings from Last 3 Encounters:  09/01/21 254 lb 1.6 oz (115.3 kg)  07/04/21 261 lb 8 oz (118.6 kg)  06/06/21 262 lb 8 oz (119.1 kg)    Physical Exam  Constitutional: Patient appears well-developed and well-nourished. Obese  No distress.  HEENT: head atraumatic, normocephalic, pupils equal and reactive to light,  neck supple, throat within normal limits Cardiovascular: Normal rate, regular rhythm and normal heart sounds.  No murmur heard. No BLE edema. Pulmonary/Chest: Effort normal and breath sounds normal. No respiratory distress. Abdominal: Soft.  There is no tenderness. Psychiatric: Patient has a normal mood and affect. behavior is normal. Judgment and thought content normal.  Results for orders placed or performed in  visit on 06/06/21  Lipid panel  Result Value Ref Range   Cholesterol 131 <200 mg/dL   HDL 30 (L) > OR = 40 mg/dL   Triglycerides 224 (H) <150 mg/dL   LDL Cholesterol (Calc) 70 mg/dL (calc)   Total CHOL/HDL Ratio 4.4 <5.0 (calc)   Non-HDL Cholesterol (Calc) 101 <130 mg/dL (calc)  CBC with Differential/Platelet  Result Value Ref Range   WBC 5.2 3.8 - 10.8 Thousand/uL   RBC 4.23 4.20 - 5.80 Million/uL   Hemoglobin 13.9 13.2 - 17.1 g/dL   HCT 41.5 38.5 - 50.0 %   MCV 98.1 80.0 - 100.0 fL   MCH 32.9 27.0 - 33.0 pg   MCHC 33.5 32.0 - 36.0 g/dL   RDW 13.2 11.0 - 15.0 %   Platelets 211 140 - 400 Thousand/uL   MPV 11.1 7.5 - 12.5 fL   Neutro Abs 2,501 1,500 - 7,800 cells/uL   Lymphs Abs 1,882 850 - 3,900 cells/uL   Absolute  Monocytes 458 200 - 950 cells/uL   Eosinophils Absolute 307 15 - 500 cells/uL   Basophils Absolute 52 0 - 200 cells/uL   Neutrophils Relative % 48.1 %   Total Lymphocyte 36.2 %   Monocytes Relative 8.8 %   Eosinophils Relative 5.9 %   Basophils Relative 1.0 %  COMPLETE METABOLIC PANEL WITH GFR  Result Value Ref Range   Glucose, Bld 266 (H) 65 - 99 mg/dL   BUN 37 (H) 7 - 25 mg/dL   Creat 1.85 (H) 0.70 - 1.28 mg/dL   eGFR 39 (L) > OR = 60 mL/min/1.46m   BUN/Creatinine Ratio 20 6 - 22 (calc)   Sodium 142 135 - 146 mmol/L   Potassium 5.2 3.5 - 5.3 mmol/L   Chloride 105 98 - 110 mmol/L   CO2 28 20 - 32 mmol/L   Calcium 9.4 8.6 - 10.3 mg/dL   Total Protein 6.4 6.1 - 8.1 g/dL   Albumin 4.0 3.6 - 5.1 g/dL   Globulin 2.4 1.9 - 3.7 g/dL (calc)   AG Ratio 1.7 1.0 - 2.5 (calc)   Total Bilirubin 0.5 0.2 - 1.2 mg/dL   Alkaline phosphatase (APISO) 86 35 - 144 U/L   AST 12 10 - 35 U/L   ALT 14 9 - 46 U/L  Hemoglobin A1c  Result Value Ref Range   Hgb A1c MFr Bld 8.1 (H) <5.7 % of total Hgb   Mean Plasma Glucose 186 mg/dL   eAG (mmol/L) 10.3 mmol/L      Assessment & Plan:   Problem List Items Addressed This Visit       Musculoskeletal and Integument   DDD (degenerative disc disease), lumbar - Primary    Chronic back pain.  Currently working on decreasing hydrocodone use. last month he was on 312for the month.  However he did experience a fall which caused more back pain.  He would like to continue with the 36 for the next 2 months.       Relevant Medications   HYDROcodone-acetaminophen (NORCO) 10-325 MG tablet   HYDROcodone-acetaminophen (NORCO) 10-325 MG tablet     Other   Continuous opioid dependence (HCC)    Chronic back pain.  Currently working on decreasing hydrocodone use. last month he was on 374for the month.  However he did experience a fall which caused more back pain.  He would like to continue with the 36 for the next 2 months.      Relevant Medications  HYDROcodone-acetaminophen (NORCO) 10-325 MG tablet   HYDROcodone-acetaminophen (NORCO) 10-325 MG tablet   Chronic low back pain    Chronic back pain.  Currently working on decreasing hydrocodone use. last month he was on 70 for the month.  However he did experience a fall which caused more back pain.  He would like to continue with the 36 for the next 2 months.      Relevant Medications   HYDROcodone-acetaminophen (NORCO) 10-325 MG tablet   HYDROcodone-acetaminophen (NORCO) 10-325 MG tablet     Follow up plan: Return in about 2 months (around 11/01/2021) for follow up.

## 2021-09-01 NOTE — Assessment & Plan Note (Addendum)
Chronic back pain.  Currently working on decreasing hydrocodone use. last month he was on 53 for the month.  However he did experience a fall which caused more back pain.  He would like to continue with the 36 for the next 2 months.

## 2021-09-01 NOTE — Assessment & Plan Note (Addendum)
Chronic back pain.  Currently working on decreasing hydrocodone use. last month he was on 14 for the month.  However he did experience a fall which caused more back pain.  He would like to continue with the 36 for the next 2 months.

## 2021-09-07 ENCOUNTER — Ambulatory Visit: Payer: Self-pay

## 2021-09-07 DIAGNOSIS — E1169 Type 2 diabetes mellitus with other specified complication: Secondary | ICD-10-CM

## 2021-09-07 NOTE — Progress Notes (Signed)
[employer] Patient Assistance form for Trulicity completed by patient and faxed for review on 09/07/21

## 2021-09-13 ENCOUNTER — Telehealth: Payer: Self-pay

## 2021-09-13 NOTE — Progress Notes (Cosign Needed)
Patient assistance application update for Trulicity:  I reach out to [employer] at [phone] to check the status of patient application for Trulicity.Per [employer], patient was approved to receive Trulicity, and shipment will arrive from 10-14 business days.  [place] Pharmacist Assistant [phone]

## 2021-09-15 ENCOUNTER — Encounter: Payer: Self-pay | Admitting: Physician Assistant

## 2021-09-15 ENCOUNTER — Ambulatory Visit: Payer: Medicare Other | Admitting: Cardiology

## 2021-09-15 VITALS — BP 124/70 | HR 68 | Ht 75.0 in | Wt 250.8 lb

## 2021-09-15 DIAGNOSIS — E269 Hyperaldosteronism, unspecified: Secondary | ICD-10-CM | POA: Diagnosis not present

## 2021-09-15 DIAGNOSIS — I5189 Other ill-defined heart diseases: Secondary | ICD-10-CM | POA: Diagnosis not present

## 2021-09-15 DIAGNOSIS — R55 Syncope and collapse: Secondary | ICD-10-CM | POA: Diagnosis not present

## 2021-09-15 DIAGNOSIS — E782 Mixed hyperlipidemia: Secondary | ICD-10-CM

## 2021-09-15 DIAGNOSIS — N183 Chronic kidney disease, stage 3 unspecified: Secondary | ICD-10-CM

## 2021-09-15 DIAGNOSIS — E1122 Type 2 diabetes mellitus with diabetic chronic kidney disease: Secondary | ICD-10-CM

## 2021-09-15 DIAGNOSIS — I700 Atherosclerosis of aorta: Secondary | ICD-10-CM | POA: Diagnosis not present

## 2021-09-15 DIAGNOSIS — N1832 Chronic kidney disease, stage 3b: Secondary | ICD-10-CM

## 2021-09-15 DIAGNOSIS — I159 Secondary hypertension, unspecified: Secondary | ICD-10-CM

## 2021-09-15 NOTE — Patient Instructions (Signed)
Medication Instructions:  No changes at this time.   *If you need a refill on your cardiac medications before your next appointment, please call your pharmacy*   Lab Work: None  If you have labs (blood work) drawn today and your tests are completely normal, you will receive your results only by: MyChart Message (if you have MyChart) OR A paper copy in the mail If you have any lab test that is abnormal or we need to change your treatment, we will call you to review the results.   Testing/Procedures: None   Follow-Up: At [facility], you and your health needs are our priority.  As part of our continuing mission to provide you with exceptional heart care, we have created designated Provider Care Teams.  These Care Teams include your primary Cardiologist (physician) and Advanced Practice Providers (APPs -  Physician Assistants and Nurse Practitioners) who all work together to provide you with the care you need, when you need it.   Your next appointment:   1 year(s)  The format for your next appointment:   In Person  Provider:   Christopher End, MD or Ryan Dunn, PA-C      Important Information About Sugar       

## 2021-09-15 NOTE — Progress Notes (Signed)
Cardiology Clinic Note   Patient Name: Evan Rivas Date of Encounter: 09/15/2021  Primary Care Provider:  Bo Rivas, [place] Primary Cardiologist:  Evan Bush, MD  Patient Profile    Evan Rivas is a 71 year old male with a prior history of essential hypertension, diastolic dysfunction, hyperaldosteronism secondary to adrenal hyperplasia, hyperlipidemia, DM2, cancer of the lip, anxiety , depression, gout, arthritis with chronic pain who presents today for follow-up on his hypertension and a recent emergency department visit for near syncope.  Past Medical History    Past Medical History:  Diagnosis Date   Adrenal hyperplasia ([place])    Anxiety    panic attacks   Arthritis    Basal cell carcinoma 2015   R upper lip - tx with MOHS by Dr. Lacinda Axon   CKD (chronic kidney disease), stage III ([place])    Depression    GERD (gastroesophageal reflux disease)    Gout    Grade II diastolic dysfunction    a.) TTE 02/19/2018 --> LVEF 60-65%, mild LVH, G2DD, mild MR, mildly dilated LA, RV cavity size/wall thickness/systolic function normal   History of kidney stones    Hyperaldosteronism ([place])    a.) secondary to BILATERAL adrenal hyperplasia   Hyperlipidemia    Hypertension    Nerve entrapment syndrome of lower extremity, right 10/24/2019   Pneumonia    2014 and 2018 ( 2018-klebsiella pneumonia    Sleep apnea    T2DM (type 2 diabetes mellitus) ([place])    Past Surgical History:  Procedure Laterality Date   ARTHROSCOPIC REPAIR ACL Left    CIRCUMCISION N/A 01/17/2021   Procedure: CIRCUMCISION ADULT;  Surgeon: Hollice Espy, MD;  Location: [facility];  Service: Urology;  Laterality: N/A;   CYSTOSCOPY W/ URETERAL STENT PLACEMENT Right 09/07/2014   Procedure: CYSTOSCOPY WITH STENT REPLACEMENT;  Surgeon: Hollice Espy, MD;  Location: [facility];  Service: Urology;  Laterality: Right;   CYSTOSCOPY WITH INSERTION OF UROLIFT N/A 11/24/2019   Procedure: CYSTOSCOPY WITH INSERTION OF  UROLIFT;  Surgeon: Hollice Espy, MD;  Location: [facility];  Service: Urology;  Laterality: N/A;   CYSTOSCOPY/URETEROSCOPY/HOLMIUM LASER/STENT PLACEMENT Right 08/17/2014   Procedure: CYSTOSCOPY/URETEROSCOPY//STENT PLACEMENT/ attempt of lithotripsy;  Surgeon: Hollice Espy, MD;  Location: [facility];  Service: Urology;  Laterality: Right;   HERNIA REPAIR     JOINT REPLACEMENT     Bilat knees and right hip   MOHS SURGERY     lip   TOTAL HIP ARTHROPLASTY Right 06/16/2020   Procedure: TOTAL HIP ARTHROPLASTY ANTERIOR APPROACH;  Surgeon: Gaynelle Arabian, MD;  Location: [facility];  Service: Orthopedics;  Laterality: Right;  115mn   TOTAL KNEE ARTHROPLASTY Left 04/24/2016   Procedure: LEFT TOTAL KNEE ARTHROPLASTY;  Surgeon: FGaynelle Arabian MD;  Location: [facility];  Service: Orthopedics;  Laterality: Left;  Adductor Block   TOTAL KNEE ARTHROPLASTY Right 10/23/2016   Procedure: RIGHT TOTAL KNEE ARTHROPLASTY;  Surgeon: AGaynelle Arabian MD;  Location: [facility];  Service: Orthopedics;  Laterality: Right;   URETEROSCOPY WITH HOLMIUM LASER LITHOTRIPSY Right 09/07/2014   Procedure: URETEROSCOPY WITH HOLMIUM LASER LITHOTRIPSY;  Surgeon: AHollice Espy MD;  Location: [facility];  Service: Urology;  Laterality: Right;   VASECTOMY      Allergies  Allergies  Allergen Reactions   Celebrex [Celecoxib]     GI issues   Tetanus Toxoids Other (See Comments)    Unkown   Tetanus-Diphtheria Toxoids Td     Unknown    History of Present Illness  [name] is a 71 year old male with a  history of hypertension, diastolic dysfunction, hyperaldosteronism secondary to adrenal hyperplasia, hyperlipidemia, DM2, CKD IIIa, cancer of the lip, anxiety, depression, gout, arthritis with chronic pain last seen 07/26/2020 by Evan Raring, NP. There were no medications made at that appointment.  Patient was initially evaluated by Evan Rivas in 02/2018 for lower extremity edema. He reported bilateral foot and ankle swelling over the prior  year that improved with leg elevation and compression stockings. He denies any medication changes or other possible causes that may have led to his symptoms and reported stable weights. His blood pressure was 170/80 with a weight of 280 lbs. There was some concern he had dependent edema exacerbated by amlodipine, so his amlodipine was decreased to 5 mg daily and he was started on spirolactone 25 mg daily. Echocardiogram completed on 02/19/2018 showed EF 60-65%, mild LVH, G2DD, mild MR, mildly dilated left atrium, systolic function was normal. Labs showed possible underlying hyperaldosteronism with an elevated aldosterone to renal ratio. CT of the adrenals in 06/2018 revealed no acute abnormalities. Renal vein mapping through [facility] demonstrated bilateral upper adrenal hyperplasia. Patient had spirolactone increased to 50 mg daily with noted improvement in his blood pressure and lower extremity edema. He was seen in clinic 11/27/2018 with blood pressure ranging 130-140/80-90. His EKG at his visit showed a first degree AVB which led to a decrease in his metoprolol 50 mg daily. On 03/19/2019 he continued with exertional dyspnea with strenuous activity, had failed a trial increase of spironolactone due to bump in his creatinine and potassium. Over all was feeling well and had no medications changes made. 07/01/19 had recently had changes of losartan to valsartan and was recommended to continue with current medication regimen. Admitted 09/04/2019 for pyelonephritis. He was treated with antibiotics, A1c of 8.9 and CKD so he had the new addition of Lantus to his medication regimen that has since been stopped by his PCP. 01/28/2020 he was feeling well, but with the decrease in spironolactone his metoprolol was increased back to 100 mg daily. 06/06/2020 he underwent hip arthroplasty without complications. 07/26/2020 he was doing well, no medication changes were made and he was healing from his hip arthroplasty and encouraged CPAP  compliance.  He presents to day for follow-up and over all states that he feels well. He stated that he had one visit to the emergency department for a near syncope episode. He was out of town visiting family, had been outside and going all day, took all of his medications, and did not eat. He became lightheaded and dizzy. His friends and family sent him to the emergency department for evaluation which all testing in the emergency department did not reveal any contributing factors and he was discharged. Since that time he has had no other episodes or similar symptoms. Denies any chest pain, shortness of breath, or lightheadedness. The only symptom he has today is some pain to his hip and leg he thinks is sciatica.  He continues to play golf regularly playing 18 holes on yesterday and continues to travel with his family.  Home Medications    Current Outpatient Medications  Medication Sig Dispense Refill   aspirin EC 81 MG tablet Take 81 mg by mouth daily. Swallow whole.     atorvastatin (LIPITOR) 40 MG tablet Take 1 tablet (40 mg total) by mouth at bedtime. 90 tablet 3   clotrimazole (LOTRIMIN) 1 % cream Apply 1 application topically 2 (two) times daily. Apply for  7-10 days. 28 g 1   dapagliflozin propanediol (FARXIGA) 10 MG TABS tablet Take 1 tablet (10 mg total) by mouth daily before breakfast. Patient receives via [place] Patient Assistance Program 90 tablet 3   Dulaglutide (TRULICITY) 1.5 LY/6.5KP SOPN Inject 1.5 mg into the skin once a week. 6 mL 3   DULoxetine (CYMBALTA) 60 MG capsule Take 1 capsule (60 mg total) by mouth daily. 90 capsule 3   fluticasone (FLONASE) 50 MCG/ACT nasal spray Place 2 sprays into both nostrils daily. 11.1 mL 2   gabapentin (NEURONTIN) 600 MG tablet Take 1 tablet (600 mg total) by mouth 3 (three) times daily. 270 tablet 1   glipiZIDE (GLUCOTROL) 10 MG tablet TAKE 1 TABLET DAILY BEFORE BREAKFAST 90 tablet 3   glucose blood (ACCU-CHEK AVIVA PLUS) test strip Use as  instructed 100 each 12   HYDROcodone-acetaminophen (NORCO) 10-325 MG tablet Take 0.5-1 tablets by mouth every 8 (eight) hours as needed for severe pain. Fill 10/01/2021 max #36 for the month - gradually decrease dose 36 tablet 0   hydrOXYzine (ATARAX/VISTARIL) 25 MG tablet Take 25 mg by mouth 3 (three) times daily as needed for anxiety.      ketoconazole (NIZORAL) 2 % cream Apply to the feet QHS. 60 g 6   melatonin 3 MG TABS tablet Take 3 mg by mouth at bedtime.     metFORMIN (GLUCOPHAGE) 1000 MG tablet Take 1 tablet (1,000 mg total) by mouth 2 (two) times daily with a meal. 180 tablet 1   metoprolol succinate (TOPROL-XL) 100 MG 24 hr tablet Take 1 tablet (100 mg total) by mouth daily. Take with or immediately following a meal. 90 tablet 3   Multiple Vitamin (MULTIVITAMIN) tablet Take 1 tablet by mouth daily.     spironolactone (ALDACTONE) 25 MG tablet Take 1 tablet (25 mg total) by mouth 2 (two) times a week. (Patient taking differently: Take 25 mg by mouth 2 (two) times a week. On Wednesdays and Saturdays only.) 90 tablet 2   terbinafine (LAMISIL) 250 MG tablet Take 1 tablet (250 mg total) by mouth daily. For tinea pedis 30 tablet 0   tiZANidine (ZANAFLEX) 4 MG tablet Take 1 tablet (4 mg total) by mouth every 6 (six) hours as needed for muscle spasms. 270 tablet 1   valsartan-hydrochlorothiazide (DIOVAN-HCT) 320-12.5 MG tablet Take 1 tablet by mouth daily. 90 tablet 3   No current facility-administered medications for this visit.     Family History    Family History  Problem Relation Age of Onset   Hypertension Mother    Cancer Mother    Hyperlipidemia Mother    Arthritis Mother    Stroke Father    Heart disease Father    Obesity Father    Depression Sister    Hypertension Sister    Hyperlipidemia Sister    Learning disabilities Sister    Obesity Sister    Kidney disease Maternal Grandmother    He indicated that his mother is deceased. He indicated that his father is deceased. He  indicated that his sister is alive. He indicated that the status of his maternal grandmother is unknown.  Social History    Social History   Socioeconomic History   Marital status: Married    Spouse name: Hoyle Sauer   Number of children: 1   Years of education: Not on file   Highest education level: Not on file  Occupational History   Occupation: retired  Tobacco Use   Smoking status: Never  Smokeless tobacco: Never  Vaping Use   Vaping Use: Never used  Substance and Sexual Activity   Alcohol use: No   Drug use: No   Sexual activity: Not Currently    Birth control/protection: None  Other Topics Concern   Not on file  Social History Narrative   Not on file   Social Determinants of Health   Financial Resource Strain: Low Risk  (03/10/2021)   Overall Financial Resource Strain (CARDIA)    Difficulty of Paying Living Expenses: Not hard at all  Food Insecurity: No Food Insecurity (03/10/2021)   Hunger Vital Sign    Worried About Running Out of Food in the Last Year: Never true    Ran Out of Food in the Last Year: Never true  Transportation Needs: No Transportation Needs (03/10/2021)   PRAPARE - Hydrologist (Medical): No    Lack of Transportation (Non-Medical): No  Physical Activity: Sufficiently Active (03/10/2021)   Exercise Vital Sign    Days of Exercise per Week: 3 days    Minutes of Exercise per Session: 60 min  Stress: No Stress Concern Present (03/10/2021)   [place]    Feeling of Stress : Not at all  Social Connections: [place] (03/10/2021)   Social Connection and Isolation Panel [NHANES]    Frequency of Communication with Friends and Family: More than three times a week    Frequency of Social Gatherings with Friends and Family: More than three times a week    Attends Religious Services: More than 4 times per year    Active Member of [employer] or Organizations: Yes     Attends Archivist Meetings: 1 to 4 times per year    Marital Status: Married  Human resources officer Violence: Not At Risk (03/10/2021)   Humiliation, Afraid, Rape, and Kick questionnaire    Fear of Current or Ex-Partner: No    Emotionally Abused: No    Physically Abused: No    Sexually Abused: No     Review of Systems    General:  No chills, fever, night sweats or weight changes.  Cardiovascular:  No chest pain, dyspnea on exertion, edema, orthopnea, palpitations, paroxysmal nocturnal dyspnea. Dermatological: No rash, lesions/masses Respiratory: No cough, dyspnea Urologic: No hematuria, dysuria Abdominal:   No nausea, vomiting, diarrhea, bright red blood per rectum, melena, or hematemesis Musculoskeletal: Endorses right hip an leg discomfort on occasion  Neurologic:  No visual changes, wkns, changes in mental status. All other systems reviewed and are otherwise negative except as noted above.     Physical Exam    VS:  BP 124/70   Pulse 68   Ht '6\' 3"'$  (1.905 m)   Wt 250 lb 12.8 oz (113.8 kg)   SpO2 97%   BMI 31.35 kg/m  , BMI Body mass index is 31.35 kg/m.     GEN: Well nourished, well developed, in no acute distress. HEENT: normal. Neck: Supple, no JVD, carotid bruits, or masses. Cardiac: RRR, no murmurs, rubs, or gallops. No clubbing, cyanosis, edema.  Radials/DP/PT 2+ and equal bilaterally.  Respiratory:  Respirations regular and unlabored, clear to auscultation bilaterally. GI: Soft, nontender, nondistended, BS + x 4. MS: no deformity or atrophy.  Skin: warm and dry, no rash. Neuro:  Strength and sensation are intact. Psych: Normal affect.  Accessory Clinical Findings    ECG personally reviewed by me today- Sinus rhythm rate of 68, first degree AVB - No  acute changes  Lab Results  Component Value Date   WBC 5.2 06/06/2021   HGB 13.9 06/06/2021   HCT 41.5 06/06/2021   MCV 98.1 06/06/2021   PLT 211 06/06/2021   Lab Results  Component Value Date    CREATININE 1.85 (H) 06/06/2021   BUN 37 (H) 06/06/2021   NA 142 06/06/2021   K 5.2 06/06/2021   CL 105 06/06/2021   CO2 28 06/06/2021   Lab Results  Component Value Date   ALT 14 06/06/2021   AST 12 06/06/2021   ALKPHOS 51 06/15/2020   BILITOT 0.5 06/06/2021   Lab Results  Component Value Date   CHOL 131 06/06/2021   HDL 30 (L) 06/06/2021   LDLCALC 70 06/06/2021   TRIG 224 (H) 06/06/2021   CHOLHDL 4.4 06/06/2021    Lab Results  Component Value Date   HGBA1C 8.1 (H) 06/06/2021    Assessment & Plan   Near Syncope  -patient was taken to the emergency department, evaluated, and released -he had been outside all day, taking his medications, and had not eaten so the event was  Orthostatic dizziness, his work-up in the emergency department was unremarkable  -no other episodes of dizziness or lightheadedness  2.  Secondary Hypertension due to hyperaldosteronism -blood pressure well controlled today -continue current medications of Toprol 100 mg daily, Valsartan-HCTZ 320-12.5 mg daily -Encouraged to continue to work on weight loss with dietary changes and activity and congratulated on weight loss thus far  3. CKD 3 -creatinine 1.69 08/05/21 --nephrology has recommended stopping his spironolactone  4.Hyperlipidemia -continue atorvastatin 40 mg daily  -LDL 70 06/06/2021   5.DM2 -improving A1c last 8.1 06/06/2021 -continue to be followed by primary care  Disposition patient to follow up in 11-12 months with EKG on return as patient request yearly visits, he has been advised to notify us of any problems he has in to the interim with chest pain, shortness of breath, lightheadedness, or dizziness; as well as to maintain all follow up appointments with his PCP or any other consultants that he sees   Londen Bok, NP 09/15/2021, 11:50 AM

## 2021-09-29 DIAGNOSIS — Z96653 Presence of artificial knee joint, bilateral: Secondary | ICD-10-CM | POA: Diagnosis not present

## 2021-09-29 DIAGNOSIS — Z96651 Presence of right artificial knee joint: Secondary | ICD-10-CM | POA: Diagnosis not present

## 2021-09-29 DIAGNOSIS — Z96641 Presence of right artificial hip joint: Secondary | ICD-10-CM | POA: Diagnosis not present

## 2021-09-29 DIAGNOSIS — M7061 Trochanteric bursitis, right hip: Secondary | ICD-10-CM | POA: Diagnosis not present

## 2021-09-30 DIAGNOSIS — C44311 Basal cell carcinoma of skin of nose: Secondary | ICD-10-CM | POA: Diagnosis not present

## 2021-10-03 ENCOUNTER — Other Ambulatory Visit: Payer: Self-pay | Admitting: Unknown Physician Specialty

## 2021-10-03 DIAGNOSIS — E669 Obesity, unspecified: Secondary | ICD-10-CM

## 2021-10-05 NOTE — Telephone Encounter (Signed)
Requested medication (s) are due for refill today:   Provider to review  and yes for 2 delegated refills  Requested medication (s) are on the active medication list:   Yes for all 3  Future visit scheduled:   Yes in 4 wks   Last ordered: All 3 04/12/2021 for 90 days with 1 refill    Returned due to non delegated refill   Requested Prescriptions  Pending Prescriptions Disp Refills   tiZANidine (ZANAFLEX) 4 MG tablet [Pharmacy Med Name: TIZANIDINE HCL TABS 4MG] 270 tablet 4    Sig: TAKE 1 TABLET EVERY 6 HOURS AS NEEDED FOR MUSCLE SPASMS     Not Delegated - Cardiovascular:  Alpha-2 Agonists - tizanidine Failed - 10/03/2021  7:30 AM      Failed - This refill cannot be delegated      Passed - Valid encounter within last 6 months    Recent Outpatient Visits           1 month ago DDD (degenerative disc disease), lumbar   [facility] Bo Merino, FNP   3 months ago DDD (degenerative disc disease), lumbar   [facility] Bo Merino, FNP   4 months ago Pure hypercholesterolemia   [facility] Bo Merino, FNP   4 months ago Recurrent major depressive disorder, in partial remission [facility])   [facility] Bo Merino, FNP   5 months ago Major depressive disorder, recurrent episode, moderate ([place])   [facility] Kathrine Haddock, NP       Future Appointments             In 1 week Ralene Bathe, MD [place]   In 4 weeks Reece Packer, Myna Hidalgo, [facility], [place]   In 2 months Hollice Espy, MD [place]   In 5 months  [facility], [place]   In 10 months Ralene Bathe, MD [place]             metFORMIN (GLUCOPHAGE) 1000 MG tablet [Pharmacy Med Name: METFORMIN HCL TABS 1000MG] 180 tablet 3    Sig: TAKE 1 TABLET TWICE A DAY WITH A MEAL     Endocrinology:  Diabetes - Biguanides Failed -  10/03/2021  7:30 AM      Failed - Cr in normal range and within 360 days    Creat  Date Value Ref Range Status  06/06/2021 1.85 (H) 0.70 - 1.28 mg/dL Final   Creatinine, Urine  Date Value Ref Range Status  03/16/2020 113 20 - 320 mg/dL Final         Failed - HBA1C is between 0 and 7.9 and within 180 days    HbA1c, POC (prediabetic range)  Date Value Ref Range Status  03/25/2018 8.9 (A) 5.7 - 6.4 % Final   HbA1c, POC (controlled diabetic range)  Date Value Ref Range Status  03/25/2018 8.9 (A) 0.0 - 7.0 % Final   HbA1c POC (<> result, manual entry)  Date Value Ref Range Status  03/25/2018 8.9 4.0 - 5.6 % Final   Hgb A1c MFr Bld  Date Value Ref Range Status  06/06/2021 8.1 (H) <5.7 % of total Hgb Final    Comment:    For someone without known diabetes, a hemoglobin A1c value of 6.5% or greater indicates that they may have  diabetes and this should be confirmed with a follow-up  test. .  For someone with known diabetes, a value <7% indicates  that their diabetes is well controlled and a value  greater than or equal to 7% indicates suboptimal  control. A1c targets should be individualized based on  duration of diabetes, age, comorbid conditions, and  other considerations. . Currently, no consensus exists regarding use of hemoglobin A1c for diagnosis of diabetes for children. .          Failed - eGFR in normal range and within 360 days    GFR, Est African American  Date Value Ref Range Status  03/16/2020 51 (L) > OR = 60 mL/min/1.65m Final   GFR, Est Non African American  Date Value Ref Range Status  03/16/2020 44 (L) > OR = 60 mL/min/1.752mFinal   GFR, Estimated  Date Value Ref Range Status  01/12/2021 53 (L) >60 mL/min Final    Comment:    (NOTE) Calculated using the CKD-EPI Creatinine Equation (2021)    eGFR  Date Value Ref Range Status  06/06/2021 39 (L) > OR = 60 mL/min/1.7331minal    Comment:    The eGFR is based on the CKD-EPI 2021 equation. To  calculate  the new eGFR from a previous Creatinine or Cystatin C result, go to https://www.kidney.org/professionals/ kdoqi/gfr%5Fcalculator          Failed - B12 Level in normal range and within 720 days    No results found for: "VITAMINB12"       Passed - Valid encounter within last 6 months    Recent Outpatient Visits           1 month ago DDD (degenerative disc disease), [facility]   3 months ago DDD (degenerative disc disease), [facility]   4 months ago Pure hypercholesterolemia   [facility]   4 months ago [facility] major [facility]   5 months ago Major depressive disorder, recurrent episode, moderate ([facility] [facility]       Future Appointments             In 1 week KowRalene BatheD [place] 4 weeks PenReece PackerulMyna HidalgoNPK[facility] CenterECT[facility]ortonIn 2 months BraHollice EspyD [place] 5 months  [facility] 10 months KowRalene BatheD AlaPlain[place]thin normal limits and completed in the last 12 months    WBC  Date Value Ref Range Status  06/06/2021 5.2 3.8 - 10.8 Thousand/uL Final   RBC  Date Value Ref Range Status  06/06/2021 4.23 4.20 - 5.80 Million/uL Final   Hemoglobin  Date Value Ref Range Status  06/06/2021 13.9 13.2 - 17.1 g/dL Final  04/24/2017 14.4 13.0 - 17.7 g/dL Final   HCT  Date Value Ref Range Status  06/06/2021 41.5 38.5 - 50.0 % Final   Hematocrit  Date Value Ref Range Status  04/24/2017 42.7 37.5 - 51.0 % Final   MCHC  Date Value Ref Range Status  06/06/2021 33.5 32.0 - 36.0 g/dL Final   [facility] Value Ref Range Status   06/06/2021 32.9  27.0 - 33.0 pg Final   MCV  Date Value Ref Range Status  06/06/2021 98.1 80.0 - 100.0 fL Final  04/24/2017 93 79 - 97 fL Final   No results found for: "PLTCOUNTKUC", "LABPLAT", "POCPLA" RDW  Date Value Ref Range Status  06/06/2021 13.2 11.0 - 15.0 % Final  04/24/2017 14.4 12.3 - 15.4 % Final          gabapentin (NEURONTIN) 600 MG tablet [Pharmacy Med Name: GABAPENTIN TABS 600MG] 270 tablet 3    Sig: TAKE 1 TABLET THREE TIMES A DAY     Neurology: Anticonvulsants - gabapentin Failed - 10/03/2021  7:30 AM      Failed - Cr in normal range and within 360 days    Creat  Date Value Ref Range Status  06/06/2021 1.85 (H) 0.70 - 1.28 mg/dL Final   Creatinine, Urine  Date Value Ref Range Status  03/16/2020 113 20 - 320 mg/dL Final         Passed - Completed PHQ-2 or PHQ-9 in the last 360 days      Passed - Valid encounter within last 12 months    Recent Outpatient Visits           1 month ago DDD (degenerative disc disease), lumbar   [facility] Bo Merino, FNP   3 months ago DDD (degenerative disc disease), lumbar   [facility] Bo Merino, FNP   4 months ago Pure hypercholesterolemia   [facility] Serafina Royals F, FNP   4 months ago Recurrent major depressive disorder, in partial remission [facility])   [facility] Serafina Royals F, FNP   5 months ago Major depressive disorder, recurrent episode, moderate [facility])   [facility] Kathrine Haddock, NP       Future Appointments             In 1 week Ralene Bathe, MD [place]   In 4 weeks Reece Packer, Myna Hidalgo, [facility], [place]   In 2 months Hollice Espy, MD [place]   In 5 months  [facility], [place]   In 10 months Ralene Bathe, MD [place]

## 2021-10-11 ENCOUNTER — Telehealth: Payer: Self-pay

## 2021-10-11 NOTE — Telephone Encounter (Signed)
Specimen tracking and history updated per Mohs pictures

## 2021-10-13 ENCOUNTER — Ambulatory Visit: Payer: Medicare Other | Admitting: Dermatology

## 2021-10-13 DIAGNOSIS — Z79899 Other long term (current) drug therapy: Secondary | ICD-10-CM | POA: Diagnosis not present

## 2021-10-13 DIAGNOSIS — B353 Tinea pedis: Secondary | ICD-10-CM

## 2021-10-13 DIAGNOSIS — L578 Other skin changes due to chronic exposure to nonionizing radiation: Secondary | ICD-10-CM

## 2021-10-13 DIAGNOSIS — Z85828 Personal history of other malignant neoplasm of skin: Secondary | ICD-10-CM | POA: Diagnosis not present

## 2021-10-13 DIAGNOSIS — L820 Inflamed seborrheic keratosis: Secondary | ICD-10-CM | POA: Diagnosis not present

## 2021-10-13 MED ORDER — TERBINAFINE HCL 250 MG PO TABS: 250.0000 mg | ORAL_TABLET | Freq: Every day | ORAL | 1 refills | Status: DC

## 2021-10-13 NOTE — Patient Instructions (Signed)
Cryotherapy Aftercare  Wash gently with soap and water everyday.   Apply Vaseline and Band-Aid daily until healed.     Due to recent changes in healthcare laws, you may see results of your pathology and/or laboratory studies on MyChart before the doctors have had a chance to review them. We understand that in some cases there may be results that are confusing or concerning to you. Please understand that not all results are received at the same time and often the doctors may need to interpret multiple results in order to provide you with the best plan of care or course of treatment. Therefore, we ask that you please give us 2 business days to thoroughly review all your results before contacting the office for clarification. Should we see a critical lab result, you will be contacted sooner.   If You Need Anything After Your Visit  If you have any questions or concerns for your doctor, please call our main line at [phone] and press option 4 to reach your doctor's medical assistant. If no one answers, please leave a voicemail as directed and we will return your call as soon as possible. Messages left after 4 pm will be answered the following business day.   You may also send us a message via MyChart. We typically respond to MyChart messages within 1-2 business days.  For prescription refills, please ask your pharmacy to contact our office. Our fax number is [phone].  If you have an urgent issue when the clinic is closed that cannot wait until the next business day, you can page your doctor at the number below.    Please note that while we do our best to be available for urgent issues outside of office hours, we are not available 24/7.   If you have an urgent issue and are unable to reach us, you may choose to seek medical care at your doctor's office, retail clinic, urgent care center, or emergency room.  If you have a medical emergency, please immediately call 911 or go to the  emergency department.  Pager Numbers  - Dr. Kowalski: [phone]  - Dr. Moye: [phone]  - Dr. Stewart: [phone]  In the event of inclement weather, please call our main line at [phone] for an update on the status of any delays or closures.  Dermatology Medication Tips: Please keep the boxes that topical medications come in in order to help keep track of the instructions about where and how to use these. Pharmacies typically print the medication instructions only on the boxes and not directly on the medication tubes.   If your medication is too expensive, please contact our office at [phone] option 4 or send us a message through MyChart.   We are unable to tell what your co-pay for medications will be in advance as this is different depending on your insurance coverage. However, we may be able to find a substitute medication at lower cost or fill out paperwork to get insurance to cover a needed medication.   If a prior authorization is required to get your medication covered by your insurance company, please allow us 1-2 business days to complete this process.  Drug prices often vary depending on where the prescription is filled and some pharmacies may offer cheaper prices.  The website www.goodrx.com contains coupons for medications through different pharmacies. The prices here do not account for what the cost may be with help from insurance (it may be cheaper with your insurance), but the website can   give you the price if you did not use any insurance.  - You can print the associated coupon and take it with your prescription to the pharmacy.  - You may also stop by our office during regular business hours and pick up a GoodRx coupon card.  - If you need your prescription sent electronically to a different pharmacy, notify our office through [place] MyChart or by phone at [phone] option 4.     Si Usted Necesita Algo Despus de Su Visita  Tambin puede  enviarnos un mensaje a travs de MyChart. Por lo general respondemos a los mensajes de MyChart en el transcurso de 1 a 2 das hbiles.  Para renovar recetas, por favor pida a su farmacia que se ponga en contacto con nuestra oficina. Nuestro nmero de fax es el [phone].  Si tiene un asunto urgente cuando la clnica est cerrada y que no puede esperar hasta el siguiente da hbil, puede llamar/localizar a su doctor(a) al nmero que aparece a continuacin.   Por favor, tenga en cuenta que aunque hacemos todo lo posible para estar disponibles para asuntos urgentes fuera del horario de oficina, no estamos disponibles las 24 horas del da, los 7 das de la semana.   Si tiene un problema urgente y no puede comunicarse con nosotros, puede optar por buscar atencin mdica  en el consultorio de su doctor(a), en una clnica privada, en un centro de atencin urgente o en una sala de emergencias.  Si tiene una emergencia mdica, por favor llame inmediatamente al 911 o vaya a la sala de emergencias.  Nmeros de bper  - Dr. Kowalski: [phone]  - Dra. Moye: [phone]  - Dra. Stewart: [phone]  En caso de inclemencias del tiempo, por favor llame a nuestra lnea principal al [phone] para una actualizacin sobre el estado de cualquier retraso o cierre.  Consejos para la medicacin en dermatologa: Por favor, guarde las cajas en las que vienen los medicamentos de uso tpico para ayudarle a seguir las instrucciones sobre dnde y cmo usarlos. Las farmacias generalmente imprimen las instrucciones del medicamento slo en las cajas y no directamente en los tubos del medicamento.   Si su medicamento es muy caro, por favor, pngase en contacto con nuestra oficina llamando al [phone] y presione la opcin 4 o envenos un mensaje a travs de MyChart.   No podemos decirle cul ser su copago por los medicamentos por adelantado ya que esto es diferente dependiendo de la cobertura de su seguro.  Sin embargo, es posible que podamos encontrar un medicamento sustituto a menor costo o llenar un formulario para que el seguro cubra el medicamento que se considera necesario.   Si se requiere una autorizacin previa para que su compaa de seguros cubra su medicamento, por favor permtanos de 1 a 2 das hbiles para completar este proceso.  Los precios de los medicamentos varan con frecuencia dependiendo del lugar de dnde se surte la receta y alguna farmacias pueden ofrecer precios ms baratos.  El sitio web www.goodrx.com tiene cupones para medicamentos de diferentes farmacias. Los precios aqu no tienen en cuenta lo que podra costar con la ayuda del seguro (puede ser ms barato con su seguro), pero el sitio web puede darle el precio si no utiliz ningn seguro.  - Puede imprimir el cupn correspondiente y llevarlo con su receta a la farmacia.  - Tambin puede pasar por nuestra oficina durante el horario de atencin regular y recoger una tarjeta de cupones de GoodRx.  -   Si necesita que su receta se enve electrnicamente a una farmacia diferente, informe a nuestra oficina a travs de MyChart de [place] o por telfono llamando al [phone] y presione la opcin 4.  

## 2021-10-13 NOTE — Progress Notes (Signed)
Follow-Up Visit   Subjective  Evan Rivas is a 71 y.o. male who presents for the following: Tinea Pedis (Bil feet, Finished Terbinifine x 60m Ketoconazole 2% cr qhs), ISK f/u  (R nose, pt said cleared from LN2), hx of BCC (Nasal tip, Mohs with Dr. CLacinda Axon06/30/2323), and check spot (R lower leg, irritating).  The following portions of the chart were reviewed this encounter and updated as appropriate:   Tobacco  Allergies  Meds  Problems  Med Hx  Surg Hx  Fam Hx     Review of Systems:  No other skin or systemic complaints except as noted in HPI or Assessment and Plan.  Objective  Well appearing patient in no apparent distress; mood and affect are within normal limits.  A focused examination was performed including face, feet. Relevant physical exam findings are noted in the Assessment and Plan.  nasal tip Healing excision/graft site  bil feet Scaling bil feet  R lat calf x 1 Stuck on waxy paps with erythema   Assessment & Plan  History of basal cell carcinoma (BCC) nasal tip  Mohs 09/30/2021 Healing excision site, observe  Tinea pedis of both feet bil feet  Chronic and persistent condition with duration or expected duration over one year. Condition is symptomatic/ bothersome to patient. Not currently at goal.   Restart Lamisil '250mg'$  1 po qd # 30 1rf  Cont Ketoconazole 2% cr qhs  Terbinafine Counseling  Terbinafine is an anti-fungal medicine that can be applied to the skin (over the counter) or taken by mouth (prescription) to treat fungal infections. The pill version is often used to treat fungal infections of the nails or scalp. While most people do not have any side effects from taking terbinafine pills, some possible side effects of the medicine can include taste changes, headache, loss of smell, vision changes, nausea, vomiting, or diarrhea.   Rare side effects can include irritation of the liver, allergic reaction, or decrease in blood counts (which may  show up as not feeling well or developing an infection). If you are concerned about any of these side effects, please stop the medicine and call your doctor, or in the case of an emergency such as feeling very unwell, seek immediate medical care.   Long term medication management.  Patient is using long term (months to years) prescription medication  to control their dermatologic condition.  These medications require periodic monitoring to evaluate for efficacy and side effects and may require periodic laboratory monitoring.  Related Medications terbinafine (LAMISIL) 250 MG tablet Take 1 tablet (250 mg total) by mouth daily. For tinea pedis  Inflamed seborrheic keratosis R lat calf x 1 Symptomatic, irritating, patient would like treated.  Destruction of lesion - R lat calf x 1 Complexity: simple   Destruction method: cryotherapy   Informed consent: discussed and consent obtained   Timeout:  patient name, date of birth, surgical site, and procedure verified Lesion destroyed using liquid nitrogen: Yes   Region frozen until ice ball extended beyond lesion: Yes   Outcome: patient tolerated procedure well with no complications   Post-procedure details: wound care instructions given    Actinic Damage - chronic, secondary to cumulative UV radiation exposure/sun exposure over time - diffuse scaly erythematous macules with underlying dyspigmentation - Recommend daily broad spectrum sunscreen SPF 30+ to sun-exposed areas, reapply every 2 hours as needed.  - Recommend staying in the shade or wearing long sleeves, sun glasses (UVA+UVB protection) and wide brim hats (4-inch brim  around the entire circumference of the hat). - Call for new or changing lesions.  Return in about 9 months (around 07/15/2022) for TBSE, Hx of BCC.  I, Othelia Pulling, RMA, am acting as scribe for Sarina Ser, MD . Documentation: I have reviewed the above documentation for accuracy and completeness, and I agree with the  above.  Sarina Ser, MD

## 2021-10-25 ENCOUNTER — Encounter: Payer: Self-pay | Admitting: Dermatology

## 2021-11-02 ENCOUNTER — Other Ambulatory Visit: Payer: Self-pay

## 2021-11-02 ENCOUNTER — Encounter: Payer: Self-pay | Admitting: Nurse Practitioner

## 2021-11-02 ENCOUNTER — Ambulatory Visit (INDEPENDENT_AMBULATORY_CARE_PROVIDER_SITE_OTHER): Payer: Medicare Other | Admitting: Nurse Practitioner

## 2021-11-02 VITALS — BP 126/76 | HR 86 | Temp 97.8°F | Resp 18 | Ht 75.0 in | Wt 249.6 lb

## 2021-11-02 DIAGNOSIS — M5136 Other intervertebral disc degeneration, lumbar region: Secondary | ICD-10-CM | POA: Diagnosis not present

## 2021-11-02 DIAGNOSIS — F1120 Opioid dependence, uncomplicated: Secondary | ICD-10-CM

## 2021-11-02 DIAGNOSIS — M545 Low back pain, unspecified: Secondary | ICD-10-CM

## 2021-11-02 DIAGNOSIS — G8929 Other chronic pain: Secondary | ICD-10-CM

## 2021-11-02 MED ORDER — HYDROCODONE-ACETAMINOPHEN 10-325 MG PO TABS: 0.5000 | ORAL_TABLET | Freq: Three times a day (TID) | ORAL | 0 refills | Status: DC | PRN

## 2021-11-02 NOTE — Progress Notes (Signed)
BP 126/76   Pulse 86   Temp 97.8 F (36.6 C) (Oral)   Resp 18   Ht '6\' 3"'  (1.905 m)   Wt 249 lb 9.6 oz (113.2 kg)   SpO2 93%   BMI 31.20 kg/m    Subjective:    Patient ID: Evan Rivas, male    DOB: 12-27-1950, 71 y.o.   MRN: [id number]  HPI: Evan Rivas is a 71 y.o. male  Chief Complaint  Patient presents with   Follow-up    2 month follow up, pain medication   Back pain/DDD/continuous opioid dependence: Patient is currently working on tapering down off his hydrocodone.  He does not see pain management for this.  The taper is 5 to 10% each month.  Last month we did #36 hydrocodone.  He will be due for drug test and pain management contract in September.  We will do this at his next appointment.  PDMP checked last fill was 10/01/2021.  We will decrease his dose down to #33 hydrocodone for the next three months.  Discussed future plans for his pain management.  At next appointment we may discuss decreasing his hydrocodone to 30 tablets and increasing his gabapentin.  He is currently taking gabapentin 600 mg 3 times a day.  States his pain has been manageable.    Relevant past medical, surgical, family and social history reviewed and updated as indicated. Interim medical history since our last visit reviewed. Allergies and medications reviewed and updated.  Review of Systems  Constitutional: Negative for fever or weight change.  Respiratory: Negative for cough and shortness of breath.   Cardiovascular: Negative for chest pain or palpitations.  Gastrointestinal: Negative for abdominal pain, no bowel changes.  Musculoskeletal: Negative for gait problem or joint swelling.  Positive for back pain Skin: Negative for rash.  Neurological: Negative for dizziness or headache.  No other specific complaints in a complete review of systems (except as listed in HPI above).      Objective:    BP 126/76   Pulse 86   Temp 97.8 F (36.6 C) (Oral)   Resp 18   Ht '6\' 3"'  (1.905 m)    Wt 249 lb 9.6 oz (113.2 kg)   SpO2 93%   BMI 31.20 kg/m   Wt Readings from Last 3 Encounters:  11/02/21 249 lb 9.6 oz (113.2 kg)  09/15/21 250 lb 12.8 oz (113.8 kg)  09/01/21 254 lb 1.6 oz (115.3 kg)    Physical Exam  Constitutional: Patient appears well-developed and well-nourished. Obese  No distress.  HEENT: head atraumatic, normocephalic, pupils equal and reactive to light,  neck supple Cardiovascular: Normal rate, regular rhythm and normal heart sounds.  No murmur heard. No BLE edema. Pulmonary/Chest: Effort normal and breath sounds normal. No respiratory distress. Abdominal: Soft.  There is no tenderness. Psychiatric: Patient has a normal mood and affect. behavior is normal. Judgment and thought content normal.  Results for orders placed or performed in visit on 06/06/21  Lipid panel  Result Value Ref Range   Cholesterol 131 <200 mg/dL   HDL 30 (L) > OR = 40 mg/dL   Triglycerides 224 (H) <150 mg/dL   LDL Cholesterol (Calc) 70 mg/dL (calc)   Total CHOL/HDL Ratio 4.4 <5.0 (calc)   Non-HDL Cholesterol (Calc) 101 <130 mg/dL (calc)  CBC with Differential/Platelet  Result Value Ref Range   WBC 5.2 3.8 - 10.8 Thousand/uL   RBC 4.23 4.20 - 5.80 Million/uL   Hemoglobin 13.9 13.2 -  17.1 g/dL   HCT 41.5 38.5 - 50.0 %   MCV 98.1 80.0 - 100.0 fL   MCH 32.9 27.0 - 33.0 pg   MCHC 33.5 32.0 - 36.0 g/dL   RDW 13.2 11.0 - 15.0 %   Platelets 211 140 - 400 Thousand/uL   MPV 11.1 7.5 - 12.5 fL   Neutro Abs 2,501 1,500 - 7,800 cells/uL   Lymphs Abs 1,882 850 - 3,900 cells/uL   Absolute Monocytes 458 200 - 950 cells/uL   Eosinophils Absolute 307 15 - 500 cells/uL   Basophils Absolute 52 0 - 200 cells/uL   Neutrophils Relative % 48.1 %   Total Lymphocyte 36.2 %   Monocytes Relative 8.8 %   Eosinophils Relative 5.9 %   Basophils Relative 1.0 %  COMPLETE METABOLIC PANEL WITH GFR  Result Value Ref Range   Glucose, Bld 266 (H) 65 - 99 mg/dL   BUN 37 (H) 7 - 25 mg/dL   Creat 1.85 (H)  0.70 - 1.28 mg/dL   eGFR 39 (L) > OR = 60 mL/min/1.51m   BUN/Creatinine Ratio 20 6 - 22 (calc)   Sodium 142 135 - 146 mmol/L   Potassium 5.2 3.5 - 5.3 mmol/L   Chloride 105 98 - 110 mmol/L   CO2 28 20 - 32 mmol/L   Calcium 9.4 8.6 - 10.3 mg/dL   Total Protein 6.4 6.1 - 8.1 g/dL   Albumin 4.0 3.6 - 5.1 g/dL   Globulin 2.4 1.9 - 3.7 g/dL (calc)   AG Ratio 1.7 1.0 - 2.5 (calc)   Total Bilirubin 0.5 0.2 - 1.2 mg/dL   Alkaline phosphatase (APISO) 86 35 - 144 U/L   AST 12 10 - 35 U/L   ALT 14 9 - 46 U/L  Hemoglobin A1c  Result Value Ref Range   Hgb A1c MFr Bld 8.1 (H) <5.7 % of total Hgb   Mean Plasma Glucose 186 mg/dL   eAG (mmol/L) 10.3 mmol/L      Assessment & Plan:   Problem List Items Addressed This Visit       Musculoskeletal and Integument   DDD (degenerative disc disease), lumbar - Primary    We will continue hydrocodone treatment patient decreased to 33 tablets this month.       Relevant Medications   HYDROcodone-acetaminophen (NORCO) 10-325 MG tablet   HYDROcodone-acetaminophen (NORCO) 10-325 MG tablet (Start on 12/02/2021)   HYDROcodone-acetaminophen (NORCO) 10-325 MG tablet (Start on 01/01/2022)     Other   Continuous opioid dependence ([place]    We will continue hydrocodone treatment patient decreased to 33 tablets this month.       Relevant Medications   HYDROcodone-acetaminophen (NORCO) 10-325 MG tablet   HYDROcodone-acetaminophen (NORCO) 10-325 MG tablet (Start on 12/02/2021)   HYDROcodone-acetaminophen (NORCO) 10-325 MG tablet (Start on 01/01/2022)   Chronic low back pain    We will continue hydrocodone treatment patient decreased to 33 tablets this month.       Relevant Medications   HYDROcodone-acetaminophen (NORCO) 10-325 MG tablet   HYDROcodone-acetaminophen (NORCO) 10-325 MG tablet (Start on 12/02/2021)   HYDROcodone-acetaminophen ([place] 10-325 MG tablet (Start on 01/01/2022)     Follow up plan: Return in about 3 months (around 02/02/2022) for follow  up.

## 2021-11-02 NOTE — Assessment & Plan Note (Signed)
We will continue hydrocodone treatment patient decreased to 33 tablets this month.

## 2021-12-22 ENCOUNTER — Other Ambulatory Visit: Payer: Self-pay

## 2021-12-22 DIAGNOSIS — N401 Enlarged prostate with lower urinary tract symptoms: Secondary | ICD-10-CM

## 2021-12-23 ENCOUNTER — Other Ambulatory Visit: Payer: Medicare Other

## 2021-12-23 DIAGNOSIS — N401 Enlarged prostate with lower urinary tract symptoms: Secondary | ICD-10-CM | POA: Diagnosis not present

## 2021-12-23 DIAGNOSIS — N138 Other obstructive and reflux uropathy: Secondary | ICD-10-CM

## 2021-12-25 LAB — PSA: Prostate Specific Ag, Serum: 0.9 ng/mL (ref 0.0–4.0)

## 2021-12-27 ENCOUNTER — Encounter: Payer: Self-pay | Admitting: Urology

## 2021-12-27 ENCOUNTER — Ambulatory Visit: Payer: Medicare Other | Admitting: Urology

## 2021-12-27 VITALS — BP 169/91 | HR 80 | Ht 75.0 in | Wt 257.0 lb

## 2021-12-27 DIAGNOSIS — Z87442 Personal history of urinary calculi: Secondary | ICD-10-CM

## 2021-12-27 DIAGNOSIS — N1832 Chronic kidney disease, stage 3b: Secondary | ICD-10-CM

## 2021-12-27 DIAGNOSIS — N5203 Combined arterial insufficiency and corporo-venous occlusive erectile dysfunction: Secondary | ICD-10-CM

## 2021-12-27 DIAGNOSIS — N138 Other obstructive and reflux uropathy: Secondary | ICD-10-CM

## 2021-12-27 DIAGNOSIS — N401 Enlarged prostate with lower urinary tract symptoms: Secondary | ICD-10-CM | POA: Diagnosis not present

## 2021-12-27 LAB — BLADDER SCAN AMB NON-IMAGING: Scan Result: 0

## 2021-12-27 MED ORDER — SILDENAFIL CITRATE 20 MG PO TABS: 20.0000 mg | ORAL_TABLET | ORAL | 11 refills | Status: DC | PRN

## 2021-12-27 NOTE — Progress Notes (Signed)
12/27/2021 11:59 AM   Evan Rivas 10/29/1950 [id number]  Referring provider: [facility], [address],  [place] [id number]  Chief Complaint  Patient presents with   Follow-up    HPI: 71 year old male the presents to phimosis status post circumcision as well as BPH status post UroLift and nephrolithiasis status post ureteroscopy returns today for routine annual follow-up.  He is no longer on any BPH medications and has minimal urinary symptoms.  He is emptying well.  No recent flank pain or kidney stone episodes.  Most recent PSA 0.9 on 12/23/2021.  His only complaint today is erectile dysfunction.  Is been going on for several years but seems to be more bothersome to him at this point.  He has multiple risk factors for vasculogenic erectile dysfunction.  He is having difficulty both maintaining and achieving.  He is never tried any medications and is interested.  Results for orders placed or performed in visit on 12/27/21  Bladder Scan (Post Void Residual) in office  Result Value Ref Range   Scan Result 0 ml       IPSS     Row Name 12/27/21 1000         International Prostate Symptom Score   How often have you had the sensation of not emptying your bladder? Less than 1 in 5     How often have you had to urinate less than every two hours? Less than 1 in 5 times     How often have you found you stopped and started again several times when you urinated? Not at All     How often have you found it difficult to postpone urination? Not at All     How often have you had a weak urinary stream? About half the time     How often have you had to strain to start urination? Not at All     How many times did you typically get up at night to urinate? None     Total IPSS Score 5              Score:  1-7 Mild 8-19 Moderate 20-35 Severe   PMH: Past Medical History:  Diagnosis Date   Adrenal hyperplasia ([place])    Anxiety     panic attacks   Arthritis    Basal cell carcinoma 2015   R upper lip - tx with MOHS by Dr. Lacinda Axon   Basal cell carcinoma 08/25/2021   BCC, nasal tip, Mohs completed on 09/30/21   CKD (chronic kidney disease), stage III (HCC)    Depression    GERD (gastroesophageal reflux disease)    Gout    Grade II diastolic dysfunction    a.) TTE 02/19/2018 --> LVEF 60-65%, mild LVH, G2DD, mild MR, mildly dilated LA, RV cavity size/wall thickness/systolic function normal   History of kidney stones    Hyperaldosteronism ([place])    a.) secondary to BILATERAL adrenal hyperplasia   Hyperlipidemia    Hypertension    Nerve entrapment syndrome of lower extremity, right 10/24/2019   Pneumonia    2014 and 2018 ( 2018-klebsiella pneumonia    Sleep apnea    T2DM (type 2 diabetes mellitus) ([place])     Surgical History: Past Surgical History:  Procedure Laterality Date   ARTHROSCOPIC REPAIR ACL Left    CIRCUMCISION N/A 01/17/2021   Procedure: CIRCUMCISION ADULT;  Surgeon: Hollice Espy, MD;  Location: [facility];  Service: Urology;  Laterality: N/A;  CYSTOSCOPY W/ URETERAL STENT PLACEMENT Right 09/07/2014   Procedure: CYSTOSCOPY WITH STENT REPLACEMENT;  Surgeon: Hollice Espy, MD;  Location: [facility];  Service: Urology;  Laterality: Right;   CYSTOSCOPY WITH INSERTION OF UROLIFT N/A 11/24/2019   Procedure: CYSTOSCOPY WITH INSERTION OF UROLIFT;  Surgeon: Hollice Espy, MD;  Location: [facility];  Service: Urology;  Laterality: N/A;   CYSTOSCOPY/URETEROSCOPY/HOLMIUM LASER/STENT PLACEMENT Right 08/17/2014   Procedure: CYSTOSCOPY/URETEROSCOPY//STENT PLACEMENT/ attempt of lithotripsy;  Surgeon: Hollice Espy, MD;  Location: [facility];  Service: Urology;  Laterality: Right;   HERNIA REPAIR     JOINT REPLACEMENT     Bilat knees and right hip   MOHS SURGERY     lip   TOTAL HIP ARTHROPLASTY Right 06/16/2020   Procedure: TOTAL HIP ARTHROPLASTY ANTERIOR APPROACH;  Surgeon: Gaynelle Arabian, MD;  Location: [facility];   Service: Orthopedics;  Laterality: Right;  137mn   TOTAL KNEE ARTHROPLASTY Left 04/24/2016   Procedure: LEFT TOTAL KNEE ARTHROPLASTY;  Surgeon: FGaynelle Arabian MD;  Location: [facility];  Service: Orthopedics;  Laterality: Left;  Adductor Block   TOTAL KNEE ARTHROPLASTY Right 10/23/2016   Procedure: RIGHT TOTAL KNEE ARTHROPLASTY;  Surgeon: AGaynelle Arabian MD;  Location: [facility];  Service: Orthopedics;  Laterality: Right;   URETEROSCOPY WITH HOLMIUM LASER LITHOTRIPSY Right 09/07/2014   Procedure: URETEROSCOPY WITH HOLMIUM LASER LITHOTRIPSY;  Surgeon: AHollice Espy MD;  Location: [facility];  Service: Urology;  Laterality: Right;   VASECTOMY      Home Medications:  Allergies as of 12/27/2021       Reactions   Bee Venom Other (See Comments)   Celebrex [celecoxib]    GI issues   Tetanus Antitoxin Other (See Comments)   Tetanus Toxoids Other (See Comments)   Unkown   Tetanus-diphtheria Toxoids Td    Unknown        Medication List        Accurate as of December 27, 2021 11:59 AM. If you have any questions, ask your nurse or doctor.          Accu-Chek Aviva Plus test strip Generic drug: glucose blood Use as instructed   aspirin EC 81 MG tablet Take 81 mg by mouth daily. Swallow whole.   atorvastatin 40 MG tablet Commonly known as: LIPITOR Take 1 tablet (40 mg total) by mouth at bedtime.   clotrimazole 1 % cream Commonly known as: LOTRIMIN Apply 1 application topically 2 (two) times daily. Apply for 7-10 days.   dapagliflozin propanediol 10 MG Tabs tablet Commonly known as: Farxiga Take 1 tablet (10 mg total) by mouth daily before breakfast. Patient receives via [employer] Patient Assistance Program   DULoxetine 60 MG capsule Commonly known as: CYMBALTA Take 1 capsule (60 mg total) by mouth daily.   fluticasone 50 MCG/ACT nasal spray Commonly known as: FLONASE Place 2 sprays into both nostrils daily.   gabapentin 600 MG tablet Commonly known as: NEURONTIN TAKE 1 TABLET  THREE TIMES A DAY   glipiZIDE 10 MG tablet Commonly known as: GLUCOTROL TAKE 1 TABLET DAILY BEFORE BREAKFAST   HYDROcodone-acetaminophen 10-325 MG tablet Commonly known as: NORCO Take 0.5-1 tablets by mouth every 8 (eight) hours as needed for severe pain. Gradually reducing dose, max #33 for month fill 12/02/2021 What changed: Another medication with the same name was removed. Continue taking this medication, and follow the directions you see here. Changed by: AHollice Espy MD   hydrOXYzine 25 MG tablet Commonly known as: ATARAX Take 25 mg by mouth 3 (three) times daily  as needed for anxiety.   ketoconazole 2 % cream Commonly known as: NIZORAL Apply to the feet QHS.   melatonin 3 MG Tabs tablet Take 3 mg by mouth at bedtime.   metFORMIN 1000 MG tablet Commonly known as: GLUCOPHAGE TAKE 1 TABLET TWICE A DAY WITH A MEAL   metoprolol succinate 100 MG 24 hr tablet Commonly known as: TOPROL-XL Take 1 tablet (100 mg total) by mouth daily. Take with or immediately following a meal.   multivitamin tablet Take 1 tablet by mouth daily.   sildenafil 20 MG tablet Commonly known as: Revatio Take 1 tablet (20 mg total) by mouth as needed. Take 1-5 tabs as needed prior to intercourse Started by: Hollice Espy, MD   spironolactone 25 MG tablet Commonly known as: ALDACTONE Take 1 tablet (25 mg total) by mouth 2 (two) times a week. What changed: additional instructions   terbinafine 250 MG tablet Commonly known as: LamISIL Take 1 tablet (250 mg total) by mouth daily. For tinea pedis   tiZANidine 4 MG tablet Commonly known as: ZANAFLEX TAKE 1 TABLET EVERY 6 HOURS AS NEEDED FOR MUSCLE SPASMS   Trulicity 1.5 IR/6.7EL Sopn Generic drug: Dulaglutide Inject 1.5 mg into the skin once a week.   valsartan-hydrochlorothiazide 320-12.5 MG tablet Commonly known as: DIOVAN-HCT Take 1 tablet by mouth daily.        Allergies:  Allergies  Allergen Reactions   Bee Venom Other (See  Comments)   Celebrex [Celecoxib]     GI issues   Tetanus Antitoxin Other (See Comments)   Tetanus Toxoids Other (See Comments)    Unkown   Tetanus-Diphtheria Toxoids Td     Unknown    Family History: Family History  Problem Relation Age of Onset   Hypertension Mother    Cancer Mother    Hyperlipidemia Mother    Arthritis Mother    Stroke Father    Heart disease Father    Obesity Father    Depression Sister    Hypertension Sister    Hyperlipidemia Sister    Learning disabilities Sister    Obesity Sister    Kidney disease Maternal Grandmother     Social History:  reports that he has never smoked. He has never used smokeless tobacco. He reports that he does not drink alcohol and does not use drugs.   Physical Exam: BP (!) 169/91   Pulse 80   Ht '6\' 3"'$  (1.905 m)   Wt 257 lb (116.6 kg)   BMI 32.12 kg/m   Constitutional:  Alert and oriented, No acute distress. HEENT: [place] AT, moist mucus membranes.  Trachea midline, no masses. Cardiovascular: No clubbing, cyanosis, or edema. Respiratory: Normal respiratory effort, no increased work of breathing. Rectal: Normal sphincter tone.  40 cc prostate, nontender no nodules Skin: No rashes, bruises or suspicious lesions. Neurologic: Grossly intact, no focal deficits, moving all 4 extremities. Psychiatric: Normal mood and affect.  Laboratory Data: Lab Results  Component Value Date   WBC 5.2 06/06/2021   HGB 13.9 06/06/2021   HCT 41.5 06/06/2021   MCV 98.1 06/06/2021   PLT 211 06/06/2021    Lab Results  Component Value Date   CREATININE 1.85 (H) 06/06/2021    Lab Results  Component Value Date   PSA 0.6 04/07/2019   Lab Results  Component Value Date   HGBA1C 8.1 (H) 06/06/2021     Assessment & Plan:    1. Benign prostatic hyperplasia with urinary obstruction Symptomatically improved status post UroLift  On  no BPH medications currently, emptying adequately with excellent IPSS score as above  PSA screening  completed today, will defer rectal exam next year but continue PSA screening in light of good health. - Bladder Scan (Post Void Residual) in office - PSA; Future  2. CKD stage G3b/A2, GFR 30-44 and albumin creatinine ratio 30-299 mg/g (HCC) Follow-up with Dr. Candiss Norse  3. Combined arterial insufficiency and corporo-venous occlusive erectile dysfunction We discussed the pathophysiology of erectile dysfunction today along with possible contributing factors. Discussed possible treatment options including PDE 5 inhibitors, vacuum erectile device, intracavernosal injection, MUSE, and placement of the inflatable or malleable penile prosthesis for refractory cases.  In terms of PDE 5 inhibitors, we discussed contraindications for this medication as well as common side effects. Patient was counseled on optimal use. All of his questions were answered in detail.  4. History of kidney stones Asymptomatic,, no further episodes   Return for IPSS/ PVR/ PSA.  Hollice Espy, MD  [facility] [address] [place], [place] [address] [phone]

## 2022-01-02 ENCOUNTER — Other Ambulatory Visit: Payer: Self-pay | Admitting: Nurse Practitioner

## 2022-01-02 ENCOUNTER — Telehealth: Payer: Self-pay | Admitting: Nurse Practitioner

## 2022-01-02 DIAGNOSIS — M5136 Other intervertebral disc degeneration, lumbar region: Secondary | ICD-10-CM

## 2022-01-02 DIAGNOSIS — F1120 Opioid dependence, uncomplicated: Secondary | ICD-10-CM

## 2022-01-02 DIAGNOSIS — M545 Low back pain, unspecified: Secondary | ICD-10-CM

## 2022-01-02 MED ORDER — HYDROCODONE-ACETAMINOPHEN 10-325 MG PO TABS: 0.5000 | ORAL_TABLET | Freq: Three times a day (TID) | ORAL | 0 refills | Status: DC | PRN

## 2022-01-02 NOTE — Telephone Encounter (Signed)
Pt called wanting to cancel his prescription of Hydrocodone [facility] [employer] and send to [facility].    Any questions pt #  [phone]

## 2022-01-02 NOTE — Telephone Encounter (Signed)
Spoke to patient his normal pharmacy is out of medication. I called and cancelled out prescription. Please send a new script to [facility]

## 2022-01-03 NOTE — Telephone Encounter (Signed)
[facility] and [facility], [facility] but he states [place]. Asked pt if he called to verify and we can resend right away, pt feels that it is not his duty, not playing that game.  dr can ck before sending states if he calls by the time she calls it might be gone. Pt states you just give him a comparable substitute. [phone]

## 2022-01-06 NOTE — Telephone Encounter (Signed)
Pt calling to let you know he found his HYDROcodone-acetaminophen (NORCO) 10-325 MG tablet At [facility] [address] [facility] - [place], [place] - [address] AT [facility] [address]   Can you call them asap?  He said they [facility] about [facility] at [facility]

## 2022-01-09 ENCOUNTER — Other Ambulatory Visit: Payer: Self-pay | Admitting: Nurse Practitioner

## 2022-01-09 DIAGNOSIS — M51369 Other intervertebral disc degeneration, lumbar region without mention of lumbar back pain or lower extremity pain: Secondary | ICD-10-CM

## 2022-01-09 DIAGNOSIS — M5136 Other intervertebral disc degeneration, lumbar region: Secondary | ICD-10-CM

## 2022-01-09 DIAGNOSIS — F1120 Opioid dependence, uncomplicated: Secondary | ICD-10-CM

## 2022-01-09 DIAGNOSIS — G8929 Other chronic pain: Secondary | ICD-10-CM

## 2022-01-09 MED ORDER — HYDROCODONE-ACETAMINOPHEN 10-325 MG PO TABS: 0.5000 | ORAL_TABLET | Freq: Three times a day (TID) | ORAL | 0 refills | Status: DC | PRN

## 2022-01-09 NOTE — Telephone Encounter (Signed)
FYI

## 2022-01-09 NOTE — Telephone Encounter (Signed)
Pt called to remind Bonnita Nasuti and [name] about his refill for Hydrocodone needing to go to [facility] due to [facility] not [facility] in [facility] / please advise asap

## 2022-01-17 ENCOUNTER — Telehealth: Payer: Self-pay

## 2022-01-17 NOTE — Progress Notes (Signed)
Chronic Care Management Pharmacy Assistant   Name: Evan Rivas  MRN: [id number] DOB: May 12, 1950  Reason for Encounter: 2024 Patient Assistance Renewal Application   Patient is currently receiving patient assistance for Trulicity through [place] requires all Medicare patient's to renew their applications for the 0175 year prior to 04/02/2022.  Renewal Application started, and will be mailed to the patient's home address for him to complete. Once completed by the patient he will be instructed to return the application along with proof of income to [place] at [facility].  Since patient receives his [place] through AZ[employer]ME patient assistance. AZ&ME will automatically review patient's information for renewal. If patient has to submit a new application patient and provider will be notified.  Application e-mailed to PTM so she can mail the application to the patient's home address. Patient can contact me directly @ [phone] if she has any questions.  Medications: Outpatient Encounter Medications as of 01/17/2022  Medication Sig   aspirin EC 81 MG tablet Take 81 mg by mouth daily. Swallow whole.   atorvastatin (LIPITOR) 40 MG tablet Take 1 tablet (40 mg total) by mouth at bedtime.   clotrimazole (LOTRIMIN) 1 % cream Apply 1 application topically 2 (two) times daily. Apply for 7-10 days.   dapagliflozin propanediol (FARXIGA) 10 MG TABS tablet Take 1 tablet (10 mg total) by mouth daily before breakfast. Patient receives via AZ&ME Patient Assistance Program   Dulaglutide (TRULICITY) 1.5 EU/2.3NT SOPN Inject 1.5 mg into the skin once a week.   DULoxetine (CYMBALTA) 60 MG capsule Take 1 capsule (60 mg total) by mouth daily.   fluticasone (FLONASE) 50 MCG/ACT nasal spray Place 2 sprays into both nostrils daily.   gabapentin (NEURONTIN) 600 MG tablet TAKE 1 TABLET THREE TIMES A DAY   glipiZIDE (GLUCOTROL) 10 MG tablet TAKE 1  TABLET DAILY BEFORE BREAKFAST   glucose blood (ACCU-CHEK AVIVA PLUS) test strip Use as instructed   HYDROcodone-acetaminophen (NORCO) 10-325 MG tablet Take 0.5-1 tablets by mouth every 8 (eight) hours as needed for severe pain. Gradually reducing dose, max #33 for month fill 12/02/2021   hydrOXYzine (ATARAX/VISTARIL) 25 MG tablet Take 25 mg by mouth 3 (three) times daily as needed for anxiety.    ketoconazole (NIZORAL) 2 % cream Apply to the feet QHS.   melatonin 3 MG TABS tablet Take 3 mg by mouth at bedtime.   metFORMIN (GLUCOPHAGE) 1000 MG tablet TAKE 1 TABLET TWICE A DAY WITH A MEAL   metoprolol succinate (TOPROL-XL) 100 MG 24 hr tablet Take 1 tablet (100 mg total) by mouth daily. Take with or immediately following a meal.   Multiple Vitamin (MULTIVITAMIN) tablet Take 1 tablet by mouth daily.   sildenafil (REVATIO) 20 MG tablet Take 1 tablet (20 mg total) by mouth as needed. Take 1-5 tabs as needed prior to intercourse   spironolactone (ALDACTONE) 25 MG tablet Take 1 tablet (25 mg total) by mouth 2 (two) times a week. (Patient taking differently: Take 25 mg by mouth 2 (two) times a week. On Wednesdays and Saturdays only.)   terbinafine (LAMISIL) 250 MG tablet Take 1 tablet (250 mg total) by mouth daily. For tinea pedis   tiZANidine (ZANAFLEX) 4 MG tablet TAKE 1 TABLET EVERY 6 HOURS AS NEEDED FOR MUSCLE SPASMS   valsartan-hydrochlorothiazide (DIOVAN-HCT) 320-12.5 MG tablet Take 1 tablet by mouth daily.   No facility-administered encounter medications on file as of 01/17/2022.    [name], CPA/CMA Clinical  Pharmacist Assistant Phone: [phone]

## 2022-02-02 ENCOUNTER — Ambulatory Visit (INDEPENDENT_AMBULATORY_CARE_PROVIDER_SITE_OTHER): Payer: Medicare Other | Admitting: Nurse Practitioner

## 2022-02-02 ENCOUNTER — Encounter: Payer: Self-pay | Admitting: Nurse Practitioner

## 2022-02-02 ENCOUNTER — Other Ambulatory Visit: Payer: Self-pay

## 2022-02-02 ENCOUNTER — Other Ambulatory Visit: Payer: Self-pay | Admitting: Nurse Practitioner

## 2022-02-02 VITALS — BP 132/84 | HR 93 | Temp 97.8°F | Resp 18 | Ht 75.0 in | Wt 256.6 lb

## 2022-02-02 DIAGNOSIS — I700 Atherosclerosis of aorta: Secondary | ICD-10-CM

## 2022-02-02 DIAGNOSIS — E1169 Type 2 diabetes mellitus with other specified complication: Secondary | ICD-10-CM | POA: Diagnosis not present

## 2022-02-02 DIAGNOSIS — E669 Obesity, unspecified: Secondary | ICD-10-CM

## 2022-02-02 DIAGNOSIS — G4709 Other insomnia: Secondary | ICD-10-CM | POA: Insufficient documentation

## 2022-02-02 DIAGNOSIS — I10 Essential (primary) hypertension: Secondary | ICD-10-CM | POA: Diagnosis not present

## 2022-02-02 DIAGNOSIS — Z23 Encounter for immunization: Secondary | ICD-10-CM | POA: Diagnosis not present

## 2022-02-02 DIAGNOSIS — F1120 Opioid dependence, uncomplicated: Secondary | ICD-10-CM

## 2022-02-02 DIAGNOSIS — F331 Major depressive disorder, recurrent, moderate: Secondary | ICD-10-CM

## 2022-02-02 DIAGNOSIS — M545 Low back pain, unspecified: Secondary | ICD-10-CM | POA: Diagnosis not present

## 2022-02-02 DIAGNOSIS — M109 Gout, unspecified: Secondary | ICD-10-CM

## 2022-02-02 DIAGNOSIS — Z79899 Other long term (current) drug therapy: Secondary | ICD-10-CM | POA: Diagnosis not present

## 2022-02-02 DIAGNOSIS — M5136 Other intervertebral disc degeneration, lumbar region: Secondary | ICD-10-CM

## 2022-02-02 DIAGNOSIS — F3341 Major depressive disorder, recurrent, in partial remission: Secondary | ICD-10-CM

## 2022-02-02 DIAGNOSIS — N529 Male erectile dysfunction, unspecified: Secondary | ICD-10-CM

## 2022-02-02 DIAGNOSIS — G8929 Other chronic pain: Secondary | ICD-10-CM

## 2022-02-02 DIAGNOSIS — N1832 Chronic kidney disease, stage 3b: Secondary | ICD-10-CM

## 2022-02-02 DIAGNOSIS — E7800 Pure hypercholesterolemia, unspecified: Secondary | ICD-10-CM

## 2022-02-02 MED ORDER — HYDROCODONE-ACETAMINOPHEN 10-325 MG PO TABS: 0.5000 | ORAL_TABLET | Freq: Three times a day (TID) | ORAL | 0 refills | Status: DC | PRN

## 2022-02-02 MED ORDER — GABAPENTIN 600 MG PO TABS: 600.0000 mg | ORAL_TABLET | Freq: Three times a day (TID) | ORAL | 3 refills | Status: DC

## 2022-02-02 NOTE — Assessment & Plan Note (Signed)
Patient saw urology for his erectile dysfunction and was given prescription for sildenafil patient has not picked up prescription yet.  Called pharmacy.  Pharmacy does have prescription since it is ready printed up good Rx coupon for patient.

## 2022-02-02 NOTE — Assessment & Plan Note (Signed)
Patient's blood pressure today is 132/80.  Continue taking valsartan-hydrochlorothiazide 320-12.5 daily, metoprolol 100 mg daily.

## 2022-02-02 NOTE — Assessment & Plan Note (Signed)
Patient is working on better diabetic control.  Patient last saw nephrology on 08/18/2021.  Patient does have an appointment later on this month.

## 2022-02-02 NOTE — Assessment & Plan Note (Signed)
Updated patient's urine drug screen and pain management contract.  We have been working on tapering patient down off his hydrocodone.  Decreased hydrocodone to 30 tablets a month.  Patient is also taking gabapentin 600 mg 3 times a day, Cymbalta 60 mg daily and tizanidine when needed

## 2022-02-02 NOTE — Assessment & Plan Note (Signed)
Continue taking atorvastatin 40 mg daily. 

## 2022-02-02 NOTE — Assessment & Plan Note (Signed)
Patient reporting an increase in insomnia.  Patient states he has been taking melatonin but has not had much patient does have a prescription of hydroxyzine that he has not been taking.  Recommend patient trial that and see if that helps to get some sleep.  If not we will look into other medications.

## 2022-02-02 NOTE — Progress Notes (Addendum)
BP 132/84   Pulse 93   Temp 97.8 F (36.6 C) (Oral)   Resp 18   Ht '6\' 3"'$  (1.905 m)   Wt 256 lb 9.6 oz (116.4 kg)   SpO2 97%   BMI 32.07 kg/m    Subjective:    Patient ID: Evan Rivas, male    DOB: 1951-03-17, 71 y.o.   MRN: [id number]  HPI: Evan Rivas is a 71 y.o. male  Chief Complaint  Patient presents with   Back Pain    Medication refills   Diabetes   Hypertension   Hyperlipidemia    Follow up   Back pain/DDD/continuous opioid dependence:  Patient is due for his urine drug screening and pain management contract.  Will get that done today.  Patient has been working on tapering down off his hydrocodone.  The taper plan is to decrease 5-10% each month, however it has been more like every three months.   Last visit we did #33 hydrocodone.  Plan today is to decrease to 30 tablets.  Patient also currently takes gabapentin 600 mg 3 times a day, cymbalta 60 mg daily, and tizanidine when needed.   Hyperlipidemia/Aortic atherosclerosis:He is currently taking atorvastatin 40 mg daily.  He denies any myalgia.  His last LDL was 70. However his triglycerides were 224 on 06/06/2021.  Hypertension: His blood pressure today 132/84.  He is taking valsartan-hydrochlorothiazide 320-12.5 mg daily, and metoprolol 100 mg daily.  He does go to the gym regularly he denies any chest pain, headaches, shortness of breath or blurred vision.  Diabetes: His last A1c was 8.1 on 06/06/2021.  He says his blood sugars have been running a bit high 172-150.  This am it was 216. He says he has not been really strict with his diet but is going to work on it.  He is currently taking metformin 1000 mg twice daily, glipizide 10 mg daily, Trulicity 1.5 mg weekly and Farxiga 10 mg daily.  He denies any hypoglycemic episodes, polydipsia, polyuria or polyphagia.  He is due for foot exam.  We will complete that today.  CKD: Getting labs today.  His last GFR was 39 on 06/06/2021.  Last saw nephrologist on  08/18/2021.  Patient reports he was taking spironolactone and nephrology.  Obesity:His current weight is 256 lbs with a BMI of 32.07.  He works out regularly and tries to watch what he eats.  Recommend continuing to work on lifestyle modification.  Depression:  patient is currently taking cymbalta 60 mg daily. He reports he is doing well. His PHQ9 score is negative.     02/02/2022   10:09 AM 11/02/2021   10:39 AM 09/01/2021   10:37 AM 09/01/2021   10:25 AM 07/04/2021   10:46 AM  Depression screen PHQ 2/9  Decreased Interest 0 1 0 0 0  Down, Depressed, Hopeless 0 1 0 0 0  PHQ - 2 Score 0 2 0 0 0  Altered sleeping 0 0 0    Tired, decreased energy 0 1 0    Change in appetite 0 0     Feeling bad or failure about yourself  0 0 0    Trouble concentrating 0 0 0    Moving slowly or fidgety/restless 0 0 0    Suicidal thoughts 0 0 0    PHQ-9 Score 0 3 0    Difficult doing work/chores Not difficult at all Not difficult at all Not difficult at all      Insomnia:  Patient reports that lately he has been having more insomnia. He says he has been taking melatonin but it has not helped.  He has a prescription for hydroxyzine that he is currently not taking. Recommend he try taking that at night and see if that helps him get better sleep.  If no improvement will try another medication.   ED:  patient reports he recently saw urology for routine follow up and they prescribed him sildenafil 20 mg.  With instructions to take 1-5 tabs as needed prior to intercourse. Patient reports he has not been able to get the prescription. Spoke to [facility] and they have the prescription and it would be approx 378 dollars a month.  Printed off good rx coupon to get prescription for $21.    Relevant past medical, surgical, family and social history reviewed and updated as indicated. Interim medical history since our last visit reviewed. Allergies and medications reviewed and updated.  Review of Systems  Constitutional:  Negative for fever or weight change.  Respiratory: Negative for cough and shortness of breath.   Cardiovascular: Negative for chest pain or palpitations.  Gastrointestinal: Negative for abdominal pain, no bowel changes.  Musculoskeletal: Negative for gait problem or joint swelling.  Positive for back pain Skin: Negative for rash.  Neurological: Negative for dizziness or headache.  No other specific complaints in a complete review of systems (except as listed in HPI above).      Objective:    BP 132/84   Pulse 93   Temp 97.8 F (36.6 C) (Oral)   Resp 18   Ht '6\' 3"'$  (1.905 m)   Wt 256 lb 9.6 oz (116.4 kg)   SpO2 97%   BMI 32.07 kg/m   Wt Readings from Last 3 Encounters:  02/02/22 256 lb 9.6 oz (116.4 kg)  12/27/21 257 lb (116.6 kg)  11/02/21 249 lb 9.6 oz (113.2 kg)    Physical Exam  Constitutional: Patient appears well-developed and well-nourished. Obese  No distress.  HEENT: head atraumatic, normocephalic, pupils equal and reactive to light,  neck supple Cardiovascular: Normal rate, regular rhythm and normal heart sounds.  No murmur heard. No BLE edema. Pulmonary/Chest: Effort normal and breath sounds normal. No respiratory distress. Abdominal: Soft.  There is no tenderness. Psychiatric: Patient has a normal mood and affect. behavior is normal. Judgment and thought content normal.  Diabetic Foot Exam - Simple   Simple Foot Form Diabetic Foot exam was performed with the following findings: Yes 02/02/2022 10:17 AM  Visual Inspection No deformities, no ulcerations, no other skin breakdown bilaterally: Yes Sensation Testing Intact to touch and monofilament testing bilaterally: Yes Pulse Check Posterior Tibialis and Dorsalis pulse intact bilaterally: Yes Comments        Assessment & Plan:   Problem List Items Addressed This Visit       Cardiovascular and Mediastinum   Essential hypertension    Patient's blood pressure today is 132/80.  Continue taking  valsartan-hydrochlorothiazide 320-12.5 daily, metoprolol 100 mg daily.      Relevant Orders   CBC with Differential/Platelet   COMPLETE METABOLIC PANEL WITH GFR   Aortic atherosclerosis (HCC)    Continue taking atorvastatin 40 mg daily.        Endocrine   Diabetes mellitus type 2 in obese (HCC)    Last A1c was 8.1.  We will get labs today.  His blood sugar has been running a little bit high at home.  Patient states he has not been as strict on his  diet as he normally has.  He is currently taking metformin 1000 mg twice daily, glipizide 10 mg daily, Trulicity 1.5 mg weekly and Farxiga 10 mg daily.  He is due for foot exam and microalbumin urine.  Orders placed.      Relevant Orders   COMPLETE METABOLIC PANEL WITH GFR   Hemoglobin A1c   Microalbumin / creatinine urine ratio     Musculoskeletal and Integument   DDD (degenerative disc disease), lumbar - Primary    Updated patient's urine drug screen and pain management contract.  We have been working on tapering patient down off his hydrocodone.  Decreased hydrocodone to 30 tablets a month.  Patient is also taking gabapentin 600 mg 3 times a day, Cymbalta 60 mg daily and tizanidine when needed      Relevant Medications   HYDROcodone-acetaminophen (NORCO) 10-325 MG tablet   HYDROcodone-acetaminophen (NORCO) 10-325 MG tablet (Start on 03/03/2022)   HYDROcodone-acetaminophen (NORCO) 10-325 MG tablet (Start on 04/03/2022)   gabapentin (NEURONTIN) 600 MG tablet     Genitourinary   CKD stage G3b/A2, GFR 30-44 and albumin creatinine ratio 30-299 mg/g (HCC)    Patient is working on better diabetic control.  Patient last saw nephrology on 08/18/2021.  Patient does have an appointment later on this month.        Other   Continuous opioid dependence ([place])    Updated patient's urine drug screen and pain management contract.  We have been working on tapering patient down off his hydrocodone.  Decreased hydrocodone to 30 tablets a month.  Patient  is also taking gabapentin 600 mg 3 times a day, Cymbalta 60 mg daily and tizanidine when needed      Relevant Medications   HYDROcodone-acetaminophen (NORCO) 10-325 MG tablet   HYDROcodone-acetaminophen (NORCO) 10-325 MG tablet (Start on 03/03/2022)   HYDROcodone-acetaminophen (NORCO) 10-325 MG tablet (Start on 04/03/2022)   gabapentin (NEURONTIN) 600 MG tablet   Other Relevant Orders   DRUG MONITOR, PANEL 1, W/CONF, URINE   Morbid obesity ([place])    She continues to workout at the gym.  He has been a little lax on his diet.  Patient states he has been start working on focusing on the more.      Hyperlipidemia    Continue taking atorvastatin 40 mg daily.      Relevant Orders   COMPLETE METABOLIC PANEL WITH GFR   Lipid panel   Chronic low back pain    Updated patient's urine drug screen and pain management contract.  We have been working on tapering patient down off his hydrocodone.  Decreased hydrocodone to 30 tablets a month.  Patient is also taking gabapentin 600 mg 3 times a day, Cymbalta 60 mg daily and tizanidine when needed      Relevant Medications   HYDROcodone-acetaminophen (NORCO) 10-325 MG tablet   HYDROcodone-acetaminophen (NORCO) 10-325 MG tablet (Start on 03/03/2022)   HYDROcodone-acetaminophen (NORCO) 10-325 MG tablet (Start on 04/03/2022)   gabapentin (NEURONTIN) 600 MG tablet   Depression    Continue taking Cymbalta 60 mg daily.      Controlled substance agreement signed    Updated patient's urine drug screen and pain management contract.  We have been working on tapering patient down off his hydrocodone.  Decreased hydrocodone to 30 tablets a month.  Patient is also taking gabapentin 600 mg 3 times a day, Cymbalta 60 mg daily and tizanidine when needed      Other insomnia    Patient reporting an increase in  insomnia.  Patient states he has been taking melatonin but has not had much patient does have a prescription of hydroxyzine that he has not been taking.  Recommend  patient trial that and see if that helps to get some sleep.  If not we will look into other medications.      Erectile dysfunction    Patient saw urology for his erectile dysfunction and was given prescription for sildenafil patient has not picked up prescription yet.  Called pharmacy.  Pharmacy does have prescription since it is ready printed up good Rx coupon for patient.      RESOLVED: Major depressive disorder, recurrent episode, moderate (HCC)   Other Visit Diagnoses     Need for influenza vaccination       Relevant Orders   Flu Vaccine QUAD High Dose(Fluad) (Completed)        Follow up plan: Return in about 3 months (around 05/05/2022) for follow up.

## 2022-02-02 NOTE — Assessment & Plan Note (Signed)
She continues to workout at the gym.  He has been a little lax on his diet.  Patient states he has been start working on focusing on the more.

## 2022-02-02 NOTE — Assessment & Plan Note (Signed)
Last A1c was 8.1.  We will get labs today.  His blood sugar has been running a little bit high at home.  Patient states he has not been as strict on his diet as he normally has.  He is currently taking metformin 1000 mg twice daily, glipizide 10 mg daily, Trulicity 1.5 mg weekly and Farxiga 10 mg daily.  He is due for foot exam and microalbumin urine.  Orders placed.

## 2022-02-02 NOTE — Assessment & Plan Note (Signed)
Continue taking Cymbalta 60 mg daily.

## 2022-02-03 ENCOUNTER — Telehealth: Payer: Self-pay | Admitting: Emergency Medicine

## 2022-02-03 NOTE — Telephone Encounter (Signed)
Per labs, please increase Trulicity if needed. Please let Evan Rivas know so he can make sure he get enough on his new mail order.  Please send refill in on Hydroxyzine for sleep only have a few left at home. Need refills also on test strips accu chek plus. Send to [facility]

## 2022-02-05 LAB — DRUG MONITOR, PANEL 1, W/CONF, URINE
Amphetamines: NEGATIVE ng/mL (ref <500)
Barbiturates: NEGATIVE ng/mL (ref <300)
Benzodiazepines: NEGATIVE ng/mL (ref <100)
Cocaine Metabolite: NEGATIVE ng/mL (ref <150)
Codeine: NEGATIVE ng/mL (ref <50)
Creatinine: 54.4 mg/dL (ref >=20.0)
Hydrocodone: 122 ng/mL — ABNORMAL HIGH (ref <50)
Hydromorphone: NEGATIVE ng/mL (ref <50)
Marijuana Metabolite: NEGATIVE ng/mL (ref <20)
Methadone Metabolite: NEGATIVE ng/mL (ref <100)
Morphine: NEGATIVE ng/mL (ref <50)
Norhydrocodone: 357 ng/mL — ABNORMAL HIGH (ref <50)
Opiates: POSITIVE ng/mL — AB (ref <100)
Oxidant: NEGATIVE ug/mL (ref <200)
Oxycodone: NEGATIVE ng/mL (ref <100)
Phencyclidine: NEGATIVE ng/mL (ref <25)
pH: 5.3 (ref 4.5–9.0)

## 2022-02-05 LAB — COMPLETE METABOLIC PANEL WITH GFR
AG Ratio: 1.6 (calc) (ref 1.0–2.5)
ALT: 17 U/L (ref 9–46)
AST: 14 U/L (ref 10–35)
Albumin: 4.1 g/dL (ref 3.6–5.1)
Alkaline phosphatase (APISO): 83 U/L (ref 35–144)
BUN/Creatinine Ratio: 20 (calc) (ref 6–22)
BUN: 29 mg/dL — ABNORMAL HIGH (ref 7–25)
CO2: 29 mmol/L (ref 20–32)
Calcium: 9.7 mg/dL (ref 8.6–10.3)
Chloride: 102 mmol/L (ref 98–110)
Creat: 1.46 mg/dL — ABNORMAL HIGH (ref 0.70–1.28)
Globulin: 2.6 g/dL (calc) (ref 1.9–3.7)
Glucose, Bld: 300 mg/dL — ABNORMAL HIGH (ref 65–99)
Potassium: 4.8 mmol/L (ref 3.5–5.3)
Sodium: 142 mmol/L (ref 135–146)
Total Bilirubin: 0.4 mg/dL (ref 0.2–1.2)
Total Protein: 6.7 g/dL (ref 6.1–8.1)
eGFR: 51 mL/min/{1.73_m2} — ABNORMAL LOW (ref >=60)

## 2022-02-05 LAB — CBC WITH DIFFERENTIAL/PLATELET
Absolute Monocytes: 376 cells/uL (ref 200–950)
Basophils Absolute: 53 cells/uL (ref 0–200)
Basophils Relative: 0.8 %
Eosinophils Absolute: 198 cells/uL (ref 15–500)
Eosinophils Relative: 3 %
HCT: 44.7 % (ref 38.5–50.0)
Hemoglobin: 15.5 g/dL (ref 13.2–17.1)
Lymphs Abs: 1412 cells/uL (ref 850–3900)
MCH: 33.5 pg — ABNORMAL HIGH (ref 27.0–33.0)
MCHC: 34.7 g/dL (ref 32.0–36.0)
MCV: 96.8 fL (ref 80.0–100.0)
MPV: 11.5 fL (ref 7.5–12.5)
Monocytes Relative: 5.7 %
Neutro Abs: 4561 cells/uL (ref 1500–7800)
Neutrophils Relative %: 69.1 %
Platelets: 195 10*3/uL (ref 140–400)
RBC: 4.62 10*6/uL (ref 4.20–5.80)
RDW: 12.4 % (ref 11.0–15.0)
Total Lymphocyte: 21.4 %
WBC: 6.6 10*3/uL (ref 3.8–10.8)

## 2022-02-05 LAB — LIPID PANEL
Cholesterol: 149 mg/dL (ref <200)
HDL: 38 mg/dL — ABNORMAL LOW (ref >=40)
LDL Cholesterol (Calc): 80 mg/dL (calc)
Non-HDL Cholesterol (Calc): 111 mg/dL (calc) (ref <130)
Total CHOL/HDL Ratio: 3.9 (calc) (ref <5.0)
Triglycerides: 211 mg/dL — ABNORMAL HIGH (ref <150)

## 2022-02-05 LAB — HEMOGLOBIN A1C
Hgb A1c MFr Bld: 8.7 % of total Hgb — ABNORMAL HIGH (ref <5.7)
Mean Plasma Glucose: 203 mg/dL
eAG (mmol/L): 11.2 mmol/L

## 2022-02-05 LAB — DM TEMPLATE

## 2022-02-05 LAB — MICROALBUMIN / CREATININE URINE RATIO
Creatinine, Urine: 55 mg/dL (ref 20–320)
Microalb Creat Ratio: 5 mcg/mg creat (ref <30)
Microalb, Ur: 0.3 mg/dL

## 2022-02-05 LAB — URIC ACID: Uric Acid, Serum: 7.3 mg/dL (ref 4.0–8.0)

## 2022-02-06 ENCOUNTER — Telehealth: Payer: Self-pay | Admitting: Nurse Practitioner

## 2022-02-06 ENCOUNTER — Other Ambulatory Visit: Payer: Self-pay | Admitting: Nurse Practitioner

## 2022-02-06 DIAGNOSIS — M5136 Other intervertebral disc degeneration, lumbar region: Secondary | ICD-10-CM

## 2022-02-06 DIAGNOSIS — E1169 Type 2 diabetes mellitus with other specified complication: Secondary | ICD-10-CM

## 2022-02-06 DIAGNOSIS — F1120 Opioid dependence, uncomplicated: Secondary | ICD-10-CM

## 2022-02-06 DIAGNOSIS — G4709 Other insomnia: Secondary | ICD-10-CM

## 2022-02-06 MED ORDER — TRULICITY 3 MG/0.5ML ~~LOC~~ SOAJ: 3.0000 mg | SUBCUTANEOUS | 3 refills | Status: DC

## 2022-02-06 MED ORDER — HYDROXYZINE PAMOATE 25 MG PO CAPS: 25.0000 mg | ORAL_CAPSULE | Freq: Every evening | ORAL | 1 refills | Status: DC | PRN

## 2022-02-06 MED ORDER — ACCU-CHEK AVIVA PLUS VI STRP: ORAL_STRIP | 12 refills | Status: DC

## 2022-02-06 NOTE — Telephone Encounter (Signed)
Copied from [place] [phone]. Topic: General - Other >> Feb 06, 2022 12:40 PM Ja-Kwan M wrote: Reason for CRM: Pt stated he needs new Rx for glucose blood test strips (True Metrix) or provide him with a new Rx for a new glucose meter and send to [place], [place]  Phone: [phone] Fax: [phone]

## 2022-02-08 ENCOUNTER — Other Ambulatory Visit: Payer: Self-pay | Admitting: Emergency Medicine

## 2022-02-08 DIAGNOSIS — E1169 Type 2 diabetes mellitus with other specified complication: Secondary | ICD-10-CM

## 2022-02-08 MED ORDER — TRUE METRIX BLOOD GLUCOSE TEST VI STRP: ORAL_STRIP | 12 refills | Status: AC

## 2022-02-08 NOTE — Telephone Encounter (Signed)
Pend a script to you please sign and [facility]

## 2022-02-10 ENCOUNTER — Telehealth: Payer: Self-pay | Admitting: Nurse Practitioner

## 2022-02-10 NOTE — Telephone Encounter (Signed)
FYI You already updated his Trulicity

## 2022-02-10 NOTE — Telephone Encounter (Signed)
-  Duloxetine is needing prior authorization  -Pt states that [name] wants to increase his dosage of trulicity but he just submitted his paperwork to Cristie Hem so he is needing [name] to give Cristie Hem that updated information

## 2022-02-15 DIAGNOSIS — E1122 Type 2 diabetes mellitus with diabetic chronic kidney disease: Secondary | ICD-10-CM | POA: Diagnosis not present

## 2022-02-15 DIAGNOSIS — I10 Essential (primary) hypertension: Secondary | ICD-10-CM | POA: Diagnosis not present

## 2022-02-15 DIAGNOSIS — N1831 Chronic kidney disease, stage 3a: Secondary | ICD-10-CM | POA: Diagnosis not present

## 2022-02-15 DIAGNOSIS — E875 Hyperkalemia: Secondary | ICD-10-CM | POA: Diagnosis not present

## 2022-02-16 DIAGNOSIS — C44311 Basal cell carcinoma of skin of nose: Secondary | ICD-10-CM | POA: Diagnosis not present

## 2022-02-20 ENCOUNTER — Other Ambulatory Visit: Payer: Self-pay | Admitting: Nurse Practitioner

## 2022-02-20 DIAGNOSIS — N200 Calculus of kidney: Secondary | ICD-10-CM | POA: Diagnosis not present

## 2022-02-20 DIAGNOSIS — E1122 Type 2 diabetes mellitus with diabetic chronic kidney disease: Secondary | ICD-10-CM | POA: Diagnosis not present

## 2022-02-20 DIAGNOSIS — N1831 Chronic kidney disease, stage 3a: Secondary | ICD-10-CM | POA: Diagnosis not present

## 2022-02-20 DIAGNOSIS — I10 Essential (primary) hypertension: Secondary | ICD-10-CM | POA: Diagnosis not present

## 2022-02-20 DIAGNOSIS — G4709 Other insomnia: Secondary | ICD-10-CM

## 2022-02-20 MED ORDER — HYDROXYZINE PAMOATE 25 MG PO CAPS: 25.0000 mg | ORAL_CAPSULE | Freq: Every evening | ORAL | 1 refills | Status: DC | PRN

## 2022-02-20 NOTE — Telephone Encounter (Signed)
He will need a 90 day supply [facility]. I see the script written for 30 and 1

## 2022-02-20 NOTE — Telephone Encounter (Signed)
[facility] called saying [facility] has still not mailed him the Hydroxyzine and he is completely out.  They told him there was an issue with the prescription.  CB@  [phone]

## 2022-03-14 ENCOUNTER — Ambulatory Visit (INDEPENDENT_AMBULATORY_CARE_PROVIDER_SITE_OTHER): Payer: Medicare Other | Admitting: Physician Assistant

## 2022-03-14 ENCOUNTER — Encounter: Payer: Self-pay | Admitting: Physician Assistant

## 2022-03-14 DIAGNOSIS — Z0000 Encounter for general adult medical examination without abnormal findings: Secondary | ICD-10-CM | POA: Diagnosis not present

## 2022-03-14 NOTE — Patient Instructions (Signed)
Evan Rivas , Thank you for taking time to come for your Medicare Wellness Visit. I appreciate your ongoing commitment to your health goals. Please review the following plan we discussed and let me know if I can assist you in the future.   Screening recommendations/referrals: Colonoscopy: This is up to date at this time. You should complete this again in 2032 Recommended yearly ophthalmology/optometry visit for glaucoma screening and checkup Recommended yearly dental visit for hygiene and checkup  Vaccinations: Influenza vaccine: Completed for this year  Pneumococcal vaccine: Completed  Tdap vaccine: Up to date- next due in 2025 or sooner if you have a dirty cut or puncture wound Shingles vaccine: your second vaccine is due in March of 2024   Covid-19: Please stay up to date with the recommended boosters for your age group   Advanced directives: Information on completing advanced directives is attached to your AVS  Conditions/risks identified: Please make sure you are receiving regular follow up with your Dermatologist for skin cancer screenings   Next appointment: Follow up in one year for your annual wellness visit.   Preventive Care 22 Years and Older, Male  Preventive care refers to lifestyle choices and visits with your health care provider that can promote health and wellness. What does preventive care include? A yearly physical exam. This is also called an annual well check. Dental exams once or twice a year. Routine eye exams. Ask your health care provider how often you should have your eyes checked. Personal lifestyle choices, including: Daily care of your teeth and gums. Regular physical activity. Eating a healthy diet. Avoiding tobacco and drug use. Limiting alcohol use. Practicing safe sex. Taking low doses of aspirin every day. Taking vitamin and mineral supplements as recommended by your health care provider. What happens during an annual well check? The services  and screenings done by your health care provider during your annual well check will depend on your age, overall health, lifestyle risk factors, and family history of disease. Counseling  Your health care provider may ask you questions about your: Alcohol use. Tobacco use. Drug use. Emotional well-being. Home and relationship well-being. Sexual activity. Eating habits. History of falls. Memory and ability to understand (cognition). Work and work Statistician. Screening  You may have the following tests or measurements: Height, weight, and BMI. Blood pressure. Lipid and cholesterol levels. These may be checked every 5 years, or more frequently if you are over 78 years old. Skin check. Lung cancer screening. You may have this screening every year starting at age 76 if you have a 30-pack-year history of smoking and currently smoke or have quit within the past 15 years. Fecal occult blood test (FOBT) of the stool. You may have this test every year starting at age 78. Flexible sigmoidoscopy or colonoscopy. You may have a sigmoidoscopy every 5 years or a colonoscopy every 10 years starting at age 65. Prostate cancer screening. Recommendations will vary depending on your family history and other risks. Hepatitis C blood test. Hepatitis B blood test. Sexually transmitted disease (STD) testing. Diabetes screening. This is done by checking your blood sugar (glucose) after you have not eaten for a while (fasting). You may have this done every 1-3 years. Abdominal aortic aneurysm (AAA) screening. You may need this if you are a current or former smoker. Osteoporosis. You may be screened starting at age 3 if you are at high risk. Talk with your health care provider about your test results, treatment options, and if necessary, the need  for more tests. Vaccines  Your health care provider may recommend certain vaccines, such as: Influenza vaccine. This is recommended every year. Tetanus, diphtheria,  and acellular pertussis (Tdap, Td) vaccine. You may need a Td booster every 10 years. Zoster vaccine. You may need this after age 33. Pneumococcal 13-valent conjugate (PCV13) vaccine. One dose is recommended after age 69. Pneumococcal polysaccharide (PPSV23) vaccine. One dose is recommended after age 21. Talk to your health care provider about which screenings and vaccines you need and how often you need them. This information is not intended to replace advice given to you by your health care provider. Make sure you discuss any questions you have with your health care provider. Document Released: 04/16/2015 Document Revised: 12/08/2015 Document Reviewed: 01/19/2015 Elsevier Interactive Patient Education  2017 [employer] Prevention in the Home Falls can cause injuries. They can happen to people of all ages. There are many things you can do to make your home safe and to help prevent falls. What can I do on the outside of my home? Regularly fix the edges of walkways and driveways and fix any cracks. Remove anything that might make you trip as you walk through a door, such as a raised step or threshold. Trim any bushes or trees on the path to your home. Use bright outdoor lighting. Clear any walking paths of anything that might make someone trip, such as rocks or tools. Regularly check to see if handrails are loose or broken. Make sure that both sides of any steps have handrails. Any raised decks and porches should have guardrails on the edges. Have any leaves, snow, or ice cleared regularly. Use sand or salt on walking paths during winter. Clean up any spills in your garage right away. This includes oil or grease spills. What can I do in the bathroom? Use night lights. Install grab bars by the toilet and in the tub and shower. Do not use towel bars as grab bars. Use non-skid mats or decals in the tub or shower. If you need to sit down in the shower, use a plastic, non-slip  stool. Keep the floor dry. Clean up any water that spills on the floor as soon as it happens. Remove soap buildup in the tub or shower regularly. Attach bath mats securely with double-sided non-slip rug tape. Do not have throw rugs and other things on the floor that can make you trip. What can I do in the bedroom? Use night lights. Make sure that you have a light by your bed that is easy to reach. Do not use any sheets or blankets that are too big for your bed. They should not hang down onto the floor. Have a firm chair that has side arms. You can use this for support while you get dressed. Do not have throw rugs and other things on the floor that can make you trip. What can I do in the kitchen? Clean up any spills right away. Avoid walking on wet floors. Keep items that you use a lot in easy-to-reach places. If you need to reach something above you, use a strong step stool that has a grab bar. Keep electrical cords out of the way. Do not use floor polish or wax that makes floors slippery. If you must use wax, use non-skid floor wax. Do not have throw rugs and other things on the floor that can make you trip. What can I do with my stairs? Do not leave any items on the stairs.  Make sure that there are handrails on both sides of the stairs and use them. Fix handrails that are broken or loose. Make sure that handrails are as long as the stairways. Check any carpeting to make sure that it is firmly attached to the stairs. Fix any carpet that is loose or worn. Avoid having throw rugs at the top or bottom of the stairs. If you do have throw rugs, attach them to the floor with carpet tape. Make sure that you have a light switch at the top of the stairs and the bottom of the stairs. If you do not have them, ask someone to add them for you. What else can I do to help prevent falls? Wear shoes that: Do not have high heels. Have rubber bottoms. Are comfortable and fit you well. Are closed at the  toe. Do not wear sandals. If you use a stepladder: Make sure that it is fully opened. Do not climb a closed stepladder. Make sure that both sides of the stepladder are locked into place. Ask someone to hold it for you, if possible. Clearly mark and make sure that you can see: Any grab bars or handrails. First and last steps. Where the edge of each step is. Use tools that help you move around (mobility aids) if they are needed. These include: Canes. Walkers. Scooters. Crutches. Turn on the lights when you go into a dark area. Replace any light bulbs as soon as they burn out. Set up your furniture so you have a clear path. Avoid moving your furniture around. If any of your floors are uneven, fix them. If there are any pets around you, be aware of where they are. Review your medicines with your doctor. Some medicines can make you feel dizzy. This can increase your chance of falling. Ask your doctor what other things that you can do to help prevent falls. This information is not intended to replace advice given to you by your health care provider. Make sure you discuss any questions you have with your health care provider. Document Released: 01/14/2009 Document Revised: 08/26/2015 Document Reviewed: 04/24/2014 Elsevier Interactive Patient Education  2017 [employer].

## 2022-03-14 NOTE — Progress Notes (Signed)
Annual Wellness Visit    Virtual Visit via Telephone Note  I connected with  Evan Rivas on 03/14/22 at 8:20 AM  by telephone and verified that I am speaking with the correct person using two identifiers.  Location: Patient: at home in [place], [place]  Provider: [facility], [place], [place]     I discussed the limitations, risks, security and privacy concerns of performing an evaluation and management service by telephone and the availability of in person appointments. The patient expressed understanding and agreed to proceed.  Interactive audio and video telecommunications were attempted between this nurse and patient, however failed, due to patient having technical difficulties OR patient did not have access to video capability.  We continued and completed visit with audio only.  Some vital signs may be absent or patient reported    Subjective:   Evan Rivas is a 71 y.o. male who presents for Medicare Annual/Subsequent preventive examination.  Today's Provider: Talitha Givens, MHS, PA-C Introduced myself to the patient as a PA-C and provided education on APPs in clinical practice.     Review of Systems:         Objective:    Vitals: There were no vitals taken for this visit.  There is no height or weight on file to calculate BMI.     03/10/2021    8:24 AM 01/17/2021   11:41 AM 01/11/2021    8:14 AM 06/16/2020    1:00 PM 06/15/2020    9:15 AM 01/15/2020    2:18 PM 11/24/2019   10:13 AM  Advanced Directives  Does Patient Have a Medical Advance Directive? _0  No No  Would patient like information on creating a medical advance directive? No - Patient declined No - Patient declined No - Patient declined No - Patient declined  No - Patient declined No - Patient declined    Tobacco Social History   Tobacco Use  Smoking Status Never  Smokeless Tobacco Never     Counseling given: Not Answered   Clinical  Intake:  Pre-visit preparation completed: Yes  Pain : No/denies pain     Nutritional Status: BMI > 30  Obese Nutritional Risks: None Diabetes: Yes CBG done?: No Did pt. bring in CBG monitor from home?: No  How often do you need to have someone help you when you read instructions, pamphlets, or other written materials from your doctor or pharmacy?: 1 - Never What is the last grade level you completed in school?: 1st year College  Interpreter Needed?: No     Past Medical History:  Diagnosis Date   Adrenal hyperplasia ([place])    Anxiety    panic attacks   Arthritis    Basal cell carcinoma 2015   R upper lip - tx with MOHS by Dr. Lacinda Axon   Basal cell carcinoma 08/25/2021   BCC, nasal tip, Mohs completed on 09/30/21   CKD (chronic kidney disease), stage III ([place])    Depression    GERD (gastroesophageal reflux disease)    Gout    Grade II diastolic dysfunction    a.) TTE 02/19/2018 --> LVEF 60-65%, mild LVH, G2DD, mild MR, mildly dilated LA, RV cavity size/wall thickness/systolic function normal   History of kidney stones    Hyperaldosteronism ([place])    a.) secondary to BILATERAL adrenal hyperplasia   Hyperlipidemia    Hypertension    Nerve entrapment syndrome of lower extremity, right 10/24/2019  Pneumonia    2014 and 2018 ( 2018-klebsiella pneumonia    Sleep apnea    T2DM (type 2 diabetes mellitus) ([place])    Past Surgical History:  Procedure Laterality Date   ARTHROSCOPIC REPAIR ACL Left    CIRCUMCISION N/A 01/17/2021   Procedure: CIRCUMCISION ADULT;  Surgeon: Hollice Espy, MD;  Location: [facility] OR[facility];  Service: Urology;  Laterality: N/A;   CYSTOSCOPY W/ URETERAL STENT PLACEMENT Right 09/07/2014   Procedure: CYSTOSCOPY WITH STENT REPLACEMENT;  Surgeon: Hollice Espy, MD;  Location: [facility];  Service: Urology;  Laterality: Right;   CYSTOSCOPY WITH INSERTION OF UROLIFT N/A 11/24/2019   Procedure: CYSTOSCOPY WITH INSERTION OF UROLIFT;  Surgeon: Hollice Espy, MD;   Location: [facility];  Service: Urology;  Laterality: N/A;   CYSTOSCOPY/URETEROSCOPY/HOLMIUM LASER/STENT PLACEMENT Right 08/17/2014   Procedure: CYSTOSCOPY/URETEROSCOPY//STENT PLACEMENT/ attempt of lithotripsy;  Surgeon: Hollice Espy, MD;  Location: [facility];  Service: Urology;  Laterality: Right;   HERNIA REPAIR     JOINT REPLACEMENT     Bilat knees and right hip   MOHS SURGERY     lip   TOTAL HIP ARTHROPLASTY Right 06/16/2020   Procedure: TOTAL HIP ARTHROPLASTY ANTERIOR APPROACH;  Surgeon: Gaynelle Arabian, MD;  Location: [facility];  Service: Orthopedics;  Laterality: Right;  111mn   TOTAL KNEE ARTHROPLASTY Left 04/24/2016   Procedure: LEFT TOTAL KNEE ARTHROPLASTY;  Surgeon: FGaynelle Arabian MD;  Location: [facility];  Service: Orthopedics;  Laterality: Left;  Adductor Block   TOTAL KNEE ARTHROPLASTY Right 10/23/2016   Procedure: RIGHT TOTAL KNEE ARTHROPLASTY;  Surgeon: AGaynelle Arabian MD;  Location[facility];  Service: Orthopedics;  Laterality: Right;   URETEROSCOPY WITH HOLMIUM LASER LITHOTRIPSY Right 09/07/2014   Procedure: URETEROSCOPY WITH HOLMIUM LASER LITHOTRIPSY;  Surgeon: AHollice Espy MD;  Location: [facility];  Service: Urology;  Laterality: Right;   VASECTOMY     Family History  Problem Relation Age of Onset   Hypertension Mother    Cancer Mother    Hyperlipidemia Mother    Arthritis Mother    Stroke Father    Heart disease Father    Obesity Father    Depression Sister    Hypertension Sister    Hyperlipidemia Sister    Learning disabilities Sister    Obesity Sister    Kidney disease Maternal Grandmother    Social History   Socioeconomic History   Marital status: Married    Spouse name: CHoyle Sauer  Number of children: 1   Years of education: Not on file   Highest education level: Not on file  Occupational History   Occupation: retired  Tobacco Use   Smoking status: Never   Smokeless tobacco: Never  Vaping Use   Vaping Use: Never used  Substance and Sexual Activity    Alcohol use: No   Drug use: No   Sexual activity: Not Currently    Birth control/protection: None  Other Topics Concern   Not on file  Social History Narrative   Not on file   Social Determinants of Health   Financial Resource Strain: Low Risk  (03/14/2022)   Overall Financial Resource Strain (CARDIA)    Difficulty of Paying Living Expenses: Not hard at all  Food Insecurity: No Food Insecurity (03/14/2022)   Hunger Vital Sign    Worried About Running Out of Food in the Last Year: Never true    Ran Out of Food in the Last Year: Never true  Transportation Needs: No Transportation Needs (03/14/2022)   [place]  Lack of Transportation (Medical): No    Lack of Transportation (Non-Medical): No  Physical Activity: Insufficiently Active (03/14/2022)   Exercise Vital Sign    Days of Exercise per Week: 4 days    Minutes of Exercise per Session: 30 min  Stress: No Stress Concern Present (03/14/2022)   [place]    Feeling of Stress : Only a little  Social Connections: Moderately Isolated (03/14/2022)   Social Connection and Isolation Panel [NHANES]    Frequency of Communication with Friends and Family: More than three times a week    Frequency of Social Gatherings with Friends and Family: More than three times a week    Attends Religious Services: Never    Marine scientist or Organizations: No    Attends Archivist Meetings: Never    Marital Status: Married    Outpatient Encounter Medications as of 03/14/2022  Medication Sig   aspirin EC 81 MG tablet Take 81 mg by mouth daily. Swallow whole.   atorvastatin (LIPITOR) 40 MG tablet Take 1 tablet (40 mg total) by mouth at bedtime.   clotrimazole (LOTRIMIN) 1 % cream Apply 1 application topically 2 (two) times daily. Apply for 7-10 days.   dapagliflozin propanediol (FARXIGA) 10 MG TABS tablet Take 1 tablet (10 mg total) by mouth daily  before breakfast. Patient receives via [place] Patient Assistance Program   Dulaglutide (TRULICITY) 3 ZT/2.4PY SOPN Inject 3 mg as directed once a week.   DULoxetine (CYMBALTA) 60 MG capsule Take 1 capsule (60 mg total) by mouth daily.   fluticasone (FLONASE) 50 MCG/ACT nasal spray Place 2 sprays into both nostrils daily.   gabapentin (NEURONTIN) 600 MG tablet Take 1 tablet (600 mg total) by mouth 3 (three) times daily.   glipiZIDE (GLUCOTROL) 10 MG tablet TAKE 1 TABLET DAILY BEFORE BREAKFAST   glucose blood (TRUE METRIX BLOOD GLUCOSE TEST) test strip Test  blood sugar twice a day   HYDROcodone-acetaminophen (NORCO) 10-325 MG tablet Take 0.5-1 tablets by mouth every 8 (eight) hours as needed for severe pain. Gradually reducing dose, max #30 for month fill 02/02/2022   HYDROcodone-acetaminophen (NORCO) 10-325 MG tablet Take 0.5-1 tablets by mouth every 8 (eight) hours as needed for severe pain. Gradually reducing dose, max #30 for month fill 03/03/2022   [START ON 04/03/2022] HYDROcodone-acetaminophen (NORCO) 10-325 MG tablet Take 0.5-1 tablets by mouth every 8 (eight) hours as needed for severe pain. Gradually reducing dose, max #30 for month fill 04/03/2022   hydrOXYzine (VISTARIL) 25 MG capsule Take 1 capsule (25 mg total) by mouth at bedtime as needed.   ketoconazole (NIZORAL) 2 % cream Apply to the feet QHS.   melatonin 3 MG TABS tablet Take 3 mg by mouth at bedtime.   metFORMIN (GLUCOPHAGE) 1000 MG tablet TAKE 1 TABLET TWICE A DAY WITH A MEAL   metoprolol succinate (TOPROL-XL) 100 MG 24 hr tablet Take 1 tablet (100 mg total) by mouth daily. Take with or immediately following a meal.   Multiple Vitamin (MULTIVITAMIN) tablet Take 1 tablet by mouth daily.   sildenafil (REVATIO) 20 MG tablet Take 1 tablet (20 mg total) by mouth as needed. Take 1-5 tabs as needed prior to intercourse   terbinafine (LAMISIL) 250 MG tablet Take 1 tablet (250 mg total) by mouth daily. For tinea pedis   tiZANidine (ZANAFLEX) 4  MG tablet TAKE 1 TABLET EVERY 6 HOURS AS NEEDED FOR MUSCLE SPASMS   valsartan-hydrochlorothiazide (DIOVAN-HCT) 320-12.5 MG tablet  Take 1 tablet by mouth daily.   No facility-administered encounter medications on file as of 03/14/2022.    Activities of Daily Living    03/14/2022    8:09 AM 02/02/2022   10:09 AM  In your present state of health, do you have any difficulty performing the following activities:  Hearing? 0 0  Vision? 0 0  Difficulty concentrating or making decisions? 0 0  Walking or climbing stairs? 0 1  Dressing or bathing? 0 0  Doing errands, shopping? 0 0    Patient Care Team: Bo Merino, FNP as PCP - General (Nurse Practitioner) Germaine Pomfret, [facility] (Pharmacist) Murlean Iba, MD as Consulting Physician (Nephrology) End, Harrell Gave, MD as Consulting Physician (Cardiology) Hollice Espy, MD as Consulting Physician (Urology) Ralene Bathe, MD (Dermatology)   Assessment:   This is a routine wellness examination for [place].  Exercise Activities and Dietary recommendations     Goals Addressed   None     Fall Risk:    03/14/2022    8:09 AM 02/02/2022   10:09 AM 11/02/2021   10:39 AM 09/01/2021   10:25 AM 07/04/2021   10:46 AM  Fall Risk   Falls in the past year? 0 0 0 0 0  Number falls in past yr: 0 0 0 0 0  Injury with Fall? 0 0 0 0 0  Risk for fall due to : No Fall Risks      Follow up Falls prevention discussed;Education provided;_0     FALL RISK PREVENTION PERTAINING TO THE HOME:  Any stairs in or around the home? No  If so, are there any without handrails? No   Home free of loose throw rugs in walkways, pet beds, electrical cords, etc? Yes  Adequate lighting in your home to reduce risk of falls? Yes   ASSISTIVE DEVICES UTILIZED TO PREVENT FALLS:  Life alert? No  Use of a cane, walker or w/c? No   Grab bars in the bathroom? No  Shower chair or bench in shower? Yes  Elevated toilet seat or a handicapped toilet? Yes   TIMED UP AND GO:  Was the test performed?  No Telephone visit .  Length of time to ambulate 10 feet: 0 sec.    Depression Screen    03/14/2022    8:09 AM 02/02/2022   10:09 AM 11/02/2021   10:39 AM 09/01/2021   10:37 AM  PHQ 2/9 Scores  PHQ - 2 Score 0 0 2 0  PHQ- 9 Score 0 0 3 0    Cognitive Function        03/14/2022    9:08 AM 09/24/2018    2:47 PM  6CIT Screen  What Year? 0 points 0 points  What month? 0 points 0 points  What time? 0 points 0 points  Count back from 20 0 points 0 points  Months in reverse 0 points 0 points  Repeat phrase 0 points 0 points  Total Score 0 points 0 points    Immunization History  Administered Date(s) Administered   Fluad Quad(high Dose 65+) 11/20/2018, 12/15/2019, 02/10/2021, 02/02/2022   Influenza, High Dose Seasonal PF 12/22/2015, 01/22/2017, 12/18/2017   Influenza, Seasonal, Injecte, Preservative Fre 12/18/2011   Influenza,inj,Quad PF,6+ Mos 01/06/2013, 12/05/2013, 01/06/2015   Influenza-Unspecified 12/02/2013   PFIZER(Purple Top)SARS-COV-2 Vaccination 06/05/2019, 06/26/2019, 03/03/2020   Pneumococcal Conjugate-13 09/17/2017   Pneumococcal Polysaccharide-23 07/20/2016   Tdap 06/05/2013  Qualifies for Shingles Vaccine? Yes  Not interested. Due for Shingrix. Education has been provided regarding the importance of this vaccine. Pt has been advised to call insurance company to determine out of pocket expense. Advised may also receive vaccine at local pharmacy or Health Dept. Verbalized acceptance and understanding.  Tdap: Completed 06/05/2013  Flu Vaccine:  Completed 02/02/2022  Pneumococcal Vaccine: Completed  Covid-19 Vaccine: Completed vaccines  Screening Tests Health Maintenance  Topic Date Due   Medicare Annual Wellness (AWV)  03/10/2022   COVID-19 Vaccine (4 - 2023-24 season) 03/30/2022 (Originally  12/02/2021)   Zoster Vaccines- Shingrix (1 of 2) 06/13/2022 (Originally 11/16/1969)   Hepatitis C Screening  01/17/2024 (Originally 11/16/1968)   OPHTHALMOLOGY EXAM  06/17/2022   HEMOGLOBIN A1C  08/03/2022   Diabetic kidney evaluation - eGFR measurement  02/03/2023   Diabetic kidney evaluation - Urine ACR  02/03/2023   FOOT EXAM  02/03/2023   DTaP/Tdap/Td (2 - Td or Tdap) 06/06/2023   COLONOSCOPY (Pts 45-61yr Insurance coverage will need to be confirmed)  10/26/2030   Pneumonia Vaccine 71 Years old  Completed   INFLUENZA VACCINE  Completed   HPV VACCINES  Aged Out   Cancer Screenings:  Colorectal Screening: Completed 10/25/2020. Repeat every 10 years  Lung Cancer Screening: (Low Dose CT Chest recommended if Age 748-80years, 30 pack-year currently smoking OR have quit w/in 15years.) does not qualify.     Additional Screening:  Hepatitis C Screening: does qualify; not completed  Vision Screening: Recommended annual ophthalmology exams for early detection of glaucoma and other disorders of the eye. Is the patient up to date with their annual eye exam?  Yes  Who is the provider or what is the name of the office in which the pt attends annual eye exams? [facility]   Dental Screening: Recommended annual dental exams for proper oral hygiene  Community Resource Referral:  CRR required this visit?  No        Plan:  I have personally reviewed and addressed the Medicare Annual Wellness questionnaire and have noted the following in the patient's chart:  A. Medical and social history B. Use of alcohol, tobacco or illicit drugs  C. Current medications and supplements D. Functional ability and status E.  Nutritional status F.  Physical activity G. Advance directives H. List of other physicians I.  Hospitalizations, surgeries, and ER visits in previous 12 months J.  VStanhopesuch as hearing and vision if needed, cognitive and depression L. Referrals and  appointments   In addition, I have reviewed and discussed with patient certain preventive protocols, quality metrics, and best practice recommendations. A written personalized care plan for preventive services as well as general preventive health recommendations were provided to patient.   Signed,   ETalitha Givens MHS, PA-C [facility]

## 2022-03-20 ENCOUNTER — Other Ambulatory Visit: Payer: Self-pay | Admitting: Unknown Physician Specialty

## 2022-03-20 DIAGNOSIS — I10 Essential (primary) hypertension: Secondary | ICD-10-CM

## 2022-03-21 NOTE — Telephone Encounter (Signed)
Requested Prescriptions  Pending Prescriptions Disp Refills   atorvastatin (LIPITOR) 40 MG tablet [Pharmacy Med Name: ATORVASTATIN TABS 40MG] 90 tablet 3    Sig: TAKE 1 TABLET AT BEDTIME     Cardiovascular:  Antilipid - Statins Failed - 03/20/2022  2:24 AM      Failed - Lipid Panel in normal range within the last 12 months    Cholesterol, Total  Date Value Ref Range Status  09/17/2017 140 100 - 199 mg/dL Final   Cholesterol  Date Value Ref Range Status  02/02/2022 149 <200 mg/dL Final   LDL Cholesterol (Calc)  Date Value Ref Range Status  02/02/2022 80 mg/dL (calc) Final    Comment:    Reference range: <100 . Desirable range <100 mg/dL for primary prevention;   <70 mg/dL for patients with CHD or diabetic patients  with > or = 2 CHD risk factors. [name] LDL-C is now calculated using the Martin-Hopkins  calculation, which is a validated novel method providing  better accuracy than the Friedewald equation in the  estimation of LDL-C.  Cresenciano Genre et al. Annamaria Helling. 3154;008(67): 2061-2068  (http://education.QuestDiagnostics.com/faq/FAQ164)    HDL  Date Value Ref Range Status  02/02/2022 38 (L) > OR = 40 mg/dL Final  09/17/2017 32 (L) >39 mg/dL Final   Triglycerides  Date Value Ref Range Status  02/02/2022 211 (H) <150 mg/dL Final    Comment:    . If a non-fasting specimen was collected, consider repeat triglyceride testing on a fasting specimen if clinically indicated.  Yates Decamp et al. J. of Clin. Lipidol. 6195;0:932-671. [name]          Passed - Patient is not pregnant      Passed - Valid encounter within last 12 months    Recent Outpatient Visits           1 week ago Encounter for [employer] annual wellness exam   [facility] Mecum, Erin E, PA-C   1 month ago DDD (degenerative disc disease), lumbar   [facility] Bo Merino, FNP   4 months ago DDD (degenerative disc disease), lumbar   [facility] Bo Merino, FNP   6 months ago DDD (degenerative disc disease), lumbar   [facility] Bo Merino, FNP   8 months ago DDD (degenerative disc disease), lumbar   [facility] Bo Merino, FNP       Future Appointments             In 1 month Reece Packer, Myna Hidalgo, [facility], [place]   In 5 months Ralene Bathe, MD [place]   In 9 months Hollice Espy, MD [place]   In 39 months  [facility], [facility]             valsartan-hydrochlorothiazide (DIOVAN-HCT) 320-12.5 MG tablet [Pharmacy Med Name: VALSARTAN/HCTZ TABS 320/12.5MG] 90 tablet 3    Sig: TAKE 1 TABLET DAILY     Cardiovascular: ARB + Diuretic Combos Passed - 03/20/2022  2:24 AM      Passed - K in normal range and within 180 days    Potassium  Date Value Ref Range Status  02/02/2022 4.8 3.5 - 5.3 mmol/L Final         Passed - Na in normal range and within 180 days    Sodium  Date Value Ref Range Status  02/02/2022 142 135 - 146 mmol/L Final  03/19/2019 142 134 - 144 mmol/L Final         Passed - Cr in normal range and within 180 days    Creat  Date Value Ref Range Status  02/02/2022 1.46 (H) 0.70 - 1.28 mg/dL Final   Creatinine, Urine  Date Value Ref Range Status  02/02/2022 55 20 - 320 mg/dL Final         Passed - eGFR is 10 or above and within 180 days    GFR, Est African American  Date Value Ref Range Status  03/16/2020 51 (L) > OR = 60 mL/min/1.66m Final   GFR, Est Non African American  Date Value Ref Range Status  03/16/2020 44 (L) > OR = 60 mL/min/1.73mFinal   GFR, Estimated  Date Value Ref Range Status  01/12/2021 53 (L) >60 mL/min Final    Comment:    (NOTE) Calculated using the CKD-EPI Creatinine Equation (2021)    eGFR  Date Value Ref Range Status  02/02/2022 51 (L) > OR = 60 mL/min/1.7334minal         Passed - Patient is not pregnant      Passed - Last BP in normal range     BP Readings from Last 1 Encounters:  02/02/22 132/84         Passed - Valid encounter within last 6 months    Recent Outpatient Visits           1 week ago Encounter for Med[employer]ual wellness exam   [facility] EriDani GobbleA-[facility]   1 month ago DDD (degenerative disc disease), lumbar   [facility]   4 months ago DDD ([facility]generative disc disease), lumbar   [facility]   6 months ago DDD (degenerative disc disease), lumbar   [facility]   8 months ago DDD (degenerative disc disease), lumbar   [facility]       Future Appointments             In 1 month PenReece PackerulMyna HidalgoNPBearden [facility] CenterECElburnIn 5 months KowRalene BatheD [place] 9 months BraHollice EspyD ConPleasantonIn 12 23 monthsHMSummerville [facility]

## 2022-03-22 IMAGING — DX DG PORTABLE PELVIS
1 series · 1 of 1 positions shown · non-contrast
Comparison: None.

CLINICAL DATA: RIGHT hip replacement

EXAM:
PORTABLE PELVIS 1-2 VIEWS

[pelvis ap]
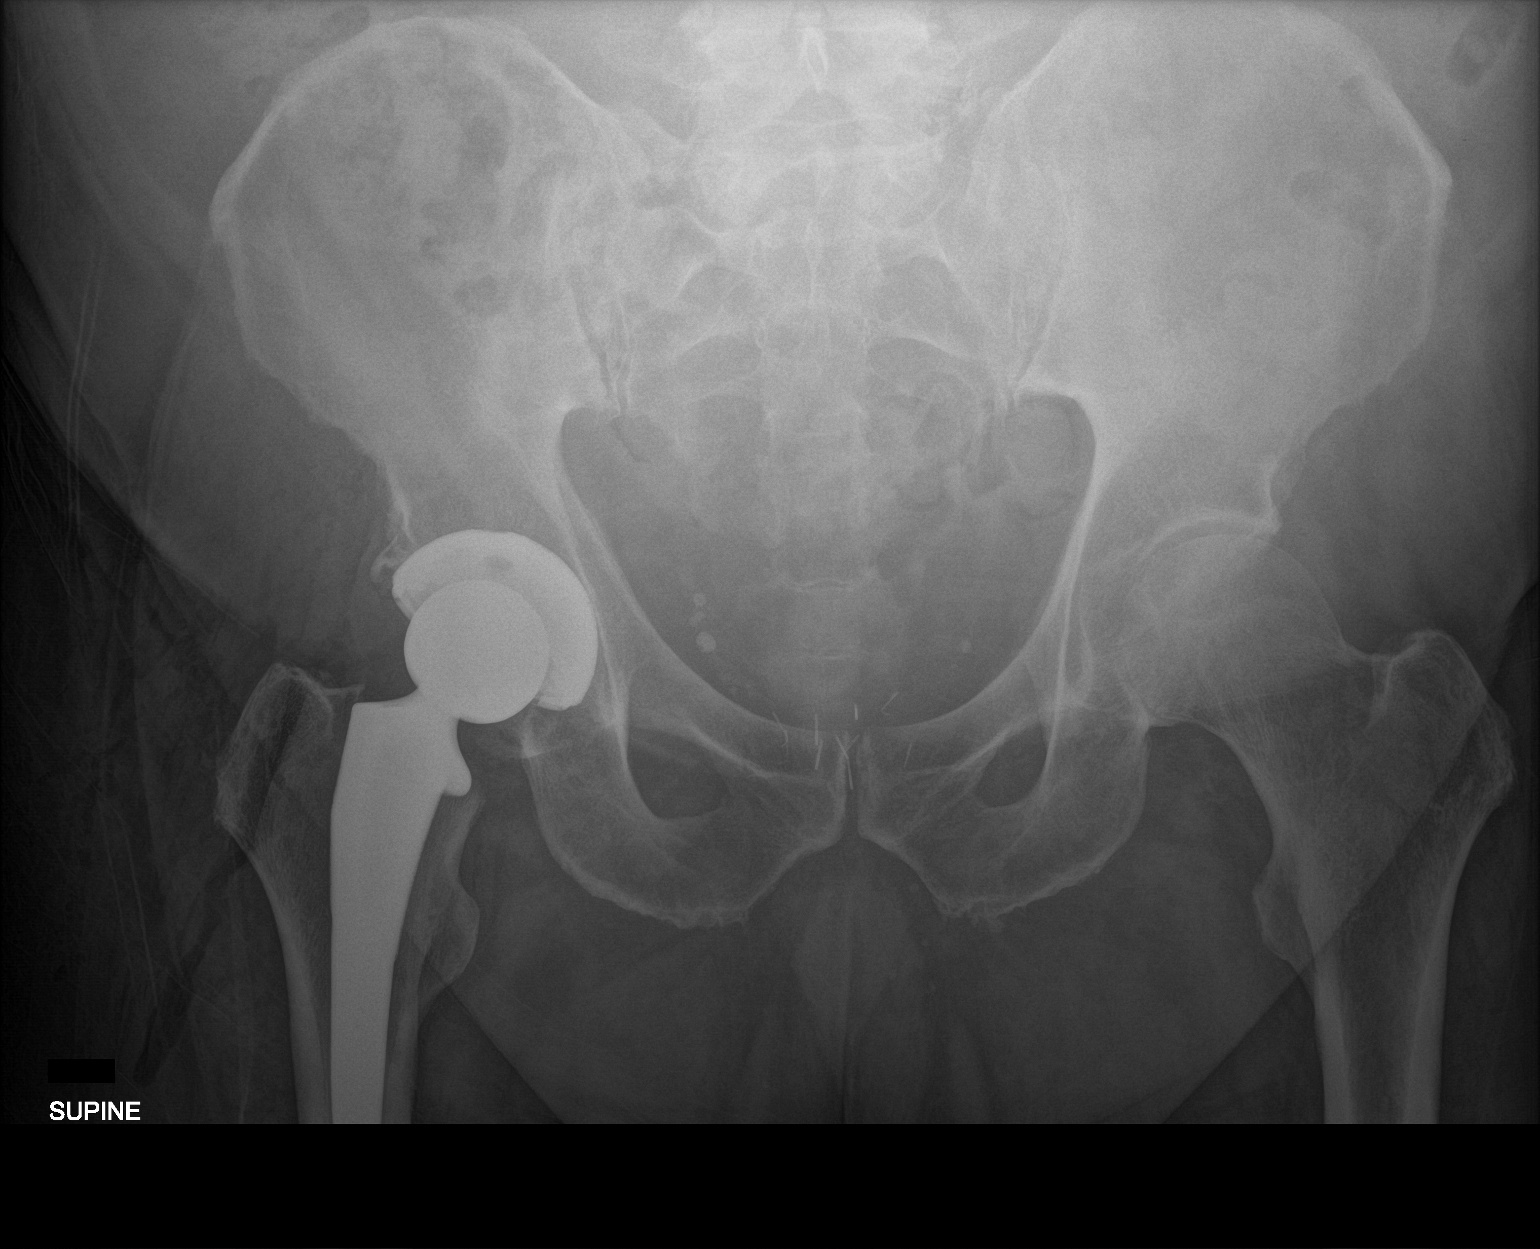

[1 of 1 positions shown; findings below may reference images not displayed]

FINDINGS: RIGHT total arthroplasty of the RIGHT hip. No complicating features.
Brachytherapy seeds in the prostate gland noted.
IMPRESSION: No complication following total hip arthroplasty

## 2022-03-22 IMAGING — RF DG C-ARM 1-60 MIN-NO REPORT
1 series · 2 of 2 positions shown · non-contrast
Comparison: 09/04/2019

CLINICAL DATA: Right hip arthroplasty

EXAM:
OPERATIVE RIGHT HIP (WITH PELVIS IF PERFORMED) AP VIEWS
TECHNIQUE: Fluoroscopic spot image(s) were submitted for interpretation
post-operatively.

[Series 1: unknown protocol · 0.20mm/px · 2 of 2 slices shown]
[im 1/2]
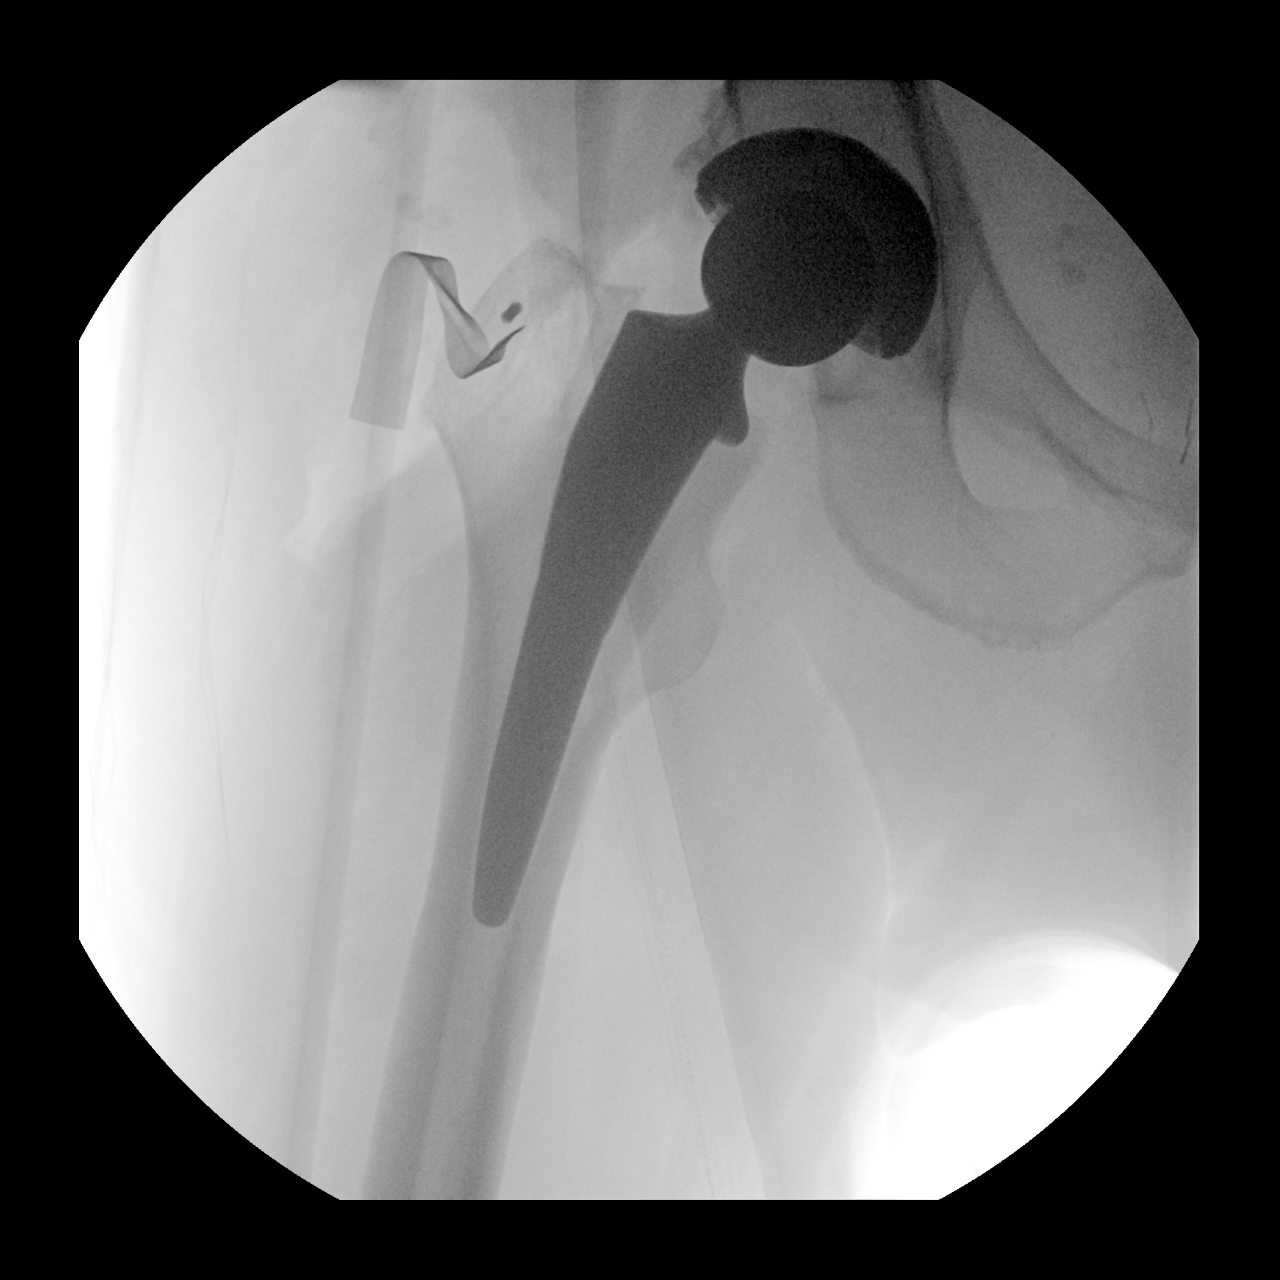
[im 2/2]
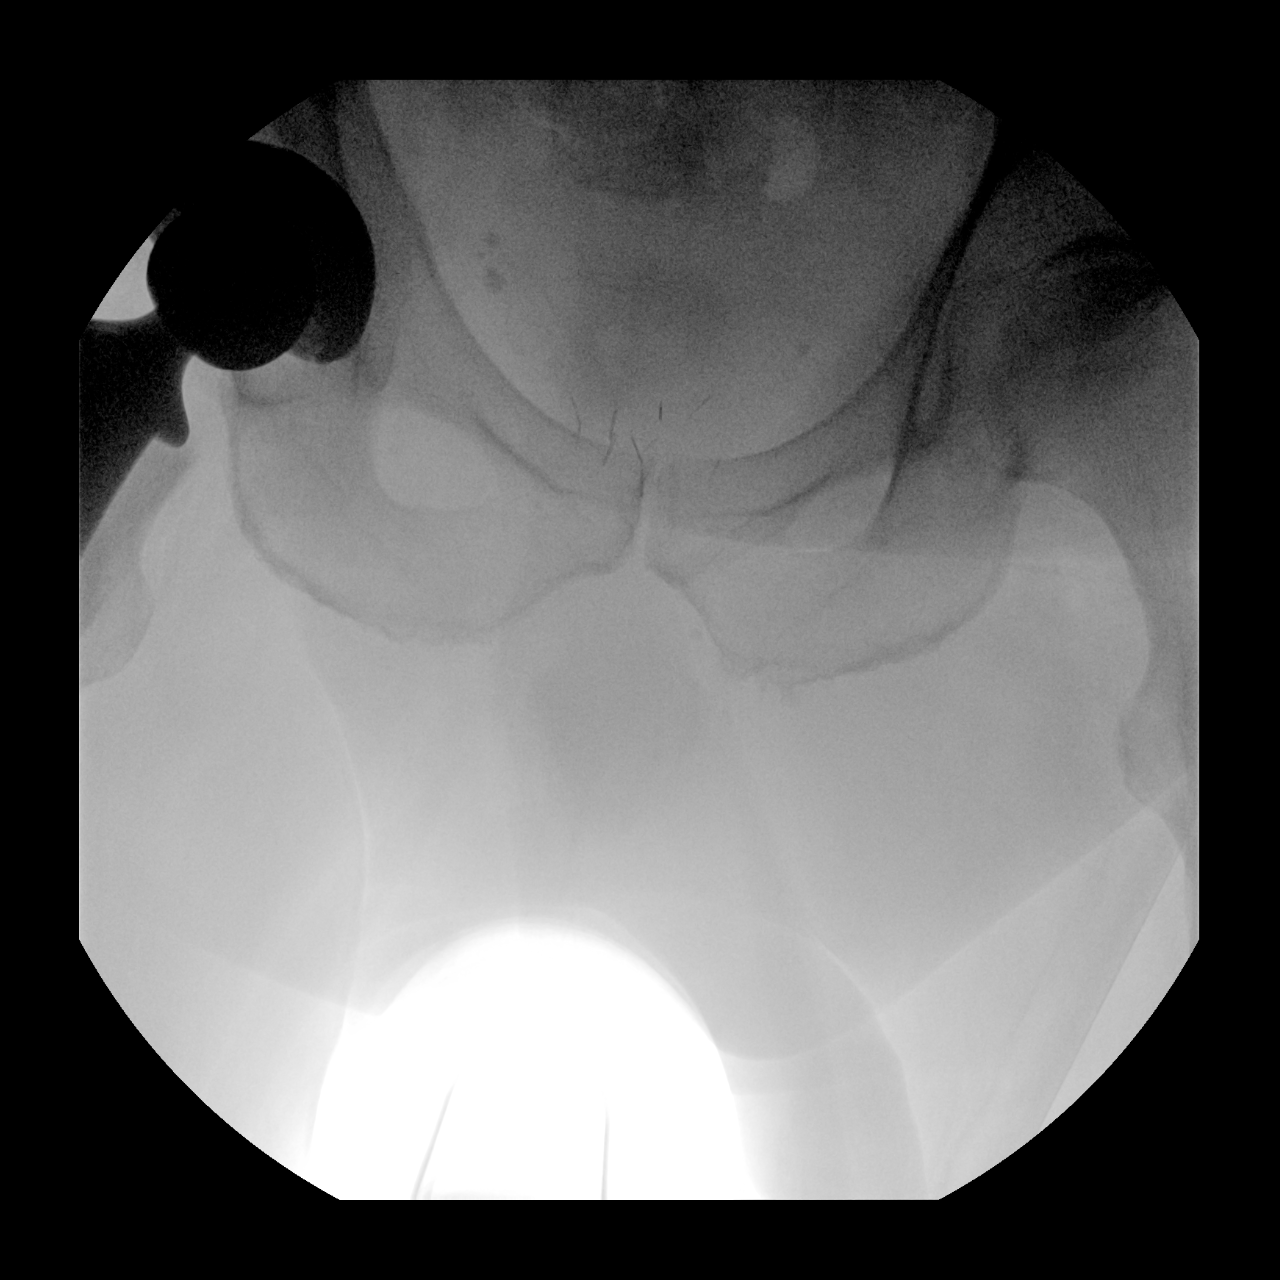

[2 of 2 positions shown; findings below may reference images not displayed]

FINDINGS: 2 C-arm fluoroscopic images were obtained intraoperatively and
submitted for post operative interpretation. Radiopaque marker lap
sponge projects over the greater trochanter on initial image.
Hardware appears appropriately position without evidence of
periprosthetic fracture. Please see the performing provider's
procedural report for further detail.
IMPRESSION: 1. Intraoperative fluoroscopy from right total hip arthroplasty.
2. Radiopaque marker of a lap sponge projects over the greater
trochanter on initial image.

## 2022-03-29 ENCOUNTER — Ambulatory Visit: Payer: Self-pay | Admitting: *Deleted

## 2022-03-29 NOTE — Telephone Encounter (Signed)
Summary: cough, requesting Z PACK   Patient currently in the ED with wife because she has pneumonia. ED doctor advised patient to call PCP and request a ZPACK due to the following symptoms,  ? fluid in chest, cough,for 3 weeks. Patient declined appointment because he is at the hospital. Informed patient PCP would prefer to see him.       [facility] [address] [place], [place] [employer] [address] Phone: [phone] Fax: [phone]

## 2022-03-29 NOTE — Telephone Encounter (Signed)
Lvm for pt to call and schedule an appt for cough

## 2022-03-29 NOTE — Telephone Encounter (Signed)
    Chief Complaint: Non-productive cough x 3 weeks. With wife in ED and cannot come in for OV. Asking for antibiotic to be called in. Symptoms: Above Frequency: 3 weeks ago Pertinent Negatives: Patient denies SOB Disposition: '[]'$ ED /'[]'$ Urgent Care (no appt availability in office) / '[]'$ Appointment(In office/virtual)/ '[]'$  [place] [facility]/ '[]'$ Home Care/ '[]'$ Refused Recommended Disposition /'[]'$ [place] [facility]/ '[x]'$  Follow-up with PCP Additional Notes: Please advise pt.  Answer Assessment - Initial Assessment Questions 1. ONSET: "When did the cough begin?"      3 weeks ago 2. SEVERITY: "How bad is the cough today?"      Severe 3. SPUTUM: "Describe the color of your sputum" (none, dry cough; clear, white, yellow, green)     None 4. HEMOPTYSIS: "Are you coughing up any blood?" If so ask: "How much?" (flecks, streaks, tablespoons, etc.)     No 5. DIFFICULTY BREATHING: "Are you having difficulty breathing?" If Yes, ask: "How bad is it?" (e.g., mild, moderate, severe)    - MILD: No SOB at rest, mild SOB with walking, speaks normally in sentences, can lie down, no retractions, pulse < 100.    - MODERATE: SOB at rest, SOB with minimal exertion and prefers to sit, cannot lie down flat, speaks in phrases, mild retractions, audible wheezing, pulse 100-120.    - SEVERE: Very SOB at rest, speaks in single words, struggling to breathe, sitting hunched forward, retractions, pulse > 120      No 6. FEVER: "Do you have a fever?" If Yes, ask: "What is your temperature, how was it measured, and when did it start?"     Low grade 7. CARDIAC HISTORY: "Do you have any history of heart disease?" (e.g., heart attack, congestive heart failure)      No 8. LUNG HISTORY: "Do you have any history of lung disease?"  (e.g., pulmonary embolus, asthma, emphysema)     No 9. PE RISK FACTORS: "Do you have a history of blood clots?" (or: recent major surgery, recent prolonged travel, bedridden)     No 10. OTHER SYMPTOMS:  "Do you have any other symptoms?" (e.g., runny nose, wheezing, chest pain)       No 11. PREGNANCY: "Is there any chance you are pregnant?" "When was your last menstrual period?"       N/a 12. TRAVEL: "Have you traveled out of the country in the last month?" (e.g., travel history, exposures)       No  Protocols used: Cough - Acute Non-Productive-A-AH

## 2022-03-30 ENCOUNTER — Ambulatory Visit (INDEPENDENT_AMBULATORY_CARE_PROVIDER_SITE_OTHER): Payer: Medicare Other | Admitting: Nurse Practitioner

## 2022-03-30 ENCOUNTER — Encounter: Payer: Self-pay | Admitting: Nurse Practitioner

## 2022-03-30 VITALS — BP 138/76 | HR 91 | Temp 98.1°F | Resp 18 | Ht 75.0 in | Wt 254.1 lb

## 2022-03-30 DIAGNOSIS — R051 Acute cough: Secondary | ICD-10-CM

## 2022-03-30 DIAGNOSIS — J069 Acute upper respiratory infection, unspecified: Secondary | ICD-10-CM | POA: Diagnosis not present

## 2022-03-30 DIAGNOSIS — J029 Acute pharyngitis, unspecified: Secondary | ICD-10-CM

## 2022-03-30 LAB — POCT INFLUENZA A/B
Influenza A, POC: NEGATIVE
Influenza B, POC: NEGATIVE

## 2022-03-30 LAB — POCT RAPID STREP A (OFFICE): Rapid Strep A Screen: NEGATIVE

## 2022-03-30 MED ORDER — AMOXICILLIN-POT CLAVULANATE 875-125 MG PO TABS: 1.0000 | ORAL_TABLET | Freq: Two times a day (BID) | ORAL | 0 refills | Status: DC

## 2022-03-30 MED ORDER — BENZONATATE 100 MG PO CAPS: 200.0000 mg | ORAL_CAPSULE | Freq: Two times a day (BID) | ORAL | 0 refills | Status: DC | PRN

## 2022-03-30 NOTE — Progress Notes (Signed)
BP 138/76   Pulse 91   Temp 98.1 F (36.7 C)   Resp 18   Ht '6\' 3"'$  (1.905 m)   Wt 254 lb 1.6 oz (115.3 kg)   SpO2 94%   BMI 31.76 kg/m    Subjective:    Patient ID: Evan Rivas, male    DOB: November 14, 1950, 71 y.o.   MRN: [id number]  HPI: Evan Rivas is a 71 y.o. male  Chief Complaint  Patient presents with   Cough    Sore throat, Congestion x2 weeks   URI: patient reports he has had symptoms going on two weeks. He says that he has a sore throat and nasal congestion.  He denies any fever or shortness of breath. He has been taking mucinex. Obtained rapid strep, covid and flu test.  Rapid strep and flu were negative. However, exam shows lymphadenopathy, throat is erythematous with exudate. Will treat with Augmentin. Recommend patient push fluids, take zyrtec, flonase and continue mucinex.    Relevant past medical, surgical, family and social history reviewed and updated as indicated. Interim medical history since our last visit reviewed. Allergies and medications reviewed and updated.  Review of Systems  Constitutional: Negative for fever or weight change.  HEENT: positive for sore throat Respiratory:positive for cough and  negative for shortness of breath.   Cardiovascular: Negative for chest pain or palpitations.  Gastrointestinal: Negative for abdominal pain, no bowel changes.  Musculoskeletal: Negative for gait problem or joint swelling.  Skin: Negative for rash.  Neurological: Negative for dizziness or headache.  No other specific complaints in a complete review of systems (except as listed in HPI above).      Objective:    BP 138/76   Pulse 91   Temp 98.1 F (36.7 C)   Resp 18   Ht '6\' 3"'$  (1.905 m)   Wt 254 lb 1.6 oz (115.3 kg)   SpO2 94%   BMI 31.76 kg/m   Wt Readings from Last 3 Encounters:  03/30/22 254 lb 1.6 oz (115.3 kg)  02/02/22 256 lb 9.6 oz (116.4 kg)  12/27/21 257 lb (116.6 kg)    Physical Exam  Constitutional: Patient appears  well-developed and well-nourished. Obese  No distress.  HEENT: head atraumatic, normocephalic, pupils equal and reactive to light, ears TMs clear, neck supple, throat erythematous with exudate Cardiovascular: Normal rate, regular rhythm and normal heart sounds.  No murmur heard. No BLE edema. Pulmonary/Chest: Effort normal and breath sounds normal. No respiratory distress. Abdominal: Soft.  There is no tenderness. Psychiatric: Patient has a normal mood and affect. behavior is normal. Judgment and thought content normal.  Results for orders placed or performed in visit on 03/30/22  POCT rapid strep A  Result Value Ref Range   Rapid Strep A Screen Negative Negative  POCT Influenza A/B  Result Value Ref Range   Influenza A, POC Negative Negative   Influenza B, POC Negative Negative      Assessment & Plan:   Problem List Items Addressed This Visit   None Visit Diagnoses     Viral upper respiratory tract infection    -  Primary   start zyrtec, flonase continue mucinex.  push fluids and get rest   Relevant Orders   Novel Coronavirus, NAA ([facility])   POCT Influenza A/B (Completed)   Acute cough       sent in tessalon perls , continue mucinex   Relevant Medications   benzonatate (TESSALON) 100 MG capsule   Other Relevant  Orders   Novel Coronavirus, NAA ([facility])   Sore throat       will treat based on physical exam.  start augmentin.   Relevant Medications   amoxicillin-clavulanate (AUGMENTIN) 875-125 MG tablet   Other Relevant Orders   Novel Coronavirus, NAA ([facility])   POCT rapid strep A (Completed)        Follow up plan: Return if symptoms worsen or fail to improve.

## 2022-04-01 LAB — NOVEL CORONAVIRUS, NAA: SARS-CoV-2, NAA: NOT DETECTED

## 2022-04-18 ENCOUNTER — Other Ambulatory Visit: Payer: Self-pay | Admitting: Unknown Physician Specialty

## 2022-04-18 DIAGNOSIS — F331 Major depressive disorder, recurrent, moderate: Secondary | ICD-10-CM

## 2022-04-18 DIAGNOSIS — F419 Anxiety disorder, unspecified: Secondary | ICD-10-CM

## 2022-04-18 NOTE — Telephone Encounter (Signed)
Requested Prescriptions  Pending Prescriptions Disp Refills   DULoxetine (CYMBALTA) 60 MG capsule [Pharmacy Med Name: DULOXETINE HCL DR CAPS '60MG'$ ] 90 capsule 0    Sig: TAKE 1 CAPSULE DAILY (MUST ATTEND DECEMBER APPOINTMENT FOR FURTHER REFILLS)     Psychiatry: Antidepressants - SNRI - duloxetine Passed - 04/18/2022 10:33 AM      Passed - Cr in normal range and within 360 days    Creat  Date Value Ref Range Status  02/02/2022 1.46 (H) 0.70 - 1.28 mg/dL Final   Creatinine, Urine  Date Value Ref Range Status  02/02/2022 55 20 - 320 mg/dL Final         Passed - eGFR is 30 or above and within 360 days    GFR, Est African American  Date Value Ref Range Status  03/16/2020 51 (L) > OR = 60 mL/min/1.67m Final   GFR, Est Non African American  Date Value Ref Range Status  03/16/2020 44 (L) > OR = 60 mL/min/1.763mFinal   GFR, Estimated  Date Value Ref Range Status  01/12/2021 53 (L) >60 mL/min Final    Comment:    (NOTE) Calculated using the CKD-EPI Creatinine Equation (2021)    eGFR  Date Value Ref Range Status  02/02/2022 51 (L) > OR = 60 mL/min/1.7368minal         Passed - Completed PHQ-2 or PHQ-9 in the last 360 days      Passed - Last BP in normal range    BP Readings from Last 1 Encounters:  03/30/22 138/76         Passed - Valid encounter within last 6 months    Recent Outpatient Visits           2 weeks ago Viral upper respiratory tract infection   [facility]   1 month ago Encounter for [employer] wellness exam   [facility] EriDani GobbleA-[facility]   2 months ago DDD (degenerative disc disease), lumbar   [facility]   5 months ago DDD (degenerative disc disease), lumbar   [facility]   7 months ago DDD (degenerative disc disease), lumbar   [facility]       Future  Appointments             In 2 weeks PenReece PackerulMyna HidalgoNPStan[facility] CenterECRutherfordIn 4 months KowRalene BatheD [place] 8 months BraHollice EspyD [place]In 11 7 monthsHMP[facility]

## 2022-04-21 ENCOUNTER — Other Ambulatory Visit: Payer: Self-pay | Admitting: Unknown Physician Specialty

## 2022-04-21 DIAGNOSIS — I10 Essential (primary) hypertension: Secondary | ICD-10-CM

## 2022-04-24 NOTE — Telephone Encounter (Signed)
Requested medication (s) are due for refill today: Yes  Requested medication (s) are on the active medication list: Yes  Last refill:  04/12/21  Future visit scheduled: Yes  Notes to clinic:  Prescription has expired.    Requested Prescriptions  Pending Prescriptions Disp Refills   metoprolol succinate (TOPROL-XL) 100 MG 24 hr tablet [Pharmacy Med Name: METOPROLOL SUCCINATE ER TABS '100MG'$ ] 90 tablet 3    Sig: TAKE 1 TABLET DAILY WITH OR IMMEDIATELY FOLLOWING A MEAL     Cardiovascular:  Beta Blockers Passed - 04/21/2022 11:26 AM      Passed - Last BP in normal range    BP Readings from Last 1 Encounters:  03/30/22 138/76         Passed - Last Heart Rate in normal range    Pulse Readings from Last 1 Encounters:  03/30/22 91         Passed - Valid encounter within last 6 months    Recent Outpatient Visits           3 weeks ago Viral upper respiratory tract infection   [place], Julie F, FNP   1 month ago Encounter for [employer] annual wellness exam   [facility] Mecum, Dani Gobble, PA-C   2 months ago DDD (degenerative disc disease), lumbar   [facility] Bo Merino, FNP   5 months ago DDD (degenerative disc disease), lumbar   [facility] Bo Merino, FNP   7 months ago DDD (degenerative disc disease), lumbar   [facility] Bo Merino, FNP       Future Appointments             In 1 week Reece Packer, Myna Hidalgo, [facility], [place]   In 4 months Ralene Bathe, MD [place]   In 8 months Hollice Espy, MD [place]   In 11 months  [facility], [facility]

## 2022-04-25 ENCOUNTER — Telehealth: Payer: Self-pay

## 2022-04-25 ENCOUNTER — Other Ambulatory Visit: Payer: Self-pay | Admitting: Nurse Practitioner

## 2022-04-25 ENCOUNTER — Encounter: Payer: Self-pay | Admitting: Nurse Practitioner

## 2022-04-25 MED ORDER — DAPAGLIFLOZIN PROPANEDIOL 10 MG PO TABS: 10.0000 mg | ORAL_TABLET | Freq: Every day | ORAL | 3 refills | Status: DC

## 2022-04-25 NOTE — Telephone Encounter (Unsigned)
Copied from [place] [phone]. Topic: General - Other >> Apr 25, 2022 12:42 PM Cyndi Bender wrote: Reason for CRM: Pt requests that Serafina Royals or Bonnita Nasuti contact him regarding the Rx for dapagliflozin propanediol (FARXIGA) 10 MG TABS tablet. Pt stated that he needs the Rx to be sent to [facility] at fax# [phone] for a 1 year supply.

## 2022-04-25 NOTE — Telephone Encounter (Signed)
Refill for Farxiga sent into [facility].

## 2022-04-26 NOTE — Telephone Encounter (Signed)
Spoke to [facility] and Wilder Glade has been sent

## 2022-05-03 NOTE — Progress Notes (Signed)
There were no vitals taken for this visit.   Subjective:    Patient ID: Evan Rivas, male    DOB: Mar 04, 1951, 72 y.o.   MRN: [id number]  HPI: Evan Rivas is a 72 y.o. male  No chief complaint on file.  Back pain/DDD/continuous opioid dependence: patient is currently taking gabapentin 600 mg three times a day, cymbalta 60 mg daily, tizanidine when needed. He has been working on tapering down off hydrocodone.  The plan is to taper down 5-10 % each refill.  Last visit we decreased to 30 tablets.  Patient has chronic back pain and DDD.    Hyperlipidemia/Aortic atherosclerosis:currently takes atorvastatin 40 mg daily.  He denies any myalgia.  His last LDL was 80 his triglycerides were 211. He is currently taking atrovastatin 40 mg daily.   Hypertension: patient's blood pressure today is ***.  He goes to the gym regularly. He denies any chest pain, shortness of breath, headaches or blurred cision.  He currently takes metoprolol 100 mg daily and valsartan-hydrochlorothiazide 320-12.5 mg daily.    Diabetes: His last A1C was 8.7 on 02/02/2022.  He says his blood sugar has been running around ***.  He is currently taking trulicity 3 mg weekly, farxiga 10 mg daily, metformin 1000 mg twice daily and glipizide 10 mg daily.  He denies any polyphagia, polydipsia or polyuria, or hypoglycemic episodes. He is up to date on his foot and eye exam.    CKD: His last GFR was 53 on 02/15/2022.  He last saw nephrology on 02/20/2022. No changes were made on plan of care. He continues to take farxiga 10 mg daily, valsartan-hydrochlorothiazide 320-12.5 mg daily.   Obesity:his weight today is *** with a BMI of ***. He works out regularly and tries to eat healthy.    Depression:  patient continues to take cymbalta 60 mg daily.  His PHQ9 and GAD scores are ***.     03/30/2022    8:33 AM 03/14/2022    8:09 AM 02/02/2022   10:09 AM 11/02/2021   10:39 AM 09/01/2021   10:37 AM  Depression screen PHQ 2/9   Decreased Interest 0 0 0 1 0  Down, Depressed, Hopeless 0 0 0 1 0  PHQ - 2 Score 0 0 0 2 0  Altered sleeping 0 0 0 0 0  Tired, decreased energy 0 0 0 1 0  Change in appetite 0 0 0 0   Feeling bad or failure about yourself  0 0 0 0 0  Trouble concentrating 0 0 0 0 0  Moving slowly or fidgety/restless 0 0 0 0 0  Suicidal thoughts 0 0 0 0 0  PHQ-9 Score 0 0 0 3 0  Difficult doing work/chores Not difficult at all Not difficult at all Not difficult at all Not difficult at all Not difficult at all    Insomnia: patient had not been taking his hydroxyzine, reported that at last visit.  Patient was encouraged to take it to help with sleep. Patient reports ***.   ED: patient has been using sildenafil 20 mg when needed for intercourse.  ***  patient reports he recently saw urology for routine follow up and they prescribed him sildenafil 20 mg.  With instructions to take 1-5 tabs as needed prior to intercourse. Patient reports he has not been able to get the prescription. Spoke to [facility] and they have the prescription and it would be approx 378 dollars a month.  Printed off good rx coupon to  get prescription for $21.    Relevant past medical, surgical, family and social history reviewed and updated as indicated. Interim medical history since our last visit reviewed. Allergies and medications reviewed and updated.  Review of Systems  Constitutional: Negative for fever or weight change.  Respiratory: Negative for cough and shortness of breath.   Cardiovascular: Negative for chest pain or palpitations.  Gastrointestinal: Negative for abdominal pain, no bowel changes.  Musculoskeletal: Negative for gait problem or joint swelling.  Positive for back pain Skin: Negative for rash.  Neurological: Negative for dizziness or headache.  No other specific complaints in a complete review of systems (except as listed in HPI above).      Objective:    There were no vitals taken for this visit.  Wt  Readings from Last 3 Encounters:  03/30/22 254 lb 1.6 oz (115.3 kg)  02/02/22 256 lb 9.6 oz (116.4 kg)  12/27/21 257 lb (116.6 kg)    Physical Exam  Constitutional: Patient appears well-developed and well-nourished. Obese  No distress.  HEENT: head atraumatic, normocephalic, pupils equal and reactive to light,  neck supple Cardiovascular: Normal rate, regular rhythm and normal heart sounds.  No murmur heard. No BLE edema. Pulmonary/Chest: Effort normal and breath sounds normal. No respiratory distress. Abdominal: Soft.  There is no tenderness. Psychiatric: Patient has a normal mood and affect. behavior is normal. Judgment and thought content normal.  Diabetic Foot Exam - Simple   No data filed        Assessment & Plan:   Problem List Items Addressed This Visit   None    Follow up plan: No follow-ups on file.

## 2022-05-04 ENCOUNTER — Other Ambulatory Visit: Payer: Self-pay

## 2022-05-04 ENCOUNTER — Encounter: Payer: Self-pay | Admitting: Nurse Practitioner

## 2022-05-04 ENCOUNTER — Ambulatory Visit (INDEPENDENT_AMBULATORY_CARE_PROVIDER_SITE_OTHER): Payer: Medicare Other | Admitting: Nurse Practitioner

## 2022-05-04 VITALS — BP 136/78 | HR 100 | Temp 97.8°F | Resp 18 | Ht 75.0 in | Wt 258.9 lb

## 2022-05-04 DIAGNOSIS — E669 Obesity, unspecified: Secondary | ICD-10-CM

## 2022-05-04 DIAGNOSIS — M545 Low back pain, unspecified: Secondary | ICD-10-CM | POA: Diagnosis not present

## 2022-05-04 DIAGNOSIS — E119 Type 2 diabetes mellitus without complications: Secondary | ICD-10-CM

## 2022-05-04 DIAGNOSIS — E1169 Type 2 diabetes mellitus with other specified complication: Secondary | ICD-10-CM

## 2022-05-04 DIAGNOSIS — F1120 Opioid dependence, uncomplicated: Secondary | ICD-10-CM | POA: Diagnosis not present

## 2022-05-04 DIAGNOSIS — F3341 Major depressive disorder, recurrent, in partial remission: Secondary | ICD-10-CM

## 2022-05-04 DIAGNOSIS — E7800 Pure hypercholesterolemia, unspecified: Secondary | ICD-10-CM

## 2022-05-04 DIAGNOSIS — M5136 Other intervertebral disc degeneration, lumbar region: Secondary | ICD-10-CM

## 2022-05-04 DIAGNOSIS — G8929 Other chronic pain: Secondary | ICD-10-CM

## 2022-05-04 DIAGNOSIS — G4709 Other insomnia: Secondary | ICD-10-CM

## 2022-05-04 DIAGNOSIS — N1832 Chronic kidney disease, stage 3b: Secondary | ICD-10-CM

## 2022-05-04 DIAGNOSIS — I700 Atherosclerosis of aorta: Secondary | ICD-10-CM

## 2022-05-04 DIAGNOSIS — I10 Essential (primary) hypertension: Secondary | ICD-10-CM

## 2022-05-04 MED ORDER — HYDROCODONE-ACETAMINOPHEN 10-325 MG PO TABS: 0.5000 | ORAL_TABLET | Freq: Three times a day (TID) | ORAL | 0 refills | Status: DC | PRN

## 2022-05-04 NOTE — Assessment & Plan Note (Signed)
Continue taking atorvastatin 40 mg daily. 

## 2022-05-04 NOTE — Assessment & Plan Note (Signed)
Will continue patient on hydrocodone 30 tablets/month.  Also taking Cymbalta 60 mg daily, gabapentin 600 mg 3 times a day and tizanidine as needed.

## 2022-05-04 NOTE — Assessment & Plan Note (Signed)
Working on lifestyle modification.

## 2022-05-04 NOTE — Assessment & Plan Note (Signed)
Continue Cymbalta 60 mg daily.

## 2022-05-04 NOTE — Assessment & Plan Note (Signed)
Patient was recently out of [place] and he just started his Trulicity 3 mg dose.  Patient reports his blood sugar was 175 this morning.  Recommend continuing taking Trulicity 3 mg weekly, Farxiga 10 mg daily, metformin 1000 mg twice daily and glipizide 10 mg daily.  Will get labs at next visit.

## 2022-05-04 NOTE — Assessment & Plan Note (Signed)
Continue taking metoprolol 100 mg daily and valsartan-hydrochlorothiazide 320-12.5 mg daily.

## 2022-05-04 NOTE — Assessment & Plan Note (Signed)
try magnesium glycine 8 at bedtime.

## 2022-07-03 ENCOUNTER — Other Ambulatory Visit: Payer: Self-pay | Admitting: Nurse Practitioner

## 2022-07-03 DIAGNOSIS — J301 Allergic rhinitis due to pollen: Secondary | ICD-10-CM

## 2022-07-03 NOTE — Telephone Encounter (Signed)
Requested Prescriptions  Pending Prescriptions Disp Refills   fluticasone (FLONASE) 50 MCG/ACT nasal spray [Pharmacy Med Name: FLUTICASONE PROP NASAL SPRAY 16GM 50MCG] 11 g 0    Sig: USE 2 SPRAYS IN EACH NOSTRIL DAILY     Ear, Nose, and Throat: Nasal Preparations - Corticosteroids Passed - 07/03/2022  2:03 AM      Passed - Valid encounter within last 12 months    Recent Outpatient Visits           2 months ago DDD (degenerative disc disease), lumbar   [facility] Bo Merino, FNP   3 months ago Viral upper respiratory tract infection   [place], Julie F, FNP   3 months ago Encounter for [employer] annual wellness exam   [facility] Mecum, Dani Gobble, PA-C   5 months ago DDD (degenerative disc disease), lumbar   [facility] Bo Merino, FNP   8 months ago DDD (degenerative disc disease), lumbar   [facility] Bo Merino, FNP       Future Appointments             In 3 weeks Reece Packer, Myna Hidalgo, [facility], [place]   In 1 month Ralene Bathe, MD [place]   In 6 months Hollice Espy, MD [place]   In 8 months  [facility], [facility]             glipiZIDE (GLUCOTROL) 10 MG tablet [Pharmacy Med Name: GLIPIZIDE TABS 10MG ] 90 tablet 0    Sig: TAKE 1 TABLET DAILY BEFORE BREAKFAST     Endocrinology:  Diabetes - Sulfonylureas Failed - 07/03/2022  2:03 AM      Failed - HBA1C is between 0 and 7.9 and within 180 days    HbA1c, POC (prediabetic range)  Date Value Ref Range Status  03/25/2018 8.9 (A) 5.7 - 6.4 % Final   HbA1c, POC (controlled diabetic range)  Date Value Ref Range Status  03/25/2018 8.9 (A) 0.0 - 7.0 % Final   HbA1c POC (<> result, manual entry)  Date Value Ref Range Status  03/25/2018 8.9 4.0 - 5.6 % Final   Hgb A1c MFr  Bld  Date Value Ref Range Status  02/02/2022 8.7 (H) <5.7 % of total Hgb Final    Comment:    For someone without known diabetes, a hemoglobin A1c value of 6.5% or greater indicates that they may have  diabetes and this should be confirmed with a follow-up  test. . For someone with known diabetes, a value <7% indicates  that their diabetes is well controlled and a value  greater than or equal to 7% indicates suboptimal  control. A1c targets should be individualized based on  duration of diabetes, age, comorbid conditions, and  other considerations. . Currently, no consensus exists regarding use of hemoglobin A1c for diagnosis of diabetes for children. .          Passed - Cr in normal range and within 360 days    Creat  Date Value Ref Range Status  02/02/2022 1.46 (H) 0.70 - 1.28 mg/dL Final   Creatinine, Urine  Date Value Ref Range Status  02/02/2022 55 20 - 320 mg/dL Final         Passed - Valid encounter within last 6 months    Recent Outpatient Visits  2 months ago DDD (degenerative disc disease), lumbar   [facility] Bo Merino, FNP   3 months ago Viral upper respiratory tract infection   [place], FNP   3 months ago Encounter for [employer] annual wellness exam   [facility] Mecum, Dani Gobble, PA-C   5 months ago DDD (degenerative disc disease), lumbar   [facility] Bo Merino, FNP   8 months ago DDD (degenerative disc disease), lumbar   [facility] Bo Merino, FNP       Future Appointments             In 3 weeks Reece Packer, Myna Hidalgo, [facility], [place]   In 1 month Ralene Bathe, MD [place]   In 6 months Hollice Espy, MD [place]   In 8 months  [facility], [facility]

## 2022-07-25 DIAGNOSIS — K080 Exfoliation of teeth due to systemic causes: Secondary | ICD-10-CM | POA: Diagnosis not present

## 2022-07-27 NOTE — Progress Notes (Signed)
BP 134/76   Pulse 94   Temp 97.7 F (36.5 C) (Oral)   Resp 18   Ht 6\' 3"  (1.905 m)   Wt 251 lb 6.4 oz (114 kg)   SpO2 98%   BMI 31.42 kg/m    Subjective:    Patient ID: Evan Rivas, male    DOB: 07-31-1950, 72 y.o.   MRN: [id number]  HPI: Evan Rivas is a 72 y.o. male  Chief Complaint  Patient presents with   Back Pain   Hyperlipidemia   Hypertension   Diabetes   Back pain/DDD/continuous opioid dependence: Patient is currently taking gabapentin 600 mg 3 times a day, Cymbalta 60 mg daily, tizanidine as needed.  We have been working on tapering down off hydrocodone.  The plan is to taper around 5 to 10% each refill.   Patient does have chronic back pain and DDD.  PDMP was reviewed. Will decrease prescription to 28 pills a month was getting 30 a month.   Hyperlipidemia/Aortic atherosclerosis: His last LDL was 88 on 02/02/2022.  Triglycerides were 211.  He currently takes atorvastatin 40 mg daily.  Denies any myalgia.  Get labs today.  Hypertension: Currently takes metoprolol 100 mg daily and valsartan-hydrochlorothiazide 320-12.5 mg daily.  He goes to the gym regularly.  He denies any chest pain, headaches, shortness of breath or blurred vision. Blood pressure today is 134/76.   Diabetes: His last A1c was 8.7 on 02/02/2022.  Patient reports his blood sugar has been running around 156-188.  He has been taking [place] and he just started his Trulicity 3 mg dose.  He also takes metformin 1000 mg twice daily and glipizide 10 mg daily.  We will get labs today.  Patient denies any hypoglycemic episodes, polyuria, polydipsia or polyphasia.  He is up-to-date on his foot and due for his  eye exam.  CKD: His last GFR was 53 on 02/15/2022.  He last saw nephrology on 02/20/2022. No changes were made on plan of care. He continues to take farxiga 10 mg daily, valsartan-hydrochlorothiazide 320-12.5 mg daily.   Obesity: His weight at last appointment was 258 pounds with a BMI of 32.36.   His weight today is 251  lbs with a BMI of 31.42.  Continues to workout regularly and tries to eat a healthy well-balanced diet.  Abilities include hypertension diabetes chronic kidney disease depression and chronic back pain.   Depression: Currently takes Cymbalta 60 mg daily doing well will continue on current dose    07/28/2022   10:20 AM 07/28/2022   10:09 AM 05/04/2022   10:28 AM 03/30/2022    8:33 AM 03/14/2022    8:09 AM  Depression screen PHQ 2/9  Decreased Interest 0 0 0 0 0  Down, Depressed, Hopeless 0 0 0 0 0  PHQ - 2 Score 0 0 0 0 0  Altered sleeping 0   0 0  Tired, decreased energy 0   0 0  Change in appetite 0   0 0  Feeling bad or failure about yourself  0   0 0  Trouble concentrating 0   0 0  Moving slowly or fidgety/restless 0   0 0  Suicidal thoughts 0   0 0  PHQ-9 Score 0   0 0  Difficult doing work/chores Not difficult at all   Not difficult at all Not difficult at all       05/04/2022   10:32 AM 02/02/2022   10:10 AM 11/02/2021  10:41 AM 05/09/2021    1:19 PM  GAD 7 : Generalized Anxiety Score  Nervous, Anxious, on Edge 0 0 0 0  Control/stop worrying 0 0 0 0  Worry too much - different things 0 0 0 0  Trouble relaxing 0 0 0 0  Restless 0 0 0 0  Easily annoyed or irritable 0 0 0 0  Afraid - awful might happen 0 0 0 0  Total GAD 7 Score 0 0 0 0  Anxiety Difficulty  Not difficult at all Not difficult at all Not difficult at all    Relevant past medical, surgical, family and social history reviewed and updated as indicated. Interim medical history since our last visit reviewed. Allergies and medications reviewed and updated.  Review of Systems  Constitutional: Negative for fever or weight change.  Respiratory: Negative for cough and shortness of breath.   Cardiovascular: Negative for chest pain or palpitations.  Gastrointestinal: Negative for abdominal pain, no bowel changes.  Musculoskeletal: Negative for gait problem or joint swelling.  Positive for back  pain Skin: Negative for rash.  Neurological: Negative for dizziness or headache.  No other specific complaints in a complete review of systems (except as listed in HPI above).      Objective:    BP 134/76   Pulse 94   Temp 97.7 F (36.5 C) (Oral)   Resp 18   Ht 6\' 3"  (1.905 m)   Wt 251 lb 6.4 oz (114 kg)   SpO2 98%   BMI 31.42 kg/m   Wt Readings from Last 3 Encounters:  07/28/22 251 lb 6.4 oz (114 kg)  05/04/22 258 lb 14.4 oz (117.4 kg)  03/30/22 254 lb 1.6 oz (115.3 kg)    Physical Exam  Constitutional: Patient appears well-developed and well-nourished. Obese  No distress.  HEENT: head atraumatic, normocephalic, pupils equal and reactive to light,  neck supple Cardiovascular: Normal rate, regular rhythm and normal heart sounds.  No murmur heard. No BLE edema. Pulmonary/Chest: Effort normal and breath sounds normal. No respiratory distress. Abdominal: Soft.  There is no tenderness. Psychiatric: Patient has a normal mood and affect. behavior is normal. Judgment and thought content normal.      Assessment & Plan:   Problem List Items Addressed This Visit       Cardiovascular and Mediastinum   Essential hypertension    Continue taking metoprolol 100 mg daily and valsartan-hydrochlorothiazide 320-12.5 mg daily.  Working on lifestyle modification.      Relevant Orders   CBC with Differential/Platelet   COMPLETE METABOLIC PANEL WITH GFR   Aortic atherosclerosis (HCC)    Continue taking atorvastatin 40 mg daily.      Relevant Orders   COMPLETE METABOLIC PANEL WITH GFR   Lipid panel     Endocrine   Type 2 diabetes mellitus without complication, without long-term current use of insulin (HCC)    Continue taking metformin 1000 mg twice daily glipizide 10 mg daily and Trulicity 3 mg once a week.  He is also taking Farxiga 10 mg daily.  He does need to schedule his eye exam.      Relevant Medications   metFORMIN (GLUCOPHAGE) 1000 MG tablet   glipiZIDE (GLUCOTROL) 10 MG  tablet   Other Relevant Orders   Hemoglobin A1c     Musculoskeletal and Integument   DDD (degenerative disc disease), lumbar - Primary    Patient is currently on hydrocodone for pain.  Will decrease his prescription from 30 tablets to 28 tablets  a month.  He is also on Cymbalta 60 mg daily, gabapentin 600 mg and tizanidine as needed.      Relevant Medications   HYDROcodone-acetaminophen (NORCO) 10-325 MG tablet   HYDROcodone-acetaminophen (NORCO) 10-325 MG tablet (Start on 08/26/2022)   HYDROcodone-acetaminophen (NORCO) 10-325 MG tablet (Start on 09/25/2022)     Genitourinary   CKD stage G3b/A2, GFR 30-44 and albumin creatinine ratio 30-299 mg/g [facility])    Patient last saw nephrology on 02/20/2022.  He is currently taking Farxiga 10 mg daily and valsartan-hydrochlorothiazide 320-12.5 mg daily.  He is working on keeping better diabetic control.        Other   Anxiety    Taking Cymbalta 60 mg daily.      Relevant Medications   DULoxetine (CYMBALTA) 60 MG capsule   Continuous opioid dependence (HCC)    Patient is currently on hydrocodone for pain.  Will decrease his prescription from 30 tablets to 28 tablets a month.  He is also on Cymbalta 60 mg daily, gabapentin 600 mg and tizanidine as needed.      Relevant Medications   HYDROcodone-acetaminophen (NORCO) 10-325 MG tablet   HYDROcodone-acetaminophen (NORCO) 10-325 MG tablet (Start on 08/26/2022)   HYDROcodone-acetaminophen (NORCO) 10-325 MG tablet (Start on 09/25/2022)   Morbid obesity (HCC)   Relevant Medications   metFORMIN (GLUCOPHAGE) 1000 MG tablet   glipiZIDE (GLUCOTROL) 10 MG tablet   Hyperlipidemia   Relevant Orders   COMPLETE METABOLIC PANEL WITH GFR   Lipid panel   Chronic low back pain    Patient is currently on hydrocodone for pain.  Will decrease his prescription from 30 tablets to 28 tablets a month.  He is also on Cymbalta 60 mg daily, gabapentin 600 mg and tizanidine as needed.      Relevant Medications    HYDROcodone-acetaminophen (NORCO) 10-325 MG tablet   HYDROcodone-acetaminophen (NORCO) 10-325 MG tablet (Start on 08/26/2022)   HYDROcodone-acetaminophen (NORCO) 10-325 MG tablet (Start on 09/25/2022)   DULoxetine (CYMBALTA) 60 MG capsule   Depression    Continue Cymbalta 60 mg daily.      Relevant Medications   DULoxetine (CYMBALTA) 60 MG capsule   Other Visit Diagnoses     Type 2 diabetes mellitus with obesity (HCC)       Relevant Medications   metFORMIN (GLUCOPHAGE) 1000 MG tablet   glipiZIDE (GLUCOTROL) 10 MG tablet   Major depressive disorder, recurrent episode, moderate (HCC)       Relevant Medications   DULoxetine (CYMBALTA) 60 MG capsule        Follow up plan: Return in about 3 months (around 10/27/2022) for follow up.

## 2022-07-28 ENCOUNTER — Encounter: Payer: Self-pay | Admitting: Nurse Practitioner

## 2022-07-28 ENCOUNTER — Ambulatory Visit (INDEPENDENT_AMBULATORY_CARE_PROVIDER_SITE_OTHER): Payer: Medicare Other | Admitting: Nurse Practitioner

## 2022-07-28 ENCOUNTER — Other Ambulatory Visit: Payer: Self-pay

## 2022-07-28 VITALS — BP 134/76 | HR 94 | Temp 97.7°F | Resp 18 | Ht 75.0 in | Wt 251.4 lb

## 2022-07-28 DIAGNOSIS — I700 Atherosclerosis of aorta: Secondary | ICD-10-CM | POA: Diagnosis not present

## 2022-07-28 DIAGNOSIS — M545 Low back pain, unspecified: Secondary | ICD-10-CM

## 2022-07-28 DIAGNOSIS — G8929 Other chronic pain: Secondary | ICD-10-CM

## 2022-07-28 DIAGNOSIS — F331 Major depressive disorder, recurrent, moderate: Secondary | ICD-10-CM

## 2022-07-28 DIAGNOSIS — N1832 Chronic kidney disease, stage 3b: Secondary | ICD-10-CM

## 2022-07-28 DIAGNOSIS — I10 Essential (primary) hypertension: Secondary | ICD-10-CM | POA: Diagnosis not present

## 2022-07-28 DIAGNOSIS — E669 Obesity, unspecified: Secondary | ICD-10-CM

## 2022-07-28 DIAGNOSIS — F419 Anxiety disorder, unspecified: Secondary | ICD-10-CM

## 2022-07-28 DIAGNOSIS — E7800 Pure hypercholesterolemia, unspecified: Secondary | ICD-10-CM

## 2022-07-28 DIAGNOSIS — E1169 Type 2 diabetes mellitus with other specified complication: Secondary | ICD-10-CM

## 2022-07-28 DIAGNOSIS — M5136 Other intervertebral disc degeneration, lumbar region: Secondary | ICD-10-CM | POA: Diagnosis not present

## 2022-07-28 DIAGNOSIS — F3341 Major depressive disorder, recurrent, in partial remission: Secondary | ICD-10-CM

## 2022-07-28 DIAGNOSIS — E119 Type 2 diabetes mellitus without complications: Secondary | ICD-10-CM | POA: Diagnosis not present

## 2022-07-28 DIAGNOSIS — F1120 Opioid dependence, uncomplicated: Secondary | ICD-10-CM

## 2022-07-28 MED ORDER — GLIPIZIDE 10 MG PO TABS
10.0000 mg | ORAL_TABLET | Freq: Every day | ORAL | 3 refills | Status: AC
Start: 2022-07-28 — End: ?

## 2022-07-28 MED ORDER — METFORMIN HCL 1000 MG PO TABS
ORAL_TABLET | ORAL | 3 refills | Status: DC
Start: 2022-07-28 — End: 2023-10-08

## 2022-07-28 MED ORDER — DULOXETINE HCL 60 MG PO CPEP
ORAL_CAPSULE | ORAL | 3 refills | Status: DC
Start: 2022-07-28 — End: 2023-07-06

## 2022-07-28 MED ORDER — HYDROCODONE-ACETAMINOPHEN 10-325 MG PO TABS
0.5000 | ORAL_TABLET | Freq: Three times a day (TID) | ORAL | 0 refills | Status: DC | PRN
Start: 2022-09-25 — End: 2022-10-25

## 2022-07-28 MED ORDER — HYDROCODONE-ACETAMINOPHEN 10-325 MG PO TABS
0.5000 | ORAL_TABLET | Freq: Three times a day (TID) | ORAL | 0 refills | Status: DC | PRN
Start: 2022-07-28 — End: 2022-10-25

## 2022-07-28 MED ORDER — HYDROCODONE-ACETAMINOPHEN 10-325 MG PO TABS
0.5000 | ORAL_TABLET | Freq: Three times a day (TID) | ORAL | 0 refills | Status: DC | PRN
Start: 2022-08-26 — End: 2022-10-25

## 2022-07-28 NOTE — Assessment & Plan Note (Signed)
Continue Cymbalta 60 mg daily. 

## 2022-07-28 NOTE — Assessment & Plan Note (Signed)
Patient is currently on hydrocodone for pain.  Will decrease his prescription from 30 tablets to 28 tablets a month.  He is also on Cymbalta 60 mg daily, gabapentin 600 mg and tizanidine as needed.

## 2022-07-28 NOTE — Assessment & Plan Note (Signed)
Patient is currently on hydrocodone for pain.  Will decrease his prescription from 30 tablets to 28 tablets a month.  He is also on Cymbalta 60 mg daily, gabapentin 600 mg and tizanidine as needed. 

## 2022-07-28 NOTE — Assessment & Plan Note (Signed)
Continue taking metoprolol 100 mg daily and valsartan-hydrochlorothiazide 320-12.5 mg daily.  Working on lifestyle modification.

## 2022-07-28 NOTE — Assessment & Plan Note (Addendum)
Continue taking metformin 1000 mg twice daily glipizide 10 mg daily and Trulicity 3 mg once a week.  He is also taking Farxiga 10 mg daily.  He does need to schedule his eye exam.

## 2022-07-28 NOTE — Assessment & Plan Note (Signed)
Patient last saw nephrology on 02/20/2022.  He is currently taking Farxiga 10 mg daily and valsartan-hydrochlorothiazide 320-12.5 mg daily.  He is working on keeping better diabetic control.

## 2022-07-28 NOTE — Assessment & Plan Note (Signed)
Taking Cymbalta 60 mg daily.

## 2022-07-28 NOTE — Assessment & Plan Note (Signed)
Continue taking atorvastatin 40 mg daily. 

## 2022-07-29 LAB — COMPLETE METABOLIC PANEL WITH GFR
AG Ratio: 1.6 (calc) (ref 1.0–2.5)
ALT: 20 U/L (ref 9–46)
AST: 14 U/L (ref 10–35)
Albumin: 4.1 g/dL (ref 3.6–5.1)
Alkaline phosphatase (APISO): 97 U/L (ref 35–144)
BUN/Creatinine Ratio: 19 (calc) (ref 6–22)
BUN: 28 mg/dL — ABNORMAL HIGH (ref 7–25)
CO2: 28 mmol/L (ref 20–32)
Calcium: 9.8 mg/dL (ref 8.6–10.3)
Chloride: 102 mmol/L (ref 98–110)
Creat: 1.46 mg/dL — ABNORMAL HIGH (ref 0.70–1.28)
Globulin: 2.6 g/dL (calc) (ref 1.9–3.7)
Glucose, Bld: 265 mg/dL — ABNORMAL HIGH (ref 65–99)
Potassium: 4.6 mmol/L (ref 3.5–5.3)
Sodium: 142 mmol/L (ref 135–146)
Total Bilirubin: 0.5 mg/dL (ref 0.2–1.2)
Total Protein: 6.7 g/dL (ref 6.1–8.1)
eGFR: 51 mL/min/{1.73_m2} — ABNORMAL LOW (ref >=60)

## 2022-07-29 LAB — CBC WITH DIFFERENTIAL/PLATELET
Absolute Monocytes: 428 cells/uL (ref 200–950)
Basophils Absolute: 61 cells/uL (ref 0–200)
Basophils Relative: 0.9 %
Eosinophils Absolute: 415 cells/uL (ref 15–500)
Eosinophils Relative: 6.1 %
HCT: 45.2 % (ref 38.5–50.0)
Hemoglobin: 15.1 g/dL (ref 13.2–17.1)
Lymphs Abs: 1550 cells/uL (ref 850–3900)
MCH: 32.1 pg (ref 27.0–33.0)
MCHC: 33.4 g/dL (ref 32.0–36.0)
MCV: 96.2 fL (ref 80.0–100.0)
MPV: 11.3 fL (ref 7.5–12.5)
Monocytes Relative: 6.3 %
Neutro Abs: 4345 cells/uL (ref 1500–7800)
Neutrophils Relative %: 63.9 %
Platelets: 224 10*3/uL (ref 140–400)
RBC: 4.7 10*6/uL (ref 4.20–5.80)
RDW: 13 % (ref 11.0–15.0)
Total Lymphocyte: 22.8 %
WBC: 6.8 10*3/uL (ref 3.8–10.8)

## 2022-07-29 LAB — LIPID PANEL
Cholesterol: 136 mg/dL (ref <200)
HDL: 37 mg/dL — ABNORMAL LOW (ref >=40)
LDL Cholesterol (Calc): 70 mg/dL (calc)
Non-HDL Cholesterol (Calc): 99 mg/dL (calc) (ref <130)
Total CHOL/HDL Ratio: 3.7 (calc) (ref <5.0)
Triglycerides: 232 mg/dL — ABNORMAL HIGH (ref <150)

## 2022-07-29 LAB — HEMOGLOBIN A1C
Hgb A1c MFr Bld: 9 % of total Hgb — ABNORMAL HIGH (ref <5.7)
Mean Plasma Glucose: 212 mg/dL
eAG (mmol/L): 11.7 mmol/L

## 2022-07-31 ENCOUNTER — Other Ambulatory Visit: Payer: Self-pay | Admitting: Nurse Practitioner

## 2022-07-31 DIAGNOSIS — E119 Type 2 diabetes mellitus without complications: Secondary | ICD-10-CM

## 2022-07-31 MED ORDER — TRULICITY 4.5 MG/0.5ML ~~LOC~~ SOAJ
4.5000 mg | SUBCUTANEOUS | 3 refills | Status: DC
Start: 2022-07-31 — End: 2022-08-03

## 2022-08-03 ENCOUNTER — Other Ambulatory Visit: Payer: Self-pay | Admitting: Nurse Practitioner

## 2022-08-03 ENCOUNTER — Encounter: Payer: Self-pay | Admitting: Nurse Practitioner

## 2022-08-03 DIAGNOSIS — E119 Type 2 diabetes mellitus without complications: Secondary | ICD-10-CM

## 2022-08-03 MED ORDER — TRULICITY 4.5 MG/0.5ML ~~LOC~~ SOAJ: 4.5000 mg | SUBCUTANEOUS | 3 refills | Status: DC

## 2022-08-03 NOTE — Telephone Encounter (Signed)
Evan Rivas send script to [facility]

## 2022-08-17 DIAGNOSIS — I10 Essential (primary) hypertension: Secondary | ICD-10-CM | POA: Diagnosis not present

## 2022-08-17 DIAGNOSIS — E1122 Type 2 diabetes mellitus with diabetic chronic kidney disease: Secondary | ICD-10-CM | POA: Diagnosis not present

## 2022-08-17 DIAGNOSIS — N2581 Secondary hyperparathyroidism of renal origin: Secondary | ICD-10-CM | POA: Diagnosis not present

## 2022-08-17 DIAGNOSIS — N1831 Chronic kidney disease, stage 3a: Secondary | ICD-10-CM | POA: Diagnosis not present

## 2022-08-21 DIAGNOSIS — E1122 Type 2 diabetes mellitus with diabetic chronic kidney disease: Secondary | ICD-10-CM | POA: Diagnosis not present

## 2022-08-21 DIAGNOSIS — N1831 Chronic kidney disease, stage 3a: Secondary | ICD-10-CM | POA: Diagnosis not present

## 2022-08-21 DIAGNOSIS — I10 Essential (primary) hypertension: Secondary | ICD-10-CM | POA: Diagnosis not present

## 2022-08-21 DIAGNOSIS — N200 Calculus of kidney: Secondary | ICD-10-CM | POA: Diagnosis not present

## 2022-08-23 ENCOUNTER — Ambulatory Visit (INDEPENDENT_AMBULATORY_CARE_PROVIDER_SITE_OTHER): Payer: Medicare Other | Admitting: Dermatology

## 2022-08-23 VITALS — BP 132/86 | HR 79

## 2022-08-23 DIAGNOSIS — L820 Inflamed seborrheic keratosis: Secondary | ICD-10-CM

## 2022-08-23 DIAGNOSIS — Z1283 Encounter for screening for malignant neoplasm of skin: Secondary | ICD-10-CM

## 2022-08-23 DIAGNOSIS — D1801 Hemangioma of skin and subcutaneous tissue: Secondary | ICD-10-CM

## 2022-08-23 DIAGNOSIS — Z85828 Personal history of other malignant neoplasm of skin: Secondary | ICD-10-CM

## 2022-08-23 DIAGNOSIS — L821 Other seborrheic keratosis: Secondary | ICD-10-CM

## 2022-08-23 DIAGNOSIS — L814 Other melanin hyperpigmentation: Secondary | ICD-10-CM

## 2022-08-23 DIAGNOSIS — L578 Other skin changes due to chronic exposure to nonionizing radiation: Secondary | ICD-10-CM

## 2022-08-23 DIAGNOSIS — W908XXA Exposure to other nonionizing radiation, initial encounter: Secondary | ICD-10-CM

## 2022-08-23 DIAGNOSIS — D229 Melanocytic nevi, unspecified: Secondary | ICD-10-CM

## 2022-08-23 DIAGNOSIS — X32XXXA Exposure to sunlight, initial encounter: Secondary | ICD-10-CM

## 2022-08-23 NOTE — Patient Instructions (Addendum)
Cryotherapy Aftercare  Wash gently with soap and water everyday.   Apply Vaseline and Band-Aid daily until healed.     Due to recent changes in healthcare laws, you may see results of your pathology and/or laboratory studies on MyChart before the doctors have had a chance to review them. We understand that in some cases there may be results that are confusing or concerning to you. Please understand that not all results are received at the same time and often the doctors may need to interpret multiple results in order to provide you with the best plan of care or course of treatment. Therefore, we ask that you please give us 2 business days to thoroughly review all your results before contacting the office for clarification. Should we see a critical lab result, you will be contacted sooner.   If You Need Anything After Your Visit  If you have any questions or concerns for your doctor, please call our main line at [phone] and press option 4 to reach your doctor's medical assistant. If no one answers, please leave a voicemail as directed and we will return your call as soon as possible. Messages left after 4 pm will be answered the following business day.   You may also send us a message via MyChart. We typically respond to MyChart messages within 1-2 business days.  For prescription refills, please ask your pharmacy to contact our office. Our fax number is [phone].  If you have an urgent issue when the clinic is closed that cannot wait until the next business day, you can page your doctor at the number below.    Please note that while we do our best to be available for urgent issues outside of office hours, we are not available 24/7.   If you have an urgent issue and are unable to reach us, you may choose to seek medical care at your doctor's office, retail clinic, urgent care center, or emergency room.  If you have a medical emergency, please immediately call 911 or go to the  emergency department.  Pager Numbers  - Dr. Kowalski: [phone]  - Dr. Moye: [phone]  - Dr. Stewart: [phone]  In the event of inclement weather, please call our main line at [phone] for an update on the status of any delays or closures.  Dermatology Medication Tips: Please keep the boxes that topical medications come in in order to help keep track of the instructions about where and how to use these. Pharmacies typically print the medication instructions only on the boxes and not directly on the medication tubes.   If your medication is too expensive, please contact our office at [phone] option 4 or send us a message through MyChart.   We are unable to tell what your co-pay for medications will be in advance as this is different depending on your insurance coverage. However, we may be able to find a substitute medication at lower cost or fill out paperwork to get insurance to cover a needed medication.   If a prior authorization is required to get your medication covered by your insurance company, please allow us 1-2 business days to complete this process.  Drug prices often vary depending on where the prescription is filled and some pharmacies may offer cheaper prices.  The website www.goodrx.com contains coupons for medications through different pharmacies. The prices here do not account for what the cost may be with help from insurance (it may be cheaper with your insurance), but the website can   give you the price if you did not use any insurance.  - You can print the associated coupon and take it with your prescription to the pharmacy.  - You may also stop by our office during regular business hours and pick up a GoodRx coupon card.  - If you need your prescription sent electronically to a different pharmacy, notify our office through [place] MyChart or by phone at [phone] option 4.     Si Usted Necesita Algo Despus de Su Visita  Tambin puede  enviarnos un mensaje a travs de MyChart. Por lo general respondemos a los mensajes de MyChart en el transcurso de 1 a 2 das hbiles.  Para renovar recetas, por favor pida a su farmacia que se ponga en contacto con nuestra oficina. Nuestro nmero de fax es el [phone].  Si tiene un asunto urgente cuando la clnica est cerrada y que no puede esperar hasta el siguiente da hbil, puede llamar/localizar a su doctor(a) al nmero que aparece a continuacin.   Por favor, tenga en cuenta que aunque hacemos todo lo posible para estar disponibles para asuntos urgentes fuera del horario de oficina, no estamos disponibles las 24 horas del da, los 7 das de la semana.   Si tiene un problema urgente y no puede comunicarse con nosotros, puede optar por buscar atencin mdica  en el consultorio de su doctor(a), en una clnica privada, en un centro de atencin urgente o en una sala de emergencias.  Si tiene una emergencia mdica, por favor llame inmediatamente al 911 o vaya a la sala de emergencias.  Nmeros de bper  - Dr. Kowalski: [phone]  - Dra. Moye: [phone]  - Dra. Stewart: [phone]  En caso de inclemencias del tiempo, por favor llame a nuestra lnea principal al [phone] para una actualizacin sobre el estado de cualquier retraso o cierre.  Consejos para la medicacin en dermatologa: Por favor, guarde las cajas en las que vienen los medicamentos de uso tpico para ayudarle a seguir las instrucciones sobre dnde y cmo usarlos. Las farmacias generalmente imprimen las instrucciones del medicamento slo en las cajas y no directamente en los tubos del medicamento.   Si su medicamento es muy caro, por favor, pngase en contacto con nuestra oficina llamando al [phone] y presione la opcin 4 o envenos un mensaje a travs de MyChart.   No podemos decirle cul ser su copago por los medicamentos por adelantado ya que esto es diferente dependiendo de la cobertura de su seguro.  Sin embargo, es posible que podamos encontrar un medicamento sustituto a menor costo o llenar un formulario para que el seguro cubra el medicamento que se considera necesario.   Si se requiere una autorizacin previa para que su compaa de seguros cubra su medicamento, por favor permtanos de 1 a 2 das hbiles para completar este proceso.  Los precios de los medicamentos varan con frecuencia dependiendo del lugar de dnde se surte la receta y alguna farmacias pueden ofrecer precios ms baratos.  El sitio web www.goodrx.com tiene cupones para medicamentos de diferentes farmacias. Los precios aqu no tienen en cuenta lo que podra costar con la ayuda del seguro (puede ser ms barato con su seguro), pero el sitio web puede darle el precio si no utiliz ningn seguro.  - Puede imprimir el cupn correspondiente y llevarlo con su receta a la farmacia.  - Tambin puede pasar por nuestra oficina durante el horario de atencin regular y recoger una tarjeta de cupones de GoodRx.  -   Si necesita que su receta se enve electrnicamente a una farmacia diferente, informe a nuestra oficina a travs de MyChart de [facility] o por telfono llamando al [phone] y presione la opcin 4.  

## 2022-08-23 NOTE — Progress Notes (Signed)
   Follow-Up Visit   Subjective  Evan Rivas is a 72 y.o. male who presents for the following: Skin Cancer Screening and Full Body Skin Exam, hx of BCC.  The patient presents for Total-Body Skin Exam (TBSE) for skin cancer screening and mole check. The patient has spots, moles and lesions to be evaluated, some may be new or changing and the patient has concerns that these could be cancer.  The following portions of the chart were reviewed this encounter and updated as appropriate: medications, allergies, medical history  Review of Systems:  No other skin or systemic complaints except as noted in HPI or Assessment and Plan.  Objective  Well appearing patient in no apparent distress; mood and affect are within normal limits.  A full examination was performed including scalp, head, eyes, ears, nose, lips, neck, chest, axillae, abdomen, back, buttocks, bilateral upper extremities, bilateral lower extremities, hands, feet, fingers, toes, fingernails, and toenails. All findings within normal limits unless otherwise noted below.   Relevant physical exam findings are noted in the Assessment and Plan.  left temple x 1, left cheek x 1 (2) Stuck-on, waxy, tan-brown papules --Discussed benign etiology and prognosis.    Assessment & Plan   LENTIGINES, SEBORRHEIC KERATOSES, HEMANGIOMAS - Benign normal skin lesions - Benign-appearing - Call for any changes  MELANOCYTIC NEVI - Tan-brown and/or pink-flesh-colored symmetric macules and papules - Benign appearing on exam today - Observation - Call clinic for new or changing moles - Recommend daily use of broad spectrum spf 30+ sunscreen to sun-exposed areas.   ACTINIC DAMAGE - Chronic condition, secondary to cumulative UV/sun exposure - diffuse scaly erythematous macules with underlying dyspigmentation - Recommend daily broad spectrum sunscreen SPF 30+ to sun-exposed areas, reapply every 2 hours as needed.  - Staying in the shade or  wearing long sleeves, sun glasses (UVA+UVB protection) and wide brim hats (4-inch brim around the entire circumference of the hat) are also recommended for sun protection.  - Call for new or changing lesions.  HISTORY OF BASAL CELL CARCINOMA OF THE SKIN - No evidence of recurrence today - Recommend regular full body skin exams - Recommend daily broad spectrum sunscreen SPF 30+ to sun-exposed areas, reapply every 2 hours as needed.  - Call if any new or changing lesions are noted between office visits   SKIN CANCER SCREENING PERFORMED TODAY.  Inflamed seborrheic keratosis (2) left temple x 1, left cheek x 1  Symptomatic, irritating, patient would like treated.   Destruction of lesion - left temple x 1, left cheek x 1 Complexity: simple   Destruction method: cryotherapy   Informed consent: discussed and consent obtained   Timeout:  patient name, date of birth, surgical site, and procedure verified Lesion destroyed using liquid nitrogen: Yes   Region frozen until ice ball extended beyond lesion: Yes   Outcome: patient tolerated procedure well with no complications   Post-procedure details: wound care instructions given     Return in about 1 year (around 08/23/2023) for TBSE, hx of BCC.  IAngelique Holm, CMA, am acting as scribe for Armida Sans, MD .   Documentation: I have reviewed the above documentation for accuracy and completeness, and I agree with the above.  Armida Sans, MD

## 2022-08-30 ENCOUNTER — Ambulatory Visit: Payer: Medicare Other | Admitting: Dermatology

## 2022-09-05 ENCOUNTER — Encounter: Payer: Self-pay | Admitting: Dermatology

## 2022-10-11 DIAGNOSIS — M7061 Trochanteric bursitis, right hip: Secondary | ICD-10-CM | POA: Diagnosis not present

## 2022-10-11 DIAGNOSIS — Z96641 Presence of right artificial hip joint: Secondary | ICD-10-CM | POA: Diagnosis not present

## 2022-10-24 NOTE — Progress Notes (Unsigned)
There were no vitals taken for this visit.   Subjective:    Patient ID: Evan Rivas, male    DOB: [birth year], 72 y.o.   MRN: [id number]  HPI: Evan Rivas is a 72 y.o. male  No chief complaint on file.  Back pain/DDD/continuous opioid dependence: Patient is currently taking gabapentin 600 mg 3 times a day, Cymbalta 60 mg daily, tizanidine as needed.  We have been working on tapering down off hydrocodone.  The plan is to taper around 5 to 10% each refill.   Patient does have chronic back pain and DDD.  PDMP was reviewed. Will decrease prescription to 28 pills a month was getting 30 a month.   Hyperlipidemia/Aortic atherosclerosis: His last LDL was 88 on 02/02/2022.  Triglycerides were 211.  He currently takes atorvastatin 40 mg daily.  Denies any myalgia.  Get labs today.  Hypertension: Currently takes metoprolol 100 mg daily and valsartan-hydrochlorothiazide 320-12.5 mg daily.  He goes to the gym regularly.  He denies any chest pain, headaches, shortness of breath or blurred vision. Blood pressure today is 134/76.   Diabetes: His last A1c was 8.7 on 02/02/2022.  Patient reports his blood sugar has been running around 156-188.  He has been taking [place] and he just started his Trulicity 3 mg dose.  He also takes metformin 1000 mg twice daily and glipizide 10 mg daily.  We will get labs today.  Patient denies any hypoglycemic episodes, polyuria, polydipsia or polyphasia.  He is up-to-date on his foot and due for his  eye exam.  CKD: His last GFR was 53 on 02/15/2022.  He last saw nephrology on 02/20/2022. No changes were made on plan of care. He continues to take farxiga 10 mg daily, valsartan-hydrochlorothiazide 320-12.5 mg daily.   Obesity: His weight at last appointment was 258 pounds with a BMI of 32.36.  His weight today is 251  lbs with a BMI of 31.42.  Continues to workout regularly and tries to eat a healthy well-balanced diet.  Abilities include hypertension diabetes  chronic kidney disease depression and chronic back pain.   Depression: Currently takes Cymbalta 60 mg daily doing well will continue on current dose    07/28/2022   10:20 AM 07/28/2022   10:09 AM 05/04/2022   10:28 AM 03/30/2022    8:33 AM 03/14/2022    8:09 AM  Depression screen PHQ 2/9  Decreased Interest 0 0 0 0 0  Down, Depressed, Hopeless 0 0 0 0 0  PHQ - 2 Score 0 0 0 0 0  Altered sleeping 0   0 0  Tired, decreased energy 0   0 0  Change in appetite 0   0 0  Feeling bad or failure about yourself  0   0 0  Trouble concentrating 0   0 0  Moving slowly or fidgety/restless 0   0 0  Suicidal thoughts 0   0 0  PHQ-9 Score 0   0 0  Difficult doing work/chores Not difficult at all   Not difficult at all Not difficult at all       05/04/2022   10:32 AM 02/02/2022   10:10 AM 11/02/2021   10:41 AM 05/09/2021    1:19 PM  GAD 7 : Generalized Anxiety Score  Nervous, Anxious, on Edge 0 0 0 0  Control/stop worrying 0 0 0 0  Worry too much - different things 0 0 0 0  Trouble relaxing 0 0 0 0  Restless  0 0 0 0  Easily annoyed or irritable 0 0 0 0  Afraid - awful might happen 0 0 0 0  Total GAD 7 Score 0 0 0 0  Anxiety Difficulty  Not difficult at all Not difficult at all Not difficult at all    Relevant past medical, surgical, family and social history reviewed and updated as indicated. Interim medical history since our last visit reviewed. Allergies and medications reviewed and updated.  Review of Systems  Constitutional: Negative for fever or weight change.  Respiratory: Negative for cough and shortness of breath.   Cardiovascular: Negative for chest pain or palpitations.  Gastrointestinal: Negative for abdominal pain, no bowel changes.  Musculoskeletal: Negative for gait problem or joint swelling.  Positive for back pain Skin: Negative for rash.  Neurological: Negative for dizziness or headache.  No other specific complaints in a complete review of systems (except as listed in HPI  above).      Objective:    There were no vitals taken for this visit.  Wt Readings from Last 3 Encounters:  07/28/22 251 lb 6.4 oz (114 kg)  05/04/22 258 lb 14.4 oz (117.4 kg)  03/30/22 254 lb 1.6 oz (115.3 kg)    Physical Exam  Constitutional: Patient appears well-developed and well-nourished. Obese  No distress.  HEENT: head atraumatic, normocephalic, pupils equal and reactive to light,  neck supple Cardiovascular: Normal rate, regular rhythm and normal heart sounds.  No murmur heard. No BLE edema. Pulmonary/Chest: Effort normal and breath sounds normal. No respiratory distress. Abdominal: Soft.  There is no tenderness. Psychiatric: Patient has a normal mood and affect. behavior is normal. Judgment and thought content normal.      Assessment & Plan:   Problem List Items Addressed This Visit   None     Follow up plan: No follow-ups on file.

## 2022-10-25 ENCOUNTER — Other Ambulatory Visit: Payer: Self-pay

## 2022-10-25 ENCOUNTER — Telehealth: Payer: Self-pay | Admitting: Nurse Practitioner

## 2022-10-25 ENCOUNTER — Ambulatory Visit: Payer: Medicare Other | Admitting: Nurse Practitioner

## 2022-10-25 ENCOUNTER — Encounter: Payer: Self-pay | Admitting: Nurse Practitioner

## 2022-10-25 VITALS — BP 128/74 | HR 93 | Temp 98.7°F | Resp 16 | Ht 75.0 in | Wt 246.1 lb

## 2022-10-25 DIAGNOSIS — I700 Atherosclerosis of aorta: Secondary | ICD-10-CM

## 2022-10-25 DIAGNOSIS — F3341 Major depressive disorder, recurrent, in partial remission: Secondary | ICD-10-CM

## 2022-10-25 DIAGNOSIS — F1120 Opioid dependence, uncomplicated: Secondary | ICD-10-CM

## 2022-10-25 DIAGNOSIS — E119 Type 2 diabetes mellitus without complications: Secondary | ICD-10-CM | POA: Diagnosis not present

## 2022-10-25 DIAGNOSIS — R5383 Other fatigue: Secondary | ICD-10-CM

## 2022-10-25 DIAGNOSIS — G8929 Other chronic pain: Secondary | ICD-10-CM

## 2022-10-25 DIAGNOSIS — M5136 Other intervertebral disc degeneration, lumbar region: Secondary | ICD-10-CM | POA: Diagnosis not present

## 2022-10-25 DIAGNOSIS — M545 Low back pain, unspecified: Secondary | ICD-10-CM

## 2022-10-25 DIAGNOSIS — N1832 Chronic kidney disease, stage 3b: Secondary | ICD-10-CM

## 2022-10-25 DIAGNOSIS — I10 Essential (primary) hypertension: Secondary | ICD-10-CM

## 2022-10-25 LAB — POCT GLYCOSYLATED HEMOGLOBIN (HGB A1C): Hemoglobin A1C: 8.3 % — AB (ref 4.0–5.6)

## 2022-10-25 MED ORDER — HYDROCODONE-ACETAMINOPHEN 10-325 MG PO TABS
0.5000 | ORAL_TABLET | Freq: Three times a day (TID) | ORAL | 0 refills | Status: DC | PRN
Start: 2022-11-24 — End: 2022-10-26

## 2022-10-25 MED ORDER — HYDROCODONE-ACETAMINOPHEN 10-325 MG PO TABS
0.5000 | ORAL_TABLET | Freq: Three times a day (TID) | ORAL | 0 refills | Status: DC | PRN
Start: 2022-12-24 — End: 2022-10-26

## 2022-10-25 MED ORDER — HYDROCODONE-ACETAMINOPHEN 10-325 MG PO TABS
0.5000 | ORAL_TABLET | Freq: Three times a day (TID) | ORAL | 0 refills | Status: DC | PRN
Start: 2022-10-25 — End: 2022-10-26

## 2022-10-25 NOTE — Assessment & Plan Note (Signed)
She is currently on hydrocodone for pain.  He is currently getting 28 tablets a month.  Patient is also on Cymbalta 60 mg daily, gabapentin 600 mg 3 times a day and tizanidine as needed.  The risk of this bursitis is acting up.  However the surgeon will not give him a steroid injection until his A1c is better controlled.

## 2022-10-25 NOTE — Assessment & Plan Note (Signed)
Continue working better controlled.

## 2022-10-25 NOTE — Telephone Encounter (Signed)
Medication Refill - Medication: HYDROcodone-acetaminophen (NORCO) 10-325 MG tablet   Stated needs medication transferred to pharmacy below along with refills.  Please advise.   Has the patient contacted their pharmacy? Yes.    (Agent: If yes, when and what did the pharmacy advise?)  Preferred Pharmacy (with phone number or street name):  [facility] [address] [facility] - [place], [place] - [address]  [address] [place] [address]  Phone: [phone] Fax: [phone]  Hours: Not open 24 hours   Has the patient been seen for an appointment in the last year OR does the patient have an upcoming appointment? Yes.    Agent: Please be advised that RX refills may take up to 3 business days. We ask that you follow-up with your pharmacy.

## 2022-10-25 NOTE — Assessment & Plan Note (Signed)
Had some  weight loss.  Continue working on lifestyle modification.

## 2022-10-25 NOTE — Assessment & Plan Note (Signed)
Continue taking metoprolol 100 mg daily and valsartan-hydrochlorothiazide 320-12.5 mg daily.

## 2022-10-25 NOTE — Assessment & Plan Note (Signed)
Stable continue Cymbalta 60 mg daily

## 2022-10-25 NOTE — Assessment & Plan Note (Signed)
Continue taking atorvastatin 40 mg daily. 

## 2022-10-25 NOTE — Assessment & Plan Note (Signed)
1C today is 8.3.  Patient is currently taking Trulicity 4.5 mg will start Toujeo 10 units daily.  Monitor blood sugars.

## 2022-10-26 ENCOUNTER — Other Ambulatory Visit: Payer: Self-pay | Admitting: Nurse Practitioner

## 2022-10-26 DIAGNOSIS — F1120 Opioid dependence, uncomplicated: Secondary | ICD-10-CM

## 2022-10-26 DIAGNOSIS — M545 Low back pain, unspecified: Secondary | ICD-10-CM

## 2022-10-26 DIAGNOSIS — M5136 Other intervertebral disc degeneration, lumbar region: Secondary | ICD-10-CM

## 2022-10-26 LAB — TSH: TSH: 3.05 mIU/L (ref 0.40–4.50)

## 2022-10-26 MED ORDER — HYDROCODONE-ACETAMINOPHEN 10-325 MG PO TABS
0.5000 | ORAL_TABLET | Freq: Three times a day (TID) | ORAL | 0 refills | Status: DC | PRN
Start: 2022-12-24 — End: 2023-01-25

## 2022-10-26 MED ORDER — HYDROCODONE-ACETAMINOPHEN 10-325 MG PO TABS
0.5000 | ORAL_TABLET | Freq: Three times a day (TID) | ORAL | 0 refills | Status: DC | PRN
Start: 2022-11-24 — End: 2023-01-25

## 2022-10-26 MED ORDER — HYDROCODONE-ACETAMINOPHEN 10-325 MG PO TABS
0.5000 | ORAL_TABLET | Freq: Three times a day (TID) | ORAL | 0 refills | Status: DC | PRN
Start: 2022-10-26 — End: 2023-01-25

## 2022-11-08 DIAGNOSIS — M4726 Other spondylosis with radiculopathy, lumbar region: Secondary | ICD-10-CM | POA: Diagnosis not present

## 2022-11-08 DIAGNOSIS — M25551 Pain in right hip: Secondary | ICD-10-CM | POA: Diagnosis not present

## 2022-11-15 DIAGNOSIS — M25551 Pain in right hip: Secondary | ICD-10-CM | POA: Diagnosis not present

## 2022-11-15 DIAGNOSIS — M4726 Other spondylosis with radiculopathy, lumbar region: Secondary | ICD-10-CM | POA: Diagnosis not present

## 2022-11-17 ENCOUNTER — Telehealth: Payer: Self-pay | Admitting: Nurse Practitioner

## 2022-11-17 DIAGNOSIS — M25551 Pain in right hip: Secondary | ICD-10-CM | POA: Diagnosis not present

## 2022-11-17 DIAGNOSIS — M4726 Other spondylosis with radiculopathy, lumbar region: Secondary | ICD-10-CM | POA: Diagnosis not present

## 2022-11-17 NOTE — Telephone Encounter (Signed)
Pt is calling in because he wants to know what Evan Rivas thinks about him using Ozempic to address his diabetic issues. Pt says Evan Rivas would need to get in touch with Trinna Post the pharmacist to find out if the maker [employer]ord[employer] has a program that will pay for it for pt. Pt wants to know can he come in the office to get a finger prick to find out his A1C or would he need an appointment? Pt is requesting a call back regarding this. Please follow up with pt.

## 2022-11-17 NOTE — Telephone Encounter (Signed)
Patient notified will call him back when we hear back from Pharmacist

## 2022-11-17 NOTE — Telephone Encounter (Signed)
Patient notified, cannot get A1c but every 3 months. Need A1c to be lower  before seeing surgeon. Woould you recommend him trying Ozempic

## 2022-11-20 ENCOUNTER — Encounter: Payer: Self-pay | Admitting: Pharmacist

## 2022-11-20 ENCOUNTER — Ambulatory Visit: Payer: Medicare Other

## 2022-11-20 ENCOUNTER — Telehealth: Payer: Self-pay | Admitting: Nurse Practitioner

## 2022-11-20 ENCOUNTER — Other Ambulatory Visit: Payer: Medicare Other | Admitting: Pharmacist

## 2022-11-20 ENCOUNTER — Other Ambulatory Visit: Payer: Self-pay | Admitting: Nurse Practitioner

## 2022-11-20 DIAGNOSIS — M4726 Other spondylosis with radiculopathy, lumbar region: Secondary | ICD-10-CM | POA: Diagnosis not present

## 2022-11-20 DIAGNOSIS — M25551 Pain in right hip: Secondary | ICD-10-CM | POA: Diagnosis not present

## 2022-11-20 DIAGNOSIS — E119 Type 2 diabetes mellitus without complications: Secondary | ICD-10-CM | POA: Diagnosis not present

## 2022-11-20 LAB — POCT GLYCOSYLATED HEMOGLOBIN (HGB A1C): Hemoglobin A1C: 8.2 % — AB (ref 4.0–5.6)

## 2022-11-20 NOTE — Patient Instructions (Signed)
Goals Addressed             This Visit's Progress    Pharmacy Goals       It was a pleasure speaking with you today!   Please watch the mail for an envelope from [employer] containing the patient assistance program application. Please complete this application and mail back to [facility] Pharmacy Technician Noreene Larsson [place] along with a copy of your Medicare Part D prescription card and a copy of your proof of income document OR you can bring these documents to the office to have them faxed back to Attention: [name] at Fax # [phone]   If you need to call Noreene Larsson, you can reach her at [phone]   In the future, if you need to reach out to patient assistance programs regarding refills or to find out the status of your application, you can do so by calling:   [employer] at [phone]     Thank you!   Estelle Grumbles, PharmD, [facility] [phone]

## 2022-11-20 NOTE — Progress Notes (Signed)
   11/20/2022  Patient ID: Evan Rivas, male   DOB: [birth year], 72 y.o.   MRN: [id number]  Receive message from PCP requesting outreach to patient for medication assistance with switching patient from Trulicity to Ozempic.  Outreach to patient by telephone today.   Patient interested in switch from Trulicity to Ozempic as recommended by PCP to aid with blood sugar control.  Meets financial criteria for Ozempic patient assistance program through [employer].   Unable to complete medication review or further discuss diabetes control/management today as patient reports that he is getting ready to leave for an appointment.  Plan  1) Will collaborate with provider, [facility] CPhT, and patient to pursue Ozempic patient assistance program through [employer]. Will send patient MyChart message with contact information as requested  2) Will plan to review medication and discuss diabetes management during upcoming telephone call with patient on 12/06/2022 at 10 am  Estelle Grumbles, PharmD, [facility] [phone]

## 2022-11-20 NOTE — Progress Notes (Signed)
Patient came into office for A1c. Stated he need to have another A1c before Orthopedic will do his procedure (cortisone injections). Patient was made aware that he just had an A1c and maybe responsible for bill. Patient verbalized he understood and will pay for A1c if insurance do not pay.

## 2022-11-20 NOTE — Telephone Encounter (Unsigned)
Copied from CRM [id number]. Topic: General - Other >> Nov 20, 2022  3:35 PM Dondra Prader A wrote: Reason for CRM: Pt surgeon PA Judeth Cornfield stated that she would be ok with giving him the Cortizone shot as long as his PCP agrees that it is ok. Please call pt back to discuss.

## 2022-11-21 NOTE — Telephone Encounter (Signed)
FYI

## 2022-11-21 NOTE — Telephone Encounter (Signed)
Patient notified

## 2022-11-21 NOTE — Telephone Encounter (Signed)
Evan Rivas notified ok for injection

## 2022-11-21 NOTE — Telephone Encounter (Signed)
Tried Merchant navy officer at [employer] [phone]. Could not reach

## 2022-11-22 DIAGNOSIS — M7061 Trochanteric bursitis, right hip: Secondary | ICD-10-CM | POA: Diagnosis not present

## 2022-11-22 DIAGNOSIS — Z96641 Presence of right artificial hip joint: Secondary | ICD-10-CM | POA: Diagnosis not present

## 2022-11-23 ENCOUNTER — Telehealth: Payer: Self-pay | Admitting: Pharmacy Technician

## 2022-11-23 DIAGNOSIS — M4726 Other spondylosis with radiculopathy, lumbar region: Secondary | ICD-10-CM | POA: Diagnosis not present

## 2022-11-23 DIAGNOSIS — M25551 Pain in right hip: Secondary | ICD-10-CM | POA: Diagnosis not present

## 2022-11-23 DIAGNOSIS — Z5986 Financial insecurity: Secondary | ICD-10-CM

## 2022-11-23 NOTE — Progress Notes (Signed)
[facility] Customer service manager [facility])                                            [facility] Quality Pharmacy Team    11/23/2022  MONTAGUE TYLUTKI 09-07-1950 [id number]                                      Medication Assistance Referral  Referral From:  [facility] PharmD Estelle Grumbles  Medication/Company: Franki Monte / Novo Nordisk Patient application portion:  Mining engineer portion: Faxed  to Della Goo, FNP Provider address/fax verified via: Office website   [name], CPhT [facility]  [facility] Office: [phone] Fax: [phone] Email: [email or web address]

## 2022-11-27 DIAGNOSIS — M25551 Pain in right hip: Secondary | ICD-10-CM | POA: Diagnosis not present

## 2022-11-27 DIAGNOSIS — M4726 Other spondylosis with radiculopathy, lumbar region: Secondary | ICD-10-CM | POA: Diagnosis not present

## 2022-12-06 ENCOUNTER — Other Ambulatory Visit: Payer: Medicare Other | Admitting: Pharmacist

## 2022-12-06 ENCOUNTER — Other Ambulatory Visit: Payer: Self-pay | Admitting: Pharmacist

## 2022-12-06 ENCOUNTER — Telehealth: Payer: Self-pay | Admitting: Pharmacist

## 2022-12-06 NOTE — Progress Notes (Signed)
   Outreach Note  12/06/2022 Name: JARVIS YOCHUM MRN: [id number] DOB: 1951/01/24  Referred by: Berniece Salines, FNP  Was unable to reach patient via telephone today and have left HIPAA compliant voicemail asking patient to return my call.  Follow Up Plan: Will collaborate with Care Guide to outreach to schedule follow up with me  Estelle Grumbles, PharmD, [facility] [phone]

## 2022-12-06 NOTE — Progress Notes (Signed)
   12/06/2022  Patient ID: Evan Rivas, male   DOB: 26-Sep-1950, 72 y.o.   MRN: [id number]  Receive call from patient returning my call.  Requests to reschedule appointment to review medications.  Confirms received [employer] patient assistance program application as mailed by [employer]. States he will work on completing this and then bring to office to have faxed back to [employer] along with copy of his proof of income document. Review fax information for CPhT.  Follow Up Plan: Clinical Pharmacist will follow up with patient by telephone on 01/10/2023 at 10:00 AM   Estelle Grumbles, PharmD, [facility] [phone]

## 2022-12-15 ENCOUNTER — Other Ambulatory Visit: Payer: Self-pay

## 2022-12-15 ENCOUNTER — Telehealth (INDEPENDENT_AMBULATORY_CARE_PROVIDER_SITE_OTHER): Payer: Medicare Other | Admitting: Nurse Practitioner

## 2022-12-15 ENCOUNTER — Ambulatory Visit: Payer: Self-pay | Admitting: *Deleted

## 2022-12-15 ENCOUNTER — Encounter: Payer: Self-pay | Admitting: Nurse Practitioner

## 2022-12-15 VITALS — Ht 75.0 in | Wt 246.0 lb

## 2022-12-15 DIAGNOSIS — U071 COVID-19: Secondary | ICD-10-CM

## 2022-12-15 MED ORDER — BENZONATATE 100 MG PO CAPS
200.0000 mg | ORAL_CAPSULE | Freq: Two times a day (BID) | ORAL | 0 refills | Status: DC | PRN
Start: 2022-12-15 — End: 2023-01-10

## 2022-12-15 MED ORDER — PROMETHAZINE-DM 6.25-15 MG/5ML PO SYRP
5.0000 mL | ORAL_SOLUTION | Freq: Four times a day (QID) | ORAL | 0 refills | Status: DC | PRN
Start: 2022-12-15 — End: 2023-01-10

## 2022-12-15 MED ORDER — NIRMATRELVIR/RITONAVIR (PAXLOVID) TABLET (RENAL DOSING)
2.0000 | ORAL_TABLET | Freq: Two times a day (BID) | ORAL | 0 refills | Status: AC
Start: 2022-12-15 — End: 2022-12-20

## 2022-12-15 NOTE — Progress Notes (Signed)
Name: Evan Rivas   MRN: [id number]    DOB: 10/01/1950   Date:12/15/2022       Progress Note  Subjective  Chief Complaint  Chief Complaint  Patient presents with   Covid Positive    Cough, congested for 2 days tested positive today    I connected with  [name]  on 12/15/22 at 10:40 AM EDT by a video enabled telemedicine application and verified that I am speaking with the correct person using two identifiers.  I discussed the limitations of evaluation and management by telemedicine and the availability of in person appointments. The patient expressed understanding and agreed to proceed with a virtual visit  Staff also discussed with the patient that there may be a patient responsible charge related to this service. Patient Location: home Provider Location: [facility] Additional Individuals present: alone  HPI  Covid positive: symptoms started 2 days ago, tested positive for covid -Fever: no -Cough: yes, not to bad -Shortness of breath: no -Wheezing: no -Chest congestion: yes -Nasal congestion: yes -Runny nose: yes -Post nasal drip: yes -Sore throat: yes -Sinus pressure: yes -Headache: yes -Face pain: no -Ear pain:  no -Ear pressure: no -body aches: yes -Relief with OTC cold/cough medications: not currently taking anything  Recommend taking zyrtec, flonase, mucinex, vitamin d, vitamin c, and zinc. Push fluids and get rest.    Discussed antiviral treatment,  side effects and administration. Last GFR was 51, will give renal dose.   Patient Active Problem List   Diagnosis Date Noted   Other insomnia 02/02/2022   Erectile dysfunction 02/02/2022   Osteoarthritis of right hip 01/07/2020   Hip pain, right 12/31/2019   Trochanteric bursitis of right hip 11/27/2019   History of total knee replacement, bilateral 11/24/2019   Spine pain, multilevel 10/24/2019   Controlled substance agreement signed 09/30/2019   Emphysematous pyelitis 09/04/2019   CKD stage G3b/A2,  GFR 30-44 and albumin creatinine ratio 30-299 mg/g (HCC) 04/09/2019   Diastolic dysfunction 06/24/2018   OSA (obstructive sleep apnea) 06/24/2018   Excessive daytime sleepiness 06/24/2018   Aortic atherosclerosis (HCC) 06/17/2018   Leg edema 12/18/2017   BPH without urinary obstruction 06/03/2015   Depression 03/05/2015   Hyperlipidemia 09/03/2014   Chronic low back pain 09/03/2014   Type 2 diabetes mellitus without complication, without long-term current use of insulin (HCC) 09/03/2014   Allergic rhinitis 07/04/2014   Anxiety 07/04/2014   Continuous opioid dependence (HCC) 07/04/2014   DDD (degenerative disc disease), lumbar 07/04/2014   Gout 07/04/2014   Essential hypertension 07/04/2014   Morbid obesity (HCC) 07/04/2014   Neuralgia neuritis, sciatic nerve 07/04/2014   H/O malignant neoplasm of skin 12/24/2013    Social History   Tobacco Use   Smoking status: Never   Smokeless tobacco: Never  Substance Use Topics   Alcohol use: No     Current Outpatient Medications:    aspirin EC 81 MG tablet, Take 81 mg by mouth daily. Swallow whole., Disp: , Rfl:    atorvastatin (LIPITOR) 40 MG tablet, TAKE 1 TABLET AT BEDTIME, Disp: 90 tablet, Rfl: 3   benzonatate (TESSALON) 100 MG capsule, Take 2 capsules (200 mg total) by mouth 2 (two) times daily as needed for cough., Disp: 20 capsule, Rfl: 0   clotrimazole (LOTRIMIN) 1 % cream, Apply 1 application topically 2 (two) times daily. Apply for 7-10 days., Disp: 28 g, Rfl: 1   dapagliflozin propanediol (FARXIGA) 10 MG TABS tablet, Take 1 tablet (10 mg total) by mouth  daily before breakfast. Patient receives via [place] Patient Assistance Program, Disp: 90 tablet, Rfl: 3   Dulaglutide (TRULICITY) 4.5 MG/0.5ML SOPN, Inject 4.5 mg as directed once a week., Disp: 2 mL, Rfl: 3   DULoxetine (CYMBALTA) 60 MG capsule, TAKE 1 CAPSULE DAILY (MUST ATTEND DECEMBER APPOINTMENT FOR FURTHER REFILLS), Disp: 90 capsule, Rfl: 3   fluticasone (FLONASE) 50  MCG/ACT nasal spray, USE 2 SPRAYS IN EACH NOSTRIL DAILY, Disp: 11 g, Rfl: 0   gabapentin (NEURONTIN) 600 MG tablet, Take 1 tablet (600 mg total) by mouth 3 (three) times daily., Disp: 270 tablet, Rfl: 3   glipiZIDE (GLUCOTROL) 10 MG tablet, Take 1 tablet (10 mg total) by mouth daily before breakfast., Disp: 90 tablet, Rfl: 3   HYDROcodone-acetaminophen (NORCO) 10-325 MG tablet, Take 0.5-1 tablets by mouth every 8 (eight) hours as needed for severe pain. Gradually reducing dose, Disp: 28 tablet, Rfl: 0   [START ON 12/24/2022] HYDROcodone-acetaminophen (NORCO) 10-325 MG tablet, Take 0.5-1 tablets by mouth every 8 (eight) hours as needed for severe pain. Gradually reducing dose, Disp: 28 tablet, Rfl: 0   HYDROcodone-acetaminophen (NORCO) 10-325 MG tablet, Take 0.5-1 tablets by mouth every 8 (eight) hours as needed for severe pain. Gradually reducing dose, Disp: 28 tablet, Rfl: 0   ketoconazole (NIZORAL) 2 % cream, Apply to the feet QHS., Disp: 60 g, Rfl: 6   melatonin 3 MG TABS tablet, Take 3 mg by mouth at bedtime., Disp: , Rfl:    metFORMIN (GLUCOPHAGE) 1000 MG tablet, TAKE 1 TABLET TWICE A DAY WITH A MEAL, Disp: 180 tablet, Rfl: 3   metoprolol succinate (TOPROL-XL) 100 MG 24 hr tablet, TAKE 1 TABLET DAILY WITH OR IMMEDIATELY FOLLOWING A MEAL, Disp: 90 tablet, Rfl: 3   Multiple Vitamin (MULTIVITAMIN) tablet, Take 1 tablet by mouth daily., Disp: , Rfl:    nirmatrelvir/ritonavir, renal dosing, (PAXLOVID) 10 x 150 MG & 10 x 100MG  TABS, Take 2 tablets by mouth 2 (two) times daily for 5 days. (Take nirmatrelvir 150 mg one tablet twice daily for 5 days and ritonavir 100 mg one tablet twice daily for 5 days) Patient GFR is 51, Disp: 20 tablet, Rfl: 0   promethazine-dextromethorphan (PROMETHAZINE-DM) 6.25-15 MG/5ML syrup, Take 5 mLs by mouth 4 (four) times daily as needed for cough., Disp: 118 mL, Rfl: 0   sildenafil (REVATIO) 20 MG tablet, Take 1 tablet (20 mg total) by mouth as needed. Take 1-5 tabs as needed  prior to intercourse, Disp: 30 tablet, Rfl: 11   terbinafine (LAMISIL) 250 MG tablet, Take 1 tablet (250 mg total) by mouth daily. For tinea pedis, Disp: 30 tablet, Rfl: 1   tiZANidine (ZANAFLEX) 4 MG tablet, TAKE 1 TABLET EVERY 6 HOURS AS NEEDED FOR MUSCLE SPASMS, Disp: 270 tablet, Rfl: 4   valsartan-hydrochlorothiazide (DIOVAN-HCT) 320-12.5 MG tablet, TAKE 1 TABLET DAILY, Disp: 90 tablet, Rfl: 3   glucose blood (TRUE METRIX BLOOD GLUCOSE TEST) test strip, Test  blood sugar twice a day (Patient not taking: Reported on 05/04/2022), Disp: 100 each, Rfl: 12  Allergies  Allergen Reactions   Bee Venom Other (See Comments)   Celebrex [Celecoxib]     GI issues   Tetanus Antitoxin Other (See Comments)   Tetanus Toxoids Other (See Comments)    Unkown   Tetanus-Diphtheria Toxoids Td     Unknown    I personally reviewed active problem list, medication list, allergies with the patient/caregiver today.  ROS  Ten systems reviewed and is negative except as mentioned in HPI  Objective  Virtual encounter, vitals not obtained.  Body mass index is 30.75 kg/m.  Nursing Note and Vital Signs reviewed.  Physical Exam  Awake, alert and oriented, speaking in complete sentences  No results found for this or any previous visit (from the past 72 hour(s)).  Assessment & Plan  1. COVID-19 Recommend taking zyrtec, flonase, mucinex, vitamin d, vitamin c, and zinc. Push fluids and get rest.    Discussed antiviral treatment,  side effects and administration. Last GFR was 51, will give renal dose. - benzonatate (TESSALON) 100 MG capsule; Take 2 capsules (200 mg total) by mouth 2 (two) times daily as needed for cough.  Dispense: 20 capsule; Refill: 0 - promethazine-dextromethorphan (PROMETHAZINE-DM) 6.25-15 MG/5ML syrup; Take 5 mLs by mouth 4 (four) times daily as needed for cough.  Dispense: 118 mL; Refill: 0 - nirmatrelvir/ritonavir, renal dosing, (PAXLOVID) 10 x 150 MG & 10 x 100MG  TABS; Take 2 tablets  by mouth 2 (two) times daily for 5 days. (Take nirmatrelvir 150 mg one tablet twice daily for 5 days and ritonavir 100 mg one tablet twice daily for 5 days) Patient GFR is 51  Dispense: 20 tablet; Refill: 0   -Red flags and when to present for emergency care or RTC including fever >101.61F, chest pain, shortness of breath, new/worsening/un-resolving symptoms,  reviewed with patient at time of visit. Follow up and care instructions discussed and provided in AVS. - I discussed the assessment and treatment plan with the patient. The patient was provided an opportunity to ask questions and all were answered. The patient agreed with the plan and demonstrated an understanding of the instructions.  I provided 15 minutes of non-face-to-face time during this encounter.  Berniece Salines, FNP

## 2022-12-15 NOTE — Telephone Encounter (Signed)
  Chief Complaint: covid positive and has already spoke to PCP no triage needed  Symptoms: na Frequency: na Pertinent Negatives: Patient denies na Disposition: [] ED /[] Urgent Care (no appt availability in office) / [] Appointment(In office/virtual)/ []  [place] [facility]/ [x] Home Care/ [] Refused Recommended Disposition /[] [place] [facility]/ []  Follow-up with PCP Additional Notes: na  Summary: med request/runny nose feels low from covid   Pt covid, tested positive for covid, today, runny nose, feels low he says, chest tightness yesterday, but not today. He is asking for paxlovid to be sent to pharmacy. [facility] [address] [facility] - [place], [place] - [address] Phone: [phone] Fax: [phone]           My Chart VV scheduled this am        Reason for Disposition  Caller has already spoken with the PCP and has no further questions.  Answer Assessment - Initial Assessment Questions N/A Patient already had My chart VV with PCP today . No triage needed.  Protocols used: No Contact or Duplicate Contact Call-A-AH

## 2022-12-28 ENCOUNTER — Telehealth: Payer: Self-pay | Admitting: Pharmacy Technician

## 2022-12-28 DIAGNOSIS — Z5986 Financial insecurity: Secondary | ICD-10-CM

## 2022-12-28 NOTE — Progress Notes (Signed)
[facility])                                            [facility] Quality Pharmacy Team    12/28/2022  Evan Rivas Jan 06, 1951 [id number]  Received both patient and provider portion(s) of patient assistance application(s) for Ozempic. Faxed completed application and required documents into [employer].    [name], CPhT [place]  Office: [phone] Fax: [phone] Email: [email or web address]

## 2022-12-29 ENCOUNTER — Other Ambulatory Visit: Payer: Self-pay

## 2022-12-29 ENCOUNTER — Other Ambulatory Visit: Payer: Medicare Other

## 2022-12-29 DIAGNOSIS — N138 Other obstructive and reflux uropathy: Secondary | ICD-10-CM

## 2022-12-29 DIAGNOSIS — N401 Enlarged prostate with lower urinary tract symptoms: Secondary | ICD-10-CM | POA: Diagnosis not present

## 2022-12-29 NOTE — Addendum Note (Signed)
Addended by: Consuella Lose on: 12/29/2022 09:33 AM   Modules accepted: Orders

## 2022-12-30 LAB — PSA: Prostate Specific Ag, Serum: 0.6 ng/mL (ref 0.0–4.0)

## 2023-01-02 ENCOUNTER — Ambulatory Visit: Payer: Medicare Other | Admitting: Urology

## 2023-01-02 ENCOUNTER — Telehealth: Payer: Self-pay | Admitting: Pharmacy Technician

## 2023-01-02 VITALS — BP 105/71 | HR 58 | Ht 75.0 in | Wt 240.0 lb

## 2023-01-02 DIAGNOSIS — Z5986 Financial insecurity: Secondary | ICD-10-CM

## 2023-01-02 DIAGNOSIS — Z87438 Personal history of other diseases of male genital organs: Secondary | ICD-10-CM

## 2023-01-02 DIAGNOSIS — N429 Disorder of prostate, unspecified: Secondary | ICD-10-CM | POA: Diagnosis not present

## 2023-01-02 DIAGNOSIS — N138 Other obstructive and reflux uropathy: Secondary | ICD-10-CM

## 2023-01-02 LAB — BLADDER SCAN AMB NON-IMAGING: Scan Result: 55

## 2023-01-02 NOTE — Progress Notes (Signed)
[facility] Customer service manager [facility])                                            [facility] Quality Pharmacy Team    01/02/2023  JEDI CATALFAMO 31-Aug-1950 [id number]  Care coordination call placed to [employer] In regard to Ozempic application.  Spoke to Darryl who informs patient is APPROVED 12/29/22-04/03/23 Initial medication and subsequent refills if applicable will be processed automatically and deliver to the prescriber's office. Patient may call [employer] at [phone] if he feels current supply is not sufficient until next supply arrives.  Unsuccessful outreach to patient. HIPAA compliant v/m left requesting a call back. Was calling to update patient on the information above.  [name], CPhT [place]  Office: [phone] Fax: [phone] Email: [email or web address]

## 2023-01-02 NOTE — Progress Notes (Signed)
I,Amy L Pierron,acting as a scribe for Vanna Scotland, MD.,have documented all relevant documentation on the behalf of Vanna Scotland, MD,as directed by  Vanna Scotland, MD while in the presence of Vanna Scotland, MD.  01/02/2023 9:54 AM   Evan Rivas 05/16/1950 [id number]  Referring provider: Berniece Salines, FNP [address] [place],  [place] [address]  Chief Complaint  Patient presents with   Benign Prostatic Hypertrophy    HPI: 72 year-old male with a personal history of BPH and erectile dysfunction returns today for routine follow-up.  He is status post UroLift and has been doing extremely well in terms of urinary symptoms. He has been able to stop all of the BPH medications.   For his erectile dysfunction he uses Sildenafil as needed.   His most recent PSA from 12/29/2022 is low and stable at 0.6.  He reports he has not tried the Sildenafil yet. His wife is concerned with it affecting his heart problems. He will discuss it with his cardiologist also.   He does report having some post voiding dripping which he has learned to live with.   Results for orders placed or performed in visit on 01/02/23  Bladder Scan (Post Void Residual) in office  Result Value Ref Range   Scan Result 55 ml     IPSS     Row Name 01/02/23 0900         International Prostate Symptom Score   How often have you had the sensation of not emptying your bladder? Less than 1 in 5     How often have you had to urinate less than every two hours? Not at All     How often have you found you stopped and started again several times when you urinated? Less than 1 in 5 times     How often have you found it difficult to postpone urination? Not at All     How often have you had a weak urinary stream? Less than 1 in 5 times     How often have you had to strain to start urination? Not at All     How many times did you typically get up at night to urinate? 1 Time     Total IPSS Score 4        Quality of Life due to urinary symptoms   If you were to spend the rest of your life with your urinary condition just the way it is now how would you feel about that? Mixed            Score:  1-7 Mild 8-19 Moderate 20-35 Severe   PMH: Past Medical History:  Diagnosis Date   Adrenal hyperplasia (HCC)    Anxiety    panic attacks   Arthritis    Basal cell carcinoma 2015   R upper lip - tx with MOHS by Dr. Adriana Simas   Basal cell carcinoma 08/25/2021   BCC, nasal tip, Mohs completed on 09/30/21   CKD (chronic kidney disease), stage III (HCC)    Depression    GERD (gastroesophageal reflux disease)    Gout    Grade II diastolic dysfunction    a.) TTE 02/19/2018 --> LVEF 60-65%, mild LVH, G2DD, mild MR, mildly dilated LA, RV cavity size/wall thickness/systolic function normal   History of kidney stones    Hyperaldosteronism (HCC)    a.) secondary to BILATERAL adrenal hyperplasia   Hyperlipidemia    Hypertension    Nerve entrapment  syndrome of lower extremity, right 10/24/2019   Pneumonia    2014 and 2018 ( 2018-klebsiella pneumonia    Sleep apnea    T2DM (type 2 diabetes mellitus) (HCC)     Surgical History: Past Surgical History:  Procedure Laterality Date   ARTHROSCOPIC REPAIR ACL Left    CIRCUMCISION N/A 01/17/2021   Procedure: CIRCUMCISION ADULT;  Surgeon: Vanna Scotland, MD;  Location: [facility];  Service: Urology;  Laterality: N/A;   CYSTOSCOPY W/ URETERAL STENT PLACEMENT Right 09/07/2014   Procedure: CYSTOSCOPY WITH STENT REPLACEMENT;  Surgeon: Vanna Scotland, MD;  Location: [facility];  Service: Urology;  Laterality: Right;   CYSTOSCOPY WITH INSERTION OF UROLIFT N/A 11/24/2019   Procedure: CYSTOSCOPY WITH INSERTION OF UROLIFT;  Surgeon: Vanna Scotland, MD;  Location: [facility];  Service: Urology;  Laterality: N/A;   CYSTOSCOPY/URETEROSCOPY/HOLMIUM LASER/STENT PLACEMENT Right 08/17/2014   Procedure: CYSTOSCOPY/URETEROSCOPY//STENT PLACEMENT/ attempt of lithotripsy;   Surgeon: Vanna Scotland, MD;  Location: [facility];  Service: Urology;  Laterality: Right;   HERNIA REPAIR     JOINT REPLACEMENT     Bilat knees and right hip   MOHS SURGERY     lip   TOTAL HIP ARTHROPLASTY Right 06/16/2020   Procedure: TOTAL HIP ARTHROPLASTY ANTERIOR APPROACH;  Surgeon: Ollen Gross, MD;  Location: [facility];  Service: Orthopedics;  Laterality: Right;    TOTAL KNEE ARTHROPLASTY Left 04/24/2016   Procedure: LEFT TOTAL KNEE ARTHROPLASTY;  Surgeon: Ollen Gross, MD;  Location: [facility];  Service: Orthopedics;  Laterality: Left;  Adductor Block   TOTAL KNEE ARTHROPLASTY Right 10/23/2016   Procedure: RIGHT TOTAL KNEE ARTHROPLASTY;  Surgeon: Ollen Gross, MD;  Location: [facility];  Service: Orthopedics;  Laterality: Right;   URETEROSCOPY WITH HOLMIUM LASER LITHOTRIPSY Right 09/07/2014   Procedure: URETEROSCOPY WITH HOLMIUM LASER LITHOTRIPSY;  Surgeon: Vanna Scotland, MD;  Location: [facility];  Service: Urology;  Laterality: Right;   VASECTOMY      Home Medications:  Allergies as of 01/02/2023       Reactions   Bee Venom Other (See Comments)   Celebrex [celecoxib]    GI issues   Tetanus Antitoxin Other (See Comments)   Tetanus Toxoids Other (See Comments)   Unkown   Tetanus-diphtheria Toxoids Td    Unknown        Medication List        Accurate as of January 02, 2023  9:54 AM. If you have any questions, ask your nurse or doctor.          STOP taking these medications    terbinafine 250 MG tablet Commonly known as: LamISIL       TAKE these medications    aspirin EC 81 MG tablet Take 81 mg by mouth daily. Swallow whole.   atorvastatin 40 MG tablet Commonly known as: LIPITOR TAKE 1 TABLET AT BEDTIME   benzonatate 100 MG capsule Commonly known as: TESSALON Take 2 capsules (200 mg total) by mouth 2 (two) times daily as needed for cough.   clotrimazole 1 % cream Commonly known as: LOTRIMIN Apply 1 application topically 2 (two) times daily. Apply  for 7-10 days.   dapagliflozin propanediol 10 MG Tabs tablet Commonly known as: Farxiga Take 1 tablet (10 mg total) by mouth daily before breakfast. Patient receives via [place] Patient Assistance [employer]   DULoxetine 60 MG capsule Commonly known as: CYMBALTA TAKE 1 CAPSULE DAILY (MUST ATTEND DECEMBER APPOINTMENT FOR FURTHER REFILLS)   fluticasone 50 MCG/ACT nasal spray Commonly known as: FLONASE USE 2  SPRAYS IN EACH NOSTRIL DAILY   gabapentin 600 MG tablet Commonly known as: NEURONTIN Take 1 tablet (600 mg total) by mouth 3 (three) times daily.   glipiZIDE 10 MG tablet Commonly known as: GLUCOTROL Take 1 tablet (10 mg total) by mouth daily before breakfast.   HYDROcodone-acetaminophen 10-325 MG tablet Commonly known as: NORCO Take 0.5-1 tablets by mouth every 8 (eight) hours as needed for severe pain. Gradually reducing dose   HYDROcodone-acetaminophen 10-325 MG tablet Commonly known as: NORCO Take 0.5-1 tablets by mouth every 8 (eight) hours as needed for severe pain. Gradually reducing dose   HYDROcodone-acetaminophen 10-325 MG tablet Commonly known as: NORCO Take 0.5-1 tablets by mouth every 8 (eight) hours as needed for severe pain. Gradually reducing dose   ketoconazole 2 % cream Commonly known as: NIZORAL Apply to the feet QHS.   melatonin 3 MG Tabs tablet Take 3 mg by mouth at bedtime.   metFORMIN 1000 MG tablet Commonly known as: GLUCOPHAGE TAKE 1 TABLET TWICE A DAY WITH A MEAL   metoprolol succinate 100 MG 24 hr tablet Commonly known as: TOPROL-XL TAKE 1 TABLET DAILY WITH OR IMMEDIATELY FOLLOWING A MEAL   multivitamin tablet Take 1 tablet by mouth daily.   promethazine-dextromethorphan 6.25-15 MG/5ML syrup Commonly known as: PROMETHAZINE-DM Take 5 mLs by mouth 4 (four) times daily as needed for cough.   sildenafil 20 MG tablet Commonly known as: Revatio Take 1 tablet (20 mg total) by mouth as needed. Take 1-5 tabs as needed prior to intercourse    tiZANidine 4 MG tablet Commonly known as: ZANAFLEX TAKE 1 TABLET EVERY 6 HOURS AS NEEDED FOR MUSCLE SPASMS   True Metrix Blood Glucose Test test strip Generic drug: glucose blood Test  blood sugar twice a day   Trulicity 4.5 MG/0.5ML Sopn Generic drug: Dulaglutide Inject 4.5 mg as directed once a week.   valsartan-hydrochlorothiazide 320-12.5 MG tablet Commonly known as: DIOVAN-HCT TAKE 1 TABLET DAILY        Allergies:  Allergies  Allergen Reactions   Bee Venom Other (See Comments)   Celebrex [Celecoxib]     GI issues   Tetanus Antitoxin Other (See Comments)   Tetanus Toxoids Other (See Comments)    Unkown   Tetanus-Diphtheria Toxoids Td     Unknown    Family History: Family History  Problem Relation Age of Onset   Hypertension Mother    Cancer Mother    Hyperlipidemia Mother    Arthritis Mother    Stroke Father    Heart disease Father    Obesity Father    Depression Sister    Hypertension Sister    Hyperlipidemia Sister    Learning disabilities Sister    Obesity Sister    Kidney disease Maternal Grandmother     Social History:  reports that he has never smoked. He has never used smokeless tobacco. He reports that he does not drink alcohol and does not use drugs.   Physical Exam: BP 105/71   Pulse (!) 58   Ht 6\' 3"  (1.905 m)   Wt 240 lb (108.9 kg)   BMI 30.00 kg/m   Constitutional:  Alert and oriented, No acute distress. HEENT: [place] AT, moist mucus membranes.  Trachea midline, no masses. Neurologic: Grossly intact, no focal deficits, moving all 4 extremities. Psychiatric: Normal mood and affect.   Assessment & Plan:    1. BPH  - S/p UroLift. Symptoms stable.   - No need to return annually. Reviewed symptoms that may occur  which would prompt him to call for a visit. -PSA low/ stable  2. Erectile Dysfunction  -Has not used sildenafil yet; worried about cardiac contraindications.  Seeing his cardiologist in fact later today.  Do not appreciate  any contraindications but certainly would recommend any discussed pacifically with his cardiologist if he has some concerns.  Return if symptoms worsen or fail to improve.  I have reviewed the above documentation for accuracy and completeness, and I agree with the above.   Vanna Scotland, MD   [facility] [address] [place], [place] [address] [phone]

## 2023-01-03 ENCOUNTER — Encounter: Payer: Self-pay | Admitting: Internal Medicine

## 2023-01-03 ENCOUNTER — Ambulatory Visit: Payer: Medicare Other | Attending: Internal Medicine | Admitting: Internal Medicine

## 2023-01-03 VITALS — BP 94/68 | HR 83 | Ht 75.0 in | Wt 241.4 lb

## 2023-01-03 DIAGNOSIS — E269 Hyperaldosteronism, unspecified: Secondary | ICD-10-CM | POA: Insufficient documentation

## 2023-01-03 DIAGNOSIS — I700 Atherosclerosis of aorta: Secondary | ICD-10-CM

## 2023-01-03 DIAGNOSIS — R55 Syncope and collapse: Secondary | ICD-10-CM | POA: Diagnosis not present

## 2023-01-03 DIAGNOSIS — R0609 Other forms of dyspnea: Secondary | ICD-10-CM | POA: Insufficient documentation

## 2023-01-03 DIAGNOSIS — Z79899 Other long term (current) drug therapy: Secondary | ICD-10-CM

## 2023-01-03 DIAGNOSIS — N1831 Chronic kidney disease, stage 3a: Secondary | ICD-10-CM

## 2023-01-03 DIAGNOSIS — I152 Hypertension secondary to endocrine disorders: Secondary | ICD-10-CM

## 2023-01-03 DIAGNOSIS — E785 Hyperlipidemia, unspecified: Secondary | ICD-10-CM

## 2023-01-03 DIAGNOSIS — E1169 Type 2 diabetes mellitus with other specified complication: Secondary | ICD-10-CM

## 2023-01-03 MED ORDER — VALSARTAN-HYDROCHLOROTHIAZIDE 320-12.5 MG PO TABS: 0.5000 | ORAL_TABLET | Freq: Every day | ORAL | 3 refills | Status: DC

## 2023-01-03 NOTE — Addendum Note (Signed)
Addended by: Parke Poisson on: 01/03/2023 03:31 PM   Modules accepted: Orders

## 2023-01-03 NOTE — Progress Notes (Signed)
Cardiology Office Note:  .   Date:  01/03/2023  ID:  Evan Rivas, DOB [birth year], MRN [id number] PCP: Berniece Salines, FNP  [facility] Providers Cardiologist:  Yvonne Kendall, MD     History of Present Illness: .   Discussed the use of AI scribe software for clinical note transcription with the patient, who gave verbal consent to proceed.  Evan Rivas is a 72 y.o. male with history of diastolic dysfunction, hyperaldosteronism secondary to adrenal hyperplasia, hypertension, hyperlipidemia, type 2 diabetes mellitus, cancer of the lip, anxiety, depression, gout, and arthritis with chronic pain, who presents for follow-up of hypertension.  He was last seen in our office in 09/2021 by Charlsie Quest, NP, following a recent ED visit for near syncope.  His near syncopal episode occurred in the setting of having been out of town and not having had much to eat or drink.  ED evaluation was unremarkable.  He was feeling well at his follow-up visit other than pain in his hips in the legs that he attributed to sciatica.  No medication changes or additional testing were pursued.  Today, Evan Rivas presents with a year-long history of progressive fatigue and weakness, particularly with exertion. This has significantly impacted his daily activities, leading to cessation of golf, a previously enjoyed pastime. He describes episodes of lightheadedness and two instances of syncope over the past year. The first episode occurred while seated, leading to a brief loss of consciousness and subsequent hospital admission in [place] where extensive workup, including a brain scan, was reportedly unremarkable. The second episode occurred during a golf game, after which he noted a significant increase in his blood glucose levels.  Evan Rivas also reports occasional dyspnea but denies chest pain or palpitations. He has noticed swelling in his feet, which was self-managed with spironolactone (his nephrologist  had previously cautioned against continued use of this medication). Evan Rivas has been monitoring his blood pressure at home, which has been trending on the lower side recently. No recent changes in medication have been made. Evan Rivas has made dietary changes, cutting out chips and sweets, which has resulted in significant weight loss. He is unsure if this could be contributing to his symptoms.     ROS: See HPI  Studies Reviewed: [name]   EKG Interpretation Date/Time:  Wednesday January 03 2023 11:07:49 EDT Ventricular Rate:  83 PR Interval:  238 QRS Duration:  64 QT Interval:  366 QTC Calculation: 430 R Axis:   8  Text Interpretation: Sinus rhythm with 1st degree A-V block Low voltage QRS Possible Inferior infarct , age undetermined Cannot rule out Anterior infarct , age undetermined versus lead placement When compared with ECG of 15-Sep-2021 PR interval has increased Minimal criteria for Anterior infarct are now Present Possible Inferior infarct is now Present Confirmed by Evan Rivas, Cristal Deer [phone]) on 01/03/2023 11:18:09 AM    Risk Assessment/Calculations:             Physical Exam:   VS:  BP 94/68 (BP Location: Left Arm, Patient Position: Sitting, Cuff Size: Large)   Pulse 83   Ht 6\' 3"  (1.905 m)   Wt 241 lb 6.4 oz (109.5 kg)   SpO2 95%   BMI 30.17 kg/m    Wt Readings from Last 3 Encounters:  01/03/23 241 lb 6.4 oz (109.5 kg)  01/02/23 240 lb (108.9 kg)  12/15/22 246 lb (111.6 kg)    General:  NAD. Neck: No JVD or HJR. Lungs: Clear to auscultation bilaterally  without wheezes or crackles. Heart: Regular rate and rhythm without murmurs, rubs, or gallops. Abdomen: Soft, nontender, nondistended. Extremities: No lower extremity edema.  ASSESSMENT AND PLAN: .    Dyspnea on exertion and syncope: Evan Rivas reports progressive fatigue and exertional dyspnea over the last 6 to 12 months.  He has also had 2 syncopal episodes in the last year.  Both were very brief, only lasting  a second or two.  Extensive workup in [place] after the first episode was reportedly unremarkable.  I worry that underlying cardiovascular disease could be contributing to this and that his dyspnea and fatigue are anginal equivalents.  We have agreed to repeat an echocardiogram to look for any structural heart abnormalities.  We will also pursue a myocardial PET/CT to evaluate for underlying CAD, given his multiple risk factors and EKG changes today showing possible anterior and inferior infarcts.  Coronary CTA is not ideal given his chronic kidney disease.  I think myocardial PET/CT would be preferable to SPECT/CT of the patient's difficulty exercising, abnormal EKG, and diabetes mellitus.  I will defer medication changes in the meantime.  Will check a CBC, CMP, and TSH today.  If echo, stress test, and labs are unrevealing, we may need to consider event monitoring or ILR implantation for further workup of syncope.  Hypertension and hyperaldosteronism: Blood pressure borderline low today with fatigue noted at home.  I have asked Evan Rivas to cut his current valsartan 320/12.5 mg tablet in half (take 1/2 tablet daily).  Continue current dose of metoprolol.  Patient not on spironolactone due to concerns with CKD.  Hyperlipidemia and type 2 diabetes mellitus: Continue atorvastatin 40 mg daily summary: Most recent LDL in 07/2022 was reasonable at 70.  Ongoing management of DM per PCP.  Aortic atherosclerosis: Continue aggressive medical therapy to prevent progression of disease.  Chronic kidney disease stage 3a: Avoid nephrotoxic agents.  Continue follow-up with nephrology.    Informed Consent   Shared Decision Making/Informed Consent The risks [chest pain, shortness of breath, cardiac arrhythmias, dizziness, blood pressure fluctuations, myocardial infarction, stroke/transient ischemic attack, nausea, vomiting, allergic reaction, radiation exposure, metallic taste sensation and life-threatening  complications (estimated to be 1 in 10,000)], benefits (risk stratification, diagnosing coronary artery disease, treatment guidance) and alternatives of a cardiac PET stress test were discussed in detail with Mr. Filion and he agrees to proceed.     Dispo: Return to clinic in 1 month.  Signed, Yvonne Kendall, MD

## 2023-01-03 NOTE — Patient Instructions (Signed)
Medication Instructions:  Your physician recommends the following medication changes.  DECREASE: Valsartan-Hydrochlorothiazide 320-12.5 mg to 1/2 tablet my mouth daily   *If you need a refill on your cardiac medications before your next appointment, please call your pharmacy*   Lab Work: Your provider would like for you to have following labs drawn today (CBC, CMP, TSH).     Testing/Procedures: Your physician has requested that you have an echocardiogram. Echocardiography is a painless test that uses sound waves to create images of your heart. It provides your doctor with information about the size and shape of your heart and how well your heart's chambers and valves are working.   You may receive an ultrasound enhancing agent through an IV if needed to better visualize your heart during the echo. This procedure takes approximately one hour.  There are no restrictions for this procedure.  This will take place at [address] [address], [place] [address]   How to Prepare for Your Cardiac PET/CT Stress Test:  1. Please do not take these medications before your test:   Medications that may interfere with the cardiac pharmacological stress agent (ex. nitrates - including erectile dysfunction medications, isosorbide mononitrate, tamulosin or beta-blockers) the day of the exam. (Erectile dysfunction medication should be held for at least 72 hrs prior to test)  Hold Metoprolol morning of test Theophylline containing medications for 12 hours. Dipyridamole 48 hours prior to the test. Your remaining medications may be taken with water.  2. Nothing to eat or drink, except water, 3 hours prior to arrival time.   NO caffeine/decaffeinated products, or chocolate 12 hours prior to arrival.  3. NO perfume, cologne or lotion on chest or abdomen area.          - FEMALES - Please avoid wearing dresses to this appointment.  4. Total time is 1 to 2 hours; you may want to  bring reading material for the waiting time.  5. Please report to Radiology at the [facility] Main Entrance 30 minutes early for your test.  [address] [place], [place] [address]  6. Please report to Radiology at [facility] Main Entrance, medical mall, 30 mins prior to your test.  [address]  [place], [place]  [phone]  Diabetic Preparation:  Hold oral medications. (Hold metformin and glipizide the morning of the procedure) You may take NPH and Lantus insulin. Do not take Humalog or Humulin R (Regular Insulin) the day of your test. Check blood sugars prior to leaving the house. If able to eat breakfast prior to 3 hour fasting, you may take all medications, including your insulin, Do not worry if you miss your breakfast dose of insulin - start at your next meal. Patients who wear a continuous glucose monitor MUST remove the device prior to scanning.  IF YOU THINK YOU MAY BE PREGNANT, OR ARE NURSING PLEASE INFORM THE TECHNOLOGIST.  In preparation for your appointment, medication and supplies will be purchased.  Appointment availability is limited, so if you need to cancel or reschedule, please call the Radiology Department at [phone] [facility]) OR [phone] [facility])  24 hours in advance to avoid a cancellation fee of $100.00  What to Expect After you Arrive:  Once you arrive and check in for your appointment, you will be taken to a preparation room within the Radiology Department.  A technologist or Nurse will obtain your medical history, verify that you are correctly prepped for the exam,  and explain the procedure.  Afterwards,  an IV will be started in your arm and electrodes will be placed on your skin for EKG monitoring during the stress portion of the exam. Then you will be escorted to the PET/CT scanner.  There, staff will get you positioned on the scanner and obtain a blood pressure and EKG.  During the exam, you will  continue to be connected to the EKG and blood pressure machines.  A small, safe amount of a radioactive tracer will be injected in your IV to obtain a series of pictures of your heart along with an injection of a stress agent.    After your Exam:  It is recommended that you eat a meal and drink a caffeinated beverage to counter act any effects of the stress agent.  Drink plenty of fluids for the remainder of the day and urinate frequently for the first couple of hours after the exam.  Your doctor will inform you of your test results within 7-10 business days.  For more information and frequently asked questions, please visit our website : [email or web address]  For questions about your test or how to prepare for your test, please call: Cardiac Imaging Nurse Navigators Office: [phone]    Follow-Up: At [facility], you and your health needs are our priority.  As part of our continuing mission to provide you with exceptional heart care, we have created designated Provider Care Teams.  These Care Teams include your primary Cardiologist (physician) and Advanced Practice Providers (APPs -  Physician Assistants and Nurse Practitioners) who all work together to provide you with the care you need, when you need it.  We recommend signing up for the patient portal called "MyChart".  Sign up information is provided on this After Visit Summary.  MyChart is used to connect with patients for Virtual Visits (Telemedicine).  Patients are able to view lab/test results, encounter notes, upcoming appointments, etc.  Non-urgent messages can be sent to your provider as well.   To learn more about what you can do with MyChart, go to [email or web address].    Your next appointment:   1 month(s)  Provider:   You may see Yvonne Kendall, MD or one of the following Advanced Practice Providers on your designated Care Team:   Nicolasa Ducking, NP Eula Listen, PA-C Cadence Fransico Nyzaiah, PA-C Charlsie Quest, NP

## 2023-01-04 ENCOUNTER — Emergency Department
Admission: EM | Admit: 2023-01-04 | Discharge: 2023-01-04 | Disposition: A | Discharge status: Home / Self Care | Payer: Medicare Other | Attending: Emergency Medicine | Admitting: Emergency Medicine

## 2023-01-04 ENCOUNTER — Other Ambulatory Visit: Payer: Self-pay

## 2023-01-04 ENCOUNTER — Emergency Department: Payer: Medicare Other

## 2023-01-04 ENCOUNTER — Encounter: Payer: Self-pay | Admitting: Internal Medicine

## 2023-01-04 DIAGNOSIS — I130 Hypertensive heart and chronic kidney disease with heart failure and stage 1 through stage 4 chronic kidney disease, or unspecified chronic kidney disease: Secondary | ICD-10-CM | POA: Diagnosis not present

## 2023-01-04 DIAGNOSIS — E1122 Type 2 diabetes mellitus with diabetic chronic kidney disease: Secondary | ICD-10-CM | POA: Insufficient documentation

## 2023-01-04 DIAGNOSIS — I509 Heart failure, unspecified: Secondary | ICD-10-CM | POA: Insufficient documentation

## 2023-01-04 DIAGNOSIS — I129 Hypertensive chronic kidney disease with stage 1 through stage 4 chronic kidney disease, or unspecified chronic kidney disease: Secondary | ICD-10-CM | POA: Diagnosis not present

## 2023-01-04 DIAGNOSIS — Z711 Person with feared health complaint in whom no diagnosis is made: Secondary | ICD-10-CM | POA: Diagnosis not present

## 2023-01-04 DIAGNOSIS — N179 Acute kidney failure, unspecified: Secondary | ICD-10-CM | POA: Insufficient documentation

## 2023-01-04 DIAGNOSIS — N189 Chronic kidney disease, unspecified: Secondary | ICD-10-CM | POA: Diagnosis not present

## 2023-01-04 DIAGNOSIS — E875 Hyperkalemia: Secondary | ICD-10-CM | POA: Diagnosis not present

## 2023-01-04 DIAGNOSIS — R7989 Other specified abnormal findings of blood chemistry: Secondary | ICD-10-CM | POA: Diagnosis not present

## 2023-01-04 LAB — COMPREHENSIVE METABOLIC PANEL
ALT: 28 [IU]/L (ref 0–44)
ALT: 29 U/L (ref 0–44)
AST: 16 [IU]/L (ref 0–40)
AST: 19 U/L (ref 15–41)
Albumin: 3.9 g/dL (ref 3.8–4.8)
Albumin: 3.7 g/dL (ref 3.5–5.0)
Alkaline Phosphatase: 92 [IU]/L (ref 44–121)
Alkaline Phosphatase: 73 U/L (ref 38–126)
Anion gap: 10 (ref 5–15)
BUN/Creatinine Ratio: 34 — ABNORMAL HIGH (ref 10–24)
BUN: 63 mg/dL — ABNORMAL HIGH (ref 8–27)
BUN: 63 mg/dL — ABNORMAL HIGH (ref 8–23)
Bilirubin Total: 0.3 mg/dL (ref 0.0–1.2)
CO2: 20 mmol/L (ref 20–29)
CO2: 25 mmol/L (ref 22–32)
Calcium: 9.5 mg/dL (ref 8.6–10.2)
Calcium: 9 mg/dL (ref 8.9–10.3)
Chloride: 102 mmol/L (ref 96–106)
Chloride: 101 mmol/L (ref 98–111)
Creatinine, Ser: 1.88 mg/dL — ABNORMAL HIGH (ref 0.76–1.27)
Creatinine, Ser: 1.75 mg/dL — ABNORMAL HIGH (ref 0.61–1.24)
GFR, Estimated: 41 mL/min — ABNORMAL LOW (ref >60)
Globulin, Total: 2.4 g/dL (ref 1.5–4.5)
Glucose, Bld: 141 mg/dL — ABNORMAL HIGH (ref 70–99)
Glucose: 177 mg/dL — ABNORMAL HIGH (ref 70–99)
Potassium: 6.4 mmol/L — ABNORMAL HIGH (ref 3.5–5.2)
Potassium: 4.7 mmol/L (ref 3.5–5.1)
Sodium: 138 mmol/L (ref 134–144)
Sodium: 136 mmol/L (ref 135–145)
Total Bilirubin: 0.8 mg/dL (ref 0.3–1.2)
Total Protein: 6.3 g/dL (ref 6.0–8.5)
Total Protein: 7.2 g/dL (ref 6.5–8.1)
eGFR: 37 mL/min/{1.73_m2} — ABNORMAL LOW (ref >59)

## 2023-01-04 LAB — CBC
Hematocrit: 44.3 % (ref 37.5–51.0)
Hemoglobin: 15 g/dL (ref 13.0–17.7)
MCH: 33.9 pg — ABNORMAL HIGH (ref 26.6–33.0)
MCHC: 33.9 g/dL (ref 31.5–35.7)
MCV: 100 fL — ABNORMAL HIGH (ref 79–97)
Platelets: 237 10*3/uL (ref 150–450)
RBC: 4.43 x10E6/uL (ref 4.14–5.80)
RDW: 12.4 % (ref 11.6–15.4)
WBC: 5.8 10*3/uL (ref 3.4–10.8)

## 2023-01-04 LAB — TSH: TSH: 1.88 u[IU]/mL (ref 0.450–4.500)

## 2023-01-04 NOTE — Discharge Instructions (Signed)
You were seen in the emergency department today for evaluation of your abnormal labs.  Fortunately your repeat blood test here showed a normal potassium and some improvement in your kidney function.  Please continue to not take your valsartan and spironolactone unless otherwise directed by your doctors.  Follow-up with your primary care doctor and cardiology for further evaluation.  Return to the ER for any new or worsening symptoms.

## 2023-01-04 NOTE — ED Triage Notes (Addendum)
Had blood work done yesterday, called this morning for evaluation of elevated potassium and kidney function. Patient denies symptoms/ complaint.  Reports feeling dizzy and off balance x several weeks.

## 2023-01-04 NOTE — ED Provider Notes (Signed)
[facility] Provider Note    Event Date/Time   First MD Initiated Contact with Patient 01/04/23 [phone]     (approximate)   History   abnormal labs   HPI  Evan Rivas is a 72 year old male with history of CHF, hyperaldosteronism secondary to adrenal hyperplasia, HTN, T2DM presenting to the emergency department for evaluation of abnormal labs.  He was seen yesterday by his cardiologist for progressive fatigue and exertional dyspnea.  Labs were sent at that time which resulted with increased creatinine at 1.88 as well as increased potassium at 6.4.  It was recommended that he present to the ER for repeat blood work and he discontinue his valsartan HCTZ and spironolactone. Multiple family members including patient were sick with COVID a few weeks ago, but patient reports no new symptoms for the past few days.     Physical Exam   Triage Vital Signs: ED Triage Vitals  Encounter Vitals Group     BP 01/04/23 0931 (!) 118/98     Systolic BP Percentile --      Diastolic BP Percentile --      Pulse Rate 01/04/23 0931 75     Resp 01/04/23 0931 16     Temp 01/04/23 0931 97.6 F (36.4 C)     Temp Source 01/04/23 0931 Oral     SpO2 01/04/23 0931 100 %     Weight 01/04/23 0930 241 lb 2.9 oz (109.4 kg)     Height 01/04/23 0930 6\' 3"  (1.905 m)     Head Circumference --      Peak Flow --      Pain Score 01/04/23 0930 0     Pain Loc --      Pain Education --      Exclude from Growth Chart --     Most recent vital signs: Vitals:   01/04/23 1100 01/04/23 1130  BP: 113/66 122/74  Pulse: 67 68  Resp: 12 17  Temp:    SpO2: 97% 99%     General: Awake, interactive  CV:  Regular rate, good peripheral perfusion.  Resp:  Lungs clear, unlabored respirations.  Abd:  Soft, nondistended.  Neuro:  Symmetric facial movement, fluid speech   ED Results / Procedures / Treatments   Labs (all labs ordered are listed, but only abnormal results are displayed) Labs  Reviewed  COMPREHENSIVE METABOLIC PANEL - Abnormal; Notable for the following components:      Result Value   Glucose, Bld 141 (*)    BUN 63 (*)    Creatinine, Ser 1.75 (*)    GFR, Estimated 41 (*)    All other components within normal limits  CBC     EKG EKG independently reviewed interpreted by myself (ER attending) demonstrates:  EKG demonstrates sinus rhythm at a rate of 68, PR 352, QRS 72, QTc 421, increased first-degree AV block compared to prior  RADIOLOGY Imaging independently reviewed and interpreted by myself demonstrates:  CXR without focal consolidation on my review  PROCEDURES:  Critical Care performed: No  Procedures   MEDICATIONS ORDERED IN ED: Medications - No data to display   IMPRESSION / MDM / ASSESSMENT AND PLAN / ED COURSE  I reviewed the triage vital signs and the nursing notes.  Differential diagnosis includes, but is not limited to, medication adverse effect, endocrine dysfunction, pseudo hypokalemia secondary to hemolysis  Patient's presentation is most consistent with acute presentation with potential threat to life or bodily function.  72 year old male  presenting after outpatient lab work demonstrated worsening renal function and elevated potassium at 6.4.  Patient without acute symptoms.  EKG without evidence of hyperkalemia.  Will repeat labs to further evaluate.  Lab work here fortunately significantly improved with a potassium of 4.7.  Creatinine also slightly improved at 1.75.  Remains above patient's baseline around 1.46.  I suspect that this could be related to his diuretic use which he has been instructed to hold by doctor and his cardiologist.  He is not clinically dry on exam here and does have a history of heart failure, so do think it is reasonable to hold off on IV fluids.  Discussed this plan with the patient he was comfortable.  He is eager to be discharged home which I do think is reasonable.  Strict return precautions provided.   Patient discharged in stable condition.     FINAL CLINICAL IMPRESSION(S) / ED DIAGNOSES   Final diagnoses:  Acute renal failure superimposed on chronic kidney disease, unspecified acute renal failure type, unspecified CKD stage (HCC)  Feared complaint without diagnosis     Rx / DC Orders   ED Discharge Orders     None        Note:  This document was prepared using Dragon voice recognition software and may include unintentional dictation errors.   Trinna Post, MD 01/04/23 [phone]

## 2023-01-07 NOTE — Addendum Note (Signed)
Addended by: Runell Kovich A on: 01/07/2023 09:12 AM   Modules accepted: Orders

## 2023-01-10 ENCOUNTER — Other Ambulatory Visit: Payer: Medicare Other | Admitting: Pharmacist

## 2023-01-10 ENCOUNTER — Encounter: Payer: Self-pay | Admitting: Internal Medicine

## 2023-01-10 ENCOUNTER — Encounter: Payer: Self-pay | Admitting: Pharmacist

## 2023-01-10 DIAGNOSIS — Z79899 Other long term (current) drug therapy: Secondary | ICD-10-CM

## 2023-01-10 NOTE — Patient Instructions (Signed)
Goals Addressed             This Visit's Progress    Pharmacy Goals       It was a pleasure speaking with you today!   Please watch the mail for an envelope from [employer] containing the patient assistance program application. Please complete this application and mail back to [facility] Pharmacy Technician Noreene Larsson [place] along with a copy of your Medicare Part D prescription card and a copy of your proof of income document OR you can bring these documents to the office to have them faxed back to Attention: [name] at Fax # [phone]   If you need to call Noreene Larsson, you can reach her at [phone]   In the future, if you need to reach out to patient assistance programs regarding refills or to find out the status of your application, you can do so by calling:   [employer] at [phone] [employer] at [phone]   Thank you!   Estelle Grumbles, PharmD, [facility] [phone]

## 2023-01-10 NOTE — Progress Notes (Signed)
01/10/2023 Name: Evan Rivas MRN: [id number] DOB: 12/05/1950  Chief Complaint  Patient presents with   Medication Management   Medication Assistance    Evan Rivas is a 72 y.o. year old male who presented for a telephone visit.   They were referred to the pharmacist by their PCP for assistance in managing diabetes and medication access.    Subjective:  Care Team: Primary Care Provider: Berniece Salines, FNP ; Next Scheduled Visit: 01/25/2023 Cardiologist: Evan Kendall, MD ; Next Scheduled Visit: 02/12/2023 Nephrologist: Evan Pigeon, MD; Next Scheduled Visit: 02/26/2023  Medication Access/Adherence  Current Pharmacy:  [facility] [address] [facility] - [place], [place] - [address] AT [facility] [address] [place] [address] Phone: [phone] Fax: [phone]  [facility] - [place], [place] - [address] [place] [address] Phone: [phone] Fax: [phone]  [facility] - [place], [place] - [address] [place] [address] Phone: [phone] Fax: [phone]   Patient reports affordability concerns with their medications: No  Patient reports access/transportation concerns to their pharmacy: No  Patient reports adherence concerns with their medications:  No     Diabetes:  Current medications:  - Farxiga 10 mg daily before breakfast - Trulicity 4.5 mg weekly on Wednesdays Once uses up current supply of Trulicity, then next Wednesday plans to start Ozempic 1 mg weekly as discussed with PCP - glipizide 10 mg daily 30 minutes before breakfast  - metformin 1000 mg twice daily   Current glucose readings: morning fasting readings ranging 133-178 *Attributes variation to eating habits  Patient denies hypoglycemic s/sx including dizziness, shakiness, sweating.   Reports has made changes to improve his diet, including cutting out cookies and  chips  Current meal patterns:  - Breakfast: bowl of cereal with banana - Lunch: sometimes skips; sandwich (meat and cheese) - Supper: chicken over pasta with salad - Snacks: Jello cup with fruit or granola bar - Drinks: diet soda and zero Gatorade and sometimes water   Current physical activity: currently limited while completing testing from Cardiology  Statin therapy: atorvastatin 40 mg daily  Current medication access support:  - Enrolled in patient assistance for Farxiga through [employer] through 04/03/2023 - Enrolled in patient assistance for Ozempic through [employer] through 04/03/2023   Hypertension:  Patient followed by Evan Rivas  From review of chart: - Patient seen from Office Visit with Dr. Okey Rivas on 01/03/2023. Provider recommended:   Pursue a myocardial PET/CT to evaluate for underlying CAD  Decrease his valsartan 320/12.5 mg - take 1/2 tablet daily due to borderline low BP in office - Per lab note from Cardiologist on 10/3, provider advised patient "kidney function is a little worse than baseline and his potassium is quite elevated.  I recommend that he go to the ER to have a basic metabolic panel drawn immediately and his hyperkalemia treated if it is confirmed to be high.  He should not take any more valsartan-HCTZ or spironolactone (no longer on his medication list though he took some that he was previously prescribed recently due to edema)" - Note patient seen in [facility] ED on 10/3 and potassium improved to 4.7. Creatinine slightly improved at 1.75.  - In follow up MyChart message on 10/4, Cardiologist recommended patient decrease his metoprolol from 100 mg daily to 50 mg    Current medications:  - metoprolol ER 100 mg -1/2 tablet (  50 mg) daily - Today patient reports that he did take valsartan 320/12.5 mg 1/2 tablet daily yesterday as received a new prescription for this from his [facility], but has not yet taken today From review of chart, appears that this  prescription was sent by Cardiologist on 10/2, but then discontinued in Epic on 10/3 related to lab note from Cardiologist re: hyperkalemia (no longer on med list) Patient states that he will get in touch with Cardiologist today to confirm plan re: valsartan-HCTZ, prior to taking further doses  Medications previously tried: spironolactone (CKD, hyperkalemia), valsartan-HCTZ  Patient has an automated, upper arm home BP cuff Current blood pressure readings readings: today: 130/90, HR 77  Patient denies hypotension, but is following up with Cardiology regarding occasional dizziness - Note imaging scheduled for 10/17 and 10/22  Current physical activity: reports currently limited while completing testing from Cardiology   Objective:  Lab Results  Component Value Date   HGBA1C 8.2 (A) 11/20/2022    Lab Results  Component Value Date   CREATININE 1.75 (H) 01/04/2023   BUN 63 (H) 01/04/2023   NA 136 01/04/2023   K 4.7 01/04/2023   CL 101 01/04/2023   CO2 25 01/04/2023    Lab Results  Component Value Date   CHOL 136 07/28/2022   HDL 37 (L) 07/28/2022   LDLCALC 70 07/28/2022   TRIG 232 (H) 07/28/2022   CHOLHDL 3.7 07/28/2022   BP Readings from Last 3 Encounters:  01/04/23 122/74  01/03/23 94/68  01/02/23 105/71   Pulse Readings from Last 3 Encounters:  01/04/23 68  01/03/23 83  01/02/23 (!) 58     Medications Reviewed Today     Reviewed by Evan Rivas, RPH-CPP (Pharmacist) on 01/10/23 at 1029  Med List Status: <None>   Medication Order Taking? Sig Documenting Provider Last Dose Status Informant  aspirin EC 81 MG tablet [id number] Yes Take 81 mg by mouth daily. Swallow whole. [provider] Taking Active Self  atorvastatin (LIPITOR) 40 MG tablet [id number] Yes TAKE 1 TABLET AT BEDTIME Margarita Mail, DO Taking Active   clotrimazole (LOTRIMIN) 1 % cream [id number]  Apply 1 application topically 2 (two) times daily. Apply for 7-10 days. Carman Ching, PA-C  Active   dapagliflozin propanediol (FARXIGA) 10 MG TABS tablet [id number] Yes Take 1 tablet (10 mg total) by mouth daily before breakfast. Patient receives via [employer] Patient Assistance Program Della Goo F, [place] Taking Active   Dulaglutide (TRULICITY) 4.5 MG/0.5ML Namon Cirri [id number] Yes Inject 4.5 mg as directed once a week. Evan Salines, FNP Taking Active   DULoxetine (CYMBALTA) 60 MG capsule [id number] Yes TAKE 1 CAPSULE DAILY (MUST ATTEND DECEMBER APPOINTMENT FOR FURTHER REFILLS) Evan Salines, FNP Taking Active   fluticasone [facility]) 50 MCG/ACT nasal spray [id number] Yes USE 2 SPRAYS IN EACH NOSTRIL DAILY Evan Salines, FNP Taking Active   gabapentin (NEURONTIN) 600 MG tablet [id number] Yes Take 1 tablet (600 mg total) by mouth 3 (three) times daily. Evan Salines, FNP Taking Active   glipiZIDE (GLUCOTROL) 10 MG tablet [id number] Yes Take 1 tablet (10 mg total) by mouth daily before breakfast. Evan Salines, FNP Taking Active   glucose blood (TRUE METRIX BLOOD GLUCOSE TEST) test strip [id number]  Test  blood sugar twice a day Evan Salines, FNP  Active   HYDROcodone-acetaminophen [facility]) 10-325 MG tablet [id number]  Take 0.5-1 tablets by mouth every 8 (eight) hours as needed for severe pain. Gradually reducing dose Evan Salines,  FNP  Active   HYDROcodone-acetaminophen (NORCO) 10-325 MG tablet [id number] Yes Take 0.5-1 tablets by mouth every 8 (eight) hours as needed for severe pain. Gradually reducing dose Evan Salines, FNP Taking Active   HYDROcodone-acetaminophen [facility]) 10-325 MG tablet [id number]  Take 0.5-1 tablets by mouth every 8 (eight) hours as needed for severe pain. Gradually reducing dose Evan Salines, FNP  Active   ketoconazole (NIZORAL) 2 % cream [id number] Yes Apply to the feet QHS. Deirdre Evener, MD Taking Active   melatonin 3 MG TABS tablet [id number] Yes Take 3 mg by mouth at bedtime. [provider] Taking Active Self  metFORMIN (GLUCOPHAGE)  1000 MG tablet [id number] Yes TAKE 1 TABLET TWICE A DAY WITH A MEAL Evan Salines, FNP Taking Active   metoprolol succinate (TOPROL-XL) 100 MG 24 hr tablet [id number] Yes TAKE 1 TABLET DAILY WITH OR IMMEDIATELY FOLLOWING A MEAL  Patient taking differently: 50 mg daily.   Evan Salines, FNP Taking Active   Multiple Vitamin (MULTIVITAMIN) tablet [id number] Yes Take 1 tablet by mouth daily. [provider] Taking Active Self  sildenafil (REVATIO) 20 MG tablet [id number] No Take 1 tablet (20 mg total) by mouth as needed. Take 1-5 tabs as needed prior to intercourse  Patient not taking: Reported on 01/10/2023   Vanna Scotland, MD Not Taking Active   tiZANidine (ZANAFLEX) 4 MG tablet [id number] Yes TAKE 1 TABLET EVERY 6 HOURS AS NEEDED FOR MUSCLE SPASMS Evan Salines, FNP Taking Active   Med List Note Nonah Mattes, RN 04/23/18 1019): UDS 02/26/18 MR 08/06/2018 Opiod contract signed by pt.               Assessment/Plan:   Comprehensive medication review performed; medication list updated in electronic medical record - Caution patient for risk of dizziness/sedation with gabapentin, Norco and tizanidine, particularly if taken in combination  Patient verbalizes understanding  Diabetes: - Currently uncontrolled - Reviewed long term cardiovascular and renal outcomes of uncontrolled blood sugar - Reviewed goal A1c, goal fasting, and goal 2 hour post prandial glucose - Reviewed dietary modifications including importance of having regular well-balanced meals and snacks throughout the day, while controlling carbohydrate portion sizes             Encourage patient to to avoid consumption of sugary beverages             Encourage patient to increase consumption of non-starchy vegetables  Encourage to review nutrition labels for carbohydrate content of foods - Patient to pick up supply of Ozempic 1 mg once received in office from assistance program. Patient not to start Ozempic (1 mg  weekly on Wednsedays) until 1 week after uses up his last dose of Trulicity  Send patient link to Ozempic "how to take" video via MyChart as requested - Recommend to check glucose, keep log of results and have this record to review at upcoming medical appointments. Patient to contact provider office sooner if needed for readings outside of established parameters or symptoms - Meets financial criteria for Ozempic patient assistance program through [employer]. Will collaborate with provider, CPhT, and patient to pursue assistance re-enrollment for 2025. - Patient to follow up with [employer] via [employer] email link for re-enrollment in patient assistance for Farxiga Will plan to collaborate with PCP in January to request new prescription be sent to [facility], dispensing pharmacy for [employer] - Will collaborate with PCP regarding recent worsening of patient's renal function and renal dosing of metformin if renal function  not improved   Hypertension: - Reviewed appropriate blood pressure monitoring technique and reviewed goal blood pressure.  - Recommended to check home blood pressure and heart rate, keep log of results and have this record to review at upcoming medical appointments. Patient to contact provider office sooner if needed for readings outside of established parameters or symptoms - Recommend to follow up with Cardiology as planned   Follow Up Plan: Clinical Pharmacist will follow up with patient by telephone on 04/11/2023 at 10:00 AM   Estelle Grumbles, PharmD, [facility] [phone]

## 2023-01-11 NOTE — Telephone Encounter (Signed)
Given his recent episode of acute kidney injury and hyperkalemia, I recommend that Evan Rivas remain off valsartan-HCTZ at this time.  We should repeat a BMP early next week to ensure that his kidney function and electrolytes have normalized.  At that time, we can readdress initiation of valsartan or alternative therapy.  In regard to his question about exercise, I recommend that Evan Rivas defer starting an exercise regimen until we have completed the echocardiogram and myocardial PET/CT discussed at our recent visit.  If the studies are reassuring, I think it would be great for Evan Rivas to begin exercising regularly.  Yvonne Kendall, MD [facility]

## 2023-01-11 NOTE — Telephone Encounter (Signed)
Pt made aware of MD's recommendations and verbalized understanding.

## 2023-01-12 ENCOUNTER — Encounter: Payer: Self-pay | Admitting: Nurse Practitioner

## 2023-01-12 NOTE — Telephone Encounter (Signed)
Called pt and asked what dose. Pt was unsure and Evan Rivas happened to be with me when I was on the phone with Evan Rivas. Evan Rivas told me to ask him what color is the box he stated aqua and Evan Rivas stated that was the 1 mg. Pt then verbalized it was 1 mg ad Evan Rivas told me to tell him to do 1 mg weekly. Pt verbalized understanding

## 2023-01-15 ENCOUNTER — Other Ambulatory Visit: Payer: Self-pay | Admitting: Nurse Practitioner

## 2023-01-15 DIAGNOSIS — J301 Allergic rhinitis due to pollen: Secondary | ICD-10-CM

## 2023-01-16 ENCOUNTER — Telehealth (HOSPITAL_COMMUNITY): Payer: Self-pay | Admitting: Emergency Medicine

## 2023-01-16 ENCOUNTER — Encounter (HOSPITAL_COMMUNITY): Payer: Self-pay

## 2023-01-16 ENCOUNTER — Other Ambulatory Visit: Payer: Self-pay

## 2023-01-16 DIAGNOSIS — Z79899 Other long term (current) drug therapy: Secondary | ICD-10-CM | POA: Diagnosis not present

## 2023-01-16 NOTE — Telephone Encounter (Signed)
Requested medication (s) are due for refill today: yes  Requested medication (s) are on the active medication list: yes  Last refill:  10/05/21  Future visit scheduled: yes  Notes to clinic:  Unable to refill per protocol, cannot delegate.      Requested Prescriptions  Pending Prescriptions Disp Refills   tiZANidine (ZANAFLEX) 4 MG tablet [Pharmacy Med Name: TIZANIDINE HCL TABS 4MG ] 270 tablet 4    Sig: TAKE 1 TABLET EVERY 6 HOURS AS NEEDED FOR MUSCLE SPASMS     Not Delegated - Cardiovascular:  Alpha-2 Agonists - tizanidine Failed - 01/15/2023 10:09 AM      Failed - This refill cannot be delegated      Passed - Valid encounter within last 6 months    Recent Outpatient Visits           1 month ago COVID-19   [facility] Della Goo F, FNP   2 months ago Type 2 diabetes mellitus without complication, without long-term current use of insulin [facility])   [facility] Berniece Salines, FNP   5 months ago DDD (degenerative disc disease), lumbar   [facility] Berniece Salines, FNP   8 months ago DDD (degenerative disc disease), lumbar   [facility] Berniece Salines, FNP   9 months ago Viral upper respiratory tract infection   [facility] Berniece Salines, FNP       Future Appointments             In 1 week Zane Herald, Rudolpho Sevin, FNP [facility]   In 3 weeks End, Cristal Deer, MD [facility] [place]   In 2 months  [facility]   In 7 months Deirdre Evener, MD [place] [facility]            Signed Prescriptions Disp Refills   fluticasone (FLONASE) 50 MCG/ACT nasal spray 16 g 0    Sig: USE 2 SPRAYS IN EACH NOSTRIL DAILY     Ear, Nose, and Throat: Nasal Preparations - Corticosteroids Passed - 01/15/2023 10:09 AM      Passed - Valid encounter within last 12  months    Recent Outpatient Visits           1 month ago COVID-19   [facility] Della Goo F, FNP   2 months ago Type 2 diabetes mellitus without complication, without long-term current use of insulin [facility])   [place] [facility] Berniece Salines, FNP   5 months ago DDD (degenerative disc disease), lumbar   [facility] Berniece Salines, FNP   8 months ago DDD (degenerative disc disease), lumbar   [facility] [place] [facility] Berniece Salines, FNP   9 months ago Viral upper respiratory tract infection   [facility] Berniece Salines, FNP       Future Appointments             In 1 week Zane Herald, Rudolpho Sevin, FNP [facility]   In 3 weeks End, Cristal Deer, MD [facility] [place]   In 2 months  [facility]   In 7 months Deirdre Evener, MD [facility]

## 2023-01-16 NOTE — Telephone Encounter (Signed)
Attempted to call patient regarding upcoming cardiac PET appointment. Left message on voicemail with name and callback number Rockwell Alexandria RN Navigator Cardiac Imaging [facility] [phone] Office [phone] Cell

## 2023-01-16 NOTE — Telephone Encounter (Signed)
Requested Prescriptions  Pending Prescriptions Disp Refills   tiZANidine (ZANAFLEX) 4 MG tablet [Pharmacy Med Name: TIZANIDINE HCL TABS 4MG ] 270 tablet 4    Sig: TAKE 1 TABLET EVERY 6 HOURS AS NEEDED FOR MUSCLE SPASMS     Not Delegated - Cardiovascular:  Alpha-2 Agonists - tizanidine Failed - 01/15/2023 10:09 AM      Failed - This refill cannot be delegated      Passed - Valid encounter within last 6 months    Recent Outpatient Visits           1 month ago COVID-19   [facility] Della Goo F, FNP   2 months ago Type 2 diabetes mellitus without complication, without long-term current use of insulin [facility])   [facility] Berniece Salines, FNP   5 months ago DDD (degenerative disc disease), lumbar   [facility] Berniece Salines, FNP   8 months ago DDD (degenerative disc disease), lumbar   [facility] Berniece Salines, FNP   9 months ago Viral upper respiratory tract infection   [facility] Berniece Salines, FNP       Future Appointments             In 1 week Zane Herald, Rudolpho Sevin, FNP [facility]   In 3 weeks End, Cristal Deer, MD [facility] [place]   In 2 months  [facility]   In 7 months Deirdre Evener, MD [facility]             fluticasone [facility]) 50 MCG/ACT nasal spray [Pharmacy Med Name: FLUTICASONE PROPIONATE NASAL SPRAY 50MCG] 16 g 0    Sig: USE 2 SPRAYS IN EACH NOSTRIL DAILY     Ear, Nose, and Throat: Nasal Preparations - Corticosteroids Passed - 01/15/2023 10:09 AM      Passed - Valid encounter within last 12 months    Recent Outpatient Visits           1 month ago COVID-19   [facility] Della Goo F, FNP   2 months ago Type 2 diabetes mellitus without complication, without long-term current use of  insulin [facility])   [facility] Berniece Salines, FNP   5 months ago DDD (degenerative disc disease), lumbar   [facility] Berniece Salines, FNP   8 months ago DDD (degenerative disc disease), lumbar   [facility] Berniece Salines, FNP   9 months ago Viral upper respiratory tract infection   [facility] Berniece Salines, FNP       Future Appointments             In 1 week Zane Herald, Rudolpho Sevin, FNP [facility], [facility]   In 3 weeks End, Cristal Deer, MD [facility] [place]   In 2 months  [facility]   In 7 months Deirdre Evener, MD [facility]

## 2023-01-17 ENCOUNTER — Telehealth (HOSPITAL_COMMUNITY): Payer: Self-pay | Admitting: Emergency Medicine

## 2023-01-17 LAB — BASIC METABOLIC PANEL
BUN/Creatinine Ratio: 21 (ref 10–24)
BUN: 27 mg/dL (ref 8–27)
CO2: 24 mmol/L (ref 20–29)
Calcium: 9.2 mg/dL (ref 8.6–10.2)
Chloride: 108 mmol/L — ABNORMAL HIGH (ref 96–106)
Creatinine, Ser: 1.3 mg/dL — ABNORMAL HIGH (ref 0.76–1.27)
Glucose: 162 mg/dL — ABNORMAL HIGH (ref 70–99)
Potassium: 4.9 mmol/L (ref 3.5–5.2)
Sodium: 146 mmol/L — ABNORMAL HIGH (ref 134–144)
eGFR: 58 mL/min/{1.73_m2} — ABNORMAL LOW (ref >59)

## 2023-01-17 NOTE — Telephone Encounter (Signed)
Reaching out to patient to offer assistance regarding upcoming cardiac imaging study; pt verbalizes understanding of appt date/time, parking situation and where to check in, pre-test NPO status and medications ordered, and verified current allergies; name and call back number provided for further questions should they arise Cayne Yom RN Navigator Cardiac Imaging [facility] [phone] office [phone] cell 

## 2023-01-18 ENCOUNTER — Telehealth: Payer: Self-pay | Admitting: Pharmacy Technician

## 2023-01-18 ENCOUNTER — Ambulatory Visit
Admission: RE | Admit: 2023-01-18 | Discharge: 2023-01-18 | Disposition: A | Discharge status: Home / Self Care | Payer: Medicare Other | Source: Ambulatory Visit | Attending: Internal Medicine | Admitting: Internal Medicine

## 2023-01-18 DIAGNOSIS — R0609 Other forms of dyspnea: Secondary | ICD-10-CM | POA: Diagnosis not present

## 2023-01-18 DIAGNOSIS — I700 Atherosclerosis of aorta: Secondary | ICD-10-CM | POA: Insufficient documentation

## 2023-01-18 DIAGNOSIS — K760 Fatty (change of) liver, not elsewhere classified: Secondary | ICD-10-CM | POA: Insufficient documentation

## 2023-01-18 DIAGNOSIS — Z5986 Financial insecurity: Secondary | ICD-10-CM

## 2023-01-18 MED ORDER — RUBIDIUM RB82 GENERATOR (RUBYFILL)
25.0000 | PACK | Freq: Once | INTRAVENOUS | Status: AC
  Administered 2023-01-18: 24.94 via INTRAVENOUS

## 2023-01-18 MED ORDER — REGADENOSON 0.4 MG/5ML IV SOLN
INTRAVENOUS | Status: AC
  Filled 2023-01-18: qty 5

## 2023-01-18 MED ORDER — REGADENOSON 0.4 MG/5ML IV SOLN
0.4000 mg | Freq: Once | INTRAVENOUS | Status: AC
  Administered 2023-01-18: 0.4 mg via INTRAVENOUS
  Filled 2023-01-18: qty 5

## 2023-01-18 NOTE — Progress Notes (Signed)
[facility] Customer service manager [facility])                                            [facility] Quality Pharmacy Team    01/18/2023  Evan Rivas [birth year] [id number]                                      Medication Assistance Referral  Referral From: [facility] Embedded RPh Vallery Sa   Medication/Company: Franki Monte / [employer] Patient application portion:  Mining engineer portion: Faxed  to Della Goo, FNP Provider address/fax verified via: Office website  [name], CPhT [place]  Office: [phone] Fax: [phone] Email: [email or web address]

## 2023-01-22 ENCOUNTER — Other Ambulatory Visit: Payer: Self-pay

## 2023-01-22 DIAGNOSIS — Z79899 Other long term (current) drug therapy: Secondary | ICD-10-CM

## 2023-01-22 LAB — NM PET CT CARDIAC PERFUSION MULTI W/ABSOLUTE BLOODFLOW
LV dias vol: 110 mL (ref 62–150)
LV sys vol: 35 mL
MBFR: 2.45
Nuc Stress EF: 68 %
Peak HR: 83 {beats}/min
Rest HR: 75 {beats}/min
Rest MBF: 0.85 ml/g/min
Rest Nuclear Isotope Dose: 24.9 mCi
SRS: 1
SSS: 0
ST Depression (mm): 0 mm
Stress MBF: 2.08 ml/g/min
Stress Nuclear Isotope Dose: 24.9 mCi
TID: 1.16

## 2023-01-22 MED ORDER — VALSARTAN 80 MG PO TABS: 80.0000 mg | ORAL_TABLET | Freq: Every day | ORAL | 3 refills | Status: DC

## 2023-01-23 ENCOUNTER — Ambulatory Visit: Payer: Medicare Other | Attending: Internal Medicine

## 2023-01-23 DIAGNOSIS — R0609 Other forms of dyspnea: Secondary | ICD-10-CM

## 2023-01-23 LAB — ECHOCARDIOGRAM COMPLETE: Area-P 1/2: 2.91 cm2

## 2023-01-25 ENCOUNTER — Ambulatory Visit: Payer: Medicare Other | Admitting: Nurse Practitioner

## 2023-01-25 ENCOUNTER — Other Ambulatory Visit: Payer: Self-pay

## 2023-01-25 ENCOUNTER — Encounter: Payer: Self-pay | Admitting: Nurse Practitioner

## 2023-01-25 VITALS — BP 126/82 | HR 98 | Temp 97.9°F | Resp 16 | Ht 75.0 in | Wt 254.3 lb

## 2023-01-25 DIAGNOSIS — M5136 Other intervertebral disc degeneration, lumbar region with discogenic back pain only: Secondary | ICD-10-CM

## 2023-01-25 DIAGNOSIS — I700 Atherosclerosis of aorta: Secondary | ICD-10-CM | POA: Diagnosis not present

## 2023-01-25 DIAGNOSIS — M51362 Other intervertebral disc degeneration, lumbar region with discogenic back pain and lower extremity pain: Secondary | ICD-10-CM

## 2023-01-25 DIAGNOSIS — F419 Anxiety disorder, unspecified: Secondary | ICD-10-CM

## 2023-01-25 DIAGNOSIS — N1831 Chronic kidney disease, stage 3a: Secondary | ICD-10-CM

## 2023-01-25 DIAGNOSIS — F3341 Major depressive disorder, recurrent, in partial remission: Secondary | ICD-10-CM

## 2023-01-25 DIAGNOSIS — E119 Type 2 diabetes mellitus without complications: Secondary | ICD-10-CM

## 2023-01-25 DIAGNOSIS — E1169 Type 2 diabetes mellitus with other specified complication: Secondary | ICD-10-CM | POA: Diagnosis not present

## 2023-01-25 DIAGNOSIS — Z23 Encounter for immunization: Secondary | ICD-10-CM

## 2023-01-25 DIAGNOSIS — E785 Hyperlipidemia, unspecified: Secondary | ICD-10-CM

## 2023-01-25 DIAGNOSIS — M545 Low back pain, unspecified: Secondary | ICD-10-CM

## 2023-01-25 DIAGNOSIS — I10 Essential (primary) hypertension: Secondary | ICD-10-CM | POA: Diagnosis not present

## 2023-01-25 DIAGNOSIS — F1120 Opioid dependence, uncomplicated: Secondary | ICD-10-CM

## 2023-01-25 DIAGNOSIS — R270 Ataxia, unspecified: Secondary | ICD-10-CM

## 2023-01-25 LAB — POCT GLYCOSYLATED HEMOGLOBIN (HGB A1C): Hemoglobin A1C: 7.2 % — AB (ref 4.0–5.6)

## 2023-01-25 MED ORDER — TOUJEO MAX SOLOSTAR 300 UNIT/ML ~~LOC~~ SOPN: 10.0000 [IU] | PEN_INJECTOR | Freq: Every day | SUBCUTANEOUS | 5 refills | Status: DC

## 2023-01-25 MED ORDER — HYDROCODONE-ACETAMINOPHEN 10-325 MG PO TABS: 0.5000 | ORAL_TABLET | Freq: Three times a day (TID) | ORAL | 0 refills | Status: DC | PRN

## 2023-01-25 NOTE — Assessment & Plan Note (Signed)
Continue taking hydrocodone as needed for severe pain.  Also continue taking gabapentin 600 mg 3 times daily, Cymbalta 60 mg daily.  And tizanidine as needed.

## 2023-01-25 NOTE — Assessment & Plan Note (Signed)
Continue Cymbalta 60 mg daily. 

## 2023-01-25 NOTE — Assessment & Plan Note (Signed)
Patient recently started Ozempic.  Continue lifestyle modification.

## 2023-01-25 NOTE — Assessment & Plan Note (Signed)
Continue atorvastatin 40 mg daily.  Continue to work on lifestyle modification.

## 2023-01-25 NOTE — Assessment & Plan Note (Signed)
Continue taking metoprolol 100 mg daily and valsartan-hydrochlorothiazide 320-12.5 mg daily.

## 2023-01-25 NOTE — Assessment & Plan Note (Signed)
Patient's kidney function has improved

## 2023-01-25 NOTE — Progress Notes (Signed)
BP 126/82   Pulse 98   Temp 97.9 F (36.6 C) (Oral)   Resp 16   Ht 6\' 3"  (1.905 m)   Wt 254 lb 4.8 oz (115.3 kg)   SpO2 99%   BMI 31.79 kg/m    Subjective:    Patient ID: Evan Rivas, male    DOB: March 12, 1951, 72 y.o.   MRN: [id number]  HPI: Evan Rivas is a 72 y.o. male  Chief Complaint  Patient presents with   Medical Management of Chronic Issues   Back pain/DDD/continuous opioid dependence: Patient is currently taking gabapentin 600 mg 3 times a day, Cymbalta 60 mg daily, tizanidine as needed.  We have been working on tapering down off hydrocodone.  The plan is to taper around 5 to 10% each refill.   Patient does have chronic back pain and DDD.  PDMP was reviewed. Last visit we decrease prescription to 28 pills a month, he was getting 30 a month. Patient reports that the pain still comes and goes. Will continue with 28 pills a month.     HLD/aortic atherosclerosis: -Medications: atorvastatin 40 mg daily -Patient is compliant with above medications and reports no side effects.  -Last lipid panel:  Lipid Panel     Component Value Date/Time   CHOL 136 07/28/2022 1039   CHOL 140 09/17/2017 1014   TRIG 232 (H) 07/28/2022 1039   HDL 37 (L) 07/28/2022 1039   HDL 32 (L) 09/17/2017 1014   CHOLHDL 3.7 07/28/2022 1039   LDLCALC 70 07/28/2022 1039   LABVLDL 28 09/17/2017 1014    The 10-year ASCVD risk score (Arnett DK, et al., 2019) is: 38.3%   Values used to calculate the score:     Age: 28 years     Sex: Male     Is Non-Hispanic African American: No     Diabetic: Yes     Tobacco smoker: No     Systolic Blood Pressure: 126 mmHg     Is BP treated: Yes     HDL Cholesterol: 37 mg/dL     Total Cholesterol: 136 mg/dL   Hypertension:  -Medications: metoprolol 100 mg daily and valsartan-hydrochlorothiazide 320-12.5 mg daily -Patient is compliant with above medications and reports no side effects. -Checking BP at home (average):  -Denies any SOB, CP, vision  changes, LE edema or symptoms of hypotension -Diet: recommend DASH diet  -Exercise: recommend 150 min of physical activity weekly        01/25/2023   10:18 AM 01/18/2023   11:01 AM 01/18/2023   10:59 AM  Vitals with BMI  Height 6\' 3"     Weight 254 lbs 5 oz    BMI 31.79    Systolic 126 131 782  Diastolic 82 70 73  Pulse 98 83 88    Diabetes, Type 2:  -Last A1c 8.2 -Medications: ozempic 1 mg weekly, metformin 1000 mg BID, glipizide 10 mg daily, toujeo 10 units daily ( ran out of insulin about 2 weeks ago) -Patient is somewhat compliant with the above medications and reports no side effects.  -Checking BG at home: yes -Fasting home BG: 80-150 -Diet: reduce sugar and processed foods in your diet  -Exercise: recommend 150 min of physical activity weekly   -Eye exam: utd -Foot exam: utd -Microalbumin: due -Statin: yes -PNA vaccine: yes -Denies symptoms of hypoglycemia, polyuria, polydipsia, numbness extremities, foot ulcers/trauma.    CKD:  -CKD status: better -Last Creatinine: 1.30 -Medications renally dose: yes -Previous renal evaluation: yes -  Pneumovax:  Up to Date -Influenza Vaccine:  Not up to Date   He continues to take farxiga 10 mg daily, valsartan-hydrochlorothiazide 320-12.5 mg daily.   Obesity:  Current weight : 254 lbs BMI: 31.79 Previous weight:246 lbs Treatment Tried: lifestyle modification, trulicity, currently on ozempic Comorbidities: DM, CKD, HLD, chronic back pain, depression   Depression Medication cymbalta 60 mg daily Compliant yes Side effects none PHQ9 negative GAD negative     01/25/2023   11:10 AM 01/25/2023   10:20 AM 12/15/2022   10:20 AM 10/25/2022   10:44 AM 07/28/2022   10:20 AM  Depression screen PHQ 2/9  Decreased Interest 0 0 0 1 0  Down, Depressed, Hopeless 0 0 0 1 0  PHQ - 2 Score 0 0 0 2 0  Altered sleeping 0   0 0  Tired, decreased energy 0   0 0  Change in appetite 0   0 0  Feeling bad or failure about yourself  0   0  0  Trouble concentrating 0   0 0  Moving slowly or fidgety/restless 0   0 0  Suicidal thoughts 0   0 0  PHQ-9 Score 0   2 0  Difficult doing work/chores    Not difficult at all Not difficult at all       01/25/2023   11:10 AM 10/25/2022   10:46 AM 05/04/2022   10:32 AM 02/02/2022   10:10 AM  GAD 7 : Generalized Anxiety Score  Nervous, Anxious, on Edge 0 0 0 0  Control/stop worrying 0 0 0 0  Worry too much - different things 0 0 0 0  Trouble relaxing 0 0 0 0  Restless 0 0 0 0  Easily annoyed or irritable 0 0 0 0  Afraid - awful might happen 0 0 0 0  Total GAD 7 Score 0 0 0 0  Anxiety Difficulty  Not difficult at all  Not difficult at all   Ataxia/Off balance:  he reports that he has been running in to things.  He reports that he has been having some dizziness and it comes and goes.  He says this has been going on for some time now. He had a cardiac work up and it was normal. He has had episode of syncope, weakness. Concern for demyelinating disease.  Will order mri and refer to neurology.   Relevant past medical, surgical, family and social history reviewed and updated as indicated. Interim medical history since our last visit reviewed. Allergies and medications reviewed and updated.  Review of Systems  Constitutional: Negative for fever or weight change.  Respiratory: Negative for cough and shortness of breath.   Cardiovascular: Negative for chest pain or palpitations.  Gastrointestinal: Negative for abdominal pain, no bowel changes.  Musculoskeletal: Negative for gait problem or joint swelling.  Positive for back pain Skin: Negative for rash.  Neurological: Negative for dizziness or headache.  No other specific complaints in a complete review of systems (except as listed in HPI above).      Objective:    BP 126/82   Pulse 98   Temp 97.9 F (36.6 C) (Oral)   Resp 16   Ht 6\' 3"  (1.905 m)   Wt 254 lb 4.8 oz (115.3 kg)   SpO2 99%   BMI 31.79 kg/m   Wt Readings from Last  3 Encounters:  01/25/23 254 lb 4.8 oz (115.3 kg)  01/18/23 256 lb (116.1 kg)  01/04/23 241 lb 2.9 oz (109.4  kg)    Physical Exam  Constitutional: Patient appears well-developed and well-nourished. Obese  No distress.  HEENT: head atraumatic, normocephalic, pupils equal and reactive to light,  neck supple Cardiovascular: Normal rate, regular rhythm and normal heart sounds.  No murmur heard. No BLE edema. Pulmonary/Chest: Effort normal and breath sounds normal. No respiratory distress. Abdominal: Soft.  There is no tenderness. Neuro: equal grip, stable gait, no facial droop Psychiatric: Patient has a normal mood and affect. behavior is normal. Judgment and thought content normal.      Assessment & Plan:   Problem List Items Addressed This Visit       Cardiovascular and Mediastinum   Essential hypertension - Primary    Continue  taking metoprolol 100 mg daily and valsartan-hydrochlorothiazide 320-12.5 mg daily      Aortic atherosclerosis (HCC)    Continue atorvastatin 40 mg daily.  Continue to work on lifestyle modification.        Endocrine   Hyperlipidemia associated with type 2 diabetes mellitus (HCC)    Continue atorvastatin 10 mg daily      Relevant Medications   Semaglutide (OZEMPIC, 1 MG/DOSE, [place])   insulin glargine, 2 Unit Dial, (TOUJEO MAX SOLOSTAR) 300 UNIT/ML Solostar Pen   Type 2 diabetes mellitus without complication, without long-term current use of insulin (HCC)    Reports he ran out of insulin about 2 weeks ago.  Will continue with Ozempic 1 mg weekly metformin 1000 mg twice daily, glipizide 10 mg daily and continue Toujeo 10 units daily.      Relevant Medications   Semaglutide (OZEMPIC, 1 MG/DOSE, [place])   insulin glargine, 2 Unit Dial, (TOUJEO MAX SOLOSTAR) 300 UNIT/ML Solostar Pen   Other Relevant Orders   POCT HgB A1C (Completed)     Musculoskeletal and Integument   DDD (degenerative disc disease), lumbar    Continue taking hydrocodone as needed for  severe pain.  Also continue taking gabapentin 600 mg 3 times daily, Cymbalta 60 mg daily.  And tizanidine as needed.      Relevant Medications   HYDROcodone-acetaminophen (NORCO) 10-325 MG tablet   HYDROcodone-acetaminophen (NORCO) 10-325 MG tablet (Start on 02/24/2023)   HYDROcodone-acetaminophen (NORCO) 10-325 MG tablet (Start on 03/25/2023)     Genitourinary   Stage 3a chronic kidney disease (HCC)    Patient's kidney function has improved        Other   Anxiety    Continue Cymbalta 60 mg daily.      Continuous opioid dependence (HCC)    Continue taking hydrocodone as needed for severe pain.  Also continue taking gabapentin 600 mg 3 times daily, Cymbalta 60 mg daily.  And tizanidine as needed.      Relevant Medications   HYDROcodone-acetaminophen (NORCO) 10-325 MG tablet   HYDROcodone-acetaminophen (NORCO) 10-325 MG tablet (Start on 02/24/2023)   HYDROcodone-acetaminophen (NORCO) 10-325 MG tablet (Start on 03/25/2023)   Morbid obesity (HCC)    Patient recently started Ozempic.  Continue lifestyle modification.      Relevant Medications   Semaglutide (OZEMPIC, 1 MG/DOSE, [place])   insulin glargine, 2 Unit Dial, (TOUJEO MAX SOLOSTAR) 300 UNIT/ML Solostar Pen   Chronic low back pain    Continue taking hydrocodone as needed for severe pain.  Also continue taking gabapentin 600 mg 3 times daily, Cymbalta 60 mg daily.  And tizanidine as needed.      Relevant Medications   HYDROcodone-acetaminophen (NORCO) 10-325 MG tablet   HYDROcodone-acetaminophen (NORCO) 10-325 MG tablet (Start on 02/24/2023)   HYDROcodone-acetaminophen (  NORCO) 10-325 MG tablet (Start on 03/25/2023)   Depression    Continue Cymbalta 60 mg daily.      Other Visit Diagnoses     Ataxia       ordered MRI,  referral placed to neurology   Relevant Orders   Ambulatory referral to Neurology   MR Brain Wo Contrast   Need for influenza vaccination       Relevant Orders   Flu Vaccine Trivalent High Dose  (Fluad) (Completed)          Follow up plan: Return in about 3 months (around 04/27/2023) for follow up.

## 2023-01-25 NOTE — Assessment & Plan Note (Signed)
Continue atorvastatin 10 mg daily. 

## 2023-01-25 NOTE — Assessment & Plan Note (Signed)
Reports he ran out of insulin about 2 weeks ago.  Will continue with Ozempic 1 mg weekly metformin 1000 mg twice daily, glipizide 10 mg daily and continue Toujeo 10 units daily.

## 2023-01-30 ENCOUNTER — Ambulatory Visit
Admission: RE | Admit: 2023-01-30 | Discharge: 2023-01-30 | Disposition: A | Discharge status: Home / Self Care | Payer: Medicare Other | Source: Ambulatory Visit | Attending: Nurse Practitioner | Admitting: Nurse Practitioner

## 2023-01-30 DIAGNOSIS — R270 Ataxia, unspecified: Secondary | ICD-10-CM | POA: Diagnosis not present

## 2023-01-30 DIAGNOSIS — K080 Exfoliation of teeth due to systemic causes: Secondary | ICD-10-CM | POA: Diagnosis not present

## 2023-01-30 DIAGNOSIS — Q012 Occipital encephalocele: Secondary | ICD-10-CM | POA: Diagnosis not present

## 2023-02-07 DIAGNOSIS — K080 Exfoliation of teeth due to systemic causes: Secondary | ICD-10-CM | POA: Diagnosis not present

## 2023-02-12 ENCOUNTER — Ambulatory Visit: Payer: Medicare Other | Admitting: Internal Medicine

## 2023-02-16 ENCOUNTER — Encounter: Payer: Self-pay | Admitting: Nurse Practitioner

## 2023-02-19 ENCOUNTER — Ambulatory Visit: Payer: Medicare Other

## 2023-02-19 ENCOUNTER — Encounter: Payer: Self-pay | Admitting: Nurse Practitioner

## 2023-02-19 ENCOUNTER — Ambulatory Visit: Payer: Medicare Other | Attending: Physician Assistant | Admitting: Physician Assistant

## 2023-02-19 ENCOUNTER — Encounter: Payer: Self-pay | Admitting: Physician Assistant

## 2023-02-19 VITALS — BP 170/96 | HR 84 | Ht 75.0 in | Wt 249.8 lb

## 2023-02-19 DIAGNOSIS — R5383 Other fatigue: Secondary | ICD-10-CM | POA: Diagnosis not present

## 2023-02-19 DIAGNOSIS — I152 Hypertension secondary to endocrine disorders: Secondary | ICD-10-CM

## 2023-02-19 DIAGNOSIS — R0609 Other forms of dyspnea: Secondary | ICD-10-CM

## 2023-02-19 DIAGNOSIS — R55 Syncope and collapse: Secondary | ICD-10-CM

## 2023-02-19 DIAGNOSIS — I700 Atherosclerosis of aorta: Secondary | ICD-10-CM

## 2023-02-19 DIAGNOSIS — N1831 Chronic kidney disease, stage 3a: Secondary | ICD-10-CM

## 2023-02-19 DIAGNOSIS — E785 Hyperlipidemia, unspecified: Secondary | ICD-10-CM

## 2023-02-19 MED ORDER — AMLODIPINE BESYLATE 5 MG PO TABS: 5.0000 mg | ORAL_TABLET | Freq: Every day | ORAL | 3 refills | Status: DC

## 2023-02-19 MED ORDER — VALSARTAN 40 MG PO TABS: 40.0000 mg | ORAL_TABLET | Freq: Every day | ORAL | 3 refills | Status: DC

## 2023-02-19 NOTE — Progress Notes (Signed)
Cardiology Office Note    Date:  02/19/2023   ID:  Evan Rivas, DOB 04/18/1950, MRN [id number]  PCP:  Berniece Salines, FNP  Cardiologist:  Yvonne Kendall, MD  Electrophysiologist:  None   Chief Complaint: Follow-up  History of Present Illness:   Evan Rivas is a 72 y.o. male with history of diastolic dysfunction, hyperaldosteronism secondary to adrenal hyperplasia, DM2, HTN, HLD, cancer of the lip, gout, anxiety, depression, and arthritis with chronic pain who presents for follow-up of myocardial PET/CT and echo.  Prior echo from 2019 showed an EF of 60 to 65%, mild LVH, no regional wall motion abnormalities, grade 2 diastolic dysfunction, mild mitral regurgitation, mildly dilated left atrium, and normal RV systolic function and ventricular cavity size.  Renal artery ultrasound in 03/2018 showed no evidence of RAS.  He was seen in the office in 09/2021 following a recent ED visit for near syncope that occurred having been out of town and not having had much to eat or drink.  ED evaluation was unremarkable.  No interventions were indicated at that time.  More recently, he was seen in our office on 01/03/2019 for noting a year-long history of progressive fatigue, weakness, and dyspnea, particularly with exertion.  These symptoms have significantly impacted his daily activities, ultimately leading to his cessation of golf.  He reported an episode of syncope while in [place], that occurred while seated, and had an unrevealing extensive workup.  He also reported an episode of syncope while playing golf, leaning over to pick up his ball on the green, and subsequently passing out.  He noted a significant increase in glucose levels around that time trending from 175 to greater than 300.  Blood pressure was soft at 94/68.  At that time, it was recommended he take half of a valsartan/HCTZ.  11 subsequent labs showed AKI and hyperkalemia with recommendation for ED evaluation.  He was evaluated in  the ED with repeat labs showing an improved potassium of 4.7 and improving serum creatinine.  HCTZ was subsequently discontinued with recommendation for patient to take valsartan 40 mg and patient reports metoprolol 50 mg.  He subsequently underwent myocardial PET/CT showed no evidence of ischemia or infarction with an EF of 65% and was overall low risk.  No evidence of coronary artery calcification on CT imaging.  Echo 110/22/2024 showed an EF of 60 to 65%, no regional wall motion abnormality, grade 1 diastolic dysfunction, normal RV systolic function and ventricular cavity size, no significant valvular abnormalities, and an estimated right atrial pressure of 3 mmHg.  He comes in today continuing to note exertional fatigue, weakness, and dyspnea without frank chest pain.  No presyncope or syncope.  He also notes a longstanding history of lower extremity swelling with the left lower extremity typically being worse than the right lower extremity.  He does try and elevate his legs.  He has previously briefly worn compression socks with noted improvement in lower extremity symptoms.  No progressive orthopnea or early satiety.  He feels that some of his dyspnea is related to physical inactivity.    Labs independently reviewed: 01/2023 - A1c 7.2, BUN 27, serum creatinine 1.3, potassium 4.9, albumin 3.7, AST/ALT normal, TSH normal 08/2022 - magnesium 1.8 07/2022 - TC 136, TG 232, HDL 37, LDL 70  Past Medical History:  Diagnosis Date   Adrenal hyperplasia (HCC)    Anxiety    panic attacks   Arthritis    Basal cell carcinoma 2015   R  upper lip - tx with MOHS by Dr. Adriana Simas   Basal cell carcinoma 08/25/2021   BCC, nasal tip, Mohs completed on 09/30/21   CKD (chronic kidney disease), stage III (HCC)    Depression    GERD (gastroesophageal reflux disease)    Gout    Grade II diastolic dysfunction    a.) TTE 02/19/2018 --> LVEF 60-65%, mild LVH, G2DD, mild MR, mildly dilated LA, RV cavity size/wall  thickness/systolic function normal   History of kidney stones    Hyperaldosteronism (HCC)    a.) secondary to BILATERAL adrenal hyperplasia   Hyperlipidemia    Hypertension    Nerve entrapment syndrome of lower extremity, right 10/24/2019   Pneumonia    2014 and 2018 ( 2018-klebsiella pneumonia    Sleep apnea    T2DM (type 2 diabetes mellitus) (HCC)     Past Surgical History:  Procedure Laterality Date   ARTHROSCOPIC REPAIR ACL Left    CIRCUMCISION N/A 01/17/2021   Procedure: CIRCUMCISION ADULT;  Surgeon: Vanna Scotland, MD;  Location: [facility];  Service: Urology;  Laterality: N/A;   CYSTOSCOPY W/ URETERAL STENT PLACEMENT Right 09/07/2014   Procedure: CYSTOSCOPY WITH STENT REPLACEMENT;  Surgeon: Vanna Scotland, MD;  Location: [facility];  Service: Urology;  Laterality: Right;   CYSTOSCOPY WITH INSERTION OF UROLIFT N/A 11/24/2019   Procedure: CYSTOSCOPY WITH INSERTION OF UROLIFT;  Surgeon: Vanna Scotland, MD;  Location: [facility];  Service: Urology;  Laterality: N/A;   CYSTOSCOPY/URETEROSCOPY/HOLMIUM LASER/STENT PLACEMENT Right 08/17/2014   Procedure: CYSTOSCOPY/URETEROSCOPY//STENT PLACEMENT/ attempt of lithotripsy;  Surgeon: Vanna Scotland, MD;  Location: [facility];  Service: Urology;  Laterality: Right;   HERNIA REPAIR     JOINT REPLACEMENT     Bilat knees and right hip   MOHS SURGERY     lip   TOTAL HIP ARTHROPLASTY Right 06/16/2020   Procedure: TOTAL HIP ARTHROPLASTY ANTERIOR APPROACH;  Surgeon: Ollen Gross, MD;  Location: [facility];  Service: Orthopedics;  Laterality: Right;    TOTAL KNEE ARTHROPLASTY Left 04/24/2016   Procedure: LEFT TOTAL KNEE ARTHROPLASTY;  Surgeon: Ollen Gross, MD;  Location: [facility];  Service: Orthopedics;  Laterality: Left;  Adductor Block   TOTAL KNEE ARTHROPLASTY Right 10/23/2016   Procedure: RIGHT TOTAL KNEE ARTHROPLASTY;  Surgeon: Ollen Gross, MD;  Location: [facility];  Service: Orthopedics;  Laterality: Right;   URETEROSCOPY WITH HOLMIUM LASER  LITHOTRIPSY Right 09/07/2014   Procedure: URETEROSCOPY WITH HOLMIUM LASER LITHOTRIPSY;  Surgeon: Vanna Scotland, MD;  Location: [facility];  Service: Urology;  Laterality: Right;   VASECTOMY      Current Medications: Current Meds  Medication Sig   amLODipine (NORVASC) 5 MG tablet Take 1 tablet (5 mg total) by mouth daily.   aspirin EC 81 MG tablet Take 81 mg by mouth daily. Swallow whole.   atorvastatin (LIPITOR) 40 MG tablet TAKE 1 TABLET AT BEDTIME   clotrimazole (LOTRIMIN) 1 % cream Apply 1 application topically 2 (two) times daily. Apply for 7-10 days.   dapagliflozin propanediol (FARXIGA) 10 MG TABS tablet Take 1 tablet (10 mg total) by mouth daily before breakfast. Patient receives via [place] Patient Assistance Program   DULoxetine (CYMBALTA) 60 MG capsule TAKE 1 CAPSULE DAILY (MUST ATTEND DECEMBER APPOINTMENT FOR FURTHER REFILLS)   fluticasone (FLONASE) 50 MCG/ACT nasal spray USE 2 SPRAYS IN EACH NOSTRIL DAILY   gabapentin (NEURONTIN) 600 MG tablet Take 1 tablet (600 mg total) by mouth 3 (three) times daily.   glipiZIDE (GLUCOTROL) 10 MG tablet Take 1 tablet (10  mg total) by mouth daily before breakfast.   glucose blood (TRUE METRIX BLOOD GLUCOSE TEST) test strip Test  blood sugar twice a day   HYDROcodone-acetaminophen (NORCO) 10-325 MG tablet Take 0.5-1 tablets by mouth every 8 (eight) hours as needed for severe pain (pain score 7-10). Gradually reducing dose   insulin glargine, 2 Unit Dial, (TOUJEO MAX SOLOSTAR) 300 UNIT/ML Solostar Pen Inject 10 Units into the skin daily.   ketoconazole (NIZORAL) 2 % cream Apply to the feet QHS.   melatonin 3 MG TABS tablet Take 3 mg by mouth at bedtime.   metFORMIN (GLUCOPHAGE) 1000 MG tablet TAKE 1 TABLET TWICE A DAY WITH A MEAL   metoprolol succinate (TOPROL-XL) 100 MG 24 hr tablet TAKE 1 TABLET DAILY WITH OR IMMEDIATELY FOLLOWING A MEAL (Patient taking differently: 50 mg daily.)   Multiple Vitamin (MULTIVITAMIN) tablet Take 1 tablet by mouth  daily.   Semaglutide (OZEMPIC, 1 MG/DOSE, [place]) Inject into the skin.   tiZANidine (ZANAFLEX) 4 MG tablet TAKE 1 TABLET EVERY 6 HOURS AS NEEDED FOR MUSCLE SPASMS   [DISCONTINUED] HYDROcodone-acetaminophen (NORCO) 10-325 MG tablet Take 0.5-1 tablets by mouth every 8 (eight) hours as needed for severe pain (pain score 7-10). Gradually reducing dose   [DISCONTINUED] HYDROcodone-acetaminophen (NORCO) 10-325 MG tablet Take 0.5-1 tablets by mouth every 8 (eight) hours as needed for severe pain (pain score 7-10). Gradually reducing dose   [DISCONTINUED] valsartan (DIOVAN) 80 MG tablet Take 1 tablet (80 mg total) by mouth daily.    Allergies:   Bee venom, Celebrex [celecoxib], Tetanus antitoxin, Tetanus toxoids, and Tetanus-diphtheria toxoids td   Social History   Socioeconomic History   Marital status: Married    Spouse name: Eber Jones   Number of children: 1   Years of education: Not on file   Highest education level: Some college, no degree  Occupational History   Occupation: retired  Tobacco Use   Smoking status: Never   Smokeless tobacco: Never  Vaping Use   Vaping status: Never Used  Substance and Sexual Activity   Alcohol use: No   Drug use: No   Sexual activity: Not Currently    Birth control/protection: None  Other Topics Concern   Not on file  Social History Narrative   Not on file   Social Determinants of Health   Financial Resource Strain: Low Risk  (01/23/2023)   Overall Financial Resource Strain (CARDIA)    Difficulty of Paying Living Expenses: Not very hard  Food Insecurity: No Food Insecurity (01/23/2023)   Hunger Vital Sign    Worried About Running Out of Food in the Last Year: Never true    Ran Out of Food in the Last Year: Never true  Transportation Needs: No Transportation Needs (01/23/2023)   PRAPARE - Administrator, Civil Service (Medical): No    Lack of Transportation (Non-Medical): No  Physical Activity: Inactive (01/23/2023)   Exercise Vital  Sign    Days of Exercise per Week: 0 days    Minutes of Exercise per Session: 30 min  Stress: No Stress Concern Present (01/23/2023)   [employer] of Occupational Health - Occupational Stress Questionnaire    Feeling of Stress : Only a little  Social Connections: Moderately Isolated (01/23/2023)   Social Connection and Isolation Panel [NHANES]    Frequency of Communication with Friends and Family: More than three times a week    Frequency of Social Gatherings with Friends and Family: Twice a week    Attends Religious Services:  Never    Active Member of Clubs or Organizations: No    Attends Banker Meetings: Never    Marital Status: Married     Family History:  The patient's family history includes Arthritis in his mother; Cancer in his mother; Depression in his sister; Heart disease in his father; Hyperlipidemia in his mother and sister; Hypertension in his mother and sister; Kidney disease in his maternal grandmother; Learning disabilities in his sister; Obesity in his father and sister; Stroke in his father.  ROS:   12-point review of systems is negative unless otherwise noted in the HPI.   EKGs/Labs/Other Studies Reviewed:    Studies reviewed were summarized above. The additional studies were reviewed today:   2D echo 02/2018: - Left ventricle: The cavity size was normal. Wall thickness was   increased in a pattern of mild LVH. Systolic function was normal.   The estimated ejection fraction was in the range of 60% to 65%.   Wall motion was normal; there were no regional wall motion   abnormalities. Features are consistent with a pseudonormal left   ventricular filling pattern, with concomitant abnormal relaxation   and increased filling pressure (grade 2 diastolic dysfunction). - Mitral valve: There was mild regurgitation. - Left atrium: The atrium was mildly dilated. - Right ventricle: The cavity size was normal. Wall thickness was   normal. Systolic  function was normal. __________   Renal artery ultrasound 03/2018: Summary: Largest Aortic Diameter: 2.0 cm   Renal:   Right: Normal size right kidney. Normal right Resisitive Index.        Abnormal cortical thickness of right kidney. No evidence of        right renal artery stenosis. RRV flow present. Left:  LRV flow present. No evidence of left renal artery stenosis.        Normal size of left kidney. Normal left Resistive Index.        Normal cortical thickness of the left kidney. Mesenteric: Normal Celiac artery and Superior Mesenteric artery findings. __________  Myocardial/CT 01/18/2023:   LV perfusion is normal. There is no evidence of ischemia. There is no evidence of infarction.   Stress left ventricular function is normal. Stress EF: 68%. Resting EF not accurate due to poor gating and not reported End diastolic cavity size is normal. End systolic cavity size is normal.   Myocardial blood flow was computed to be 0.25ml/g/min at rest and 2.36ml/g/min at stress. Global myocardial blood flow reserve was 2.45 and was normal.   Coronary calcium was absent on the attenuation correction CT images.   The study is normal. The study is low risk. ___________  2D echo 01/23/2023: 1. Left ventricular ejection fraction, by estimation, is 60 to 65%. The  left ventricle has normal function. The left ventricle has no regional  wall motion abnormalities. Left ventricular diastolic parameters are  consistent with Grade I diastolic  dysfunction (impaired relaxation).   2. Right ventricular systolic function is normal. The right ventricular  size is normal. Tricuspid regurgitation signal is inadequate for assessing  PA pressure.   3. The mitral valve is normal in structure. No evidence of mitral valve  regurgitation. No evidence of mitral stenosis.   4. The aortic valve is tricuspid. Aortic valve regurgitation is not  visualized. No aortic stenosis is present.   5. The inferior vena cava  is normal in size with greater than 50%  respiratory variability, suggesting right atrial pressure of 3 mmHg.    EKG:  EKG is not ordered today.    Recent Labs: 01/03/2023: Hemoglobin 15.0; Platelets 237; TSH 1.880 01/04/2023: ALT 29 01/16/2023: BUN 27; Creatinine, Ser 1.30; Potassium 4.9; Sodium 146  Recent Lipid Panel    Component Value Date/Time   CHOL 136 07/28/2022 1039   CHOL 140 09/17/2017 1014   TRIG 232 (H) 07/28/2022 1039   HDL 37 (L) 07/28/2022 1039   HDL 32 (L) 09/17/2017 1014   CHOLHDL 3.7 07/28/2022 1039   LDLCALC 70 07/28/2022 1039    PHYSICAL EXAM:    VS:  BP (!) 170/96 (BP Location: Left Arm, Patient Position: Sitting, Cuff Size: Normal)   Pulse 84   Ht 6\' 3"  (1.905 m)   Wt 249 lb 12.8 oz (113.3 kg)   SpO2 97%   BMI 31.22 kg/m   BMI: Body mass index is 31.22 kg/m.  Physical Exam Vitals reviewed.  Constitutional:      Appearance: He is well-developed.  HENT:     Head: Normocephalic and atraumatic.  Eyes:     General:        Right eye: No discharge.        Left eye: No discharge.  Neck:     Vascular: No JVD.  Cardiovascular:     Rate and Rhythm: Normal rate and regular rhythm.     Heart sounds: Normal heart sounds, S1 normal and S2 normal. Heart sounds not distant. No midsystolic click and no opening snap. No murmur heard.    No friction rub.  Pulmonary:     Effort: Pulmonary effort is normal. No respiratory distress.     Breath sounds: Normal breath sounds. No decreased breath sounds, wheezing, rhonchi or rales.  Chest:     Chest wall: No tenderness.  Abdominal:     General: There is no distension.  Musculoskeletal:     Cervical back: Normal range of motion.     Right lower leg: Edema present.     Left lower leg: Edema present.     Comments: Mild bilateral lower extremity swelling with the left being greater than the right.  Skin:    General: Skin is warm and dry.     Nails: There is no clubbing.  Neurological:     Mental Status: He is  alert and oriented to person, place, and time.  Psychiatric:        Speech: Speech normal.        Behavior: Behavior normal.        Thought Content: Thought content normal.        Judgment: Judgment normal.     Wt Readings from Last 3 Encounters:  02/19/23 249 lb 12.8 oz (113.3 kg)  01/25/23 254 lb 4.8 oz (115.3 kg)  01/18/23 256 lb (116.1 kg)     ASSESSMENT & PLAN:   History of syncope with associated fatigue and weakness: No further episodes.  Initial episode occurred in 08/2021 with most recent episode occurring in 10/2022, possibly in the setting of elevated blood glucose levels.  Myocardial PET/CT without evidence of ischemia or infarction and was overall low risk.  Echo without any significant structural abnormalities.  Placed 2-week ZIO AT.  If unrevealing plan to refer to EP for consideration of ILR implantation for further workup of syncope.  Exertional dyspnea: Myocardial PET/CT and echo unrevealing as outlined above.  Cannot exclude some degree of obesity and physical deconditioning.  HTN due to endocrine disorder: Blood pressure elevated in the office today and at home with readings largely in  the 150s to 160s with an occasional reading in the 90s to 200 mmHg systolic.  Defer escalation of valsartan or metoprolol at this time given recent AKI and hyperkalemia, and for concern of possible conduction abnormality, for which cardiac monitoring is being implemented.  For now, rechallenge patient with amlodipine 5 mg with close monitoring for progressive lower extremity swelling.  Would not escalate to 10 mg.  Avoid spironolactone with underlying renal dysfunction and prior hyperkalemia.  Atherosclerosis/HLD: Most recent LDL 70, remains on atorvastatin 40 mg.  CKD stage IIIa: Avoid nephrotoxic agents.  Follow-up with nephrology.     Disposition: F/u with Dr. Okey Dupre or an APP in 6 to 8 weeks.   Medication Adjustments/Labs and Tests Ordered: Current medicines are reviewed at length  with the patient today.  Concerns regarding medicines are outlined above. Medication changes, Labs and Tests ordered today are summarized above and listed in the Patient Instructions accessible in Encounters.   Signed, Eula Listen, PA-C 02/19/2023 5:22 PM     [facility] [place] [address] [place], [place] [address] [phone]

## 2023-02-19 NOTE — Patient Instructions (Signed)
Medication Instructions:  Your physician recommends the following medication changes.  START TAKING: Amlodipine 5 mg daily   *If you need a refill on your cardiac medications before your next appointment, please call your pharmacy*   Lab Work: None ordered   Testing/Procedures: ZIO AT Long term monitor-Live Telemetry  Your physician has requested you wear a ZIO patch monitor for 14 days.  This is a single patch monitor. [employer] supplies one patch monitor per enrollment. Additional stickers are not available.  Please do not apply patch if you will be having a Nuclear Stress Test, Echocardiogram, Cardiac CT, MRI, or Chest Xray during the period you would be wearing the monitor. The patch cannot be worn during these tests. You cannot remove and re-apply the ZIO AT patch monitor.  Your ZIO patch monitor will be mailed 3 day USPS to your address on file. It may take 3-5 days to receive your monitor after you have been enrolled.  Once you have received your monitor, please review the enclosed instructions. Your monitor has already been registered assigning a specific monitor serial # to you.   Billing and Patient Assistance Program information  Meredeth Ide has been supplied with any insurance information on record for billing. [employer] offers a sliding scale Patient Assistance Program for patients without insurance, or whose insurance does not completely cover the cost of the ZIO patch monitor. You must apply for the Patient Assistance Program to qualify for the discounted rate. To apply, call [employer] at [phone], select option 4, select option 2 , ask to apply for the Patient Assistance Program, (you can request an interpreter if needed). [employer] will ask your household income and how many people are in your household. [employer] will quote your out-of-pocket cost based on this information. They will also be able to set up a 12 month interest free payment plan if needed.  Applying the monitor    Shave hair from upper left chest.  Hold the abrader disc by orange tab. Rub the abrader in 40 strokes over left upper chest as indicated in your monitor instructions.  Clean area with 4 enclosed alcohol pads. Use all pads to ensure the area is cleaned thoroughly. Let dry.  Apply patch as indicated in monitor instructions. Patch will be placed under collarbone on left side of chest with arrow pointing upward.  Rub patch adhesive wings for 2 minutes. Remove the white label marked "1". Remove the white label marked "2". Rub patch adhesive wings for 2 additional minutes.  While looking in a mirror, press and release button in center of patch. A small green light will flash 3-4 times. This will be your only indicator that the monitor has been turned on.  Do not shower for the first 24 hours. You may shower after the first 24 hours.  Press the button if you feel a symptom. You will hear a small click. Record Date, Time and Symptom in the Patient Log.  Starting the Gateway  In your kit there is a Audiological scientist box the size of a cellphone. This is Buyer, retail. It transmits all your recorded data to [facility]. This box must always stay within 10 feet of you. Open the box and push the * button. There will be a light that blinks orange and then green a few times. When the light stops blinking, the Gateway is connected to the ZIO patch. Call [employer] at [phone] to confirm your monitor is transmitting.  Returning your monitor  Remove your patch and place it  inside the Gateway. In the lower half of the Gateway there is a white bag with prepaid postage on it. Place Gateway in bag and seal. Mail package back to [place] as soon as possible. your physician should have your final report approximately 7 days after you have mailed back your monitor. Call [facility] Customer Care at [phone] if you have questions regarding your ZIO AT patch monitor. Call them immediately if you see an orange light  blinking on your monitor.  If your monitor falls off in less than 4 days, contact our Monitor department at [phone]. If your monitor becomes loose or falls off after 4 days call [employer] at [phone] for suggestions on securing your monitor   Follow-Up: At [facility], you and your health needs are our priority.  As part of our continuing mission to provide you with exceptional heart care, we have created designated Provider Care Teams.  These Care Teams include your primary Cardiologist (physician) and Advanced Practice Providers (APPs -  Physician Assistants and Nurse Practitioners) who all work together to provide you with the care you need, when you need it.  We recommend signing up for the patient portal called "MyChart".  Sign up information is provided on this After Visit Summary.  MyChart is used to connect with patients for Virtual Visits (Telemedicine).  Patients are able to view lab/test results, encounter notes, upcoming appointments, etc.  Non-urgent messages can be sent to your provider as well.   To learn more about what you can do with MyChart, go to [email or web address].    Your next appointment:   4 to 6 week(s)  Provider:   You may see Yvonne Kendall, MD or one of the following Advanced Practice Providers on your designated Care Team:   Eula Listen, [place]

## 2023-02-19 NOTE — Progress Notes (Unsigned)
   There were no vitals taken for this visit.   Subjective:    Patient ID: Evan Rivas, male    DOB: 13-Mar-1951, 72 y.o.   MRN: [id number]  HPI: Evan Rivas is a 72 y.o. male  No chief complaint on file.  URINARY SYMPTOMS  Dysuria: {Blank single:19197::"yes","no","burning"} Urinary frequency: {Blank single:19197::"yes","no"} Urgency: {Blank single:19197::"yes","no"} Small volume voids: {Blank single:19197::"yes","no"} Symptom severity: {Blank single:19197::"yes","no"} Urinary incontinence: {Blank single:19197::"yes","no"} Foul odor: {Blank single:19197::"yes","no"} Hematuria: {Blank single:19197::"yes","no"} Abdominal pain: {Blank single:19197::"yes","no"} Back pain: {Blank single:19197::"yes","no"} Suprapubic pain/pressure: {Blank single:19197::"yes","no"} Flank pain: {Blank single:19197::"yes","no"} Fever:  {Blank multiple:19196::"yes","no","subjective","low grade"} Vomiting: {Blank single:19197::"yes","no"} Relief with cranberry juice: {Blank single:19197::"yes","no"} Relief with pyridium: {Blank single:19197::"yes","no"} Status: better/worse/stable Previous urinary tract infection: {Blank single:19197::"yes","no"} Recurrent urinary tract infection: {Blank single:19197::"yes","no"} Sexual activity: No sexually active/monogomous/practicing safe sex History of sexually transmitted disease: {Blank single:19197::"yes","no"} Penile discharge: {Blank single:19197::"yes","no"} Treatments attempted: {Blank multiple:19196::"none","antibiotics","pyridium","cranberry","increasing fluids"}    Relevant past medical, surgical, family and social history reviewed and updated as indicated. Interim medical history since our last visit reviewed. Allergies and medications reviewed and updated.  Review of Systems  Ten systems reviewed and is negative except as mentioned in HPI ***      Objective:    There were no vitals taken for this visit.  Wt Readings from Last 3  Encounters:  01/25/23 254 lb 4.8 oz (115.3 kg)  01/18/23 256 lb (116.1 kg)  01/04/23 241 lb 2.9 oz (109.4 kg)    Physical Exam  Constitutional: Patient appears well-developed and well-nourished. Obese *** No distress.  HEENT: head atraumatic, normocephalic, pupils equal and reactive to light, ears ***, neck supple, throat within normal limits Cardiovascular: Normal rate, regular rhythm and normal heart sounds.  No murmur heard. No BLE edema. Pulmonary/Chest: Effort normal and breath sounds normal. No respiratory distress. Abdominal: Soft.  There is no tenderness. Psychiatric: Patient has a normal mood and affect. behavior is normal. Judgment and thought content normal.  Results for orders placed or performed in visit on 01/25/23  POCT HgB A1C  Result Value Ref Range   Hemoglobin A1C 7.2 (A) 4.0 - 5.6 %   HbA1c POC (<> result, manual entry)     HbA1c, POC (prediabetic range)     HbA1c, POC (controlled diabetic range)        Assessment & Plan:   Problem List Items Addressed This Visit   None    Follow up plan: No follow-ups on file.

## 2023-02-20 ENCOUNTER — Encounter: Payer: Self-pay | Admitting: Nurse Practitioner

## 2023-02-20 ENCOUNTER — Ambulatory Visit (INDEPENDENT_AMBULATORY_CARE_PROVIDER_SITE_OTHER): Payer: Medicare Other | Admitting: Nurse Practitioner

## 2023-02-20 ENCOUNTER — Other Ambulatory Visit: Payer: Self-pay

## 2023-02-20 VITALS — BP 130/80 | HR 91 | Temp 98.1°F | Resp 14 | Ht 75.0 in | Wt 248.8 lb

## 2023-02-20 DIAGNOSIS — R350 Frequency of micturition: Secondary | ICD-10-CM | POA: Diagnosis not present

## 2023-02-20 DIAGNOSIS — Z712 Person consulting for explanation of examination or test findings: Secondary | ICD-10-CM | POA: Diagnosis not present

## 2023-02-20 DIAGNOSIS — E119 Type 2 diabetes mellitus without complications: Secondary | ICD-10-CM | POA: Diagnosis not present

## 2023-02-20 LAB — POCT URINALYSIS DIPSTICK
Bilirubin, UA: NEGATIVE
Glucose, UA: POSITIVE — AB
Ketones, UA: NEGATIVE
Nitrite, UA: POSITIVE
Protein, UA: POSITIVE — AB
Spec Grav, UA: 1.02 (ref 1.010–1.025)
Urobilinogen, UA: 0.2 U/dL
pH, UA: 5 (ref 5.0–8.0)

## 2023-02-20 MED ORDER — CEPHALEXIN 500 MG PO CAPS
500.0000 mg | ORAL_CAPSULE | Freq: Two times a day (BID) | ORAL | 0 refills | Status: AC
Start: 2023-02-20 — End: 2023-02-25

## 2023-02-20 NOTE — Assessment & Plan Note (Signed)
Due for microalbumin urine,  urine sent

## 2023-02-21 LAB — MICROALBUMIN / CREATININE URINE RATIO
Creatinine, Urine: 101 mg/dL (ref 20–320)
Microalb Creat Ratio: 517 mg/g{creat} — ABNORMAL HIGH (ref <30)
Microalb, Ur: 52.2 mg/dL

## 2023-02-22 ENCOUNTER — Other Ambulatory Visit: Payer: Self-pay | Admitting: Nurse Practitioner

## 2023-02-22 ENCOUNTER — Other Ambulatory Visit: Payer: Self-pay | Admitting: Pharmacy Technician

## 2023-02-22 DIAGNOSIS — R350 Frequency of micturition: Secondary | ICD-10-CM

## 2023-02-22 DIAGNOSIS — Z5986 Financial insecurity: Secondary | ICD-10-CM

## 2023-02-22 LAB — URINE CULTURE
MICRO NUMBER:: 15752261
SPECIMEN QUALITY:: ADEQUATE

## 2023-02-22 MED ORDER — NITROFURANTOIN MONOHYD MACRO 100 MG PO CAPS: 100.0000 mg | ORAL_CAPSULE | Freq: Two times a day (BID) | ORAL | 0 refills | Status: DC

## 2023-02-22 NOTE — Progress Notes (Signed)
Pharmacy Medication Assistance Program Note    02/22/2023  Patient ID: Evan Rivas, male   DOB: 10/30/1950, 72 y.o.   MRN: [id number]     02/22/2023  Outreach Medication One  Manufacturer Medication One [employer] [facility] Ozempic  Dose of Ozempic 4mg /42ml  Type of Radiographer, therapeutic Assistance  Date Application Received From Patient 02/07/2023  Application Items Received From Patient Application;Proof of Income;Other  Date Application Received From Provider 01/26/2023  Date Application Submitted to Manufacturer 02/21/2023  Method Application Sent to Manufacturer Fax       Signature   K[facility]  Office: [phone] Fax: [phone] Email: [email or web address]

## 2023-02-23 DIAGNOSIS — R55 Syncope and collapse: Secondary | ICD-10-CM

## 2023-02-24 DIAGNOSIS — R55 Syncope and collapse: Secondary | ICD-10-CM | POA: Diagnosis not present

## 2023-02-26 ENCOUNTER — Other Ambulatory Visit: Payer: Self-pay | Admitting: Pharmacist

## 2023-02-26 DIAGNOSIS — E1122 Type 2 diabetes mellitus with diabetic chronic kidney disease: Secondary | ICD-10-CM | POA: Diagnosis not present

## 2023-02-26 DIAGNOSIS — N1831 Chronic kidney disease, stage 3a: Secondary | ICD-10-CM | POA: Diagnosis not present

## 2023-02-26 DIAGNOSIS — N2581 Secondary hyperparathyroidism of renal origin: Secondary | ICD-10-CM | POA: Diagnosis not present

## 2023-02-26 DIAGNOSIS — Z87442 Personal history of urinary calculi: Secondary | ICD-10-CM | POA: Diagnosis not present

## 2023-02-26 DIAGNOSIS — I10 Essential (primary) hypertension: Secondary | ICD-10-CM | POA: Diagnosis not present

## 2023-02-26 NOTE — Progress Notes (Signed)
   02/26/2023  Patient ID: Evan Rivas, male   DOB: [birth year], 72 y.o.   MRN: [id number]  Receive a voicemail from patient requesting a call back about his application for re-enrollment in Ozempic patient assistance program from [employer] for 2025 calendar year. Per message, patient received notification from [employer] that his application is incomplete.  Outreach to [employer] patient assistance program on behalf of patient today. Speak with representative Sonia who advises patient's application is incomplete as program did not receive page 9 (states that program received 2 copies of page 8).  Representative requests office resend pages 7,8 and 9 to program for patient. Note patient's Novo [employer] Patient ID: [id number]  Provide update to patient today.  Will collaborate with CPhT Noreene Larsson [place] to ask that she resend pages of application to [employer] to program as requested.  Estelle Grumbles, PharmD, [facility] [phone]

## 2023-02-26 NOTE — Patient Instructions (Signed)
Goals Addressed             This Visit's Progress    Pharmacy Goals       If you need to call Noreene Larsson, you can reach her at [phone]   In the future, if you need to reach out to patient assistance programs regarding refills or to find out the status of your application, you can do so by calling:   [employer] at [phone] [place] at [phone]   Thank you!   Estelle Grumbles, PharmD, [facility] [phone]

## 2023-03-06 ENCOUNTER — Other Ambulatory Visit: Payer: Self-pay | Admitting: Pharmacy Technician

## 2023-03-06 DIAGNOSIS — Z5986 Financial insecurity: Secondary | ICD-10-CM

## 2023-03-06 NOTE — Progress Notes (Signed)
Pharmacy Medication Assistance Program Note    03/06/2023  Patient ID: Evan Rivas, male   DOB: 27-Jan-1951, 72 y.o.   MRN: [id number]     02/22/2023 03/06/2023  Outreach Medication One  Manufacturer Medication One [employer] [facility] Ozempic   Dose of Ozempic 4mg /63ml   Type of Sport and exercise psychologist   Date Application Received From Patient 02/07/2023   Application Items Received From Patient Application;Proof of Income;Other   Date Application Received From Provider 01/26/2023   Date Application Submitted to Manufacturer 02/21/2023   Method Application Sent to Manufacturer Fax   Patient Assistance Determination  Approved  Approval Start Date  04/04/2023  Approval End Date  04/02/2024  Patient Notification Method  Telephone Call  Telephone Call Outcome  Successful  Additional Outreach Contact  Provider  Contacted Provider  Message     Care coordination call placed to [employer] in regard to [place] application. Spoke to Nellie who informs patient is APPROVED 04/04/23-04/02/24. Medication will be shipped automatically to the prescriber's office based on last fill dates with the program. Patient may call [employer] at [phone] to check on refill status. Patient's id number with the program is [phone]. HIPAA compliant voicemail left information patient of approval.  Signature  Kristopher [facility]  Office: [phone] Fax: [phone] Email: [email or web address]

## 2023-03-19 ENCOUNTER — Encounter: Payer: Self-pay | Admitting: Physician Assistant

## 2023-03-19 ENCOUNTER — Ambulatory Visit: Payer: Medicare Other | Attending: Physician Assistant | Admitting: Physician Assistant

## 2023-03-19 VITALS — BP 115/66 | HR 96 | Ht 75.0 in | Wt 249.6 lb

## 2023-03-19 DIAGNOSIS — I152 Hypertension secondary to endocrine disorders: Secondary | ICD-10-CM

## 2023-03-19 DIAGNOSIS — R0609 Other forms of dyspnea: Secondary | ICD-10-CM

## 2023-03-19 DIAGNOSIS — E785 Hyperlipidemia, unspecified: Secondary | ICD-10-CM

## 2023-03-19 DIAGNOSIS — R55 Syncope and collapse: Secondary | ICD-10-CM

## 2023-03-19 DIAGNOSIS — N1831 Chronic kidney disease, stage 3a: Secondary | ICD-10-CM

## 2023-03-19 DIAGNOSIS — I440 Atrioventricular block, first degree: Secondary | ICD-10-CM | POA: Diagnosis not present

## 2023-03-19 DIAGNOSIS — I700 Atherosclerosis of aorta: Secondary | ICD-10-CM | POA: Diagnosis not present

## 2023-03-19 DIAGNOSIS — Z8639 Personal history of other endocrine, nutritional and metabolic disease: Secondary | ICD-10-CM

## 2023-03-19 DIAGNOSIS — G4733 Obstructive sleep apnea (adult) (pediatric): Secondary | ICD-10-CM

## 2023-03-19 MED ORDER — METOPROLOL SUCCINATE ER 25 MG PO TB24: 25.0000 mg | ORAL_TABLET | Freq: Every day | ORAL | 3 refills | Status: DC

## 2023-03-19 NOTE — Progress Notes (Signed)
Cardiology Office Note    Date:  03/19/2023   ID:  ROANAN BODLEY, DOB [birth year], MRN [id number]  PCP:  Berniece Salines, FNP  Cardiologist:  Yvonne Kendall, MD  Electrophysiologist:  None   Chief Complaint: Follow up  History of Present Illness:   Evan Rivas is a 72 y.o. male with history of diastolic dysfunction, hyperaldosteronism secondary to adrenal hyperplasia, DM2, HTN, HLD, cancer of the lip, gout, anxiety, depression, OSA intolerant to CPAP, and arthritis with chronic pain who presents for follow-up of Zio patch.   Prior echo from 2019 showed an EF of 60 to 65%, mild LVH, no regional wall motion abnormalities, grade 2 diastolic dysfunction, mild mitral regurgitation, mildly dilated left atrium, and normal RV systolic function and ventricular cavity size.  Renal artery ultrasound in 03/2018 showed no evidence of RAS.  He was seen in the office in 09/2021 following a recent ED visit for near syncope that occurred having been out of town and not having had much to eat or drink.  ED evaluation was unremarkable.  No interventions were indicated at that time.  More recently, he was seen in our office on 01/03/2019 for noting a year-long history of progressive fatigue, weakness, and dyspnea, particularly with exertion.  These symptoms have significantly impacted his daily activities, ultimately leading to his cessation of golf.  He reported an episode of syncope while in [place], that occurred while seated, and had an unrevealing extensive workup.  He also reported an episode of syncope while playing golf, leaning over to pick up his ball on the green, and subsequently passing out.  He noted a significant increase in glucose levels around that time trending from 175 to greater than 300.  Blood pressure was soft at 94/68.  At that time, it was recommended he take half of a valsartan/HCTZ.  11 subsequent labs showed AKI and hyperkalemia with recommendation for ED evaluation.  He was  evaluated in the ED with repeat labs showing an improved potassium of 4.7 and improving serum creatinine.  HCTZ was subsequently discontinued with recommendation for patient to take valsartan 40 mg and patient reports metoprolol 50 mg.   He subsequently underwent myocardial PET/CT showed no evidence of ischemia or infarction with an EF of 65% and was overall low risk.  No evidence of coronary artery calcification on CT imaging.  Echo 110/22/2024 showed an EF of 60 to 65%, no regional wall motion abnormality, grade 1 diastolic dysfunction, normal RV systolic function and ventricular cavity size, no significant valvular abnormalities, and an estimated right atrial pressure of 3 mmHg.  He was last seen in the office on 02/19/2023 continuing to note exertional fatigue, weakness, and dyspnea without frank chest pain.  No presyncope or syncope.  He also reported a longstanding history of lower extremity swelling with the left lower extremity typically being more noticeable than the right.  Previously noted improvement in swelling with compression socks and leg elevation.  Zio patch was recommended to further evaluate ongoing symptoms.  Blood pressure was elevated in the office and at home with recommendation to rechallenge with amlodipine 5 mg.   He comes in today and is without symptoms of angina or cardiac decompensation.  No dizziness, presyncope, or syncope.  At his nephrology appointment on 02/26/2023 he was started on torsemide 20 mg 3 times per week.  He reports self starting spironolactone 25 mg twice daily 4 days/week approximately 6 weeks ago.  He reports he was taking spironolactone when labs  were drawn at his nephrologist office 12/27/2022.  Lower extremity swelling improved.  Blood pressure well-controlled at home.  No palpitations.  Continues to deal with low back pain as his main limiting factor at this time.   Labs independently reviewed: 02/2023 - BUN 27, serum creatinine 1.27, potassium 4.7,  albumin 3.8, magnesium 2.2 01/2023 - A1c 7.2, AST/ALT normal, TSH normal 07/2022 - TC 136, TG 232, HDL 37, LDL 70  Past Medical History:  Diagnosis Date   Adrenal hyperplasia (HCC)    Anxiety    panic attacks   Arthritis    Basal cell carcinoma 2015   R upper lip - tx with MOHS by Dr. Adriana Simas   Basal cell carcinoma 08/25/2021   BCC, nasal tip, Mohs completed on 09/30/21   CKD (chronic kidney disease), stage III (HCC)    Depression    GERD (gastroesophageal reflux disease)    Gout    Grade II diastolic dysfunction    a.) TTE 02/19/2018 --> LVEF 60-65%, mild LVH, G2DD, mild MR, mildly dilated LA, RV cavity size/wall thickness/systolic function normal   History of kidney stones    Hyperaldosteronism (HCC)    a.) secondary to BILATERAL adrenal hyperplasia   Hyperlipidemia    Hypertension    Nerve entrapment syndrome of lower extremity, right 10/24/2019   Pneumonia    2014 and 2018 ( 2018-klebsiella pneumonia    Sleep apnea    T2DM (type 2 diabetes mellitus) (HCC)     Past Surgical History:  Procedure Laterality Date   ARTHROSCOPIC REPAIR ACL Left    CIRCUMCISION N/A 01/17/2021   Procedure: CIRCUMCISION ADULT;  Surgeon: Vanna Scotland, MD;  Location: [facility];  Service: Urology;  Laterality: N/A;   CYSTOSCOPY W/ URETERAL STENT PLACEMENT Right 09/07/2014   Procedure: CYSTOSCOPY WITH STENT REPLACEMENT;  Surgeon: Vanna Scotland, MD;  Location: [facility];  Service: Urology;  Laterality: Right;   CYSTOSCOPY WITH INSERTION OF UROLIFT N/A 11/24/2019   Procedure: CYSTOSCOPY WITH INSERTION OF UROLIFT;  Surgeon: Vanna Scotland, MD;  Location: [facility];  Service: Urology;  Laterality: N/A;   CYSTOSCOPY/URETEROSCOPY/HOLMIUM LASER/STENT PLACEMENT Right 08/17/2014   Procedure: CYSTOSCOPY/URETEROSCOPY//STENT PLACEMENT/ attempt of lithotripsy;  Surgeon: Vanna Scotland, MD;  Location: [facility];  Service: Urology;  Laterality: Right;   HERNIA REPAIR     JOINT REPLACEMENT     Bilat knees and right  hip   MOHS SURGERY     lip   TOTAL HIP ARTHROPLASTY Right 06/16/2020   Procedure: TOTAL HIP ARTHROPLASTY ANTERIOR APPROACH;  Surgeon: Ollen Gross, MD;  Location: [facility];  Service: Orthopedics;  Laterality: Right;    TOTAL KNEE ARTHROPLASTY Left 04/24/2016   Procedure: LEFT TOTAL KNEE ARTHROPLASTY;  Surgeon: Ollen Gross, MD;  Location: [facility];  Service: Orthopedics;  Laterality: Left;  Adductor Block   TOTAL KNEE ARTHROPLASTY Right 10/23/2016   Procedure: RIGHT TOTAL KNEE ARTHROPLASTY;  Surgeon: Ollen Gross, MD;  Location: [facility];  Service: Orthopedics;  Laterality: Right;   URETEROSCOPY WITH HOLMIUM LASER LITHOTRIPSY Right 09/07/2014   Procedure: URETEROSCOPY WITH HOLMIUM LASER LITHOTRIPSY;  Surgeon: Vanna Scotland, MD;  Location: [facility];  Service: Urology;  Laterality: Right;   VASECTOMY      Current Medications: Current Meds  Medication Sig   amLODipine (NORVASC) 5 MG tablet Take 1 tablet (5 mg total) by mouth daily.   aspirin EC 81 MG tablet Take 81 mg by mouth daily. Swallow whole.   atorvastatin (LIPITOR) 40 MG tablet TAKE 1 TABLET AT BEDTIME  clotrimazole (LOTRIMIN) 1 % cream Apply 1 application topically 2 (two) times daily. Apply for 7-10 days.   dapagliflozin propanediol (FARXIGA) 10 MG TABS tablet Take 1 tablet (10 mg total) by mouth daily before breakfast. Patient receives via [place] Patient Assistance Program   DULoxetine (CYMBALTA) 60 MG capsule TAKE 1 CAPSULE DAILY (MUST ATTEND DECEMBER APPOINTMENT FOR FURTHER REFILLS)   fluticasone (FLONASE) 50 MCG/ACT nasal spray USE 2 SPRAYS IN EACH NOSTRIL DAILY   gabapentin (NEURONTIN) 600 MG tablet Take 1 tablet (600 mg total) by mouth 3 (three) times daily.   glipiZIDE (GLUCOTROL) 10 MG tablet Take 1 tablet (10 mg total) by mouth daily before breakfast.   glucose blood (TRUE METRIX BLOOD GLUCOSE TEST) test strip Test  blood sugar twice a day   HYDROcodone-acetaminophen (NORCO) 10-325 MG tablet Take 0.5-1 tablets by  mouth every 8 (eight) hours as needed for severe pain (pain score 7-10). Gradually reducing dose   insulin glargine, 2 Unit Dial, (TOUJEO MAX SOLOSTAR) 300 UNIT/ML Solostar Pen Inject 10 Units into the skin daily.   ketoconazole (NIZORAL) 2 % cream Apply to the feet QHS.   melatonin 3 MG TABS tablet Take 3 mg by mouth at bedtime.   metFORMIN (GLUCOPHAGE) 1000 MG tablet TAKE 1 TABLET TWICE A DAY WITH A MEAL   Multiple Vitamin (MULTIVITAMIN) tablet Take 1 tablet by mouth daily.   nitrofurantoin, macrocrystal-monohydrate, (MACROBID) 100 MG capsule Take 1 capsule (100 mg total) by mouth 2 (two) times daily.   Semaglutide (OZEMPIC, 1 MG/DOSE, [place]) Inject into the skin.   sildenafil (REVATIO) 20 MG tablet Take 1 tablet (20 mg total) by mouth as needed. Take 1-5 tabs as needed prior to intercourse   tiZANidine (ZANAFLEX) 4 MG tablet TAKE 1 TABLET EVERY 6 HOURS AS NEEDED FOR MUSCLE SPASMS   torsemide (DEMADEX) 20 MG tablet Take 20 mg by mouth 3 (three) times a week.   valsartan (DIOVAN) 40 MG tablet Take 1 tablet (40 mg total) by mouth daily.   [DISCONTINUED] metoprolol succinate (TOPROL-XL) 100 MG 24 hr tablet TAKE 1 TABLET DAILY WITH OR IMMEDIATELY FOLLOWING A MEAL (Patient taking differently: 50 mg daily.)   [DISCONTINUED] spironolactone (ALDACTONE) 25 MG tablet Take 25 mg by mouth 2 (two) times daily. Taking 4 times weekly    Allergies:   Bee venom, Celebrex [celecoxib], Tetanus antitoxin, Tetanus toxoids, and Tetanus-diphtheria toxoids td   Social History   Socioeconomic History   Marital status: Married    Spouse name: Eber Jones   Number of children: 1   Years of education: Not on file   Highest education level: Some college, no degree  Occupational History   Occupation: retired  Tobacco Use   Smoking status: Never   Smokeless tobacco: Never  Vaping Use   Vaping status: Never Used  Substance and Sexual Activity   Alcohol use: No   Drug use: No   Sexual activity: Not Currently    Birth  control/protection: None  Other Topics Concern   Not on file  Social History Narrative   Not on file   Social Drivers of Health   Financial Resource Strain: Low Risk  (01/23/2023)   Overall Financial Resource Strain (CARDIA)    Difficulty of Paying Living Expenses: Not very hard  Food Insecurity: No Food Insecurity (01/23/2023)   Hunger Vital Sign    Worried About Running Out of Food in the Last Year: Never true    Ran Out of Food in the Last Year: Never true  Transportation  Needs: No Transportation Needs (01/23/2023)   PRAPARE - Administrator, Civil Service (Medical): No    Lack of Transportation (Non-Medical): No  Physical Activity: Inactive (01/23/2023)   Exercise Vital Sign    Days of Exercise per Week: 0 days    Minutes of Exercise per Session: 30 min  Stress: No Stress Concern Present (01/23/2023)   [employer] of Occupational Health - Occupational Stress Questionnaire    Feeling of Stress : Only a little  Social Connections: Moderately Isolated (01/23/2023)   Social Connection and Isolation Panel [NHANES]    Frequency of Communication with Friends and Family: More than three times a week    Frequency of Social Gatherings with Friends and Family: Twice a week    Attends Religious Services: Never    Database administrator or Organizations: No    Attends Engineer, structural: Not on file    Marital Status: Married     Family History:  The patient's family history includes Arthritis in his mother; Cancer in his mother; Depression in his sister; Heart disease in his father; Hyperlipidemia in his mother and sister; Hypertension in his mother and sister; Kidney disease in his maternal grandmother; Learning disabilities in his sister; Obesity in his father and sister; Stroke in his father.  ROS:   12-point review of systems is negative unless otherwise noted in the HPI.   EKGs/Labs/Other Studies Reviewed:    Studies reviewed were summarized  above. The additional studies were reviewed today:  2D echo 02/2018: - Left ventricle: The cavity size was normal. Wall thickness was   increased in a pattern of mild LVH. Systolic function was normal.   The estimated ejection fraction was in the range of 60% to 65%.   Wall motion was normal; there were no regional wall motion   abnormalities. Features are consistent with a pseudonormal left   ventricular filling pattern, with concomitant abnormal relaxation   and increased filling pressure (grade 2 diastolic dysfunction). - Mitral valve: There was mild regurgitation. - Left atrium: The atrium was mildly dilated. - Right ventricle: The cavity size was normal. Wall thickness was   normal. Systolic function was normal. __________   Renal artery ultrasound 03/2018: Summary: Largest Aortic Diameter: 2.0 cm   Renal:   Right: Normal size right kidney. Normal right Resisitive Index.        Abnormal cortical thickness of right kidney. No evidence of        right renal artery stenosis. RRV flow present. Left:  LRV flow present. No evidence of left renal artery stenosis.        Normal size of left kidney. Normal left Resistive Index.        Normal cortical thickness of the left kidney. Mesenteric: Normal Celiac artery and Superior Mesenteric artery findings. __________   Myocardial/CT 01/18/2023:   LV perfusion is normal. There is no evidence of ischemia. There is no evidence of infarction.   Stress left ventricular function is normal. Stress EF: 68%. Resting EF not accurate due to poor gating and not reported End diastolic cavity size is normal. End systolic cavity size is normal.   Myocardial blood flow was computed to be 0.61ml/g/min at rest and 2.36ml/g/min at stress. Global myocardial blood flow reserve was 2.45 and was normal.   Coronary calcium was absent on the attenuation correction CT images.   The study is normal. The study is low risk. ___________   2D echo 01/23/2023: 1.  Left ventricular  ejection fraction, by estimation, is 60 to 65%. The  left ventricle has normal function. The left ventricle has no regional  wall motion abnormalities. Left ventricular diastolic parameters are  consistent with Grade I diastolic  dysfunction (impaired relaxation).   2. Right ventricular systolic function is normal. The right ventricular  size is normal. Tricuspid regurgitation signal is inadequate for assessing  PA pressure.   3. The mitral valve is normal in structure. No evidence of mitral valve  regurgitation. No evidence of mitral stenosis.   4. The aortic valve is tricuspid. Aortic valve regurgitation is not  visualized. No aortic stenosis is present.   5. The inferior vena cava is normal in size with greater than 50%  respiratory variability, suggesting right atrial pressure of 3 mmHg.  __________  Luci Bank patch 02/2023: Pending   EKG:  EKG is ordered today.  The EKG ordered today demonstrates NSR, 95 bpm, first-degree AV block, no acute ST-T changes  Recent Labs: 01/03/2023: Hemoglobin 15.0; Platelets 237; TSH 1.880 01/04/2023: ALT 29 01/16/2023: BUN 27; Creatinine, Ser 1.30; Potassium 4.9; Sodium 146  Recent Lipid Panel    Component Value Date/Time   CHOL 136 07/28/2022 1039   CHOL 140 09/17/2017 1014   TRIG 232 (H) 07/28/2022 1039   HDL 37 (L) 07/28/2022 1039   HDL 32 (L) 09/17/2017 1014   CHOLHDL 3.7 07/28/2022 1039   LDLCALC 70 07/28/2022 1039    PHYSICAL EXAM:    VS:  BP 115/66 (BP Location: Left Arm, Patient Position: Sitting)   Pulse 96   Ht 6\' 3"  (1.905 m)   Wt 249 lb 9.6 oz (113.2 kg)   SpO2 99%   BMI 31.20 kg/m   BMI: Body mass index is 31.2 kg/m.  Physical Exam Vitals reviewed.  Constitutional:      Appearance: He is well-developed.  HENT:     Head: Normocephalic and atraumatic.  Eyes:     General:        Right eye: No discharge.        Left eye: No discharge.  Neck:     Vascular: No JVD.  Cardiovascular:     Rate and Rhythm:  Normal rate and regular rhythm.     Heart sounds: Normal heart sounds, S1 normal and S2 normal. Heart sounds not distant. No midsystolic click and no opening snap. No murmur heard.    No friction rub.  Pulmonary:     Effort: Pulmonary effort is normal. No respiratory distress.     Breath sounds: Normal breath sounds. No decreased breath sounds, wheezing, rhonchi or rales.  Chest:     Chest wall: No tenderness.  Abdominal:     General: There is no distension.  Musculoskeletal:     Cervical back: Normal range of motion.     Comments: Trivial bilateral pretibial edema.  Skin:    General: Skin is warm and dry.     Nails: There is no clubbing.  Neurological:     Mental Status: He is alert and oriented to person, place, and time.  Psychiatric:        Speech: Speech normal.        Behavior: Behavior normal.        Thought Content: Thought content normal.        Judgment: Judgment normal.     Wt Readings from Last 3 Encounters:  03/19/23 249 lb 9.6 oz (113.2 kg)  02/20/23 248 lb 12.8 oz (112.9 kg)  02/19/23 249 lb 12.8 oz (113.3  kg)     ASSESSMENT & PLAN:   History of syncope with associated fatigue and weakness: No further episodes.  Preliminary Zio patch reviewed with the EP, without significant arrhythmia or prolonged pauses with nocturnal AV block, likely in the setting of untreated OSA.  No further testing indicated.  Reduce metoprolol as outlined below.  Echo without significant structural abnormalities.  EKG with stable first-degree AV block in the office today.  Etiology felt to be less likely arrhythmogenic per EP.  First degree AV block: Stable.  Reduce metoprolol from 50 mg daily to 25 mg daily.  Exertional dyspnea: Symptoms overall stable.  Main limiting factor at this time is chronic back pain.  Myocardial PET/CT and echo unrevealing as outlined above.  Cannot exclude some degree of obesity and physical deconditioning.  HTN due to endocrine disorder: Blood pressure is  well-controlled in the office this morning.  Continue amlodipine 5 mg, valsartan 40 mg, torsemide 20 mg 3 times weekly, and reduced dose Toprol-XL 25 mg.  Aortic atherosclerosis/HLD: Most recent LDL 70.  Remains on atorvastatin 40 mg.  CKD stage IIIa with history of hyperkalemia: Avoid nephrotoxic agents.  Discontinue self-started spironolactone.  Risks of medication discussed in detail up to and including death.  Follow-up with nephrology.   OSA: Intolerant to CPAP.    Disposition: F/u with Dr. Okey Dupre or an APP in 3 months.   Medication Adjustments/Labs and Tests Ordered: Current medicines are reviewed at length with the patient today.  Concerns regarding medicines are outlined above. Medication changes, Labs and Tests ordered today are summarized above and listed in the Patient Instructions accessible in Encounters.   Signed, Eula Listen, PA-C 03/19/2023 12:11 PM     [facility] [place] [address] [place], [place] [address] [phone]

## 2023-03-19 NOTE — Patient Instructions (Signed)
Medication Instructions:  Your physician recommends the following medication changes.  STOP TAKING: Spironolactone  DECREASE: Toprol-XL 25 mg daily  *If you need a refill on your cardiac medications before your next appointment, please call your pharmacy*   Lab Work: None ordered at this time    Follow-Up: At [facility], you and your health needs are our priority.  As part of our continuing mission to provide you with exceptional heart care, we have created designated Provider Care Teams.  These Care Teams include your primary Cardiologist (physician) and Advanced Practice Providers (APPs -  Physician Assistants and Nurse Practitioners) who all work together to provide you with the care you need, when you need it.  We recommend signing up for the patient portal called "MyChart".  Sign up information is provided on this After Visit Summary.  MyChart is used to connect with patients for Virtual Visits (Telemedicine).  Patients are able to view lab/test results, encounter notes, upcoming appointments, etc.  Non-urgent messages can be sent to your provider as well.   To learn more about what you can do with MyChart, go to [email or web address].    Your next appointment:   3 month(s)  Provider:   You may see Yvonne Kendall, MD or one of the following Advanced Practice Providers on your designated Care Team:   Eula Listen, [place]

## 2023-03-20 ENCOUNTER — Ambulatory Visit: Payer: Medicare Other

## 2023-04-06 ENCOUNTER — Other Ambulatory Visit: Payer: Self-pay | Admitting: Nurse Practitioner

## 2023-04-06 DIAGNOSIS — F1120 Opioid dependence, uncomplicated: Secondary | ICD-10-CM

## 2023-04-06 DIAGNOSIS — G8929 Other chronic pain: Secondary | ICD-10-CM

## 2023-04-06 DIAGNOSIS — M545 Low back pain, unspecified: Secondary | ICD-10-CM

## 2023-04-06 DIAGNOSIS — M51369 Other intervertebral disc degeneration, lumbar region without mention of lumbar back pain or lower extremity pain: Secondary | ICD-10-CM

## 2023-04-06 NOTE — Telephone Encounter (Signed)
 Medication Refill -  Most Recent Primary Care Visit:  Provider: PENDER, JULIE F  Department: [facility]  Visit Type: OFFICE VISIT  Date: 02/20/2023  Medication: gabapentin  (NEURONTIN ) 600 MG tablet   Has the patient contacted their pharmacy? No, because controlled substance  Is this the correct pharmacy for this prescription? yes  This is the patient's preferred pharmacy:  [facility] - [place], [place] - [address] [place] [address] Phone: [phone] Fax: [phone]   Has the prescription been filled recently? no  Is the patient out of the medication? yes  Has the patient been seen for an appointment in the last year OR does the patient have an upcoming appointment? yes  Can we respond through MyChart? yes  Agent: Please be advised that Rx refills may take up to 3 business days. We ask that you follow-up with your pharmacy.

## 2023-04-09 MED ORDER — GABAPENTIN 600 MG PO TABS: 600.0000 mg | ORAL_TABLET | Freq: Three times a day (TID) | ORAL | 1 refills | Status: DC

## 2023-04-09 NOTE — Telephone Encounter (Signed)
 Requested Prescriptions  Pending Prescriptions Disp Refills   gabapentin  (NEURONTIN ) 600 MG tablet 270 tablet 1    Sig: Take 1 tablet (600 mg total) by mouth 3 (three) times daily.     Neurology: Anticonvulsants - gabapentin  Failed - 04/09/2023  5:52 PM      Failed - Cr in normal range and within 360 days    Creat  Date Value Ref Range Status  07/28/2022 1.46 (H) 0.70 - 1.28 mg/dL Final   Creatinine, Ser  Date Value Ref Range Status  01/16/2023 1.30 (H) 0.76 - 1.27 mg/dL Final   Creatinine, Urine  Date Value Ref Range Status  02/20/2023 101 20 - 320 mg/dL Final         Passed - Completed PHQ-2 or PHQ-9 in the last 360 days      Passed - Valid encounter within last 12 months    Recent Outpatient Visits           1 month ago Urinary frequency   [facility] Gareth Mliss FALCON, FNP   2 months ago Essential hypertension   [facility] Gareth Mliss FALCON, FNP   3 months ago COVID-19   [facility] Gareth Mliss F, FNP   5 months ago Type 2 diabetes mellitus without complication, without long-term current use of insulin  [facility])   [facility] Gareth Mliss FALCON, FNP   8 months ago DDD (degenerative disc disease), lumbar   [facility] Gareth Mliss FALCON, FNP       Future Appointments             In 1 week  [facility]   In 3 weeks Gareth, Mliss FALCON, FNP [facility], [facility]   In 2 months Dunn, Bernardino HERO, PA-C [name]   In 4 months Hester Alm BROCKS, MD [facility]

## 2023-04-11 ENCOUNTER — Other Ambulatory Visit: Payer: Self-pay | Admitting: Pharmacist

## 2023-04-11 DIAGNOSIS — Z5986 Financial insecurity: Secondary | ICD-10-CM

## 2023-04-11 DIAGNOSIS — E1165 Type 2 diabetes mellitus with hyperglycemia: Secondary | ICD-10-CM

## 2023-04-11 NOTE — Progress Notes (Signed)
 04/11/2023 Name: Evan Rivas MRN: [id number] DOB: Aug 02, 1950  Chief Complaint  Patient presents with   Medication Assistance   Medication Management    Evan Rivas is a 73 y.o. year old male who presented for a telephone visit.   They were referred to the pharmacist by their PCP for assistance in managing diabetes and medication access.      Subjective:   Care Team: Primary Care Provider: Gareth Mliss FALCON, FNP ; Next Scheduled Visit: 04/30/2023 Cardiologist: Mady Bruckner, MD ; Next Scheduled Visit: 06/19/2023 Nephrologist: Dennise Capri, MD; Next Scheduled Visit: 05/29/2023  Medication Access/Adherence  Current Pharmacy:  [facility] [address] [facility] - [place], [place] - [address] AT [facility] [address] [place] [address] Phone: [phone] Fax: [phone]  [facility] - [place], [place] - [address] [place] [address] Phone: [phone] Fax: [phone]  [facility] - [place], [place] - [address] [place] [address] Phone: [phone] Fax: [phone]   Patient reports affordability concerns with their medications: No  Patient reports access/transportation concerns to their pharmacy: No  Patient reports adherence concerns with their medications:  No     Reports currently getting over a cold (cough, fever, congestion), but getting better now just with cough - Confirms will call office to follow up with PCP if symptoms not continuing to improve/has new symptoms   Using weekly pillbox; denies missed doses  Diabetes:   Current medications:  - Farxiga  10 mg daily before breakfast - Ozempic 1 mg weekly - glipizide  10 mg daily 30 minutes before breakfast  - metformin  1000 mg twice daily - Toujeo  10 units daily   Previous therapies tried: Trulicity  (switched to Ozempic)   Current glucose readings: today before breakfast: 167 - Reports reading  have been elevated while sick, but before sick fasting morning blood sugar ranging 130-150  Patient denies hypoglycemic s/sx including dizziness, shakiness, sweating.       Current physical activity: limited recently by recent illness, but plans to gradually get back to gym/riding bike when able   Statin therapy: atorvastatin  40 mg daily   Current medication access support:  - Enrolled in patient assistance for Farxiga  through [place] through 04/02/2024  Re-enrollment completed by patient via Writer - Enrolled in patient assistance for Ozempic through [employer] through 04/02/2024     Hypertension:   Patient followed by [facility] - From review of chart, note patient seen for Office Visit with Cardiologist on 03/19/2023. Provider advised patient to:  Reduce metoprolol  from 50 mg daily to 25 mg daily  Continue amlodipine  5 mg, valsartan  40 mg, torsemide  20 mg 3 times weekly, and reduced dose Toprol -XL 25 mg    Discontinue self-started spironolactone    Current medications:  - amlodipine  5 mg daily - metoprolol  ER 25 mg daily - torsemide  20 mg three times weekly - valsartan  80 mg daily (Note Cardiologist decreased dose to valsartan  40 mg daily on 02/19/2023, but patient denies having received this new dose)   Medications previously tried: spironolactone  (CKD, hyperkalemia), valsartan -HCTZ   Patient has an automated, upper arm home BP cuff Current blood pressure readings readings: yesterday: 133/84, HR 88    Patient denies hypotension, such as dizziness or lightheadedness    Current physical activity: reports currently limited while completing testing from Cardiology     Objective:  Lab Results  Component Value Date  HGBA1C 7.2 (A) 01/25/2023    Lab Results  Component Value Date   CREATININE 1.30 (H) 01/16/2023   BUN 27 01/16/2023   NA 146 (H) 01/16/2023   K 4.9 01/16/2023   CL 108 (H) 01/16/2023   CO2 24 01/16/2023    Lab Results  Component  Value Date   CHOL 136 07/28/2022   HDL 37 (L) 07/28/2022   LDLCALC 70 07/28/2022   TRIG 232 (H) 07/28/2022   CHOLHDL 3.7 07/28/2022   BP Readings from Last 3 Encounters:  03/19/23 115/66  02/20/23 130/80  02/19/23 (!) 170/96   Pulse Readings from Last 3 Encounters:  03/19/23 96  02/20/23 91  02/19/23 84     Medications Reviewed Today     Reviewed by Alana Sharyle LABOR, RPH-CPP (Pharmacist) on 04/11/23 at 1324  Med List Status: <None>   Medication Order Taking? Sig Documenting Provider Last Dose Status Informant  amLODipine  (NORVASC ) 5 MG tablet [id number] Yes Take 1 tablet (5 mg total) by mouth daily. Abigail Bernardino HERO, PA-C Taking Active   aspirin  EC 81 MG tablet [id number]  Take 81 mg by mouth daily. Swallow whole. [provider]  Active Self  atorvastatin  (LIPITOR) 40 MG tablet [id number]  TAKE 1 TABLET AT BEDTIME Bernardo Sharyle, DO  Active   clotrimazole  (LOTRIMIN ) 1 % cream [id number]  Apply 1 application topically 2 (two) times daily. Apply for 7-10 days. Vaillancourt, Samantha, PA-C  Active   dapagliflozin  propanediol (FARXIGA ) 10 MG TABS tablet [id number] Yes Take 1 tablet (10 mg total) by mouth daily before breakfast. Patient receives via [employer] Patient Assistance Program Gareth Clarity F, [place] Taking Active   DULoxetine  (CYMBALTA ) 60 MG capsule [id number]  TAKE 1 CAPSULE DAILY (MUST ATTEND DECEMBER APPOINTMENT FOR FURTHER REFILLS) Gareth Clarity FALCON, FNP  Active   fluticasone  (FLONASE ) 50 MCG/ACT nasal spray [id number]  USE 2 SPRAYS IN EACH NOSTRIL DAILY Pender, Julie F, FNP  Active   gabapentin  (NEURONTIN ) 600 MG tablet [id number]  Take 1 tablet (600 mg total) by mouth 3 (three) times daily. Gareth Clarity FALCON, FNP  Active   glipiZIDE  (GLUCOTROL ) 10 MG tablet [id number] Yes Take 1 tablet (10 mg total) by mouth daily before breakfast. Pender, Julie F, FNP Taking Active   glucose blood (TRUE METRIX BLOOD GLUCOSE TEST) test strip [id number]  Test  blood sugar twice a day Pender, Julie  F, FNP  Active   HYDROcodone -acetaminophen  (NORCO) 10-325 MG tablet [id number]  Take 0.5-1 tablets by mouth every 8 (eight) hours as needed for severe pain (pain score 7-10). Gradually reducing dose Pender, Julie F, FNP  Active   insulin  glargine, 2 Unit Dial , (TOUJEO  MAX SOLOSTAR) 300 UNIT/ML Solostar Pen [id number] Yes Inject 10 Units into the skin daily. Gareth Clarity FALCON, FNP Taking Active   ketoconazole  (NIZORAL ) 2 % cream [id number]  Apply to the feet QHS. Hester Alm BROCKS, MD  Active   melatonin 3 MG TABS tablet [id number]  Take 3 mg by mouth at bedtime. [provider]  Active Self  metFORMIN  (GLUCOPHAGE ) 1000 MG tablet [id number] Yes TAKE 1 TABLET TWICE A DAY WITH A MEAL Pender, Julie F, FNP Taking Active   metoprolol  succinate (TOPROL -XL) 25 MG 24 hr tablet [id number] Yes Take 1 tablet (25 mg total) by mouth daily. Take with or immediately following a meal. Dunn, Bernardino HERO, PA-C Taking Active   Multiple Vitamin (MULTIVITAMIN) tablet [id number]  Take 1 tablet by mouth daily. [provider]  Active Self  Semaglutide (OZEMPIC, 1 MG/DOSE, [place])  [id number] Yes Inject 1 mg into the skin once a week. [provider] Taking Active   sildenafil  (REVATIO ) 20 MG tablet [id number]  Take 1 tablet (20 mg total) by mouth as needed. Take 1-5 tabs as needed prior to intercourse Penne Knee, MD  Active   tiZANidine  (ZANAFLEX ) 4 MG tablet [id number]  TAKE 1 TABLET EVERY 6 HOURS AS NEEDED FOR MUSCLE SPASMS Pender, Julie F, FNP  Active   torsemide  (DEMADEX ) 20 MG tablet [id number] Yes Take 20 mg by mouth 3 (three) times a week. [provider] Taking Active   valsartan  (DIOVAN ) 40 MG tablet [id number]  Take 1 tablet (40 mg total) by mouth daily. Abigail Bernardino HERO, PA-C  Active            Med Note [place], Jaskaran Dauzat A   Wed Apr 11, 2023  1:24 PM) Reports taking valsartan  80 mg daily  Med List Note Wendelyn Pulling, RN 04/23/18 1019): UDS 02/26/18 MR 08/06/2018 Opiod contract signed by pt.                Assessment/Plan:   Encourage patient to continue to use weekly pillbox. Encourage patient to also use medication list from latest After Visit Summary when filling pillbox  Diabetes: - Currently uncontrolled - Reviewed long term cardiovascular and renal outcomes of uncontrolled blood sugar - Reviewed goal A1c, goal fasting, and goal 2 hour post prandial glucose - Reviewed dietary modifications including importance of having regular well-balanced meals and snacks throughout the day, while controlling carbohydrate portion sizes             Encourage to review nutrition labels for carbohydrate content of foods - Recommend to check glucose, keep log of results and have this record to review at upcoming medical appointments. Patient to contact provider office sooner if needed for readings outside of established parameters or symptoms - Counsel on benefits of continuous glucose monitoring (CGM) to aid with blood sugar control.  Patient denies interest in starting CGM at this time, but will let pharmacist or PCP know if interested in trying in future - Patient completed re-enrollment in patient assistance for Farxiga  from [employer]Me via Digital Assistant Will collaborate with PCP in January to request new prescription be sent to [facility], dispensing pharmacy for [employer] - Patient to follow up with Novo Nordisk patient assistance program as needed for refills of Ozempic and [employer] as needed for refills of Farxiga    Hypertension: - Reviewed appropriate blood pressure monitoring technique and reviewed goal blood pressure.  - Recommended to check home blood pressure and heart rate, keep log of results and have this record to review at upcoming medical appointments. Patient to contact provider office sooner if needed for readings outside of established parameters or symptoms Patient plans to follow up with Cardiologist to provide latest home blood pressure readings and regarding whether he needs to  adjust dose of valsartan  at this time  - Send message to patient's Cardiologist Bernardino Abigail today   Follow Up Plan: Clinical Pharmacist will follow up with patient by telephone on 07/11/2023 at 10:00 AM     Sharyle Sia, PharmD, [facility] [phone]

## 2023-04-12 MED ORDER — DAPAGLIFLOZIN PROPANEDIOL 10 MG PO TABS: 10.0000 mg | ORAL_TABLET | Freq: Every day | ORAL | 3 refills | Status: AC

## 2023-04-19 ENCOUNTER — Ambulatory Visit: Payer: Medicare Other

## 2023-04-22 ENCOUNTER — Other Ambulatory Visit: Payer: Self-pay | Admitting: Internal Medicine

## 2023-04-22 ENCOUNTER — Other Ambulatory Visit: Payer: Self-pay | Admitting: Nurse Practitioner

## 2023-04-22 DIAGNOSIS — I10 Essential (primary) hypertension: Secondary | ICD-10-CM

## 2023-04-23 NOTE — Telephone Encounter (Signed)
Requested Prescriptions  Pending Prescriptions Disp Refills   atorvastatin (LIPITOR) 40 MG tablet [Pharmacy Med Name: ATORVASTATIN TABS 40MG ] 90 tablet 0    Sig: TAKE 1 TABLET AT BEDTIME     Cardiovascular:  Antilipid - Statins Failed - 04/23/2023  8:29 AM      Failed - Lipid Panel in normal range within the last 12 months    Cholesterol, Total  Date Value Ref Range Status  09/17/2017 140 100 - 199 mg/dL Final   Cholesterol  Date Value Ref Range Status  07/28/2022 136 <200 mg/dL Final   LDL Cholesterol (Calc)  Date Value Ref Range Status  07/28/2022 70 mg/dL (calc) Final    Comment:    Reference range: <100 . Desirable range <100 mg/dL for primary prevention;   <70 mg/dL for patients with CHD or diabetic patients  with > or = 2 CHD risk factors. [name] LDL-C is now calculated using the Martin-Hopkins  calculation, which is a validated novel method providing  better accuracy than the Friedewald equation in the  estimation of LDL-C.  Horald Pollen et al. Lenox Ahr. 3295;188(41): 2061-2068  (http://education.Quest[employer]ostics.com/faq/FAQ164)    HDL  Date Value Ref Range Status  07/28/2022 37 (L) > OR = 40 mg/dL Final  66/09/3014 32 (L) >39 mg/dL Final   Triglycerides  Date Value Ref Range Status  07/28/2022 232 (H) <150 mg/dL Final    Comment:    . If a non-fasting specimen was collected, consider repeat triglyceride testing on a fasting specimen if clinically indicated.  Perry Mount et al. J. of Clin. Lipidol. 2015;9:129-169. [name]          Passed - Patient is not pregnant      Passed - Valid encounter within last 12 months    Recent Outpatient Visits           2 months ago Urinary frequency   [facility] Berniece Salines, FNP   2 months ago Essential hypertension   [facility] Berniece Salines, FNP   4 months ago COVID-19   [facility] Della Goo F, FNP   6 months ago Type 2 diabetes mellitus  without complication, without long-term current use of insulin [facility])   [facility] Berniece Salines, FNP   8 months ago DDD (degenerative disc disease), lumbar   [facility] Berniece Salines, FNP       Future Appointments             In 1 week Zane Herald, Rudolpho Sevin, FNP [facility]   In 1 month Dunn, Raymon Mutton, PA-C [facility] [place]   In 4 months Deirdre Evener, MD [facility]

## 2023-04-23 NOTE — Telephone Encounter (Signed)
Unable to refill per protocol, Rx request is too soon for refill. Last refill 03/19/23 for 90 days.E-Prescribing Status: Receipt confirmed by pharmacy (03/19/2023 10:45 AM EST).  Requested Prescriptions  Pending Prescriptions Disp Refills   metoprolol succinate (TOPROL-XL) 100 MG 24 hr tablet [Pharmacy Med Name: METOPROLOL SUCCINATE ER TABS 100MG ] 90 tablet 3    Sig: TAKE 1 TABLET DAILY WITH OR IMMEDIATELY FOLLOWING A MEAL     Cardiovascular:  Beta Blockers Passed - 04/23/2023  8:28 AM      Passed - Last BP in normal range    BP Readings from Last 1 Encounters:  03/19/23 115/66         Passed - Last Heart Rate in normal range    Pulse Readings from Last 1 Encounters:  03/19/23 96         Passed - Valid encounter within last 6 months    Recent Outpatient Visits           2 months ago Urinary frequency   [facility] Berniece Salines, FNP   2 months ago Essential hypertension   [facility] Berniece Salines, FNP   4 months ago COVID-19   [facility] Della Goo F, FNP   6 months ago Type 2 diabetes mellitus without complication, without long-term current use of insulin [facility])   [facility] Berniece Salines, FNP   8 months ago DDD (degenerative disc disease), lumbar   [facility] Berniece Salines, FNP       Future Appointments             In 1 week Zane Herald, Rudolpho Sevin, FNP [facility]   In 1 month Dunn, Raymon Mutton, PA-C [facility] [place]   In 4 months Deirdre Evener, MD [facility]

## 2023-04-30 ENCOUNTER — Ambulatory Visit: Payer: Medicare Other | Admitting: Nurse Practitioner

## 2023-05-01 NOTE — Progress Notes (Unsigned)
There were no vitals taken for this visit.   Subjective:    Patient ID: Evan Rivas, male    DOB: 1950/10/22, 73 y.o.   MRN: [id number]  HPI: Evan Rivas is a 73 y.o. male  No chief complaint on file.   Discussed the use of AI scribe software for clinical note transcription with the patient, who gave verbal consent to proceed.  History of Present Illness           02/20/2023   11:21 AM 01/25/2023   11:10 AM 01/25/2023   10:20 AM  Depression screen PHQ 2/9  Decreased Interest 1 0 0  Down, Depressed, Hopeless 1 0 0  PHQ - 2 Score 2 0 0  Altered sleeping  0   Tired, decreased energy  0   Change in appetite  0   Feeling bad or failure about yourself   0   Trouble concentrating  0   Moving slowly or fidgety/restless  0   Suicidal thoughts  0   PHQ-9 Score  0     Relevant past medical, surgical, family and social history reviewed and updated as indicated. Interim medical history since our last visit reviewed. Allergies and medications reviewed and updated.  Review of Systems  Per HPI unless specifically indicated above     Objective:    There were no vitals taken for this visit.  {Vitals History (Optional):23777} Wt Readings from Last 3 Encounters:  03/19/23 249 lb 9.6 oz (113.2 kg)  02/20/23 248 lb 12.8 oz (112.9 kg)  02/19/23 249 lb 12.8 oz (113.3 kg)    Physical Exam  Results for orders placed or performed in visit on 02/20/23  POCT urinalysis dipstick   Collection Time: 02/20/23 11:27 AM  Result Value Ref Range   Color, UA gold    Clarity, UA murky    Glucose, UA Positive (A) Negative   Bilirubin, UA negative    Ketones, UA negative    Spec Grav, UA 1.020 1.010 - 1.025   Blood, UA moderate    pH, UA 5.0 5.0 - 8.0   Protein, UA Positive (A) Negative   Urobilinogen, UA 0.2 0.2 or 1.0 E.U./dL   Nitrite, UA positive    Leukocytes, UA Large (3+) (A) Negative   Appearance thick    Odor none   Microalbumin / creatinine urine ratio    Collection Time: 02/20/23 11:36 AM  Result Value Ref Range   Creatinine, Urine 101 20 - 320 mg/dL   Microalb, Ur 78.2 mg/dL   Microalb Creat Ratio 517 (H) <30 mg/g creat  Urine Culture   Collection Time: 02/20/23 11:37 AM   Specimen: Urine  Result Value Ref Range   MICRO NUMBER: [id number]    SPECIMEN QUALITY: Adequate    Sample Source URINE    STATUS: FINAL    ISOLATE 1: Escherichia coli (A)       Susceptibility   Escherichia coli - URINE CULTURE, REFLEX    AMOX/CLAVULANIC >=32 Resistant     AMPICILLIN >=32 Resistant     AMPICILLIN/SULBACTAM 16 Intermediate     CEFAZOLIN* >=64 Resistant      * For uncomplicated UTI caused by E. coli, K. pneumoniae or P. mirabilis: Cefazolin is susceptible if MIC <32 mcg/mL and predicts susceptible to the oral agents cefaclor, cefdinir, cefpodoxime, cefprozil, cefuroxime, cephalexin and loracarbef.     CEFTAZIDIME <=1 Sensitive     CEFEPIME <=1 Sensitive     CEFTRIAXONE <=1 Sensitive  CIPROFLOXACIN <=0.25 Sensitive     LEVOFLOXACIN <=0.12 Sensitive     GENTAMICIN <=1 Sensitive     IMIPENEM <=0.25 Sensitive     NITROFURANTOIN <=16 Sensitive     PIP/TAZO <=4 Sensitive     TOBRAMYCIN <=1 Sensitive     TRIMETH/SULFA* <=20 Sensitive      * For uncomplicated UTI caused by E. coli, K. pneumoniae or P. mirabilis: Cefazolin is susceptible if MIC <32 mcg/mL and predicts susceptible to the oral agents cefaclor, cefdinir, cefpodoxime, cefprozil, cefuroxime, cephalexin and loracarbef. Legend: S = Susceptible  I = Intermediate R = Resistant  NS = Not susceptible SDD = Susceptible Dose Dependent * = Not Tested  NR = Not Reported **NN = See Therapy Comments    {Labs (Optional):23779}    Assessment & Plan:   Problem List Items Addressed This Visit   None    Assessment and Plan             Follow up plan: No follow-ups on file.

## 2023-05-02 ENCOUNTER — Encounter: Payer: Self-pay | Admitting: Nurse Practitioner

## 2023-05-02 ENCOUNTER — Ambulatory Visit: Payer: Medicare Other | Admitting: Nurse Practitioner

## 2023-05-02 ENCOUNTER — Telehealth: Payer: Self-pay | Admitting: Nurse Practitioner

## 2023-05-02 VITALS — BP 140/88 | HR 101 | Temp 98.2°F | Resp 18 | Ht 75.0 in | Wt 250.7 lb

## 2023-05-02 DIAGNOSIS — E785 Hyperlipidemia, unspecified: Secondary | ICD-10-CM

## 2023-05-02 DIAGNOSIS — G8929 Other chronic pain: Secondary | ICD-10-CM

## 2023-05-02 DIAGNOSIS — F1120 Opioid dependence, uncomplicated: Secondary | ICD-10-CM | POA: Diagnosis not present

## 2023-05-02 DIAGNOSIS — I700 Atherosclerosis of aorta: Secondary | ICD-10-CM

## 2023-05-02 DIAGNOSIS — M5136 Other intervertebral disc degeneration, lumbar region with discogenic back pain only: Secondary | ICD-10-CM | POA: Diagnosis not present

## 2023-05-02 DIAGNOSIS — E1169 Type 2 diabetes mellitus with other specified complication: Secondary | ICD-10-CM

## 2023-05-02 DIAGNOSIS — M545 Low back pain, unspecified: Secondary | ICD-10-CM

## 2023-05-02 MED ORDER — HYDROCODONE-ACETAMINOPHEN 10-325 MG PO TABS: 0.5000 | ORAL_TABLET | Freq: Three times a day (TID) | ORAL | 0 refills | Status: DC | PRN

## 2023-05-02 MED ORDER — ATORVASTATIN CALCIUM 40 MG PO TABS: 40.0000 mg | ORAL_TABLET | Freq: Every day | ORAL | 1 refills | Status: DC

## 2023-05-02 NOTE — Telephone Encounter (Signed)
Please schedule pt a AWV. Thank you

## 2023-05-03 NOTE — Telephone Encounter (Signed)
I spoke with patient and schedule AWV on 07/27/2023 at 10:10.

## 2023-05-29 DIAGNOSIS — I10 Essential (primary) hypertension: Secondary | ICD-10-CM | POA: Diagnosis not present

## 2023-05-29 DIAGNOSIS — N1831 Chronic kidney disease, stage 3a: Secondary | ICD-10-CM | POA: Diagnosis not present

## 2023-05-29 DIAGNOSIS — N2581 Secondary hyperparathyroidism of renal origin: Secondary | ICD-10-CM | POA: Diagnosis not present

## 2023-05-29 DIAGNOSIS — Z87442 Personal history of urinary calculi: Secondary | ICD-10-CM | POA: Diagnosis not present

## 2023-05-29 DIAGNOSIS — E1122 Type 2 diabetes mellitus with diabetic chronic kidney disease: Secondary | ICD-10-CM | POA: Diagnosis not present

## 2023-06-17 NOTE — Progress Notes (Unsigned)
 Cardiology Office Note    Date:  06/19/2023   ID:  Evan Rivas, DOB 10-15-1950, MRN [id number]  PCP:  Berniece Salines, FNP  Cardiologist:  Yvonne Kendall, MD  Electrophysiologist:  None   Chief Complaint: Follow-up  History of Present Illness:   Evan Rivas is a 73 y.o. male with history of diastolic dysfunction, hyperaldosteronism secondary to adrenal hyperplasia, DM2, HTN, HLD, cancer of the lip, gout, anxiety, depression, OSA intolerant to CPAP, and arthritis with chronic pain who presents for follow-up of lower extremity swelling.  Prior echo from 2019 showed an EF of 60 to 65%, mild LVH, no regional wall motion abnormalities, grade 2 diastolic dysfunction, mild mitral regurgitation, mildly dilated left atrium, and normal RV systolic function and ventricular cavity size.  Renal artery ultrasound in 03/2018 showed no evidence of RAS.  He was seen in the office in 09/2021 following a recent ED visit for near syncope that occurred having been out of town and not having had much to eat or drink.  ED evaluation was unremarkable.  No interventions were indicated at that time.  More recently, he was seen in our office on 01/03/2019 for noting a year-long history of progressive fatigue, weakness, and dyspnea, particularly with exertion.  These symptoms have significantly impacted his daily activities, ultimately leading to his cessation of golf.  He reported an episode of syncope while in [place], that occurred while seated, and had an unrevealing extensive workup.  He also reported an episode of syncope while playing golf, leaning over to pick up his ball on the green, and subsequently passing out.  He noted a significant increase in glucose levels around that time trending from 175 to greater than 300.  Blood pressure was soft at 94/68.  At that time, it was recommended he take half of a valsartan/HCTZ.  11 subsequent labs showed AKI and hyperkalemia with recommendation for ED evaluation.   He was evaluated in the ED with repeat labs showing an improved potassium of 4.7 and improving serum creatinine.  HCTZ was subsequently discontinued with recommendation for patient to take valsartan 40 mg and patient reports metoprolol 50 mg.   He subsequently underwent myocardial PET/CT showed no evidence of ischemia or infarction with an EF of 65% and was overall low risk.  No evidence of coronary artery calcification on CT imaging.  Echo 110/22/2024 showed an EF of 60 to 65%, no regional wall motion abnormality, grade 1 diastolic dysfunction, normal RV systolic function and ventricular cavity size, no significant valvular abnormalities, and an estimated right atrial pressure of 3 mmHg.   He was seen in the office on 02/19/2023 continuing to note exertional fatigue, weakness, and dyspnea without frank chest pain.  No presyncope or syncope.  He also reported a longstanding history of lower extremity swelling with the left lower extremity typically being more noticeable than the right.  Previously noted improvement in swelling with compression socks and leg elevation.  In the setting of elevated blood pressure he was rechallenged with amlodipine 5 mg.  Zio patch demonstrated a predominant rhythm of sinus with an average rate of 77 bpm with occasional PACs with an overall burden of 1.1%, rare PVCs, 9 episodes of SVT lasting up to 2 minutes and 51 seconds with a maximum rate of 148 bpm, and interim second-degree AV block type I with no pauses greater than 3 seconds (results not available at office visit in 03/2023).  He was last seen in the office in 03/2023 and  was without symptoms of angina or cardiac decompensation.  He was without dizziness, presyncope, or syncope.  He reported nephrology had started some iron to 20 mg 3 days/week and also reported to self initiating spironolactone 25 mg twice daily 4 days/week.  Lower extremity swelling was improved.  In the setting of Zio patch, metoprolol was reduced to  25 mg daily.  He comes in today and is without symptoms of angina or cardiac decompensation.  No palpitations, dizziness, presyncope, or syncope.  He continues to note chronic lower extremity swelling with the left lower extremity being worse than the right.  Swelling is no worse following the reinitiation of amlodipine 5 mg.  No lower extremity erythema or weeping.  No recent prolonged sedentary time frames or vascular trauma.  He does wear compression socks to the knees and elevates his legs when sitting and sleeping.  Torsemide 20 mg 3 times per week, followed closely by nephrology.  No longer on spironolactone.  Weight is up 4 pounds today when compared to his visit in 03/2023.   Labs independently reviewed: 05/2023 - Hgb 14.2, PLT 219, BUN 27, serum creatinine 1.38, potassium 4.9, albumin 4.1, magnesium 1.9 01/2023 - A1c 7.2, AST/ALT normal, TSH normal 07/2022 - TC 136, TG 232, HDL 37, LDL 70  Past Medical History:  Diagnosis Date   Adrenal hyperplasia (HCC)    Anxiety    panic attacks   Arthritis    Basal cell carcinoma 2015   R upper lip - tx with MOHS by Dr. Adriana Simas   Basal cell carcinoma 08/25/2021   BCC, nasal tip, Mohs completed on 09/30/21   CKD (chronic kidney disease), stage III (HCC)    Depression    GERD (gastroesophageal reflux disease)    Gout    Grade II diastolic dysfunction    a.) TTE 02/19/2018 --> LVEF 60-65%, mild LVH, G2DD, mild MR, mildly dilated LA, RV cavity size/wall thickness/systolic function normal   History of kidney stones    Hyperaldosteronism (HCC)    a.) secondary to BILATERAL adrenal hyperplasia   Hyperlipidemia    Hypertension    Nerve entrapment syndrome of lower extremity, right 10/24/2019   Pneumonia    2014 and 2018 ( 2018-klebsiella pneumonia    Sleep apnea    T2DM (type 2 diabetes mellitus) (HCC)     Past Surgical History:  Procedure Laterality Date   ARTHROSCOPIC REPAIR ACL Left    CIRCUMCISION N/A 01/17/2021   Procedure: CIRCUMCISION  ADULT;  Surgeon: Vanna Scotland, MD;  Location: [facility];  Service: Urology;  Laterality: N/A;   CYSTOSCOPY W/ URETERAL STENT PLACEMENT Right 09/07/2014   Procedure: CYSTOSCOPY WITH STENT REPLACEMENT;  Surgeon: Vanna Scotland, MD;  Location: [facility];  Service: Urology;  Laterality: Right;   CYSTOSCOPY WITH INSERTION OF UROLIFT N/A 11/24/2019   Procedure: CYSTOSCOPY WITH INSERTION OF UROLIFT;  Surgeon: Vanna Scotland, MD;  Location: [facility];  Service: Urology;  Laterality: N/A;   CYSTOSCOPY/URETEROSCOPY/HOLMIUM LASER/STENT PLACEMENT Right 08/17/2014   Procedure: CYSTOSCOPY/URETEROSCOPY//STENT PLACEMENT/ attempt of lithotripsy;  Surgeon: Vanna Scotland, MD;  Location: [facility];  Service: Urology;  Laterality: Right;   HERNIA REPAIR     JOINT REPLACEMENT     Bilat knees and right hip   MOHS SURGERY     lip   TOTAL HIP ARTHROPLASTY Right 06/16/2020   Procedure: TOTAL HIP ARTHROPLASTY ANTERIOR APPROACH;  Surgeon: Ollen Gross, MD;  Location: [facility];  Service: Orthopedics;  Laterality: Right;    TOTAL KNEE ARTHROPLASTY Left 04/24/2016  Procedure: LEFT TOTAL KNEE ARTHROPLASTY;  Surgeon: Ollen Gross, MD;  Location: [facility];  Service: Orthopedics;  Laterality: Left;  Adductor Block   TOTAL KNEE ARTHROPLASTY Right 10/23/2016   Procedure: RIGHT TOTAL KNEE ARTHROPLASTY;  Surgeon: Ollen Gross, MD;  Location: [facility];  Service: Orthopedics;  Laterality: Right;   URETEROSCOPY WITH HOLMIUM LASER LITHOTRIPSY Right 09/07/2014   Procedure: URETEROSCOPY WITH HOLMIUM LASER LITHOTRIPSY;  Surgeon: Vanna Scotland, MD;  Location: [facility];  Service: Urology;  Laterality: Right;   VASECTOMY      Current Medications: Current Meds  Medication Sig   amLODipine (NORVASC) 5 MG tablet Take 1 tablet (5 mg total) by mouth daily.   aspirin EC 81 MG tablet Take 81 mg by mouth daily. Swallow whole.   atorvastatin (LIPITOR) 40 MG tablet Take 1 tablet (40 mg total) by mouth at bedtime.   clotrimazole  (LOTRIMIN) 1 % cream Apply 1 application topically 2 (two) times daily. Apply for 7-10 days.   dapagliflozin propanediol (FARXIGA) 10 MG TABS tablet Take 1 tablet (10 mg total) by mouth daily before breakfast. Patient receives via [place] Patient Assistance Program   DULoxetine (CYMBALTA) 60 MG capsule TAKE 1 CAPSULE DAILY (MUST ATTEND DECEMBER APPOINTMENT FOR FURTHER REFILLS)   fluticasone (FLONASE) 50 MCG/ACT nasal spray USE 2 SPRAYS IN EACH NOSTRIL DAILY   gabapentin (NEURONTIN) 600 MG tablet Take 1 tablet (600 mg total) by mouth 3 (three) times daily.   glipiZIDE (GLUCOTROL) 10 MG tablet Take 1 tablet (10 mg total) by mouth daily before breakfast.   glucose blood (TRUE METRIX BLOOD GLUCOSE TEST) test strip Test  blood sugar twice a day   [START ON 07/01/2023] HYDROcodone-acetaminophen (NORCO) 10-325 MG tablet Take 0.5-1 tablets by mouth every 8 (eight) hours as needed for severe pain (pain score 7-10). Gradually reducing dose   insulin glargine, 2 Unit Dial, (TOUJEO MAX SOLOSTAR) 300 UNIT/ML Solostar Pen Inject 10 Units into the skin daily.   ketoconazole (NIZORAL) 2 % cream Apply to the feet QHS.   melatonin 3 MG TABS tablet Take 3 mg by mouth at bedtime.   metFORMIN (GLUCOPHAGE) 1000 MG tablet TAKE 1 TABLET TWICE A DAY WITH A MEAL   metoprolol succinate (TOPROL-XL) 25 MG 24 hr tablet Take 1 tablet (25 mg total) by mouth daily. Take with or immediately following a meal.   Multiple Vitamin (MULTIVITAMIN) tablet Take 1 tablet by mouth daily.   Semaglutide (OZEMPIC, 1 MG/DOSE, [place]) Inject 1 mg into the skin once a week.   sildenafil (REVATIO) 20 MG tablet Take 1 tablet (20 mg total) by mouth as needed. Take 1-5 tabs as needed prior to intercourse   tiZANidine (ZANAFLEX) 4 MG tablet TAKE 1 TABLET EVERY 6 HOURS AS NEEDED FOR MUSCLE SPASMS   torsemide (DEMADEX) 20 MG tablet Take 20 mg by mouth 3 (three) times a week.   valsartan (DIOVAN) 40 MG tablet Take 1 tablet (40 mg total) by mouth daily.     Allergies:   Bee venom, Celebrex [celecoxib], Tetanus antitoxin, Tetanus toxoids, and Tetanus-diphtheria toxoids td   Social History   Socioeconomic History   Marital status: Married    Spouse name: Eber Jones   Number of children: 1   Years of education: Not on file   Highest education level: Some college, no degree  Occupational History   Occupation: retired  Tobacco Use   Smoking status: Never   Smokeless tobacco: Never  Vaping Use   Vaping status: Never Used  Substance and Sexual Activity  Alcohol use: No   Drug use: No   Sexual activity: Not Currently    Birth control/protection: None  Other Topics Concern   Not on file  Social History Narrative   Not on file   Social Drivers of Health   Financial Resource Strain: Low Risk  (04/18/2023)   Overall Financial Resource Strain (CARDIA)    Difficulty of Paying Living Expenses: Not very hard  Food Insecurity: No Food Insecurity (04/18/2023)   Hunger Vital Sign    Worried About Running Out of Food in the Last Year: Never true    Ran Out of Food in the Last Year: Never true  Transportation Needs: No Transportation Needs (04/18/2023)   PRAPARE - Administrator, Civil Service (Medical): No    Lack of Transportation (Non-Medical): No  Physical Activity: Inactive (04/18/2023)   Exercise Vital Sign    Days of Exercise per Week: 0 days    Minutes of Exercise per Session: 30 min  Stress: No Stress Concern Present (04/18/2023)   [employer] of Occupational Health - Occupational Stress Questionnaire    Feeling of Stress : Only a little  Social Connections: Moderately Isolated (04/18/2023)   Social Connection and Isolation Panel [NHANES]    Frequency of Communication with Friends and Family: Three times a week    Frequency of Social Gatherings with Friends and Family: Three times a week    Attends Religious Services: Never    Active Member of Clubs or Organizations: No    Attends Engineer, structural:  Not on file    Marital Status: Married     Family History:  The patient's family history includes Arthritis in his mother; Cancer in his mother; Depression in his sister; Heart disease in his father; Hyperlipidemia in his mother and sister; Hypertension in his mother and sister; Kidney disease in his maternal grandmother; Learning disabilities in his sister; Obesity in his father and sister; Stroke in his father.  ROS:   12-point review of systems is negative unless otherwise noted in the HPI.   EKGs/Labs/Other Studies Reviewed:    Studies reviewed were summarized above. The additional studies were reviewed today:  2D echo 02/2018: - Left ventricle: The cavity size was normal. Wall thickness was   increased in a pattern of mild LVH. Systolic function was normal.   The estimated ejection fraction was in the range of 60% to 65%.   Wall motion was normal; there were no regional wall motion   abnormalities. Features are consistent with a pseudonormal left   ventricular filling pattern, with concomitant abnormal relaxation   and increased filling pressure (grade 2 diastolic dysfunction). - Mitral valve: There was mild regurgitation. - Left atrium: The atrium was mildly dilated. - Right ventricle: The cavity size was normal. Wall thickness was   normal. Systolic function was normal. __________   Renal artery ultrasound 03/2018: Summary: Largest Aortic Diameter: 2.0 cm   Renal:   Right: Normal size right kidney. Normal right Resisitive Index.        Abnormal cortical thickness of right kidney. No evidence of        right renal artery stenosis. RRV flow present. Left:  LRV flow present. No evidence of left renal artery stenosis.        Normal size of left kidney. Normal left Resistive Index.        Normal cortical thickness of the left kidney. Mesenteric: Normal Celiac artery and Superior Mesenteric artery findings. __________   Myocardial/CT  01/18/2023:   LV perfusion is  normal. There is no evidence of ischemia. There is no evidence of infarction.   Stress left ventricular function is normal. Stress EF: 68%. Resting EF not accurate due to poor gating and not reported End diastolic cavity size is normal. End systolic cavity size is normal.   Myocardial blood flow was computed to be 0.64ml/g/min at rest and 2.17ml/g/min at stress. Global myocardial blood flow reserve was 2.45 and was normal.   Coronary calcium was absent on the attenuation correction CT images.   The study is normal. The study is low risk. ___________   2D echo 01/23/2023: 1. Left ventricular ejection fraction, by estimation, is 60 to 65%. The  left ventricle has normal function. The left ventricle has no regional  wall motion abnormalities. Left ventricular diastolic parameters are  consistent with Grade I diastolic  dysfunction (impaired relaxation).   2. Right ventricular systolic function is normal. The right ventricular  size is normal. Tricuspid regurgitation signal is inadequate for assessing  PA pressure.   3. The mitral valve is normal in structure. No evidence of mitral valve  regurgitation. No evidence of mitral stenosis.   4. The aortic valve is tricuspid. Aortic valve regurgitation is not  visualized. No aortic stenosis is present.   5. The inferior vena cava is normal in size with greater than 50%  respiratory variability, suggesting right atrial pressure of 3 mmHg.  __________   Luci Bank patch 02/2023:   The patient was monitored for 14 days.   The predominant rhythm was sinus with an average rate of 77 bpm (rante 55-100 bpm in sinus).   There were occasional PAC's (1.1% burden) and rare PVC's.   9 supraventricular runs were observed, lasting up to 2 minutes, 51 seconds with a maximum rate of 148 bpm.   Intermittent Mobitz type 1 second degree AV block was seen.  There was no pause greater than 3 seconds.   No patient triggered events occurred.   Predominantly sinus rhythm  with PSVT and intermittent Mobitz type 1 second degree AV block as well as occasional PAC's (see details above).   EKG:  EKG is not ordered today.    Recent Labs: 01/03/2023: Hemoglobin 15.0; Platelets 237; TSH 1.880 01/04/2023: ALT 29 01/16/2023: BUN 27; Creatinine, Ser 1.30; Potassium 4.9; Sodium 146  Recent Lipid Panel    Component Value Date/Time   CHOL 136 07/28/2022 1039   CHOL 140 09/17/2017 1014   TRIG 232 (H) 07/28/2022 1039   HDL 37 (L) 07/28/2022 1039   HDL 32 (L) 09/17/2017 1014   CHOLHDL 3.7 07/28/2022 1039   LDLCALC 70 07/28/2022 1039    PHYSICAL EXAM:    VS:  BP (!) 140/76 (BP Location: Left Arm)   Pulse 94   Ht 6\' 3"  (1.905 m)   Wt 253 lb (114.8 kg)   SpO2 98%   BMI 31.62 kg/m   BMI: Body mass index is 31.62 kg/m.  Physical Exam Vitals reviewed.  Constitutional:      Appearance: He is well-developed.  HENT:     Head: Normocephalic and atraumatic.  Eyes:     General:        Right eye: No discharge.        Left eye: No discharge.  Cardiovascular:     Rate and Rhythm: Normal rate and regular rhythm.     Heart sounds: Normal heart sounds, S1 normal and S2 normal. Heart sounds not distant. No midsystolic click and no opening  snap. No murmur heard.    No friction rub.  Pulmonary:     Effort: Pulmonary effort is normal. No respiratory distress.     Breath sounds: Normal breath sounds. No decreased breath sounds, wheezing, rhonchi or rales.  Chest:     Chest wall: No tenderness.  Musculoskeletal:     Cervical back: Normal range of motion.     Right lower leg: Edema present.     Left lower leg: Edema present.     Comments: Compression socks noted.  1+ bilateral pitting lower extremity edema to the midshin.  Skin:    General: Skin is warm and dry.     Nails: There is no clubbing.  Neurological:     Mental Status: He is alert and oriented to person, place, and time.  Psychiatric:        Speech: Speech normal.        Behavior: Behavior normal.         Thought Content: Thought content normal.        Judgment: Judgment normal.     Wt Readings from Last 3 Encounters:  06/19/23 253 lb (114.8 kg)  05/02/23 250 lb 11.2 oz (113.7 kg)  03/19/23 249 lb 9.6 oz (113.2 kg)     ASSESSMENT & PLAN:   History of syncope with associated fatigue and weakness: No further episodes of syncope.  Etiology felt to be less likely arrhythmogenic per EP.  Reassuring echo and myocardial PET/CT.  Zio patch with sinus rhythm with PSVT and intermittent Mobitz type I second-degree AV block.  No indication for PPM at this time.  Query if some of his associated fatigue is related to untreated sleep apnea.  Lower extremity swelling: Cannot exclude some degree of venous insufficiency or dependent edema.  Continue with leg elevation and compression socks.  Schedule venous duplex to evaluate for DVT as well as a CT of the abdomen/pelvis, without contrast given underlying renal dysfunction, to evaluate for compressive process given left lower extremity greater than right.  If the above evaluation is unrevealing anticipate referral to vascular surgery.  Exertional dyspnea: No current symptoms.  Myocardial PET/CT and echo unrevealing as outlined above.  HTN due to endocrine disorder: Blood pressure is mildly elevated in the office today, typically well-controlled.  He remains on amlodipine 5 mg, valsartan 40 mg, reduced dose Toprol-XL 25 mg, and torsemide 20 mg 3 times weekly.  Low-sodium diet recommended.  Aortic atherosclerosis/HLD: LDL 70 in 07/2022 with normal AST/ALT in 01/2023.  He remains on atorvastatin 40 mg.  CKD stage IIIa with history of hyperkalemia: Followed by nephrology.  Avoid nephrotoxic agents.  OSA: Intolerant to CPAP.  Notes an increase in daytime somnolence.  Likely contributory to conduction abnormality noted on a recent Zio patch.  Anticipate referral to sleep medicine.     Disposition: F/u with Dr. Okey Dupre or an APP in 1 month.   Medication  Adjustments/Labs and Tests Ordered: Current medicines are reviewed at length with the patient today.  Concerns regarding medicines are outlined above. Medication changes, Labs and Tests ordered today are summarized above and listed in the Patient Instructions accessible in Encounters.   Signed, Eula Listen, PA-C 06/19/2023 1:23 PM     [facility] -  [address] [place], [place] [address] [phone]

## 2023-06-19 ENCOUNTER — Encounter: Payer: Self-pay | Admitting: Physician Assistant

## 2023-06-19 ENCOUNTER — Ambulatory Visit: Payer: Medicare Other | Attending: Physician Assistant | Admitting: Physician Assistant

## 2023-06-19 VITALS — BP 140/76 | HR 94 | Ht 75.0 in | Wt 253.0 lb

## 2023-06-19 DIAGNOSIS — R0609 Other forms of dyspnea: Secondary | ICD-10-CM | POA: Diagnosis not present

## 2023-06-19 DIAGNOSIS — R55 Syncope and collapse: Secondary | ICD-10-CM | POA: Diagnosis not present

## 2023-06-19 DIAGNOSIS — I152 Hypertension secondary to endocrine disorders: Secondary | ICD-10-CM

## 2023-06-19 DIAGNOSIS — R1900 Intra-abdominal and pelvic swelling, mass and lump, unspecified site: Secondary | ICD-10-CM | POA: Diagnosis not present

## 2023-06-19 DIAGNOSIS — R600 Localized edema: Secondary | ICD-10-CM

## 2023-06-19 DIAGNOSIS — Z8639 Personal history of other endocrine, nutritional and metabolic disease: Secondary | ICD-10-CM

## 2023-06-19 DIAGNOSIS — G4733 Obstructive sleep apnea (adult) (pediatric): Secondary | ICD-10-CM

## 2023-06-19 DIAGNOSIS — I700 Atherosclerosis of aorta: Secondary | ICD-10-CM

## 2023-06-19 DIAGNOSIS — N1831 Chronic kidney disease, stage 3a: Secondary | ICD-10-CM

## 2023-06-19 DIAGNOSIS — E785 Hyperlipidemia, unspecified: Secondary | ICD-10-CM

## 2023-06-19 NOTE — Patient Instructions (Addendum)
 Medication Instructions:  Your physician recommends that you continue on your current medications as directed. Please refer to the Current Medication list given to you today.  *If you need a refill on your cardiac medications before your next appointment, please call your pharmacy*   Lab Work: No labs ordered today  If you have labs (blood work) drawn today and your tests are completely normal, you will receive your results only by: MyChart Message (if you have MyChart) OR A paper copy in the mail If you have any lab test that is abnormal or we need to change your treatment, we will call you to review the results.   Testing/Procedures:   Your  CT  Abdomen pelvis without contrast will be scheduled at one of the below locations:   [facility] [address] [place], [place] [address] [phone]   If scheduled  [facility], please arrive 15 mins early for check-in and test prep.  There is spacious parking and easy access to the radiology department from the [facility] Medical mall  entrance. Please enter here and check-in with the desk attendant.    Your physician has requested that you have a lower extremity venous Ultra sound  During this test, ultrasound is used to evaluate blood flow in the legs. Allow one hour for this exam. There are no restrictions or special instructions. This will take place at [address] [facility]) [address], [place] [address]  Please note: We ask at that you not bring children with you during ultrasound (echo/ vascular) testing. Due to room size and safety concerns, children are not allowed in the ultrasound rooms during exams. Our front office staff cannot provide observation of children in our lobby area while testing is being conducted. An adult accompanying a patient to their appointment will only be allowed in the ultrasound room at the discretion of the ultrasound technician under special  circumstances. We apologize for any inconvenience.     Follow-Up: At [facility], you and your health needs are our priority.  As part of our continuing mission to provide you with exceptional heart care, we have created designated Provider Care Teams.  These Care Teams include your primary Cardiologist (physician) and Advanced Practice Providers (APPs -  Physician Assistants and Nurse Practitioners) who all work together to provide you with the care you need, when you need it.  We recommend signing up for the patient portal called "MyChart".  Sign up information is provided on this After Visit Summary.  MyChart is used to connect with patients for Virtual Visits (Telemedicine).  Patients are able to view lab/test results, encounter notes, upcoming appointments, etc.  Non-urgent messages can be sent to your provider as well.   To learn more about what you can do with MyChart, go to [email or web address].    Your next appointment:   1 month(s)  Provider:   You will see one of the following Advanced Practice Providers on your designated Care Team:   Nicolasa Ducking, NP Eula Listen, PA-C Cadence Fransico Fard, PA-C Charlsie Quest, NP Carlos Levering, NP

## 2023-06-26 ENCOUNTER — Ambulatory Visit
Admission: RE | Admit: 2023-06-26 | Discharge: 2023-06-26 | Disposition: A | Discharge status: Home / Self Care | Source: Ambulatory Visit | Attending: Physician Assistant | Admitting: Physician Assistant

## 2023-06-26 DIAGNOSIS — K4090 Unilateral inguinal hernia, without obstruction or gangrene, not specified as recurrent: Secondary | ICD-10-CM | POA: Diagnosis not present

## 2023-06-26 DIAGNOSIS — R1900 Intra-abdominal and pelvic swelling, mass and lump, unspecified site: Secondary | ICD-10-CM | POA: Diagnosis not present

## 2023-06-26 DIAGNOSIS — N200 Calculus of kidney: Secondary | ICD-10-CM | POA: Diagnosis not present

## 2023-06-26 DIAGNOSIS — K439 Ventral hernia without obstruction or gangrene: Secondary | ICD-10-CM | POA: Diagnosis not present

## 2023-06-26 DIAGNOSIS — N281 Cyst of kidney, acquired: Secondary | ICD-10-CM | POA: Diagnosis not present

## 2023-06-27 ENCOUNTER — Ambulatory Visit: Attending: Physician Assistant

## 2023-07-06 ENCOUNTER — Other Ambulatory Visit: Payer: Self-pay | Admitting: Nurse Practitioner

## 2023-07-06 DIAGNOSIS — F331 Major depressive disorder, recurrent, moderate: Secondary | ICD-10-CM

## 2023-07-06 DIAGNOSIS — F419 Anxiety disorder, unspecified: Secondary | ICD-10-CM

## 2023-07-06 NOTE — Telephone Encounter (Signed)
 Last OV 05/02/23 Requested Prescriptions  Pending Prescriptions Disp Refills   DULoxetine (CYMBALTA) 60 MG capsule [Pharmacy Med Name: DULOXETINE HCL DR CAPS 60MG ] 90 capsule 3    Sig: TAKE 1 CAPSULE DAILY (MUST ATTEND DECEMBER APPOINTMENT FOR FURTHER REFILLS)     Psychiatry: Antidepressants - SNRI - duloxetine Failed - 07/06/2023  3:12 PM      Failed - Cr in normal range and within 360 days    Creat  Date Value Ref Range Status  07/28/2022 1.46 (H) 0.70 - 1.28 mg/dL Final   Creatinine, Ser  Date Value Ref Range Status  01/16/2023 1.30 (H) 0.76 - 1.27 mg/dL Final   Creatinine, Urine  Date Value Ref Range Status  02/20/2023 101 20 - 320 mg/dL Final         Failed - Last BP in normal range    BP Readings from Last 1 Encounters:  06/19/23 (!) 140/76         Failed - Valid encounter within last 6 months    Recent Outpatient Visits   None     Future Appointments             In 2 weeks Dunn, Raymon Mutton, PA-C [facility] [place]   In 3 weeks Berniece Salines, FNP [facility]   In 1 month Deirdre Evener, MD [facility]            Passed - eGFR is 30 or above and within 360 days    GFR, Est African American  Date Value Ref Range Status  03/16/2020 51 (L) > OR = 60 mL/min/1.45m2 Final   GFR, Est Non African American  Date Value Ref Range Status  03/16/2020 44 (L) > OR = 60 mL/min/1.64m2 Final   GFR, Estimated  Date Value Ref Range Status  01/04/2023 41 (L) >60 mL/min Final    Comment:    (NOTE) Calculated using the CKD-EPI Creatinine Equation (2021)    eGFR  Date Value Ref Range Status  01/16/2023 58 (L) >59 mL/min/1.73 Final         Passed - Completed PHQ-2 or PHQ-9 in the last 360 days

## 2023-07-11 ENCOUNTER — Other Ambulatory Visit: Payer: Self-pay | Admitting: Pharmacist

## 2023-07-11 ENCOUNTER — Other Ambulatory Visit: Payer: Self-pay | Admitting: Internal Medicine

## 2023-07-11 DIAGNOSIS — I10 Essential (primary) hypertension: Secondary | ICD-10-CM

## 2023-07-11 DIAGNOSIS — E1165 Type 2 diabetes mellitus with hyperglycemia: Secondary | ICD-10-CM

## 2023-07-11 NOTE — Patient Instructions (Signed)
 Goals Addressed             This Visit's Progress    Pharmacy Goals       If you need to reach out to patient assistance programs regarding refills or to find out the status of your application, you can do so by calling:  [employer] at [phone] [employer] at [phone]  Thank you!  Estelle Grumbles, PharmD, [facility] [phone]

## 2023-07-11 NOTE — Progress Notes (Signed)
 07/11/2023 Name: Evan Rivas MRN: [id number] DOB: 12-Dec-1950  Chief Complaint  Patient presents with   Medication Management   Medication Assistance    Evan Rivas is a 73 y.o. year old male who presented for a telephone visit.   They were referred to the pharmacist by their PCP for assistance in managing diabetes and medication access.      Subjective:   Care Team: Primary Care Provider: Berniece Salines, FNP ; Next Scheduled Visit: 08/01/2023 Cardiologist: Yvonne Kendall, MD ; Next Scheduled Visit: 07/20/2023 Nephrologist: Mosetta Pigeon, MD  Medication Access/Adherence  Current Pharmacy:  [facility] [address] [facility] - [place], [place] - [address] AT [address] [facility] [address] [place] [address] Phone: [phone] Fax: [phone]  [facility] - [place], [place] - [address] [place] [address] Phone: [phone] Fax: [phone]   Patient reports affordability concerns with their medications: No  Patient reports access/transportation concerns to their pharmacy: No  Patient reports adherence concerns with their medications:  No     Using weekly pillbox; denies missed doses   Diabetes:   Current medications:  - Farxiga 10 mg daily before breakfast - Ozempic 1 mg weekly  Reports tolerating well - glipizide 10 mg daily 30 minutes before breakfast  - metformin 1000 mg twice daily - Toujeo 12 units daily   Previous therapies tried: Trulicity (switched to Ozempic)   Current glucose readings: morning fasting blood sugar ranging: 150-180 *Reports recently elevated recently when eating out more while at the beach   Patient denies hypoglycemic s/sx including dizziness, shakiness, sweating.     Reports has recently noticed a benefit in weight control with Ozempic. Reports weight recently ~250 lbs   Current physical activity: reports currently limited due to swelling in legs    Statin therapy: atorvastatin 40 mg daily   Current medication access support:  - Enrolled in patient assistance for Farxiga through [employer] through 04/02/2024 - Enrolled in patient assistance for Ozempic through [employer] through 04/02/2024     Hypertension:   Patient followed by [facility]   Current medications:  - amlodipine 5 mg daily - metoprolol ER 25 mg daily - torsemide 20 mg three times weekly - valsartan 40 mg daily   Medications previously tried: spironolactone (CKD, hyperkalemia), valsartan-HCTZ   Patient has an automated, upper arm home BP cuff Current blood pressure readings readings: this morning: 145/90, HR 84 (had not yet taken morning medication)    Patient denies hypotension, such as dizziness or lightheadedness    Current physical activity: reports currently limited due to swelling in legs     Objective:  Lab Results  Component Value Date   HGBA1C 7.2 (A) 01/25/2023    Lab Results  Component Value Date   CREATININE 1.30 (H) 01/16/2023   BUN 27 01/16/2023   NA 146 (H) 01/16/2023   K 4.9 01/16/2023   CL 108 (H) 01/16/2023   CO2 24 01/16/2023    Lab Results  Component Value Date   CHOL 136 07/28/2022   HDL 37 (L) 07/28/2022   LDLCALC 70 07/28/2022   TRIG 232 (H) 07/28/2022   CHOLHDL 3.7 07/28/2022   BP Readings from Last 3 Encounters:  06/19/23 (!) 140/76  05/02/23 (!) 140/88  03/19/23 115/66   Pulse Readings from Last 3 Encounters:  06/19/23 94  05/02/23 (!) 101  03/19/23 96     Medications Reviewed Today  Reviewed by Manuela Neptune, RPH-CPP (Pharmacist) on 07/11/23 at 1028  Med List Status: <None>   Medication Order Taking? Sig Documenting Provider Last Dose Status Informant  amLODipine (NORVASC) 5 MG tablet [id number] Yes Take 1 tablet (5 mg total) by mouth daily. Sondra Barges, PA-C Taking Active   aspirin EC 81 MG tablet [id number] Yes Take 81 mg by mouth daily. Swallow whole. [provider] Taking  Active Self  atorvastatin (LIPITOR) 40 MG tablet [id number] Yes Take 1 tablet (40 mg total) by mouth at bedtime. Berniece Salines, FNP Taking Active   Cholecalciferol 50 MCG (2000 UT) TABS [id number] Yes Take 1 tablet by mouth daily. [provider] Taking Active   clotrimazole (LOTRIMIN) 1 % cream [id number]  Apply 1 application topically 2 (two) times daily. Apply for 7-10 days. Carman Ching, PA-C  Active   dapagliflozin propanediol (FARXIGA) 10 MG TABS tablet [id number] Yes Take 1 tablet (10 mg total) by mouth daily before breakfast. Patient receives via [employer] Patient Assistance Program Berniece Salines, [place] Taking Active   DULoxetine (CYMBALTA) 60 MG capsule [id number]  TAKE 1 CAPSULE DAILY (MUST ATTEND DECEMBER APPOINTMENT FOR FURTHER REFILLS) Berniece Salines, FNP  Active   fluticasone [facility]) 50 MCG/ACT nasal spray [id number]  USE 2 SPRAYS IN EACH NOSTRIL DAILY Berniece Salines, FNP  Active   gabapentin (NEURONTIN) 600 MG tablet [id number] Yes Take 1 tablet (600 mg total) by mouth 3 (three) times daily. Berniece Salines, FNP Taking Active   glipiZIDE (GLUCOTROL) 10 MG tablet [id number] Yes Take 1 tablet (10 mg total) by mouth daily before breakfast. Berniece Salines, FNP Taking Active   glucose blood (TRUE METRIX BLOOD GLUCOSE TEST) test strip [id number]  Test  blood sugar twice a day Berniece Salines, FNP  Active   HYDROcodone-acetaminophen [facility]) 10-325 MG tablet [id number] Yes Take 0.5-1 tablets by mouth every 8 (eight) hours as needed for severe pain (pain score 7-10). Gradually reducing dose Berniece Salines, FNP Taking Active   insulin glargine, 2 Unit Dial, (TOUJEO MAX SOLOSTAR) 300 UNIT/ML Solostar Pen [id number] Yes Inject 10 Units into the skin daily.  Patient taking differently: Inject 12 Units into the skin daily.   Berniece Salines, FNP Taking Active   ketoconazole (NIZORAL) 2 % cream [id number]  Apply to the feet QHS. Deirdre Evener, MD  Active   melatonin 3 MG TABS tablet  [id number] Yes Take 3 mg by mouth at bedtime. [provider] Taking Active Self  metFORMIN (GLUCOPHAGE) 1000 MG tablet [id number] Yes TAKE 1 TABLET TWICE A DAY WITH A MEAL Berniece Salines, FNP Taking Active   metoprolol succinate (TOPROL-XL) 25 MG 24 hr tablet [id number] Yes Take 1 tablet (25 mg total) by mouth daily. Take with or immediately following a meal. Dunn, Raymon Mutton, PA-C Taking Active   Multiple Vitamin (MULTIVITAMIN) tablet [id number] Yes Take 1 tablet by mouth daily. [provider] Taking Active Self  Semaglutide (OZEMPIC, 1 MG/DOSE, [place]) [id number] Yes Inject 1 mg into the skin once a week. [provider] Taking Active   sildenafil (REVATIO) 20 MG tablet [id number]  Take 1 tablet (20 mg total) by mouth as needed. Take 1-5 tabs as needed prior to intercourse Vanna Scotland, MD  Active   tiZANidine (ZANAFLEX) 4 MG tablet [id number] Yes TAKE 1 TABLET EVERY 6 HOURS AS NEEDED FOR MUSCLE SPASMS Berniece Salines, FNP Taking Active   torsemide (DEMADEX) 20 MG tablet [id number] Yes Take 20 mg by mouth  3 (three) times a week. [provider] Taking Active   valsartan (DIOVAN) 40 MG tablet [id number] Yes Take 1 tablet (40 mg total) by mouth daily. Sondra Barges, PA-C Taking Active            Med Note Henreitta Leber, Renato Battles Jun 19, 2023 10:50 AM)    Med List Note Nonah Mattes, RN 04/23/18 1019): UDS 02/26/18 MR 08/06/2018 Opiod contract signed by pt.               Assessment/Plan:   Encourage patient to continue to use weekly pillbox as adherence aid   Diabetes: - Currently uncontrolled - Reviewed long term cardiovascular and renal outcomes of uncontrolled blood sugar - Reviewed goal A1c, goal fasting, and goal 2 hour post prandial glucose - Reviewed dietary modifications including importance of having regular well-balanced meals and snacks throughout the day, while controlling carbohydrate portion sizes  Encourage patient to limit sugary beverage  consumption, drinking water instead - Recommend to check glucose, keep log of results and have this record to review at upcoming medical appointments. Patient to contact provider office sooner if needed for readings outside of established parameters or symptoms - Counsel on benefits of continuous glucose monitoring (CGM) to aid with blood sugar control.  Patient denies interest in starting CGM at this time, but will let pharmacist or PCP know if interested in trying in future - Patient to follow up with [employer] patient assistance program as needed for refills of Ozempic and [employer] as needed for refills of Farxiga   Hypertension: - Reviewed appropriate blood pressure monitoring technique and reviewed goal blood pressure.  - Recommended to check home blood pressure and heart rate, keep log of results and have this record to review at upcoming medical appointments. Patient to contact provider office sooner if needed for readings outside of established parameters or symptoms    Follow Up Plan: Clinical Pharmacist will follow up with patient by telephone on 01/23/2024 at 10:30 AM    Estelle Grumbles, PharmD, [facility] [phone]

## 2023-07-12 NOTE — Telephone Encounter (Signed)
 D/C 01/03/23. Requested Prescriptions  Refused Prescriptions Disp Refills   valsartan-hydrochlorothiazide (DIOVAN-HCT) 320-12.5 MG tablet [Pharmacy Med Name: VALSARTAN/HCTZ TABS 320/12.5MG ] 90 tablet 3    Sig: TAKE 1 TABLET DAILY     Cardiovascular: ARB + Diuretic Combos Failed - 07/12/2023  9:11 AM      Failed - Na in normal range and within 180 days    Sodium  Date Value Ref Range Status  01/16/2023 146 (H) 134 - 144 mmol/L Final         Failed - Cr in normal range and within 180 days    Creat  Date Value Ref Range Status  07/28/2022 1.46 (H) 0.70 - 1.28 mg/dL Final   Creatinine, Ser  Date Value Ref Range Status  01/16/2023 1.30 (H) 0.76 - 1.27 mg/dL Final   Creatinine, Urine  Date Value Ref Range Status  02/20/2023 101 20 - 320 mg/dL Final         Failed - Last BP in normal range    BP Readings from Last 1 Encounters:  06/19/23 (!) 140/76         Failed - Valid encounter within last 6 months    Recent Outpatient Visits   None     Future Appointments             In 1 week Dunn, Raymon Mutton, PA-C [facility] [place]   In 2 weeks Berniece Salines, FNP [facility], [facility]   In 1 month Deirdre Evener, MD [facility]            Passed - K in normal range and within 180 days    Potassium  Date Value Ref Range Status  01/16/2023 4.9 3.5 - 5.2 mmol/L Final         Passed - eGFR is 10 or above and within 180 days    GFR, Est African American  Date Value Ref Range Status  03/16/2020 51 (L) > OR = 60 mL/min/1.72m2 Final   GFR, Est Non African American  Date Value Ref Range Status  03/16/2020 44 (L) > OR = 60 mL/min/1.70m2 Final   GFR, Estimated  Date Value Ref Range Status  01/04/2023 41 (L) >60 mL/min Final    Comment:    (NOTE) Calculated using the CKD-EPI Creatinine Equation (2021)    eGFR  Date Value Ref Range Status  01/16/2023 58 (L) >59 mL/min/1.73 Final         Passed - Patient is not  pregnant

## 2023-07-19 ENCOUNTER — Ambulatory Visit: Attending: Physician Assistant

## 2023-07-19 DIAGNOSIS — R600 Localized edema: Secondary | ICD-10-CM

## 2023-07-19 NOTE — Progress Notes (Signed)
 Cardiology Office Note    Date:  07/20/2023   ID:  Evan Rivas, DOB 1950-10-29, MRN [id number]  PCP:  Gareth Mliss FALCON, FNP  Cardiologist:  Lonni Hanson, MD  Electrophysiologist:  None   Chief Complaint: Follow up  History of Present Illness:   Evan Rivas is a 73 y.o. male with history of diastolic dysfunction, hyperaldosteronism secondary to adrenal hyperplasia, DM2, HTN, HLD, cancer of the lip, gout, anxiety, depression, OSA intolerant to CPAP, and arthritis with chronic pain who presents for follow-up of lower extremity swelling.   Prior echo from 2019 showed an EF of 60 to 65%, mild LVH, no regional wall motion abnormalities, grade 2 diastolic dysfunction, mild mitral regurgitation, mildly dilated left atrium, and normal RV systolic function and ventricular cavity size.  Renal artery ultrasound in 03/2018 showed no evidence of RAS.  He was seen in the office in 09/2021 following a recent ED visit for near syncope that occurred having been out of town and not having had much to eat or drink.  ED evaluation was unremarkable.  No interventions were indicated at that time.  More recently, he was seen in our office on 01/03/2019 for noting a year-long history of progressive fatigue, weakness, and dyspnea, particularly with exertion.  These symptoms have significantly impacted his daily activities, ultimately leading to his cessation of golf.  He reported an episode of syncope while in [place] , that occurred while seated, and had an unrevealing extensive workup.  He also reported an episode of syncope while playing golf, leaning over to pick up his ball on the green, and subsequently passing out.  He noted a significant increase in glucose levels around that time trending from 175 to greater than 300.  Blood pressure was soft at 94/68.  At that time, it was recommended he take half of a valsartan /HCTZ.  11 subsequent labs showed AKI and hyperkalemia with recommendation for ED evaluation.   He was evaluated in the ED with repeat labs showing an improved potassium of 4.7 and improving serum creatinine.  HCTZ was subsequently discontinued with recommendation for patient to take valsartan  40 mg and patient reports metoprolol  50 mg.   He subsequently underwent myocardial PET/CT showed no evidence of ischemia or infarction with an EF of 65% and was overall low risk.  No evidence of coronary artery calcification on CT imaging.  Echo 110/22/2024 showed an EF of 60 to 65%, no regional wall motion abnormality, grade 1 diastolic dysfunction, normal RV systolic function and ventricular cavity size, no significant valvular abnormalities, and an estimated right atrial pressure of 3 mmHg.   He was seen in the office on 02/19/2023 continuing to note exertional fatigue, weakness, and dyspnea without frank chest pain.  No presyncope or syncope.  He also reported a longstanding history of lower extremity swelling with the left lower extremity typically being more noticeable than the right.  Previously noted improvement in swelling with compression socks and leg elevation.  In the setting of elevated blood pressure he was rechallenged with amlodipine  5 mg.  Zio patch demonstrated a predominant rhythm of sinus with an average rate of 77 bpm with occasional PACs with an overall burden of 1.1%, rare PVCs, 9 episodes of SVT lasting up to 2 minutes and 51 seconds with a maximum rate of 148 bpm, and interim second-degree AV block type I with no pauses greater than 3 seconds (results not available at office visit in 03/2023).   He was seen in the office in  03/2023 and was without dizziness, presyncope, or syncope.  He reported nephrology had started torsemide  20 mg 3 days/week and also reported to self initiating spironolactone  25 mg twice daily 4 days/week.  Lower extremity swelling was improved.  In the setting of Zio patch, metoprolol  was reduced to 25 mg daily.  He was last seen in the office in 06/2023 and continued  to note chronic lower extremity swelling with the left lower extremity being worse than the right.  Swelling was no worse following the reinitiation of amlodipine .  He was taking torsemide  20 mg 3 days/week and was no longer on spironolactone .  CT of the abdomen/pelvis in 06/2023, to evaluate for compressive process contributing to lower extremity swelling showed no acute findings in the abdomen or pelvis with incidental findings noted in the report.  Lower extremity venous duplex negative for DVT bilaterally on 07/19/2023.  He comes in today and remains without symptoms of angina or cardiac decompensation.  He continues to note chronic lower extremity swelling with the left being worse than the right.  Swelling does appear to be somewhat improved when compared to his visit last month.  He has been taking torsemide  20 mg daily for the past 2 weeks.  Continues to elevate legs and wear compression socks.  No further syncopal episodes.  His weight is down 2 pounds today when compared to his last visit.  Will stay open to early morning hours at times watching sporting events.  Wakes up frequently to void and will turn the TV back on during these episodes as well.   Labs independently reviewed: 05/2023 - Hgb 14.2, PLT 219, BUN 27, serum creatinine 1.38, potassium 4.9, albumin 4.1, magnesium  1.9 01/2023 - A1c 7.2, AST/ALT normal, TSH normal 07/2022 - TC 136, TG 232, HDL 37, LDL 70  Past Medical History:  Diagnosis Date   Adrenal hyperplasia (HCC)    Anxiety    panic attacks   Arthritis    Basal cell carcinoma 2015   R upper lip - tx with MOHS by Dr. Bluford   Basal cell carcinoma 08/25/2021   BCC, nasal tip, Mohs completed on 09/30/21   CKD (chronic kidney disease), stage III (HCC)    Depression    GERD (gastroesophageal reflux disease)    Gout    Grade II diastolic dysfunction    a.) TTE 02/19/2018 --> LVEF 60-65%, mild LVH, G2DD, mild MR, mildly dilated LA, RV cavity size/wall thickness/systolic  function normal   History of kidney stones    Hyperaldosteronism (HCC)    a.) secondary to BILATERAL adrenal hyperplasia   Hyperlipidemia    Hypertension    Nerve entrapment syndrome of lower extremity, right 10/24/2019   Pneumonia    2014 and 2018 ( 2018-klebsiella pneumonia    Sleep apnea    T2DM (type 2 diabetes mellitus) (HCC)     Past Surgical History:  Procedure Laterality Date   ARTHROSCOPIC REPAIR ACL Left    CIRCUMCISION N/A 01/17/2021   Procedure: CIRCUMCISION ADULT;  Surgeon: Penne Knee, MD;  Location: [facility];  Service: Urology;  Laterality: N/A;   CYSTOSCOPY W/ URETERAL STENT PLACEMENT Right 09/07/2014   Procedure: CYSTOSCOPY WITH STENT REPLACEMENT;  Surgeon: Knee Penne, MD;  Location: [facility];  Service: Urology;  Laterality: Right;   CYSTOSCOPY WITH INSERTION OF UROLIFT N/A 11/24/2019   Procedure: CYSTOSCOPY WITH INSERTION OF UROLIFT;  Surgeon: Penne Knee, MD;  Location: [facility];  Service: Urology;  Laterality: N/A;   CYSTOSCOPY/URETEROSCOPY/HOLMIUM LASER/STENT PLACEMENT Right 08/17/2014  Procedure: CYSTOSCOPY/URETEROSCOPY//STENT PLACEMENT/ attempt of lithotripsy;  Surgeon: Rosina Riis, MD;  Location: [facility];  Service: Urology;  Laterality: Right;   HERNIA REPAIR     JOINT REPLACEMENT     Bilat knees and right hip   MOHS SURGERY     lip   TOTAL HIP ARTHROPLASTY Right 06/16/2020   Procedure: TOTAL HIP ARTHROPLASTY ANTERIOR APPROACH;  Surgeon: Melodi Lerner, MD;  Location: [facility];  Service: Orthopedics;  Laterality: Right;    TOTAL KNEE ARTHROPLASTY Left 04/24/2016   Procedure: LEFT TOTAL KNEE ARTHROPLASTY;  Surgeon: Lerner Melodi, MD;  Location: [facility];  Service: Orthopedics;  Laterality: Left;  Adductor Block   TOTAL KNEE ARTHROPLASTY Right 10/23/2016   Procedure: RIGHT TOTAL KNEE ARTHROPLASTY;  Surgeon: Melodi Lerner, MD;  Location: [facility];  Service: Orthopedics;  Laterality: Right;   URETEROSCOPY WITH HOLMIUM LASER LITHOTRIPSY Right  09/07/2014   Procedure: URETEROSCOPY WITH HOLMIUM LASER LITHOTRIPSY;  Surgeon: Rosina Riis, MD;  Location: [facility];  Service: Urology;  Laterality: Right;   VASECTOMY      Current Medications: Current Meds  Medication Sig   aspirin  EC 81 MG tablet Take 81 mg by mouth daily. Swallow whole.   atorvastatin  (LIPITOR) 40 MG tablet Take 1 tablet (40 mg total) by mouth at bedtime.   Cholecalciferol 50 MCG (2000 UT) TABS Take 1 tablet by mouth daily.   clotrimazole  (LOTRIMIN ) 1 % cream Apply 1 application topically 2 (two) times daily. Apply for 7-10 days.   dapagliflozin  propanediol (FARXIGA ) 10 MG TABS tablet Take 1 tablet (10 mg total) by mouth daily before breakfast. Patient receives via [place] Patient Assistance Program   fluticasone  (FLONASE ) 50 MCG/ACT nasal spray USE 2 SPRAYS IN EACH NOSTRIL DAILY   gabapentin  (NEURONTIN ) 600 MG tablet Take 1 tablet (600 mg total) by mouth 3 (three) times daily.   glipiZIDE  (GLUCOTROL ) 10 MG tablet Take 1 tablet (10 mg total) by mouth daily before breakfast.   glucose blood (TRUE METRIX BLOOD GLUCOSE TEST) test strip Test  blood sugar twice a day   HYDROcodone -acetaminophen  (NORCO) 10-325 MG tablet Take 0.5-1 tablets by mouth every 8 (eight) hours as needed for severe pain (pain score 7-10). Gradually reducing dose   insulin  glargine, 2 Unit Dial , (TOUJEO  MAX SOLOSTAR) 300 UNIT/ML Solostar Pen Inject 10 Units into the skin daily. (Patient taking differently: Inject 12 Units into the skin daily.)   ketoconazole  (NIZORAL ) 2 % cream Apply to the feet QHS.   melatonin 3 MG TABS tablet Take 3 mg by mouth at bedtime.   metFORMIN  (GLUCOPHAGE ) 1000 MG tablet TAKE 1 TABLET TWICE A DAY WITH A MEAL   Multiple Vitamin (MULTIVITAMIN) tablet Take 1 tablet by mouth daily.   Semaglutide (OZEMPIC, 1 MG/DOSE, [place]) Inject 1 mg into the skin once a week.   sildenafil  (REVATIO ) 20 MG tablet Take 1 tablet (20 mg total) by mouth as needed. Take 1-5 tabs as needed prior to  intercourse   tiZANidine  (ZANAFLEX ) 4 MG tablet TAKE 1 TABLET EVERY 6 HOURS AS NEEDED FOR MUSCLE SPASMS   torsemide  (DEMADEX ) 20 MG tablet Take 20 mg by mouth daily.   valsartan  (DIOVAN ) 40 MG tablet Take 1 tablet (40 mg total) by mouth daily.   [DISCONTINUED] amLODipine  (NORVASC ) 5 MG tablet Take 1 tablet (5 mg total) by mouth daily.   [DISCONTINUED] metoprolol  succinate (TOPROL -XL) 25 MG 24 hr tablet Take 1 tablet (25 mg total) by mouth daily. Take with or immediately following a meal.  Allergies:   Bee venom, Celebrex [celecoxib], Tetanus antitoxin, Tetanus toxoids, and Tetanus-diphtheria toxoids td   Social History   Socioeconomic History   Marital status: Married    Spouse name: Elveria   Number of children: 1   Years of education: Not on file   Highest education level: Some college, no degree  Occupational History   Occupation: retired  Tobacco Use   Smoking status: Never   Smokeless tobacco: Never  Vaping Use   Vaping status: Never Used  Substance and Sexual Activity   Alcohol  use: No   Drug use: No   Sexual activity: Not Currently    Birth control/protection: None  Other Topics Concern   Not on file  Social History Narrative   Not on file   Social Drivers of Health   Financial Resource Strain: Low Risk  (04/18/2023)   Overall Financial Resource Strain (CARDIA)    Difficulty of Paying Living Expenses: Not very hard  Food Insecurity: No Food Insecurity (04/18/2023)   Hunger Vital Sign    Worried About Running Out of Food in the Last Year: Never true    Ran Out of Food in the Last Year: Never true  Transportation Needs: No Transportation Needs (04/18/2023)   PRAPARE - Administrator, Civil Service (Medical): No    Lack of Transportation (Non-Medical): No  Physical Activity: Inactive (04/18/2023)   Exercise Vital Sign    Days of Exercise per Week: 0 days    Minutes of Exercise per Session: 30 min  Stress: No Stress Concern Present (04/18/2023)    [employer] of Occupational Health - Occupational Stress Questionnaire    Feeling of Stress : Only a little  Social Connections: Moderately Isolated (04/18/2023)   Social Connection and Isolation Panel [NHANES]    Frequency of Communication with Friends and Family: Three times a week    Frequency of Social Gatherings with Friends and Family: Three times a week    Attends Religious Services: Never    Active Member of Clubs or Organizations: No    Attends Engineer, Structural: Not on file    Marital Status: Married     Family History:  The patient's family history includes Arthritis in his mother; Cancer in his mother; Depression in his sister; Heart disease in his father; Hyperlipidemia in his mother and sister; Hypertension in his mother and sister; Kidney disease in his maternal grandmother; Learning disabilities in his sister; Obesity in his father and sister; Stroke in his father.  ROS:   12-point review of systems is negative unless otherwise noted in the HPI.   EKGs/Labs/Other Studies Reviewed:    Studies reviewed were summarized above. The additional studies were reviewed today:  2D echo 02/2018: - Left ventricle: The cavity size was normal. Wall thickness was   increased in a pattern of mild LVH. Systolic function was normal.   The estimated ejection fraction was in the range of 60% to 65%.   Wall motion was normal; there were no regional wall motion   abnormalities. Features are consistent with a pseudonormal left   ventricular filling pattern, with concomitant abnormal relaxation   and increased filling pressure (grade 2 diastolic dysfunction). - Mitral valve: There was mild regurgitation. - Left atrium: The atrium was mildly dilated. - Right ventricle: The cavity size was normal. Wall thickness was   normal. Systolic function was normal. __________   Renal artery ultrasound 03/2018: Summary: Largest Aortic Diameter: 2.0 cm   Renal:   Right:  Normal  size right kidney. Normal right Resisitive Index.        Abnormal cortical thickness of right kidney. No evidence of        right renal artery stenosis. RRV flow present. Left:  LRV flow present. No evidence of left renal artery stenosis.        Normal size of left kidney. Normal left Resistive Index.        Normal cortical thickness of the left kidney. Mesenteric: Normal Celiac artery and Superior Mesenteric artery findings. __________   Myocardial/CT 01/18/2023:   LV perfusion is normal. There is no evidence of ischemia. There is no evidence of infarction.   Stress left ventricular function is normal. Stress EF: 68%. Resting EF not accurate due to poor gating and not reported End diastolic cavity size is normal. End systolic cavity size is normal.   Myocardial blood flow was computed to be 0.70ml/g/min at rest and 2.44ml/g/min at stress. Global myocardial blood flow reserve was 2.45 and was normal.   Coronary calcium  was absent on the attenuation correction CT images.   The study is normal. The study is low risk. ___________   2D echo 01/23/2023: 1. Left ventricular ejection fraction, by estimation, is 60 to 65%. The  left ventricle has normal function. The left ventricle has no regional  wall motion abnormalities. Left ventricular diastolic parameters are  consistent with Grade I diastolic  dysfunction (impaired relaxation).   2. Right ventricular systolic function is normal. The right ventricular  size is normal. Tricuspid regurgitation signal is inadequate for assessing  PA pressure.   3. The mitral valve is normal in structure. No evidence of mitral valve  regurgitation. No evidence of mitral stenosis.   4. The aortic valve is tricuspid. Aortic valve regurgitation is not  visualized. No aortic stenosis is present.   5. The inferior vena cava is normal in size with greater than 50%  respiratory variability, suggesting right atrial pressure of 3 mmHg.  __________   Zio patch  02/2023:   The patient was monitored for 14 days.   The predominant rhythm was sinus with an average rate of 77 bpm (rante 55-100 bpm in sinus).   There were occasional PAC's (1.1% burden) and rare PVC's.   9 supraventricular runs were observed, lasting up to 2 minutes, 51 seconds with a maximum rate of 148 bpm.   Intermittent Mobitz type 1 second degree AV block was seen.  There was no pause greater than 3 seconds.   No patient triggered events occurred.   Predominantly sinus rhythm with PSVT and intermittent Mobitz type 1 second degree AV block as well as occasional PAC's (see details above).   EKG:  EKG is ordered today.  The EKG ordered today demonstrates NSR with first-degree AV block, 80 bpm, nonspecific ST-T changes  Recent Labs: 01/03/2023: Hemoglobin 15.0; Platelets 237; TSH 1.880 01/04/2023: ALT 29 01/16/2023: BUN 27; Creatinine, Ser 1.30; Potassium 4.9; Sodium 146  Recent Lipid Panel    Component Value Date/Time   CHOL 136 07/28/2022 1039   CHOL 140 09/17/2017 1014   TRIG 232 (H) 07/28/2022 1039   HDL 37 (L) 07/28/2022 1039   HDL 32 (L) 09/17/2017 1014   CHOLHDL 3.7 07/28/2022 1039   LDLCALC 70 07/28/2022 1039    PHYSICAL EXAM:    VS:  BP 118/73 (BP Location: Left Arm, Patient Position: Sitting, Cuff Size: Normal)   Pulse 80   Ht 6' 3 (1.905 m)   Wt 251 lb 12.8 oz (  114.2 kg)   SpO2 96%   BMI 31.47 kg/m   BMI: Body mass index is 31.47 kg/m.  Physical Exam Vitals reviewed.  Constitutional:      Appearance: He is well-developed.  HENT:     Head: Normocephalic and atraumatic.  Eyes:     General:        Right eye: No discharge.        Left eye: No discharge.  Cardiovascular:     Rate and Rhythm: Normal rate and regular rhythm.     Heart sounds: Normal heart sounds, S1 normal and S2 normal. Heart sounds not distant. No midsystolic click and no opening snap. No murmur heard.    No friction rub.  Pulmonary:     Effort: Pulmonary effort is normal. No  respiratory distress.     Breath sounds: Normal breath sounds. No decreased breath sounds, wheezing, rhonchi or rales.  Chest:     Chest wall: No tenderness.  Musculoskeletal:     Cervical back: Normal range of motion.     Right lower leg: Edema present.     Left lower leg: Edema present.     Comments: Chronic bilateral lower extremity swelling noted with the left being greater than the right.  Overall, lower extremity swelling is improved when compared to last visit.  Compression socks in place.  Skin:    General: Skin is warm and dry.     Nails: There is no clubbing.  Neurological:     Mental Status: He is alert and oriented to person, place, and time.  Psychiatric:        Speech: Speech normal.        Behavior: Behavior normal.        Thought Content: Thought content normal.        Judgment: Judgment normal.     Wt Readings from Last 3 Encounters:  07/20/23 251 lb 12.8 oz (114.2 kg)  06/19/23 253 lb (114.8 kg)  05/02/23 250 lb 11.2 oz (113.7 kg)     ASSESSMENT & PLAN:   History of syncope with associated fatigue and weakness: No further episodes of syncope. Etiology felt to be less likely arrhythmogenic per EP. Reassuring echo and myocardial PET/CT. Zio patch with sinus rhythm with PSVT and intermittent Mobitz type I second-degree AV block. No indication for PPM at this time. Query if some of his associated fatigue is related to untreated sleep apnea and poor sleep hygiene, staying up to early morning hours at times.   Lower extremity swelling: Likely in the setting of venous insufficiency and exacerbated by calcium  channel blocker use.  Lower extremity venous ultrasound negative for DVT bilaterally.  No compressive process noted on CT of the abdomen/pelvis.  Discontinue amlodipine .  Schedule venous reflux ultrasound.  Continue with compression socks and leg elevation.  Has been taking torsemide  20 mg daily with some improvement in lower extremity swelling.  Check BMP.  HTN due  to endocrine disorder: Blood pressure blood pressure is well-controlled in the office today.  Discontinue amlodipine  given lower extremity swelling.  With this we will titrate Toprol -XL to 50 mg daily.  He will otherwise continue valsartan  40 mg.  Low-sodium diet recommended.  Aortic atherosclerosis/HLD: LDL 70 in 07/2022 with normal AST/ALT in 01/2023.  He remains on atorvastatin  40 mg.  CKD stage IIIa with history of hyperkalemia: Check BMP given he has been taking torsemide  20 mg daily.  OSA: Intolerant to CPAP.  Would benefit from referral to sleep medicine.  Not  discussed at today's visit.     Disposition: F/u with Dr. Mady or an APP in 2 months.   Medication Adjustments/Labs and Tests Ordered: Current medicines are reviewed at length with the patient today.  Concerns regarding medicines are outlined above. Medication changes, Labs and Tests ordered today are summarized above and listed in the Patient Instructions accessible in Encounters.   Signed, Bernardino Bring, PA-C 07/20/2023 11:58 AM     [facility] [place] [address] [place], [place] [address] [phone]

## 2023-07-20 ENCOUNTER — Encounter: Payer: Self-pay | Admitting: Physician Assistant

## 2023-07-20 ENCOUNTER — Ambulatory Visit: Admitting: Physician Assistant

## 2023-07-20 VITALS — BP 118/73 | HR 80 | Ht 75.0 in | Wt 251.8 lb

## 2023-07-20 DIAGNOSIS — I152 Hypertension secondary to endocrine disorders: Secondary | ICD-10-CM

## 2023-07-20 DIAGNOSIS — I700 Atherosclerosis of aorta: Secondary | ICD-10-CM

## 2023-07-20 DIAGNOSIS — N1831 Chronic kidney disease, stage 3a: Secondary | ICD-10-CM

## 2023-07-20 DIAGNOSIS — R55 Syncope and collapse: Secondary | ICD-10-CM

## 2023-07-20 DIAGNOSIS — R0609 Other forms of dyspnea: Secondary | ICD-10-CM

## 2023-07-20 DIAGNOSIS — R600 Localized edema: Secondary | ICD-10-CM | POA: Diagnosis not present

## 2023-07-20 DIAGNOSIS — G4733 Obstructive sleep apnea (adult) (pediatric): Secondary | ICD-10-CM

## 2023-07-20 DIAGNOSIS — E785 Hyperlipidemia, unspecified: Secondary | ICD-10-CM

## 2023-07-20 DIAGNOSIS — Z8639 Personal history of other endocrine, nutritional and metabolic disease: Secondary | ICD-10-CM

## 2023-07-20 DIAGNOSIS — Z79899 Other long term (current) drug therapy: Secondary | ICD-10-CM

## 2023-07-20 MED ORDER — METOPROLOL SUCCINATE ER 50 MG PO TB24: 50.0000 mg | ORAL_TABLET | Freq: Every day | ORAL | 3 refills | Status: DC

## 2023-07-20 NOTE — Patient Instructions (Signed)
 Medication Instructions:  Your physician recommends the following medication changes.  STOP TAKING: Amlodipine   INCREASE: Metoprolol  succinate (Toprol -XL) to 50 mg daily  *If you need a refill on your cardiac medications before your next appointment, please call your pharmacy*  Lab Work: Your provider would like for you to have following labs drawn today BMeT.   If you have labs (blood work) drawn today and your tests are completely normal, you will receive your results only by: MyChart Message (if you have MyChart) OR A paper copy in the mail If you have any lab test that is abnormal or we need to change your treatment, we will call you to review the results.  Testing/Procedures: Your physician has requested that you have a lower extremity venous reflux. This test is an ultrasound of the veins in the legs or arms. It looks at venous blood flow that carries blood from the heart to the legs or arms. Allow one hour for a Lower Venous exam. Allow thirty minutes for an Upper Venous exam. There are no restrictions or special instructions.This will take place at [address] [facility]) [address], [place] [address]  Please note: We ask at that you not bring children with you during ultrasound (echo/ vascular) testing. Due to room size and safety concerns, children are not allowed in the ultrasound rooms during exams. Our front office staff cannot provide observation of children in our lobby area while testing is being conducted. An adult accompanying a patient to their appointment will only be allowed in the ultrasound room at the discretion of the ultrasound technician under special circumstances. We apologize for any inconvenience.    Follow-Up: At [facility], you and your health needs are our priority.  As part of our continuing mission to provide you with exceptional heart care, our providers are all part of one team.  This team includes your primary Cardiologist  (physician) and Advanced Practice Providers or APPs (Physician Assistants and Nurse Practitioners) who all work together to provide you with the care you need, when you need it.  Your next appointment:   2 month(s)  Provider:   You may see Sammy Crisp, MD or Varney Gentleman, PA-C

## 2023-07-21 LAB — BASIC METABOLIC PANEL WITH GFR
BUN/Creatinine Ratio: 16 (ref 10–24)
BUN: 23 mg/dL (ref 8–27)
CO2: 23 mmol/L (ref 20–29)
Calcium: 9.7 mg/dL (ref 8.6–10.2)
Chloride: 104 mmol/L (ref 96–106)
Creatinine, Ser: 1.46 mg/dL — ABNORMAL HIGH (ref 0.76–1.27)
Glucose: 161 mg/dL — ABNORMAL HIGH (ref 70–99)
Potassium: 4.5 mmol/L (ref 3.5–5.2)
Sodium: 145 mmol/L — ABNORMAL HIGH (ref 134–144)
eGFR: 51 mL/min/{1.73_m2} — ABNORMAL LOW (ref >59)

## 2023-07-24 ENCOUNTER — Other Ambulatory Visit: Payer: Self-pay

## 2023-07-24 DIAGNOSIS — N1831 Chronic kidney disease, stage 3a: Secondary | ICD-10-CM

## 2023-07-27 ENCOUNTER — Ambulatory Visit (INDEPENDENT_AMBULATORY_CARE_PROVIDER_SITE_OTHER): Payer: Self-pay

## 2023-07-27 VITALS — BP 118/73 | Ht 75.0 in | Wt 248.0 lb

## 2023-07-27 DIAGNOSIS — Z0000 Encounter for general adult medical examination without abnormal findings: Secondary | ICD-10-CM | POA: Diagnosis not present

## 2023-07-27 DIAGNOSIS — E119 Type 2 diabetes mellitus without complications: Secondary | ICD-10-CM

## 2023-07-27 NOTE — Patient Instructions (Signed)
 Managing Pain Without Opioids Opioids are strong medicines used to treat moderate to severe pain. For some people, especially those who have long-term (chronic) pain, opioids may not be the best choice for pain management due to: Side effects like nausea, constipation, and sleepiness. The risk of addiction (opioid use disorder). The longer you take opioids, the greater your risk of addiction. Pain that lasts for more than 3 months is called chronic pain. Managing chronic pain usually requires more than one approach and is often provided by a team of health care providers working together (multidisciplinary approach). Pain management may be done at a pain management center or pain clinic. How to manage pain without the use of opioids Use non-opioid medicines Non-opioid medicines for pain may include: Over-the-counter or prescription non-steroidal anti-inflammatory drugs (NSAIDs). These may be the first medicines used for pain. They work well for muscle and bone pain, and they reduce swelling. Acetaminophen . This over-the-counter medicine may work well for milder pain but not swelling. Antidepressants. These may be used to treat chronic pain. A certain type of antidepressant (tricyclics) is often used. These medicines are given in lower doses for pain than when used for depression. Anticonvulsants. These are usually used to treat seizures but may also reduce nerve (neuropathic) pain. Muscle relaxants. These relieve pain caused by sudden muscle tightening (spasms). You may also use a pain medicine that is applied to the skin as a patch, cream, or gel (topical analgesic), such as a numbing medicine. These may cause fewer side effects than medicines taken by mouth. Do certain therapies as directed Some therapies can help with pain management. They include: Physical therapy. You will do exercises to gain strength and flexibility. A physical therapist may teach you exercises to move and stretch parts of  your body that are weak, stiff, or painful. You can learn these exercises at physical therapy visits and practice them at home. Physical therapy may also involve: Massage. Heat wraps or applying heat or cold to affected areas. Electrical signals that interrupt pain signals (transcutaneous electrical nerve stimulation, TENS). Weak lasers that reduce pain and swelling (low-level laser therapy). Signals from your body that help you learn to regulate pain (biofeedback). Occupational therapy. This helps you to learn ways to function at home and work with less pain. Recreational therapy. This involves trying new activities or hobbies, such as a physical activity or drawing. Mental health therapy, including: Cognitive behavioral therapy (CBT). This helps you learn coping skills for dealing with pain. Acceptance and commitment therapy (ACT) to change the way you think and react to pain. Relaxation therapies, including muscle relaxation exercises and mindfulness-based stress reduction. Pain management counseling. This may be individual, family, or group counseling.  Receive medical treatments Medical treatments for pain management include: Nerve block injections. These may include a pain blocker and anti-inflammatory medicines. You may have injections: Near the spine to relieve chronic back or neck pain. Into joints to relieve back or joint pain. Into nerve areas that supply a painful area to relieve body pain. Into muscles (trigger point injections) to relieve some painful muscle conditions. A medical device placed near your spine to help block pain signals and relieve nerve pain or chronic back pain (spinal cord stimulation device). Acupuncture. Follow these instructions at home Medicines Take over-the-counter and prescription medicines only as told by your health care provider. If you are taking pain medicine, ask your health care providers about possible side effects to watch out for. Do not  drive or use heavy machinery  while taking prescription opioid pain medicine. Lifestyle  Do not use drugs or alcohol  to reduce pain. If you drink alcohol , limit how much you have to: 0-1 drink a day for women who are not pregnant. 0-2 drinks a day for men. Know how much alcohol  is in a drink. In the [place] one drink equals one 12 oz bottle of beer (355 mL), one 5 oz glass of wine (148 mL), or one 1 oz glass of hard liquor (44 mL). Do not use any products that contain nicotine or tobacco. These products include cigarettes, chewing tobacco, and vaping devices, such as e-cigarettes. If you need help quitting, ask your health care provider. Eat a healthy diet and maintain a healthy weight. Poor diet and excess weight may make pain worse. Eat foods that are high in fiber. These include fresh fruits and vegetables, whole grains, and beans. Limit foods that are high in fat and processed sugars, such as fried and sweet foods. Exercise regularly. Exercise lowers stress and may help relieve pain. Ask your health care provider what activities and exercises are safe for you. If your health care provider approves, join an exercise class that combines movement and stress reduction. Examples include yoga and tai chi. Get enough sleep. Lack of sleep may make pain worse. Lower stress as much as possible. Practice stress reduction techniques as told by your therapist. General instructions Work with all your pain management providers to find the treatments that work best for you. You are an important member of your pain management team. There are many things you can do to reduce pain on your own. Consider joining an online or in-person support group for people who have chronic pain. Keep all follow-up visits. This is important. Where to find more information You can find more information about managing pain without opioids from: American Academy of Pain Medicine: painmed.org Institute for Chronic Pain:  instituteforchronicpain.org American Chronic Pain Association: theacpa.org Contact a health care provider if: You have side effects from pain medicine. Your pain gets worse or does not get better with treatments or home therapy. You are struggling with anxiety or depression. Summary Many types of pain can be managed without opioids. Chronic pain may respond better to pain management without opioids. Pain is best managed when you and a team of health care providers work together. Pain management without opioids may include non-opioid medicines, medical treatments, physical therapy, mental health therapy, and lifestyle changes. Tell your health care providers if your pain gets worse or is not being managed well enough. This information is not intended to replace advice given to you by your health care provider. Make sure you discuss any questions you have with your health care provider. Document Revised: 06/30/2020 Document Reviewed: 06/30/2020 [employer] Patient Education  2024 [employer][name] , Thank you for taking time to come for your Medicare Wellness Visit. I appreciate your ongoing commitment to your health goals. Please review the following plan we discussed and let me know if I can assist you in the future.   Referrals/Orders/Follow-Ups/Clinician Recommendations: follow up as scheduled for next annual wellness visit.  This is a list of the screening recommended for you and due dates:  Health Maintenance  Topic Date Due   Eye exam for diabetics  06/17/2022   COVID-19 Vaccine (4 - 2024-25 season) 12/03/2022   Complete foot exam   02/03/2023   DTaP/Tdap/Td vaccine (2 - Td or Tdap) 06/06/2023   Hemoglobin A1C  07/26/2023   Zoster (Shingles) Vaccine (1  of 2) 07/31/2023*   Hepatitis C Screening  01/17/2024*   Flu Shot  11/02/2023   Yearly kidney health urinalysis for diabetes  02/20/2024   Yearly kidney function blood test for diabetes  07/19/2024   Medicare Annual Wellness  Visit  07/26/2024   Colon Cancer Screening  10/26/2030   Pneumonia Vaccine  Completed   HPV Vaccine  Aged Out   Meningitis B Vaccine  Aged Out  *Topic was postponed. The date shown is not the original due date.    Advanced directives: (Declined) Advance directive discussed with you today. Even though you declined this today, please call our office should you change your mind, and we can give you the proper paperwork for you to fill out.  Next Medicare Annual Wellness Visit scheduled for next year: Yes

## 2023-07-27 NOTE — Progress Notes (Signed)
 Because this visit was a virtual/telehealth visit,  certain criteria was not obtained, such a blood pressure, CBG if applicable, and timed get up and go. Any medications not marked as "taking" were not mentioned during the medication reconciliation part of the visit. Any vitals not documented were not able to be obtained due to this being a telehealth visit or patient was unable to self-report a recent blood pressure reading due to a lack of equipment at home via telehealth. Vitals that have been documented are verbally provided by the patient.  Subjective:   Evan Rivas is a 73 y.o. who presents for a Medicare Wellness preventive visit.  Visit Complete: Virtual I connected with  Evan Rivas on 07/27/23 by a audio enabled telemedicine application and verified that I am speaking with the correct person using two identifiers.  Patient Location: Home  Provider Location: Home Office  I discussed the limitations of evaluation and management by telemedicine. The patient expressed understanding and agreed to proceed.  Vital Signs: Because this visit was a virtual/telehealth visit, some criteria may be missing or patient reported. Any vitals not documented were not able to be obtained and vitals that have been documented are patient reported.  VideoDeclined- This patient declined Librarian, academic. Therefore the visit was completed with audio only.  Persons Participating in Visit: Patient.  AWV Questionnaire: Yes: Patient Medicare AWV questionnaire was completed by the patient on 07/23/2023; I have confirmed that all information answered by patient is correct and no changes since this date.  Cardiac Risk Factors include: advanced age (>36men, >62 women);diabetes mellitus;male gender;obesity (BMI >30kg/m2);dyslipidemia     Objective:    Today's Vitals   07/27/23 1021  BP: 118/73  Weight: 248 lb (112.5 kg)  Height: 6\' 3"  (1.905 m)   Body mass index is  31 kg/m.     07/27/2023   10:26 AM 01/04/2023    9:33 AM 03/10/2021    8:24 AM 01/17/2021   11:41 AM 01/11/2021    8:14 AM 06/16/2020    1:00 PM 06/15/2020    9:15 AM  Advanced Directives  Does Patient Have a Medical Advance Directive? No No No No No No No  Would patient like information on creating a medical advance directive? No - Patient declined No - Patient declined No - Patient declined No - Patient declined No - Patient declined No - Patient declined     Current Medications (verified) Outpatient Encounter Medications as of 07/27/2023  Medication Sig   aspirin  EC 81 MG tablet Take 81 mg by mouth daily. Swallow whole.   atorvastatin  (LIPITOR) 40 MG tablet Take 1 tablet (40 mg total) by mouth at bedtime.   Cholecalciferol 50 MCG (2000 UT) TABS Take 1 tablet by mouth daily.   clotrimazole  (LOTRIMIN ) 1 % cream Apply 1 application topically 2 (two) times daily. Apply for 7-10 days.   dapagliflozin  propanediol (FARXIGA ) 10 MG TABS tablet Take 1 tablet (10 mg total) by mouth daily before breakfast. Patient receives via [place] Patient Assistance Program   fluticasone  (FLONASE ) 50 MCG/ACT nasal spray USE 2 SPRAYS IN EACH NOSTRIL DAILY   gabapentin  (NEURONTIN ) 600 MG tablet Take 1 tablet (600 mg total) by mouth 3 (three) times daily.   glipiZIDE  (GLUCOTROL ) 10 MG tablet Take 1 tablet (10 mg total) by mouth daily before breakfast.   glucose blood (TRUE METRIX BLOOD GLUCOSE TEST) test strip Test  blood sugar twice a day   HYDROcodone -acetaminophen  (NORCO) 10-325 MG tablet Take 0.5-1  tablets by mouth every 8 (eight) hours as needed for severe pain (pain score 7-10). Gradually reducing dose   insulin  glargine, 2 Unit Dial, (TOUJEO  MAX SOLOSTAR) 300 UNIT/ML Solostar Pen Inject 10 Units into the skin daily. (Patient taking differently: Inject 12 Units into the skin daily.)   ketoconazole  (NIZORAL ) 2 % cream Apply to the feet QHS.   melatonin 3 MG TABS tablet Take 3 mg by mouth at bedtime.   metFORMIN   (GLUCOPHAGE ) 1000 MG tablet TAKE 1 TABLET TWICE A DAY WITH A MEAL   metoprolol  succinate (TOPROL -XL) 50 MG 24 hr tablet Take 1 tablet (50 mg total) by mouth daily. Take with or immediately following a meal.   Multiple Vitamin (MULTIVITAMIN) tablet Take 1 tablet by mouth daily.   Semaglutide (OZEMPIC, 1 MG/DOSE, [place]) Inject 1 mg into the skin once a week.   sildenafil  (REVATIO ) 20 MG tablet Take 1 tablet (20 mg total) by mouth as needed. Take 1-5 tabs as needed prior to intercourse   tiZANidine  (ZANAFLEX ) 4 MG tablet TAKE 1 TABLET EVERY 6 HOURS AS NEEDED FOR MUSCLE SPASMS   torsemide (DEMADEX) 20 MG tablet Take 20 mg by mouth daily.   valsartan  (DIOVAN ) 40 MG tablet Take 1 tablet (40 mg total) by mouth daily.   No facility-administered encounter medications on file as of 07/27/2023.    Allergies (verified) Bee venom, Celebrex [celecoxib], Tetanus antitoxin, Tetanus toxoids, and Tetanus-diphtheria toxoids td   History: Past Medical History:  Diagnosis Date   Adrenal hyperplasia (HCC)    Anxiety    panic attacks   Arthritis    Basal cell carcinoma 2015   R upper lip - tx with MOHS by Dr. Debrah Fan   Basal cell carcinoma 08/25/2021   BCC, nasal tip, Mohs completed on 09/30/21   CKD (chronic kidney disease), stage III (HCC)    Depression    GERD (gastroesophageal reflux disease)    Gout    Grade II diastolic dysfunction    a.) TTE 02/19/2018 --> LVEF 60-65%, mild LVH, G2DD, mild MR, mildly dilated LA, RV cavity size/wall thickness/systolic function normal   History of kidney stones    Hyperaldosteronism (HCC)    a.) secondary to BILATERAL adrenal hyperplasia   Hyperlipidemia    Hypertension    Nerve entrapment syndrome of lower extremity, right 10/24/2019   Pneumonia    2014 and 2018 ( 2018-klebsiella pneumonia    Sleep apnea    T2DM (type 2 diabetes mellitus) (HCC)    Past Surgical History:  Procedure Laterality Date   ARTHROSCOPIC REPAIR ACL Left    CIRCUMCISION N/A 01/17/2021    Procedure: CIRCUMCISION ADULT;  Surgeon: Dustin Gimenez, MD;  Location: [facility];  Service: Urology;  Laterality: N/A;   CYSTOSCOPY W/ URETERAL STENT PLACEMENT Right 09/07/2014   Procedure: CYSTOSCOPY WITH STENT REPLACEMENT;  Surgeon: Dustin Gimenez, MD;  Location: [facility];  Service: Urology;  Laterality: Right;   CYSTOSCOPY WITH INSERTION OF UROLIFT N/A 11/24/2019   Procedure: CYSTOSCOPY WITH INSERTION OF UROLIFT;  Surgeon: Dustin Gimenez, MD;  Location: [facility];  Service: Urology;  Laterality: N/A;   CYSTOSCOPY/URETEROSCOPY/HOLMIUM LASER/STENT PLACEMENT Right 08/17/2014   Procedure: CYSTOSCOPY/URETEROSCOPY//STENT PLACEMENT/ attempt of lithotripsy;  Surgeon: Dustin Gimenez, MD;  Location: [facility];  Service: Urology;  Laterality: Right;   HERNIA REPAIR     JOINT REPLACEMENT     Bilat knees and right hip   MOHS SURGERY     lip   TOTAL HIP ARTHROPLASTY Right 06/16/2020   Procedure: TOTAL HIP  ARTHROPLASTY ANTERIOR APPROACH;  Surgeon: Liliane Rei, MD;  Location: [facility];  Service: Orthopedics;  Laterality: Right;    TOTAL KNEE ARTHROPLASTY Left 04/24/2016   Procedure: LEFT TOTAL KNEE ARTHROPLASTY;  Surgeon: Liliane Rei, MD;  Location: [facility];  Service: Orthopedics;  Laterality: Left;  Adductor Block   TOTAL KNEE ARTHROPLASTY Right 10/23/2016   Procedure: RIGHT TOTAL KNEE ARTHROPLASTY;  Surgeon: Liliane Rei, MD;  Location: [facility];  Service: Orthopedics;  Laterality: Right;   URETEROSCOPY WITH HOLMIUM LASER LITHOTRIPSY Right 09/07/2014   Procedure: URETEROSCOPY WITH HOLMIUM LASER LITHOTRIPSY;  Surgeon: Dustin Gimenez, MD;  Location: [facility];  Service: Urology;  Laterality: Right;   VASECTOMY     Family History  Problem Relation Age of Onset   Hypertension Mother    Cancer Mother    Hyperlipidemia Mother    Arthritis Mother    Stroke Father    Heart disease Father    Obesity Father    Depression Sister    Hypertension Sister    Hyperlipidemia Sister    Learning  disabilities Sister    Obesity Sister    Kidney disease Maternal Grandmother    Social History   Socioeconomic History   Marital status: Married    Spouse name: Orelia Binet   Number of children: 1   Years of education: Not on file   Highest education level: Some college, no degree  Occupational History   Occupation: retired  Tobacco Use   Smoking status: Never   Smokeless tobacco: Never  Vaping Use   Vaping status: Never Used  Substance and Sexual Activity   Alcohol  use: No   Drug use: No   Sexual activity: Not Currently    Birth control/protection: None  Other Topics Concern   Not on file  Social History Narrative   Not on file   Social Drivers of Health   Financial Resource Strain: Low Risk  (07/27/2023)   Overall Financial Resource Strain (CARDIA)    Difficulty of Paying Living Expenses: Not very hard  Food Insecurity: No Food Insecurity (07/27/2023)   Hunger Vital Sign    Worried About Running Out of Food in the Last Year: Never true    Ran Out of Food in the Last Year: Never true  Transportation Needs: No Transportation Needs (07/27/2023)   PRAPARE - Administrator, Civil Service (Medical): No    Lack of Transportation (Non-Medical): No  Physical Activity: Insufficiently Active (07/27/2023)   Exercise Vital Sign    Days of Exercise per Week: 3 days    Minutes of Exercise per Session: 30 min  Stress: No Stress Concern Present (07/27/2023)   [employer] of Occupational Health - Occupational Stress Questionnaire    Feeling of Stress : Only a little  Social Connections: Moderately Isolated (07/27/2023)   Social Connection and Isolation Panel [NHANES]    Frequency of Communication with Friends and Family: Three times a week    Frequency of Social Gatherings with Friends and Family: Three times a week    Attends Religious Services: Never    Active Member of Clubs or Organizations: No    Attends Banker Meetings: Never    Marital Status:  Married    Tobacco Counseling Counseling given: Not Answered    Clinical Intake:  Pre-visit preparation completed: Yes  Pain : No/denies pain     BMI - recorded: 31 Nutritional Status: BMI > 30  Obese Nutritional Risks: None Diabetes: Yes CBG done?: No Did pt. bring  in CBG monitor from home?: No  Lab Results  Component Value Date   HGBA1C 7.2 (A) 01/25/2023   HGBA1C 8.2 (A) 11/20/2022   HGBA1C 8.3 (A) 10/25/2022     How often do you need to have someone help you when you read instructions, pamphlets, or other written materials from your doctor or pharmacy?: 1 - Never What is the last grade level you completed in school?: some college  Interpreter Needed?: No  Information entered by :: [employer]   Activities of Daily Living     07/23/2023   10:53 AM 02/20/2023   11:20 AM  In your present state of health, do you have any difficulty performing the following activities:  Hearing? 0 0  Vision? 1 0  Difficulty concentrating or making decisions? 0 0  Walking or climbing stairs? 1 1  Dressing or bathing? 0 0  Doing errands, shopping? 0 0  Preparing Food and eating ? N   Using the Toilet? N   In the past six months, have you accidently leaked urine? Y   Do you have problems with loss of bowel control? N   Managing your Medications? N   Managing your Finances? N   Housekeeping or managing your Housekeeping? N     Patient Care Team: Quinton Buckler, FNP as PCP - General (Nurse Practitioner) End, Veryl Gottron, MD as PCP - Cardiology (Cardiology) Rica Chalet, MD as Consulting Physician (Nephrology) End, Veryl Gottron, MD as Consulting Physician (Cardiology) Dustin Gimenez, MD as Consulting Physician (Urology) Elta Halter, MD (Dermatology) Alla Isaacs Severa Daniels, RPH-CPP as Pharmacist [facility]  Indicate any recent Medical Services you may have received from other than [facility] providers in the past year (date may be  approximate).     Assessment:   This is a routine wellness examination for Joseantonio.  Hearing/Vision screen Hearing Screening - Comments:: No difficulties  Vision Screening - Comments:: Wears glasses   Goals Addressed             This Visit's Progress    Patient Stated       Would like to get better health       Depression Screen     07/27/2023   10:27 AM 02/20/2023   11:21 AM 01/25/2023   11:10 AM 01/25/2023   10:20 AM 12/15/2022   10:20 AM 10/25/2022   10:44 AM 07/28/2022   10:20 AM  PHQ 2/9 Scores  PHQ - 2 Score 0 2 0 0 0 2 0  PHQ- 9 Score 1  0   2 0    Fall Risk     07/23/2023   10:53 AM 05/02/2023   10:53 AM 02/20/2023   11:20 AM 01/25/2023   10:20 AM 12/15/2022   10:20 AM  Fall Risk   Falls in the past year? 0 0 0 0 0  Number falls in past yr: 0 0 0 0 0  Injury with Fall? 0 0 0 0 0  Risk for fall due to : No Fall Risks  No Fall Risks No Fall Risks No Fall Risks  Follow up Falls evaluation completed Falls evaluation completed Falls prevention discussed Falls prevention discussed     MEDICARE RISK AT HOME:  Medicare Risk at Home Any stairs in or around the home?: (Patient-Rptd) No If so, are there any without handrails?: (Patient-Rptd) No Home free of loose throw rugs in walkways, pet beds, electrical cords, etc?: (Patient-Rptd) Yes Adequate lighting in your home to reduce risk of falls?: (  Patient-Rptd) Yes Life alert?: (Patient-Rptd) No Use of a cane, walker or w/c?: (Patient-Rptd) No Grab bars in the bathroom?: (Patient-Rptd) Yes Shower chair or bench in shower?: (Patient-Rptd) Yes Elevated toilet seat or a handicapped toilet?: (Patient-Rptd) Yes  TIMED UP AND GO:  Was the test performed?  No  Cognitive Function: 6CIT completed        07/27/2023   10:23 AM 03/14/2022    9:08 AM 09/24/2018    2:47 PM  6CIT Screen  What Year? 0 points 0 points 0 points  What month? 0 points 0 points 0 points  What time? 0 points 0 points 0 points  Count back  from 20 0 points 0 points 0 points  Months in reverse 0 points 0 points 0 points  Repeat phrase 0 points 0 points 0 points  Total Score 0 points 0 points 0 points    Immunizations Immunization History  Administered Date(s) Administered   Fluad Quad(high Dose 65+) 11/20/2018, 12/15/2019, 02/10/2021, 02/02/2022   Fluad Trivalent(High Dose 65+) 01/25/2023   Influenza, High Dose Seasonal PF 12/22/2015, 01/22/2017, 12/18/2017   Influenza, Seasonal, Injecte, Preservative Fre 12/18/2011   Influenza,inj,Quad PF,6+ Mos 01/06/2013, 12/05/2013, 01/06/2015   Influenza-Unspecified 12/02/2013   PFIZER(Purple Top)SARS-COV-2 Vaccination 06/05/2019, 06/26/2019, 03/03/2020   Pneumococcal Conjugate-13 09/17/2017   Pneumococcal Polysaccharide-23 07/20/2016   Tdap 06/05/2013    Screening Tests Health Maintenance  Topic Date Due   OPHTHALMOLOGY EXAM  06/17/2022   COVID-19 Vaccine (4 - 2024-25 season) 12/03/2022   FOOT EXAM  02/03/2023   DTaP/Tdap/Td (2 - Td or Tdap) 06/06/2023   HEMOGLOBIN A1C  07/26/2023   Zoster Vaccines- Shingrix (1 of 2) 07/31/2023 (Originally 11/16/1969)   Hepatitis C Screening  01/17/2024 (Originally 11/16/1968)   INFLUENZA VACCINE  11/02/2023   Diabetic kidney evaluation - Urine ACR  02/20/2024   Diabetic kidney evaluation - eGFR measurement  07/19/2024   Medicare Annual Wellness (AWV)  07/26/2024   Colonoscopy  10/26/2030   Pneumonia Vaccine 49+ Years old  Completed   HPV VACCINES  Aged Out   Meningococcal B Vaccine  Aged Out    Health Maintenance  Health Maintenance Due  Topic Date Due   OPHTHALMOLOGY EXAM  06/17/2022   COVID-19 Vaccine (4 - 2024-25 season) 12/03/2022   FOOT EXAM  02/03/2023   DTaP/Tdap/Td (2 - Td or Tdap) 06/06/2023   HEMOGLOBIN A1C  07/26/2023   Health Maintenance Items Addressed: A1C, foot exam ordered. Patient declined vaccinations, and states he is not due for an eye exam yet  Additional Screening:  Vision Screening: Recommended annual  ophthalmology exams for early detection of glaucoma and other disorders of the eye.  Dental Screening: Recommended annual dental exams for proper oral hygiene  Community Resource Referral / Chronic Care Management: CRR required this visit?  No   CCM required this visit?  No     Plan:     I have personally reviewed and noted the following in the patient's chart:   Medical and social history Use of alcohol , tobacco or illicit drugs  Current medications and supplements including opioid prescriptions. Patient is currently taking opioid prescriptions. Information provided to patient regarding non-opioid alternatives. Patient advised to discuss non-opioid treatment plan with their provider. Functional ability and status Nutritional status Physical activity Advanced directives List of other physicians Hospitalizations, surgeries, and ER visits in previous 12 months Vitals Screenings to include cognitive, depression, and falls Referrals and appointments  In addition, I have reviewed and discussed with patient certain preventive protocols, quality  metrics, and best practice recommendations. A written personalized care plan for preventive services as well as general preventive health recommendations were provided to patient.     Freeda Jerry, [place]   07/27/2023   After Visit Summary: (MyChart) Due to this being a telephonic visit, the after visit summary with patients personalized plan was offered to patient via MyChart   Notes: Nothing significant to report at this time.

## 2023-08-01 ENCOUNTER — Ambulatory Visit: Payer: Self-pay | Admitting: Nurse Practitioner

## 2023-08-01 ENCOUNTER — Encounter: Payer: Self-pay | Admitting: Nurse Practitioner

## 2023-08-01 VITALS — BP 138/76 | HR 93 | Temp 98.5°F | Resp 18 | Ht 75.0 in | Wt 253.0 lb

## 2023-08-01 DIAGNOSIS — M5136 Other intervertebral disc degeneration, lumbar region with discogenic back pain only: Secondary | ICD-10-CM

## 2023-08-01 DIAGNOSIS — I700 Atherosclerosis of aorta: Secondary | ICD-10-CM | POA: Diagnosis not present

## 2023-08-01 DIAGNOSIS — E1122 Type 2 diabetes mellitus with diabetic chronic kidney disease: Secondary | ICD-10-CM

## 2023-08-01 DIAGNOSIS — J301 Allergic rhinitis due to pollen: Secondary | ICD-10-CM

## 2023-08-01 DIAGNOSIS — M545 Low back pain, unspecified: Secondary | ICD-10-CM

## 2023-08-01 DIAGNOSIS — N400 Enlarged prostate without lower urinary tract symptoms: Secondary | ICD-10-CM

## 2023-08-01 DIAGNOSIS — G4733 Obstructive sleep apnea (adult) (pediatric): Secondary | ICD-10-CM | POA: Diagnosis not present

## 2023-08-01 DIAGNOSIS — I10 Essential (primary) hypertension: Secondary | ICD-10-CM | POA: Diagnosis not present

## 2023-08-01 DIAGNOSIS — E1169 Type 2 diabetes mellitus with other specified complication: Secondary | ICD-10-CM | POA: Diagnosis not present

## 2023-08-01 DIAGNOSIS — Z794 Long term (current) use of insulin: Secondary | ICD-10-CM

## 2023-08-01 DIAGNOSIS — N1831 Chronic kidney disease, stage 3a: Secondary | ICD-10-CM

## 2023-08-01 DIAGNOSIS — F1120 Opioid dependence, uncomplicated: Secondary | ICD-10-CM

## 2023-08-01 LAB — POCT GLYCOSYLATED HEMOGLOBIN (HGB A1C): Hemoglobin A1C: 8.1 % — AB (ref 4.0–5.6)

## 2023-08-01 MED ORDER — HYDROCODONE-ACETAMINOPHEN 10-325 MG PO TABS: 0.5000 | ORAL_TABLET | Freq: Three times a day (TID) | ORAL | 0 refills | Status: DC | PRN

## 2023-08-01 MED ORDER — NOVOLOG FLEXPEN 100 UNIT/ML ~~LOC~~ SOPN: PEN_INJECTOR | SUBCUTANEOUS | 11 refills | Status: DC

## 2023-08-01 NOTE — Progress Notes (Signed)
 BP 138/76   Pulse 93   Temp 98.5 F (36.9 C)   Resp 18   Ht 6\' 3"  (1.905 m)   Wt 253 lb (114.8 kg)   SpO2 94%   BMI 31.62 kg/m    Subjective:    Patient ID: Evan Rivas, male    DOB: 08/05/1950, 73 y.o.   MRN: [id number]  HPI: Evan Rivas is a 73 y.o. male  Chief Complaint  Patient presents with   Medical Management of Chronic Issues    Discussed the use of AI scribe software for clinical note transcription with the patient, who gave verbal consent to proceed.  History of Present Illness Evan Rivas is a 73 year old male with type 2 diabetes who presents for follow up.   He has been experiencing significant fluctuations in his blood sugar levels, with readings that drop and then spike. Recently, his blood sugar was 244 mg/dL despite not consuming a large meal. He is currently taking 20 units of insulin  daily at 11 PM, but this has not been sufficient to stabilize his levels. He has been documenting his food intake to better understand the fluctuations.  He is using his wife's OneTouch glucose meter due to the high cost of test strips, which he finds prohibitive. The test strips cost approximately twenty dollars per canister, creating a financial burden.  His A1c is currently 8.1%, indicating suboptimal control of his diabetes. He has been trying to manage his diet by cutting down on certain foods, such as chips, to help control his blood sugar levels.  He recently had an ultrasound to check for blood clots in his legs, which came back negative. He is scheduled to visit a vein clinic on May 16th for further evaluation of fluid retention in his legs, which may be related to venous reflux. He experiences burning in his feet after physical activity, such as going to the gym, which he resumed recently after receiving clearance.  He has a history of aortic atherosclerosis, hypertension, sleep apnea, allergic rhinitis, hyperlipidemia, internal disc disease, chronic  pain syndrome, continuous opioid dependence, chronic kidney disease, BPH, and morbid obesity. He is currently taking multiple medications including aspirin  81 mg daily, atorvastatin  40 mg daily, Farxiga  10 mg daily, Flonase  as needed, gabapentin  600 mg three times daily, glipizide  10 mg daily, Norco 10-325 mg every eight hours as needed for pain, Toujeo  12 units daily, metformin  1000 mg twice daily, metoprolol  50 mg daily, Ozempic 1 mg weekly, sildenafil  20 mg as needed, tizanidine  4 mg as needed, Torsemide 20 mg daily, and valsartan  40 mg daily.         07/27/2023   10:27 AM 02/20/2023   11:21 AM 01/25/2023   11:10 AM  Depression screen PHQ 2/9  Decreased Interest 0 1 0  Down, Depressed, Hopeless 0 1 0  PHQ - 2 Score 0 2 0  Altered sleeping 1  0  Tired, decreased energy 0  0  Change in appetite 0  0  Feeling bad or failure about yourself  0  0  Trouble concentrating 0  0  Moving slowly or fidgety/restless 0  0  Suicidal thoughts 0  0  PHQ-9 Score 1  0  Difficult doing work/chores Not difficult at all      Relevant past medical, surgical, family and social history reviewed and updated as indicated. Interim medical history since our last visit reviewed. Allergies and medications reviewed and updated.  Review of Systems  Constitutional:  Negative for fever or weight change.  Respiratory: Negative for cough and shortness of breath.   Cardiovascular: Negative for chest pain or palpitations.  Gastrointestinal: Negative for abdominal pain, no bowel changes.  Musculoskeletal: Negative for gait problem or joint swelling.  Skin: Negative for rash.  Neurological: Negative for dizziness or headache.  No other specific complaints in a complete review of systems (except as listed in HPI above).      Objective:    BP 138/76   Pulse 93   Temp 98.5 F (36.9 C)   Resp 18   Ht 6\' 3"  (1.905 m)   Wt 253 lb (114.8 kg)   SpO2 94%   BMI 31.62 kg/m    Wt Readings from Last 3 Encounters:   08/01/23 253 lb (114.8 kg)  07/27/23 248 lb (112.5 kg)  07/20/23 251 lb 12.8 oz (114.2 kg)    Physical Exam Physical Exam VITALS: BP- 138/76 GENERAL: Alert, cooperative, well developed, no acute distress HEENT: Normocephalic, normal oropharynx, moist mucous membranes CHEST: Clear to auscultation bilaterally, no wheezes, rhonchi, or crackles CARDIOVASCULAR: Normal heart rate and rhythm, S1 and S2 normal without murmurs ABDOMEN: Soft, non-tender, non-distended, without organomegaly, normal bowel sounds EXTREMITIES: No cyanosis, 1+ pitting edema NEUROLOGICAL: Cranial nerves grossly intact, moves all extremities without gross motor or sensory deficit   Results for orders placed or performed in visit on 08/01/23  POCT HgB A1C   Collection Time: 08/01/23 11:09 AM  Result Value Ref Range   Hemoglobin A1C 8.1 (A) 4.0 - 5.6 %   HbA1c POC (<> result, manual entry)     HbA1c, POC (prediabetic range)     HbA1c, POC (controlled diabetic range)         Assessment & Plan:   Problem List Items Addressed This Visit       Cardiovascular and Mediastinum   Essential hypertension   Aortic atherosclerosis (HCC) - Primary     Respiratory   Allergic rhinitis   OSA (obstructive sleep apnea)     Endocrine   Hyperlipidemia associated with type 2 diabetes mellitus (HCC)   Relevant Medications   insulin  aspart (NOVOLOG  FLEXPEN) 100 UNIT/ML FlexPen   Type 2 diabetes mellitus without complication, without long-term current use of insulin  (HCC)   Relevant Medications   insulin  aspart (NOVOLOG  FLEXPEN) 100 UNIT/ML FlexPen     Musculoskeletal and Integument   DDD (degenerative disc disease), lumbar   Relevant Medications   HYDROcodone -acetaminophen  (NORCO) 10-325 MG tablet (Start on 09/30/2023)   HYDROcodone -acetaminophen  (NORCO) 10-325 MG tablet (Start on 08/31/2023)   HYDROcodone -acetaminophen  (NORCO) 10-325 MG tablet     Genitourinary   BPH without urinary obstruction   Stage 3a chronic  kidney disease (HCC)     Other   Continuous opioid dependence (HCC)   Relevant Medications   HYDROcodone -acetaminophen  (NORCO) 10-325 MG tablet (Start on 09/30/2023)   HYDROcodone -acetaminophen  (NORCO) 10-325 MG tablet (Start on 08/31/2023)   HYDROcodone -acetaminophen  (NORCO) 10-325 MG tablet   Morbid obesity (HCC)   Relevant Medications   insulin  aspart (NOVOLOG  FLEXPEN) 100 UNIT/ML FlexPen   Chronic low back pain   Relevant Medications   HYDROcodone -acetaminophen  (NORCO) 10-325 MG tablet (Start on 09/30/2023)   HYDROcodone -acetaminophen  (NORCO) 10-325 MG tablet (Start on 08/31/2023)   HYDROcodone -acetaminophen  (NORCO) 10-325 MG tablet     Assessment and Plan Assessment & Plan Type 2 diabetes mellitus with hyperglycemia Blood glucose levels are inadequately controlled with current regimen of Toujeo , metformin , glipizide , and Ozempic. Hemoglobin A1c is 8.1%. He experiences financial constraints  affecting affordability of test strips and medications. Continuous glucose monitoring was discussed but not pursued due to cost. - Add short-acting insulin  with meals based on pre-meal blood glucose levels. - Provide insulin  dosing guide based on blood glucose readings. - Send prescription for short-acting insulin  . - Consider switching Toujeo  to [place] if cost issues persist. - Follow up in three months to reassess diabetes management.  Hypertension Blood pressure is 138/76 mmHg, well-managed with metoprolol  and valsartan . - Continue current antihypertensive regimen with metoprolol  and valsartan .  Aortic atherosclerosis/HLD -continue taking atorvastatin  40 mg daily  Obesity -comorobidities include HLD, HTN, OSA, DM -continue working on lifestyle modification  Allergic rhinitis -continue using flonase   Ckd -continue working on getting better glycemic control  Bph -stable no changes   Chronic pain syndrome/DDD/chronic low back pain Managed with Norco and gabapentin . Reports  burning sensation in feet post-activity, possibly neuropathic or vascular. Scheduled for vascular test on May 15th to assess for vascular insufficiency. - Send refill for Norco to [facility] for the next three months. - Continue gabapentin  as prescribed. - Await results of vascular testing on May 15th to evaluate potential vascular insufficiency contributing to pain.       Pre-meal insulin  :  Check fsbs prior to meals  Hold if below 110 If glucose 110- 140 give 4 units If glucose 140 to 180 give 6 units  If glucose 180 to 220  give 8 units If glucose above 220 give 10 units   Follow up plan: Return in about 3 months (around 10/31/2023) for follow up.

## 2023-08-01 NOTE — Patient Instructions (Signed)
Pre-meal insulin :  Check fsbs prior to meals  Hold if below 110 If glucose 110- 140 give 4 units If glucose 140 to 180 give 6 units  If glucose 180 to 220  give 8 units If glucose above 220 give 10 units

## 2023-08-02 DIAGNOSIS — E119 Type 2 diabetes mellitus without complications: Secondary | ICD-10-CM | POA: Diagnosis not present

## 2023-08-03 ENCOUNTER — Encounter: Payer: Self-pay | Admitting: Nurse Practitioner

## 2023-08-06 ENCOUNTER — Other Ambulatory Visit: Payer: Self-pay | Admitting: Pharmacist

## 2023-08-06 DIAGNOSIS — E1165 Type 2 diabetes mellitus with hyperglycemia: Secondary | ICD-10-CM

## 2023-08-06 NOTE — Progress Notes (Signed)
   08/06/2023  Patient ID: Evan Rivas, male   DOB: 02/19/1951, 73 y.o.   MRN: [id number]  Receive a message from PCP requesting outreach to patient for assistance with cost of his diabetes testing supplies.  Reach patient who advises that he currently uses a True Metrix glucometer and that the test strips for his meter cost ~$20 per 50 count container. Reports that he would like to reduce the cost for his testing supplies, but prefers to not change his glucometer. Patient currently purchasing his test strips from [facility] as recalls he was previously advised by his [employer] he would need to change to a new meter brand in order to have his testing supplies covered through insurance.  Counsel patient on some options for obtaining his True Metrix test strips for lower cost.   Patient states that he will contact his insurance tomorrow to find out exactly what his cost would be if switched to an alternative brand of glucometer and testing supplies and will call Clinical Pharmacist/office back if would like to request a new prescription.  Arthur Lash, PharmD, [facility] [phone]

## 2023-08-10 ENCOUNTER — Encounter: Payer: Self-pay | Admitting: Nurse Practitioner

## 2023-08-10 ENCOUNTER — Other Ambulatory Visit: Payer: Self-pay | Admitting: Nurse Practitioner

## 2023-08-10 DIAGNOSIS — E1122 Type 2 diabetes mellitus with diabetic chronic kidney disease: Secondary | ICD-10-CM

## 2023-08-10 MED ORDER — TRESIBA FLEXTOUCH 100 UNIT/ML ~~LOC~~ SOPN: 20.0000 [IU] | PEN_INJECTOR | Freq: Every day | SUBCUTANEOUS | Status: DC

## 2023-08-16 ENCOUNTER — Ambulatory Visit (HOSPITAL_COMMUNITY)
Admission: RE | Admit: 2023-08-16 | Discharge: 2023-08-16 | Disposition: A | Discharge status: Home / Self Care | Source: Ambulatory Visit | Attending: Physician Assistant | Admitting: Physician Assistant

## 2023-08-16 DIAGNOSIS — R600 Localized edema: Secondary | ICD-10-CM | POA: Diagnosis not present

## 2023-08-20 ENCOUNTER — Ambulatory Visit: Payer: Self-pay | Admitting: Physician Assistant

## 2023-08-20 DIAGNOSIS — R600 Localized edema: Secondary | ICD-10-CM

## 2023-08-21 DIAGNOSIS — K080 Exfoliation of teeth due to systemic causes: Secondary | ICD-10-CM | POA: Diagnosis not present

## 2023-08-22 ENCOUNTER — Ambulatory Visit: Attending: Nurse Practitioner | Admitting: Nurse Practitioner

## 2023-08-22 ENCOUNTER — Encounter: Payer: Self-pay | Admitting: Nurse Practitioner

## 2023-08-22 ENCOUNTER — Ambulatory Visit: Payer: Medicare Other | Admitting: Dermatology

## 2023-08-22 VITALS — BP 170/100 | HR 74 | Ht 75.0 in | Wt 259.8 lb

## 2023-08-22 DIAGNOSIS — I890 Lymphedema, not elsewhere classified: Secondary | ICD-10-CM | POA: Diagnosis not present

## 2023-08-22 DIAGNOSIS — I872 Venous insufficiency (chronic) (peripheral): Secondary | ICD-10-CM

## 2023-08-22 DIAGNOSIS — E785 Hyperlipidemia, unspecified: Secondary | ICD-10-CM

## 2023-08-22 DIAGNOSIS — N1832 Chronic kidney disease, stage 3b: Secondary | ICD-10-CM

## 2023-08-22 DIAGNOSIS — N1831 Chronic kidney disease, stage 3a: Secondary | ICD-10-CM

## 2023-08-22 DIAGNOSIS — I152 Hypertension secondary to endocrine disorders: Secondary | ICD-10-CM | POA: Diagnosis not present

## 2023-08-22 DIAGNOSIS — G4733 Obstructive sleep apnea (adult) (pediatric): Secondary | ICD-10-CM

## 2023-08-22 DIAGNOSIS — E1122 Type 2 diabetes mellitus with diabetic chronic kidney disease: Secondary | ICD-10-CM

## 2023-08-22 DIAGNOSIS — R55 Syncope and collapse: Secondary | ICD-10-CM

## 2023-08-22 DIAGNOSIS — I700 Atherosclerosis of aorta: Secondary | ICD-10-CM

## 2023-08-22 MED ORDER — TORSEMIDE 20 MG PO TABS: ORAL_TABLET | ORAL | 3 refills | Status: DC

## 2023-08-22 NOTE — Progress Notes (Signed)
 Office Visit    Patient Name: Evan Rivas Date of Encounter: 08/22/2023  Primary Care Provider:  Quinton Buckler, FNP Primary Cardiologist:  Sammy Crisp, MD  Chief Complaint    73 y.o. male with history of diastolic dysfunction, hyperaldosteronism secondary to adrenal hyperplasia, DM2, HTN, HLD, cancer of the lip, gout, anxiety, depression, OSA intolerant to CPAP, and arthritis with chronic pain who presents for follow-up of lower extremity swelling.   Past Medical History   Subjective   Past Medical History:  Diagnosis Date   Adrenal hyperplasia (HCC)    Anxiety    panic attacks   Arthritis    Basal cell carcinoma 2015   R upper lip - tx with MOHS by Dr. Debrah Fan   Basal cell carcinoma 08/25/2021   BCC, nasal tip, Mohs completed on 09/30/21   CKD (chronic kidney disease), stage III (HCC)    Depression    Diastolic dysfunction    a.02/2018 Echo: LVEF 60-65%, mild LVH, G2DD, mild MR, mildly dilated LA, RV cavity size/wall thickness/systolic function normal; b. 01/2023 Echo: EF 60-65%, no rwma, GrI DD, nl RV fxn, no significant valvular disease.   GERD (gastroesophageal reflux disease)    Gout    History of kidney stones    History of stress test    a. 01/2023 Lexiscan  PET Stress: EF 68% without evidence of ischemia or infarct.  No significant coronary calcium .   Hyperaldosteronism (HCC)    a.) secondary to BILATERAL adrenal hyperplasia   Hyperlipidemia    Hypertension    Nerve entrapment syndrome of lower extremity, right 10/24/2019   Pneumonia    2014 and 2018 ( 2018-klebsiella pneumonia    PSVT (paroxysmal supraventricular tachycardia) (HCC)    a. 02/2023 Zio: 9 SVT runs lasting up to 2 minutes and 51 seconds at a maximal rate of 148 bpm.   Sleep apnea    Syncope    a. 02/2023 Zio: Predominantly normal rhythm, average 77 (55-100).  Occasional PACs (1.1% burden).  9 SVT runs, longest 2 minutes and 51 seconds at a maximum rate of 148.  Intermittent Mobitz 1.   No prolonged pauses.  No triggered events.   T2DM (type 2 diabetes mellitus) (HCC)    Venous reflux    a. 08/2023 LE Venous duplex: No DVT.  Mild bilateral venous insufficiency in the greater saphenous veins in the calves.  Venous reflux noted in the left short saphenous vein.   Past Surgical History:  Procedure Laterality Date   ARTHROSCOPIC REPAIR ACL Left    CIRCUMCISION N/A 01/17/2021   Procedure: CIRCUMCISION ADULT;  Surgeon: Dustin Gimenez, MD;  Location: [facility];  Service: Urology;  Laterality: N/A;   CYSTOSCOPY W/ URETERAL STENT PLACEMENT Right 09/07/2014   Procedure: CYSTOSCOPY WITH STENT REPLACEMENT;  Surgeon: Dustin Gimenez, MD;  Location: [facility];  Service: Urology;  Laterality: Right;   CYSTOSCOPY WITH INSERTION OF UROLIFT N/A 11/24/2019   Procedure: CYSTOSCOPY WITH INSERTION OF UROLIFT;  Surgeon: Dustin Gimenez, MD;  Location: [facility];  Service: Urology;  Laterality: N/A;   CYSTOSCOPY/URETEROSCOPY/HOLMIUM LASER/STENT PLACEMENT Right 08/17/2014   Procedure: CYSTOSCOPY/URETEROSCOPY//STENT PLACEMENT/ attempt of lithotripsy;  Surgeon: Dustin Gimenez, MD;  Location: [facility];  Service: Urology;  Laterality: Right;   HERNIA REPAIR     JOINT REPLACEMENT     Bilat knees and right hip   MOHS SURGERY     lip   TOTAL HIP ARTHROPLASTY Right 06/16/2020   Procedure: TOTAL HIP ARTHROPLASTY ANTERIOR APPROACH;  Surgeon: Liliane Rei,  MD;  Location: [facility];  Service: Orthopedics;  Laterality: Right;    TOTAL KNEE ARTHROPLASTY Left 04/24/2016   Procedure: LEFT TOTAL KNEE ARTHROPLASTY;  Surgeon: Liliane Rei, MD;  Location: [facility];  Service: Orthopedics;  Laterality: Left;  Adductor Block   TOTAL KNEE ARTHROPLASTY Right 10/23/2016   Procedure: RIGHT TOTAL KNEE ARTHROPLASTY;  Surgeon: Liliane Rei, MD;  Location: [facility];  Service: Orthopedics;  Laterality: Right;   URETEROSCOPY WITH HOLMIUM LASER LITHOTRIPSY Right 09/07/2014   Procedure: URETEROSCOPY WITH HOLMIUM LASER  LITHOTRIPSY;  Surgeon: Dustin Gimenez, MD;  Location: [facility];  Service: Urology;  Laterality: Right;   VASECTOMY      Allergies  Allergies  Allergen Reactions   Bee Venom Other (See Comments)   Amlodipine      Ankle swelling   Celebrex [Celecoxib]     GI issues   Tetanus Antitoxin Other (See Comments)   Tetanus Toxoids Other (See Comments)    Unkown   Tetanus-Diphtheria Toxoids Td     Unknown       History of Present Illness      73 y.o. y/o male with history of diastolic dysfunction, hyperaldosteronism secondary to adrenal hyperplasia, DM2, HTN, HLD, cancer of the lip, gout, anxiety, depression, OSA intolerant to CPAP, and arthritis.  Prior echo in 2019 showed an EF of 60 to 65% with mild LVH, normal RV function, grade 2 diastolic dysfunction, and no significant valvular disease.  Renal artery duplex in December 2019 showed no significant renal artery stenosis.  In June 2023, he was seen in the emergency department due to near syncope felt to be due to poor p.o. intake.  ED workup was unremarkable.  In October 2024, he was seen in the office with progressive fatigue, weakness, and dyspnea.  He also reported episodic syncope.  Blood pressure was soft and his valsartan -HCTZ dose was halved.  Follow-up lab work suggested AKI and hyperkalemia.  He was subsequently seen in the emergency department for repeat labs with improved potassium and improving serum creatinine.  HCTZ was discontinued and he was maintained on valsartan  40 mg daily and metoprolol  50 mg daily.  Echocardiogram showed normal LV function with grade 1 diastolic dysfunction and no significant valvular disease.  Lexiscan  PET stress showed normal LV function without ischemia or infarct.  Further, no significant coronary calcium  was noted.  Event monitoring in 02/2023 showed predominantly sinus rhythm with 1.1% PAC burden, rare PVCs, and 9 SVT runs lasting up to 2 minutes and 51 seconds with a maximum rate of 148 bpm.  Mobitz 1  heart block was noted without any significant pauses.   Evan Rivas was last seen in 07/2023, w/ ongoing lower ext swelling managed w/ torsemide 20 daily.  He subsequently underwent lower extremity venous duplex which was negative for DVT or deep venous insufficiency but did show bilateral saphenous vein reflux.  Patient wished to discuss this result further as he is interested in vascular surgery referral.  He is very frustrated with the management of his lymphedema and is disappointed that despite taking torsemide, wearing compression, and keeping his legs elevated as much as possible, he continues to have 2+ bilateral lower extremity edema.  He does admit to frequent consumption of processed and fast foods, which does not plan on changing.  He does not experience chest pain and denies dyspnea, palpitations, PND, orthopnea, dizziness, any recurrence of syncope, or early satiety.  He has noted some increase in abdominal girth which is intermittent and associated with fluctuations  in his weight. Objective   Home Medications    Current Outpatient Medications  Medication Sig Dispense Refill   aspirin  EC 81 MG tablet Take 81 mg by mouth daily. Swallow whole.     atorvastatin  (LIPITOR) 40 MG tablet Take 1 tablet (40 mg total) by mouth at bedtime. 90 tablet 1   Cholecalciferol 50 MCG (2000 UT) TABS Take 1 tablet by mouth daily.     clotrimazole  (LOTRIMIN ) 1 % cream Apply 1 application topically 2 (two) times daily. Apply for 7-10 days. 28 g 1   dapagliflozin  propanediol (FARXIGA ) 10 MG TABS tablet Take 1 tablet (10 mg total) by mouth daily before breakfast. Patient receives via [place] Patient [employer] [place] 90 tablet 3   fluticasone  (FLONASE ) 50 MCG/ACT nasal spray USE 2 SPRAYS IN EACH NOSTRIL DAILY 16 g 0   gabapentin  (NEURONTIN ) 600 MG tablet Take 1 tablet (600 mg total) by mouth 3 (three) times daily. 270 tablet 1   glipiZIDE  (GLUCOTROL ) 10 MG tablet Take 1 tablet (10 mg total) by mouth daily before  breakfast. 90 tablet 3   glucose blood (TRUE METRIX BLOOD GLUCOSE TEST) test strip Test  blood sugar twice a day 100 each 12   [START ON 09/30/2023] HYDROcodone -acetaminophen  (NORCO) 10-325 MG tablet Take 0.5-1 tablets by mouth every 8 (eight) hours as needed for severe pain (pain score 7-10). Gradually reducing dose 28 tablet 0   [START ON 08/31/2023] HYDROcodone -acetaminophen  (NORCO) 10-325 MG tablet Take 0.5-1 tablets by mouth every 8 (eight) hours as needed for severe pain (pain score 7-10). Gradually reducing dose 28 tablet 0   HYDROcodone -acetaminophen  (NORCO) 10-325 MG tablet Take 0.5-1 tablets by mouth every 8 (eight) hours as needed for severe pain (pain score 7-10). Gradually reducing dose 28 tablet 0   insulin  aspart (NOVOLOG  FLEXPEN) 100 UNIT/ML FlexPen per sliding scale three times daily with meals   Pre-meal insulin  : Check fsbs prior to meals Hold if below 110 If glucose 110- 140 give 4 units If glucose 140 to 180 give 6 units If glucose 180 to 220  give 8 units If glucose above 220 give 10 units 15 mL 11   insulin  degludec (TRESIBA  FLEXTOUCH) 100 UNIT/ML FlexTouch Pen Inject 20 Units into the skin daily. Total daily dose = 0.2-0.5 units/kg/day---> 50% Basal/50% Bolus     ketoconazole  (NIZORAL ) 2 % cream Apply to the feet QHS. 60 g 6   melatonin 3 MG TABS tablet Take 3 mg by mouth at bedtime.     metFORMIN  (GLUCOPHAGE ) 1000 MG tablet TAKE 1 TABLET TWICE A DAY WITH A MEAL 180 tablet 3   metoprolol  succinate (TOPROL -XL) 50 MG 24 hr tablet Take 1 tablet (50 mg total) by mouth daily. Take with or immediately following a meal. 90 tablet 3   Multiple Vitamin (MULTIVITAMIN) tablet Take 1 tablet by mouth daily.     Semaglutide (OZEMPIC, 1 MG/DOSE, [place]) Inject 1 mg into the skin once a week.     sildenafil  (REVATIO ) 20 MG tablet Take 1 tablet (20 mg total) by mouth as needed. Take 1-5 tabs as needed prior to intercourse 30 tablet 11   tiZANidine  (ZANAFLEX ) 4 MG tablet TAKE 1 TABLET EVERY 6 HOURS AS  NEEDED FOR MUSCLE SPASMS 270 tablet 4   valsartan  (DIOVAN ) 40 MG tablet Take 1 tablet (40 mg total) by mouth daily. 90 tablet 3   torsemide (DEMADEX) 20 MG tablet 1 tab daily with 1 additional tab every MWF 130 tablet 3   No current facility-administered  medications for this visit.     Physical Exam    VS:  BP (!) 170/100   Pulse 74   Ht 6\' 3"  (1.905 m)   Wt 259 lb 12.8 oz (117.8 kg)   SpO2 96%   BMI 32.47 kg/m  , BMI Body mass index is 32.47 kg/m.       GEN: Obese, in no acute distress. HEENT: normal. Neck: Supple, obese, difficult to gauge JVP.  No bruits or masses. Cardiac: RRR, no murmurs, rubs, or gallops. No clubbing, cyanosis, 2+ bilateral lower extremity edema to the knees.  Radials 2+/PT 2+ and equal bilaterally.  Respiratory:  Respirations regular and unlabored, clear to auscultation bilaterally. GI: Obese, semifirm and protuberant, nontender, BS + x 4. MS: no deformity or atrophy. Skin: warm and dry, no rash. Neuro:  Strength and sensation are intact. Psych: Normal affect.  Accessory Clinical Findings    ECG personally reviewed by me today - EKG Interpretation Date/Time:  Wednesday Aug 22 2023 11:21:13 EDT Ventricular Rate:  74 PR Interval:    QRS Duration:  78 QT Interval:  392 QTC Calculation: 435 R Axis:   12  Text Interpretation: Normal sinus rhythm First degree A-V block Mobitz I 2-degree AV block (Wenckebach block) Low voltage QRS Cannot rule out Anterior infarct , age undetermined Confirmed by Laneta Pintos [phone]) on 08/22/2023 11:55:06 AM  - no acute changes.  Lab Results  Component Value Date   WBC 5.8 01/03/2023   HGB 15.0 01/03/2023   HCT 44.3 01/03/2023   MCV 100 (H) 01/03/2023   PLT 237 01/03/2023   Lab Results  Component Value Date   CREATININE 1.46 (H) 07/20/2023   BUN 23 07/20/2023   NA 145 (H) 07/20/2023   K 4.5 07/20/2023   CL 104 07/20/2023   CO2 23 07/20/2023   Lab Results  Component Value Date   ALT 29 01/04/2023    AST 19 01/04/2023   ALKPHOS 73 01/04/2023   BILITOT 0.8 01/04/2023   Lab Results  Component Value Date   CHOL 136 07/28/2022   HDL 37 (L) 07/28/2022   LDLCALC 70 07/28/2022   TRIG 232 (H) 07/28/2022   CHOLHDL 3.7 07/28/2022    Lab Results  Component Value Date   HGBA1C 8.1 (A) 08/01/2023   Lab Results  Component Value Date   TSH 1.880 01/03/2023       Assessment & Plan    1.  Lymphedema/venous reflux: Patient with ongoing lower extremity swelling over the past year with echo in the fall 2024 showing normal LV function with grade 1 diastolic dysfunction without significant valvular disease.  He has been managed with torsemide 20 mg daily and also reports compliance with pression socks to try and keep his legs elevated.  Despite this, he is very frustrated that he continues to have 2+ bilateral lower extremity edema which has been somewhat lifestyle limiting, as he has been reluctant to play golf out of concern that his legs might hurt.  Recent lower extremity duplex without DVT or deep venous insufficiency though notable for mild saphenous vein reflux bilaterally.  Patient is interested in vascular surgery referral to discuss additional options such as lymphedema pumps and order has been placed today.  As noted, he does have 2+ edema on exam and admits to a diet that is heavy and processed and fast foods.  We discussed his diet today though he made it clear that he was not planning to alter his diet.  We agreed  to increase his torsemide to 20 mg daily with an additional 20 mg on Mondays, Wednesdays, and Fridays.  Follow-up basic metabolic panel in 1 week.  2.  Hypertension in the setting of hyperaldosteronism: Blood pressure elevated today.  Adjusting diuretics as outlined above.  He is otherwise managed with beta-blocker and ARB therapy.  Not a candidate for spironolactone  secondary to hyperkalemia in the past.  Previously on amlodipine  which was discontinued secondary to lower extremity  swelling.  If necessary in the future, could consider transitioning from metoprolol  to carvedilol though will need close monitoring in the setting of known first-degree AV block and intermittent Mobitz 1.  3.  History of syncope: Occurred while playing golf in the fall 2024.  No recurrence.  Echo, Lexiscan  PET/CT, and Zio were reassuring.  No recurrent symptoms.  4.  Stage IIIa chronic kidney disease: Follow-up basic metabolic panel next week in the setting of adjusting diuretic therapy.  5.  Obstructive sleep apnea: Intolerant to CPAP.  Consider referral to sleep medicine in the future.  6.  Aortic atherosclerosis/hyperlipidemia: LDL of 70 in April 2024 with normal LFTs in October 2024.  He remains on atorvastatin  therapy.  7.  Type 2 diabetes mellitus: A1c 8.1 earlier this year.  He is on glipizide , NovoLog , Tresiba , and semaglutide.  He is also on ARB and statin.  Management per primary care.  8.  Morbid obesity: On semaglutide but not losing weight.  Not actively watching his calories or regular exercising.  9.  Disposition: Follow-up basic metabolic panel in 1 week.  Refer to vascular surgery.  Follow-up in clinic in 2 months or sooner if necessary.    Laneta Pintos, NP 08/22/2023, 12:53 PM

## 2023-08-22 NOTE — Patient Instructions (Signed)
 Medication Instructions:  TORSEMIDE: Take one tablet daily and then an extra one on Monday, Wednesday, and Friday *If you need a refill on your cardiac medications before your next appointment, please call your pharmacy*  Lab Work: Your provider would like for you to return in one week to have the following labs drawn: BMET.   Please go to [facility] [address] ([facility] [address], [place] [address] You do not need an appointment.  They are open from 8 am- 4:30 pm.  Lunch from 1:00 pm- 2:00 pm You will not need to be fasting.   You may also go to one of the following [facility]:  [address] [place], [place] [address] Phone: [phone] Lab hours: Mon-Fri 8 am- 5 pm    Lunch 12 pm- 1 pm  [address] [place],  [place]  [address]  [place]  Phone: [phone] Lab hours: 7 am- 4 pm Lunch 12 pm-1 pm   [address] [place],  [place]  [address]  [place]  Phone: [phone] Lab hours: Mon-Fri 8 am- 5 pm    Lunch 12 pm- 1 pm  If you have labs (blood work) drawn today and your tests are completely normal, you will receive your results only by: MyChart Message (if you have MyChart) OR A paper copy in the mail If you have any lab test that is abnormal or we need to change your treatment, we will call you to review the results.   Follow-Up: At [facility], you and your health needs are our priority.  As part of our continuing mission to provide you with exceptional heart care, our providers are all part of one team.  This team includes your primary Cardiologist (physician) and Advanced Practice Providers or APPs (Physician Assistants and Nurse Practitioners) who all work together to provide you with the care you need, when you need it.  Your next appointment:   2 month(s)  Provider:   Varney Gentleman, PA-C    A referral has been placed to [facility]  We recommend signing up for the patient portal called "MyChart".  Sign up information is provided on  this After Visit Summary.  MyChart is used to connect with patients for Virtual Visits (Telemedicine).  Patients are able to view lab/test results, encounter notes, upcoming appointments, etc.  Non-urgent messages can be sent to your provider as well.   To learn more about what you can do with MyChart, go to [email or web address].

## 2023-08-28 ENCOUNTER — Encounter (INDEPENDENT_AMBULATORY_CARE_PROVIDER_SITE_OTHER): Payer: Self-pay | Admitting: Vascular Surgery

## 2023-08-28 ENCOUNTER — Ambulatory Visit (INDEPENDENT_AMBULATORY_CARE_PROVIDER_SITE_OTHER): Payer: Self-pay | Admitting: Vascular Surgery

## 2023-08-28 VITALS — BP 190/110 | HR 58 | Resp 18 | Ht 75.0 in | Wt 257.0 lb

## 2023-08-28 DIAGNOSIS — E785 Hyperlipidemia, unspecified: Secondary | ICD-10-CM | POA: Diagnosis not present

## 2023-08-28 DIAGNOSIS — I10 Essential (primary) hypertension: Secondary | ICD-10-CM | POA: Diagnosis not present

## 2023-08-28 DIAGNOSIS — E1169 Type 2 diabetes mellitus with other specified complication: Secondary | ICD-10-CM | POA: Diagnosis not present

## 2023-08-28 DIAGNOSIS — M7989 Other specified soft tissue disorders: Secondary | ICD-10-CM

## 2023-08-28 DIAGNOSIS — I890 Lymphedema, not elsewhere classified: Secondary | ICD-10-CM | POA: Diagnosis not present

## 2023-08-30 ENCOUNTER — Encounter (INDEPENDENT_AMBULATORY_CARE_PROVIDER_SITE_OTHER): Payer: Self-pay | Admitting: Vascular Surgery

## 2023-08-30 NOTE — Progress Notes (Signed)
 Subjective:    Patient ID: Evan Rivas, male    DOB: [birth year], 73 y.o.   MRN: [id number] Chief Complaint  Patient presents with   New Patient (Initial Visit)    np. consult. leg edema  ref: Evan, Rivas is a 73 yo male who presents to clinic today with complaints of bilateral lower extremity swelling/lymphedema.  Patient endorses that his legs have been swollen for a while but over the past couple of months since they changed some of his cardiac medicines such as metoprolol  by decreasing the dose he feels his lower extremities have gotten more swollen.  He was also endorses he was placed on insulin  for better glucose control and believes that his last hemoglobin A1c was 8.6.  He endorses he is working hard to control his blood sugars.  He endorses he goes to the gym 5 times a week.  His wife who was at his side today expresses that the patient stays up all night mostly to 5 AM and then sleeps throughout the day.  She is concerned that this may be affecting his diet, blood sugars, and the swelling to his legs.  Patient endorses he is wearing a pair of compression socks but they are a couple of years old.    Review of Systems  Constitutional: Negative.   Cardiovascular:  Positive for leg swelling.       Long history of hypertension on blood pressure medications.  Endocrine:       Longstanding history of diabetes mellitus 2 now with insulin  coverage.  Musculoskeletal:        History of bilateral knee replacements.  Psychiatric/Behavioral:  Positive for sleep disturbance. The patient is nervous/anxious.        Patient with a history of anxiety  All other systems reviewed and are negative.      Objective:    Physical Exam Vitals reviewed.  Constitutional:      Appearance: Normal appearance. He is obese.  HENT:     Head: Normocephalic.  Eyes:     Pupils: Pupils are equal, round, and reactive to light.  Cardiovascular:     Rate and Rhythm: Normal rate and  regular rhythm.     Pulses: Normal pulses.     Heart sounds: Normal heart sounds.  Pulmonary:     Effort: Pulmonary effort is normal.     Breath sounds: Normal breath sounds.  Abdominal:     General: Abdomen is flat. Bowel sounds are normal.     Palpations: Abdomen is soft.  Musculoskeletal:        General: Swelling present.     Right lower leg: Edema present.     Left lower leg: Edema present.  Skin:    General: Skin is warm and dry.     Capillary Refill: Capillary refill takes 2 to 3 seconds.  Neurological:     General: No focal deficit present.     Mental Status: He is alert and oriented to person, place, and time. Mental status is at baseline.  Psychiatric:        Mood and Affect: Mood normal.        Behavior: Behavior normal.        Thought Content: Thought content normal.        Judgment: Judgment normal.     BP (!) 190/110   Pulse (!) 58   Resp 18   Ht 6\' 3"  (1.905 m)   Wt 257 lb (116.6  kg)   BMI 32.12 kg/m   Past Medical History:  Diagnosis Date   Adrenal hyperplasia (HCC)    Anxiety    panic attacks   Arthritis    Basal cell carcinoma 2015   R upper lip - tx with MOHS by Dr. Debrah Fan   Basal cell carcinoma 08/25/2021   BCC, nasal tip, Mohs completed on 09/30/21   CKD (chronic kidney disease), stage III (HCC)    Depression    Diastolic dysfunction    a.02/2018 Echo: LVEF 60-65%, mild LVH, G2DD, mild MR, mildly dilated LA, RV cavity size/wall thickness/systolic function normal; b. 01/2023 Echo: EF 60-65%, no rwma, GrI DD, nl RV fxn, no significant valvular disease.   GERD (gastroesophageal reflux disease)    Gout    History of kidney stones    History of stress test    a. 01/2023 Lexiscan  PET Stress: EF 68% without evidence of ischemia or infarct.  No significant coronary calcium .   Hyperaldosteronism (HCC)    a.) secondary to BILATERAL adrenal hyperplasia   Hyperlipidemia    Hypertension    Nerve entrapment syndrome of lower extremity, right 10/24/2019    Pneumonia    2014 and 2018 ( 2018-klebsiella pneumonia    PSVT (paroxysmal supraventricular tachycardia) (HCC)    a. 02/2023 Zio: 9 SVT runs lasting up to 2 minutes and 51 seconds at a maximal rate of 148 bpm.   Sleep apnea    Syncope    a. 02/2023 Zio: Predominantly normal rhythm, average 77 (55-100).  Occasional PACs (1.1% burden).  9 SVT runs, longest 2 minutes and 51 seconds at a maximum rate of 148.  Intermittent Mobitz 1.  No prolonged pauses.  No triggered events.   T2DM (type 2 diabetes mellitus) (HCC)    Venous reflux    a. 08/2023 LE Venous duplex: No DVT.  Mild bilateral venous insufficiency in the greater saphenous veins in the calves.  Venous reflux noted in the left short saphenous vein.    Social History   Socioeconomic History   Marital status: Married    Spouse name: Orelia Binet   Number of children: 1   Years of education: Not on file   Highest education level: Some college, no degree  Occupational History   Occupation: retired  Tobacco Use   Smoking status: Never   Smokeless tobacco: Never  Vaping Use   Vaping status: Never Used  Substance and Sexual Activity   Alcohol  use: No   Drug use: No   Sexual activity: Not Currently    Birth control/protection: None  Other Topics Concern   Not on file  Social History Narrative   Not on file   Social Drivers of Health   Financial Resource Strain: Low Risk  (07/27/2023)   Overall Financial Resource Strain (CARDIA)    Difficulty of Paying Living Expenses: Not very hard  Food Insecurity: No Food Insecurity (07/27/2023)   Hunger Vital Sign    Worried About Running Out of Food in the Last Year: Never true    Ran Out of Food in the Last Year: Never true  Transportation Needs: No Transportation Needs (07/27/2023)   PRAPARE - Administrator, Civil Service (Medical): No    Lack of Transportation (Non-Medical): No  Physical Activity: Insufficiently Active (07/27/2023)   Exercise Vital Sign    Days of Exercise per  Week: 3 days    Minutes of Exercise per Session: 30 min  Stress: No Stress Concern Present (07/27/2023)   [place]  [employer] - Occupational Stress Questionnaire    Feeling of Stress : Only a little  Social Connections: Moderately Isolated (07/27/2023)   Social Connection and Isolation Panel [NHANES]    Frequency of Communication with Friends and Family: Three times a week    Frequency of Social Gatherings with Friends and Family: Three times a week    Attends Religious Services: Never    Active Member of Clubs or Organizations: No    Attends Banker Meetings: Never    Marital Status: Married  Catering manager Violence: Not At Risk (07/27/2023)   Humiliation, Afraid, Rape, and Kick questionnaire    Fear of Current or Ex-Partner: No    Emotionally Abused: No    Physically Abused: No    Sexually Abused: No    Past Surgical History:  Procedure Laterality Date   ARTHROSCOPIC REPAIR ACL Left    CIRCUMCISION N/A 01/17/2021   Procedure: CIRCUMCISION ADULT;  Surgeon: Dustin Gimenez, MD;  Location: [facility];  Service: Urology;  Laterality: N/A;   CYSTOSCOPY W/ URETERAL STENT PLACEMENT Right 09/07/2014   Procedure: CYSTOSCOPY WITH STENT REPLACEMENT;  Surgeon: Dustin Gimenez, MD;  Location: [facility];  Service: Urology;  Laterality: Right;   CYSTOSCOPY WITH INSERTION OF UROLIFT N/A 11/24/2019   Procedure: CYSTOSCOPY WITH INSERTION OF UROLIFT;  Surgeon: Dustin Gimenez, MD;  Location: [facility];  Service: Urology;  Laterality: N/A;   CYSTOSCOPY/URETEROSCOPY/HOLMIUM LASER/STENT PLACEMENT Right 08/17/2014   Procedure: CYSTOSCOPY/URETEROSCOPY//STENT PLACEMENT/ attempt of lithotripsy;  Surgeon: Dustin Gimenez, MD;  Location: [facility];  Service: Urology;  Laterality: Right;   HERNIA REPAIR     JOINT REPLACEMENT     Bilat knees and right hip   MOHS SURGERY     lip   TOTAL HIP ARTHROPLASTY Right 06/16/2020   Procedure: TOTAL HIP ARTHROPLASTY ANTERIOR APPROACH;   Surgeon: Liliane Rei, MD;  Location: [facility];  Service: Orthopedics;  Laterality: Right;    TOTAL KNEE ARTHROPLASTY Left 04/24/2016   Procedure: LEFT TOTAL KNEE ARTHROPLASTY;  Surgeon: Liliane Rei, MD;  Location: [facility];  Service: Orthopedics;  Laterality: Left;  Adductor Block   TOTAL KNEE ARTHROPLASTY Right 10/23/2016   Procedure: RIGHT TOTAL KNEE ARTHROPLASTY;  Surgeon: Liliane Rei, MD;  Location: [facility];  Service: Orthopedics;  Laterality: Right;   URETEROSCOPY WITH HOLMIUM LASER LITHOTRIPSY Right 09/07/2014   Procedure: URETEROSCOPY WITH HOLMIUM LASER LITHOTRIPSY;  Surgeon: Dustin Gimenez, MD;  Location: [facility];  Service: Urology;  Laterality: Right;   VASECTOMY      Family History  Problem Relation Age of Onset   Hypertension Mother    Cancer Mother    Hyperlipidemia Mother    Arthritis Mother    Stroke Father    Heart disease Father    Obesity Father    Depression Sister    Hypertension Sister    Hyperlipidemia Sister    Learning disabilities Sister    Obesity Sister    Kidney disease Maternal Grandmother     Allergies  Allergen Reactions   Bee Venom Other (See Comments)   Amlodipine      Ankle swelling   Celebrex [Celecoxib]     GI issues   Tetanus Antitoxin Other (See Comments)   Tetanus Toxoids Other (See Comments)    Unkown   Tetanus-Diphtheria Toxoids Td     Unknown       Latest Ref Rng & Units 01/03/2023   12:27 PM 07/28/2022   10:39 AM 02/02/2022   11:10 AM  CBC  WBC 3.4 - 10.8 x10E3/uL 5.8  6.8  6.6   Hemoglobin 13.0 - 17.7 g/dL 21.3  08.6  57.8   Hematocrit 37.5 - 51.0 % 44.3  45.2  44.7   Platelets 150 - 450 x10E3/uL 237  224  195        CMP     Component Value Date/Time   NA 145 (H) 07/20/2023 1143   K 4.5 07/20/2023 1143   CL 104 07/20/2023 1143   CO2 23 07/20/2023 1143   GLUCOSE 161 (H) 07/20/2023 1143   GLUCOSE 141 (H) 01/04/2023 1024   BUN 23 07/20/2023 1143   CREATININE 1.46 (H) 07/20/2023 1143   CREATININE 1.46 (H)  07/28/2022 1039   CALCIUM  9.7 07/20/2023 1143   PROT 7.2 01/04/2023 1024   PROT 6.3 01/03/2023 1227   ALBUMIN 3.7 01/04/2023 1024   ALBUMIN 3.9 01/03/2023 1227   AST 19 01/04/2023 1024   ALT 29 01/04/2023 1024   ALKPHOS 73 01/04/2023 1024   BILITOT 0.8 01/04/2023 1024   BILITOT 0.3 01/03/2023 1227   EGFR 51 (L) 07/20/2023 1143   GFRNONAA 41 (L) 01/04/2023 1024   GFRNONAA 44 (L) 03/16/2020 0000     No results found.     Assessment & Plan:   1. Lymphedema (Primary) Recommend:  I have had a long discussion with the patient regarding swelling and why it  causes symptoms.  Patient endorses he has been wearing compression socks but they are old I suggest that he buy new compression socks at this time.  Patient will begin wearing graduated compression on a daily basis a prescription was given. The patient will  wear the stockings first thing in the morning and removing them in the evening. The patient is instructed specifically not to sleep in the stockings.   We also discussed today the use of his current medications.  Patient had stated that his leg swelling became worse when they started to change some of his blood pressure medications such as metoprolol .  I counseled the patient to follow-up with his primary care physician for an extensive deep dive into his medications to see if there were anything that will contributory to his leg swelling that they could possibly change.  In addition, behavioral modification will be initiated.  This will include frequent elevation, use of over the counter pain medications and exercise such as walking. Do not press hundreds of pounds with your legs at the gym.   Consideration for a lymph pump will also be made based upon the effectiveness of conservative therapy.  This would help to improve the edema control and prevent sequela such as ulcers and infections   Patient should undergo duplex ultrasound of the venous system to ensure that DVT or reflux is  not present.  The patient will follow-up with me after the ultrasound.   2. Essential hypertension Continue antiarrhythmia medications as already ordered, these medications have been reviewed and there are no changes at this time.  Continue anticoagulation as ordered by Cardiology Service  3. Hyperlipidemia associated with type 2 diabetes mellitus (HCC) Continue statin as ordered and reviewed, no changes at this time   Current Outpatient Medications on File Prior to Visit  Medication Sig Dispense Refill   aspirin  EC 81 MG tablet Take 81 mg by mouth daily. Swallow whole.     atorvastatin  (LIPITOR) 40 MG tablet Take 1 tablet (40 mg total) by mouth at bedtime. 90 tablet 1   Cholecalciferol 50 MCG (2000 UT) TABS Take  1 tablet by mouth daily.     clotrimazole  (LOTRIMIN ) 1 % cream Apply 1 application topically 2 (two) times daily. Apply for 7-10 days. 28 g 1   dapagliflozin  propanediol (FARXIGA ) 10 MG TABS tablet Take 1 tablet (10 mg total) by mouth daily before breakfast. Patient receives via [place] Patient Assistance Program 90 tablet 3   fluticasone  (FLONASE ) 50 MCG/ACT nasal spray USE 2 SPRAYS IN EACH NOSTRIL DAILY 16 g 0   gabapentin  (NEURONTIN ) 600 MG tablet Take 1 tablet (600 mg total) by mouth 3 (three) times daily. 270 tablet 1   glipiZIDE  (GLUCOTROL ) 10 MG tablet Take 1 tablet (10 mg total) by mouth daily before breakfast. 90 tablet 3   glucose blood (TRUE METRIX BLOOD GLUCOSE TEST) test strip Test  blood sugar twice a day 100 each 12   [START ON 09/30/2023] HYDROcodone -acetaminophen  (NORCO) 10-325 MG tablet Take 0.5-1 tablets by mouth every 8 (eight) hours as needed for severe pain (pain score 7-10). Gradually reducing dose 28 tablet 0   [START ON 08/31/2023] HYDROcodone -acetaminophen  (NORCO) 10-325 MG tablet Take 0.5-1 tablets by mouth every 8 (eight) hours as needed for severe pain (pain score 7-10). Gradually reducing dose 28 tablet 0   HYDROcodone -acetaminophen  (NORCO) 10-325 MG  tablet Take 0.5-1 tablets by mouth every 8 (eight) hours as needed for severe pain (pain score 7-10). Gradually reducing dose 28 tablet 0   insulin  aspart (NOVOLOG  FLEXPEN) 100 UNIT/ML FlexPen per sliding scale three times daily with meals   Pre-meal insulin  : Check fsbs prior to meals Hold if below 110 If glucose 110- 140 give 4 units If glucose 140 to 180 give 6 units If glucose 180 to 220  give 8 units If glucose above 220 give 10 units 15 mL 11   insulin  degludec (TRESIBA  FLEXTOUCH) 100 UNIT/ML FlexTouch Pen Inject 20 Units into the skin daily. Total daily dose = 0.2-0.5 units/kg/day---> 50% Basal/50% Bolus     ketoconazole  (NIZORAL ) 2 % cream Apply to the feet QHS. 60 g 6   melatonin 3 MG TABS tablet Take 3 mg by mouth at bedtime.     metFORMIN  (GLUCOPHAGE ) 1000 MG tablet TAKE 1 TABLET TWICE A DAY WITH A MEAL 180 tablet 3   metoprolol  succinate (TOPROL -XL) 50 MG 24 hr tablet Take 1 tablet (50 mg total) by mouth daily. Take with or immediately following a meal. 90 tablet 3   Multiple Vitamin (MULTIVITAMIN) tablet Take 1 tablet by mouth daily.     Semaglutide (OZEMPIC, 1 MG/DOSE, [place]) Inject 1 mg into the skin once a week.     sildenafil  (REVATIO ) 20 MG tablet Take 1 tablet (20 mg total) by mouth as needed. Take 1-5 tabs as needed prior to intercourse 30 tablet 11   tiZANidine  (ZANAFLEX ) 4 MG tablet TAKE 1 TABLET EVERY 6 HOURS AS NEEDED FOR MUSCLE SPASMS 270 tablet 4   torsemide (DEMADEX) 20 MG tablet 1 tab daily with 1 additional tab every MWF 130 tablet 3   valsartan  (DIOVAN ) 40 MG tablet Take 1 tablet (40 mg total) by mouth daily. 90 tablet 3   No current facility-administered medications on file prior to visit.    There are no Patient Instructions on file for this visit. No follow-ups on file.   Annamaria Barrette, NP

## 2023-08-31 DIAGNOSIS — I890 Lymphedema, not elsewhere classified: Secondary | ICD-10-CM | POA: Diagnosis not present

## 2023-09-01 LAB — BASIC METABOLIC PANEL WITH GFR
BUN/Creatinine Ratio: 19 (ref 10–24)
BUN: 28 mg/dL — ABNORMAL HIGH (ref 8–27)
CO2: 23 mmol/L (ref 20–29)
Calcium: 9.6 mg/dL (ref 8.6–10.2)
Chloride: 105 mmol/L (ref 96–106)
Creatinine, Ser: 1.46 mg/dL — ABNORMAL HIGH (ref 0.76–1.27)
Glucose: 124 mg/dL — ABNORMAL HIGH (ref 70–99)
Potassium: 4.2 mmol/L (ref 3.5–5.2)
Sodium: 146 mmol/L — ABNORMAL HIGH (ref 134–144)
eGFR: 51 mL/min/{1.73_m2} — ABNORMAL LOW (ref >59)

## 2023-09-02 DIAGNOSIS — E119 Type 2 diabetes mellitus without complications: Secondary | ICD-10-CM | POA: Diagnosis not present

## 2023-09-03 ENCOUNTER — Ambulatory Visit: Admitting: Dermatology

## 2023-09-03 ENCOUNTER — Ambulatory Visit: Payer: Self-pay | Admitting: Nurse Practitioner

## 2023-09-03 ENCOUNTER — Encounter: Payer: Self-pay | Admitting: Dermatology

## 2023-09-03 DIAGNOSIS — D492 Neoplasm of unspecified behavior of bone, soft tissue, and skin: Secondary | ICD-10-CM

## 2023-09-03 DIAGNOSIS — L578 Other skin changes due to chronic exposure to nonionizing radiation: Secondary | ICD-10-CM | POA: Diagnosis not present

## 2023-09-03 DIAGNOSIS — L820 Inflamed seborrheic keratosis: Secondary | ICD-10-CM

## 2023-09-03 DIAGNOSIS — Z1283 Encounter for screening for malignant neoplasm of skin: Secondary | ICD-10-CM | POA: Diagnosis not present

## 2023-09-03 DIAGNOSIS — D229 Melanocytic nevi, unspecified: Secondary | ICD-10-CM

## 2023-09-03 DIAGNOSIS — L918 Other hypertrophic disorders of the skin: Secondary | ICD-10-CM

## 2023-09-03 DIAGNOSIS — Z872 Personal history of diseases of the skin and subcutaneous tissue: Secondary | ICD-10-CM

## 2023-09-03 DIAGNOSIS — L814 Other melanin hyperpigmentation: Secondary | ICD-10-CM

## 2023-09-03 DIAGNOSIS — W908XXA Exposure to other nonionizing radiation, initial encounter: Secondary | ICD-10-CM | POA: Diagnosis not present

## 2023-09-03 DIAGNOSIS — D225 Melanocytic nevi of trunk: Secondary | ICD-10-CM | POA: Diagnosis not present

## 2023-09-03 DIAGNOSIS — D489 Neoplasm of uncertain behavior, unspecified: Secondary | ICD-10-CM

## 2023-09-03 DIAGNOSIS — D1801 Hemangioma of skin and subcutaneous tissue: Secondary | ICD-10-CM

## 2023-09-03 DIAGNOSIS — Z85828 Personal history of other malignant neoplasm of skin: Secondary | ICD-10-CM

## 2023-09-03 DIAGNOSIS — L821 Other seborrheic keratosis: Secondary | ICD-10-CM

## 2023-09-03 NOTE — Progress Notes (Signed)
 Follow-Up Visit   Subjective  Evan Rivas is a 73 y.o. male who presents for the following: Skin Cancer Screening and Full Body Skin Exam Hx isk, hx of bcc  Patient reports a spot he noticed on his back he would like checked.   The patient presents for Total-Body Skin Exam (TBSE) for skin cancer screening and mole check. The patient has spots, moles and lesions to be evaluated, some may be new or changing and the patient may have concern these could be cancer.  The following portions of the chart were reviewed this encounter and updated as appropriate: medications, allergies, medical history  Review of Systems:  No other skin or systemic complaints except as noted in HPI or Assessment and Plan.  Objective  Well appearing patient in no apparent distress; mood and affect are within normal limits.  A full examination was performed including scalp, head, eyes, ears, nose, lips, neck, chest, axillae, abdomen, back, buttocks, bilateral upper extremities, bilateral lower extremities, hands, feet, fingers, toes, fingernails, and toenails. All findings within normal limits unless otherwise noted below.   Relevant physical exam findings are noted in the Assessment and Plan.  right lateral infrapectoral 0.6 cm dark brown macule   right back x 1, left zygoma x 1, left temple x 1, left preauricular x 1, left leg above left medial ankle x 1 (5) Erythematous stuck-on, waxy papule or plaque  Assessment & Plan   SKIN CANCER SCREENING PERFORMED TODAY.  ACTINIC DAMAGE - Chronic condition, secondary to cumulative UV/sun exposure - diffuse scaly erythematous macules with underlying dyspigmentation - Recommend daily broad spectrum sunscreen SPF 30+ to sun-exposed areas, reapply every 2 hours as needed.  - Staying in the shade or wearing long sleeves, sun glasses (UVA+UVB protection) and wide brim hats (4-inch brim around the entire circumference of the hat) are also recommended for sun  protection.  - Call for new or changing lesions.  LENTIGINES, SEBORRHEIC KERATOSES, HEMANGIOMAS - Benign normal skin lesions - Benign-appearing - Call for any changes  MELANOCYTIC NEVI - Tan-brown and/or pink-flesh-colored symmetric macules and papules - Benign appearing on exam today - Observation - Call clinic for new or changing moles - Recommend daily use of broad spectrum spf 30+ sunscreen to sun-exposed areas.   Acrochordons (Skin Tags) B/l axilla - Fleshy, skin-colored pedunculated papules - Benign appearing.  - Observe. - If desired, they can be removed with an in office procedure that is not covered by insurance. - Please call the clinic if you notice any new or changing lesions.  HISTORY OF BASAL CELL CARCINOMA OF THE SKIN 08/25/2021 nasal tip - Mohs completed on 09/30/21  2015 - right upper lip - tx with Mohs by Dr. Debrah Fan - No evidence of recurrence today - Recommend regular full body skin exams - Recommend daily broad spectrum sunscreen SPF 30+ to sun-exposed areas, reapply every 2 hours as needed.  - Call if any new or changing lesions are noted between office visits  NEOPLASM OF UNCERTAIN BEHAVIOR right lateral infrapectoral Epidermal / dermal shaving  Lesion diameter (cm):  0.6 Informed consent: discussed and consent obtained   Timeout: patient name, date of birth, surgical site, and procedure verified   Procedure prep:  Patient was prepped and draped in usual sterile fashion Prep type:  Isopropyl alcohol  Anesthesia: the lesion was anesthetized in a standard fashion   Anesthetic:  1% lidocaine  w/ epinephrine  1-100,000 buffered w/ 8.4% NaHCO3 Instrument used: flexible razor blade   Hemostasis achieved with: pressure,  aluminum chloride and electrodesiccation   Outcome: patient tolerated procedure well   Post-procedure details: sterile dressing applied and wound care instructions given   Dressing type: bandage and petrolatum   Specimen 1 - Surgical  pathology Differential Diagnosis: irregular nevus r/o dysplasia   Check Margins: yes Irregular nevus r/o dysplasia  INFLAMED SEBORRHEIC KERATOSIS (5) right back x 1, left zygoma x 1, left temple x 1, left preauricular x 1, left leg above left medial ankle x 1 (5) Symptomatic, irritating, patient would like treated. Destruction of lesion - right back x 1, left zygoma x 1, left temple x 1, left preauricular x 1, left leg above left medial ankle x 1 (5) Complexity: simple   Destruction method: cryotherapy   Informed consent: discussed and consent obtained   Timeout:  patient name, date of birth, surgical site, and procedure verified Lesion destroyed using liquid nitrogen: Yes   Region frozen until ice ball extended beyond lesion: Yes   Outcome: patient tolerated procedure well with no complications   Post-procedure details: wound care instructions given   Return in about 1 year (around 09/02/2024) for TBSE.  IRandee Busing, CMA, am acting as scribe for Celine Collard, MD.   Documentation: I have reviewed the above documentation for accuracy and completeness, and I agree with the above.  Celine Collard, MD

## 2023-09-03 NOTE — Patient Instructions (Addendum)
 Biopsy Wound Care Instructions  Leave the original bandage on for 24 hours if possible.  If the bandage becomes soaked or soiled before that time, it is OK to remove it and examine the wound.  A small amount of post-operative bleeding is normal.  If excessive bleeding occurs, remove the bandage, place gauze over the site and apply continuous pressure (no peeking) over the area for 30 minutes. If this does not work, please call our clinic as soon as possible or page your doctor if it is after hours.   Once a day, cleanse the wound with soap and water. It is fine to shower. If a thick crust develops you may use a Q-tip dipped into dilute hydrogen peroxide (mix 1:1 with water) to dissolve it.  Hydrogen peroxide can slow the healing process, so use it only as needed.    After washing, apply petroleum jelly (Vaseline) or an antibiotic ointment if your doctor prescribed one for you, followed by a bandage.    For best healing, the wound should be covered with a layer of ointment at all times. If you are not able to keep the area covered with a bandage to hold the ointment in place, this may mean re-applying the ointment several times a day.  Continue this wound care until the wound has healed and is no longer open.   Itching and mild discomfort is normal during the healing process. However, if you develop pain or severe itching, please call our office.   If you have any discomfort, you can take Tylenol (acetaminophen) or ibuprofen as directed on the bottle. (Please do not take these if you have an allergy to them or cannot take them for another reason).  Some redness, tenderness and white or yellow material in the wound is normal healing.  If the area becomes very sore and red, or develops a thick yellow-green material (pus), it may be infected; please notify us.    If you have stitches, return to clinic as directed to have the stitches removed. You will continue wound care for 2-3 days after the stitches  are removed.   Wound healing continues for up to one year following surgery. It is not unusual to experience pain in the scar from time to time during the interval.  If the pain becomes severe or the scar thickens, you should notify the office.    A slight amount of redness in a scar is expected for the first six months.  After six months, the redness will fade and the scar will soften and fade.  The color difference becomes less noticeable with time.  If there are any problems, return for a post-op surgery check at your earliest convenience.  To improve the appearance of the scar, you can use silicone scar gel, cream, or sheets (such as Mederma or Serica) every night for up to one year. These are available over the counter (without a prescription).  Please call our office at [phone] for any questions or concerns.      Seborrheic Keratosis  What causes seborrheic keratoses? Seborrheic keratoses are harmless, common skin growths that first appear during adult life.  As time goes by, more growths appear.  Some people may develop a large number of them.  Seborrheic keratoses appear on both covered and uncovered body parts.  They are not caused by sunlight.  The tendency to develop seborrheic keratoses can be inherited.  They vary in color from skin-colored to gray, brown, or even black.  They can be either smooth or have a rough, warty surface.   Seborrheic keratoses are superficial and look as if they were stuck on the skin.  Under the microscope this type of keratosis looks like layers upon layers of skin.  That is why at times the top layer may seem to fall off, but the rest of the growth remains and re-grows.    Treatment Seborrheic keratoses do not need to be treated, but can easily be removed in the office.  Seborrheic keratoses often cause symptoms when they rub on clothing or jewelry.  Lesions can be in the way of shaving.  If they become inflamed, they can cause itching, soreness, or  burning.  Removal of a seborrheic keratosis can be accomplished by freezing, burning, or surgery. If any spot bleeds, scabs, or grows rapidly, please return to have it checked, as these can be an indication of a skin cancer.  Cryotherapy Aftercare  Wash gently with soap and water everyday.   Apply Vaseline and Band-Aid daily until healed.    Melanoma ABCDEs  Melanoma is the most dangerous type of skin cancer, and is the leading cause of death from skin disease.  You are more likely to develop melanoma if you: Have light-colored skin, light-colored eyes, or red or blond hair Spend a lot of time in the sun Tan regularly, either outdoors or in a tanning bed Have had blistering sunburns, especially during childhood Have a close family member who has had a melanoma Have atypical moles or large birthmarks  Early detection of melanoma is key since treatment is typically straightforward and cure rates are extremely high if we catch it early.   The first sign of melanoma is often a change in a mole or a new dark spot.  The ABCDE system is a way of remembering the signs of melanoma.  A for asymmetry:  The two halves do not match. B for border:  The edges of the growth are irregular. C for color:  A mixture of colors are present instead of an even brown color. D for diameter:  Melanomas are usually (but not always) greater than 6mm - the size of a pencil eraser. E for evolution:  The spot keeps changing in size, shape, and color.  Please check your skin once per month between visits. You can use a small mirror in front and a large mirror behind you to keep an eye on the back side or your body.   If you see any new or changing lesions before your next follow-up, please call to schedule a visit.  Please continue daily skin protection including broad spectrum sunscreen SPF 30+ to sun-exposed areas, reapplying every 2 hours as needed when you're outdoors.   Staying in the shade or wearing long  sleeves, sun glasses (UVA+UVB protection) and wide brim hats (4-inch brim around the entire circumference of the hat) are also recommended for sun protection.    Due to recent changes in healthcare laws, you may see results of your pathology and/or laboratory studies on MyChart before the doctors have had a chance to review them. We understand that in some cases there may be results that are confusing or concerning to you. Please understand that not all results are received at the same time and often the doctors may need to interpret multiple results in order to provide you with the best plan of care or course of treatment. Therefore, we ask that you please give [place] 2 business days to  thoroughly review all your results before contacting the office for clarification. Should we see a critical lab result, you will be contacted sooner.   If You Need Anything After Your Visit  If you have any questions or concerns for your doctor, please call our main line at [phone] and press option 4 to reach your doctor's medical assistant. If no one answers, please leave a voicemail as directed and we will return your call as soon as possible. Messages left after 4 pm will be answered the following business day.   You may also send [place] a message via MyChart. We typically respond to MyChart messages within 1-2 business days.  For prescription refills, please ask your pharmacy to contact our office. Our fax number is [phone].  If you have an urgent issue when the clinic is closed that cannot wait until the next business day, you can page your doctor at the number below.    Please note that while we do our best to be available for urgent issues outside of office hours, we are not available 24/7.   If you have an urgent issue and are unable to reach [place], you may choose to seek medical care at your doctor's office, retail clinic, urgent care center, or emergency room.  If you have a medical emergency, please  immediately call 911 or go to the emergency department.  Pager Numbers  - Dr. Gwen Pounds: [phone]  - Dr. Roseanne Reno: [phone]  - Dr. Katrinka Blazing: [phone]   In the event of inclement weather, please call our main line at [phone] for an update on the status of any delays or closures.  Dermatology Medication Tips: Please keep the boxes that topical medications come in in order to help keep track of the instructions about where and how to use these. Pharmacies typically print the medication instructions only on the boxes and not directly on the medication tubes.   If your medication is too expensive, please contact our office at [phone] option 4 or send [place] a message through MyChart.   We are unable to tell what your co-pay for medications will be in advance as this is different depending on your insurance coverage. However, we may be able to find a substitute medication at lower cost or fill out paperwork to get insurance to cover a needed medication.   If a prior authorization is required to get your medication covered by your insurance company, please allow [place] 1-2 business days to complete this process.  Drug prices often vary depending on where the prescription is filled and some pharmacies may offer cheaper prices.  The website www.goodrx.com contains coupons for medications through different pharmacies. The prices here do not account for what the cost may be with help from insurance (it may be cheaper with your insurance), but the website can give you the price if you did not use any insurance.  - You can print the associated coupon and take it with your prescription to the pharmacy.  - You may also stop by our office during regular business hours and pick up a GoodRx coupon card.  - If you need your prescription sent electronically to a different pharmacy, notify our office through [facility] or by phone at [phone] option 4.     Si Usted Necesita Algo  Despus de Su Visita  Tambin puede enviarnos un mensaje a travs de Clinical cytogeneticist. Por lo general respondemos a los mensajes de MyChart en el transcurso de 1 a 2 das hbiles.  Para renovar recetas, por favor pida a su farmacia que se ponga en contacto con nuestra oficina. Annie Sable de fax es [place] [phone].  Si tiene un asunto urgente cuando la clnica est cerrada y que no puede esperar hasta el siguiente da hbil, puede llamar/localizar a su doctor(a) al nmero que aparece a continuacin.   Por favor, tenga en cuenta que aunque hacemos todo lo posible para estar disponibles para asuntos urgentes fuera del horario de [place], no estamos disponibles las 24 horas del da, los 7 [address]  [address] de la [place].   Si tiene un problema urgente y no puede comunicarse con nosotros, puede optar por buscar atencin mdica  en el consultorio de su doctor(a), en una clnica privada, en un centro de atencin urgente o en una sala de emergencias.  Si tiene Engineer, drilling, por favor llame inmediatamente al 911 o vaya a la sala de emergencias.  Nmeros de bper  - Dr. Gwen Pounds: [phone]  - Dra. Roseanne Reno: [phone]  - Dr. Katrinka Blazing: [phone]   En caso de inclemencias del tiempo, por favor llame a Lacy Duverney principal al [phone] para una actualizacin sobre el [place] de cualquier retraso o cierre.  Consejos para la medicacin en dermatologa: Por favor, guarde las cajas en las que vienen los medicamentos de uso tpico para ayudarle a seguir las instrucciones sobre dnde y cmo usarlos. Las farmacias generalmente imprimen las instrucciones del medicamento slo en las cajas y no directamente en los tubos del [place].   Si su medicamento es muy caro, por favor, pngase en contacto con Rolm Gala llamando al [phone] y presione la opcin 4 o envenos un mensaje a travs de Clinical cytogeneticist.   No podemos decirle cul ser su copago por los medicamentos por adelantado ya que esto es diferente  dependiendo de la cobertura de su seguro. Sin embargo, es posible que podamos encontrar un medicamento sustituto a Audiological scientist un formulario para que el seguro cubra el medicamento que se considera necesario.   Si se requiere una autorizacin previa para que su compaa de seguros [place] su medicamento, por favor permtanos de 1 a 2 das hbiles para completar [address]  Los precios de los medicamentos varan con frecuencia dependiendo del Environmental consultant de dnde se surte la receta y alguna farmacias pueden ofrecer precios ms baratos.  El sitio web www.goodrx.com tiene cupones para medicamentos de Health and safety inspector. Los precios aqu no tienen en cuenta lo que podra costar con la ayuda del seguro (puede ser ms barato con su seguro), pero el sitio web puede darle el precio si no utiliz Tourist information centre manager.  - Puede imprimir el cupn correspondiente y llevarlo con su receta a la farmacia.  - Tambin puede pasar por nuestra oficina durante el horario de atencin regular y Education officer, museum una tarjeta de cupones de GoodRx.  - Si necesita que su receta se enve electrnicamente a una farmacia diferente, informe a nuestra oficina a travs de MyChart de [place] o por telfono llamando al [phone] y presione la opcin 4.

## 2023-09-05 ENCOUNTER — Encounter: Payer: Self-pay | Admitting: Nurse Practitioner

## 2023-09-06 LAB — SURGICAL PATHOLOGY

## 2023-09-07 ENCOUNTER — Ambulatory Visit: Payer: Self-pay | Admitting: Dermatology

## 2023-09-10 NOTE — Telephone Encounter (Addendum)
 Called and discussed results with patient. He verbalized understanding and denied further questions.   ----- Message from Celine Collard sent at 09/07/2023 11:37 AM EDT ----- FINAL DIAGNOSIS        1. Skin, right lateral infrapectoral :       MELANOCYTIC NEVUS, COMPOUND LENTIGINOUS TYPE, IRRITATED   Benign irritated mole No further treatment needed.

## 2023-09-17 ENCOUNTER — Other Ambulatory Visit: Payer: Self-pay | Admitting: Nurse Practitioner

## 2023-09-17 DIAGNOSIS — E1169 Type 2 diabetes mellitus with other specified complication: Secondary | ICD-10-CM

## 2023-09-18 NOTE — Telephone Encounter (Signed)
 Requested Prescriptions  Pending Prescriptions Disp Refills   glipiZIDE  (GLUCOTROL ) 10 MG tablet [Pharmacy Med Name: GLIPIZIDE  TABS 10MG ] 90 tablet 1    Sig: TAKE 1 TABLET DAILY BEFORE BREAKFAST     Endocrinology:  Diabetes - Sulfonylureas Failed - 09/18/2023  5:29 PM      Failed - HBA1C is between 0 and 7.9 and within 180 days    Hemoglobin A1C  Date Value Ref Range Status  08/01/2023 8.1 (A) 4.0 - 5.6 % Final   HbA1c, POC (prediabetic range)  Date Value Ref Range Status  03/25/2018 8.9 (A) 5.7 - 6.4 % Final   HbA1c, POC (controlled diabetic range)  Date Value Ref Range Status  03/25/2018 8.9 (A) 0.0 - 7.0 % Final   HbA1c POC (<> result, manual entry)  Date Value Ref Range Status  03/25/2018 8.9 4.0 - 5.6 % Final   Hgb A1c MFr Bld  Date Value Ref Range Status  07/28/2022 9.0 (H) <5.7 % of total Hgb Final    Comment:    For someone without known diabetes, a hemoglobin A1c value of 6.5% or greater indicates that they may have  diabetes and this should be confirmed with a follow-up  test. . For someone with known diabetes, a value <7% indicates  that their diabetes is well controlled and a value  greater than or equal to 7% indicates suboptimal  control. A1c targets should be individualized based on  duration of diabetes, age, comorbid conditions, and  other considerations. . Currently, no consensus exists regarding use of hemoglobin A1c for diagnosis of diabetes for children. .          Failed - Cr in normal range and within 360 days    Creat  Date Value Ref Range Status  07/28/2022 1.46 (H) 0.70 - 1.28 mg/dL Final   Creatinine, Ser  Date Value Ref Range Status  08/31/2023 1.46 (H) 0.76 - 1.27 mg/dL Final   Creatinine, Urine  Date Value Ref Range Status  02/20/2023 101 20 - 320 mg/dL Final         Passed - Valid encounter within last 6 months    Recent Outpatient Visits           1 month ago Aortic atherosclerosis [facility])   [facility] Quinton Buckler, FNP       Future Appointments             In 1 month Dunn, Elvia Hammans, PA-C [facility] [place]   In 11 months Elta Halter, MD [facility]

## 2023-09-21 ENCOUNTER — Ambulatory Visit: Admitting: Physician Assistant

## 2023-09-24 DIAGNOSIS — I890 Lymphedema, not elsewhere classified: Secondary | ICD-10-CM | POA: Diagnosis not present

## 2023-10-02 DIAGNOSIS — E119 Type 2 diabetes mellitus without complications: Secondary | ICD-10-CM | POA: Diagnosis not present

## 2023-10-03 ENCOUNTER — Other Ambulatory Visit: Payer: Self-pay | Admitting: Nurse Practitioner

## 2023-10-03 DIAGNOSIS — M51369 Other intervertebral disc degeneration, lumbar region without mention of lumbar back pain or lower extremity pain: Secondary | ICD-10-CM

## 2023-10-03 DIAGNOSIS — M545 Low back pain, unspecified: Secondary | ICD-10-CM

## 2023-10-03 DIAGNOSIS — G8929 Other chronic pain: Secondary | ICD-10-CM

## 2023-10-03 DIAGNOSIS — F1120 Opioid dependence, uncomplicated: Secondary | ICD-10-CM

## 2023-10-04 ENCOUNTER — Other Ambulatory Visit: Payer: Self-pay | Admitting: Nurse Practitioner

## 2023-10-04 DIAGNOSIS — E1169 Type 2 diabetes mellitus with other specified complication: Secondary | ICD-10-CM

## 2023-10-04 DIAGNOSIS — F331 Major depressive disorder, recurrent, moderate: Secondary | ICD-10-CM

## 2023-10-04 DIAGNOSIS — F419 Anxiety disorder, unspecified: Secondary | ICD-10-CM

## 2023-10-04 NOTE — Telephone Encounter (Signed)
 Requested Prescriptions  Pending Prescriptions Disp Refills   gabapentin  (NEURONTIN ) 600 MG tablet [Pharmacy Med Name: GABAPENTIN  TABS 600MG ] 270 tablet 0    Sig: TAKE 1 TABLET THREE TIMES A DAY     Neurology: Anticonvulsants - gabapentin  Failed - 10/04/2023  8:41 PM      Failed - Cr in normal range and within 360 days    Creat  Date Value Ref Range Status  07/28/2022 1.46 (H) 0.70 - 1.28 mg/dL Final   Creatinine, Ser  Date Value Ref Range Status  08/31/2023 1.46 (H) 0.76 - 1.27 mg/dL Final   Creatinine, Urine  Date Value Ref Range Status  02/20/2023 101 20 - 320 mg/dL Final         Passed - Completed PHQ-2 or PHQ-9 in the last 360 days      Passed - Valid encounter within last 12 months    Recent Outpatient Visits           2 months ago Aortic atherosclerosis [facility])   [facility] Gareth Mliss FALCON, FNP       Future Appointments             In 2 weeks Dunn, Bernardino HERO, PA-C [facility] [place]   In 11 months Hester Alm BROCKS, MD [facility] [place] [facility]

## 2023-10-08 NOTE — Telephone Encounter (Signed)
 Unable to refill per protocol, Rx expired. Discontinued 07/20/23.  Requested Prescriptions  Pending Prescriptions Disp Refills   metFORMIN  (GLUCOPHAGE ) 1000 MG tablet [Pharmacy Med Name: METFORMIN  HCL TABS 1000MG ] 180 tablet 0    Sig: TAKE 1 TABLET TWICE A DAY WITH MEALS     Endocrinology:  Diabetes - Biguanides Failed - 10/08/2023  1:57 PM      Failed - Cr in normal range and within 360 days    Creat  Date Value Ref Range Status  07/28/2022 1.46 (H) 0.70 - 1.28 mg/dL Final   Creatinine, Ser  Date Value Ref Range Status  08/31/2023 1.46 (H) 0.76 - 1.27 mg/dL Final   Creatinine, Urine  Date Value Ref Range Status  02/20/2023 101 20 - 320 mg/dL Final         Failed - HBA1C is between 0 and 7.9 and within 180 days    Hemoglobin A1C  Date Value Ref Range Status  08/01/2023 8.1 (A) 4.0 - 5.6 % Final   HbA1c, POC (prediabetic range)  Date Value Ref Range Status  03/25/2018 8.9 (A) 5.7 - 6.4 % Final   HbA1c, POC (controlled diabetic range)  Date Value Ref Range Status  03/25/2018 8.9 (A) 0.0 - 7.0 % Final   HbA1c POC (<> result, manual entry)  Date Value Ref Range Status  03/25/2018 8.9 4.0 - 5.6 % Final   Hgb A1c MFr Bld  Date Value Ref Range Status  07/28/2022 9.0 (H) <5.7 % of total Hgb Final    Comment:    For someone without known diabetes, a hemoglobin A1c value of 6.5% or greater indicates that they may have  diabetes and this should be confirmed with a follow-up  test. . For someone with known diabetes, a value <7% indicates  that their diabetes is well controlled and a value  greater than or equal to 7% indicates suboptimal  control. A1c targets should be individualized based on  duration of diabetes, age, comorbid conditions, and  other considerations. . Currently, no consensus exists regarding use of hemoglobin A1c for diagnosis of diabetes for children. .          Failed - eGFR in normal range and within 360 days    GFR, Est African American  Date  Value Ref Range Status  03/16/2020 51 (L) > OR = 60 mL/min/1.59m2 Final   GFR, Est Non African American  Date Value Ref Range Status  03/16/2020 44 (L) > OR = 60 mL/min/1.41m2 Final   GFR, Estimated  Date Value Ref Range Status  01/04/2023 41 (L) >60 mL/min Final    Comment:    (NOTE) Calculated using the CKD-EPI Creatinine Equation (2021)    eGFR  Date Value Ref Range Status  08/31/2023 51 (L) >59 mL/min/1.73 Final         Failed - B12 Level in normal range and within 720 days    No results found for: VITAMINB12       Failed - CBC within normal limits and completed in the last 12 months    WBC  Date Value Ref Range Status  01/03/2023 5.8 3.4 - 10.8 x10E3/uL Final  07/28/2022 6.8 3.8 - 10.8 Thousand/uL Final   RBC  Date Value Ref Range Status  01/03/2023 4.43 4.14 - 5.80 x10E6/uL Final  07/28/2022 4.70 4.20 - 5.80 Million/uL Final   Hemoglobin  Date Value Ref Range Status  01/03/2023 15.0 13.0 - 17.7 g/dL Final   Hematocrit  Date Value Ref Range Status  01/03/2023 44.3 37.5 - 51.0 % Final   MCHC  Date Value Ref Range Status  01/03/2023 33.9 31.5 - 35.7 g/dL Final  95/73/7975 66.5 32.0 - 36.0 g/dL Final   [facility]  Date Value Ref Range Status  01/03/2023 33.9 (H) 26.6 - 33.0 pg Final  07/28/2022 32.1 27.0 - 33.0 pg Final   MCV  Date Value Ref Range Status  01/03/2023 100 (H) 79 - 97 fL Final   No results found for: PLTCOUNTKUC, LABPLAT, POCPLA RDW  Date Value Ref Range Status  01/03/2023 12.4 11.6 - 15.4 % Final         Passed - Valid encounter within last 6 months    Recent Outpatient Visits           2 months ago Aortic atherosclerosis [facility])   [place] [facility] Gareth Mliss FALCON, FNP       Future Appointments             In 2 weeks Dunn, Bernardino HERO, PA-C [facility] [place]   In 11 months Hester Alm BROCKS, MD [facility]            Refused Prescriptions Disp Refills    DULoxetine  (CYMBALTA ) 60 MG capsule [Pharmacy Med Name: DULOXETINE  HCL DR CAPS 60MG ] 90 capsule 3    Sig: TAKE 1 CAPSULE DAILY (MUST ATTEND DECEMBER APPOINTMENT FOR FURTHER REFILLS)     Psychiatry: Antidepressants - SNRI - duloxetine  Failed - 10/08/2023  1:57 PM      Failed - Cr in normal range and within 360 days    Creat  Date Value Ref Range Status  07/28/2022 1.46 (H) 0.70 - 1.28 mg/dL Final   Creatinine, Ser  Date Value Ref Range Status  08/31/2023 1.46 (H) 0.76 - 1.27 mg/dL Final   Creatinine, Urine  Date Value Ref Range Status  02/20/2023 101 20 - 320 mg/dL Final         Failed - Last BP in normal range    BP Readings from Last 1 Encounters:  08/28/23 (!) 190/110         Passed - eGFR is 30 or above and within 360 days    GFR, Est African American  Date Value Ref Range Status  03/16/2020 51 (L) > OR = 60 mL/min/1.7m2 Final   GFR, Est Non African American  Date Value Ref Range Status  03/16/2020 44 (L) > OR = 60 mL/min/1.87m2 Final   GFR, Estimated  Date Value Ref Range Status  01/04/2023 41 (L) >60 mL/min Final    Comment:    (NOTE) Calculated using the CKD-EPI Creatinine Equation (2021)    eGFR  Date Value Ref Range Status  08/31/2023 51 (L) >59 mL/min/1.73 Final         Passed - Completed PHQ-2 or PHQ-9 in the last 360 days      Passed - Valid encounter within last 6 months    Recent Outpatient Visits           2 months ago Aortic atherosclerosis [facility])   [facility] Gareth Mliss FALCON, FNP       Future Appointments             In 2 weeks Dunn, Bernardino HERO, PA-C [facility] [place]   In 11 months Hester Alm BROCKS, MD [facility] [place] [facility]

## 2023-10-23 ENCOUNTER — Encounter: Payer: Self-pay | Admitting: Physician Assistant

## 2023-10-23 ENCOUNTER — Ambulatory Visit: Attending: Physician Assistant | Admitting: Physician Assistant

## 2023-10-23 VITALS — BP 150/84 | HR 79 | Ht 75.0 in | Wt 259.6 lb

## 2023-10-23 DIAGNOSIS — I441 Atrioventricular block, second degree: Secondary | ICD-10-CM

## 2023-10-23 DIAGNOSIS — E785 Hyperlipidemia, unspecified: Secondary | ICD-10-CM

## 2023-10-23 DIAGNOSIS — R55 Syncope and collapse: Secondary | ICD-10-CM | POA: Diagnosis not present

## 2023-10-23 DIAGNOSIS — I872 Venous insufficiency (chronic) (peripheral): Secondary | ICD-10-CM

## 2023-10-23 DIAGNOSIS — Z79899 Other long term (current) drug therapy: Secondary | ICD-10-CM

## 2023-10-23 DIAGNOSIS — G4733 Obstructive sleep apnea (adult) (pediatric): Secondary | ICD-10-CM

## 2023-10-23 DIAGNOSIS — I152 Hypertension secondary to endocrine disorders: Secondary | ICD-10-CM

## 2023-10-23 DIAGNOSIS — N1831 Chronic kidney disease, stage 3a: Secondary | ICD-10-CM

## 2023-10-23 DIAGNOSIS — I700 Atherosclerosis of aorta: Secondary | ICD-10-CM

## 2023-10-23 MED ORDER — VALSARTAN 80 MG PO TABS: 80.0000 mg | ORAL_TABLET | Freq: Every day | ORAL | 3 refills | Status: DC

## 2023-10-23 NOTE — Progress Notes (Signed)
 Cardiology Office Note    Date:  10/23/2023   ID:  Tyrese, Capriotti 05/19/1950, MRN [id number]  PCP:  Gareth Mliss FALCON, FNP  Cardiologist:  Lonni Hanson, MD  Electrophysiologist:  None   Chief Complaint: Follow-up  History of Present Illness:   Evan Rivas is a 73 y.o. male with history of diastolic dysfunction, hyperaldosteronism secondary to adrenal hyperplasia, venous insufficiency, DM2, HTN, HLD, cancer of the lip, gout, anxiety, depression, OSA intolerant to CPAP, and arthritis with chronic pain who presents for follow-up of venous insufficiency.  Prior echo from 2019 showed an EF of 60 to 65%, mild LVH, no regional wall motion abnormalities, grade 2 diastolic dysfunction, mild mitral regurgitation, mildly dilated left atrium, and normal RV systolic function and ventricular cavity size.  Renal artery ultrasound in 03/2018 showed no evidence of RAS.  He was seen in the office in 09/2021 following a recent ED visit for near syncope that occurred having been out of town and not having had much to eat or drink.  ED evaluation was unremarkable.  No interventions were indicated at that time.  More recently, he was seen in our office on 01/03/2019 for noting a year-long history of progressive fatigue, weakness, and dyspnea, particularly with exertion.  These symptoms have significantly impacted his daily activities, ultimately leading to his cessation of golf.  He reported an episode of syncope while in [place] , that occurred while seated, and had an unrevealing extensive workup.  He also reported an episode of syncope while playing golf, leaning over to pick up his ball on the green, and subsequently passing out.  He noted a significant increase in glucose levels around that time trending from 175 to greater than 300.  Blood pressure was soft at 94/68.  At that time, it was recommended he take half of a valsartan /HCTZ.  11 subsequent labs showed AKI and hyperkalemia with recommendation for  ED evaluation.  He was evaluated in the ED with repeat labs showing an improved potassium of 4.7 and improving serum creatinine.  HCTZ was subsequently discontinued with recommendation for patient to take valsartan  40 mg and patient reports metoprolol  50 mg.   He subsequently underwent myocardial PET/CT showed no evidence of ischemia or infarction with an EF of 65% and was overall low risk.  No evidence of coronary artery calcification on CT imaging.  Echo 110/22/2024 showed an EF of 60 to 65%, no regional wall motion abnormality, grade 1 diastolic dysfunction, normal RV systolic function and ventricular cavity size, no significant valvular abnormalities, and an estimated right atrial pressure of 3 mmHg.   He was seen in the office on 02/19/2023 continuing to note exertional fatigue, weakness, and dyspnea without frank chest pain.  No presyncope or syncope.  He also reported a longstanding history of lower extremity swelling with the left lower extremity typically being more noticeable than the right.  Previously noted improvement in swelling with compression socks and leg elevation.  In the setting of elevated blood pressure he was rechallenged with amlodipine  5 mg.  Zio patch demonstrated a predominant rhythm of sinus with an average rate of 77 bpm with occasional PACs with an overall burden of 1.1%, rare PVCs, 9 episodes of SVT lasting up to 2 minutes and 51 seconds with a maximum rate of 148 bpm, and interim second-degree AV block type I with no pauses greater than 3 seconds (results not available at office visit in 03/2023).   He was seen in the office in 03/2023  and was without dizziness, presyncope, or syncope.  He reported nephrology had started torsemide  20 mg 3 days/week and also reported to self initiating spironolactone  25 mg twice daily 4 days/week.  Lower extremity swelling was improved.  In the setting of Zio patch, metoprolol  was reduced to 25 mg daily.   He was seen in the office in 06/2023  and continued to note chronic lower extremity swelling with the left lower extremity being worse than the right.  Swelling was no worse following the reinitiation of amlodipine .  He was taking torsemide  20 mg 3 days/week and was no longer on spironolactone .  CT of the abdomen/pelvis in 06/2023, to evaluate for compressive process contributing to lower extremity swelling showed no acute findings in the abdomen or pelvis with incidental findings noted in the report.  Lower extremity venous duplex negative for DVT bilaterally on 07/19/2023.  Lower extremity ultrasound in 08/2023 notable for bilateral venous reflux.  Despite taking torsemide , wearing compression socks, and keeping his legs elevated as much as possible he has continued to have lower extremity edema.  He does eat frequent processed and fast foods, which he indicated he did not plan on changing at his follow-up in 08/2023.  He was referred to vascular surgery and torsemide  was increased to 20 mg daily with an additional 20 mg on Mondays, Wednesdays, and Fridays.  He comes in today noting an improvement in lower extremity swelling with newer compression socks.  He has been evaluated by vascular surgery condition for conservative management at this time and deferment of lymphedema pumps.  He is now eating out approximately 2 days/week which he reports is fairly consistent with his baseline.  No chest pain, dyspnea, early satiety, or orthopnea.  Weight stable by our scale.  No dizziness, presyncope, or syncope.  He is active and busy at baseline though does try to go to the gym when he can and elevates his legs as much as possible.   Labs independently reviewed: 08/2023 - BUN 20, serum creatinine 1.46, potassium 4.2 07/2023 - A1c 8.1 05/2023 - Hgb 14.2, PLT 219, albumin 4.1, magnesium  1.9 01/2023 - AST/ALT normal, TSH normal 07/2022 - TC 136, TG 232, HDL 37, LDL 70  Past Medical History:  Diagnosis Date   Adrenal hyperplasia (HCC)    Anxiety    panic  attacks   Arthritis    Basal cell carcinoma 2015   R upper lip - tx with MOHS by Dr. Bluford   Basal cell carcinoma 08/25/2021   BCC, nasal tip, Mohs completed on 09/30/21   CKD (chronic kidney disease), stage III (HCC)    Depression    Diastolic dysfunction    a.02/2018 Echo: LVEF 60-65%, mild LVH, G2DD, mild MR, mildly dilated LA, RV cavity size/wall thickness/systolic function normal; b. 01/2023 Echo: EF 60-65%, no rwma, GrI DD, nl RV fxn, no significant valvular disease.   GERD (gastroesophageal reflux disease)    Gout    History of kidney stones    History of stress test    a. 01/2023 Lexiscan  PET Stress: EF 68% without evidence of ischemia or infarct.  No significant coronary calcium .   Hyperaldosteronism (HCC)    a.) secondary to BILATERAL adrenal hyperplasia   Hyperlipidemia    Hypertension    Nerve entrapment syndrome of lower extremity, right 10/24/2019   Pneumonia    2014 and 2018 ( 2018-klebsiella pneumonia    PSVT (paroxysmal supraventricular tachycardia) (HCC)    a. 02/2023 Zio: 9 SVT runs lasting up to  2 minutes and 51 seconds at a maximal rate of 148 bpm.   Sleep apnea    Syncope    a. 02/2023 Zio: Predominantly normal rhythm, average 77 (55-100).  Occasional PACs (1.1% burden).  9 SVT runs, longest 2 minutes and 51 seconds at a maximum rate of 148.  Intermittent Mobitz 1.  No prolonged pauses.  No triggered events.   T2DM (type 2 diabetes mellitus) (HCC)    Venous reflux    a. 08/2023 LE Venous duplex: No DVT.  Mild bilateral venous insufficiency in the greater saphenous veins in the calves.  Venous reflux noted in the left short saphenous vein.    Past Surgical History:  Procedure Laterality Date   ARTHROSCOPIC REPAIR ACL Left    CIRCUMCISION N/A 01/17/2021   Procedure: CIRCUMCISION ADULT;  Surgeon: Penne Knee, MD;  Location: [facility];  Service: Urology;  Laterality: N/A;   CYSTOSCOPY W/ URETERAL STENT PLACEMENT Right 09/07/2014   Procedure: CYSTOSCOPY WITH  STENT REPLACEMENT;  Surgeon: Knee Penne, MD;  Location: [facility];  Service: Urology;  Laterality: Right;   CYSTOSCOPY WITH INSERTION OF UROLIFT N/A 11/24/2019   Procedure: CYSTOSCOPY WITH INSERTION OF UROLIFT;  Surgeon: Penne Knee, MD;  Location: [facility];  Service: Urology;  Laterality: N/A;   CYSTOSCOPY/URETEROSCOPY/HOLMIUM LASER/STENT PLACEMENT Right 08/17/2014   Procedure: CYSTOSCOPY/URETEROSCOPY//STENT PLACEMENT/ attempt of lithotripsy;  Surgeon: Knee Penne, MD;  Location: [facility];  Service: Urology;  Laterality: Right;   HERNIA REPAIR     JOINT REPLACEMENT     Bilat knees and right hip   MOHS SURGERY     lip   TOTAL HIP ARTHROPLASTY Right 06/16/2020   Procedure: TOTAL HIP ARTHROPLASTY ANTERIOR APPROACH;  Surgeon: Melodi Lerner, MD;  Location: [facility];  Service: Orthopedics;  Laterality: Right;    TOTAL KNEE ARTHROPLASTY Left 04/24/2016   Procedure: LEFT TOTAL KNEE ARTHROPLASTY;  Surgeon: Lerner Melodi, MD;  Location: [facility];  Service: Orthopedics;  Laterality: Left;  Adductor Block   TOTAL KNEE ARTHROPLASTY Right 10/23/2016   Procedure: RIGHT TOTAL KNEE ARTHROPLASTY;  Surgeon: Melodi Lerner, MD;  Location: [facility];  Service: Orthopedics;  Laterality: Right;   URETEROSCOPY WITH HOLMIUM LASER LITHOTRIPSY Right 09/07/2014   Procedure: URETEROSCOPY WITH HOLMIUM LASER LITHOTRIPSY;  Surgeon: Knee Penne, MD;  Location: [facility];  Service: Urology;  Laterality: Right;   VASECTOMY      Current Medications: Current Meds  Medication Sig   aspirin  EC 81 MG tablet Take 81 mg by mouth daily. Swallow whole.   atorvastatin  (LIPITOR) 40 MG tablet Take 1 tablet (40 mg total) by mouth at bedtime.   Cholecalciferol 50 MCG (2000 UT) TABS Take 1 tablet by mouth daily.   clotrimazole  (LOTRIMIN ) 1 % cream Apply 1 application topically 2 (two) times daily. Apply for 7-10 days.   dapagliflozin  propanediol (FARXIGA ) 10 MG TABS tablet Take 1 tablet (10 mg total) by mouth daily before  breakfast. Patient receives via [place] Patient Assistance Program   fluticasone  (FLONASE ) 50 MCG/ACT nasal spray USE 2 SPRAYS IN EACH NOSTRIL DAILY   gabapentin  (NEURONTIN ) 600 MG tablet TAKE 1 TABLET THREE TIMES A DAY   glipiZIDE  (GLUCOTROL ) 10 MG tablet TAKE 1 TABLET DAILY BEFORE BREAKFAST   glucose blood (TRUE METRIX BLOOD GLUCOSE TEST) test strip Test  blood sugar twice a day   HYDROcodone -acetaminophen  (NORCO) 10-325 MG tablet Take 0.5-1 tablets by mouth every 8 (eight) hours as needed for severe pain (pain score 7-10). Gradually reducing dose   HYDROcodone -acetaminophen  (NORCO) 10-325  MG tablet Take 0.5-1 tablets by mouth every 8 (eight) hours as needed for severe pain (pain score 7-10). Gradually reducing dose   HYDROcodone -acetaminophen  (NORCO) 10-325 MG tablet Take 0.5-1 tablets by mouth every 8 (eight) hours as needed for severe pain (pain score 7-10). Gradually reducing dose   insulin  aspart (NOVOLOG  FLEXPEN) 100 UNIT/ML FlexPen per sliding scale three times daily with meals   Pre-meal insulin  : Check fsbs prior to meals Hold if below 110 If glucose 110- 140 give 4 units If glucose 140 to 180 give 6 units If glucose 180 to 220  give 8 units If glucose above 220 give 10 units   insulin  degludec (TRESIBA  FLEXTOUCH) 100 UNIT/ML FlexTouch Pen Inject 20 Units into the skin daily. Total daily dose = 0.2-0.5 units/kg/day---> 50% Basal/50% Bolus   ketoconazole  (NIZORAL ) 2 % cream Apply to the feet QHS.   melatonin 3 MG TABS tablet Take 3 mg by mouth at bedtime.   metFORMIN  (GLUCOPHAGE ) 1000 MG tablet TAKE 1 TABLET TWICE A DAY WITH MEALS   metoprolol  succinate (TOPROL -XL) 50 MG 24 hr tablet Take 1 tablet (50 mg total) by mouth daily. Take with or immediately following a meal.   Multiple Vitamin (MULTIVITAMIN) tablet Take 1 tablet by mouth daily.   Semaglutide (OZEMPIC, 1 MG/DOSE, [place]) Inject 1 mg into the skin once a week.   sildenafil  (REVATIO ) 20 MG tablet Take 1 tablet (20 mg total) by mouth as  needed. Take 1-5 tabs as needed prior to intercourse   tiZANidine  (ZANAFLEX ) 4 MG tablet TAKE 1 TABLET EVERY 6 HOURS AS NEEDED FOR MUSCLE SPASMS   torsemide  (DEMADEX ) 20 MG tablet 1 tab daily with 1 additional tab every MWF   [DISCONTINUED] valsartan  (DIOVAN ) 40 MG tablet Take 1 tablet (40 mg total) by mouth daily.    Allergies:   Bee venom, Amlodipine , Celebrex [celecoxib], Tetanus antitoxin, Tetanus toxoids, and Tetanus-diphtheria toxoids td   Social History   Socioeconomic History   Marital status: Married    Spouse name: Elveria   Number of children: 1   Years of education: Not on file   Highest education level: Some college, no degree  Occupational History   Occupation: retired  Tobacco Use   Smoking status: Never   Smokeless tobacco: Never  Vaping Use   Vaping status: Never Used  Substance and Sexual Activity   Alcohol  use: No   Drug use: No   Sexual activity: Not Currently    Birth control/protection: None  Other Topics Concern   Not on file  Social History Narrative   Not on file   Social Drivers of Health   Financial Resource Strain: Low Risk  (07/27/2023)   Overall Financial Resource Strain (CARDIA)    Difficulty of Paying Living Expenses: Not very hard  Food Insecurity: No Food Insecurity (07/27/2023)   Hunger Vital Sign    Worried About Running Out of Food in the Last Year: Never true    Ran Out of Food in the Last Year: Never true  Transportation Needs: No Transportation Needs (07/27/2023)   PRAPARE - Administrator, Civil Service (Medical): No    Lack of Transportation (Non-Medical): No  Physical Activity: Insufficiently Active (07/27/2023)   Exercise Vital Sign    Days of Exercise per Week: 3 days    Minutes of Exercise per Session: 30 min  Stress: No Stress Concern Present (07/27/2023)   [employer] of Occupational Health - Occupational Stress Questionnaire    Feeling of Stress :  Only a little  Social Connections: Moderately Isolated  (07/27/2023)   Social Connection and Isolation Panel    Frequency of Communication with Friends and Family: Three times a week    Frequency of Social Gatherings with Friends and Family: Three times a week    Attends Religious Services: Never    Active Member of Clubs or Organizations: No    Attends Banker Meetings: Never    Marital Status: Married     Family History:  The patient's family history includes Arthritis in his mother; Cancer in his mother; Depression in his sister; Heart disease in his father; Hyperlipidemia in his mother and sister; Hypertension in his mother and sister; Kidney disease in his maternal grandmother; Learning disabilities in his sister; Obesity in his father and sister; Stroke in his father.  ROS:   12-point review of systems is negative unless otherwise noted in the HPI.   EKGs/Labs/Other Studies Reviewed:    Studies reviewed were summarized above. The additional studies were reviewed today:  2D echo 02/2018: - Left ventricle: The cavity size was normal. Wall thickness was   increased in a pattern of mild LVH. Systolic function was normal.   The estimated ejection fraction was in the range of 60% to 65%.   Wall motion was normal; there were no regional wall motion   abnormalities. Features are consistent with a pseudonormal left   ventricular filling pattern, with concomitant abnormal relaxation   and increased filling pressure (grade 2 diastolic dysfunction). - Mitral valve: There was mild regurgitation. - Left atrium: The atrium was mildly dilated. - Right ventricle: The cavity size was normal. Wall thickness was   normal. Systolic function was normal. __________   Renal artery ultrasound 03/2018: Summary: Largest Aortic Diameter: 2.0 cm   Renal:   Right: Normal size right kidney. Normal right Resisitive Index.        Abnormal cortical thickness of right kidney. No evidence of        right renal artery stenosis. RRV flow  present. Left:  LRV flow present. No evidence of left renal artery stenosis.        Normal size of left kidney. Normal left Resistive Index.        Normal cortical thickness of the left kidney. Mesenteric: Normal Celiac artery and Superior Mesenteric artery findings. __________   Myocardial/CT 01/18/2023:   LV perfusion is normal. There is no evidence of ischemia. There is no evidence of infarction.   Stress left ventricular function is normal. Stress EF: 68%. Resting EF not accurate due to poor gating and not reported End diastolic cavity size is normal. End systolic cavity size is normal.   Myocardial blood flow was computed to be 0.67ml/g/min at rest and 2.16ml/g/min at stress. Global myocardial blood flow reserve was 2.45 and was normal.   Coronary calcium  was absent on the attenuation correction CT images.   The study is normal. The study is low risk. ___________   2D echo 01/23/2023: 1. Left ventricular ejection fraction, by estimation, is 60 to 65%. The  left ventricle has normal function. The left ventricle has no regional  wall motion abnormalities. Left ventricular diastolic parameters are  consistent with Grade I diastolic  dysfunction (impaired relaxation).   2. Right ventricular systolic function is normal. The right ventricular  size is normal. Tricuspid regurgitation signal is inadequate for assessing  PA pressure.   3. The mitral valve is normal in structure. No evidence of mitral valve  regurgitation. No evidence  of mitral stenosis.   4. The aortic valve is tricuspid. Aortic valve regurgitation is not  visualized. No aortic stenosis is present.   5. The inferior vena cava is normal in size with greater than 50%  respiratory variability, suggesting right atrial pressure of 3 mmHg.  __________   Zio patch 02/2023:   The patient was monitored for 14 days.   The predominant rhythm was sinus with an average rate of 77 bpm (rante 55-100 bpm in sinus).   There were  occasional PAC's (1.1% burden) and rare PVC's.   9 supraventricular runs were observed, lasting up to 2 minutes, 51 seconds with a maximum rate of 148 bpm.   Intermittent Mobitz type 1 second degree AV block was seen.  There was no pause greater than 3 seconds.   No patient triggered events occurred.   Predominantly sinus rhythm with PSVT and intermittent Mobitz type 1 second degree AV block as well as occasional PAC's (see details above).   EKG:  EKG is ordered today.  The EKG ordered today demonstrates Sinus rhythm with 2nd degree AV block type I, 79 bpm, no acute st/t changes, consistent with prior tracing  Recent Labs: 01/03/2023: Hemoglobin 15.0; Platelets 237; TSH 1.880 01/04/2023: ALT 29 08/31/2023: BUN 28; Creatinine, Ser 1.46; Potassium 4.2; Sodium 146  Recent Lipid Panel    Component Value Date/Time   CHOL 136 07/28/2022 1039   CHOL 140 09/17/2017 1014   TRIG 232 (H) 07/28/2022 1039   HDL 37 (L) 07/28/2022 1039   HDL 32 (L) 09/17/2017 1014   CHOLHDL 3.7 07/28/2022 1039   LDLCALC 70 07/28/2022 1039    PHYSICAL EXAM:    VS:  BP (!) 150/84 (BP Location: Left Arm, Patient Position: Sitting, Cuff Size: Normal)   Pulse 79   Ht 6' 3 (1.905 m)   Wt 259 lb 9.6 oz (117.8 kg)   SpO2 99%   BMI 32.45 kg/m   BMI: Body mass index is 32.45 kg/m.  Physical Exam Vitals reviewed.  Constitutional:      Appearance: He is well-developed.  HENT:     Head: Normocephalic and atraumatic.  Eyes:     General:        Right eye: No discharge.        Left eye: No discharge.  Cardiovascular:     Rate and Rhythm: Normal rate and regular rhythm.     Heart sounds: Normal heart sounds, S1 normal and S2 normal. Heart sounds not distant. No midsystolic click and no opening snap. No murmur heard.    No friction rub.  Pulmonary:     Effort: Pulmonary effort is normal. No respiratory distress.     Breath sounds: Normal breath sounds. No decreased breath sounds, wheezing, rhonchi or rales.   Chest:     Chest wall: No tenderness.  Musculoskeletal:     Cervical back: Normal range of motion.     Right lower leg: Edema present.     Left lower leg: Edema present.     Comments: 1+ bilateral lower extremity edema with compression socks in place.  Skin:    General: Skin is warm and dry.     Nails: There is no clubbing.  Neurological:     Mental Status: He is alert and oriented to person, place, and time.  Psychiatric:        Speech: Speech normal.        Behavior: Behavior normal.        Thought Content: Thought  content normal.        Judgment: Judgment normal.     Wt Readings from Last 3 Encounters:  10/23/23 259 lb 9.6 oz (117.8 kg)  08/28/23 257 lb (116.6 kg)  08/22/23 259 lb 12.8 oz (117.8 kg)     ASSESSMENT & PLAN:   History of syncope: No further episodes of syncope. Etiology felt to be less likely arrhythmogenic per EP. Reassuring echo and myocardial PET/CT. Zio patch with sinus rhythm with PSVT and intermittent Mobitz type I second-degree AV block. No indication for PPM at this time.   Venous insufficiency: Symptoms overall improving with new compression socks.  Recent bilateral lower extremity ultrasound negative for DVT with imaging confirming bilateral venous reflux.  No compressive process noted on CT of the abdomen/pelvis.  Continue to avoid calcium  channel blocker.  Remains on torsemide  20 mg daily.  Continue with leg elevation.  Continue to follow-up with vascular surgery.  HTN due to endocrine disorder: Blood pressure is elevated in the office today, though improved from prior reading.  Unable to take amlodipine  due to worsening lower extremity swelling.  To take spironolactone  due to prior hyperkalemia.  Titrate valsartan  to 80 mg daily with a follow-up BMP in 7 to 10 days thereafter.  For now, is on Toprol -XL 50 mg daily.  Could consider transitioning metoprolol  to carvedilol.  Low-sodium diet recommended.  Intermittent secondary AV block type I: No symptoms  of presyncope or syncope.  Ultimately may need to transition off beta-blocker.  Aortic atherosclerosis/HLD: LDL 70 in 07/2022.  Remains on atorvastatin  40 mg.  CKD stage IIIa with history of hyperkalemia: Check BMP in 7 to 10 days as above.  OSA: Intolerant to CPAP.     Disposition: F/u with Dr. Mady or an APP in 1 month.   Medication Adjustments/Labs and Tests Ordered: Current medicines are reviewed at length with the patient today.  Concerns regarding medicines are outlined above. Medication changes, Labs and Tests ordered today are summarized above and listed in the Patient Instructions accessible in Encounters.   Signed, Bernardino Bring, PA-C 10/23/2023 12:29 PM     [facility] [place] [address] [place], [place] [address] [phone]

## 2023-10-23 NOTE — Patient Instructions (Signed)
 Medication Instructions:  Your physician recommends the following medication changes.  INCREASE: Valsartan  80 mg daily   *If you need a refill on your cardiac medications before your next appointment, please call your pharmacy*  Lab Work: Your provider would like for you to return in 7-10 days to have the following labs drawn: BMeT.   Please go to [facility] [address] ([facility] [address], [place] [address] You do not need an appointment.  They are open from 8 am- 4:30 pm.  Lunch from 1:00 pm- 2:00 pm You DO NOT need to be fasting.  If you have labs (blood work) drawn today and your tests are completely normal, you will receive your results only by: MyChart Message (if you have MyChart) OR A paper copy in the mail If you have any lab test that is abnormal or we need to change your treatment, we will call you to review the results.  Testing/Procedures: None ordered at this time   Follow-Up: At [facility], you and your health needs are our priority.  As part of our continuing mission to provide you with exceptional heart care, our providers are all part of one team.  This team includes your primary Cardiologist (physician) and Advanced Practice Providers or APPs (Physician Assistants and Nurse Practitioners) who all work together to provide you with the care you need, when you need it.  Your next appointment:   1 month(s)  Provider:   You may see Lonni Hanson, MD or Bernardino Bring, PA-C

## 2023-10-25 ENCOUNTER — Encounter: Payer: Self-pay | Admitting: Nurse Practitioner

## 2023-10-29 ENCOUNTER — Encounter: Payer: Self-pay | Admitting: Pharmacist

## 2023-10-29 ENCOUNTER — Other Ambulatory Visit: Payer: Self-pay | Admitting: Pharmacist

## 2023-10-29 DIAGNOSIS — E1165 Type 2 diabetes mellitus with hyperglycemia: Secondary | ICD-10-CM

## 2023-10-29 NOTE — Patient Instructions (Signed)
 As of 7/25, Ozempic  and NovoFine pen needles will no longer be automatically refilled from [employer] patient assistance program. To request refills after this date, program must receive a refill request form from office up to 30 days prior to refill being due.   Please monitor your supply of Ozempic  at home and when you have only a 1 month supply remaining, contact Shasta Spear at [phone] to request she start refill process for you.   Thank you!   Sharyle Sia, PharmD, [facility] Clinical Pharmacist [facility] [phone]

## 2023-10-29 NOTE — Progress Notes (Signed)
   10/29/2023  Patient ID: Evan Rivas, male   DOB: [birth year], 73 y.o.   MRN: [id number]  Patient enrolled in patient assistance program from [employer].  As of 7/25, Ozempic and NovoFine pen needles will no longer be eligible for automatic refill from [employer]. To request refills after this date, program must receive a refill request form from office up to 30 days prior to refill being due.  Outreach to patient today and provide this update. Advise patient to monitor supply of Ozempic at home and when has only a 1 month supply remaining, to contact Evan Rivas: Phone: [phone] to request CPhT start refill form with PCP to be sent to program.  Sharyle Sia, PharmD, [facility] Clinical Pharmacist [facility] [phone]

## 2023-10-31 ENCOUNTER — Ambulatory Visit: Admitting: Nurse Practitioner

## 2023-10-31 VITALS — BP 132/84 | HR 83 | Resp 16 | Ht 75.0 in | Wt 257.0 lb

## 2023-10-31 DIAGNOSIS — N1831 Chronic kidney disease, stage 3a: Secondary | ICD-10-CM

## 2023-10-31 DIAGNOSIS — I700 Atherosclerosis of aorta: Secondary | ICD-10-CM

## 2023-10-31 DIAGNOSIS — J301 Allergic rhinitis due to pollen: Secondary | ICD-10-CM

## 2023-10-31 DIAGNOSIS — N400 Enlarged prostate without lower urinary tract symptoms: Secondary | ICD-10-CM

## 2023-10-31 DIAGNOSIS — M1611 Unilateral primary osteoarthritis, right hip: Secondary | ICD-10-CM

## 2023-10-31 DIAGNOSIS — E1169 Type 2 diabetes mellitus with other specified complication: Secondary | ICD-10-CM

## 2023-10-31 DIAGNOSIS — G4733 Obstructive sleep apnea (adult) (pediatric): Secondary | ICD-10-CM

## 2023-10-31 DIAGNOSIS — M5136 Other intervertebral disc degeneration, lumbar region with discogenic back pain only: Secondary | ICD-10-CM

## 2023-10-31 DIAGNOSIS — R5383 Other fatigue: Secondary | ICD-10-CM

## 2023-10-31 DIAGNOSIS — I10 Essential (primary) hypertension: Secondary | ICD-10-CM

## 2023-10-31 DIAGNOSIS — G8929 Other chronic pain: Secondary | ICD-10-CM

## 2023-10-31 DIAGNOSIS — E119 Type 2 diabetes mellitus without complications: Secondary | ICD-10-CM

## 2023-10-31 DIAGNOSIS — E785 Hyperlipidemia, unspecified: Secondary | ICD-10-CM | POA: Diagnosis not present

## 2023-10-31 DIAGNOSIS — F329 Major depressive disorder, single episode, unspecified: Secondary | ICD-10-CM

## 2023-10-31 DIAGNOSIS — F1120 Opioid dependence, uncomplicated: Secondary | ICD-10-CM

## 2023-10-31 DIAGNOSIS — E269 Hyperaldosteronism, unspecified: Secondary | ICD-10-CM

## 2023-10-31 MED ORDER — HYDROCODONE-ACETAMINOPHEN 10-325 MG PO TABS
0.5000 | ORAL_TABLET | Freq: Three times a day (TID) | ORAL | 0 refills | Status: DC | PRN
Start: 2023-11-30 — End: 2024-01-31

## 2023-10-31 MED ORDER — HYDROCODONE-ACETAMINOPHEN 10-325 MG PO TABS: 0.5000 | ORAL_TABLET | Freq: Three times a day (TID) | ORAL | 0 refills | Status: DC | PRN

## 2023-10-31 NOTE — Progress Notes (Signed)
 BP 132/84   Pulse 83   Resp 16   Ht 6' 3 (1.905 m)   Wt 257 lb (116.6 kg)   SpO2 99%   BMI 32.12 kg/m    Subjective:    Patient ID: Evan Rivas, male    DOB: Apr 30, 1950, 73 y.o.   MRN: [id number]  HPI: Evan Rivas is a 73 y.o. male  Chief Complaint  Patient presents with   Medical Management of Chronic Issues    Discussed the use of AI scribe software for clinical note transcription with the patient, who gave verbal consent to proceed.  History of Present Illness Evan Rivas is a 73 year old male who presents with concerns about medication side effects and leg swelling.  Bilateral lower extremity edema - Significant bilateral leg swelling - Edema worsens with prolonged standing - Improves with leg elevation - Pain occurs when lowering legs - No history of blood clots - Etiology of swelling is unclear -has been working with vascular and cardiology with no improvement  Chronic pain and gabapentin  use - Chronic pain managed with gabapentin  600 mg three times daily - Concern about potential gabapentin  side effects, including 'dementia-like symptoms' and swelling - Desires to reduce medication burden and is considering tapering off gabapentin  to assess for symptom improvement -also taking hydrocodone  as needed for pain,  current 28 pills per month.   Glycemic control in diabetes mellitus - Blood glucose generally well-controlled - Occasional elevated morning blood glucose readings up to 161 mg/dL, particularly when skipping dinner - Diabetes managed with Farxiga  10 mg daily, glipizide  10 mg daily, metformin  1000 mg twice daily, Novolog  on a sliding scale, Tresiba  20 units daily, and Ozempic 1 mg weekly  Polypharmacy - Current medication regimen includes: aspirin  81 mg daily, atorvastatin  40 mg daily, Flonase  daily, hydrocodone -acetaminophen  10-325 mg as needed for chronic pain, metoprolol  50 mg daily, sildenafil  20 mg as needed, tizanidine  4 mg every  six hours for muscle spasms, torsemide  20 mg daily, and valsartan  80 mg daily         10/31/2023   10:50 AM 07/27/2023   10:27 AM 02/20/2023   11:21 AM  Depression screen PHQ 2/9  Decreased Interest 0 0 1  Down, Depressed, Hopeless 0 0 1  PHQ - 2 Score 0 0 2  Altered sleeping 0 1   Tired, decreased energy 0 0   Change in appetite 0 0   Feeling bad or failure about yourself  0 0   Trouble concentrating 0 0   Moving slowly or fidgety/restless 0 0   Suicidal thoughts 0 0   PHQ-9 Score 0 1   Difficult doing work/chores  Not difficult at all     Relevant past medical, surgical, family and social history reviewed and updated as indicated. Interim medical history since our last visit reviewed. Allergies and medications reviewed and updated.  Review of Systems  Ten systems reviewed and is negative except as mentioned in HPI      Objective:     BP 132/84   Pulse 83   Resp 16   Ht 6' 3 (1.905 m)   Wt 257 lb (116.6 kg)   SpO2 99%   BMI 32.12 kg/m    Wt Readings from Last 3 Encounters:  10/31/23 257 lb (116.6 kg)  10/23/23 259 lb 9.6 oz (117.8 kg)  08/28/23 257 lb (116.6 kg)    Physical Exam Physical Exam GENERAL: Alert, cooperative, well developed, no acute distress HEENT: Normocephalic,  normal oropharynx, moist mucous membranes CHEST: Clear to auscultation bilaterally, No wheezes, rhonchi, or crackles CARDIOVASCULAR: Normal heart rate and rhythm, S1 and S2 normal without murmurs ABDOMEN: Soft, non-tender, non-distended, without organomegaly, Normal bowel sounds EXTREMITIES: No cyanosis, lower extremity edema NEUROLOGICAL: Cranial nerves grossly intact, Moves all extremities without gross motor or sensory deficit   Results for orders placed or performed in visit on 09/03/23  Surgical pathology   Collection Time: 09/03/23 12:00 AM  Result Value Ref Range   SURGICAL PATHOLOGY      SURGICAL PATHOLOGY [facility] [address] [place], [place] [address] Telephone [phone] or [phone] Fax [phone]  REPORT OF DERMATOPATHOLOGY   Accession #: [id number] Patient Name: Evan Rivas, Evan Rivas Visit # : [id number]  MRN: [id number] Physician: Hester Lenis DOB/Age January 18, 1951 (Age: 8) Gender: M Collected Date: 09/03/2023 Received Date: 09/04/2023  FINAL DIAGNOSIS       1. Skin, right lateral infrapectoral :       MELANOCYTIC NEVUS, COMPOUND LENTIGINOUS TYPE, IRRITATED       DATE SIGNED OUT: 09/06/2023 ELECTRONIC SIGNATURE : DIONA M.D., HONGYU, Dermatopathologist  MICROSCOPIC DESCRIPTION 1. There is a proliferation of banal melanocytes with a somewhat lentiginous pattern at the dermal epidermal junction with nests in the dermis, which are generally smaller as they are deeper.  On limited margin evaluation, the edges appear free.  CASE COMMENTS STAINS USED IN DIAGNOSIS: H&E    CLINICAL HISTORY   SPECIMEN(S) OBTAINED 1. Skin, Right Lateral Infrapectoral  SPECIMEN COMMENTS: 1. 0.6 cm dark brown macule, check margins SPECIMEN CLINICAL INFORMATION: 1. Neoplasm of uncertain behavior, irregular nevus R/O dysplasia    Gross Description 1. Formalin fixed specimen received:  7 X 5 X 1 MM, TOTO (3 P) (1 B) ( QB ) MARGINS INKED.        Report signed out from the following location(s) [place] [address] [place], [place] [address] CLIA #: [id number]  [facility] [address] [place], [place] [address] CLIA #: [id number]           Assessment & Plan:   Problem List Items Addressed This Visit       Cardiovascular and Mediastinum   Essential hypertension - Primary   Relevant Orders   CBC with Differential/Platelet   Comprehensive metabolic panel with GFR   Aortic atherosclerosis (HCC)   Relevant Orders   Lipid panel     Respiratory   Allergic rhinitis   OSA (obstructive sleep apnea)     Endocrine   Hyperlipidemia  associated with type 2 diabetes mellitus (HCC)   Relevant Orders   Lipid panel   Type 2 diabetes mellitus without complication, without long-term current use of insulin  (HCC)   Relevant Orders   Hemoglobin A1c   HM Diabetes Foot Exam (Completed)   Hyperaldosteronism (HCC)     Musculoskeletal and Integument   DDD (degenerative disc disease), lumbar   Relevant Medications   HYDROcodone -acetaminophen  (NORCO) 10-325 MG tablet   HYDROcodone -acetaminophen  (NORCO) 10-325 MG tablet (Start on 11/30/2023)   HYDROcodone -acetaminophen  (NORCO) 10-325 MG tablet (Start on 12/30/2023)   Osteoarthritis of right hip   Relevant Medications   HYDROcodone -acetaminophen  (NORCO) 10-325 MG tablet   HYDROcodone -acetaminophen  (NORCO) 10-325 MG tablet (Start on 11/30/2023)   HYDROcodone -acetaminophen  (NORCO) 10-325 MG tablet (Start on 12/30/2023)     Genitourinary   BPH without urinary obstruction   Stage 3a chronic kidney disease (HCC)     Other  Anxiety   Continuous opioid dependence (HCC)   Relevant Medications   HYDROcodone -acetaminophen  (NORCO) 10-325 MG tablet   HYDROcodone -acetaminophen  (NORCO) 10-325 MG tablet (Start on 11/30/2023)   HYDROcodone -acetaminophen  (NORCO) 10-325 MG tablet (Start on 12/30/2023)   Morbid obesity (HCC)   Chronic low back pain   Relevant Medications   HYDROcodone -acetaminophen  (NORCO) 10-325 MG tablet   HYDROcodone -acetaminophen  (NORCO) 10-325 MG tablet (Start on 11/30/2023)   HYDROcodone -acetaminophen  (NORCO) 10-325 MG tablet (Start on 12/30/2023)   Depression   Other insomnia   Other Visit Diagnoses       Other fatigue       Relevant Orders   Testosterone , Free and Total   Thyroid  Panel With TSH        Assessment and Plan Assessment & Plan Chronic pain and opioid dependence/DDD/OA/chronic low back pain Chronic pain management is suboptimal with inadequate pain control. Concerns about gabapentin 's side effects, including dementia-like symptoms and swelling,  which may contribute to symptoms. Interested in reducing medication burden. - Reduce gabapentin  to twice daily for one week, then once daily at bedtime for another week, and then discontinue. - Send hydrocodone  prescription to [facility] pharmacy. -increase hydrocodone  to 30 pills per month  Lower extremity edema, fatigue etiology under evaluation Persistent lower extremity edema with unknown etiology. Reports significant swelling and pain upon lowering legs after elevation. No blood clots identified. Gabapentin  may contribute to swelling but unlikely sole cause. Following conservative measures such as weight management, salt restriction, and leg elevation. - Order comprehensive metabolic panel (CMP) to assess kidney function and other potential causes of edema. -checking tsh and testosterone  - Continue conservative management with weight management, salt restriction, and leg elevation.  Type 2 diabetes mellitus Blood glucose levels generally well-controlled, though experiences reactive hyperglycemia when meals are skipped. On a regimen including metformin , glipizide , Tresiba , and Ozempic. Aware of the need to maintain blood glucose levels under 110 mg/dL.  Chronic kidney disease Chronic kidney disease is being monitored. On valsartan , which has been recently increased. - Order comprehensive metabolic panel (CMP) to assess kidney function.    Aortic atherosclerosis/hyperlipidemia -continue atorvastatin  40 mg daily -checking lipid panel today  HTN -managed by cardiology -continue valsartan  80 mg daily and metoprolol  50 mg daily -getting cmp  to check kidney function  Bph -stable no changes  Hyperaldosteronism -getting cmp  OSA -stable, no changes   Allergic rhinitis -continue flonase  as needed  Obesity Waist Measurement : 42 inches  -continue working on lifestyle modification Healthy Weight Loss Guide ?? Weight Loss Goal - Aim for 1-2 pounds per week - Target: 5-10% of your  starting body weight over 3-6 months ??? Nutrition Tips - Eat 3 meals per day and avoid skipping meals - Fill half your plate with vegetables, a quarter with protein, a quarter with whole grains - Choose lean proteins: chicken, fish, eggs, tofu, beans - Limit: - Sugary drinks (soda, sweet tea, juice) - Fried foods and fast food - Processed snacks (chips, candy, cookies) - Drink at least 64 oz of water  per day - Practice portion control and mindful eating ???? Lifestyle Habits - Track what you eat (apps like MyFitnessPal, Lose It!, or a paper log) - Get 7-9 hours of sleep per night - Manage stress (meditation, breathing exercises, counseling if needed) - Limit alcohol  (empty calories and may increase hunger) ???? Exercise Recommendations - Goal: 150 minutes per week of moderate activity (e.g., brisk walking, cycling) - Start with 10-15 minutes/day and build up gradually - Add 2  days per week of strength training (light weights, resistance bands, or bodyweight) ?? Remember: Progress > Perfection Small changes every day add up. Don't give up! - Avoid high-fat or greasy foods to reduce nausea - Focus on protein at each meal to preserve muscle mass - Stay well hydrated (at least 64 oz water  per day) - Limit sugar and processed carbohydrates ?? Managing Side Effects if on weight loss medication - Eat slowly and stop eating when you feel full - Use anti-nausea strategies: ginger tea, peppermint, crackers - Talk to your provider about adjusting the dose if needed - Stool softeners or fiber supplements can help with constipation ?? Staying on Track - Track weight and non-scale victories (energy, clothing fit, labs) - Follow up with your provider regularly - Don't stop medication without medical guidance - Combine medication with healthy habits for best results ?? Remember Weight loss medications are a tool, not a shortcut. Healthy habits matter. Be patient and consistent--small changes lead  to big results.      Follow up plan: Return in about 3 months (around 01/31/2024).

## 2023-11-01 ENCOUNTER — Ambulatory Visit: Payer: Self-pay | Admitting: Nurse Practitioner

## 2023-11-01 DIAGNOSIS — E291 Testicular hypofunction: Secondary | ICD-10-CM

## 2023-11-02 DIAGNOSIS — E119 Type 2 diabetes mellitus without complications: Secondary | ICD-10-CM | POA: Diagnosis not present

## 2023-11-04 LAB — COMPREHENSIVE METABOLIC PANEL WITH GFR
AG Ratio: 1.4 (calc) (ref 1.0–2.5)
ALT: 21 U/L (ref 9–46)
AST: 18 U/L (ref 10–35)
Albumin: 4.2 g/dL (ref 3.6–5.1)
Alkaline phosphatase (APISO): 101 U/L (ref 35–144)
BUN/Creatinine Ratio: 18 (calc) (ref 6–22)
BUN: 29 mg/dL — ABNORMAL HIGH (ref 7–25)
CO2: 29 mmol/L (ref 20–32)
Calcium: 10 mg/dL (ref 8.6–10.3)
Chloride: 105 mmol/L (ref 98–110)
Creat: 1.6 mg/dL — ABNORMAL HIGH (ref 0.70–1.28)
Globulin: 3 g/dL (ref 1.9–3.7)
Glucose, Bld: 88 mg/dL (ref 65–99)
Potassium: 4.3 mmol/L (ref 3.5–5.3)
Sodium: 146 mmol/L (ref 135–146)
Total Bilirubin: 0.4 mg/dL (ref 0.2–1.2)
Total Protein: 7.2 g/dL (ref 6.1–8.1)
eGFR: 45 mL/min/1.73m2 — ABNORMAL LOW (ref >=60)

## 2023-11-04 LAB — HEMOGLOBIN A1C
Hgb A1c MFr Bld: 7 % — ABNORMAL HIGH (ref <5.7)
Mean Plasma Glucose: 154 mg/dL
eAG (mmol/L): 8.5 mmol/L

## 2023-11-04 LAB — CBC WITH DIFFERENTIAL/PLATELET
Absolute Lymphocytes: 1773 {cells}/uL (ref 850–3900)
Absolute Monocytes: 310 {cells}/uL (ref 200–950)
Basophils Absolute: 62 {cells}/uL (ref 0–200)
Basophils Relative: 1 %
Eosinophils Absolute: 291 {cells}/uL (ref 15–500)
Eosinophils Relative: 4.7 %
HCT: 45.8 % (ref 38.5–50.0)
Hemoglobin: 14.7 g/dL (ref 13.2–17.1)
MCH: 31.6 pg (ref 27.0–33.0)
MCHC: 32.1 g/dL (ref 32.0–36.0)
MCV: 98.5 fL (ref 80.0–100.0)
MPV: 11 fL (ref 7.5–12.5)
Monocytes Relative: 5 %
Neutro Abs: 3763 {cells}/uL (ref 1500–7800)
Neutrophils Relative %: 60.7 %
Platelets: 209 Thousand/uL (ref 140–400)
RBC: 4.65 Million/uL (ref 4.20–5.80)
RDW: 13.4 % (ref 11.0–15.0)
Total Lymphocyte: 28.6 %
WBC: 6.2 Thousand/uL (ref 3.8–10.8)

## 2023-11-04 LAB — THYROID PANEL WITH TSH
Free Thyroxine Index: 2.2 (ref 1.4–3.8)
T3 Uptake: 31 % (ref 22–35)
T4, Total: 7.2 ug/dL (ref 4.9–10.5)
TSH: 3.16 m[IU]/L (ref 0.40–4.50)

## 2023-11-04 LAB — LIPID PANEL
Cholesterol: 136 mg/dL (ref <200)
HDL: 34 mg/dL — ABNORMAL LOW (ref >=40)
LDL Cholesterol (Calc): 74 mg/dL
Non-HDL Cholesterol (Calc): 102 mg/dL (ref <130)
Total CHOL/HDL Ratio: 4 (calc) (ref <5.0)
Triglycerides: 180 mg/dL — ABNORMAL HIGH (ref <150)

## 2023-11-04 LAB — TESTOSTERONE, FREE & TOTAL
Free Testosterone: 34.2 pg/mL (ref 30.0–135.0)
Testosterone, Total, LC-MS-MS: 166 ng/dL — ABNORMAL LOW (ref 250–1100)

## 2023-11-16 ENCOUNTER — Encounter: Payer: Self-pay | Admitting: Nurse Practitioner

## 2023-11-28 ENCOUNTER — Encounter (INDEPENDENT_AMBULATORY_CARE_PROVIDER_SITE_OTHER)

## 2023-11-28 ENCOUNTER — Ambulatory Visit (INDEPENDENT_AMBULATORY_CARE_PROVIDER_SITE_OTHER): Admitting: Vascular Surgery

## 2023-11-29 ENCOUNTER — Ambulatory Visit (INDEPENDENT_AMBULATORY_CARE_PROVIDER_SITE_OTHER): Admitting: Vascular Surgery

## 2023-11-29 ENCOUNTER — Encounter (INDEPENDENT_AMBULATORY_CARE_PROVIDER_SITE_OTHER): Payer: Self-pay | Admitting: Vascular Surgery

## 2023-11-29 VITALS — BP 176/97 | HR 72 | Ht 75.0 in | Wt 255.6 lb

## 2023-11-29 DIAGNOSIS — E785 Hyperlipidemia, unspecified: Secondary | ICD-10-CM | POA: Diagnosis not present

## 2023-11-29 DIAGNOSIS — I890 Lymphedema, not elsewhere classified: Secondary | ICD-10-CM

## 2023-11-29 DIAGNOSIS — I10 Essential (primary) hypertension: Secondary | ICD-10-CM | POA: Diagnosis not present

## 2023-11-29 DIAGNOSIS — E1169 Type 2 diabetes mellitus with other specified complication: Secondary | ICD-10-CM | POA: Diagnosis not present

## 2023-11-29 NOTE — Progress Notes (Signed)
 Subjective:    Patient ID: Evan Rivas, male    DOB: 03-20-1951, 73 y.o.   MRN: [id number] Chief Complaint  Patient presents with   Follow-up    3 month follow up     Hassaan Crite is a 73 yo male who returns to clinic today for a 3 month follow up for bilateral lower extremity lymphedema. He endorses he is doing very well. He has been very compliant with compression and elevation of his legs. He continues to have some swelling bilaterally at the lower calves, ankles and feet. He is not having the same difficulties walking. He also endorses he did follow up with his PCP for an assessment. He is noted to have some low testosterone  but his thyroid  studies are normal. This may have been contributing to his not being able to sleep. He does not endorse any sores or skin difficulties. He endorses he is walking multiple times a day. He has considerable decrease in his lymphedema today.       Review of Systems  Constitutional: Negative.   Cardiovascular:  Positive for leg swelling.  All other systems reviewed and are negative.      Objective:   Physical Exam Vitals reviewed.  Constitutional:      Appearance: Normal appearance. He is obese.  HENT:     Head: Normocephalic.  Eyes:     Pupils: Pupils are equal, round, and reactive to light.  Cardiovascular:     Rate and Rhythm: Normal rate and regular rhythm.     Pulses: Normal pulses.     Heart sounds: Normal heart sounds.  Pulmonary:     Effort: Pulmonary effort is normal.     Breath sounds: Normal breath sounds.  Abdominal:     General: Bowel sounds are normal.     Palpations: Abdomen is soft.  Musculoskeletal:        General: Tenderness present.     Right lower leg: Edema present.     Left lower leg: Edema present.  Skin:    General: Skin is warm and dry.     Capillary Refill: Capillary refill takes 2 to 3 seconds.  Neurological:     General: No focal deficit present.     Mental Status: He is alert and oriented to  person, place, and time. Mental status is at baseline.  Psychiatric:        Mood and Affect: Mood normal.        Behavior: Behavior normal.        Thought Content: Thought content normal.        Judgment: Judgment normal.     BP (!) 176/97   Pulse 72   Ht 6' 3 (1.905 m)   Wt 255 lb 9.6 oz (115.9 kg)   BMI 31.95 kg/m   Past Medical History:  Diagnosis Date   Adrenal hyperplasia (HCC)    Anxiety    panic attacks   Arthritis    Basal cell carcinoma 2015   R upper lip - tx with MOHS by Dr. Bluford   Basal cell carcinoma 08/25/2021   BCC, nasal tip, Mohs completed on 09/30/21   CKD (chronic kidney disease), stage III (HCC)    Depression    Diastolic dysfunction    a.02/2018 Echo: LVEF 60-65%, mild LVH, G2DD, mild MR, mildly dilated LA, RV cavity size/wall thickness/systolic function normal; b. 01/2023 Echo: EF 60-65%, no rwma, GrI DD, nl RV fxn, no significant valvular disease.   GERD (gastroesophageal reflux  disease)    Gout    History of kidney stones    History of stress test    a. 01/2023 Lexiscan  PET Stress: EF 68% without evidence of ischemia or infarct.  No significant coronary calcium .   Hyperaldosteronism (HCC)    a.) secondary to BILATERAL adrenal hyperplasia   Hyperlipidemia    Hypertension    Nerve entrapment syndrome of lower extremity, right 10/24/2019   Pneumonia    2014 and 2018 ( 2018-klebsiella pneumonia    PSVT (paroxysmal supraventricular tachycardia) (HCC)    a. 02/2023 Zio: 9 SVT runs lasting up to 2 minutes and 51 seconds at a maximal rate of 148 bpm.   Sleep apnea    Syncope    a. 02/2023 Zio: Predominantly normal rhythm, average 77 (55-100).  Occasional PACs (1.1% burden).  9 SVT runs, longest 2 minutes and 51 seconds at a maximum rate of 148.  Intermittent Mobitz 1.  No prolonged pauses.  No triggered events.   T2DM (type 2 diabetes mellitus) (HCC)    Venous reflux    a. 08/2023 LE Venous duplex: No DVT.  Mild bilateral venous insufficiency in the  greater saphenous veins in the calves.  Venous reflux noted in the left short saphenous vein.    Social History   Socioeconomic History   Marital status: Married    Spouse name: Elveria   Number of children: 1   Years of education: Not on file   Highest education level: Some college, no degree  Occupational History   Occupation: retired  Tobacco Use   Smoking status: Never   Smokeless tobacco: Never  Vaping Use   Vaping status: Never Used  Substance and Sexual Activity   Alcohol  use: No   Drug use: No   Sexual activity: Not Currently    Birth control/protection: None  Other Topics Concern   Not on file  Social History Narrative   Not on file   Social Drivers of Health   Financial Resource Strain: Low Risk  (10/31/2023)   Overall Financial Resource Strain (CARDIA)    Difficulty of Paying Living Expenses: Not very hard  Food Insecurity: No Food Insecurity (10/31/2023)   Hunger Vital Sign    Worried About Running Out of Food in the Last Year: Never true    Ran Out of Food in the Last Year: Never true  Transportation Needs: No Transportation Needs (10/31/2023)   PRAPARE - Administrator, Civil Service (Medical): No    Lack of Transportation (Non-Medical): No  Physical Activity: Insufficiently Active (10/31/2023)   Exercise Vital Sign    Days of Exercise per Week: 1 day    Minutes of Exercise per Session: 20 min  Stress: Stress Concern Present (10/31/2023)   [employer] of Occupational Health - Occupational Stress Questionnaire    Feeling of Stress: To some extent  Social Connections: Moderately Isolated (10/31/2023)   Social Connection and Isolation Panel    Frequency of Communication with Friends and Family: More than three times a week    Frequency of Social Gatherings with Friends and Family: Twice a week    Attends Religious Services: Never    Database administrator or Organizations: No    Attends Engineer, structural: Not on file    Marital  Status: Married  Catering manager Violence: Not At Risk (07/27/2023)   Humiliation, Afraid, Rape, and Kick questionnaire    Fear of Current or Ex-Partner: No    Emotionally Abused: No  Physically Abused: No    Sexually Abused: No    Past Surgical History:  Procedure Laterality Date   ARTHROSCOPIC REPAIR ACL Left    CIRCUMCISION N/A 01/17/2021   Procedure: CIRCUMCISION ADULT;  Surgeon: Penne Knee, MD;  Location: [facility];  Service: Urology;  Laterality: N/A;   CYSTOSCOPY W/ URETERAL STENT PLACEMENT Right 09/07/2014   Procedure: CYSTOSCOPY WITH STENT REPLACEMENT;  Surgeon: Knee Penne, MD;  Location: [facility];  Service: Urology;  Laterality: Right;   CYSTOSCOPY WITH INSERTION OF UROLIFT N/A 11/24/2019   Procedure: CYSTOSCOPY WITH INSERTION OF UROLIFT;  Surgeon: Penne Knee, MD;  Location: [facility];  Service: Urology;  Laterality: N/A;   CYSTOSCOPY/URETEROSCOPY/HOLMIUM LASER/STENT PLACEMENT Right 08/17/2014   Procedure: CYSTOSCOPY/URETEROSCOPY//STENT PLACEMENT/ attempt of lithotripsy;  Surgeon: Knee Penne, MD;  Location: [facility];  Service: Urology;  Laterality: Right;   HERNIA REPAIR     JOINT REPLACEMENT     Bilat knees and right hip   MOHS SURGERY     lip   TOTAL HIP ARTHROPLASTY Right 06/16/2020   Procedure: TOTAL HIP ARTHROPLASTY ANTERIOR APPROACH;  Surgeon: Melodi Lerner, MD;  Location: [facility];  Service: Orthopedics;  Laterality: Right;    TOTAL KNEE ARTHROPLASTY Left 04/24/2016   Procedure: LEFT TOTAL KNEE ARTHROPLASTY;  Surgeon: Lerner Melodi, MD;  Location: [facility];  Service: Orthopedics;  Laterality: Left;  Adductor Block   TOTAL KNEE ARTHROPLASTY Right 10/23/2016   Procedure: RIGHT TOTAL KNEE ARTHROPLASTY;  Surgeon: Melodi Lerner, MD;  Location: [facility];  Service: Orthopedics;  Laterality: Right;   URETEROSCOPY WITH HOLMIUM LASER LITHOTRIPSY Right 09/07/2014   Procedure: URETEROSCOPY WITH HOLMIUM LASER LITHOTRIPSY;  Surgeon: Knee Penne, MD;   Location: [facility];  Service: Urology;  Laterality: Right;   VASECTOMY      Family History  Problem Relation Age of Onset   Hypertension Mother    Cancer Mother    Hyperlipidemia Mother    Arthritis Mother    Stroke Father    Heart disease Father    Obesity Father    Depression Sister    Hypertension Sister    Hyperlipidemia Sister    Learning disabilities Sister    Obesity Sister    Kidney disease Maternal Grandmother     Allergies  Allergen Reactions   Bee Venom Other (See Comments)   Amlodipine      Ankle swelling   Celebrex [Celecoxib]     GI issues   Tetanus Antitoxin Other (See Comments)   Tetanus Toxoids Other (See Comments)    Unkown   Tetanus-Diphtheria Toxoids Td     Unknown       Latest Ref Rng & Units 10/31/2023   11:12 AM 01/03/2023   12:27 PM 07/28/2022   10:39 AM  CBC  WBC 3.8 - 10.8 Thousand/uL 6.2  5.8  6.8   Hemoglobin 13.2 - 17.1 g/dL 85.2  84.9  84.8   Hematocrit 38.5 - 50.0 % 45.8  44.3  45.2   Platelets 140 - 400 Thousand/uL 209  237  224       CMP     Component Value Date/Time   NA 146 10/31/2023 1112   NA 146 (H) 08/31/2023 0936   K 4.3 10/31/2023 1112   CL 105 10/31/2023 1112   CO2 29 10/31/2023 1112   GLUCOSE 88 10/31/2023 1112   BUN 29 (H) 10/31/2023 1112   BUN 28 (H) 08/31/2023 0936   CREATININE 1.60 (H) 10/31/2023 1112   CALCIUM  10.0 10/31/2023 1112   PROT  7.2 10/31/2023 1112   PROT 6.3 01/03/2023 1227   ALBUMIN 3.7 01/04/2023 1024   ALBUMIN 3.9 01/03/2023 1227   AST 18 10/31/2023 1112   ALT 21 10/31/2023 1112   ALKPHOS 73 01/04/2023 1024   BILITOT 0.4 10/31/2023 1112   BILITOT 0.3 01/03/2023 1227   EGFR 45 (L) 10/31/2023 1112   EGFR 51 (L) 08/31/2023 0936   GFRNONAA 41 (L) 01/04/2023 1024   GFRNONAA 44 (L) 03/16/2020 0000     No results found.     Assessment & Plan:   1. Lymphedema (Primary) Patient returns today to clinic for 46-month follow-up after the initiation of conservative therapy for bilateral lower  extremity lymphedema.  Patient is doing very well.  He is very compliant with all his conservative therapy.  This is visualized by much less swelling in his bilateral lower extremities.  However he still continues to have some swelling around his ankles and his feet.  He is walking more now but it is not as uncomfortable to do so.  He recently followed up with his primary care physician for an evaluation.  They are continuing to work with him on his blood sugars, hypertension, weight and testosterone  levels to try to help improve his overall health care, which will help improve his bilateral lower extremity swelling.  I recommend at this time that he continue with the conservative therapy.  I recommended as he continues to decrease the swelling to his lower extremities and his feet he try to increase his exercise.  I recommend he continue to follow the recommendations from his primary care physician regarding all of his other health issues as they appear to be making a difference.  Overall the patient seems happier today.  Plan going forward is the patient will follow-up with me in 3 months without any studies.  We will continue to monitor his progress.  Patient was in agreement with the plan.  2. Essential hypertension Continue antihypertensive medications as already ordered, these medications have been reviewed and there are no changes at this time.  3. Hyperlipidemia associated with type 2 diabetes mellitus (HCC) Continue statin as ordered and reviewed, no changes at this time   Current Outpatient Medications on File Prior to Visit  Medication Sig Dispense Refill   aspirin  EC 81 MG tablet Take 81 mg by mouth daily. Swallow whole.     atorvastatin  (LIPITOR) 40 MG tablet Take 1 tablet (40 mg total) by mouth at bedtime. 90 tablet 1   Cholecalciferol 50 MCG (2000 UT) TABS Take 1 tablet by mouth daily.     clotrimazole  (LOTRIMIN ) 1 % cream Apply 1 application topically 2 (two) times daily. Apply for  7-10 days. 28 g 1   dapagliflozin  propanediol (FARXIGA ) 10 MG TABS tablet Take 1 tablet (10 mg total) by mouth daily before breakfast. Patient receives via [place] Patient Assistance Program 90 tablet 3   fluticasone  (FLONASE ) 50 MCG/ACT nasal spray USE 2 SPRAYS IN EACH NOSTRIL DAILY 16 g 0   gabapentin  (NEURONTIN ) 600 MG tablet TAKE 1 TABLET THREE TIMES A DAY 270 tablet 1   glipiZIDE  (GLUCOTROL ) 10 MG tablet TAKE 1 TABLET DAILY BEFORE BREAKFAST 90 tablet 1   glucose blood (TRUE METRIX BLOOD GLUCOSE TEST) test strip Test  blood sugar twice a day 100 each 12   HYDROcodone -acetaminophen  (NORCO) 10-325 MG tablet Take 0.5-1 tablets by mouth every 8 (eight) hours as needed for severe pain (pain score 7-10). Gradually reducing dose 30 tablet 0   [START  ON 11/30/2023] HYDROcodone -acetaminophen  (NORCO) 10-325 MG tablet Take 0.5-1 tablets by mouth every 8 (eight) hours as needed for severe pain (pain score 7-10). Gradually reducing dose 30 tablet 0   [START ON 12/30/2023] HYDROcodone -acetaminophen  (NORCO) 10-325 MG tablet Take 0.5-1 tablets by mouth every 8 (eight) hours as needed for severe pain (pain score 7-10). Gradually reducing dose 30 tablet 0   insulin  aspart (NOVOLOG  FLEXPEN) 100 UNIT/ML FlexPen per sliding scale three times daily with meals   Pre-meal insulin  : Check fsbs prior to meals Hold if below 110 If glucose 110- 140 give 4 units If glucose 140 to 180 give 6 units If glucose 180 to 220  give 8 units If glucose above 220 give 10 units 15 mL 11   insulin  degludec (TRESIBA  FLEXTOUCH) 100 UNIT/ML FlexTouch Pen Inject 20 Units into the skin daily. Total daily dose = 0.2-0.5 units/kg/day---> 50% Basal/50% Bolus     ketoconazole  (NIZORAL ) 2 % cream Apply to the feet QHS. 60 g 6   melatonin 3 MG TABS tablet Take 3 mg by mouth at bedtime.     metFORMIN  (GLUCOPHAGE ) 1000 MG tablet TAKE 1 TABLET TWICE A DAY WITH MEALS 180 tablet 0   metoprolol  succinate (TOPROL -XL) 50 MG 24 hr tablet Take 1 tablet (50 mg  total) by mouth daily. Take with or immediately following a meal. 90 tablet 3   Multiple Vitamin (MULTIVITAMIN) tablet Take 1 tablet by mouth daily.     Semaglutide (OZEMPIC, 1 MG/DOSE, [place]) Inject 1 mg into the skin once a week.     sildenafil  (REVATIO ) 20 MG tablet Take 1 tablet (20 mg total) by mouth as needed. Take 1-5 tabs as needed prior to intercourse 30 tablet 11   tiZANidine  (ZANAFLEX ) 4 MG tablet TAKE 1 TABLET EVERY 6 HOURS AS NEEDED FOR MUSCLE SPASMS 270 tablet 4   torsemide  (DEMADEX ) 20 MG tablet 1 tab daily with 1 additional tab every MWF 130 tablet 3   valsartan  (DIOVAN ) 80 MG tablet Take 1 tablet (80 mg total) by mouth daily. 90 tablet 3   No current facility-administered medications on file prior to visit.    There are no Patient Instructions on file for this visit. No follow-ups on file.   Gwendlyn JONELLE Shank, NP

## 2023-12-03 DIAGNOSIS — E119 Type 2 diabetes mellitus without complications: Secondary | ICD-10-CM | POA: Diagnosis not present

## 2023-12-09 NOTE — Progress Notes (Unsigned)
 Cardiology Office Note    Date:  12/10/2023   ID:  Evan Rivas, DOB 12/29/1950, MRN [id number]  PCP:  Gareth Mliss FALCON, FNP  Cardiologist:  Lonni Hanson, MD  Electrophysiologist:  None   Chief Complaint: Follow up  History of Present Illness:   Evan Rivas is a 73 y.o. male with history of diastolic dysfunction, hyperaldosteronism secondary to adrenal hyperplasia, venous insufficiency, DM2, HTN, HLD, cancer of the lip, gout, anxiety, depression, OSA intolerant to CPAP, and arthritis with chronic pain who presents for follow-up of diastolic dysfunction.   Prior echo from 2019 showed an EF of 60 to 65%, mild LVH, no regional wall motion abnormalities, grade 2 diastolic dysfunction, mild mitral regurgitation, mildly dilated left atrium, and normal RV systolic function and ventricular cavity size.  Renal artery ultrasound in 03/2018 showed no evidence of RAS.  He was seen in the office in 09/2021 following a recent ED visit for near syncope that occurred having been out of town and not having had much to eat or drink.  ED evaluation was unremarkable.  No interventions were indicated at that time.  He was seen in our office on 01/03/2023 noting a year-long history of progressive fatigue, weakness, and dyspnea, particularly with exertion.  These symptoms have significantly impacted his daily activities, ultimately leading to his cessation of golf.  He reported an episode of syncope while in [place] , that occurred while seated, and had an unrevealing extensive workup.  He also reported an episode of syncope while playing golf, leaning over to pick up his ball on the green, and subsequently passing out.  He noted a significant increase in glucose levels around that time trending from 175 to greater than 300.  Blood pressure was soft at 94/68.  At that time, it was recommended he take half of a valsartan /HCTZ.  11 subsequent labs showed AKI and hyperkalemia with recommendation for ED evaluation.   He was evaluated in the ED with repeat labs showing an improved potassium of 4.7 and improving serum creatinine.  HCTZ was subsequently discontinued with recommendation for patient to take valsartan  40 mg and patient reports metoprolol  50 mg.   He underwent myocardial PET/CT showed no evidence of ischemia or infarction with an EF of 65% and was overall low risk.  No evidence of coronary artery calcification on CT imaging.  Echo 01/23/2023 showed an EF of 60 to 65%, no regional wall motion abnormality, grade 1 diastolic dysfunction, normal RV systolic function and ventricular cavity size, no significant valvular abnormalities, and an estimated right atrial pressure of 3 mmHg.   He was seen in the office on 02/19/2023 continuing to note exertional fatigue, weakness, and dyspnea without frank chest pain.  No presyncope or syncope.  He also reported a longstanding history of lower extremity swelling with the left lower extremity typically being more noticeable than the right.  Previously noted improvement in swelling with compression socks and leg elevation.  In the setting of elevated blood pressure he was rechallenged with amlodipine  5 mg.  Zio patch demonstrated a predominant rhythm of sinus with an average rate of 77 bpm with occasional PACs with an overall burden of 1.1%, rare PVCs, 9 episodes of SVT lasting up to 2 minutes and 51 seconds with a maximum rate of 148 bpm, and intermittent second-degree AV block type I with no pauses greater than 3 seconds.  Metoprolol  was subsequently reduced to 25 mg daily.   He was seen in the office in 03/2023 and  reported nephrology had started torsemide  20 mg 3 days/week.  He also reported to self initiating spironolactone  25 mg twice daily 4 days/week.  Lower extremity swelling was improved.     He was seen in the office in 06/2023 and continued to note chronic lower extremity swelling with the left lower extremity being worse than the right.  Swelling was no worse  following the reinitiation of amlodipine .  He was taking torsemide  20 mg 3 days/week and was no longer on spironolactone .  CT of the abdomen/pelvis in 06/2023, to evaluate for compressive process contributing to lower extremity swelling showed no acute findings in the abdomen or pelvis with incidental findings noted in the report.  Lower extremity venous duplex negative for DVT bilaterally on 07/19/2023.  Lower extremity ultrasound in 08/2023 notable for bilateral venous reflux.  Despite taking torsemide , wearing compression socks, and keeping his legs elevated as much as possible he has continued to have lower extremity edema.  He was eating frequent processed and fast foods, which he indicated he did not plan on changing at his follow-up in 08/2023.  He was referred to vascular surgery and torsemide  was increased to 20 mg daily with an additional 20 mg on Mondays, Wednesdays, and Fridays.  He was last seen in the office on 10/23/2023 noting an improvement in lower extremity swelling with newer compression socks.  Vascular surgery recommended conservative management with deferment of lymphedema pumps.  In the setting of elevated BP at 150/84, valsartan  was titrated to 80 mg daily with continuation of Toprol -XL 50 mg.  Subsequently showed increase in his serum creatinine leading his torsemide  to be transitioned to every other day.  He comes in doing well from a cardiac perspective and is without symptoms of angina or cardiac decompensation.  No dizziness, presyncope, or syncope.  Lower extremity swelling continues to improve with lifestyle modification and new compression socks.  Currently taking torsemide  days, Wednesdays, and Fridays.  Would like to become less sedentary.  Does not have any acute cardiac concerns at this time.   Labs independently reviewed: 10/2023 - A1c 7.0, TC 136, TG 180, HDL 34, LDL 74, BUN 29, serum creatinine 1.6, potassium 4.3, albumin 4.2, AST/ALT normal, Hgb 14.7, PLT 209, TSH  normal  Past Medical History:  Diagnosis Date   Adrenal hyperplasia (HCC)    Anxiety    panic attacks   Arthritis    Basal cell carcinoma 2015   R upper lip - tx with MOHS by Dr. Bluford   Basal cell carcinoma 08/25/2021   BCC, nasal tip, Mohs completed on 09/30/21   CKD (chronic kidney disease), stage III (HCC)    Depression    Diastolic dysfunction    a.02/2018 Echo: LVEF 60-65%, mild LVH, G2DD, mild MR, mildly dilated LA, RV cavity size/wall thickness/systolic function normal; b. 01/2023 Echo: EF 60-65%, no rwma, GrI DD, nl RV fxn, no significant valvular disease.   GERD (gastroesophageal reflux disease)    Gout    History of kidney stones    History of stress test    a. 01/2023 Lexiscan  PET Stress: EF 68% without evidence of ischemia or infarct.  No significant coronary calcium .   Hyperaldosteronism (HCC)    a.) secondary to BILATERAL adrenal hyperplasia   Hyperlipidemia    Hypertension    Nerve entrapment syndrome of lower extremity, right 10/24/2019   Pneumonia    2014 and 2018 ( 2018-klebsiella pneumonia    PSVT (paroxysmal supraventricular tachycardia) (HCC)    a. 02/2023 Zio: 9  SVT runs lasting up to 2 minutes and 51 seconds at a maximal rate of 148 bpm.   Sleep apnea    Syncope    a. 02/2023 Zio: Predominantly normal rhythm, average 77 (55-100).  Occasional PACs (1.1% burden).  9 SVT runs, longest 2 minutes and 51 seconds at a maximum rate of 148.  Intermittent Mobitz 1.  No prolonged pauses.  No triggered events.   T2DM (type 2 diabetes mellitus) (HCC)    Venous reflux    a. 08/2023 LE Venous duplex: No DVT.  Mild bilateral venous insufficiency in the greater saphenous veins in the calves.  Venous reflux noted in the left short saphenous vein.    Past Surgical History:  Procedure Laterality Date   ARTHROSCOPIC REPAIR ACL Left    CIRCUMCISION N/A 01/17/2021   Procedure: CIRCUMCISION ADULT;  Surgeon: Penne Knee, MD;  Location: [facility];  Service: Urology;   Laterality: N/A;   CYSTOSCOPY W/ URETERAL STENT PLACEMENT Right 09/07/2014   Procedure: CYSTOSCOPY WITH STENT REPLACEMENT;  Surgeon: Knee Penne, MD;  Location: [facility];  Service: Urology;  Laterality: Right;   CYSTOSCOPY WITH INSERTION OF UROLIFT N/A 11/24/2019   Procedure: CYSTOSCOPY WITH INSERTION OF UROLIFT;  Surgeon: Penne Knee, MD;  Location: [facility];  Service: Urology;  Laterality: N/A;   CYSTOSCOPY/URETEROSCOPY/HOLMIUM LASER/STENT PLACEMENT Right 08/17/2014   Procedure: CYSTOSCOPY/URETEROSCOPY//STENT PLACEMENT/ attempt of lithotripsy;  Surgeon: Knee Penne, MD;  Location: [facility];  Service: Urology;  Laterality: Right;   HERNIA REPAIR     JOINT REPLACEMENT     Bilat knees and right hip   MOHS SURGERY     lip   TOTAL HIP ARTHROPLASTY Right 06/16/2020   Procedure: TOTAL HIP ARTHROPLASTY ANTERIOR APPROACH;  Surgeon: Melodi Lerner, MD;  Location: [facility];  Service: Orthopedics;  Laterality: Right;    TOTAL KNEE ARTHROPLASTY Left 04/24/2016   Procedure: LEFT TOTAL KNEE ARTHROPLASTY;  Surgeon: Lerner Melodi, MD;  Location: [facility];  Service: Orthopedics;  Laterality: Left;  Adductor Block   TOTAL KNEE ARTHROPLASTY Right 10/23/2016   Procedure: RIGHT TOTAL KNEE ARTHROPLASTY;  Surgeon: Melodi Lerner, MD;  Location: [facility];  Service: Orthopedics;  Laterality: Right;   URETEROSCOPY WITH HOLMIUM LASER LITHOTRIPSY Right 09/07/2014   Procedure: URETEROSCOPY WITH HOLMIUM LASER LITHOTRIPSY;  Surgeon: Knee Penne, MD;  Location: [facility];  Service: Urology;  Laterality: Right;   VASECTOMY      Current Medications: Current Meds  Medication Sig   aspirin  EC 81 MG tablet Take 81 mg by mouth daily. Swallow whole.   atorvastatin  (LIPITOR) 40 MG tablet Take 1 tablet (40 mg total) by mouth at bedtime.   Cholecalciferol 50 MCG (2000 UT) TABS Take 1 tablet by mouth daily.   clotrimazole  (LOTRIMIN ) 1 % cream Apply 1 application topically 2 (two) times daily. Apply for 7-10 days.    dapagliflozin  propanediol (FARXIGA ) 10 MG TABS tablet Take 1 tablet (10 mg total) by mouth daily before breakfast. Patient receives via [place] Patient Assistance Program   fluticasone  (FLONASE ) 50 MCG/ACT nasal spray USE 2 SPRAYS IN EACH NOSTRIL DAILY   gabapentin  (NEURONTIN ) 600 MG tablet TAKE 1 TABLET THREE TIMES A DAY   glipiZIDE  (GLUCOTROL ) 10 MG tablet TAKE 1 TABLET DAILY BEFORE BREAKFAST   glucose blood (TRUE METRIX BLOOD GLUCOSE TEST) test strip Test  blood sugar twice a day   HYDROcodone -acetaminophen  (NORCO) 10-325 MG tablet Take 0.5-1 tablets by mouth every 8 (eight) hours as needed for severe pain (pain score 7-10). Gradually reducing dose  HYDROcodone -acetaminophen  (NORCO) 10-325 MG tablet Take 0.5-1 tablets by mouth every 8 (eight) hours as needed for severe pain (pain score 7-10). Gradually reducing dose   [START ON 12/30/2023] HYDROcodone -acetaminophen  (NORCO) 10-325 MG tablet Take 0.5-1 tablets by mouth every 8 (eight) hours as needed for severe pain (pain score 7-10). Gradually reducing dose   insulin  aspart (NOVOLOG  FLEXPEN) 100 UNIT/ML FlexPen per sliding scale three times daily with meals   Pre-meal insulin  : Check fsbs prior to meals Hold if below 110 If glucose 110- 140 give 4 units If glucose 140 to 180 give 6 units If glucose 180 to 220  give 8 units If glucose above 220 give 10 units   insulin  degludec (TRESIBA  FLEXTOUCH) 100 UNIT/ML FlexTouch Pen Inject 20 Units into the skin daily. Total daily dose = 0.2-0.5 units/kg/day---> 50% Basal/50% Bolus   ketoconazole  (NIZORAL ) 2 % cream Apply to the feet QHS.   melatonin 3 MG TABS tablet Take 3 mg by mouth at bedtime.   metFORMIN  (GLUCOPHAGE ) 1000 MG tablet TAKE 1 TABLET TWICE A DAY WITH MEALS   metoprolol  succinate (TOPROL -XL) 50 MG 24 hr tablet Take 1 tablet (50 mg total) by mouth daily. Take with or immediately following a meal.   Multiple Vitamin (MULTIVITAMIN) tablet Take 1 tablet by mouth daily.   Semaglutide (OZEMPIC, 1  MG/DOSE, [place]) Inject 1 mg into the skin once a week.   sildenafil  (REVATIO ) 20 MG tablet Take 1 tablet (20 mg total) by mouth as needed. Take 1-5 tabs as needed prior to intercourse   tiZANidine  (ZANAFLEX ) 4 MG tablet TAKE 1 TABLET EVERY 6 HOURS AS NEEDED FOR MUSCLE SPASMS   torsemide  (DEMADEX ) 20 MG tablet 1 tab daily with 1 additional tab every MWF   valsartan  (DIOVAN ) 80 MG tablet Take 1 tablet (80 mg total) by mouth daily.    Allergies:   Bee venom, Amlodipine , Celebrex [celecoxib], Tetanus antitoxin, Tetanus toxoid-containing vaccines, and Tetanus-diphtheria toxoids td   Social History   Socioeconomic History   Marital status: Married    Spouse name: Elveria   Number of children: 1   Years of education: Not on file   Highest education level: Some college, no degree  Occupational History   Occupation: retired  Tobacco Use   Smoking status: Never   Smokeless tobacco: Never  Vaping Use   Vaping status: Never Used  Substance and Sexual Activity   Alcohol  use: No   Drug use: No   Sexual activity: Not Currently    Birth control/protection: None  Other Topics Concern   Not on file  Social History Narrative   Not on file   Social Drivers of Health   Financial Resource Strain: Low Risk  (10/31/2023)   Overall Financial Resource Strain (CARDIA)    Difficulty of Paying Living Expenses: Not very hard  Food Insecurity: No Food Insecurity (10/31/2023)   Hunger Vital Sign    Worried About Running Out of Food in the Last Year: Never true    Ran Out of Food in the Last Year: Never true  Transportation Needs: No Transportation Needs (10/31/2023)   PRAPARE - Administrator, Civil Service (Medical): No    Lack of Transportation (Non-Medical): No  Physical Activity: Insufficiently Active (10/31/2023)   Exercise Vital Sign    Days of Exercise per Week: 1 day    Minutes of Exercise per Session: 20 min  Stress: Stress Concern Present (10/31/2023)   [employer] of  Occupational Health - Occupational Stress Questionnaire  Feeling of Stress: To some extent  Social Connections: Moderately Isolated (10/31/2023)   Social Connection and Isolation Panel    Frequency of Communication with Friends and Family: More than three times a week    Frequency of Social Gatherings with Friends and Family: Twice a week    Attends Religious Services: Never    Database administrator or Organizations: No    Attends Engineer, structural: Not on file    Marital Status: Married     Family History:  The patient's family history includes Arthritis in his mother; Cancer in his mother; Depression in his sister; Heart disease in his father; Hyperlipidemia in his mother and sister; Hypertension in his mother and sister; Kidney disease in his maternal grandmother; Learning disabilities in his sister; Obesity in his father and sister; Stroke in his father.  ROS:   12-point review of systems is negative unless otherwise noted in the HPI.   EKGs/Labs/Other Studies Reviewed:    Studies reviewed were summarized above. The additional studies were reviewed today:  2D echo 02/2018: - Left ventricle: The cavity size was normal. Wall thickness was   increased in a pattern of mild LVH. Systolic function was normal.   The estimated ejection fraction was in the range of 60% to 65%.   Wall motion was normal; there were no regional wall motion   abnormalities. Features are consistent with a pseudonormal left   ventricular filling pattern, with concomitant abnormal relaxation   and increased filling pressure (grade 2 diastolic dysfunction). - Mitral valve: There was mild regurgitation. - Left atrium: The atrium was mildly dilated. - Right ventricle: The cavity size was normal. Wall thickness was   normal. Systolic function was normal. __________   Renal artery ultrasound 03/2018: Summary: Largest Aortic Diameter: 2.0 cm   Renal:   Right: Normal size right kidney. Normal  right Resisitive Index.        Abnormal cortical thickness of right kidney. No evidence of        right renal artery stenosis. RRV flow present. Left:  LRV flow present. No evidence of left renal artery stenosis.        Normal size of left kidney. Normal left Resistive Index.        Normal cortical thickness of the left kidney. Mesenteric: Normal Celiac artery and Superior Mesenteric artery findings. __________   Myocardial/CT 01/18/2023:   LV perfusion is normal. There is no evidence of ischemia. There is no evidence of infarction.   Stress left ventricular function is normal. Stress EF: 68%. Resting EF not accurate due to poor gating and not reported End diastolic cavity size is normal. End systolic cavity size is normal.   Myocardial blood flow was computed to be 0.72ml/g/min at rest and 2.90ml/g/min at stress. Global myocardial blood flow reserve was 2.45 and was normal.   Coronary calcium  was absent on the attenuation correction CT images.   The study is normal. The study is low risk. ___________   2D echo 01/23/2023: 1. Left ventricular ejection fraction, by estimation, is 60 to 65%. The  left ventricle has normal function. The left ventricle has no regional  wall motion abnormalities. Left ventricular diastolic parameters are  consistent with Grade I diastolic  dysfunction (impaired relaxation).   2. Right ventricular systolic function is normal. The right ventricular  size is normal. Tricuspid regurgitation signal is inadequate for assessing  PA pressure.   3. The mitral valve is normal in structure. No evidence of mitral  valve  regurgitation. No evidence of mitral stenosis.   4. The aortic valve is tricuspid. Aortic valve regurgitation is not  visualized. No aortic stenosis is present.   5. The inferior vena cava is normal in size with greater than 50%  respiratory variability, suggesting right atrial pressure of 3 mmHg.  __________   Zio patch 02/2023:   The patient was  monitored for 14 days.   The predominant rhythm was sinus with an average rate of 77 bpm (rante 55-100 bpm in sinus).   There were occasional PAC's (1.1% burden) and rare PVC's.   9 supraventricular runs were observed, lasting up to 2 minutes, 51 seconds with a maximum rate of 148 bpm.   Intermittent Mobitz type 1 second degree AV block was seen.  There was no pause greater than 3 seconds.   No patient triggered events occurred.   Predominantly sinus rhythm with PSVT and intermittent Mobitz type 1 second degree AV block as well as occasional PAC's (see details above).   EKG:  EKG is not ordered today.    Recent Labs: 10/31/2023: ALT 21; BUN 29; Creat 1.60; Hemoglobin 14.7; Platelets 209; Potassium 4.3; Sodium 146; TSH 3.16  Recent Lipid Panel    Component Value Date/Time   CHOL 136 10/31/2023 1112   CHOL 140 09/17/2017 1014   TRIG 180 (H) 10/31/2023 1112   HDL 34 (L) 10/31/2023 1112   HDL 32 (L) 09/17/2017 1014   CHOLHDL 4.0 10/31/2023 1112   LDLCALC 74 10/31/2023 1112    PHYSICAL EXAM:    VS:  BP (!) 150/80 (BP Location: Left Arm, Patient Position: Sitting, Cuff Size: Normal) Comment: has not taken meds this am  Pulse 67   Ht 6' 3 (1.905 m)   Wt 252 lb 3.2 oz (114.4 kg)   SpO2 97%   BMI 31.52 kg/m   BMI: Body mass index is 31.52 kg/m.  Physical Exam Vitals reviewed.  Constitutional:      Appearance: He is well-developed.  HENT:     Head: Normocephalic and atraumatic.  Eyes:     General:        Right eye: No discharge.        Left eye: No discharge.  Cardiovascular:     Rate and Rhythm: Normal rate and regular rhythm.     Heart sounds: Normal heart sounds, S1 normal and S2 normal. Heart sounds not distant. No midsystolic click and no opening snap. No murmur heard.    No friction rub.  Pulmonary:     Effort: Pulmonary effort is normal. No respiratory distress.     Breath sounds: Normal breath sounds. No decreased breath sounds, wheezing, rhonchi or rales.   Musculoskeletal:     Cervical back: Normal range of motion.     Right lower leg: Edema present.     Left lower leg: Edema present.     Comments: Mild bilateral lower extremity pretibial edema with the left being slightly greater than the right.  Compression socks in place.  Lower extremity edema is significantly improved from prior office visits.  Skin:    General: Skin is warm and dry.     Nails: There is no clubbing.  Neurological:     Mental Status: He is alert and oriented to person, place, and time.  Psychiatric:        Speech: Speech normal.        Behavior: Behavior normal.        Thought Content: Thought content normal.  Judgment: Judgment normal.     Wt Readings from Last 3 Encounters:  12/10/23 252 lb 3.2 oz (114.4 kg)  11/29/23 255 lb 9.6 oz (115.9 kg)  10/31/23 257 lb (116.6 kg)     ASSESSMENT & PLAN:   History of syncope: No further episodes.  Etiology felt to be less likely arrhythmogenic per EP.  Reassuring echo, myocardial PET/CT, and Zio patch.  No indication for PPM at this time.  Venous insufficiency: Symptoms overall much improved.  Recent bilateral lower extremity ultrasound negative for DVT with imaging confirming bilateral venous reflux.  No compressive process noted on CT of the abdomen/pelvis.  Avoided calcium  channel blocker.  Remains on torsemide  20 mg, now on Mondays, Wednesdays, and Fridays as opposed to daily with AKI.  Continue leg elevation and compression socks.  Followed by vascular surgery.  HTN due to endocrine disorder: Is mildly elevated in the office today, though he has not taken medications yet this morning.  Unable to take amlodipine  due to progressive lower extremity swelling.  Unable to take spironolactone  due to prior hyperkalemia.  Remains on valsartan  80 mg daily along with Toprol -XL 50 mg and torsemide  20 mg, Mondays, Wednesdays, and Fridays.  Low-sodium diet encouraged.  Intermittent second degree AV block type I: No symptoms of  presyncope or syncope.  Ultimately may need to transition off Toprol -XL.  Aortic atherosclerosis/HLD: LDL 74 in 10/2023.  Remains on atorvastatin  40 mg.  Acute on CKD stage IIIa with history of hyperkalemia: Now on torsemide  Mondays, Wednesdays, and Fridays as opposed to daily.  Check BMP.  OSA: Intolerant to CPAP.     Disposition: F/u with Dr. Mady or an APP in 6 months.   Medication Adjustments/Labs and Tests Ordered: Current medicines are reviewed at length with the patient today.  Concerns regarding medicines are outlined above. Medication changes, Labs and Tests ordered today are summarized above and listed in the Patient Instructions accessible in Encounters.   Signed, Bernardino Bring, PA-C 12/10/2023 12:41 PM     [facility] [place] [address] [place], [place] [address] [phone]

## 2023-12-10 ENCOUNTER — Ambulatory Visit: Attending: Physician Assistant | Admitting: Physician Assistant

## 2023-12-10 ENCOUNTER — Encounter: Payer: Self-pay | Admitting: Physician Assistant

## 2023-12-10 VITALS — BP 150/80 | HR 67 | Ht 75.0 in | Wt 252.2 lb

## 2023-12-10 DIAGNOSIS — I700 Atherosclerosis of aorta: Secondary | ICD-10-CM

## 2023-12-10 DIAGNOSIS — I441 Atrioventricular block, second degree: Secondary | ICD-10-CM

## 2023-12-10 DIAGNOSIS — I152 Hypertension secondary to endocrine disorders: Secondary | ICD-10-CM | POA: Diagnosis not present

## 2023-12-10 DIAGNOSIS — I872 Venous insufficiency (chronic) (peripheral): Secondary | ICD-10-CM | POA: Diagnosis not present

## 2023-12-10 DIAGNOSIS — Z87898 Personal history of other specified conditions: Secondary | ICD-10-CM

## 2023-12-10 DIAGNOSIS — N179 Acute kidney failure, unspecified: Secondary | ICD-10-CM | POA: Diagnosis not present

## 2023-12-10 DIAGNOSIS — G4733 Obstructive sleep apnea (adult) (pediatric): Secondary | ICD-10-CM

## 2023-12-10 DIAGNOSIS — N1831 Chronic kidney disease, stage 3a: Secondary | ICD-10-CM

## 2023-12-10 DIAGNOSIS — E785 Hyperlipidemia, unspecified: Secondary | ICD-10-CM

## 2023-12-10 NOTE — Patient Instructions (Signed)
 Medication Instructions:  Your physician recommends that you continue on your current medications as directed. Please refer to the Current Medication list given to you today.   *If you need a refill on your cardiac medications before your next appointment, please call your pharmacy*  Lab Work: Your provider would like for you to have following labs drawn today BMeT.   If you have labs (blood work) drawn today and your tests are completely normal, you will receive your results only by: MyChart Message (if you have MyChart) OR A paper copy in the mail If you have any lab test that is abnormal or we need to change your treatment, we will call you to review the results.  Follow-Up: At [facility], you and your health needs are our priority.  As part of our continuing mission to provide you with exceptional heart care, our providers are all part of one team.  This team includes your primary Cardiologist (physician) and Advanced Practice Providers or APPs (Physician Assistants and Nurse Practitioners) who all work together to provide you with the care you need, when you need it.  Your next appointment:   6 month(s)  Provider:   You may see Lonni Hanson, MD or Bernardino Bring, PA-C

## 2023-12-11 ENCOUNTER — Ambulatory Visit: Payer: Self-pay | Admitting: Physician Assistant

## 2023-12-11 LAB — BASIC METABOLIC PANEL WITH GFR
BUN/Creatinine Ratio: 16 (ref 10–24)
BUN: 24 mg/dL (ref 8–27)
CO2: 24 mmol/L (ref 20–29)
Calcium: 9.4 mg/dL (ref 8.6–10.2)
Chloride: 108 mmol/L — ABNORMAL HIGH (ref 96–106)
Creatinine, Ser: 1.49 mg/dL — ABNORMAL HIGH (ref 0.76–1.27)
Glucose: 97 mg/dL (ref 70–99)
Potassium: 4.4 mmol/L (ref 3.5–5.2)
Sodium: 147 mmol/L — ABNORMAL HIGH (ref 134–144)
eGFR: 49 mL/min/1.73 — ABNORMAL LOW (ref >59)

## 2023-12-25 ENCOUNTER — Encounter: Payer: Self-pay | Admitting: Urology

## 2023-12-25 ENCOUNTER — Ambulatory Visit (INDEPENDENT_AMBULATORY_CARE_PROVIDER_SITE_OTHER): Admitting: Urology

## 2023-12-25 VITALS — BP 174/100 | HR 82 | Ht 72.0 in | Wt 248.0 lb

## 2023-12-25 DIAGNOSIS — E291 Testicular hypofunction: Secondary | ICD-10-CM

## 2023-12-25 NOTE — Progress Notes (Signed)
 12/25/2023 10:42 AM   Ozell MALVA Piety 22-Nov-1950 [id number]  Referring provider: Gareth Mliss FALCON, FNP [address] [place],  [place] [address]  Chief Complaint  Patient presents with   Hypogonadism   Urologic history:  1.  Nephrolithiasis 8 mm right renal pelvic calculus 08/2014 Right ureteroscopic removal attempted the scope unable to be advanced proximal to the mid ureter; patient stented Follow-up ureteroscopic stone removal 09/07/2014 after passive stent dilation  2.  BPH with LUTS UroLift 11/24/2019  3.  Phimosis Circumcision 01/17/2021   HPI: GIFFORD BALLON is a 73 y.o. male referred for hypogonadism   Total testosterone  level drawn 10/31/2023 was 166 ng/dL He was unsure why the testosterone  level was drawn but does complain of chronic tiredness and low energy No bothersome LUTS PSA 12/29/2022 0.6   PMH: Past Medical History:  Diagnosis Date   Adrenal hyperplasia    Anxiety    panic attacks   Arthritis    Basal cell carcinoma 2015   R upper lip - tx with MOHS by Dr. Bluford   Basal cell carcinoma 08/25/2021   BCC, nasal tip, Mohs completed on 09/30/21   CKD (chronic kidney disease), stage III (HCC)    Depression    Diastolic dysfunction    a.02/2018 Echo: LVEF 60-65%, mild LVH, G2DD, mild MR, mildly dilated LA, RV cavity size/wall thickness/systolic function normal; b. 01/2023 Echo: EF 60-65%, no rwma, GrI DD, nl RV fxn, no significant valvular disease.   GERD (gastroesophageal reflux disease)    Gout    History of kidney stones    History of stress test    a. 01/2023 Lexiscan  PET Stress: EF 68% without evidence of ischemia or infarct.  No significant coronary calcium .   Hyperaldosteronism    a.) secondary to BILATERAL adrenal hyperplasia   Hyperlipidemia    Hypertension    Nerve entrapment syndrome of lower extremity, right 10/24/2019   Pneumonia    2014 and 2018 ( 2018-klebsiella pneumonia    PSVT (paroxysmal supraventricular  tachycardia)    a. 02/2023 Zio: 9 SVT runs lasting up to 2 minutes and 51 seconds at a maximal rate of 148 bpm.   Sleep apnea    Syncope    a. 02/2023 Zio: Predominantly normal rhythm, average 77 (55-100).  Occasional PACs (1.1% burden).  9 SVT runs, longest 2 minutes and 51 seconds at a maximum rate of 148.  Intermittent Mobitz 1.  No prolonged pauses.  No triggered events.   T2DM (type 2 diabetes mellitus) (HCC)    Venous reflux    a. 08/2023 LE Venous duplex: No DVT.  Mild bilateral venous insufficiency in the greater saphenous veins in the calves.  Venous reflux noted in the left short saphenous vein.    Surgical History: Past Surgical History:  Procedure Laterality Date   ARTHROSCOPIC REPAIR ACL Left    CIRCUMCISION N/A 01/17/2021   Procedure: CIRCUMCISION ADULT;  Surgeon: Penne Knee, MD;  Location: [facility];  Service: Urology;  Laterality: N/A;   CYSTOSCOPY W/ URETERAL STENT PLACEMENT Right 09/07/2014   Procedure: CYSTOSCOPY WITH STENT REPLACEMENT;  Surgeon: Knee Penne, MD;  Location: [facility];  Service: Urology;  Laterality: Right;   CYSTOSCOPY WITH INSERTION OF UROLIFT N/A 11/24/2019   Procedure: CYSTOSCOPY WITH INSERTION OF UROLIFT;  Surgeon: Penne Knee, MD;  Location: [facility];  Service: Urology;  Laterality: N/A;   CYSTOSCOPY/URETEROSCOPY/HOLMIUM LASER/STENT PLACEMENT Right 08/17/2014   Procedure: CYSTOSCOPY/URETEROSCOPY//STENT PLACEMENT/ attempt of lithotripsy;  Surgeon: Knee Penne, MD;  Location: [facility];  Service: Urology;  Laterality: Right;   HERNIA REPAIR     JOINT REPLACEMENT     Bilat knees and right hip   MOHS SURGERY     lip   TOTAL HIP ARTHROPLASTY Right 06/16/2020   Procedure: TOTAL HIP ARTHROPLASTY ANTERIOR APPROACH;  Surgeon: Melodi Lerner, MD;  Location: [facility];  Service: Orthopedics;  Laterality: Right;    TOTAL KNEE ARTHROPLASTY Left 04/24/2016   Procedure: LEFT TOTAL KNEE ARTHROPLASTY;  Surgeon: Lerner Melodi, MD;  Location: [facility];   Service: Orthopedics;  Laterality: Left;  Adductor Block   TOTAL KNEE ARTHROPLASTY Right 10/23/2016   Procedure: RIGHT TOTAL KNEE ARTHROPLASTY;  Surgeon: Melodi Lerner, MD;  Location: [facility];  Service: Orthopedics;  Laterality: Right;   URETEROSCOPY WITH HOLMIUM LASER LITHOTRIPSY Right 09/07/2014   Procedure: URETEROSCOPY WITH HOLMIUM LASER LITHOTRIPSY;  Surgeon: Rosina Riis, MD;  Location: [facility];  Service: Urology;  Laterality: Right;   VASECTOMY      Home Medications:  Allergies as of 12/25/2023       Reactions   Bee Venom Other (See Comments)   Amlodipine     Ankle swelling   Celebrex [celecoxib]    GI issues   Tetanus Antitoxin Other (See Comments)   Tetanus Toxoid-containing Vaccines Other (See Comments)   Unkown   Tetanus-diphtheria Toxoids Td    Unknown        Medication List        Accurate as of December 25, 2023 10:42 AM. If you have any questions, ask your nurse or doctor.          aspirin  EC 81 MG tablet Take 81 mg by mouth daily. Swallow whole.   atorvastatin  40 MG tablet Commonly known as: LIPITOR Take 1 tablet (40 mg total) by mouth at bedtime.   Cholecalciferol 50 MCG (2000 UT) Tabs Take 1 tablet by mouth daily.   clotrimazole  1 % cream Commonly known as: LOTRIMIN  Apply 1 application topically 2 (two) times daily. Apply for 7-10 days.   dapagliflozin  propanediol 10 MG Tabs tablet Commonly known as: Farxiga  Take 1 tablet (10 mg total) by mouth daily before breakfast. Patient receives via [employer] Patient Assistance Program   fluticasone  50 MCG/ACT nasal spray Commonly known as: FLONASE  USE 2 SPRAYS IN EACH NOSTRIL DAILY   gabapentin  600 MG tablet Commonly known as: NEURONTIN  TAKE 1 TABLET THREE TIMES A DAY   glipiZIDE  10 MG tablet Commonly known as: GLUCOTROL  TAKE 1 TABLET DAILY BEFORE BREAKFAST   HYDROcodone -acetaminophen  10-325 MG tablet Commonly known as: NORCO Take 0.5-1 tablets by mouth every 8 (eight) hours as needed for  severe pain (pain score 7-10). Gradually reducing dose   HYDROcodone -acetaminophen  10-325 MG tablet Commonly known as: NORCO Take 0.5-1 tablets by mouth every 8 (eight) hours as needed for severe pain (pain score 7-10). Gradually reducing dose   HYDROcodone -acetaminophen  10-325 MG tablet Commonly known as: NORCO Take 0.5-1 tablets by mouth every 8 (eight) hours as needed for severe pain (pain score 7-10). Gradually reducing dose Start taking on: December 30, 2023   ketoconazole  2 % cream Commonly known as: NIZORAL  Apply to the feet QHS.   melatonin 3 MG Tabs tablet Take 3 mg by mouth at bedtime.   metFORMIN  1000 MG tablet Commonly known as: GLUCOPHAGE  TAKE 1 TABLET TWICE A DAY WITH MEALS   metoprolol  succinate 50 MG 24 hr tablet Commonly known as: TOPROL -XL Take 1 tablet (50 mg total) by mouth daily. Take with or immediately following  a meal.   multivitamin tablet Take 1 tablet by mouth daily.   NovoLOG  FlexPen 100 UNIT/ML FlexPen Generic drug: insulin  aspart per sliding scale three times daily with meals   Pre-meal insulin  : Check fsbs prior to meals Hold if below 110 If glucose 110- 140 give 4 units If glucose 140 to 180 give 6 units If glucose 180 to 220  give 8 units If glucose above 220 give 10 units   OZEMPIC (1 MG/DOSE) [place] Inject 1 mg into the skin once a week.   sildenafil  20 MG tablet Commonly known as: Revatio  Take 1 tablet (20 mg total) by mouth as needed. Take 1-5 tabs as needed prior to intercourse   tiZANidine  4 MG tablet Commonly known as: ZANAFLEX  TAKE 1 TABLET EVERY 6 HOURS AS NEEDED FOR MUSCLE SPASMS   torsemide  20 MG tablet Commonly known as: DEMADEX  1 tab daily with 1 additional tab every MWF   Tresiba  FlexTouch 100 UNIT/ML FlexTouch Pen Generic drug: insulin  degludec Inject 20 Units into the skin daily. Total daily dose = 0.2-0.5 units/kg/day---> 50% Basal/50% Bolus   True Metrix Blood Glucose Test test strip Generic drug: glucose blood Test   blood sugar twice a day   valsartan  80 MG tablet Commonly known as: Diovan  Take 1 tablet (80 mg total) by mouth daily.        Allergies:  Allergies  Allergen Reactions   Bee Venom Other (See Comments)   Amlodipine      Ankle swelling   Celebrex [Celecoxib]     GI issues   Tetanus Antitoxin Other (See Comments)   Tetanus Toxoid-Containing Vaccines Other (See Comments)    Unkown   Tetanus-Diphtheria Toxoids Td     Unknown    Family History: Family History  Problem Relation Age of Onset   Hypertension Mother    Cancer Mother    Hyperlipidemia Mother    Arthritis Mother    Stroke Father    Heart disease Father    Obesity Father    Depression Sister    Hypertension Sister    Hyperlipidemia Sister    Learning disabilities Sister    Obesity Sister    Kidney disease Maternal Grandmother     Social History:  reports that he has never smoked. He has never used smokeless tobacco. He reports that he does not drink alcohol  and does not use drugs.   Physical Exam: BP (!) 174/100   Pulse 82   Ht 6' (1.829 m)   Wt 248 lb (112.5 kg)   BMI 33.63 kg/m   Constitutional:  Alert, No acute distress. HEENT: [place] AT Respiratory: Normal respiratory effort, no increased work of breathing. Psychiatric: Normal mood and affect.    Assessment & Plan:    1.  Hypogonadism We discussed the diagnosis of testosterone  is based on 2 abnormal total testosterone  levels drawn in the a.m. admitted with signs and symptoms of low testosterone . He will be scheduled for an a.m. blood draw for repeat testosterone  and LH We discussed various forms of testosterone  placement including topical preparations, intramuscular injections, subcutaneous injections, subcutaneous pellet implantation and oral testosterone .  Pros and cons of each form were discussed.  The risk of transference of topical testosterone . I had an extensive discussion regarding testosterone  replacement therapy including the following:  Treatment may result in improvements in erectile function, low sex drive, anemia, bone mineral density, lean body mass, and depressive symptoms; evidence is inconclusive whether testosterone  therapy improves cognitive function, measures of diabetes, energy, fatigue, lipid  profiles, and quality of life measures; there is no conclusive evidence linking testosterone  therapy to the development of prostate cancer; there is no definitive evidence linking testosterone  therapy to a higher incidence of venothrombolic events; at the present time it cannot be stated definitively whether testosterone  therapy increases or decreases the risk of cardiovascular events including myocardial infarction and stroke. Potential side effects were discussed including erythrocytosis, gynecomastia.  The need for regular monitoring of testosterone  levels and hematocrit was discussed. He will think over his replacement options   Glendia JAYSON Barba, MD  [facility] [address] [place], [place] [address] [phone]

## 2023-12-26 ENCOUNTER — Other Ambulatory Visit

## 2023-12-26 DIAGNOSIS — E291 Testicular hypofunction: Secondary | ICD-10-CM

## 2023-12-27 LAB — HEMOGLOBIN AND HEMATOCRIT, BLOOD
Hematocrit: 44.8 % (ref 37.5–51.0)
Hemoglobin: 14.4 g/dL (ref 13.0–17.7)

## 2023-12-27 LAB — TESTOSTERONE: Testosterone: 216 ng/dL — ABNORMAL LOW (ref 264–916)

## 2023-12-28 NOTE — Progress Notes (Signed)
 JENTRY WARNELL                                          MRN: [id number]   12/28/2023   The VBCI Quality Team Specialist reviewed this patient medical record for the purposes of chart review for care gap closure. The following were reviewed: chart review for care gap closure-diabetic eye exam.    VBCI Quality Team

## 2023-12-29 ENCOUNTER — Ambulatory Visit: Payer: Self-pay | Admitting: Urology

## 2023-12-31 ENCOUNTER — Other Ambulatory Visit: Payer: Self-pay | Admitting: Physician Assistant

## 2024-01-01 ENCOUNTER — Other Ambulatory Visit: Payer: Self-pay | Admitting: Pharmacist

## 2024-01-01 ENCOUNTER — Encounter: Payer: Self-pay | Admitting: Pharmacist

## 2024-01-01 DIAGNOSIS — E1165 Type 2 diabetes mellitus with hyperglycemia: Secondary | ICD-10-CM

## 2024-01-01 LAB — SPECIMEN STATUS REPORT

## 2024-01-01 LAB — LUTEINIZING HORMONE: LH: 9 m[IU]/mL — AB (ref 1.7–8.6)

## 2024-01-01 NOTE — Patient Instructions (Signed)
 [employer], the maker of Ozempic  and Rybelsus , has announced that they will no longer offer these medications to Medicare beneficiaries through their Patient Assistance Program in 2026.    This means that if you currently receive Ozempic  or Rybelsus  at no cost through the program, starting in January 2026 you'll need to use your insurance to continue on these medicines.      Choosing a Medicare Plan   With Medicare's Annual Enrollment Period starting on October 15th, we encourage you to review formulary coverage and copay amounts for Ozempic  or Rybelsus , as costs can vary widely between plans. Starting on Wednesday, October 1st you can compare your Medicare plan options by going to the Plan Finder tool at [employer].gov ( TeleconferenceOnDemand.fr ).    There are Medicare Specialists through the [place] [facility] Insurance Information Program (Chance Freer) that can help you shop for Medicare plans.    [place] [facility] toll free number: [phone] (Monday - Friday 8 am - 5 pm)   There are representatives located in each county:    [facility] [address]     [place]  [address] [phone] Call for an appointment with [facility]   Maximum Out-of-Pocket and Prescription Payment Plan   In 2026, the maximum yearly out-of-pocket cost for medications is $2,100 for all Medicare beneficiaries. This means you will not have to pay more than $2,100 in total for all prescription medications filled on your Medicare plan in 2026. You may also choose to enroll in a Medicare Prescription Payment Plan through your chosen 2026 Medicare plan. This lets you spread out your prescription drug costs over the year instead of laying a large amount all at once at the pharmacy.    Sharyle Sia, PharmD, [facility] [phone]

## 2024-01-01 NOTE — Progress Notes (Signed)
   01/01/2024  Patient ID: Ozell MALVA Piety, male   DOB: 1950/07/27, 73 y.o.   MRN: [id number]  Patient enrolled in Ozempic patient assistance program from [employer] through 04/02/2024  [employer] has announced that they will no longer offer Ozempic to [employer] beneficiaries through their Patient Assistance Program in 2026.   Outreach to patient today and provide this update.   Based on reported income, patient does not meet criteria for Extra Help subsidy  Counsel patient that Medicare's Annual Enrollment Period for 2026 starts 01/16/2024. Encourage patient to review deductibles formulary coverage and copay amounts (including for Ozempic) between plans. Also share with patient the contact information for the [facility] Information Program [facility]) that can help with reviewing these Medicare plans Will also share this information with patient via MyChart message  Follow Up Plan: Schedule follow up for patient to speak with Clinical Pharmacist Peyton Ferries by telephone on 01/21/2024 at 2:30 PM      Sharyle Sia, PharmD, [facility] [phone]

## 2024-01-02 DIAGNOSIS — E119 Type 2 diabetes mellitus without complications: Secondary | ICD-10-CM | POA: Diagnosis not present

## 2024-01-07 ENCOUNTER — Other Ambulatory Visit: Payer: Self-pay | Admitting: Nurse Practitioner

## 2024-01-07 DIAGNOSIS — E669 Obesity, unspecified: Secondary | ICD-10-CM

## 2024-01-08 NOTE — Telephone Encounter (Signed)
 Requested Prescriptions  Pending Prescriptions Disp Refills   metFORMIN  (GLUCOPHAGE ) 1000 MG tablet [Pharmacy Med Name: METFORMIN  HCL TABS 1000MG ] 180 tablet 1    Sig: TAKE 1 TABLET TWICE A DAY WITH MEALS     Endocrinology:  Diabetes - Biguanides Failed - 01/08/2024 12:39 PM      Failed - Cr in normal range and within 360 days    Creat  Date Value Ref Range Status  10/31/2023 1.60 (H) 0.70 - 1.28 mg/dL Final   Creatinine, Ser  Date Value Ref Range Status  12/10/2023 1.49 (H) 0.76 - 1.27 mg/dL Final   Creatinine, Urine  Date Value Ref Range Status  02/20/2023 101 20 - 320 mg/dL Final         Failed - eGFR in normal range and within 360 days    GFR, Est African American  Date Value Ref Range Status  03/16/2020 51 (L) > OR = 60 mL/min/1.63m2 Final   GFR, Est Non African American  Date Value Ref Range Status  03/16/2020 44 (L) > OR = 60 mL/min/1.31m2 Final   GFR, Estimated  Date Value Ref Range Status  01/04/2023 41 (L) >60 mL/min Final    Comment:    (NOTE) Calculated using the CKD-EPI Creatinine Equation (2021)    eGFR  Date Value Ref Range Status  12/10/2023 49 (L) >59 mL/min/1.73 Final         Failed - B12 Level in normal range and within 720 days    No results found for: VITAMINB12       Passed - HBA1C is between 0 and 7.9 and within 180 days    HbA1c, POC (prediabetic range)  Date Value Ref Range Status  03/25/2018 8.9 (A) 5.7 - 6.4 % Final   HbA1c, POC (controlled diabetic range)  Date Value Ref Range Status  03/25/2018 8.9 (A) 0.0 - 7.0 % Final   HbA1c POC (<> result, manual entry)  Date Value Ref Range Status  03/25/2018 8.9 4.0 - 5.6 % Final   Hgb A1c MFr Bld  Date Value Ref Range Status  10/31/2023 7.0 (H) <5.7 % Final    Comment:    For someone without known diabetes, a hemoglobin A1c value of 6.5% or greater indicates that they may have  diabetes and this should be confirmed with a follow-up  test. . For someone with known diabetes, a  value <7% indicates  that their diabetes is well controlled and a value  greater than or equal to 7% indicates suboptimal  control. A1c targets should be individualized based on  duration of diabetes, age, comorbid conditions, and  other considerations. . Currently, no consensus exists regarding use of hemoglobin A1c for diagnosis of diabetes for children. SABRA Amy - Valid encounter within last 6 months    Recent Outpatient Visits           2 months ago Essential hypertension   [facility] Gareth Mliss FALCON, FNP   5 months ago Aortic atherosclerosis   [facility] Gareth Mliss FALCON, FNP       Future Appointments             In 3 weeks Gareth, Mliss FALCON, FNP [facility], [place]   In 7 months Hester Alm BROCKS, MD [facility]            Passed - CBC within normal limits and  completed in the last 12 months    WBC  Date Value Ref Range Status  10/31/2023 6.2 3.8 - 10.8 Thousand/uL Final   RBC  Date Value Ref Range Status  10/31/2023 4.65 4.20 - 5.80 Million/uL Final   Hemoglobin  Date Value Ref Range Status  12/26/2023 14.4 13.0 - 17.7 g/dL Final   Hematocrit  Date Value Ref Range Status  12/26/2023 44.8 37.5 - 51.0 % Final   MCHC  Date Value Ref Range Status  10/31/2023 32.1 32.0 - 36.0 g/dL Final    Comment:    For adults, a slight decrease in the calculated MCHC value (in the range of 30 to 32 g/dL) is most likely not clinically significant; however, it should be interpreted with caution in correlation with other red cell parameters and the patient's clinical condition.    [facility]  Date Value Ref Range Status  10/31/2023 31.6 27.0 - 33.0 pg Final   MCV  Date Value Ref Range Status  10/31/2023 98.5 80.0 - 100.0 fL Final  01/03/2023 100 (H) 79 - 97 fL Final   No results found for: PLTCOUNTKUC, LABPLAT, POCPLA RDW  Date Value Ref Range  Status  10/31/2023 13.4 11.0 - 15.0 % Final  01/03/2023 12.4 11.6 - 15.4 % Final

## 2024-01-11 ENCOUNTER — Other Ambulatory Visit: Payer: Self-pay | Admitting: Nurse Practitioner

## 2024-01-11 DIAGNOSIS — E1169 Type 2 diabetes mellitus with other specified complication: Secondary | ICD-10-CM

## 2024-01-11 DIAGNOSIS — I700 Atherosclerosis of aorta: Secondary | ICD-10-CM

## 2024-01-14 NOTE — Telephone Encounter (Signed)
 Requested Prescriptions  Pending Prescriptions Disp Refills   atorvastatin  (LIPITOR) 40 MG tablet [Pharmacy Med Name: ATORVASTATIN  TABS 40MG ] 90 tablet 1    Sig: TAKE 1 TABLET AT BEDTIME     Cardiovascular:  Antilipid - Statins Failed - 01/14/2024  4:47 PM      Failed - Lipid Panel in normal range within the last 12 months    Cholesterol, Total  Date Value Ref Range Status  09/17/2017 140 100 - 199 mg/dL Final   Cholesterol  Date Value Ref Range Status  10/31/2023 136 <200 mg/dL Final   LDL Cholesterol (Calc)  Date Value Ref Range Status  10/31/2023 74 mg/dL (calc) Final    Comment:    Reference range: <100 . Desirable range <100 mg/dL for primary prevention;   <70 mg/dL for patients with CHD or diabetic patients  with > or = 2 CHD risk factors. SABRA LDL-C is now calculated using the Martin-Hopkins  calculation, which is a validated novel method providing  better accuracy than the Friedewald equation in the  estimation of LDL-C.  Gladis APPLETHWAITE et al. SANDREA. 7986;689(80): 2061-2068  (http://education.Quest[employer].com/faq/FAQ164)    HDL  Date Value Ref Range Status  10/31/2023 34 (L) > OR = 40 mg/dL Final  93/82/7980 32 (L) >39 mg/dL Final   Triglycerides  Date Value Ref Range Status  10/31/2023 180 (H) <150 mg/dL Final         Passed - Patient is not pregnant      Passed - Valid encounter within last 12 months    Recent Outpatient Visits           2 months ago Essential hypertension   [facility] Gareth Mliss FALCON, FNP   5 months ago Aortic atherosclerosis   [facility] Gareth Mliss FALCON, FNP       Future Appointments             In 2 weeks Gareth, Mliss FALCON, FNP [facility], [place]   In 7 months Hester Alm BROCKS, MD [facility]

## 2024-01-17 DIAGNOSIS — E113393 Type 2 diabetes mellitus with moderate nonproliferative diabetic retinopathy without macular edema, bilateral: Secondary | ICD-10-CM | POA: Diagnosis not present

## 2024-01-17 DIAGNOSIS — H524 Presbyopia: Secondary | ICD-10-CM | POA: Diagnosis not present

## 2024-01-17 LAB — HM DIABETES EYE EXAM

## 2024-01-21 ENCOUNTER — Other Ambulatory Visit

## 2024-01-21 ENCOUNTER — Telehealth: Payer: Self-pay

## 2024-01-21 NOTE — Progress Notes (Signed)
 Complex Care Management Note Care Guide Note  01/21/2024 Name: Evan Rivas MRN: [id number] DOB: 10-31-1950   Complex Care Management Outreach Attempts: An unsuccessful telephone outreach was attempted today to offer the patient information about available complex care management services.  Follow Up Plan:  Additional outreach attempts will be made to offer the patient complex care management information and services.   Encounter Outcome:  No Answer  D[facility]a [facility]  [facility], [facility] VBCI Assistant Direct Dial: [phone]  Fax: [phone]

## 2024-01-21 NOTE — Progress Notes (Signed)
 Complex Care Management Care Guide Note  01/21/2024 Name: Evan Rivas MRN: [id number] DOB: 12-10-1950  Evan Rivas is a 73 y.o. year old male who is a primary care patient of Gareth Mliss FALCON, FNP and is actively engaged with the care management team. I reached out to Ozell MALVA Piety by phone today to assist with re-scheduling  with the Pharmacist.  Follow up plan: Telephone appointment with complex care management team member scheduled for:  02/05/24 at 11:00 a.m.   [facility]a [facility]  [facility], [facility] VBCI Assistant Direct Dial: [phone]  Fax: [phone]

## 2024-01-23 ENCOUNTER — Other Ambulatory Visit: Admitting: Pharmacist

## 2024-01-31 ENCOUNTER — Encounter: Payer: Self-pay | Admitting: Nurse Practitioner

## 2024-01-31 ENCOUNTER — Ambulatory Visit: Admitting: Nurse Practitioner

## 2024-01-31 VITALS — BP 158/108 | HR 80 | Temp 98.2°F | Ht 72.0 in | Wt 248.0 lb

## 2024-01-31 DIAGNOSIS — F1120 Opioid dependence, uncomplicated: Secondary | ICD-10-CM | POA: Diagnosis not present

## 2024-01-31 DIAGNOSIS — I5032 Chronic diastolic (congestive) heart failure: Secondary | ICD-10-CM

## 2024-01-31 DIAGNOSIS — E1122 Type 2 diabetes mellitus with diabetic chronic kidney disease: Secondary | ICD-10-CM

## 2024-01-31 DIAGNOSIS — E1169 Type 2 diabetes mellitus with other specified complication: Secondary | ICD-10-CM

## 2024-01-31 DIAGNOSIS — E119 Type 2 diabetes mellitus without complications: Secondary | ICD-10-CM

## 2024-01-31 DIAGNOSIS — M1611 Unilateral primary osteoarthritis, right hip: Secondary | ICD-10-CM

## 2024-01-31 DIAGNOSIS — I130 Hypertensive heart and chronic kidney disease with heart failure and stage 1 through stage 4 chronic kidney disease, or unspecified chronic kidney disease: Secondary | ICD-10-CM

## 2024-01-31 DIAGNOSIS — M5136 Other intervertebral disc degeneration, lumbar region with discogenic back pain only: Secondary | ICD-10-CM | POA: Diagnosis not present

## 2024-01-31 DIAGNOSIS — N400 Enlarged prostate without lower urinary tract symptoms: Secondary | ICD-10-CM

## 2024-01-31 DIAGNOSIS — Z23 Encounter for immunization: Secondary | ICD-10-CM | POA: Diagnosis not present

## 2024-01-31 DIAGNOSIS — M545 Low back pain, unspecified: Secondary | ICD-10-CM

## 2024-01-31 DIAGNOSIS — N1831 Chronic kidney disease, stage 3a: Secondary | ICD-10-CM

## 2024-01-31 DIAGNOSIS — F3342 Major depressive disorder, recurrent, in full remission: Secondary | ICD-10-CM

## 2024-01-31 DIAGNOSIS — G4733 Obstructive sleep apnea (adult) (pediatric): Secondary | ICD-10-CM

## 2024-01-31 DIAGNOSIS — I10 Essential (primary) hypertension: Secondary | ICD-10-CM

## 2024-01-31 LAB — POCT GLYCOSYLATED HEMOGLOBIN (HGB A1C): Hemoglobin A1C: 6.3 % — AB (ref 4.0–5.6)

## 2024-01-31 MED ORDER — HYDROCODONE-ACETAMINOPHEN 10-325 MG PO TABS: 0.5000 | ORAL_TABLET | Freq: Three times a day (TID) | ORAL | 0 refills | Status: DC | PRN

## 2024-01-31 NOTE — Progress Notes (Signed)
 BP (!) 158/108   Pulse 80   Temp 98.2 F (36.8 C)   Ht 6' (1.829 m)   Wt 248 lb (112.5 kg)   SpO2 95%   BMI 33.63 kg/m    Subjective:    Patient ID: Evan Rivas, male    DOB: 10/23/1950, 73 y.o.   MRN: [id number]  HPI: Evan Rivas is a 73 y.o. male  Chief Complaint  Patient presents with   Medical Management of Chronic Issues   Discussed the use of AI scribe software for clinical note transcription with the patient, who gave verbal consent to proceed.  History of Present Illness Evan Rivas is a 73 year old male with chronic low back pain and degenerative disc disease who presents for a chronic pain medication refill.  Chronic low back pain and degenerative disc disease - Chronic low back pain secondary to degenerative disc disease and osteoarthritis - Opioid dependence related to chronic pain management - Current pain regimen: Norco 10-325 mg, half to one tablet every eight hours as needed - Discontinued gabapentin  after tapering due to concerns about dementia-like symptoms and swelling; previously reduced to one pill daily before cessation - Uses tizanidine  4 mg every six hours as needed for muscle spasms  Edema and fluid retention - Leg swelling and fluid retention managed with compression socks and elevating feet - Torsemide  20 mg daily, with an additional tablet on Monday, Wednesday, and Friday; frequency reduced due to impact on daily activities  Nocturia and sleep disruption - Frequent nighttime urination disrupting sleep - History of benign prostatic hyperplasia - Uses CPAP machine nightly for sleep apnea  Obesity and weight management - Current weight 248 pounds, BMI 33.63 - Previously close to dropping below 243 pounds before recent disruptions - Goal weight 225 pounds - Mindful of diet, limiting portion sizes and snacks - Recent stress and family events have disrupted weight management routine  Hypertension and cardiovascular  disease/heart failure - Chronic hypertension; blood pressure today 148/104, 160/103 at home this morning - Chronic heart failure - Current medications include metoprolol  50 mg daily, valsartan  80 mg daily, aspirin  81 mg daily, atorvastatin  40 mg daily, torsemide  20 mg daily  Type 2 diabetes mellitus and hyperlipidemia - Type 2 diabetes managed with Farxiga  10 mg daily, glipizide  10 mg daily, Novolog  on sliding scale, Tresiba  20 units daily, metformin  1000 mg twice daily, Ozempic 1 mg weekly - Hyperlipidemia managed with atorvastatin  40 mg daily -up to date on eye exam will bring records  Chronic kidney disease - Chronic kidney disease; managed with current medication regimen  Mood disturbance and psychosocial stressors - History of depression - Recent increased stress attributed to wife's health scare and family visits, disrupting routine  Ophthalmologic symptoms - Cataracts scheduled for removal on January 6th - No glaucoma or macular degeneration         01/31/2024   10:52 AM 10/31/2023   10:50 AM 07/27/2023   10:27 AM  Depression screen PHQ 2/9  Decreased Interest 0 0 0  Down, Depressed, Hopeless 0 0 0  PHQ - 2 Score 0 0 0  Altered sleeping 0 0 1  Tired, decreased energy 0 0 0  Change in appetite 0 0 0  Feeling bad or failure about yourself  0 0 0  Trouble concentrating 0 0 0  Moving slowly or fidgety/restless 0 0 0  Suicidal thoughts 0 0 0  PHQ-9 Score 0 0 1  Difficult doing work/chores Not difficult at all  Not difficult at all    Relevant past medical, surgical, family and social history reviewed and updated as indicated. Interim medical history since our last visit reviewed. Allergies and medications reviewed and updated.  Review of Systems  Ten systems reviewed and is negative except as mentioned in HPI      Objective:      BP (!) 158/108   Pulse 80   Temp 98.2 F (36.8 C)   Ht 6' (1.829 m)   Wt 248 lb (112.5 kg)   SpO2 95%   BMI 33.63 kg/m    Wt  Readings from Last 3 Encounters:  01/31/24 248 lb (112.5 kg)  12/25/23 248 lb (112.5 kg)  12/10/23 252 lb 3.2 oz (114.4 kg)    Physical Exam VITALS: BP- 148/104 MEASUREMENTS: Weight- 248, BMI- 33.63. GENERAL: Alert, cooperative, well developed, no acute distress HEENT: Normocephalic, normal oropharynx, moist mucous membranes CHEST: Clear to auscultation bilaterally, no wheezes, rhonchi, or crackles CARDIOVASCULAR: Normal heart rate and rhythm, S1 and S2 normal without murmurs ABDOMEN: Soft, non-tender, non-distended, without organomegaly, normal bowel sounds EXTREMITIES: No cyanosis or edema NEUROLOGICAL: Cranial nerves grossly intact, moves all extremities without gross motor or sensory deficit  Results for orders placed or performed in visit on 01/31/24  POCT HgB A1C   Collection Time: 01/31/24 11:27 AM  Result Value Ref Range   Hemoglobin A1C 6.3 (A) 4.0 - 5.6 %   HbA1c POC (<> result, manual entry)     HbA1c, POC (prediabetic range)     HbA1c, POC (controlled diabetic range)            Assessment & Plan:   Problem List Items Addressed This Visit       Cardiovascular and Mediastinum   Essential hypertension   Chronic diastolic heart failure (HCC)     Respiratory   OSA (obstructive sleep apnea)     Endocrine   Hyperlipidemia associated with type 2 diabetes mellitus (HCC)   Type 2 diabetes mellitus without complication, without long-term current use of insulin  (HCC)   Relevant Orders   POCT HgB A1C (Completed)     Musculoskeletal and Integument   DDD (degenerative disc disease), lumbar - Primary   Relevant Medications   HYDROcodone -acetaminophen  (NORCO) 10-325 MG tablet   HYDROcodone -acetaminophen  (NORCO) 10-325 MG tablet (Start on 03/01/2024)   HYDROcodone -acetaminophen  (NORCO) 10-325 MG tablet (Start on 03/31/2024)   Osteoarthritis of right hip   Relevant Medications   HYDROcodone -acetaminophen  (NORCO) 10-325 MG tablet   HYDROcodone -acetaminophen  (NORCO)  10-325 MG tablet (Start on 03/01/2024)   HYDROcodone -acetaminophen  (NORCO) 10-325 MG tablet (Start on 03/31/2024)     Genitourinary   BPH without urinary obstruction   Stage 3a chronic kidney disease (HCC)     Other   Continuous opioid dependence (HCC)   Relevant Medications   HYDROcodone -acetaminophen  (NORCO) 10-325 MG tablet   HYDROcodone -acetaminophen  (NORCO) 10-325 MG tablet (Start on 03/01/2024)   HYDROcodone -acetaminophen  (NORCO) 10-325 MG tablet (Start on 03/31/2024)   Morbid obesity (HCC)   Chronic low back pain   Relevant Medications   HYDROcodone -acetaminophen  (NORCO) 10-325 MG tablet   HYDROcodone -acetaminophen  (NORCO) 10-325 MG tablet (Start on 03/01/2024)   HYDROcodone -acetaminophen  (NORCO) 10-325 MG tablet (Start on 03/31/2024)   RESOLVED: Major depressive disorder, recurrent episode, moderate (HCC)   Other Visit Diagnoses       Immunization due       Relevant Orders   Flu vaccine HIGH DOSE PF(Fluzone Trivalent) (Completed)        Assessment and Plan Assessment &  Plan Chronic low back pain with lumbar degenerative disc disease and opioid dependence Chronic low back pain secondary to lumbar degenerative disc disease. Opioid dependence managed with Norco as needed for pain. He has successfully weaned off gabapentin  due to concerns about dementia-like symptoms and swelling. PDMP reviewed - Continue Norco 10/325 mg, half to one tablet every 8 hours as needed for pain  Chronic diastolic heart failure/htn Chronic diastolic heart failure. Reports increased stress due to partner's health issues, potentially affecting htn management. BP Readings from Last 3 Encounters:  01/31/24 (!) 158/108  12/25/23 (!) 174/100  12/10/23 (!) 150/80   He will discuss with cardiology regarding increasing his medications. He does not want to make any changes at this time.   Chronic kidney disease, stage 3a Chronic kidney disease stage 3a. Reports nocturia and fluid retention, managed  with torsemide . Adjusted torsemide  intake to once a week due to side effects. - Continue torsemide  20 mg, one tablet daily and one additional tab every Monday, Wednesday, and Friday  Type 2 diabetes mellitus Type 2 diabetes mellitus, well-controlled with recent A1c of 7.0%. Managed with multiple medications including metformin , glipizide , and Ozempic. Reports lifestyle changes contributing to weight loss and improved glycemic control. - Continue metformin  1000 mg twice daily - Continue glipizide  10 mg daily - Continue Ozempic 1 mg weekly  Essential hypertension Essential hypertension, currently elevated at 148/104 mmHg. Reports stress-related elevation in blood pressure due to partner's health issues. Currently on metoprolol  50 mg daily, considering increase to 80 mg. - Continue metoprolol  50 mg daily - Will consider increasing metoprolol  to 80 mg daily  Morbid obesity due to excess calories Morbid obesity with recent weight loss efforts. Reports significant lifestyle changes and dietary modifications leading to weight reduction. Currently at 248 pounds with a BMI of 33.63. - Continue current lifestyle modifications and dietary changes  Obstructive sleep apnea Managed with CPAP machine. - Continue CPAP therapy  Depression in full remission Depression in full remission.  Cataracts, planned for surgery Cataracts with planned surgery on January 6th. Reports no glaucoma or macular degeneration on recent eye exam. - Proceed with cataract surgery on January 6th  Hyperlipidemia -continue atorvastatin  40 mg daily Lipid Panel     Component Value Date/Time   CHOL 136 10/31/2023 1112   CHOL 140 09/17/2017 1014   TRIG 180 (H) 10/31/2023 1112   HDL 34 (L) 10/31/2023 1112   HDL 32 (L) 09/17/2017 1014   CHOLHDL 4.0 10/31/2023 1112   LDLCALC 74 10/31/2023 1112   LABVLDL 28 09/17/2017 1014           Follow up plan: Return in about 3 months (around 05/02/2024) for follow up.

## 2024-02-02 DIAGNOSIS — E119 Type 2 diabetes mellitus without complications: Secondary | ICD-10-CM | POA: Diagnosis not present

## 2024-02-05 ENCOUNTER — Ambulatory Visit

## 2024-02-05 VITALS — BP 226/94 | HR 76

## 2024-02-05 DIAGNOSIS — I10 Essential (primary) hypertension: Secondary | ICD-10-CM

## 2024-02-05 DIAGNOSIS — E119 Type 2 diabetes mellitus without complications: Secondary | ICD-10-CM

## 2024-02-05 MED ORDER — VALSARTAN 160 MG PO TABS: 160.0000 mg | ORAL_TABLET | Freq: Every day | ORAL | 1 refills | Status: DC

## 2024-02-05 MED ORDER — INSULIN LISPRO (1 UNIT DIAL) 100 UNIT/ML (KWIKPEN): PEN_INJECTOR | SUBCUTANEOUS | Status: AC

## 2024-02-05 MED ORDER — CHLORTHALIDONE 25 MG PO TABS: 25.0000 mg | ORAL_TABLET | Freq: Every day | ORAL | 1 refills | Status: AC

## 2024-02-05 MED ORDER — BASAGLAR KWIKPEN 100 UNIT/ML ~~LOC~~ SOPN: 20.0000 [IU] | PEN_INJECTOR | Freq: Every day | SUBCUTANEOUS | Status: AC

## 2024-02-05 NOTE — Progress Notes (Signed)
 S:     Reason for visit: ?  Evan Rivas is a 73 y.o. male with a history of diabetes (type 2), who presents today for an initial diabetes Telephone pharmacotherapy visit.? Pertinent PMH also includes gout, OSA, HLD, cancer of the lip, hyperaldosteronism, diastolic dysfunction, second degree AV block.  Care Team: Primary Care Provider: Gareth Mliss FALCON, FNP  At last visit with PCP on 01/31/24, A1c was improved at 6.3%. BP was elevated in clinic at 158/108 mmHg. Patient declined any medication changes at that time.  Current diabetes medications include: Farxiga  10 mg daily, Ozempic 1 mg weekly, Novolog  sliding scale for breakfast, Tresiba  20 units daily, metformin  1000 mg BID, glipizide  10 mg daily  Previous diabetes medications include: Trulicity  Current hyperlipidemia medications include: atorvastatin  40 mg daily  Patient reports adherence to taking all medications as prescribed. He uses pill box for   Have you been experiencing any side effects to the medications prescribed? no Do you have any problems obtaining medications due to transportation or finances? no Insurance coverage: BCBS Medicare  Current medication access support:  Farxiga  via [employer] Ozempic, Tresiba , and Novolog  via [employer]  Patient denies hypoglycemic events.  Reported home fasting blood sugars: 95-170 mg/dL  *patient declined CGM  Patient reported dietary habits: Reports recent dietary indiscretions since his brother was visiting Drinks: 4 sodas a day  Hypertension/HFpEF: Current medications: Toprol  XL 50 mg daily, torsemide  20 mg daily with 1 additional on Mon/Wed/Fri, valsartan  80 mg daily  Medications previously tried: amlodipine  (swelling), spironolactone  (hyperkalemia), hydrochlorothiazide  (hypotension)  Patient has a validated, automated, upper arm home BP cuff Current blood pressure readings readings: 140-190/77-112  Patient denies hypotensive s/sx including dizziness, lightheadedness.   Patient denies hypertensive symptoms including vision changes, palpitations, chest pain, shortness of breath  Patient reports taking his BP medications this morning. DM Prevention:  Statin: Taking; high intensity.?  ACE/ARB: yes; valsartan  History of chronic kidney disease? yes Last urinary albumin/creatinine ratio:  Lab Results  Component Value Date   MICRALBCREAT 517 (H) 02/20/2023   MICRALBCREAT 5 02/02/2022   MICRALBCREAT 3 03/16/2020   MICRALBCREAT 66 (H) 04/07/2019   MICRALBCREAT 11 07/30/2018   MICRALBCREAT 69 (H) 12/18/2017   MICRALBCREAT 31.3 (H) 09/03/2015   Last eye exam:     Last foot exam: 02/02/2022 Tobacco Use:  Tobacco Use: Low Risk  (01/31/2024)   Patient History    Smoking Tobacco Use: Never    Smokeless Tobacco Use: Never    Passive Exposure: Not on file   O:  Vitals:  Wt Readings from Last 3 Encounters:  01/31/24 248 lb (112.5 kg)  12/25/23 248 lb (112.5 kg)  12/10/23 252 lb 3.2 oz (114.4 kg)   BP Readings from Last 3 Encounters:  02/05/24 (!) 226/94  01/31/24 (!) 158/108  12/25/23 (!) 174/100   Pulse Readings from Last 3 Encounters:  02/05/24 76  01/31/24 80  12/25/23 82     Labs:?  Lab Results  Component Value Date   HGBA1C 6.3 (A) 01/31/2024   HGBA1C 7.0 (H) 10/31/2023   HGBA1C 8.1 (A) 08/01/2023   GLUCOSE 97 12/10/2023   MICRALBCREAT 517 (H) 02/20/2023   MICRALBCREAT 5 02/02/2022   MICRALBCREAT 3 03/16/2020   CREATININE 1.49 (H) 12/10/2023   CREATININE 1.60 (H) 10/31/2023   CREATININE 1.46 (H) 08/31/2023    Lab Results  Component Value Date   CHOL 136 10/31/2023   LDLCALC 74 10/31/2023   LDLCALC 70 07/28/2022   LDLCALC 80 02/02/2022  HDL 34 (L) 10/31/2023   TRIG 180 (H) 10/31/2023   TRIG 232 (H) 07/28/2022   TRIG 211 (H) 02/02/2022   ALT 21 10/31/2023   ALT 29 01/04/2023   AST 18 10/31/2023   AST 19 01/04/2023      Chemistry      Component Value Date/Time   NA 147 (H) 12/10/2023 1143   K 4.4 12/10/2023 1143    CL 108 (H) 12/10/2023 1143   CO2 24 12/10/2023 1143   BUN 24 12/10/2023 1143   CREATININE 1.49 (H) 12/10/2023 1143   CREATININE 1.60 (H) 10/31/2023 1112      Component Value Date/Time   CALCIUM  9.4 12/10/2023 1143   ALKPHOS 73 01/04/2023 1024   AST 18 10/31/2023 1112   ALT 21 10/31/2023 1112   BILITOT 0.4 10/31/2023 1112   BILITOT 0.3 01/03/2023 1227       The ASCVD Risk score (Arnett DK, et al., 2019) failed to calculate for the following reasons:   The valid systolic blood pressure range is 90 to 200 mmHg  Lab Results  Component Value Date   MICRALBCREAT 517 (H) 02/20/2023   MICRALBCREAT 5 02/02/2022   MICRALBCREAT 3 03/16/2020   MICRALBCREAT 66 (H) 04/07/2019   MICRALBCREAT 11 07/30/2018   MICRALBCREAT 69 (H) 12/18/2017   MICRALBCREAT 31.3 (H) 09/03/2015    A/P: Diabetes currently controlled with a most recent A1c of 6.3% on 01/31/24. Patient is able to verbalize appropriate hypoglycemia management plan. Medication adherence appears appropriate. Fasting BG readings are above goal, but patient attributes this to recent dietary indiscretions with his brother visiting. Patient declined the use of CGM to assist with BG monitoring today. Discussed that [employer] will no longer be offering Ozempic for Medicare beneficiaries and will require LIS denial to continue receiving insulin  products. Will switch to an alternate company at this time given the length of time the application is taking for response. Patient is uncertain if he will be able to continue to receive Ozempic on his insurance, but he has enough for the new year at this time. Discussed that we will likely need to adjust insulin  if he stops GLP1. Will discontinue glipizide  d/t risk of hypoglycemia in combination with short-acting insulin .  -Switched basal insulin  Tresiba  to Basaglar  (insulin  glargine)  20 units daily via [employer] PAP.  -Switched rapid insulin  Novolog  to Humalog (insulin  lispro) per sliding scale via [employer].   -Continued GLP-1 Ozempic (semaglutide) 1 mg weekly.  -Continued SGLT2-I Farxiga  (dapagliflozin ) 10 mg daily.  -Discontinued glipizide  -Continued metformin  metformin  1000 mg BID. Will have to continue to monitor kidney function for dose adjustments. -Extensively discussed pathophysiology of diabetes, recommended lifestyle interventions, dietary effects on blood sugar control.  -Counseled on s/sx of and management of hypoglycemia.  -Next A1c anticipated 04/2024.  -Signed re-enrollment and enrollment paperwork for [employer] and [employer] in clinic.  ASCVD risk - secondary prevention in patient with diabetes. Last LDL is 74 mg/dL, not at goal of <44 mg/dL.  -Continued atorvastatin  40 mg daily.   Hypertension longstanding currently uncontrolled. Blood pressure goal of <130/80 mmHg. Patient declines any symptoms of hypertension. Home BP readings range from 140-190/77-112 mmHg.  Medication adherence appropriate. Given that BP is >20/10 mmHg from goal at both home and clinic, will adjust 2 agents at this time. Will prefer thiazide-like diuretic, as these remain effective in CKD.  -Increased dose of valsartan  to 160 mg daily. -Started chlorthalidone 25 mg daily - Continued metoprolol  succinate 50 mg daily -Continued torsemide  20 mg  daily with an additional tablet on Mon/Wed/Fri - Reviewed long term cardiovascular and renal outcomes of uncontrolled blood pressure - Reviewed appropriate blood pressure monitoring technique and reviewed goal blood pressure. Recommended to check home blood pressure and heart rate at least 2-3x per week -Counseled to present to ED if symptoms present   Patient verbalized understanding of treatment plan. Total time patient counseling 60 minutes.  Follow-up:  Pharmacist on 03/10/24 PCP clinic visit on 05/02/24  Peyton CHARLENA Ferries, PharmD, CPP Clinical Pharmacist [facility] [phone]

## 2024-02-06 ENCOUNTER — Telehealth: Payer: Self-pay

## 2024-02-06 ENCOUNTER — Other Ambulatory Visit (HOSPITAL_COMMUNITY): Payer: Self-pay

## 2024-02-06 NOTE — Telephone Encounter (Signed)
 Received pt portion [employer] Basaglar  abd [employer] Farxiga , fax provider portion [employer] to provider office to sign and date.

## 2024-02-06 NOTE — Telephone Encounter (Signed)
 Also faxed provider portion [employer] filled pap to provider office to sign and date

## 2024-02-07 NOTE — Telephone Encounter (Signed)
 Received provider portion [employer] Farxiga  Today faxed to [employer] along pt portion today.

## 2024-02-07 NOTE — Telephone Encounter (Signed)
 Received provider portion [employer] Basaglar  today mising proof of income.left a HIPAA VM.

## 2024-02-11 NOTE — Telephone Encounter (Signed)
 Faxed income documents to PAP team.  Jd Mccaster E. Marsh, PharmD, CPP Clinical Pharmacist [facility] [phone]

## 2024-02-12 NOTE — Telephone Encounter (Signed)
 Received pt proof of income faxed to [employer] along pt and provider portion and Ins. To [employer] today.

## 2024-02-22 DIAGNOSIS — I10 Essential (primary) hypertension: Secondary | ICD-10-CM

## 2024-02-22 MED ORDER — VALSARTAN 320 MG PO TABS: 320.0000 mg | ORAL_TABLET | Freq: Every day | ORAL | 3 refills | Status: DC

## 2024-02-22 NOTE — Telephone Encounter (Signed)
 Pt made aware and verbalized understanding.   Abigail Bernardino HERO, PA-C to Me (Selected Message)   02/22/24  3:47 PM Please have patient increase his valsartan  to 320 mg daily with a follow up BMET 1 week thereafter.

## 2024-02-22 NOTE — Progress Notes (Signed)
 CADENCE HASLAM                                          MRN: [id number]   02/22/2024   The [facility] Quality Team Specialist reviewed this patient medical record for the purposes of chart review for care gap closure. The following were reviewed: chart review for care gap closure-controlling blood pressure.    [facility] Quality Team

## 2024-03-03 ENCOUNTER — Ambulatory Visit (INDEPENDENT_AMBULATORY_CARE_PROVIDER_SITE_OTHER): Admitting: Vascular Surgery

## 2024-03-04 DIAGNOSIS — I10 Essential (primary) hypertension: Secondary | ICD-10-CM | POA: Diagnosis not present

## 2024-03-05 ENCOUNTER — Ambulatory Visit: Payer: Self-pay | Admitting: Physician Assistant

## 2024-03-05 LAB — BASIC METABOLIC PANEL WITH GFR
BUN/Creatinine Ratio: 25 — ABNORMAL HIGH (ref 10–24)
BUN: 37 mg/dL — ABNORMAL HIGH (ref 8–27)
CO2: 26 mmol/L (ref 20–29)
Calcium: 10.2 mg/dL (ref 8.6–10.2)
Chloride: 97 mmol/L (ref 96–106)
Creatinine, Ser: 1.51 mg/dL — ABNORMAL HIGH (ref 0.76–1.27)
Glucose: 143 mg/dL — ABNORMAL HIGH (ref 70–99)
Potassium: 3.5 mmol/L (ref 3.5–5.2)
Sodium: 141 mmol/L (ref 134–144)
eGFR: 48 mL/min/1.73 — ABNORMAL LOW (ref >59)

## 2024-03-06 DIAGNOSIS — K080 Exfoliation of teeth due to systemic causes: Secondary | ICD-10-CM | POA: Diagnosis not present

## 2024-03-10 ENCOUNTER — Telehealth: Payer: Self-pay | Admitting: Nurse Practitioner

## 2024-03-10 ENCOUNTER — Ambulatory Visit

## 2024-03-10 VITALS — BP 162/75 | HR 87

## 2024-03-10 DIAGNOSIS — Z794 Long term (current) use of insulin: Secondary | ICD-10-CM

## 2024-03-10 DIAGNOSIS — E119 Type 2 diabetes mellitus without complications: Secondary | ICD-10-CM

## 2024-03-10 DIAGNOSIS — I10 Essential (primary) hypertension: Secondary | ICD-10-CM

## 2024-03-10 MED ORDER — METOPROLOL SUCCINATE ER 100 MG PO TB24: 100.0000 mg | ORAL_TABLET | Freq: Every day | ORAL | 3 refills | Status: DC

## 2024-03-10 NOTE — Telephone Encounter (Signed)
 Pt dropped off Handicap placard form for Mliss to fill out. It has been placed in her yellow folder.

## 2024-03-10 NOTE — Progress Notes (Unsigned)
 S:     Reason for visit: ?  Evan Rivas is a 73 y.o. male with a history of diabetes (type 2), who presents today for a follow up diabetes Telephone pharmacotherapy visit.? Pertinent PMH also includes gout, OSA, HLD, cancer of the lip, hyperaldosteronism, diastolic dysfunction, second degree AV block.  Care Team: Primary Care Provider: Gareth Mliss FALCON, FNP  At last visit with PCP on 01/31/24, A1c was improved at 6.3%. BP was elevated in clinic at 158/108 mmHg. Patient declined any medication changes at that time.  At follow up with clinical pharmacist on 02/05/24, BP remained elevated at 226/94 mmHg. Valsartan  was increased to 160 mg daily and patient was started on chlorthalidone  25 mg daily. Patient was switched from Tresiba  to Basaglar  and Novolog  to Humalog  as Novo Nordisk PAP program would require Medicare LIS denial for future enrollments.  Current diabetes medications include: Farxiga  10 mg daily, Ozempic 1 mg weekly, Humalog  sliding scale for breakfast, Basaglar  20 units daily, metformin  1000 mg BID Previous diabetes medications include: Trulicity , glipizide  Current hyperlipidemia medications include: atorvastatin  40 mg daily  Patient reports adherence to taking all medications as prescribed. He uses pill box for   Have you been experiencing any side effects to the medications prescribed? no Do you have any problems obtaining medications due to transportation or finances? no Insurance coverage: [employer]  Current medication access support:  Farxiga  via [employer] Basaglar  and Humalog  via [employer]  Patient denies hypoglycemic events.  Reported home fasting blood sugars: 119-250 mg/dL (859-809 mg/dL) *patient declined CGM  Patient reported dietary habits: Reports recent dietary indiscretions since his brother was visiting Drinks: 4 sodas a day  Hypertension/HFpEF: Current medications: Toprol  XL 50 mg daily, torsemide  20 mg daily with 1 additional on Mon/Wed/Fri,  valsartan  320 mg daily, chlorthalidone  25 mg daily   Medications previously tried: amlodipine  (swelling), spironolactone  (hyperkalemia), hydrochlorothiazide  (hypotension) *takes all BP medications before bed, aside from chlorthalidone   Patient has a validated, automated, upper arm home BP cuff Current blood pressure readings readings: 135-174/88-109 mmHg, 70-95 bpm  Patient denies hypotensive s/sx including dizziness, lightheadedness.  Patient denies hypertensive symptoms including vision changes, palpitations, chest pain, shortness of breath  Patient reports taking his BP medications last night, but did not take chlorthalidone  this AM. DM Prevention:  Statin: Taking; high intensity.?  ACE/ARB: yes; valsartan  History of chronic kidney disease? yes Last urinary albumin/creatinine ratio:  Lab Results  Component Value Date   MICRALBCREAT 517 (H) 02/20/2023   MICRALBCREAT 5 02/02/2022   MICRALBCREAT 3 03/16/2020   MICRALBCREAT 66 (H) 04/07/2019   MICRALBCREAT 11 07/30/2018   MICRALBCREAT 69 (H) 12/18/2017   MICRALBCREAT 31.3 (H) 09/03/2015   Last eye exam:  Lab Results  Component Value Date   HMDIABEYEEXA No Retinopathy 01/17/2024   Lab Results  Component Value Date   HMDIABEYEEXA No Retinopathy 01/17/2024   Last foot exam: 02/02/2022 Tobacco Use:  Tobacco Use: Low Risk  (01/31/2024)   Patient History    Smoking Tobacco Use: Never    Smokeless Tobacco Use: Never    Passive Exposure: Not on file   O:  Vitals:  Wt Readings from Last 3 Encounters:  01/31/24 248 lb (112.5 kg)  12/25/23 248 lb (112.5 kg)  12/10/23 252 lb 3.2 oz (114.4 kg)   BP Readings from Last 3 Encounters:  02/05/24 (!) 226/94  01/31/24 (!) 158/108  12/25/23 (!) 174/100   Pulse Readings from Last 3 Encounters:  02/05/24 76  01/31/24 80  12/25/23 82  Labs:?  Lab Results  Component Value Date   HGBA1C 6.3 (A) 01/31/2024   HGBA1C 7.0 (H) 10/31/2023   HGBA1C 8.1 (A) 08/01/2023   GLUCOSE 143  (H) 03/04/2024   MICRALBCREAT 517 (H) 02/20/2023   MICRALBCREAT 5 02/02/2022   MICRALBCREAT 3 03/16/2020   CREATININE 1.51 (H) 03/04/2024   CREATININE 1.49 (H) 12/10/2023   CREATININE 1.60 (H) 10/31/2023    Lab Results  Component Value Date   CHOL 136 10/31/2023   LDLCALC 74 10/31/2023   LDLCALC 70 07/28/2022   LDLCALC 80 02/02/2022   HDL 34 (L) 10/31/2023   TRIG 180 (H) 10/31/2023   TRIG 232 (H) 07/28/2022   TRIG 211 (H) 02/02/2022   ALT 21 10/31/2023   ALT 29 01/04/2023   AST 18 10/31/2023   AST 19 01/04/2023      Chemistry      Component Value Date/Time   NA 141 03/04/2024 1407   K 3.5 03/04/2024 1407   CL 97 03/04/2024 1407   CO2 26 03/04/2024 1407   BUN 37 (H) 03/04/2024 1407   CREATININE 1.51 (H) 03/04/2024 1407   CREATININE 1.60 (H) 10/31/2023 1112      Component Value Date/Time   CALCIUM  10.2 03/04/2024 1407   ALKPHOS 73 01/04/2023 1024   AST 18 10/31/2023 1112   ALT 21 10/31/2023 1112   BILITOT 0.4 10/31/2023 1112   BILITOT 0.3 01/03/2023 1227       The ASCVD Risk score (Arnett DK, et al., 2019) failed to calculate for the following reasons:   The valid systolic blood pressure range is 90 to 200 mmHg  Lab Results  Component Value Date   MICRALBCREAT 517 (H) 02/20/2023   MICRALBCREAT 5 02/02/2022   MICRALBCREAT 3 03/16/2020   MICRALBCREAT 66 (H) 04/07/2019   MICRALBCREAT 11 07/30/2018   MICRALBCREAT 69 (H) 12/18/2017   MICRALBCREAT 31.3 (H) 09/03/2015    A/P: Diabetes currently controlled with a most recent A1c of 6.3% on 01/31/24. Patient is able to verbalize appropriate hypoglycemia management plan. Medication adherence appears appropriate, aside from Humalog . Patient reports he will only take Humalog  if he checks his BG prior to eating, which is only consistently done with breakfast. Reported typical Humalog  dose of 4-6 units. Discussed in depth the benefit of CGM - patient would like to think on this. Fasting BG remains elevated; will increase  basal insulin  dose. Will also make a set bolus insulin  dose at this time.  -Increased dose of basal insulin  Basaglar  (insulin  glargine)  26 units daily via [employer]i PAP.  -Adjusted dose of rapid insulin  Humalog  (insulin  lispro) to 4 units TID with meals -Continued GLP-1 Ozempic (semaglutide) 1 mg weekly.  -Continued SGLT2-I Farxiga  (dapagliflozin ) 10 mg daily.  -Continued metformin  metformin  1000 mg BID. Will have to continue to monitor kidney function for dose adjustments. -Extensively discussed pathophysiology of diabetes, recommended lifestyle interventions, dietary effects on blood sugar control.  -Counseled on s/sx of and management of hypoglycemia.  -Next A1c anticipated 04/2024.   ASCVD risk - secondary prevention in patient with diabetes. Last LDL is 74 mg/dL, not at goal of <44 mg/dL.  -Continued atorvastatin  40 mg daily.   Hypertension longstanding currently uncontrolled. Blood pressure goal of <130/80 mmHg. Patient declines any symptoms of hypertension. Home BP readings range from 135-174/88-109 mmHg, which is improved since last visit.  Medication adherence appropriate. Patient has had issues with various therapies in the past that limit treatment options. Given hx of heart block, will avoid further titration of  BB therapy at this time. Will start low dose hydralazine  today.  -Started hydralazine  10 mg TID  -Continued valsartan  320 mg daily. -Continued chlorthalidone  25 mg daily - Continued metoprolol  succinate 50 mg daily. Contacted pharmacy to cancel 100 mg daily dose -Continued torsemide  20 mg daily with an additional tablet on Mon/Wed/Fri - Reviewed long term cardiovascular and renal outcomes of uncontrolled blood pressure - Reviewed appropriate blood pressure monitoring technique and reviewed goal blood pressure. Recommended to check home blood pressure and heart rate at least 2-3x per week   Patient verbalized understanding of treatment plan. Total time patient counseling 60  minutes.  Follow-up:  Pharmacist on 05/20/24 PCP clinic visit on 05/02/24  Peyton CHARLENA Ferries, PharmD, CPP Clinical Pharmacist [facility] [phone]

## 2024-03-10 NOTE — Telephone Encounter (Signed)
 [employer] a call to follow up on PAP spoke with representative said PAP has been approved but Humalog  was not add it in the application they also had question on provider portion on Basaglar  ,submitting provider portion on both medication for provider to sign and date.

## 2024-03-10 NOTE — Patient Instructions (Addendum)
 Please increase Tresiba  to 26 units daily Please take Novolog  4 units three times a day with meals Start hydralazine  10 mg three times a day with meals Continue metoprolol  succinate 50 mg daily Continue valsartan  320 mg daily

## 2024-03-11 MED ORDER — METOPROLOL SUCCINATE ER 50 MG PO TB24: 50.0000 mg | ORAL_TABLET | Freq: Every day | ORAL | 3 refills | Status: DC

## 2024-03-11 MED ORDER — HYDRALAZINE HCL 10 MG PO TABS: 10.0000 mg | ORAL_TABLET | Freq: Three times a day (TID) | ORAL | 1 refills | Status: AC

## 2024-03-11 NOTE — Telephone Encounter (Signed)
 Received provider portion [employer] to [employer] today.

## 2024-03-14 NOTE — Telephone Encounter (Signed)
 Faxed pt and provider portion to [employer]

## 2024-03-17 ENCOUNTER — Other Ambulatory Visit: Payer: Self-pay | Admitting: Nurse Practitioner

## 2024-03-17 DIAGNOSIS — E669 Obesity, unspecified: Secondary | ICD-10-CM

## 2024-03-18 NOTE — Telephone Encounter (Signed)
 Received approval letter from [employer] (Humalog ) thru 04/02/2025 approval letter index.

## 2024-03-19 NOTE — Telephone Encounter (Signed)
 No longer listed on current medication list Requested Prescriptions  Pending Prescriptions Disp Refills   glipiZIDE  (GLUCOTROL ) 10 MG tablet [Pharmacy Med Name: GLIPIZIDE  TABS 10MG ] 90 tablet 3    Sig: TAKE 1 TABLET DAILY BEFORE BREAKFAST     Endocrinology:  Diabetes - Sulfonylureas Failed - 03/19/2024 11:14 AM      Failed - Cr in normal range and within 360 days    Creat  Date Value Ref Range Status  10/31/2023 1.60 (H) 0.70 - 1.28 mg/dL Final   Creatinine, Ser  Date Value Ref Range Status  03/04/2024 1.51 (H) 0.76 - 1.27 mg/dL Final   Creatinine, Urine  Date Value Ref Range Status  02/20/2023 101 20 - 320 mg/dL Final         Passed - HBA1C is between 0 and 7.9 and within 180 days    Hemoglobin A1C  Date Value Ref Range Status  01/31/2024 6.3 (A) 4.0 - 5.6 % Final   HbA1c, POC (prediabetic range)  Date Value Ref Range Status  03/25/2018 8.9 (A) 5.7 - 6.4 % Final   HbA1c, POC (controlled diabetic range)  Date Value Ref Range Status  03/25/2018 8.9 (A) 0.0 - 7.0 % Final   HbA1c POC (<> result, manual entry)  Date Value Ref Range Status  03/25/2018 8.9 4.0 - 5.6 % Final   Hgb A1c MFr Bld  Date Value Ref Range Status  10/31/2023 7.0 (H) <5.7 % Final    Comment:    For someone without known diabetes, a hemoglobin A1c value of 6.5% or greater indicates that they may have  diabetes and this should be confirmed with a follow-up  test. . For someone with known diabetes, a value <7% indicates  that their diabetes is well controlled and a value  greater than or equal to 7% indicates suboptimal  control. A1c targets should be individualized based on  duration of diabetes, age, comorbid conditions, and  other considerations. . Currently, no consensus exists regarding use of hemoglobin A1c for diagnosis of diabetes for children. SABRA Amy - Valid encounter within last 6 months    Recent Outpatient Visits           1 month ago Degeneration of intervertebral  disc of lumbar region with discogenic back pain   [facility] Gareth Mliss FALCON, FNP   4 months ago Essential hypertension   [facility] Gareth Mliss FALCON, FNP   7 months ago Aortic atherosclerosis   [facility] Gareth Mliss FALCON, FNP       Future Appointments             In 5 months Hester Alm BROCKS, MD [facility]

## 2024-03-21 NOTE — Progress Notes (Signed)
 Evan Rivas                                          MRN: [id number]   03/21/2024   The VBCI Quality Team Specialist reviewed this patient medical record for the purposes of chart review for care gap closure. The following were reviewed: chart review for care gap closure-controlling blood pressure.    VBCI Quality Team

## 2024-03-31 NOTE — Telephone Encounter (Signed)
 Contacted [employer] regarding re-enrollment status. Per representative, patient is approved for Farxiga  until 04/02/25.  Maurilio Puryear E. Marsh, PharmD, CPP Clinical Pharmacist [facility] [phone]

## 2024-04-14 ENCOUNTER — Other Ambulatory Visit: Payer: Self-pay

## 2024-04-14 ENCOUNTER — Telehealth: Payer: Self-pay | Admitting: Nurse Practitioner

## 2024-04-14 ENCOUNTER — Other Ambulatory Visit: Payer: Self-pay | Admitting: Emergency Medicine

## 2024-04-14 DIAGNOSIS — I10 Essential (primary) hypertension: Secondary | ICD-10-CM

## 2024-04-14 MED ORDER — VALSARTAN 320 MG PO TABS: 320.0000 mg | ORAL_TABLET | Freq: Every day | ORAL | 3 refills | Status: AC

## 2024-04-14 MED ORDER — METOPROLOL SUCCINATE ER 50 MG PO TB24: 50.0000 mg | ORAL_TABLET | Freq: Every day | ORAL | 3 refills | Status: AC

## 2024-04-14 NOTE — Telephone Encounter (Signed)
 metFORMIN  (GLUCOPHAGE ) 1000 MG tablet   hydrALAZINE  (APRESOLINE ) 10 MG tablet

## 2024-04-14 NOTE — Telephone Encounter (Signed)
 atorvastatin  (LIPITOR) 40 MG tablet  ?

## 2024-04-14 NOTE — Telephone Encounter (Signed)
 Per chart no refills needed at this time

## 2024-04-14 NOTE — Telephone Encounter (Signed)
chlorthalidone (HYGROTON) 25 MG tablet

## 2024-04-15 MED ORDER — TORSEMIDE 20 MG PO TABS: ORAL_TABLET | ORAL | 2 refills | Status: AC

## 2024-05-02 ENCOUNTER — Ambulatory Visit: Admitting: Nurse Practitioner

## 2024-05-02 ENCOUNTER — Encounter: Payer: Self-pay | Admitting: Nurse Practitioner

## 2024-05-02 VITALS — BP 148/92 | HR 72 | Temp 98.0°F | Ht 72.0 in | Wt 240.0 lb

## 2024-05-02 DIAGNOSIS — E1169 Type 2 diabetes mellitus with other specified complication: Secondary | ICD-10-CM

## 2024-05-02 DIAGNOSIS — N1831 Chronic kidney disease, stage 3a: Secondary | ICD-10-CM | POA: Diagnosis not present

## 2024-05-02 DIAGNOSIS — E1122 Type 2 diabetes mellitus with diabetic chronic kidney disease: Secondary | ICD-10-CM

## 2024-05-02 DIAGNOSIS — I700 Atherosclerosis of aorta: Secondary | ICD-10-CM

## 2024-05-02 DIAGNOSIS — I10 Essential (primary) hypertension: Secondary | ICD-10-CM

## 2024-05-02 DIAGNOSIS — I5032 Chronic diastolic (congestive) heart failure: Secondary | ICD-10-CM | POA: Diagnosis not present

## 2024-05-02 DIAGNOSIS — E119 Type 2 diabetes mellitus without complications: Secondary | ICD-10-CM

## 2024-05-02 DIAGNOSIS — F1120 Opioid dependence, uncomplicated: Secondary | ICD-10-CM | POA: Diagnosis not present

## 2024-05-02 DIAGNOSIS — M5136 Other intervertebral disc degeneration, lumbar region with discogenic back pain only: Secondary | ICD-10-CM

## 2024-05-02 DIAGNOSIS — E269 Hyperaldosteronism, unspecified: Secondary | ICD-10-CM

## 2024-05-02 DIAGNOSIS — N400 Enlarged prostate without lower urinary tract symptoms: Secondary | ICD-10-CM | POA: Diagnosis not present

## 2024-05-02 DIAGNOSIS — G4733 Obstructive sleep apnea (adult) (pediatric): Secondary | ICD-10-CM | POA: Diagnosis not present

## 2024-05-02 DIAGNOSIS — J301 Allergic rhinitis due to pollen: Secondary | ICD-10-CM | POA: Diagnosis not present

## 2024-05-02 DIAGNOSIS — M545 Low back pain, unspecified: Secondary | ICD-10-CM

## 2024-05-02 DIAGNOSIS — I130 Hypertensive heart and chronic kidney disease with heart failure and stage 1 through stage 4 chronic kidney disease, or unspecified chronic kidney disease: Secondary | ICD-10-CM | POA: Diagnosis not present

## 2024-05-02 DIAGNOSIS — Z1159 Encounter for screening for other viral diseases: Secondary | ICD-10-CM

## 2024-05-02 DIAGNOSIS — Z125 Encounter for screening for malignant neoplasm of prostate: Secondary | ICD-10-CM

## 2024-05-02 MED ORDER — HYDROCODONE-ACETAMINOPHEN 10-325 MG PO TABS: 0.5000 | ORAL_TABLET | Freq: Three times a day (TID) | ORAL | 0 refills | Status: AC | PRN

## 2024-05-02 NOTE — Progress Notes (Signed)
 "  BP (!) 148/92   Pulse 72   Temp 98 F (36.7 C)   Ht 6' (1.829 m)   Wt 240 lb (108.9 kg)   SpO2 99%   BMI 32.55 kg/m    Subjective:    Patient ID: Evan Rivas, male    DOB: 1950/04/28, 74 y.o.   MRN: [id number]  HPI: Evan Rivas is a 74 y.o. male  Chief Complaint  Patient presents with   Medical Management of Chronic Issues   Discussed the use of AI scribe software for clinical note transcription with the patient, who gave verbal consent to proceed.  History of Present Illness Evan Rivas is a 74 year old male with chronic bilateral low back pain and degenerative disc disease who presents for a routine follow-up and Norco refill.  Chronic low back pain and degenerative disc disease - Chronic bilateral low back pain attributed to degenerative disc disease in the lumbar region - Opioid dependent for pain management - Takes Norco 10-325 mg, half to one tablet every eight hours as needed for pain - 30-day supply of 30 tablets requested; prefers 31-day supply to match most months  Hypertension - Takes chlorthalidone  25 mg daily, hydralazine  10 mg three times daily, metoprolol  50 mg daily, and valsartan  320 mg daily - Blood pressure today is 132/98, improved from previous readings - Under cardiology care  Diabetes mellitus type 2 - Managed with Ozempic 1 mg weekly and Farxiga  10 mg daily - Last hemoglobin A1c in October was 6.3 - Blood glucose stable, most recent reading 119 this month  Visual impairment due to cataracts - Difficulty seeing and reading even with glasses - Scheduled for right eye cataract surgery on February 25th and left eye on March 25th - Hopes for improved vision post-surgery  Cardiovascular disease - History of aortic atherosclerosis and heart failure -followed by cardiology  Sleep apnea - Managed with CPAP  Allergic rhinitis - History of allergic rhinitis -stable  Hyperaldosteronism - History of  hyperaldosteronism -stable  Hyperlipidemia - Last lipid panel in July: LDL 74, triglycerides 180 -currently on atorvastatin  40 mg daily  Obesity - Weight 240 pounds - BMI 32.55 -continues to work on lifestyle modification,  on ozempic  Chronic kidney disease - Last checked in July with GFR of 45  Benign prostatic hyperplasia (bph) - History of BPH  Mental health - Good mental health         05/02/2024   11:18 AM 01/31/2024   10:52 AM 10/31/2023   10:50 AM  Depression screen PHQ 2/9  Decreased Interest 0 0 0  Down, Depressed, Hopeless 0 0 0  PHQ - 2 Score 0 0 0  Altered sleeping 0 0 0  Tired, decreased energy 0 0 0  Change in appetite 0 0 0  Feeling bad or failure about yourself  0 0 0  Trouble concentrating 0 0 0  Moving slowly or fidgety/restless 0 0 0  Suicidal thoughts 0 0 0  PHQ-9 Score 0 0  0   Difficult doing work/chores Not difficult at all Not difficult at all      Data saved with a previous flowsheet row definition    Relevant past medical, surgical, family and social history reviewed and updated as indicated. Interim medical history since our last visit reviewed. Allergies and medications reviewed and updated.  Review of Systems  Constitutional: Negative for fever or weight change.  Respiratory: Negative for cough and shortness of breath.   Cardiovascular: Negative  for chest pain or palpitations.  Gastrointestinal: Negative for abdominal pain, no bowel changes.  Musculoskeletal: Negative for gait problem or joint swelling.  Skin: Negative for rash.  Neurological: Negative for dizziness or headache.  No other specific complaints in a complete review of systems (except as listed in HPI above).      Objective:      BP (!) 148/92   Pulse 72   Temp 98 F (36.7 C)   Ht 6' (1.829 m)   Wt 240 lb (108.9 kg)   SpO2 99%   BMI 32.55 kg/m    Wt Readings from Last 3 Encounters:  05/02/24 240 lb (108.9 kg)  01/31/24 248 lb (112.5 kg)  12/25/23 248  lb (112.5 kg)    Physical Exam VITALS: BP- 132/98 MEASUREMENTS: Weight- 240, BMI- 32.55. GENERAL: Alert, cooperative, well developed, no acute distress HEENT: Normocephalic, normal oropharynx, moist mucous membranes CHEST: Clear to auscultation bilaterally, no wheezes, rhonchi, or crackles CARDIOVASCULAR: Normal heart rate and rhythm, S1 and S2 normal without murmurs ABDOMEN: Soft, non-tender, non-distended, without organomegaly, normal bowel sounds EXTREMITIES: No cyanosis or edema NEUROLOGICAL: Cranial nerves grossly intact, moves all extremities without gross motor or sensory deficit  Results for orders placed or performed in visit on 02/22/24  Basic metabolic panel with GFR   Collection Time: 03/04/24  2:07 PM  Result Value Ref Range   Glucose 143 (H) 70 - 99 mg/dL   BUN 37 (H) 8 - 27 mg/dL   Creatinine, Ser 8.48 (H) 0.76 - 1.27 mg/dL   eGFR 48 (L) >40 fO/fpw/8.26   BUN/Creatinine Ratio 25 (H) 10 - 24   Sodium 141 134 - 144 mmol/L   Potassium 3.5 3.5 - 5.2 mmol/L   Chloride 97 96 - 106 mmol/L   CO2 26 20 - 29 mmol/L   Calcium  10.2 8.6 - 10.2 mg/dL          Assessment & Plan:   Problem List Items Addressed This Visit       Cardiovascular and Mediastinum   Essential hypertension - Primary   Relevant Orders   CBC with Differential/Platelet   Comprehensive metabolic panel with GFR   Aortic atherosclerosis   Relevant Orders   Lipid panel   Chronic diastolic heart failure (HCC)     Respiratory   Allergic rhinitis   OSA (obstructive sleep apnea)     Endocrine   Hyperlipidemia associated with type 2 diabetes mellitus (HCC)   Relevant Orders   Comprehensive metabolic panel with GFR   Lipid panel   Type 2 diabetes mellitus with stage 3b chronic kidney disease, without long-term current use of insulin  (HCC)   Relevant Orders   Comprehensive metabolic panel with GFR   Hemoglobin A1c   Microalbumin / creatinine urine ratio   Hyperaldosteronism      Musculoskeletal and Integument   DDD (degenerative disc disease), lumbar   Relevant Medications   HYDROcodone -acetaminophen  (NORCO) 10-325 MG tablet   HYDROcodone -acetaminophen  (NORCO) 10-325 MG tablet (Start on 06/01/2024)   HYDROcodone -acetaminophen  (NORCO) 10-325 MG tablet (Start on 07/01/2024)     Genitourinary   BPH without urinary obstruction   Relevant Orders   PSA   Stage 3a chronic kidney disease (HCC)   Relevant Orders   Comprehensive metabolic panel with GFR     Other   Continuous opioid dependence (HCC)   Relevant Medications   HYDROcodone -acetaminophen  (NORCO) 10-325 MG tablet   HYDROcodone -acetaminophen  (NORCO) 10-325 MG tablet (Start on 06/01/2024)   HYDROcodone -acetaminophen  (NORCO) 10-325 MG tablet (  Start on 07/01/2024)   Morbid obesity (HCC)   Chronic low back pain   Relevant Medications   HYDROcodone -acetaminophen  (NORCO) 10-325 MG tablet   HYDROcodone -acetaminophen  (NORCO) 10-325 MG tablet (Start on 06/01/2024)   HYDROcodone -acetaminophen  (NORCO) 10-325 MG tablet (Start on 07/01/2024)   Other Visit Diagnoses       Encounter for hepatitis C screening test for low risk patient       Relevant Orders   Hepatitis C antibody     Screening for prostate cancer       Relevant Orders   PSA        Assessment and Plan Assessment & Plan Chronic low back pain secondary to lumbar degenerative disc disease Chronic bilateral low back pain without sciatica, managed with Norco. He requests a 31-day supply due to calendar discrepancies. - Prescribed Norco 10-325 mg, half to one tablet every eight hours as needed for pain, with a 31-day supply.  Opioid dependence Managed with Norco for chronic pain.  Hypertension with chronic diastolic heart failure and aortic atherosclerosis Blood pressure is slightly elevated at 132/98 mmHg but improved. Managed with multiple antihypertensives and followed by cardiology. - Rechecked blood pressure. - Ordered labs to assess current  medication efficacy.  Type 2 diabetes mellitus with hyperlipidemia Diabetes is well-controlled with an A1c of 6.3% in October. Hyperlipidemia managed with atorvastatin . Last lipid panel in July showed LDL 74 mg/dL and triglycerides 819 mg/dL. - Ordered labs to monitor diabetes and lipid levels.  Stage 3a chronic kidney disease GFR of 45 mL/min/1.73 m2 as of July 2025. - Ordered microalbumin urine test.  BPH -stable,   getting psa  Morbid obesity BMI of 32.55. - Encourage continuation of lifestyle modifications, including dietary management and regular exercise. -continue to increase physical activity, getting at least 150 min of physical activity a week.  Work on including runner, broadcasting/film/video 2 days a week.  - continue eating at a calorie deficit 2000-2200 cal a day, eating a well balanced diet with whole foods, avoiding processed foods.   Patient is motivated to continue working on lifestyle modification.    Obstructive sleep apnea Managed with CPAP machine.  General Health Maintenance Overdue for hepatitis C screening and PSA test. - Ordered hepatitis C screening. - Ordered PSA test.        Follow up plan: Return in about 3 months (around 07/31/2024) for follow up. "

## 2024-05-03 LAB — CBC WITH DIFFERENTIAL/PLATELET
Absolute Lymphocytes: 2429 {cells}/uL (ref 850–3900)
Absolute Monocytes: 502 {cells}/uL (ref 200–950)
Basophils Absolute: 62 {cells}/uL (ref 0–200)
Basophils Relative: 0.7 %
Eosinophils Absolute: 229 {cells}/uL (ref 15–500)
Eosinophils Relative: 2.6 %
HCT: 45.3 % (ref 39.4–51.1)
Hemoglobin: 15.3 g/dL (ref 13.2–17.1)
MCH: 32.3 pg (ref 27.0–33.0)
MCHC: 33.8 g/dL (ref 31.6–35.4)
MCV: 95.8 fL (ref 81.4–101.7)
MPV: 11.3 fL (ref 7.5–12.5)
Monocytes Relative: 5.7 %
Neutro Abs: 5579 {cells}/uL (ref 1500–7800)
Neutrophils Relative %: 63.4 %
Platelets: 226 10*3/uL (ref 140–400)
RBC: 4.73 Million/uL (ref 4.20–5.80)
RDW: 13.3 % (ref 11.0–15.0)
Total Lymphocyte: 27.6 %
WBC: 8.8 10*3/uL (ref 3.8–10.8)

## 2024-05-03 LAB — COMPREHENSIVE METABOLIC PANEL WITH GFR
AG Ratio: 1.3 (calc) (ref 1.0–2.5)
ALT: 16 U/L (ref 9–46)
AST: 18 U/L (ref 10–35)
Albumin: 4 g/dL (ref 3.6–5.1)
Alkaline phosphatase (APISO): 83 U/L (ref 35–144)
BUN/Creatinine Ratio: 28 (calc) — ABNORMAL HIGH (ref 6–22)
BUN: 34 mg/dL — ABNORMAL HIGH (ref 7–25)
CO2: 33 mmol/L — ABNORMAL HIGH (ref 20–32)
Calcium: 9.9 mg/dL (ref 8.6–10.3)
Chloride: 103 mmol/L (ref 98–110)
Creat: 1.21 mg/dL (ref 0.70–1.28)
Globulin: 3.1 g/dL (ref 1.9–3.7)
Glucose, Bld: 73 mg/dL (ref 65–99)
Potassium: 3.4 mmol/L — ABNORMAL LOW (ref 3.5–5.3)
Sodium: 146 mmol/L (ref 135–146)
Total Bilirubin: 0.5 mg/dL (ref 0.2–1.2)
Total Protein: 7.1 g/dL (ref 6.1–8.1)
eGFR: 63 mL/min/{1.73_m2}

## 2024-05-03 LAB — HEMOGLOBIN A1C
Hgb A1c MFr Bld: 6.9 % — ABNORMAL HIGH
Mean Plasma Glucose: 151 mg/dL
eAG (mmol/L): 8.4 mmol/L

## 2024-05-03 LAB — LIPID PANEL
Cholesterol: 127 mg/dL
HDL: 33 mg/dL — ABNORMAL LOW
LDL Cholesterol (Calc): 70 mg/dL
Non-HDL Cholesterol (Calc): 94 mg/dL
Total CHOL/HDL Ratio: 3.8 (calc)
Triglycerides: 153 mg/dL — ABNORMAL HIGH

## 2024-05-03 LAB — PSA: PSA: 1.19 ng/mL

## 2024-05-03 LAB — MICROALBUMIN / CREATININE URINE RATIO
Creatinine, Urine: 111 mg/dL (ref 20–320)
Microalb Creat Ratio: 103 mg/g{creat} — ABNORMAL HIGH
Microalb, Ur: 11.4 mg/dL

## 2024-05-03 LAB — HEPATITIS C ANTIBODY: Hepatitis C Ab: NONREACTIVE

## 2024-05-05 ENCOUNTER — Ambulatory Visit: Payer: Self-pay | Admitting: Nurse Practitioner

## 2024-05-07 NOTE — Progress Notes (Signed)
 GOVIND FUREY                                          MRN: [id number]   05/07/2024   The VBCI Quality Team Specialist reviewed this patient medical record for the purposes of chart review for care gap closure. The following were reviewed: chart review for care gap closure-controlling blood pressure.    VBCI Quality Team

## 2024-05-20 ENCOUNTER — Ambulatory Visit

## 2024-06-10 ENCOUNTER — Ambulatory Visit: Admitting: Physician Assistant

## 2024-07-29 ENCOUNTER — Ambulatory Visit: Admitting: Nurse Practitioner

## 2024-08-07 ENCOUNTER — Ambulatory Visit

## 2024-09-02 ENCOUNTER — Ambulatory Visit: Admitting: Dermatology
